# Patient Record
Sex: Male | Born: 1950 | Race: White | Hispanic: No | Smoking: Former smoker
Health system: Southern US, Community
[De-identification: ages and names within clinical notes are randomized; demographics above are authoritative.]

## PROBLEM LIST (undated history)

## (undated) DIAGNOSIS — G459 Transient cerebral ischemic attack, unspecified: Secondary | ICD-10-CM

## (undated) DIAGNOSIS — Z87442 Personal history of urinary calculi: Secondary | ICD-10-CM

## (undated) DIAGNOSIS — N1832 Chronic kidney disease, stage 3b: Secondary | ICD-10-CM

## (undated) DIAGNOSIS — Z950 Presence of cardiac pacemaker: Secondary | ICD-10-CM

## (undated) DIAGNOSIS — I10 Essential (primary) hypertension: Secondary | ICD-10-CM

## (undated) DIAGNOSIS — R519 Headache, unspecified: Secondary | ICD-10-CM

## (undated) DIAGNOSIS — E1169 Type 2 diabetes mellitus with other specified complication: Secondary | ICD-10-CM

## (undated) DIAGNOSIS — E119 Type 2 diabetes mellitus without complications: Secondary | ICD-10-CM

## (undated) DIAGNOSIS — Z9181 History of falling: Secondary | ICD-10-CM

## (undated) DIAGNOSIS — M1990 Unspecified osteoarthritis, unspecified site: Secondary | ICD-10-CM

## (undated) DIAGNOSIS — D649 Anemia, unspecified: Secondary | ICD-10-CM

## (undated) DIAGNOSIS — G4730 Sleep apnea, unspecified: Secondary | ICD-10-CM

## (undated) DIAGNOSIS — F419 Anxiety disorder, unspecified: Secondary | ICD-10-CM

## (undated) DIAGNOSIS — F1091 Alcohol use, unspecified, in remission: Secondary | ICD-10-CM

## (undated) DIAGNOSIS — F411 Generalized anxiety disorder: Secondary | ICD-10-CM

## (undated) DIAGNOSIS — K436 Other and unspecified ventral hernia with obstruction, without gangrene: Secondary | ICD-10-CM

## (undated) DIAGNOSIS — C642 Malignant neoplasm of left kidney, except renal pelvis: Secondary | ICD-10-CM

## (undated) DIAGNOSIS — C4491 Basal cell carcinoma of skin, unspecified: Secondary | ICD-10-CM

## (undated) DIAGNOSIS — H9190 Unspecified hearing loss, unspecified ear: Secondary | ICD-10-CM

## (undated) DIAGNOSIS — G629 Polyneuropathy, unspecified: Secondary | ICD-10-CM

## (undated) DIAGNOSIS — Z9981 Dependence on supplemental oxygen: Secondary | ICD-10-CM

## (undated) DIAGNOSIS — F32A Depression, unspecified: Secondary | ICD-10-CM

## (undated) DIAGNOSIS — Z87891 Personal history of nicotine dependence: Secondary | ICD-10-CM

## (undated) DIAGNOSIS — E785 Hyperlipidemia, unspecified: Secondary | ICD-10-CM

## (undated) HISTORY — DX: History of falling: Z91.81

## (undated) HISTORY — DX: Morbid (severe) obesity due to excess calories: E66.01

## (undated) HISTORY — DX: Polyneuropathy, unspecified: G62.9

## (undated) HISTORY — DX: Other and unspecified ventral hernia with obstruction, without gangrene: K43.6

## (undated) HISTORY — DX: Alcohol use, unspecified, in remission: F10.91

## (undated) HISTORY — DX: Type 2 diabetes mellitus with other specified complication: E11.69

## (undated) HISTORY — DX: Anxiety disorder, unspecified: F41.9

## (undated) HISTORY — DX: Essential (primary) hypertension: I10

## (undated) HISTORY — DX: Generalized anxiety disorder: F41.1

## (undated) HISTORY — DX: Sleep apnea, unspecified: G47.30

## (undated) HISTORY — DX: Unspecified hearing loss, unspecified ear: H91.90

## (undated) HISTORY — DX: Type 2 diabetes mellitus without complications: E11.9

## (undated) HISTORY — PX: BREAST SURGERY: SHX581

## (undated) HISTORY — DX: Dependence on supplemental oxygen: Z99.81

## (undated) HISTORY — DX: Type 2 diabetes mellitus with other specified complication: E78.5

## (undated) HISTORY — DX: Depression, unspecified: F32.A

## (undated) HISTORY — DX: Personal history of nicotine dependence: Z87.891

## (undated) HISTORY — PX: KNEE SURGERY: SHX244

---

## 1978-02-19 HISTORY — PX: APPENDECTOMY: SHX54

## 1980-02-20 HISTORY — PX: NASAL SEPTUM SURGERY: SHX37

## 1981-02-19 HISTORY — PX: VARICOCELECTOMY: SHX1084

## 1987-02-20 HISTORY — PX: COLOSTOMY: SHX63

## 1997-02-19 DIAGNOSIS — F1091 Alcohol use, unspecified, in remission: Secondary | ICD-10-CM

## 1997-02-19 DIAGNOSIS — Z87891 Personal history of nicotine dependence: Secondary | ICD-10-CM

## 1997-02-19 HISTORY — DX: Alcohol use, unspecified, in remission: F10.91

## 1997-02-19 HISTORY — DX: Personal history of nicotine dependence: Z87.891

## 1997-06-21 ENCOUNTER — Encounter: Admission: RE | Admit: 1997-06-21 | Discharge: 1997-09-19 | Payer: Self-pay | Admitting: Internal Medicine

## 2003-05-11 ENCOUNTER — Emergency Department (HOSPITAL_COMMUNITY): Admission: EM | Admit: 2003-05-11 | Discharge: 2003-05-12 | Payer: Self-pay | Admitting: Emergency Medicine

## 2003-05-13 ENCOUNTER — Encounter: Payer: Self-pay | Admitting: Urology

## 2003-05-16 ENCOUNTER — Observation Stay (HOSPITAL_COMMUNITY): Admission: EM | Admit: 2003-05-16 | Discharge: 2003-05-17 | Payer: Self-pay | Admitting: Emergency Medicine

## 2005-02-18 ENCOUNTER — Inpatient Hospital Stay (HOSPITAL_COMMUNITY): Admission: EM | Admit: 2005-02-18 | Discharge: 2005-02-19 | Payer: Self-pay | Admitting: Emergency Medicine

## 2008-02-11 ENCOUNTER — Encounter (HOSPITAL_COMMUNITY): Admission: RE | Admit: 2008-02-11 | Discharge: 2008-02-19 | Payer: Self-pay | Admitting: Cardiology

## 2008-02-20 ENCOUNTER — Encounter (HOSPITAL_COMMUNITY): Admission: RE | Admit: 2008-02-20 | Discharge: 2008-02-20 | Payer: Self-pay | Admitting: Cardiology

## 2008-04-08 ENCOUNTER — Ambulatory Visit (HOSPITAL_COMMUNITY): Admission: RE | Admit: 2008-04-08 | Discharge: 2008-04-08 | Payer: Self-pay | Admitting: Urology

## 2008-04-12 ENCOUNTER — Ambulatory Visit (HOSPITAL_COMMUNITY): Admission: RE | Admit: 2008-04-12 | Discharge: 2008-04-12 | Payer: Self-pay | Admitting: Urology

## 2010-03-12 ENCOUNTER — Encounter: Payer: Self-pay | Admitting: Internal Medicine

## 2010-06-06 LAB — GLUCOSE, CAPILLARY: Glucose-Capillary: 160 mg/dL — ABNORMAL HIGH (ref 70–99)

## 2010-06-06 LAB — URINALYSIS, ROUTINE W REFLEX MICROSCOPIC
Bilirubin Urine: NEGATIVE
Hgb urine dipstick: NEGATIVE
Nitrite: NEGATIVE
Specific Gravity, Urine: 1.019 (ref 1.005–1.030)
pH: 7.5 (ref 5.0–8.0)

## 2010-06-06 LAB — CBC
HCT: 37.2 % — ABNORMAL LOW (ref 39.0–52.0)
Hemoglobin: 12.3 g/dL — ABNORMAL LOW (ref 13.0–17.0)
RBC: 4.3 MIL/uL (ref 4.22–5.81)
RDW: 14.9 % (ref 11.5–15.5)
WBC: 5.2 10*3/uL (ref 4.0–10.5)

## 2010-06-06 LAB — URINE MICROSCOPIC-ADD ON

## 2010-06-06 LAB — COMPREHENSIVE METABOLIC PANEL
BUN: 11 mg/dL (ref 6–23)
CO2: 25 mEq/L (ref 19–32)
Chloride: 105 mEq/L (ref 96–112)
Glucose, Bld: 151 mg/dL — ABNORMAL HIGH (ref 70–99)
Potassium: 5.8 mEq/L — ABNORMAL HIGH (ref 3.5–5.1)
Sodium: 135 mEq/L (ref 135–145)
Total Protein: 7 g/dL (ref 6.0–8.3)

## 2010-07-04 NOTE — Op Note (Signed)
NAME:  Jesse [name], Jesse [name]              ACCOUNT NO.:  [id number]   MEDICAL RECORD NO.:  [id number]          PATIENT TYPE:  AMB   LOCATION:  DAY                          FACILITY:  [facility]   PHYSICIAN:  Houston M. Kimbrough, M.D.DATE OF BIRTH:  Nov 21, 1950   DATE OF PROCEDURE:  04/08/2008  DATE OF DISCHARGE:                               OPERATIVE REPORT   PREOPERATIVE DIAGNOSIS:  Large 21 mm stone left UPJ (ureteropelvic  junction) with obstruction.   POSTOPERATIVE DIAGNOSIS:  Large 21 mm stone left UPJ (ureteropelvic  junction) with obstruction.   OPERATION:  Cystoscopy, left retrograde pyelogram, left ureteroscopy  with laser tripsy, left ureteral stent insertion.   ANESTHESIA:  General.   SURGEON:  Corky Downs, M.D.   BRIEF HISTORY:  This 60 year old patient comes in with a large stone at  the left UPJ that was found on a CT scan done a few days ago.  He has  had intermittent left pain.  He had ureteroscopy and laser tripsy by Dr.  Risa Grill in March of 2005.  He has also had lithotripsy 3 or 4 times in  the past.  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  He has an incarcerated left ventral hernia  that is quite large as well.  He enters now for cystoscopy, retrograde  and attempted laser tripsy of the large left UPJ stone and insertion of  a stent.   PROCEDURE IN DETAIL:  The patient was placed on the operating table in  dorsal lithotomy position.  After satisfactory induction of general  endotracheal anesthesia was prepped and draped with Betadine in the  usual sterile fashion and given IV Cipro.  Time-out was then performed  with the patient and the procedure then reidentified and confirmed.  The  21 panendoscope was inserted in the anterior urethra which was normal.  Posterior urethra showed some bilobar prostate enlargement.  The bladder  was entered.  It was carefully inspected.  No lesions were seen.  The  left orifice was  visualized and catheterized with an open-ended ureteral  catheter.   An occlusive retrograde demonstrated normal distal ureter but a large  stone at the AP junction that was quite tight.  I was able to get some  dye through this.   Under fluoroscopy I passed a sensor guidewire 0.38 and I was able to  negotiate this past the stone.  Scope was then removed and the ureter  was then gently dilated with the inner cannula from a ureteral access  sheath under fluoroscopy up to the level of the stone.  The inner  cannula was then pulled back out and loaded over the long ureteral  access sheath and then again under fluoroscopy I was able to gently pass  this up to the stone.  I removed the guidewire and the stylette.   I passed the flexible ureteroscope up to the stone.  I could see the  stone and then with the 200 micron fiber of the Holmium laser I was able  to start working on the stone.  It  was difficult to get very good flow  even with compression and it was hard to direct the laser fiber directly  on the stone.  I was able to chip away at part of the stone with the  fiber but I did not feel it was safe to continue not being able to see  any better than I could.  I removed the flexible cystoscope and passed  another sensor guidewire under fluoroscopy past the stone and then  removed the ureteral access sheath.  The cystoscope was then back loaded  over the guidewire and then a 6 French x 28 cm length double-J ureteral  stent was then passed over the guidewire under fluoroscopy up to the  kidney.  When I removed the guidewire it seemed like the stent was just  was too long and it was kind of coiled up around the stone.  I grasped  the end of the stent, pulled this outside the urethral meatus and then  passed a new guidewire under fluoroscopy again past the stone up into  the kidney.  I removed the old stent.  I back loaded the cystoscope over  the guidewire and this time a 6 French x 26 cm  length double-J ureteral  stent was then passed and it seemed to fit much better.  When the  guidewire was removed there was a coil in the renal pelvis and one also  in the bladder.  The scope was removed after draining the bladder.  The  patient was given 30 mg of Toradol and a B and O suppository.   The patient was taken to the recovery room in good condition and will be  set up for lithotripsy for later next week.  At least with the stent we  will be able to break the stone up and he will not pass all the pieces  at one time. He tolerated the procedure well.  He will be sent home as  an outpatient.      Corky Downs, M.D.  Electronically Signed     HMK/MEDQ  D:  04/08/2008  T:  04/08/2008  Job:  [id number]

## 2010-07-07 NOTE — H&P (Signed)
NAME:  Jesse [name], Jesse [name]              ACCOUNT NO.:  [id number]   MEDICAL RECORD NO.:  [id number]          PATIENT TYPE:  INP   LOCATION:  0103                         FACILITY:  [facility]   PHYSICIAN:  Darrelyn Hillock, MDDATE OF BIRTH:  1950/03/28   DATE OF ADMISSION:  02/17/2005  DATE OF DISCHARGE:                                HISTORY & PHYSICAL   ADMISSION DIAGNOSIS:  Partial small bowel obstruction and ventral hernia  with loss of domain.   ADMITTING PHYSICIAN:  Dr. Janeece Agee.   HISTORY OF PRESENT ILLNESS:  The patient is a 60 year old white male who is  at least 15 or 18 years status post ruptured diverticulitis with [place]  procedure status post colostomy reversal with long-standing ventral hernia,  now with increasing symptoms of nausea and abdominal discomfort.  Abdominal  series is consistent with a partial small bowel obstruction.  The patient  has actually had improvement in his symptoms and abdominal pain while in the  emergency room, however does have continued discomfort.  The patient was  seen in our office about a year ago and told by one of the surgeons that he  had loss of domain of his ventral hernia, really needed to lose a  significant amount of weight prior to attempting ventral hernia repair.   PAST MEDICAL HISTORY:  1.  Obstructive sleep apnea  2.  Noninsulin-dependent diabetes mellitus.  3.  Depression.   PAST SURGICAL HISTORY:  1.  Significant as above.  2.  Foot surgery.  3  Appendectomy.  4  Hartman procedure and reversal of colostomy.   MEDICATIONS:  Aspirin 325 mg a day,  __________ 2 mg q day, Actos 15 mg a  day, metformin 1 g every morning and every evening, Byetta 10 mg a day,  Wellbutrin 400 mg a day, Benicar 20 mg a day, cephalexin 500 mg a day.   PHYSICAL EXAMINATION:  He is an age appropriate white male in minimal  distress.  HEENT:  Benign.  Normocephalic, atraumatic.  Pupils equal, round, and  reactive to light.  LUNGS:   Clear to auscultation and percussion x2.  HEART:  Regular rate and rhythm without murmurs, rubs or gallops.  ABDOMEN:  Morbidly obese with a very large left lower quadrant hernia and  midline scar.  The left lower quadrant hernia, however, is somewhat soft.  The upper portion is somewhat firm with no erythema.  Minimally tender.  The  upper portion is not reducible.  The midline scar is slightly tender as  well.  EXTREMITIES:  No cyanosis, clubbing or edema.   IMPRESSION:  Long-standing ventral hernia with incarceration and mild small  bowel obstruction.   PLAN:  Admission, NG tube decompression, and followup abdominal films.      Darrelyn Hillock, MD  Electronically Signed     KRH/MEDQ  D:  02/18/2005  T:  02/18/2005  Job:  [id number]

## 2010-07-07 NOTE — Op Note (Signed)
NAME:  Jesse [name], Jesse [name]                        ACCOUNT NO.:  [id number]   MEDICAL RECORD NO.:  [id number]                   PATIENT TYPE:  OUT   LOCATION:  DAY                                  FACILITY:  [facility]   PHYSICIAN:  Bernestine Amass, M.D.               DATE OF BIRTH:  04/27/1950   DATE OF PROCEDURE:  05/17/2003  DATE OF DISCHARGE:                                 OPERATIVE REPORT   PREOPERATIVE DIAGNOSES:  Left proximal ureteral calculus.   POSTOPERATIVE DIAGNOSES:  Left proximal ureteral calculus.   PROCEDURE:  Cystoscopy, left retrograde pyelography, left ureteroscopy,  holmium laser lithotripsy, basketing of stone fragments and double J stent  placement.   SURGEON:  Bernestine Amass, M.D.   ANESTHESIA:  Spinal.   INDICATIONS FOR PROCEDURE:  Jesse [name] is a 60 year old male.  He recently  presented with severe left flank pain and was diagnosed with a 6 mm stone  right at his left ureteropelvic junction/UPJ region.  When we saw him in the  office, he was continuing to have discomfort.  A KUB showed a very faint  calcification. The patient is morbidly obese at over 350 pounds and  therefore visualization is somewhat difficult.  A CT had confirmed again a 6  mm stone in that location with moderate hydronephrosis.  The patient  requested intervention as possible.  We initially had hoped to perform  lithotripsy but given the very faint nature of the stone as well as his  significant obesity, we felt it could be very difficult to visualize the  stone.  In addition, there was really no slots available for lithotripsy for  approximately two weeks.  For that reason, we felt attempt at ureteroscopy  with at least stent placement to unobstructed kidney would be prudent. We  planned on doing this as an outpatient today but over the weekend, the  patient had severe pain and had to be admitted by Dr. Gaynelle Arabian for pain  control.  He now presents for definitive treatment.   TECHNIQUE AND FINDINGS:  The patient was brought to the operating room. He  had a successful spinal anesthetic and was placed in lithotomy position,  prepped and draped in the usual manner. The patient had mild to moderate  trilobar hyperplasia with a very prominent and high riding median bar.  The  bladder was otherwise endoscopically unremarkable. Retrograde pyelogram  confirmed a high grade obstruction in the proximal ureter. A Glidewire was  able to be passed beyond this without too much difficulty. I used the inside  portion of an access sheath to provide one step dilation of the distal  ureter.  Once that was accomplished, a long 6 French ureteroscope was  inserted. As we got to the proximal ureter, a 6 mm stone was encountered. We  initially attempted to basket extract it but as we got to the distal ureter,  the stone became too snug.  For that reason, the basket was left in situ with  the stone and we went back up with the ureteroscope and used the holmium  laser fiber to break the stone into 10 or 12 pieces. The stone was very hard  consistent with calcium oscillate monohydrate.  Once the fragments were  broken up, we were able to remove the basket with some remaining pieces. We  then used a new basket to remove 3 or 4 additional pieces that were 3-4 mm  in size.  At the completion of the procedure, we saw no significant residual  fragments within the ureter. Because of the need for dilation as well as the  manipulation, we felt it prudent to leave a  double J stent in for 5-7 days. The Glidewire was confirmed to be in good  position and over the wire we placed a 6 French 24 cm stent without  difficulty. Good position was confirmed.  The bladder was drained. The  patient had a B&O suppository and some lidocaine jelly. He was brought to  the recovery room in stable condition.                                               Bernestine Amass, M.D.    DSG/MEDQ  D:  05/17/2003  T:   05/17/2003  Job:  [id number]

## 2010-07-07 NOTE — Discharge Summary (Signed)
Jesse [name], Jesse [name]              ACCOUNT NO.:  [id number]   MEDICAL RECORD NO.:  [id number]          PATIENT TYPE:  INP   LOCATION:  [id number]                         FACILITY:  [facility]   PHYSICIAN:  Jonne Ply, MD   DATE OF BIRTH:  1950-04-13   DATE OF ADMISSION:  02/17/2005  DATE OF DISCHARGE:  02/19/2005                                 DISCHARGE SUMMARY   ADMISSION DIAGNOSIS:  Small-bowel obstruction with ventral hernia.   DISCHARGE DIAGNOSIS:  Small-bowel obstruction with ventral hernia,  spontaneously improved.   CONDITION ON DISCHARGE:  Good and improved. Follow up is with me p.r.n..   HOSPITAL COURSE:  The patient was admitted with evidence of a partial small-  bowel obstruction and a somewhat incarcerated left lower quadrant ventral  hernia. He had significant loss of domain from this hernia and the length of  time it was in place. The patient was admitted, NG tube was placed.  By the  following morning the patient had complete resolution of his symptoms and no  further firmness, minimal NG output, NG tube was removed.  He was started on  a regular diet and ready for discharge home.      Jonne Ply, MD  Electronically Signed     KRE/MEDQ  D:  03/28/2005  T:  03/28/2005  Job:  [id number]

## 2015-02-20 HISTORY — PX: COLOSTOMY: SHX63

## 2015-02-23 DIAGNOSIS — K56609 Unspecified intestinal obstruction, unspecified as to partial versus complete obstruction: Secondary | ICD-10-CM | POA: Insufficient documentation

## 2015-02-26 DIAGNOSIS — I491 Atrial premature depolarization: Secondary | ICD-10-CM | POA: Insufficient documentation

## 2015-03-07 DIAGNOSIS — D649 Anemia, unspecified: Secondary | ICD-10-CM | POA: Insufficient documentation

## 2015-03-07 DIAGNOSIS — N189 Chronic kidney disease, unspecified: Secondary | ICD-10-CM | POA: Insufficient documentation

## 2015-03-07 DIAGNOSIS — E860 Dehydration: Secondary | ICD-10-CM | POA: Insufficient documentation

## 2015-03-07 DIAGNOSIS — K439 Ventral hernia without obstruction or gangrene: Secondary | ICD-10-CM | POA: Insufficient documentation

## 2015-04-01 DIAGNOSIS — L0291 Cutaneous abscess, unspecified: Secondary | ICD-10-CM | POA: Insufficient documentation

## 2020-11-25 DIAGNOSIS — I890 Lymphedema, not elsewhere classified: Secondary | ICD-10-CM

## 2020-11-25 DIAGNOSIS — N183 Chronic kidney disease, stage 3 unspecified: Secondary | ICD-10-CM

## 2020-11-25 DIAGNOSIS — I1310 Hypertensive heart and chronic kidney disease without heart failure, with stage 1 through stage 4 chronic kidney disease, or unspecified chronic kidney disease: Secondary | ICD-10-CM

## 2020-11-25 DIAGNOSIS — E1151 Type 2 diabetes mellitus with diabetic peripheral angiopathy without gangrene: Secondary | ICD-10-CM

## 2020-11-25 DIAGNOSIS — L97811 Non-pressure chronic ulcer of other part of right lower leg limited to breakdown of skin: Secondary | ICD-10-CM

## 2020-11-25 DIAGNOSIS — E1122 Type 2 diabetes mellitus with diabetic chronic kidney disease: Secondary | ICD-10-CM

## 2020-11-25 DIAGNOSIS — Z4800 Encounter for change or removal of nonsurgical wound dressing: Secondary | ICD-10-CM

## 2020-11-25 DIAGNOSIS — I872 Venous insufficiency (chronic) (peripheral): Secondary | ICD-10-CM

## 2020-12-08 ENCOUNTER — Encounter: Payer: Self-pay | Admitting: Family

## 2020-12-08 ENCOUNTER — Other Ambulatory Visit: Payer: Self-pay

## 2020-12-08 ENCOUNTER — Ambulatory Visit (INDEPENDENT_AMBULATORY_CARE_PROVIDER_SITE_OTHER): Payer: Medicare PPO | Admitting: Family

## 2020-12-08 VITALS — BP 128/60 | HR 62 | Temp 97.5°F | Resp 20 | Ht 72.0 in | Wt 380.0 lb

## 2020-12-08 DIAGNOSIS — Z1159 Encounter for screening for other viral diseases: Secondary | ICD-10-CM

## 2020-12-08 DIAGNOSIS — M25562 Pain in left knee: Secondary | ICD-10-CM

## 2020-12-08 DIAGNOSIS — E1142 Type 2 diabetes mellitus with diabetic polyneuropathy: Secondary | ICD-10-CM

## 2020-12-08 DIAGNOSIS — F411 Generalized anxiety disorder: Secondary | ICD-10-CM

## 2020-12-08 DIAGNOSIS — F321 Major depressive disorder, single episode, moderate: Secondary | ICD-10-CM

## 2020-12-08 DIAGNOSIS — G4733 Obstructive sleep apnea (adult) (pediatric): Secondary | ICD-10-CM

## 2020-12-08 DIAGNOSIS — G8929 Other chronic pain: Secondary | ICD-10-CM

## 2020-12-08 DIAGNOSIS — E785 Hyperlipidemia, unspecified: Secondary | ICD-10-CM

## 2020-12-08 DIAGNOSIS — R0609 Other forms of dyspnea: Secondary | ICD-10-CM

## 2020-12-08 DIAGNOSIS — I129 Hypertensive chronic kidney disease with stage 1 through stage 4 chronic kidney disease, or unspecified chronic kidney disease: Secondary | ICD-10-CM | POA: Diagnosis not present

## 2020-12-08 DIAGNOSIS — E119 Type 2 diabetes mellitus without complications: Secondary | ICD-10-CM | POA: Insufficient documentation

## 2020-12-08 DIAGNOSIS — F5101 Primary insomnia: Secondary | ICD-10-CM

## 2020-12-08 DIAGNOSIS — F1091 Alcohol use, unspecified, in remission: Secondary | ICD-10-CM

## 2020-12-08 DIAGNOSIS — Z87891 Personal history of nicotine dependence: Secondary | ICD-10-CM

## 2020-12-08 DIAGNOSIS — R2681 Unsteadiness on feet: Secondary | ICD-10-CM

## 2020-12-08 DIAGNOSIS — N183 Chronic kidney disease, stage 3 unspecified: Secondary | ICD-10-CM

## 2020-12-08 DIAGNOSIS — E1169 Type 2 diabetes mellitus with other specified complication: Secondary | ICD-10-CM | POA: Diagnosis not present

## 2020-12-08 DIAGNOSIS — F419 Anxiety disorder, unspecified: Secondary | ICD-10-CM | POA: Insufficient documentation

## 2020-12-08 DIAGNOSIS — Z7689 Persons encountering health services in other specified circumstances: Secondary | ICD-10-CM

## 2020-12-08 DIAGNOSIS — G63 Polyneuropathy in diseases classified elsewhere: Secondary | ICD-10-CM

## 2020-12-08 DIAGNOSIS — M25561 Pain in right knee: Secondary | ICD-10-CM

## 2020-12-08 DIAGNOSIS — R600 Localized edema: Secondary | ICD-10-CM

## 2020-12-08 DIAGNOSIS — H903 Sensorineural hearing loss, bilateral: Secondary | ICD-10-CM

## 2020-12-08 DIAGNOSIS — Z933 Colostomy status: Secondary | ICD-10-CM

## 2020-12-08 DIAGNOSIS — L989 Disorder of the skin and subcutaneous tissue, unspecified: Secondary | ICD-10-CM

## 2020-12-08 DIAGNOSIS — H9190 Unspecified hearing loss, unspecified ear: Secondary | ICD-10-CM | POA: Insufficient documentation

## 2020-12-08 NOTE — Progress Notes (Addendum)
Provider: Marlowe Sax FNP-C   Jahmai Finelli, Nelda Bucks, NP  Patient Care Team: Shanti Agresti, Nelda Bucks, NP as PCP - General (Family Medicine)  Extended Emergency Contact Information Primary Emergency Contact: Reasner,MICHAELINE W Address: [place], [place] [address] Johnnette Litter of [place] Phone: [phone] Mobile Phone: [phone] Relation: Friend Secondary Emergency Contact: Harada,[name] Mobile Phone: [phone] Relation: Other Preferred language: English Interpreter needed? No  Code Status:  Full Code  Goals of care: Advanced Directive information Advanced Directives 12/08/2020  Does Patient Have a Medical Advance Directive? Yes  Type of Paramedic of [place];Living will  Does patient want to make changes to medical advance directive? No - Patient declined  Copy of [place] in Chart? No - copy requested     Chief Complaint  Patient presents with   Establish Care    New Patient.    HPI:  Pt is a 70 y.o. male seen today to establish care here at [facility], systems for medical management of chronic diseases.Has medical history Type 2 DM with peripheral Neuropathy,Hypertension, CKD stage 3 a,Hyperlipidemia,sleep Apnea,Peripheral Neuropathy,Oxygen dependent ,Generalized Anxiety disorder, Major Depression, Morbid Obesity,Hear of hearing,Fall risk ,Former cigarette smoker,Alcohol use in remission,Lymphedema,severe cleft foot  among others.  States has blisters that opened up on left leg wound managed by [facility] dressing changed twice per week. He uses continuous  oxygen 2 liters via nasal cannula. Gets supplies from [place].  Aortic root  - 5.0 in the past but now measures 4.9   Hypertension - blood pressure reading are in the 120/70's denies any headache,dizziness,vision changes,fatigue,chest tightness,palpitation.occasional chest pain on isobid.   Type 2 DM - states  A1C 5.9 done few weeks ago.does not check blood sugar on a regular basis.Has not had any hypoglycemia.  Hyperlipidemia - latest LDL was normal few weeks ago.will need to obtain previous medical records to evaluate labs.  Sleep Apnea - wears CPAP with 14 pressure and oxygen   Lower extremities edema - chronic lymphedema   Generalized Anxiety - stable   Depression - has been on Cymbalta has been effective   Smoked 1 pack per day for 10 yrs quit in 1999  Used to drink alcohol 1/5 day last drink 1999  Does little exercise due to shortness of breath on exertion.  Insomnia - sometimes does not sleep until 3 am.Takes Alprazolam which has been effective.  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   He will require DME power wheelchair to allow him to maintain current level of independence with his ADL's which cannot be achieved with standard wheelchair,walker or cane.Scooter is not an option for patient due to inability to transfer on and off given his morbid obesity very high risk for falls.Patient suffers from Morbid obesity,Lymphedema,chronic pain on both knees-Osteoarthritis ,dyspnea on exertion which impairs his ability to perform daily activities like bathing,walking,dressing,grooming and toileting in the home.He is alert and oriented and capbable of operating power wheelchair by himself.  Fall and safety precautions.   Past Medical History:  Diagnosis Date   Alcohol use disorder in remission    Anxiety and depression    At high risk for falls    Former cigarette smoker    Generalized anxiety disorder    Hard of hearing    Hyperlipidemia due to type 2 diabetes mellitus ([place])  Hypertension    Morbid obesity ([place])    Neuropathy    O2 dependent    Risk for falls    Sleep apnea    Type 2 diabetes mellitus ([place])    Past Surgical History:  Procedure Laterality Date   APPENDECTOMY  1980   KNEE SURGERY Right      Allergies  Allergen Reactions   Tape     Allergies as of 12/08/2020       Reactions   Tape         Medication List        Accurate as of December 08, 2020 11:59 PM. If you have any questions, ask your nurse or doctor.          ALPRAZolam 0.5 MG tablet Commonly known as: XANAX Take 0.5 mg by mouth daily as needed.   aspirin EC 81 MG tablet Take 81 mg by mouth daily. Swallow whole.   bumetanide 1 MG tablet Commonly known as: BUMEX Take 1 tablet by mouth in the morning, at noon, in the evening, and at bedtime.   cyanocobalamin 1000 MCG tablet Take 1,000 mcg by mouth daily.   DULoxetine 60 MG capsule Commonly known as: CYMBALTA Take 60 mg by mouth daily.   empagliflozin 25 MG Tabs tablet Commonly known as: JARDIANCE Take 25 mg by mouth daily.   IMODIUM PO Take 1 capsule by mouth daily.   ketoconazole 2 % cream Commonly known as: NIZORAL Apply 1 application topically as needed for irritation.   liraglutide 18 MG/3ML Sopn Commonly known as: VICTOZA Inject 1.8 mg into the skin daily.   metFORMIN 500 MG tablet Commonly known as: GLUCOPHAGE Take 500 mg by mouth 2 (two) times daily with a meal.   metolazone 2.5 MG tablet Commonly known as: ZAROXOLYN Take 2.5 mg by mouth 2 (two) times a week.   pioglitazone 45 MG tablet Commonly known as: ACTOS Take 1 tablet by mouth daily.   RED YEAST RICE PO Take by mouth daily.   rosuvastatin 10 MG tablet Commonly known as: CRESTOR Take 1 tablet by mouth daily.   topiramate 25 MG tablet Commonly known as: TOPAMAX Take 25 mg by mouth daily.   traMADol 50 MG tablet Commonly known as: ULTRAM Take 50 mg by mouth 2 (two) times daily as needed.   traZODone 50 MG tablet Commonly known as: DESYREL Take 1 tablet by mouth 3 (three) times daily.   TURMERIC PO Take 150 mg by mouth 2 (two) times daily.   valsartan 40 MG tablet Commonly known as: DIOVAN Take 40 mg by mouth daily.                Durable Medical Equipment  (From admission, onward)           Start     Ordered   12/08/20 0000  For home use only DME Other see comment       Comments: Power wheelchair uses daily for mobility  Question:  Length of Need  Answer:  Lifetime   12/08/20 1445            Review of Systems  Constitutional:  Positive for fatigue. Negative for appetite change, chills, fever and unexpected weight change.  HENT:  Positive for hearing loss. Negative for congestion, dental problem, ear discharge, ear pain, facial swelling, nosebleeds, postnasal drip, rhinorrhea, sinus pressure, sinus pain, sneezing, sore throat, tinnitus and trouble swallowing.        Dry mouth   Eyes:  Positive  for visual disturbance. Negative for pain, discharge, redness and itching.       Wears corrective lens   Respiratory:  Positive for shortness of breath. Negative for cough, chest tightness and wheezing.        Chronic shortness of breath on oxygen 2 liters  Uses CPAP 14 pressure with oxygen   Cardiovascular:  Positive for leg swelling. Negative for chest pain and palpitations.  Gastrointestinal:  Negative for abdominal distention, abdominal pain, blood in stool, constipation, diarrhea, nausea and vomiting.  Endocrine: Negative for cold intolerance, heat intolerance, polydipsia, polyphagia and polyuria.  Genitourinary:  Positive for frequency. Negative for difficulty urinating, dysuria, flank pain and urgency.       Frequency voiding due to diuretics  Musculoskeletal:  Positive for arthralgias and joint swelling. Negative for back pain, gait problem, myalgias, neck pain and neck stiffness.       Deformities on feet   Skin:  Positive for rash. Negative for color change, pallor and wound.       Had basal cell 3 X  Itchy skin  Bruising with slight petechiae   Neurological:  Positive for headaches. Negative for dizziness, syncope, speech difficulty, weakness, light-headedness and numbness.       Chronic headaches    Hematological:  Does not bruise/bleed easily.  Psychiatric/Behavioral:  Positive for sleep disturbance. Negative for agitation, behavioral problems, confusion, hallucinations, self-injury and suicidal ideas. The patient is nervous/anxious.        Depression    Immunization History  Administered Date(s) Administered   H1N1 01/24/2008   Moderna Sars-Covid-2 Vaccination 04/03/2019, 05/05/2019, 12/20/2019, 05/23/2020   Zoster Recombinat (Shingrix) 03/01/2020   Pertinent  Health Maintenance Due  Topic Date Due   HEMOGLOBIN A1C  Never done   FOOT EXAM  Never done   OPHTHALMOLOGY EXAM  Never done   INFLUENZA VACCINE  Never done   COLONOSCOPY (Pts 45-54yr Insurance coverage will need to be confirmed)  Discontinued   Fall Risk  12/08/2020  Falls in the past year? 0  Number falls in past yr: 0  Injury with Fall? 0  Risk for fall due to : No Fall Risks  Follow up Falls evaluation completed   Functional Status Survey:    Vitals:   12/08/20 1330  BP: 128/60  Pulse: 62  Resp: 20  Temp: (!) 97.5 F (36.4 C)  SpO2: 98%  Weight: (!) 380 lb (172.4 kg)  Height: 6' (1.829 m)   Body mass index is 51.54 kg/m. Physical Exam Vitals reviewed.  Constitutional:      General: He is not in acute distress.    Appearance: Normal appearance. He is morbidly obese. He is not ill-appearing or diaphoretic.  HENT:     Head: Normocephalic.     Right Ear: Tympanic membrane, ear canal and external ear normal. There is no impacted cerumen.     Left Ear: Tympanic membrane, ear canal and external ear normal. There is no impacted cerumen.     Ears:     Comments: Hard of hearing.Bilateral Hearing aids in place     Nose: Nose normal. No congestion or rhinorrhea.     Mouth/Throat:     Mouth: Mucous membranes are moist.     Pharynx: Oropharynx is clear. No oropharyngeal exudate or posterior oropharyngeal erythema.  Eyes:     General: No scleral icterus.       Right eye: No discharge.        Left eye:  No discharge.  Extraocular Movements: Extraocular movements intact.     Conjunctiva/sclera: Conjunctivae normal.     Pupils: Pupils are equal, round, and reactive to light.  Neck:     Vascular: No carotid bruit.  Cardiovascular:     Rate and Rhythm: Normal rate and regular rhythm.     Pulses: Normal pulses.     Heart sounds: Normal heart sounds. No murmur heard.   No friction rub. No gallop.  Pulmonary:     Effort: Pulmonary effort is normal. No respiratory distress.     Breath sounds: Normal breath sounds. No wheezing, rhonchi or rales.  Chest:     Chest wall: No tenderness.  Abdominal:     General: Bowel sounds are normal. There is no distension.     Palpations: Abdomen is soft. There is no mass.     Tenderness: There is no abdominal tenderness. There is no right CVA tenderness, left CVA tenderness, guarding or rebound.  Musculoskeletal:        General: No swelling or tenderness. Normal range of motion.     Cervical back: Normal range of motion. No rigidity or tenderness.     Right lower leg: Edema present.     Left lower leg: Edema present.     Right foot: Deformity present.     Left foot: Deformity present.  Lymphadenopathy:     Cervical: No cervical adenopathy.  Skin:    General: Skin is warm and dry.     Coloration: Skin is not pale.     Findings: No bruising, erythema or rash.     Comments: Skin lesion on the back   Neurological:     Mental Status: He is alert and oriented to person, place, and time.     Cranial Nerves: No cranial nerve deficit.     Sensory: No sensory deficit.     Motor: No weakness.     Coordination: Coordination normal.     Gait: Gait abnormal.  Psychiatric:        Mood and Affect: Mood normal.        Speech: Speech normal.        Behavior: Behavior normal.        Thought Content: Thought content normal.        Judgment: Judgment normal.    Labs reviewed: No results for input(s): NA, K, CL, CO2, GLUCOSE, BUN, CREATININE, CALCIUM, MG,  PHOS in the last 8760 hours. No results for input(s): AST, ALT, ALKPHOS, BILITOT, PROT, ALBUMIN in the last 8760 hours. No results for input(s): WBC, NEUTROABS, HGB, HCT, MCV, PLT in the last 8760 hours. No results found for: TSH No results found for: HGBA1C No results found for: CHOL, HDL, LDLCALC, LDLDIRECT, TRIG, CHOLHDL  Significant Diagnostic Results in last 30 days:  No results found.  Assessment/Plan 1. Diabetic peripheral neuropathy (HCC) No CBG log for evaluation reports latest Hgb A 1 C was 5.9  Reports no signs of hypo/hyperglycemia.will obtain medical records to evaluate labs  Continue on Victoza, Jardiance ,Actos and Metformin  - continue on ASA and Statin for cardiac event prophylaxis -on Valsartan for renal protection  - on ACE inhibitor for renal protection - up to date on annual eye - will need referral annual foot exam  - CBC with Differential/Platelet; Future - CMP with eGFR([facility]); Future - Hemoglobin A1c; Future - Ambulatory referral to Podiatry  2. Benign hypertension with CKD (chronic kidney disease) stage III (HCC) B/p at goal  Continue on Valsartan and Bumex  -  CBC with Differential/Platelet; Future - CMP with eGFR([facility]); Future - TSH; Future  3. Hyperlipidemia due to type 2 diabetes mellitus ([place]) No labs for review but reports had recent lab work done. Continue on Rosuvastatin  - Lipid panel; Future  4. Polyneuropathy associated with underlying disease ([place]) Continue on valsartan   5. Generalized anxiety disorder Stable - continue on alprazolam  - Non-Opoid use contract signed today    6. Current moderate episode of major depressive disorder, unspecified whether recurrent (HCC) Mood stable  - continue on Duloxetine   7. Obstructive sleep apnea syndrome wears CPAP with 14 pressure and with continuous oxygen   8. Morbid obesity ([place]) - Dietary modification and exercise at least 3 times per week for 30 minutes advised. Requires power  wheelchair due to difficulties with ambulation  - For home use only DME Other see comment: Power wheelchair   9. Unsteady gait Fall and safety precaution  - For home use only DME Other see comment: Power wheelchair ordered   10. Sensorineural hearing loss (SNHL) of both ears Bilateral Hearing Aids in place   11. Presence of colostomy (HCC) Chronic  Patent   12. Former cigarette smoker Smoked one per pack per day for 10 years quit 1999  13. Alcohol use disorder in remission Drunk 1 per 5 days quit 1999  - continue to monitor  14. Establishing care with new doctor, encounter for No medical records for review of immunization,lab work and medical history.Has signed release of information from previous PCP then will update records.reports had recent fasting lab work no labs required today.   15. Primary insomnia Continue on Trazodone   16. Encounter for hepatitis C screening test for low risk patient Low risk  - Hep C Antibody; Future  17. Edema of both lower extremities [facility] Nurse to continue Ulna boots - For home use only DME Other see comment - continue on Bumetadine and  Metolazone   18. Chronic pain of both knees Continue on tramadol  Use contract signed today   19.  Dyspnea on exertion  Chronic due to morbid obesity and lower extremities edema.will require Power wheelchair to allow him to maintain current level of independence with his ADL's which cannot be achieved with standard wheelchair,walker or cane.Scooter is not an option for patient due to inability to transfer on and off given his morbid obesity very high risk for falls.He is alert and oriented and able to operate power wheelchair and use it within the home.  - continue on Bumetadine and  Metolazone   20. Skin lesion of back Hx of basal cell cancer  - Ambulatory referral to Dermatology  Family/ staff Communication: Reviewed plan of care with patient verbalized understanding   Labs/tests  ordered:  - CBC with Differential/Platelet - CMP with eGFR([facility]) - TSH - Hgb A1C - Lipid panel - Hep C Antibody  Next Appointment : 4 months for medical management of chronic issues with Fasting Labs prior to visit    [name], NP

## 2020-12-12 ENCOUNTER — Telehealth: Payer: Self-pay | Admitting: *Deleted

## 2020-12-12 NOTE — Telephone Encounter (Signed)
Received fax from Andria Rhein from [place] requesting Power Wheelchair documentation and Standard Written Order to be faxed to: [phone].  Placed Paperwork and Order in [place] folder to review and sign. To be faxed back once completed.

## 2020-12-13 ENCOUNTER — Telehealth: Payer: Self-pay | Admitting: Family

## 2020-12-13 NOTE — Telephone Encounter (Signed)
Mr Triggs called requesting 2 referrals: Dermatologist for an area on his back that's itchy  Podiatry for an area on toe that's causing him pain.  Thanks, Vilinda Blanks.

## 2020-12-15 NOTE — Telephone Encounter (Signed)
Ordered

## 2020-12-16 ENCOUNTER — Telehealth: Payer: Self-pay | Admitting: *Deleted

## 2020-12-16 NOTE — Telephone Encounter (Signed)
[facility] Notified.

## 2020-12-16 NOTE — Telephone Encounter (Signed)
Jesse [name] with [facility] called requesting verbal orders for Skilled Nursing 2X4weeks for Leg Wraps.   Is this ok Please Advise.

## 2020-12-16 NOTE — Telephone Encounter (Signed)
Renee with [facility] called and stated that she just wanted to let you know that patient fell off of his Rollator yesterday.   Stated that he did not hit his head. Stated that he has some bruising and pain on his left hip, elbow and shoulder.   EMS did come and help patient up and assessed him.    FYI

## 2020-12-16 NOTE — Telephone Encounter (Signed)
Okay to give home health verbal orders.

## 2020-12-20 NOTE — Telephone Encounter (Signed)
[name] signed the Standard Written Order for patient's Power [facility] Chair.  Faxed the Order along with the OV notes to Fax [phone] [facility]

## 2020-12-21 ENCOUNTER — Telehealth: Payer: Self-pay | Admitting: Family

## 2020-12-21 NOTE — Telephone Encounter (Signed)
   BURNEY CALZADILLA DOB: 1950/12/04 MRN: [id number]   RIDER WAIVER AND RELEASE OF LIABILITY  For purposes of improving physical access to our facilities, [facility] is pleased to partner with third parties to provide [facility] [place] patients or other authorized individuals the option of convenient, on-demand ground transportation services (the Technical brewer") through use of the technology service that enables users to request on-demand ground transportation from independent third-party providers.  By opting to use and accept these [employer], I, the undersigned, hereby agree on behalf of myself, and on behalf of any minor child using the Government social research officer for whom I am the parent or legal guardian, as follows:  Government social research officer provided to me are provided by independent third-party transportation providers who are not [employer] or employees and who are unaffiliated with [employer]. [facility] is neither a transportation carrier nor a common or public carrier. [facility] has no control over the quality or safety of the transportation that occurs as a result of the [employer]. [facility] cannot guarantee that any third-party transportation provider will complete any arranged transportation service. [facility] makes no representation, warranty, or guarantee regarding the reliability, timeliness, quality, safety, suitability, or availability of any of the Transport Services or that they will be error free. I fully understand that traveling by vehicle involves risks and dangers of serious bodily injury, including permanent disability, paralysis, and death. I agree, on behalf of myself and on behalf of any minor child using the Transport Services for whom I am the parent or legal guardian, that the entire risk arising out of my use of the [employer] remains solely with me, to the maximum extent permitted under applicable law. The [employer] are provided "as  is" and "as available." [facility] disclaims all representations and warranties, express, implied or statutory, not expressly set out in these terms, including the implied warranties of merchantability and fitness for a particular purpose. I hereby waive and release [facility], its agents, employees, officers, directors, representatives, insurers, attorneys, assigns, successors, subsidiaries, and affiliates from any and all past, present, or future claims, demands, liabilities, actions, causes of action, or suits of any kind directly or indirectly arising from acceptance and use of the [employer]. I further waive and release [facility] and its affiliates from all present and future liability and responsibility for any injury or death to persons or damages to property caused by or related to the use of the [employer]. I have read this Waiver and Release of Liability, and I understand the terms used in it and their legal significance. This Waiver is freely and voluntarily given with the understanding that my right (as well as the right of any minor child for whom I am the parent or legal guardian using the [employer]) to legal recourse against  in connection with the [employer] is knowingly surrendered in return for use of these services.   I attest that I read the consent document to Maryellen Pile, gave Mr. Witucki the opportunity to ask questions and answered the questions asked (if any). I affirm that Maryellen Pile then provided consent for he's participation in this program.     Legrand Pitts

## 2020-12-23 ENCOUNTER — Ambulatory Visit: Payer: Medicare PPO | Admitting: Podiatry

## 2020-12-23 ENCOUNTER — Other Ambulatory Visit: Payer: Self-pay

## 2020-12-23 DIAGNOSIS — L600 Ingrowing nail: Secondary | ICD-10-CM

## 2020-12-28 NOTE — Progress Notes (Signed)
Subjective:  Patient ID: Jesse [name], male    DOB: 06/15/1950,  MRN: [id number]  Chief Complaint  Patient presents with   Nail Problem    painful bilateral hallux toes;possible spur      70 y.o. male presents with the above complaint.  Patient presents with complaint bilateral hallux medial border ingrown.  Patient states is painful to touch.  Patient would like to have it removed.  It has progressed to gotten worse.  There are some swelling and some redness associated with it.  He denies any other acute complaints.   Review of Systems: Negative except as noted in the HPI. Denies N/V/F/Ch.  Past Medical History:  Diagnosis Date   Alcohol use disorder in remission    Anxiety and depression    At high risk for falls    Former cigarette smoker    Generalized anxiety disorder    Hard of hearing    Hyperlipidemia due to type 2 diabetes mellitus ([place])    Hypertension    Morbid obesity ([place])    Neuropathy    O2 dependent    Risk for falls    Sleep apnea    Type 2 diabetes mellitus ([place])     Current Outpatient Medications:    ALPRAZolam (XANAX) 0.5 MG tablet, Take 0.5 mg by mouth daily as needed., Disp: , Rfl:    aspirin EC 81 MG tablet, Take 81 mg by mouth daily. Swallow whole., Disp: , Rfl:    bumetanide (BUMEX) 1 MG tablet, Take 1 tablet by mouth in the morning, at noon, in the evening, and at bedtime., Disp: , Rfl:    cyanocobalamin 1000 MCG tablet, Take 1,000 mcg by mouth daily., Disp: , Rfl:    DULoxetine (CYMBALTA) 60 MG capsule, Take 60 mg by mouth daily., Disp: , Rfl:    empagliflozin (JARDIANCE) 25 MG TABS tablet, Take 25 mg by mouth daily., Disp: , Rfl:    ketoconazole (NIZORAL) 2 % cream, Apply 1 application topically as needed for irritation., Disp: , Rfl:    liraglutide (VICTOZA) 18 MG/3ML SOPN, Inject 1.8 mg into the skin daily., Disp: , Rfl:    Loperamide HCl (IMODIUM PO), Take 1 capsule by mouth daily., Disp: , Rfl:    metFORMIN (GLUCOPHAGE) 500 MG tablet,  Take 500 mg by mouth 2 (two) times daily with a meal., Disp: , Rfl:    metolazone (ZAROXOLYN) 2.5 MG tablet, Take 2.5 mg by mouth 2 (two) times a week., Disp: , Rfl:    pioglitazone (ACTOS) 45 MG tablet, Take 1 tablet by mouth daily., Disp: , Rfl:    Red Yeast Rice Extract (RED YEAST RICE PO), Take by mouth daily., Disp: , Rfl:    rosuvastatin (CRESTOR) 10 MG tablet, Take 1 tablet by mouth daily., Disp: , Rfl:    topiramate (TOPAMAX) 25 MG tablet, Take 25 mg by mouth daily., Disp: , Rfl:    traMADol (ULTRAM) 50 MG tablet, Take 50 mg by mouth 2 (two) times daily as needed., Disp: , Rfl:    traZODone (DESYREL) 50 MG tablet, Take 1 tablet by mouth 3 (three) times daily., Disp: , Rfl:    TURMERIC PO, Take 150 mg by mouth 2 (two) times daily., Disp: , Rfl:    valsartan (DIOVAN) 40 MG tablet, Take 40 mg by mouth daily., Disp: , Rfl:   Social History   Tobacco Use  Smoking Status Former   Packs/day: 1.00   Types: Cigarettes   Quit date: 1999  Years since quitting: 23.8  Smokeless Tobacco Never    Allergies  Allergen Reactions   Tape    Objective:  There were no vitals filed for this visit. There is no height or weight on file to calculate BMI. Constitutional Well developed. Well nourished.  Vascular Dorsalis pedis pulses palpable bilaterally. Posterior tibial pulses palpable bilaterally. Capillary refill normal to all digits.  No cyanosis or clubbing noted. Pedal hair growth normal.  Neurologic Normal speech. Oriented to person, place, and time. Epicritic sensation to light touch grossly present bilaterally.  Dermatologic Painful ingrowing nail at medial nail borders of the hallux nail bilaterally. No other open wounds. No skin lesions.  Orthopedic: Normal joint ROM without pain or crepitus bilaterally. No visible deformities. No bony tenderness.   Radiographs: None Assessment:   1. Ingrown toenail of right foot   2. Ingrown left big toenail    Plan:  Patient was  evaluated and treated and all questions answered.  Ingrown Nail, bilaterally -Patient elects to proceed with minor surgery to remove ingrown toenail removal today. Consent reviewed and signed by patient. -Ingrown nail excised. See procedure note. -Educated on post-procedure care including soaking. Written instructions provided and reviewed. -Patient to follow up in 2 weeks for nail check.  Procedure: Excision of Ingrown Toenail Location: Bilateral 1st toe medial nail borders. Anesthesia: Lidocaine 1% plain; 1.5 mL and Marcaine 0.5% plain; 1.5 mL, digital block. Skin Prep: Betadine. Dressing: Silvadene; telfa; dry, sterile, compression dressing. Technique: Following skin prep, the toe was exsanguinated and a tourniquet was secured at the base of the toe. The affected nail border was freed, split with a nail splitter, and excised. Chemical matrixectomy was then performed with phenol and irrigated out with alcohol. The tourniquet was then removed and sterile dressing applied. Disposition: Patient tolerated procedure well. Patient to return in 2 weeks for follow-up.   No follow-ups on file.

## 2021-01-03 ENCOUNTER — Telehealth: Payer: Self-pay | Admitting: *Deleted

## 2021-01-03 NOTE — Telephone Encounter (Signed)
Robin with [facility] called requesting verbal orders for PT/OT to evaluate and Treat.  Verbal orders given.

## 2021-03-10 ENCOUNTER — Other Ambulatory Visit: Payer: Self-pay

## 2021-03-10 ENCOUNTER — Other Ambulatory Visit: Payer: Medicare PPO

## 2021-03-10 DIAGNOSIS — N183 Chronic kidney disease, stage 3 unspecified: Secondary | ICD-10-CM

## 2021-03-10 DIAGNOSIS — I129 Hypertensive chronic kidney disease with stage 1 through stage 4 chronic kidney disease, or unspecified chronic kidney disease: Secondary | ICD-10-CM

## 2021-03-10 DIAGNOSIS — E785 Hyperlipidemia, unspecified: Secondary | ICD-10-CM

## 2021-03-10 DIAGNOSIS — Z1159 Encounter for screening for other viral diseases: Secondary | ICD-10-CM

## 2021-03-10 DIAGNOSIS — E1142 Type 2 diabetes mellitus with diabetic polyneuropathy: Secondary | ICD-10-CM

## 2021-03-10 DIAGNOSIS — E1169 Type 2 diabetes mellitus with other specified complication: Secondary | ICD-10-CM

## 2021-03-13 LAB — COMPLETE METABOLIC PANEL WITH GFR
AG Ratio: 1.4 (calc) (ref 1.0–2.5)
ALT: 9 U/L (ref 9–46)
AST: 14 U/L (ref 10–35)
Albumin: 3.9 g/dL (ref 3.6–5.1)
Alkaline phosphatase (APISO): 57 U/L (ref 35–144)
BUN/Creatinine Ratio: 13 (calc) (ref 6–22)
BUN: 24 mg/dL (ref 7–25)
CO2: 29 mmol/L (ref 20–32)
Calcium: 8.7 mg/dL (ref 8.6–10.3)
Chloride: 109 mmol/L (ref 98–110)
Creat: 1.79 mg/dL — ABNORMAL HIGH (ref 0.70–1.28)
Globulin: 2.8 g/dL (calc) (ref 1.9–3.7)
Glucose, Bld: 77 mg/dL (ref 65–99)
Potassium: 5.4 mmol/L — ABNORMAL HIGH (ref 3.5–5.3)
Sodium: 139 mmol/L (ref 135–146)
Total Bilirubin: 0.5 mg/dL (ref 0.2–1.2)
Total Protein: 6.7 g/dL (ref 6.1–8.1)
eGFR: 40 mL/min/{1.73_m2} — ABNORMAL LOW (ref >=60)

## 2021-03-13 LAB — CBC WITH DIFFERENTIAL/PLATELET
Absolute Monocytes: 456 cells/uL (ref 200–950)
Basophils Absolute: 41 cells/uL (ref 0–200)
Basophils Relative: 1.2 %
Eosinophils Absolute: 214 cells/uL (ref 15–500)
Eosinophils Relative: 6.3 %
HCT: 33.7 % — ABNORMAL LOW (ref 38.5–50.0)
Hemoglobin: 10.3 g/dL — ABNORMAL LOW (ref 13.2–17.1)
Lymphs Abs: 748 cells/uL — ABNORMAL LOW (ref 850–3900)
MCH: 29.8 pg (ref 27.0–33.0)
MCHC: 30.6 g/dL — ABNORMAL LOW (ref 32.0–36.0)
MCV: 97.4 fL (ref 80.0–100.0)
MPV: 9.6 fL (ref 7.5–12.5)
Monocytes Relative: 13.4 %
Neutro Abs: 1941 cells/uL (ref 1500–7800)
Neutrophils Relative %: 57.1 %
Platelets: 130 10*3/uL — ABNORMAL LOW (ref 140–400)
RBC: 3.46 10*6/uL — ABNORMAL LOW (ref 4.20–5.80)
RDW: 13.8 % (ref 11.0–15.0)
Total Lymphocyte: 22 %
WBC: 3.4 10*3/uL — ABNORMAL LOW (ref 3.8–10.8)

## 2021-03-13 LAB — LIPID PANEL
Cholesterol: 94 mg/dL (ref <200)
HDL: 39 mg/dL — ABNORMAL LOW (ref >=40)
LDL Cholesterol (Calc): 38 mg/dL (calc)
Non-HDL Cholesterol (Calc): 55 mg/dL (calc) (ref <130)
Total CHOL/HDL Ratio: 2.4 (calc) (ref <5.0)
Triglycerides: 83 mg/dL (ref <150)

## 2021-03-13 LAB — HEMOGLOBIN A1C
Hgb A1c MFr Bld: 5.6 % of total Hgb (ref <5.7)
Mean Plasma Glucose: 114 mg/dL
eAG (mmol/L): 6.3 mmol/L

## 2021-03-13 LAB — HEPATITIS C ANTIBODY
Hepatitis C Ab: NONREACTIVE
SIGNAL TO CUT-OFF: 0.09 (ref <1.00)

## 2021-03-13 LAB — TSH: TSH: 1.99 mIU/L (ref 0.40–4.50)

## 2021-03-16 ENCOUNTER — Other Ambulatory Visit: Payer: Self-pay

## 2021-03-16 ENCOUNTER — Ambulatory Visit: Payer: Medicare PPO | Admitting: Family

## 2021-03-16 VITALS — BP 126/72 | HR 88 | Temp 97.2°F | Ht 72.0 in

## 2021-03-16 DIAGNOSIS — M25562 Pain in left knee: Secondary | ICD-10-CM

## 2021-03-16 DIAGNOSIS — R600 Localized edema: Secondary | ICD-10-CM

## 2021-03-16 DIAGNOSIS — F411 Generalized anxiety disorder: Secondary | ICD-10-CM

## 2021-03-16 DIAGNOSIS — E1169 Type 2 diabetes mellitus with other specified complication: Secondary | ICD-10-CM | POA: Diagnosis not present

## 2021-03-16 DIAGNOSIS — E1142 Type 2 diabetes mellitus with diabetic polyneuropathy: Secondary | ICD-10-CM | POA: Diagnosis not present

## 2021-03-16 DIAGNOSIS — E785 Hyperlipidemia, unspecified: Secondary | ICD-10-CM

## 2021-03-16 DIAGNOSIS — I129 Hypertensive chronic kidney disease with stage 1 through stage 4 chronic kidney disease, or unspecified chronic kidney disease: Secondary | ICD-10-CM

## 2021-03-16 DIAGNOSIS — E1122 Type 2 diabetes mellitus with diabetic chronic kidney disease: Secondary | ICD-10-CM

## 2021-03-16 DIAGNOSIS — F5101 Primary insomnia: Secondary | ICD-10-CM

## 2021-03-16 DIAGNOSIS — B379 Candidiasis, unspecified: Secondary | ICD-10-CM

## 2021-03-16 DIAGNOSIS — F321 Major depressive disorder, single episode, moderate: Secondary | ICD-10-CM | POA: Diagnosis not present

## 2021-03-16 DIAGNOSIS — G4733 Obstructive sleep apnea (adult) (pediatric): Secondary | ICD-10-CM

## 2021-03-16 DIAGNOSIS — G8929 Other chronic pain: Secondary | ICD-10-CM

## 2021-03-16 DIAGNOSIS — N183 Chronic kidney disease, stage 3 unspecified: Secondary | ICD-10-CM

## 2021-03-16 DIAGNOSIS — M25561 Pain in right knee: Secondary | ICD-10-CM

## 2021-03-16 DIAGNOSIS — R2681 Unsteadiness on feet: Secondary | ICD-10-CM

## 2021-03-16 MED ORDER — METFORMIN HCL 500 MG PO TABS: 500.0000 mg | ORAL_TABLET | Freq: Every day | ORAL | 0 refills | Status: DC

## 2021-03-16 MED ORDER — VITAMIN C 500 MG PO TABS: 250.0000 mg | ORAL_TABLET | Freq: Every day | ORAL | 0 refills | Status: AC

## 2021-03-16 MED ORDER — NYSTATIN 100000 UNIT/GM EX POWD: Freq: Two times a day (BID) | CUTANEOUS | 3 refills | Status: AC

## 2021-03-16 MED ORDER — TRAMADOL HCL 50 MG PO TABS: 50.0000 mg | ORAL_TABLET | Freq: Two times a day (BID) | ORAL | 0 refills | Status: DC | PRN

## 2021-03-16 MED ORDER — ALPRAZOLAM 0.5 MG PO TABS: 0.5000 mg | ORAL_TABLET | Freq: Every day | ORAL | 5 refills | Status: DC | PRN

## 2021-03-16 MED ORDER — KETOCONAZOLE 2 % EX CREA: 1.0000 "application " | TOPICAL_CREAM | CUTANEOUS | 5 refills | Status: DC | PRN

## 2021-03-16 NOTE — Progress Notes (Signed)
Provider: Marlowe Sax FNP-C   Minnie Shi, Nelda Bucks, NP  Patient Care Team: Jadrien Narine, Nelda Bucks, NP as PCP - General (Family Medicine)  Extended Emergency Contact Information Primary Emergency Contact: Stull,MICHAELINE W Address: [place], [place] [address] Johnnette Litter of [place] Phone: [phone] Mobile Phone: [phone] Relation: Friend Secondary Emergency Contact: Zylstra,[name] Mobile Phone: [phone] Relation: Other Preferred language: English Interpreter needed? No  Code Status:  Full Code  Goals of care: Advanced Directive information Advanced Directives 12/08/2020  Does Patient Have a Medical Advance Directive? Yes  Type of Paramedic of [place];Living will  Does patient want to make changes to medical advance directive? No - Patient declined  Copy of [place] in Chart? No - copy requested     Chief Complaint  Patient presents with   Medical Management of Chronic Issues    Medical Management of Chronic Issues. 3 Month Follow up. Requesting Rx for Nystop    HPI:  Pt is a 71 y.o. male seen today for 3 months follow up for medical management of chronic diseases.he denies any acute issues today.   Labs reviewed and discussed today. Hgb A 1 C under control 5.6  HDL 39 CR 1.79 GFR 40 previous 1.60  WBC 3.4 ,Hgb 10.3 previous 12.3 ,plts 130  Had power wheelchair ordered 3 months ago but states will be arriving next week.current has a borrowed power wheelchair.  No fall episode though states accidentally hit his foot on the wall did not have any shoes on.Has bruise on right great toenail and blood blister on second toe.denies any drainage,fever or chills   Has podiatrist that goes to the facility where he resides and would like to have his foot care done in the facility. He request referral to Opthalmology for eye exam. Due for 5 th COVID-19 booster vaccine   Continue to require tramadol for  chronic pain on both knees unable to stand for prolong period of time.Working with Physical therapy weekly.   Has been sleeping a lot during the day though usually goes to bed sometimes at three in the morning and keeps the TV on throughout Takes Trazodone few minutes before he goes to bed.    Past Medical History:  Diagnosis Date   Alcohol use disorder in remission    Anxiety and depression    At high risk for falls    Former cigarette smoker    Generalized anxiety disorder    Hard of hearing    Hyperlipidemia due to type 2 diabetes mellitus ([place])    Hypertension    Morbid obesity ([place])    Neuropathy    O2 dependent    Risk for falls    Sleep apnea    Type 2 diabetes mellitus ([place])    Past Surgical History:  Procedure Laterality Date   APPENDECTOMY  1980   KNEE SURGERY Right     Allergies  Allergen Reactions   Tape     Allergies as of 03/16/2021       Reactions   Tape         Medication List        Accurate as of March 16, 2021  1:11 PM. If you have any questions, ask your nurse or doctor.          ALPRAZolam 0.5 MG tablet Commonly known as: XANAX Take 0.5 mg by mouth daily as needed.   aspirin EC  81 MG tablet Take 81 mg by mouth daily. Swallow whole.   bumetanide 1 MG tablet Commonly known as: BUMEX Take 1 tablet by mouth in the morning, at noon, in the evening, and at bedtime.   cyanocobalamin 1000 MCG tablet Take 1,000 mcg by mouth daily.   DULoxetine 60 MG capsule Commonly known as: CYMBALTA Take 60 mg by mouth daily.   empagliflozin 25 MG Tabs tablet Commonly known as: JARDIANCE Take 25 mg by mouth daily.   IMODIUM PO Take 1 capsule by mouth daily.   ketoconazole 2 % cream Commonly known as: NIZORAL Apply 1 application topically as needed for irritation.   liraglutide 18 MG/3ML Sopn Commonly known as: VICTOZA Inject 1.8 mg into the skin daily.   metFORMIN 500 MG tablet Commonly known as: GLUCOPHAGE Take 500 mg by mouth 2  (two) times daily with a meal.   metolazone 2.5 MG tablet Commonly known as: ZAROXOLYN Take 2.5 mg by mouth 2 (two) times a week.   pioglitazone 45 MG tablet Commonly known as: ACTOS Take 1 tablet by mouth daily.   RED YEAST RICE PO Take by mouth daily.   rosuvastatin 10 MG tablet Commonly known as: CRESTOR Take 1 tablet by mouth daily.   topiramate 25 MG tablet Commonly known as: TOPAMAX Take 25 mg by mouth daily.   traMADol 50 MG tablet Commonly known as: ULTRAM Take 50 mg by mouth 2 (two) times daily as needed.   traZODone 50 MG tablet Commonly known as: DESYREL Take 1 tablet by mouth 3 (three) times daily.   TURMERIC PO Take 150 mg by mouth 2 (two) times daily.   valsartan 40 MG tablet Commonly known as: DIOVAN Take 40 mg by mouth daily.        Review of Systems  Constitutional:  Negative for appetite change, chills, fatigue, fever and unexpected weight change.  HENT:  Positive for hearing loss. Negative for congestion, dental problem, ear discharge, ear pain, facial swelling, nosebleeds, postnasal drip, rhinorrhea, sinus pressure, sinus pain, sneezing, sore throat, tinnitus and trouble swallowing.   Eyes:  Positive for visual disturbance. Negative for pain, discharge, redness and itching.  Respiratory:  Negative for cough, chest tightness and wheezing.        Chronic shortness of breath on oxygen 2 liters  CPAP at night   Cardiovascular:  Positive for leg swelling. Negative for chest pain and palpitations.  Gastrointestinal:  Negative for abdominal distention, abdominal pain, blood in stool, constipation, diarrhea, nausea and vomiting.       Colostomy  Endocrine: Negative for cold intolerance, heat intolerance, polydipsia, polyphagia and polyuria.  Genitourinary:  Negative for difficulty urinating, dysuria, flank pain, frequency and urgency.  Musculoskeletal:  Positive for arthralgias, gait problem and joint swelling. Negative for back pain, myalgias, neck  pain and neck stiffness.  Skin:  Negative for color change, pallor, rash and wound.  Neurological:  Negative for dizziness, syncope, speech difficulty, weakness, light-headedness and numbness.       Chronic headache  Hematological:  Does not bruise/bleed easily.  Psychiatric/Behavioral:  Negative for agitation, behavioral problems, confusion, hallucinations, self-injury, sleep disturbance and suicidal ideas. The patient is nervous/anxious.    Immunization History  Administered Date(s) Administered   Fluad Quad(high Dose 65+) 11/19/2020   H1N1 01/24/2008   Moderna Sars-Covid-2 Vaccination 04/03/2019, 05/05/2019, 12/20/2019, 05/23/2020   Zoster Recombinat (Shingrix) 03/01/2020   Pertinent  Health Maintenance Due  Topic Date Due   FOOT EXAM  Never done   OPHTHALMOLOGY EXAM  Never done   HEMOGLOBIN A1C  09/07/2021   INFLUENZA VACCINE  Completed   COLONOSCOPY (Pts 45-71yr Insurance coverage will need to be confirmed)  Discontinued   Fall Risk 12/08/2020  Falls in the past year? 0  Was there an injury with Fall? 0  Fall Risk Category Calculator 0  Fall Risk Category Low  Patient Fall Risk Level Low fall risk  Patient at Risk for Falls Due to No Fall Risks  Fall risk Follow up Falls evaluation completed   Functional Status Survey:    Vitals:   03/16/21 1258  BP: 126/72  Pulse: 88  Temp: (!) 97.2 F (36.2 C)  TempSrc: Skin  SpO2: 96%  Height: 6' (1.829 m)   Body mass index is 51.54 kg/m. Physical Exam Vitals reviewed.  Constitutional:      General: He is not in acute distress.    Appearance: Normal appearance. He is normal weight. He is not ill-appearing or diaphoretic.  HENT:     Head: Normocephalic.     Right Ear: Tympanic membrane, ear canal and external ear normal. There is no impacted cerumen.     Left Ear: Tympanic membrane, ear canal and external ear normal. There is no impacted cerumen.     Ears:     Comments: Bilateral hearing aids     Nose: Nose normal. No  congestion or rhinorrhea.     Mouth/Throat:     Mouth: Mucous membranes are moist.     Pharynx: Oropharynx is clear. No oropharyngeal exudate or posterior oropharyngeal erythema.  Eyes:     General: No scleral icterus.       Right eye: No discharge.        Left eye: No discharge.     Extraocular Movements: Extraocular movements intact.     Conjunctiva/sclera: Conjunctivae normal.     Pupils: Pupils are equal, round, and reactive to light.  Neck:     Vascular: No carotid bruit.  Cardiovascular:     Rate and Rhythm: Normal rate and regular rhythm.     Pulses: Normal pulses.     Heart sounds: Normal heart sounds. No murmur heard.   No friction rub. No gallop.  Pulmonary:     Effort: Pulmonary effort is normal. No respiratory distress.     Breath sounds: Normal breath sounds. No wheezing, rhonchi or rales.  Chest:     Chest wall: No tenderness.  Abdominal:     General: Bowel sounds are normal. There is no distension.     Palpations: Abdomen is soft. There is no mass.     Tenderness: There is no abdominal tenderness. There is no right CVA tenderness, left CVA tenderness, guarding or rebound.     Comments: Right colostomy patent surrounding skin tissue without any erythema  Musculoskeletal:        General: No swelling or tenderness. Normal range of motion.     Cervical back: Normal range of motion. No rigidity or tenderness.     Right lower leg: Edema present.     Left lower leg: Edema present.     Right foot: Normal capillary refill. Deformity present. No tenderness or crepitus. Normal pulse.     Left foot: Normal capillary refill. Deformity present. No tenderness or crepitus. Normal pulse.     Comments: On power Wheelchair   Lymphadenopathy:     Cervical: No cervical adenopathy.  Skin:    General: Skin is warm and dry.     Coloration: Skin is not pale.  Findings: No bruising, erythema, lesion or rash.  Neurological:     Mental Status: He is alert and oriented to person,  place, and time.     Cranial Nerves: No cranial nerve deficit.     Sensory: No sensory deficit.     Motor: No weakness.     Coordination: Coordination normal.     Gait: Gait abnormal.  Psychiatric:        Mood and Affect: Mood normal.        Speech: Speech normal.        Behavior: Behavior normal.        Thought Content: Thought content normal.        Judgment: Judgment normal.    Labs reviewed: Recent Labs    03/10/21 1002  NA 139  K 5.4*  CL 109  CO2 29  GLUCOSE 77  BUN 24  CREATININE 1.79*  CALCIUM 8.7   Recent Labs    03/10/21 1002  AST 14  ALT 9  BILITOT 0.5  PROT 6.7   Recent Labs    03/10/21 1002  WBC 3.4*  NEUTROABS 1,941  HGB 10.3*  HCT 33.7*  MCV 97.4  PLT 130*   Lab Results  Component Value Date   TSH 1.99 03/10/2021   Lab Results  Component Value Date   HGBA1C 5.6 03/10/2021   Lab Results  Component Value Date   CHOL 94 03/10/2021   HDL 39 (L) 03/10/2021   LDLCALC 38 03/10/2021   TRIG 83 03/10/2021   CHOLHDL 2.4 03/10/2021    Significant Diagnostic Results in last 30 days:  No results found.  Assessment/Plan 1. Benign hypertension with CKD (chronic kidney disease) stage III (HCC) B/p well controlled  Continue on Valsartan  -  continue on ASA and Statin for cardiac event prophylaxis - CBC with Differential/Platelet; Future - CMP with eGFR([facility]); Future  2. Diabetic peripheral neuropathy (HCC) Continue on Valsartan  - traMADol (ULTRAM) 50 MG tablet; Take 1 tablet (50 mg total) by mouth 2 (two) times daily as needed.  Dispense: 60 tablet; Refill: 0  3. Hyperlipidemia due to type 2 diabetes mellitus (HCC) LDL at goal  Continue dietary modification  - continue on rosuvastatin  - Lipid panel; Future  4. Current moderate episode of major depressive disorder, unspecified whether recurrent (HCC) Mood stable.Has adjusted well to the facility where he resides.Participates in facility activities.   5. Generalized anxiety  disorder Xanax effective  - ALPRAZolam (XANAX) 0.5 MG tablet; Take 1 tablet (0.5 mg total) by mouth daily as needed.  Dispense: 30 tablet; Refill: 5  6. Primary insomnia Advised to go to be early at least 2 hrs before midnight to avoid day sleepiness. Sleep hygiene advised to turn off TV and other electronics at least one hour before bedtime.  7. Obstructive sleep apnea syndrome Continue CPAP  8. Type 2 diabetes mellitus with stage 3 chronic kidney disease, without long-term current use of insulin, unspecified whether stage 3a or 3b CKD ([place]) Lab Results  Component Value Date   HGBA1C 5.6 03/10/2021  No home CBG brought to visit but reports CBG have been normal none above 200's  Decrease Metformin from 500 mg twice daily to once daily. - continue on Victoza,Jardiance and actos  Continue to monitor CBG - Ambulatory referral to Ophthalmology - metFORMIN (GLUCOPHAGE) 500 MG tablet; Take 1 tablet (500 mg total) by mouth daily with breakfast.  Dispense: 90 tablet; Refill: 0 - TSH; Future  9. Chronic pain of both knees  PDMP reviewed. Tramadol e-send to pharmacy.  - traMADol (ULTRAM) 50 MG tablet; Take 1 tablet (50 mg total) by mouth 2 (two) times daily as needed.  Dispense: 60 tablet; Refill: 0  10. Edema of both lower extremities Stable  - continue on Bumex and metolazone  No abrupt weight gain ,SOB  11. Unsteady gait Awaiting power wheelchair ordered on previous visit   12. Candidiasis Request refill  - ketoconazole (NIZORAL) 2 % cream; Apply 1 application topically as needed for irritation.  Dispense: 15 g; Refill: 5 - nystatin powder; Apply topically 2 (two) times daily.  Dispense: 60 g; Refill: 3  Family/ staff Communication: Reviewed plan of care with patient verbalized understanding   Labs/tests ordered:  - CBC with Differential/Platelet - CMP with eGFR([facility]) - TSH - Hgb A1C - Lipid panel  Next Appointment : 3 months for medical management of chronic issues.Fasting  Labs prior to visit.    [name], NP

## 2021-03-21 ENCOUNTER — Telehealth: Payer: Self-pay | Admitting: *Deleted

## 2021-03-21 NOTE — Telephone Encounter (Signed)
Received Paperwork from [facility] [phone] Fax: [phone]  CMN for Oxygen Concentrator filled out and placed in Jesse [name]'s folder to review and sign.  To be faxed back to Fax: [phone] once completed.   Confirmed paperwork with patient.

## 2021-03-21 NOTE — Telephone Encounter (Signed)
[name] reviewed and sign.  Faxed

## 2021-03-21 NOTE — Telephone Encounter (Signed)
Received [employer] Patient Referral Order Form.  Patient requested because he is switching companies.  Placed form in [name]'s folder to review, fill out and sign. To be faxed back to Antony Contras #[phone]

## 2021-03-21 NOTE — Telephone Encounter (Signed)
Paperwork signed and faxed.

## 2021-03-21 NOTE — Telephone Encounter (Signed)
Patient called and stated that he is switching Oxygen companies to [employer] to get Portable Concentrator.   Stated that the company will be faxing paperwork for [name] to sign.  Awaiting paperwork.

## 2021-03-22 ENCOUNTER — Telehealth: Payer: Self-pay | Admitting: *Deleted

## 2021-03-22 NOTE — Telephone Encounter (Signed)
Received Prior Authorization from [facility] [phone] for Nystop Powder Filled out PA and placed in [name]'s folder to review and sign.  To be faxed back to [facility] once completed to Fax:[phone]  Member YK:[id number] Awaiting Determination.

## 2021-04-04 ENCOUNTER — Other Ambulatory Visit: Payer: Self-pay | Admitting: Family

## 2021-04-05 NOTE — Telephone Encounter (Signed)
Patient has request refill on medication "Levemir". This medication isnt part of patient medication list. Medication pend and sent to PCP Ngetich, Nelda Bucks, NP for approval.

## 2021-04-06 ENCOUNTER — Other Ambulatory Visit: Payer: Self-pay

## 2021-04-06 MED ORDER — LEVEMIR FLEXPEN 100 UNIT/ML ~~LOC~~ SOPN: 50.0000 [IU] | PEN_INJECTOR | Freq: Every day | SUBCUTANEOUS | 3 refills | Status: DC

## 2021-04-06 NOTE — Telephone Encounter (Signed)
Incoming fax received from [facility] [address] stating Levemir flextouch had been discontinued- They have been switched to Levemir Flexpen. Please resend using new product as they are not interchangeable  Medication list updated, rx pended for provider to review and approve

## 2021-04-11 ENCOUNTER — Telehealth: Payer: Self-pay | Admitting: *Deleted

## 2021-04-11 NOTE — Telephone Encounter (Signed)
Patient just called and stated that his blood sugar has been running high all day. Stated that it was 196 and he took extra insulin and he just checked it again and it was 222. Stated that he is NOT feeling well at all and very shaky.  Stated that he wanted to let you know that his family is coming to take him to the ER.   FYI

## 2021-04-12 ENCOUNTER — Other Ambulatory Visit: Payer: Self-pay | Admitting: Family

## 2021-04-12 DIAGNOSIS — G8929 Other chronic pain: Secondary | ICD-10-CM

## 2021-04-12 DIAGNOSIS — M25562 Pain in left knee: Secondary | ICD-10-CM

## 2021-04-12 DIAGNOSIS — E1142 Type 2 diabetes mellitus with diabetic polyneuropathy: Secondary | ICD-10-CM

## 2021-04-12 MED ORDER — GLUCOSE BLOOD VI STRP: ORAL_STRIP | 3 refills | Status: DC

## 2021-04-12 MED ORDER — ONETOUCH ULTRA MINI W/DEVICE KIT: PACK | 0 refills | Status: DC

## 2021-04-12 NOTE — Telephone Encounter (Signed)
Fax prescription one touch ultra Mini  glucometer to check blood sugar once daily and as needed for signs hypoglycemia

## 2021-04-12 NOTE — Telephone Encounter (Signed)
Patient called and stated that he was Feeling MUCH better. Stated that he called EMS yesterday and they came out and evaluated. Stated that they took his blood sugar and it showed that his machine was way off.   Patient requesting a Rx for a new meter. Stated that he is currently using One Touch Ultra Mini and checking once daily. Stated that his machine is old.   New meter sent to pharmacy.

## 2021-04-12 NOTE — Telephone Encounter (Signed)
Patient has request refill on medication "Tramadol". Patient last refill dated 03/16/2021. Patient has Non Opioid Contract on file dated 01/09/2021. Medication pend and sent to PCP Ngetich, Nelda Bucks, NP for approval.

## 2021-05-02 ENCOUNTER — Other Ambulatory Visit: Payer: Self-pay | Admitting: Family

## 2021-05-12 ENCOUNTER — Emergency Department (HOSPITAL_BASED_OUTPATIENT_CLINIC_OR_DEPARTMENT_OTHER): Payer: Medicare PPO

## 2021-05-12 ENCOUNTER — Other Ambulatory Visit: Payer: Self-pay

## 2021-05-12 ENCOUNTER — Inpatient Hospital Stay (HOSPITAL_BASED_OUTPATIENT_CLINIC_OR_DEPARTMENT_OTHER)
Admission: EM | Admit: 2021-05-12 | Discharge: 2021-05-21 | DRG: 291 | Disposition: A | Discharge status: Home Health | Payer: Medicare PPO | Attending: Internal Medicine | Admitting: Internal Medicine

## 2021-05-12 ENCOUNTER — Encounter (HOSPITAL_BASED_OUTPATIENT_CLINIC_OR_DEPARTMENT_OTHER): Payer: Self-pay | Admitting: Obstetrics and Gynecology

## 2021-05-12 ENCOUNTER — Telehealth: Payer: Self-pay | Admitting: *Deleted

## 2021-05-12 DIAGNOSIS — I2510 Atherosclerotic heart disease of native coronary artery without angina pectoris: Secondary | ICD-10-CM | POA: Diagnosis present

## 2021-05-12 DIAGNOSIS — Z7982 Long term (current) use of aspirin: Secondary | ICD-10-CM

## 2021-05-12 DIAGNOSIS — J9811 Atelectasis: Secondary | ICD-10-CM | POA: Diagnosis present

## 2021-05-12 DIAGNOSIS — F411 Generalized anxiety disorder: Secondary | ICD-10-CM | POA: Diagnosis present

## 2021-05-12 DIAGNOSIS — Z9981 Dependence on supplemental oxygen: Secondary | ICD-10-CM

## 2021-05-12 DIAGNOSIS — F1011 Alcohol abuse, in remission: Secondary | ICD-10-CM | POA: Diagnosis present

## 2021-05-12 DIAGNOSIS — Z79899 Other long term (current) drug therapy: Secondary | ICD-10-CM

## 2021-05-12 DIAGNOSIS — I5031 Acute diastolic (congestive) heart failure: Secondary | ICD-10-CM

## 2021-05-12 DIAGNOSIS — F32A Depression, unspecified: Secondary | ICD-10-CM | POA: Diagnosis present

## 2021-05-12 DIAGNOSIS — R609 Edema, unspecified: Secondary | ICD-10-CM | POA: Diagnosis not present

## 2021-05-12 DIAGNOSIS — I130 Hypertensive heart and chronic kidney disease with heart failure and stage 1 through stage 4 chronic kidney disease, or unspecified chronic kidney disease: Secondary | ICD-10-CM | POA: Diagnosis not present

## 2021-05-12 DIAGNOSIS — E1122 Type 2 diabetes mellitus with diabetic chronic kidney disease: Secondary | ICD-10-CM | POA: Diagnosis present

## 2021-05-12 DIAGNOSIS — N183 Chronic kidney disease, stage 3 unspecified: Secondary | ICD-10-CM

## 2021-05-12 DIAGNOSIS — R600 Localized edema: Secondary | ICD-10-CM

## 2021-05-12 DIAGNOSIS — D638 Anemia in other chronic diseases classified elsewhere: Secondary | ICD-10-CM | POA: Diagnosis present

## 2021-05-12 DIAGNOSIS — Z794 Long term (current) use of insulin: Secondary | ICD-10-CM

## 2021-05-12 DIAGNOSIS — J9611 Chronic respiratory failure with hypoxia: Secondary | ICD-10-CM | POA: Diagnosis present

## 2021-05-12 DIAGNOSIS — N179 Acute kidney failure, unspecified: Secondary | ICD-10-CM | POA: Diagnosis present

## 2021-05-12 DIAGNOSIS — E1169 Type 2 diabetes mellitus with other specified complication: Secondary | ICD-10-CM | POA: Insufficient documentation

## 2021-05-12 DIAGNOSIS — E785 Hyperlipidemia, unspecified: Secondary | ICD-10-CM | POA: Diagnosis present

## 2021-05-12 DIAGNOSIS — D509 Iron deficiency anemia, unspecified: Secondary | ICD-10-CM | POA: Diagnosis present

## 2021-05-12 DIAGNOSIS — Z833 Family history of diabetes mellitus: Secondary | ICD-10-CM

## 2021-05-12 DIAGNOSIS — E66813 Obesity, class 3: Secondary | ICD-10-CM | POA: Diagnosis present

## 2021-05-12 DIAGNOSIS — D649 Anemia, unspecified: Secondary | ICD-10-CM

## 2021-05-12 DIAGNOSIS — R5381 Other malaise: Secondary | ICD-10-CM | POA: Diagnosis present

## 2021-05-12 DIAGNOSIS — I440 Atrioventricular block, first degree: Secondary | ICD-10-CM | POA: Diagnosis present

## 2021-05-12 DIAGNOSIS — I509 Heart failure, unspecified: Secondary | ICD-10-CM

## 2021-05-12 DIAGNOSIS — M5432 Sciatica, left side: Secondary | ICD-10-CM | POA: Diagnosis present

## 2021-05-12 DIAGNOSIS — I5043 Acute on chronic combined systolic (congestive) and diastolic (congestive) heart failure: Secondary | ICD-10-CM | POA: Diagnosis present

## 2021-05-12 DIAGNOSIS — Z933 Colostomy status: Secondary | ICD-10-CM

## 2021-05-12 DIAGNOSIS — E1142 Type 2 diabetes mellitus with diabetic polyneuropathy: Secondary | ICD-10-CM | POA: Diagnosis present

## 2021-05-12 DIAGNOSIS — G4733 Obstructive sleep apnea (adult) (pediatric): Secondary | ICD-10-CM | POA: Diagnosis present

## 2021-05-12 DIAGNOSIS — K439 Ventral hernia without obstruction or gangrene: Secondary | ICD-10-CM | POA: Diagnosis present

## 2021-05-12 DIAGNOSIS — Z87891 Personal history of nicotine dependence: Secondary | ICD-10-CM

## 2021-05-12 DIAGNOSIS — Z7401 Bed confinement status: Secondary | ICD-10-CM

## 2021-05-12 DIAGNOSIS — N184 Chronic kidney disease, stage 4 (severe): Secondary | ICD-10-CM

## 2021-05-12 DIAGNOSIS — I4510 Unspecified right bundle-branch block: Secondary | ICD-10-CM | POA: Diagnosis present

## 2021-05-12 DIAGNOSIS — J9621 Acute and chronic respiratory failure with hypoxia: Secondary | ICD-10-CM | POA: Diagnosis present

## 2021-05-12 DIAGNOSIS — Z20822 Contact with and (suspected) exposure to covid-19: Secondary | ICD-10-CM | POA: Diagnosis present

## 2021-05-12 DIAGNOSIS — Z7984 Long term (current) use of oral hypoglycemic drugs: Secondary | ICD-10-CM

## 2021-05-12 DIAGNOSIS — Z9049 Acquired absence of other specified parts of digestive tract: Secondary | ICD-10-CM

## 2021-05-12 DIAGNOSIS — J90 Pleural effusion, not elsewhere classified: Secondary | ICD-10-CM

## 2021-05-12 DIAGNOSIS — M5431 Sciatica, right side: Secondary | ICD-10-CM | POA: Diagnosis present

## 2021-05-12 DIAGNOSIS — D72818 Other decreased white blood cell count: Secondary | ICD-10-CM | POA: Diagnosis present

## 2021-05-12 DIAGNOSIS — Z66 Do not resuscitate: Secondary | ICD-10-CM | POA: Diagnosis present

## 2021-05-12 DIAGNOSIS — Z993 Dependence on wheelchair: Secondary | ICD-10-CM

## 2021-05-12 DIAGNOSIS — Z6841 Body Mass Index (BMI) 40.0 and over, adult: Secondary | ICD-10-CM

## 2021-05-12 LAB — BASIC METABOLIC PANEL
Anion gap: 7 (ref 5–15)
BUN: 40 mg/dL — ABNORMAL HIGH (ref 8–23)
CO2: 25 mmol/L (ref 22–32)
Calcium: 8.7 mg/dL — ABNORMAL LOW (ref 8.9–10.3)
Chloride: 104 mmol/L (ref 98–111)
Creatinine, Ser: 2.61 mg/dL — ABNORMAL HIGH (ref 0.61–1.24)
GFR, Estimated: 25 mL/min — ABNORMAL LOW (ref >60)
Glucose, Bld: 128 mg/dL — ABNORMAL HIGH (ref 70–99)
Potassium: 5.4 mmol/L — ABNORMAL HIGH (ref 3.5–5.1)
Sodium: 136 mmol/L (ref 135–145)

## 2021-05-12 LAB — CBC
HCT: 32.5 % — ABNORMAL LOW (ref 39.0–52.0)
Hemoglobin: 9.6 g/dL — ABNORMAL LOW (ref 13.0–17.0)
MCH: 29 pg (ref 26.0–34.0)
MCHC: 29.5 g/dL — ABNORMAL LOW (ref 30.0–36.0)
MCV: 98.2 fL (ref 80.0–100.0)
Platelets: 157 10*3/uL (ref 150–400)
RBC: 3.31 MIL/uL — ABNORMAL LOW (ref 4.22–5.81)
RDW: 16.6 % — ABNORMAL HIGH (ref 11.5–15.5)
WBC: 3.7 10*3/uL — ABNORMAL LOW (ref 4.0–10.5)
nRBC: 0 % (ref 0.0–0.2)

## 2021-05-12 LAB — TROPONIN I (HIGH SENSITIVITY)
Troponin I (High Sensitivity): 13 ng/L (ref <18)
Troponin I (High Sensitivity): 13 ng/L (ref <18)

## 2021-05-12 LAB — HEPATIC FUNCTION PANEL
ALT: 7 U/L (ref 0–44)
AST: 13 U/L — ABNORMAL LOW (ref 15–41)
Albumin: 3.8 g/dL (ref 3.5–5.0)
Alkaline Phosphatase: 49 U/L (ref 38–126)
Bilirubin, Direct: 0.1 mg/dL (ref 0.0–0.2)
Indirect Bilirubin: 0.4 mg/dL (ref 0.3–0.9)
Total Bilirubin: 0.5 mg/dL (ref 0.3–1.2)
Total Protein: 7 g/dL (ref 6.5–8.1)

## 2021-05-12 LAB — BRAIN NATRIURETIC PEPTIDE: B Natriuretic Peptide: 218.8 pg/mL — ABNORMAL HIGH (ref 0.0–100.0)

## 2021-05-12 LAB — RESP PANEL BY RT-PCR (FLU A&B, COVID) ARPGX2
Influenza A by PCR: NEGATIVE
Influenza B by PCR: NEGATIVE
SARS Coronavirus 2 by RT PCR: NEGATIVE

## 2021-05-12 MED ORDER — ALBUTEROL SULFATE HFA 108 (90 BASE) MCG/ACT IN AERS
2.0000 | INHALATION_SPRAY | RESPIRATORY_TRACT | Status: DC | PRN
  Filled 2021-05-12: qty 6.7

## 2021-05-12 NOTE — Telephone Encounter (Signed)
Patient called and stated his leg edema is worse. Stated that he has blisters for a couple weeks now. Stated that with exertion he has SOB, pants for about 2-3 minutes and then gets it under control. Patient stated that he thinks he is back up to  over 400lbs again.  ? ?Left leg swollen worse than right, has blisters and redness on both legs.  Both are alittle warm to the touch.  ?PT stated they are worse today than a week ago.  ? ?Bumetanide 2 in the morning and 2 afternoon ?Every couple of days takes Metolazone (old PCP did not want him to take more than twice a week.) ?Not wearing compression stockings right now, has a hard time getting them on and off.  ?Has a lyphedemia pump and suppose to be using twice daily but only using it once daily.  ? ?Please Advise.  ?

## 2021-05-12 NOTE — ED Provider Notes (Signed)
?[facility] ?Provider Note ? ? ?CSN: [id number] ?Arrival date & time: 05/12/21  1758 ? ?  ? ?History ? ?Chief Complaint  ?Patient presents with  ? Shortness of Breath  ? ? ?Jesse [name] is a 71 y.o. male. ? ? ?Shortness of Breath ?Associated symptoms: no abdominal pain   ?Patient presents shortness of breath.  On chronic oxygen.  History of edema.  Is on chronic diuretics.  States that he is holding much more weight on his legs and gets more short of breath.  States particular with exertion will get short of breath.  Reviewing records it appears that around 5 months ago his weight was 172 kg.  Now weight is 184 kg.  States there is still more swelling in his legs despite the diuretics.  No real cough.  No fevers.  States there is more redness on his legs. ?  ? ?Home Medications ?Prior to Admission medications   ?Medication Sig Start Date End Date Taking? Authorizing Provider  ?ALPRAZolam (XANAX) 0.5 MG tablet Take 1 tablet (0.5 mg total) by mouth daily as needed. 03/16/21   Ngetich, [name] C, NP  ?aspirin EC 81 MG tablet Take 81 mg by mouth daily. Swallow whole.    [provider]  ?Blood Glucose Monitoring Suppl (ONE TOUCH ULTRA MINI) w/Device KIT Use to test blood sugar once daily. Dx:E11.22 04/12/21   Ngetich, [name] C, NP  ?bumetanide (BUMEX) 1 MG tablet Take 1 tablet by mouth in the morning, at noon, in the evening, and at bedtime. 11/30/15   [provider]  ?cyanocobalamin 1000 MCG tablet Take 1,000 mcg by mouth daily.    [provider]  ?DULoxetine (CYMBALTA) 60 MG capsule Take 60 mg by mouth daily.    [provider]  ?empagliflozin (JARDIANCE) 25 MG TABS tablet Take 25 mg by mouth daily.    [provider]  ?glucose blood test strip Use to test blood sugar once daily. Dx:E11.22 04/12/21   Ngetich, [name] C, NP  ?insulin detemir (LEVEMIR FLEXPEN) 100 UNIT/ML FlexPen Inject 50 Units into the skin daily. 04/06/21   Ngetich, [name] C, NP   ?ketoconazole (NIZORAL) 2 % cream Apply 1 application topically as needed for irritation. 03/16/21   Ngetich, [name] C, NP  ?liraglutide (VICTOZA) 18 MG/3ML SOPN Inject 1.8 mg into the skin daily.    [provider]  ?Loperamide HCl (IMODIUM PO) Take 1 capsule by mouth daily.    [provider]  ?metFORMIN (GLUCOPHAGE) 500 MG tablet Take 1 tablet (500 mg total) by mouth daily with breakfast. 03/16/21 06/14/21  Ngetich, [name] C, NP  ?metolazone (ZAROXOLYN) 2.5 MG tablet Take 2.5 mg by mouth 2 (two) times a week.    [provider]  ?pioglitazone (ACTOS) 45 MG tablet TAKE 1 TABLET BY MOUTH EVERY DAY 05/02/21   Ngetich, Nelda Bucks, NP  ?Red Yeast Rice Extract (RED YEAST RICE PO) Take by mouth daily.    [provider]  ?rosuvastatin (CRESTOR) 10 MG tablet Take 1 tablet by mouth daily. 12/14/15   [provider]  ?topiramate (TOPAMAX) 25 MG tablet Take 25 mg by mouth daily.    [provider]  ?traMADol (ULTRAM) 50 MG tablet TAKE 1 TABLET(50 MG) BY MOUTH TWICE DAILY AS NEEDED 04/12/21   Ngetich, [name] C, NP  ?traZODone (DESYREL) 50 MG tablet Take 1 tablet by mouth 3 (three) times daily. 12/12/15   [provider]  ?TURMERIC PO Take 150 mg by mouth 2 (two)  times daily.    [provider]  ?valsartan (DIOVAN) 40 MG tablet Take 40 mg by mouth daily.    [provider]  ?   ? ?Allergies    ?Tape   ? ?Review of Systems   ?Review of Systems  ?Constitutional:  Negative for appetite change.  ?Respiratory:  Positive for shortness of breath.   ?Cardiovascular:  Positive for leg swelling.  ?Gastrointestinal:  Negative for abdominal pain.  ?Genitourinary:  Negative for flank pain.  ?Musculoskeletal:  Negative for back pain.  ?Neurological:  Negative for weakness.  ? ?Physical Exam ?Updated Vital Signs ?BP (!) 145/52   Pulse 64   Temp 97.9 ?F (36.6 ?C)   Resp 17   SpO2 99%  ?Physical Exam ?Vitals and nursing note reviewed.  ?HENT:  ?   Head: Normocephalic.   ?   Comments: Patient is on nasal cannula oxygen. ?Cardiovascular:  ?   Rate and Rhythm: Normal rate.  ?Pulmonary:  ?   Comments: Mildly harsh breath sounds without focal rales or rhonchi but complicated by patient's body habitus. ?Chest:  ?   Chest wall: No tenderness.  ?Abdominal:  ?   Comments: Right-sided colostomy.  Also large ventral hernia.  ?Musculoskeletal:  ?   Right lower leg: Edema present.  ?   Left lower leg: Edema present.  ?   Comments: Pitting edema bilateral lower extremities.  Some chronic venous changes on both legs.  Left leg more swollen than right but chronic per patient.  ?Neurological:  ?   Mental Status: He is alert.  ? ? ?ED Results / Procedures / Treatments   ?Labs ?(all labs ordered are listed, but only abnormal results are displayed) ?Labs Reviewed  ?BASIC METABOLIC PANEL - Abnormal; Notable for the following components:  ?    Result Value  ? Potassium 5.4 (*)   ? Glucose, Bld 128 (*)   ? BUN 40 (*)   ? Creatinine, Ser 2.61 (*)   ? Calcium 8.7 (*)   ? GFR, Estimated 25 (*)   ? All other components within normal limits  ?CBC - Abnormal; Notable for the following components:  ? WBC 3.7 (*)   ? RBC 3.31 (*)   ? Hemoglobin 9.6 (*)   ? HCT 32.5 (*)   ? MCHC 29.5 (*)   ? RDW 16.6 (*)   ? All other components within normal limits  ?BRAIN NATRIURETIC PEPTIDE - Abnormal; Notable for the following components:  ? B Natriuretic Peptide 218.8 (*)   ? All other components within normal limits  ?HEPATIC FUNCTION PANEL - Abnormal; Notable for the following components:  ? AST 13 (*)   ? All other components within normal limits  ?RESP PANEL BY RT-PCR (FLU A&B, COVID) [id number]  ?TROPONIN I (HIGH SENSITIVITY)  ?TROPONIN I (HIGH SENSITIVITY)  ? ? ?EKG ?EKG Interpretation ? ?Date/Time:  Friday May 12 2021 18:34:48 EDT ?Ventricular Rate:  63 ?PR Interval:    ?QRS Duration: 142 ?QT Interval:  536 ?QTC Calculation: 548 ?R Axis:   99 ?Text Interpretation: Wide QRS rhythm Right bundle branch block Abnormal ECG  When compared with ECG of 13-May-2003 14:00, Wide QRS rhythm has replaced Sinus rhythm Confirmed by Davonna Belling [phone]) on 05/12/2021 8:32:56 PM ? ?Radiology ?DG Chest Port 1 View ? ?Result Date: 05/12/2021 ?CLINICAL DATA:  Shortness of breath and leg swelling, initial encounter EXAM: PORTABLE CHEST 1 VIEW COMPARISON:  03/06/2015 FINDINGS: Cardiac shadow is enlarged but stable. Large left-sided pleural effusion is noted  with likely underlying atelectasis. Right lung is clear. No bony abnormality is noted. IMPRESSION: Large left-sided pleural effusion as described. Electronically Signed   By: Inez Catalina M.D.   On: 05/12/2021 21:02   ? ?Procedures ?Procedures  ? ? ?Medications Ordered in ED ?Medications  ?albuterol (VENTOLIN HFA) 108 (90 Base) MCG/ACT inhaler 2 puff (has no administration in time range)  ? ? ?ED Course/ Medical Decision Making/ A&P ?  ?                        ?Medical Decision Making ?Amount and/or Complexity of Data Reviewed ?Labs: ordered. ?Radiology: ordered. ? ?Risk ?Prescription drug management. ? ? ?Patient presents with shortness of breath increasing swelling on his legs.  He is on chronic diuretics.  Weight has gone up around 12 kg since a few months ago.  Creatinine also is now increased up to 2.6 from 1.82 months ago.  Mild anemia with hemoglobin of 9.6 but this appears close to his baseline.  Increasing dyspnea even with crossing from his wheelchair to the bed.  Not hypoxic.  Is on chronic oxygen.  Doubt pulmonary embolism.  However chest x-ray does show left-sided pleural effusion.  BNP is elevated.  With elevated creatinine and already being on diuretic patient will require more controlled diuresis and will require admission to the hospital.  Will discuss with hospitalist. ? ? ? ? ? ? ? ?Final Clinical Impression(s) / ED Diagnoses ?Final diagnoses:  ?AKI (acute kidney injury) ([place])  ?Peripheral edema  ?Pleural effusion  ? ? ?Rx / DC Orders ?ED Discharge Orders   ? ? None  ? ?  ? ? ?   ?Davonna Belling, MD ?05/12/21 2318 ? ?

## 2021-05-12 NOTE — Telephone Encounter (Signed)
Recommend ED evaluation due to shortness of breath. ?

## 2021-05-12 NOTE — ED Triage Notes (Signed)
Patient reports to the ER for ShOB and leg swelling with blisters on them that is worsening. Patient reports he can normally put his legs together but cannot at this time.  ?

## 2021-05-12 NOTE — ED Notes (Signed)
Pt states he wears [place] w/2 Lpm since last year. Pt respiratory status stable w/no distress noted at this time. RT will continue to monitor.  ?

## 2021-05-12 NOTE — Telephone Encounter (Signed)
Patient notified and agreed.  

## 2021-05-13 ENCOUNTER — Inpatient Hospital Stay (HOSPITAL_COMMUNITY): Payer: Medicare PPO

## 2021-05-13 DIAGNOSIS — F411 Generalized anxiety disorder: Secondary | ICD-10-CM | POA: Diagnosis present

## 2021-05-13 DIAGNOSIS — G4733 Obstructive sleep apnea (adult) (pediatric): Secondary | ICD-10-CM | POA: Diagnosis present

## 2021-05-13 DIAGNOSIS — N179 Acute kidney failure, unspecified: Secondary | ICD-10-CM | POA: Diagnosis present

## 2021-05-13 DIAGNOSIS — N184 Chronic kidney disease, stage 4 (severe): Secondary | ICD-10-CM | POA: Diagnosis not present

## 2021-05-13 DIAGNOSIS — E66813 Obesity, class 3: Secondary | ICD-10-CM | POA: Diagnosis present

## 2021-05-13 DIAGNOSIS — J9811 Atelectasis: Secondary | ICD-10-CM | POA: Diagnosis present

## 2021-05-13 DIAGNOSIS — D72818 Other decreased white blood cell count: Secondary | ICD-10-CM | POA: Diagnosis not present

## 2021-05-13 DIAGNOSIS — R0602 Shortness of breath: Secondary | ICD-10-CM | POA: Diagnosis not present

## 2021-05-13 DIAGNOSIS — J9621 Acute and chronic respiratory failure with hypoxia: Secondary | ICD-10-CM | POA: Diagnosis not present

## 2021-05-13 DIAGNOSIS — I130 Hypertensive heart and chronic kidney disease with heart failure and stage 1 through stage 4 chronic kidney disease, or unspecified chronic kidney disease: Secondary | ICD-10-CM | POA: Diagnosis not present

## 2021-05-13 DIAGNOSIS — I5031 Acute diastolic (congestive) heart failure: Secondary | ICD-10-CM | POA: Diagnosis not present

## 2021-05-13 DIAGNOSIS — N189 Chronic kidney disease, unspecified: Secondary | ICD-10-CM | POA: Diagnosis not present

## 2021-05-13 DIAGNOSIS — Z87891 Personal history of nicotine dependence: Secondary | ICD-10-CM | POA: Diagnosis not present

## 2021-05-13 DIAGNOSIS — F1011 Alcohol abuse, in remission: Secondary | ICD-10-CM | POA: Diagnosis not present

## 2021-05-13 DIAGNOSIS — Z9981 Dependence on supplemental oxygen: Secondary | ICD-10-CM | POA: Diagnosis not present

## 2021-05-13 DIAGNOSIS — E1142 Type 2 diabetes mellitus with diabetic polyneuropathy: Secondary | ICD-10-CM | POA: Diagnosis present

## 2021-05-13 DIAGNOSIS — D509 Iron deficiency anemia, unspecified: Secondary | ICD-10-CM | POA: Diagnosis present

## 2021-05-13 DIAGNOSIS — E785 Hyperlipidemia, unspecified: Secondary | ICD-10-CM | POA: Diagnosis present

## 2021-05-13 DIAGNOSIS — F32A Depression, unspecified: Secondary | ICD-10-CM | POA: Diagnosis present

## 2021-05-13 DIAGNOSIS — Z66 Do not resuscitate: Secondary | ICD-10-CM | POA: Diagnosis not present

## 2021-05-13 DIAGNOSIS — R609 Edema, unspecified: Secondary | ICD-10-CM | POA: Diagnosis present

## 2021-05-13 DIAGNOSIS — Z20822 Contact with and (suspected) exposure to covid-19: Secondary | ICD-10-CM | POA: Diagnosis not present

## 2021-05-13 DIAGNOSIS — J9611 Chronic respiratory failure with hypoxia: Secondary | ICD-10-CM | POA: Diagnosis present

## 2021-05-13 DIAGNOSIS — N183 Chronic kidney disease, stage 3 unspecified: Secondary | ICD-10-CM

## 2021-05-13 DIAGNOSIS — D649 Anemia, unspecified: Secondary | ICD-10-CM | POA: Diagnosis not present

## 2021-05-13 DIAGNOSIS — Z6841 Body Mass Index (BMI) 40.0 and over, adult: Secondary | ICD-10-CM | POA: Diagnosis not present

## 2021-05-13 DIAGNOSIS — Z79899 Other long term (current) drug therapy: Secondary | ICD-10-CM | POA: Diagnosis not present

## 2021-05-13 DIAGNOSIS — D638 Anemia in other chronic diseases classified elsewhere: Secondary | ICD-10-CM | POA: Diagnosis not present

## 2021-05-13 DIAGNOSIS — E1169 Type 2 diabetes mellitus with other specified complication: Secondary | ICD-10-CM | POA: Diagnosis present

## 2021-05-13 DIAGNOSIS — I509 Heart failure, unspecified: Secondary | ICD-10-CM

## 2021-05-13 DIAGNOSIS — I5043 Acute on chronic combined systolic (congestive) and diastolic (congestive) heart failure: Secondary | ICD-10-CM | POA: Diagnosis not present

## 2021-05-13 DIAGNOSIS — M7989 Other specified soft tissue disorders: Secondary | ICD-10-CM | POA: Diagnosis not present

## 2021-05-13 DIAGNOSIS — E1122 Type 2 diabetes mellitus with diabetic chronic kidney disease: Secondary | ICD-10-CM | POA: Diagnosis not present

## 2021-05-13 LAB — IRON AND TIBC
Iron: 45 ug/dL (ref 45–182)
Saturation Ratios: 12 % — ABNORMAL LOW (ref 17.9–39.5)
TIBC: 377 ug/dL (ref 250–450)
UIBC: 332 ug/dL

## 2021-05-13 LAB — FERRITIN: Ferritin: 47 ng/mL (ref 24–336)

## 2021-05-13 LAB — HEMOGLOBIN A1C
Hgb A1c MFr Bld: 5.9 % — ABNORMAL HIGH (ref 4.8–5.6)
Mean Plasma Glucose: 122.63 mg/dL

## 2021-05-13 LAB — BLOOD GAS, VENOUS
Acid-Base Excess: 0.1 mmol/L (ref 0.0–2.0)
Bicarbonate: 27.9 mmol/L (ref 20.0–28.0)
Drawn by: 164
O2 Saturation: 72 %
Patient temperature: 37
pCO2, Ven: 58 mmHg (ref 44–60)
pH, Ven: 7.29 (ref 7.25–7.43)
pO2, Ven: 40 mmHg (ref 32–45)

## 2021-05-13 LAB — CBG MONITORING, ED: Glucose-Capillary: 147 mg/dL — ABNORMAL HIGH (ref 70–99)

## 2021-05-13 LAB — RETICULOCYTES
Immature Retic Fract: 15.3 % (ref 2.3–15.9)
RBC.: 3.15 MIL/uL — ABNORMAL LOW (ref 4.22–5.81)
Retic Count, Absolute: 46.9 10*3/uL (ref 19.0–186.0)
Retic Ct Pct: 1.5 % (ref 0.4–3.1)

## 2021-05-13 LAB — HIV ANTIBODY (ROUTINE TESTING W REFLEX): HIV Screen 4th Generation wRfx: NONREACTIVE

## 2021-05-13 LAB — PROCALCITONIN: Procalcitonin: <0.1 ng/mL

## 2021-05-13 LAB — GLUCOSE, CAPILLARY: Glucose-Capillary: 171 mg/dL — ABNORMAL HIGH (ref 70–99)

## 2021-05-13 MED ORDER — SODIUM CHLORIDE 0.9 % IV SOLN: 2.0000 g | INTRAVENOUS | Status: DC

## 2021-05-13 MED ORDER — ALBUTEROL SULFATE (2.5 MG/3ML) 0.083% IN NEBU: 3.0000 mL | INHALATION_SOLUTION | Freq: Four times a day (QID) | RESPIRATORY_TRACT | Status: DC | PRN

## 2021-05-13 MED ORDER — AZITHROMYCIN 250 MG PO TABS
500.0000 mg | ORAL_TABLET | Freq: Every day | ORAL | Status: DC
  Administered 2021-05-13 – 2021-05-14 (×2): 500 mg via ORAL
  Filled 2021-05-13 (×2): qty 2

## 2021-05-13 MED ORDER — FUROSEMIDE 10 MG/ML IJ SOLN
40.0000 mg | Freq: Two times a day (BID) | INTRAMUSCULAR | Status: DC
  Administered 2021-05-13: 40 mg via INTRAVENOUS
  Filled 2021-05-13 (×2): qty 4

## 2021-05-13 MED ORDER — HYDRALAZINE HCL 25 MG PO TABS: 25.0000 mg | ORAL_TABLET | Freq: Four times a day (QID) | ORAL | Status: DC | PRN

## 2021-05-13 MED ORDER — IRBESARTAN 75 MG PO TABS
75.0000 mg | ORAL_TABLET | Freq: Every day | ORAL | Status: DC
  Filled 2021-05-13: qty 1

## 2021-05-13 MED ORDER — ALPRAZOLAM 0.5 MG PO TABS
0.5000 mg | ORAL_TABLET | Freq: Every day | ORAL | Status: DC | PRN
  Administered 2021-05-13 – 2021-05-18 (×5): 0.5 mg via ORAL
  Filled 2021-05-13 (×5): qty 1

## 2021-05-13 MED ORDER — METOLAZONE 2.5 MG PO TABS
2.5000 mg | ORAL_TABLET | ORAL | Status: DC
Start: 2021-05-15 — End: 2021-05-13
  Filled 2021-05-13: qty 1

## 2021-05-13 MED ORDER — PREDNISONE 20 MG PO TABS: 40.0000 mg | ORAL_TABLET | Freq: Every day | ORAL | Status: DC

## 2021-05-13 MED ORDER — ACETAMINOPHEN 325 MG PO TABS
650.0000 mg | ORAL_TABLET | ORAL | Status: DC | PRN
  Administered 2021-05-14 – 2021-05-18 (×4): 650 mg via ORAL
  Filled 2021-05-13 (×4): qty 2

## 2021-05-13 MED ORDER — TRAZODONE HCL 50 MG PO TABS
50.0000 mg | ORAL_TABLET | Freq: Every day | ORAL | Status: DC
  Administered 2021-05-13 – 2021-05-20 (×8): 50 mg via ORAL
  Filled 2021-05-13 (×8): qty 1

## 2021-05-13 MED ORDER — SODIUM CHLORIDE 0.9% FLUSH: 3.0000 mL | INTRAVENOUS | Status: DC | PRN

## 2021-05-13 MED ORDER — IPRATROPIUM-ALBUTEROL 0.5-2.5 (3) MG/3ML IN SOLN
3.0000 mL | Freq: Four times a day (QID) | RESPIRATORY_TRACT | Status: DC
  Administered 2021-05-13: 3 mL via RESPIRATORY_TRACT
  Filled 2021-05-13: qty 3

## 2021-05-13 MED ORDER — EMPAGLIFLOZIN 25 MG PO TABS
25.0000 mg | ORAL_TABLET | Freq: Every day | ORAL | Status: DC
  Filled 2021-05-13: qty 1

## 2021-05-13 MED ORDER — DULOXETINE HCL 60 MG PO CPEP
60.0000 mg | ORAL_CAPSULE | Freq: Every day | ORAL | Status: DC
Start: 2021-05-13 — End: 2021-05-21
  Administered 2021-05-14 – 2021-05-21 (×8): 60 mg via ORAL
  Filled 2021-05-13 (×9): qty 1

## 2021-05-13 MED ORDER — SODIUM CHLORIDE 0.9 % IV SOLN
2.0000 g | INTRAVENOUS | Status: DC
  Administered 2021-05-13: 2 g via INTRAVENOUS
  Filled 2021-05-13: qty 20

## 2021-05-13 MED ORDER — SODIUM CHLORIDE 0.9% FLUSH
3.0000 mL | Freq: Two times a day (BID) | INTRAVENOUS | Status: DC
  Administered 2021-05-13 – 2021-05-21 (×16): 3 mL via INTRAVENOUS

## 2021-05-13 MED ORDER — IPRATROPIUM-ALBUTEROL 0.5-2.5 (3) MG/3ML IN SOLN
3.0000 mL | Freq: Two times a day (BID) | RESPIRATORY_TRACT | Status: DC
  Administered 2021-05-14 – 2021-05-20 (×13): 3 mL via RESPIRATORY_TRACT
  Filled 2021-05-13 (×15): qty 3

## 2021-05-13 MED ORDER — PIOGLITAZONE HCL 15 MG PO TABS
45.0000 mg | ORAL_TABLET | Freq: Every day | ORAL | Status: DC
  Filled 2021-05-13: qty 3

## 2021-05-13 MED ORDER — TOPIRAMATE 25 MG PO TABS
25.0000 mg | ORAL_TABLET | Freq: Every day | ORAL | Status: DC
  Administered 2021-05-14 – 2021-05-21 (×8): 25 mg via ORAL
  Filled 2021-05-13 (×9): qty 1

## 2021-05-13 MED ORDER — BUMETANIDE 1 MG PO TABS
1.0000 mg | ORAL_TABLET | Freq: Three times a day (TID) | ORAL | Status: DC
Start: 2021-05-13 — End: 2021-05-13
  Filled 2021-05-13 (×2): qty 1

## 2021-05-13 MED ORDER — ROSUVASTATIN CALCIUM 5 MG PO TABS
10.0000 mg | ORAL_TABLET | Freq: Every day | ORAL | Status: DC
  Administered 2021-05-13 – 2021-05-21 (×9): 10 mg via ORAL
  Filled 2021-05-13 (×9): qty 2

## 2021-05-13 MED ORDER — ONDANSETRON HCL 4 MG/2ML IJ SOLN: 4.0000 mg | Freq: Four times a day (QID) | INTRAMUSCULAR | Status: DC | PRN

## 2021-05-13 MED ORDER — INSULIN ASPART 100 UNIT/ML IJ SOLN
0.0000 [IU] | Freq: Three times a day (TID) | INTRAMUSCULAR | Status: DC
  Administered 2021-05-14 (×2): 2 [IU] via SUBCUTANEOUS
  Administered 2021-05-15: 3 [IU] via SUBCUTANEOUS
  Administered 2021-05-15 – 2021-05-18 (×6): 2 [IU] via SUBCUTANEOUS
  Administered 2021-05-18 – 2021-05-19 (×3): 3 [IU] via SUBCUTANEOUS
  Administered 2021-05-20 (×2): 2 [IU] via SUBCUTANEOUS
  Administered 2021-05-21: 3 [IU] via SUBCUTANEOUS

## 2021-05-13 MED ORDER — METFORMIN HCL 500 MG PO TABS
500.0000 mg | ORAL_TABLET | Freq: Every day | ORAL | Status: DC
  Administered 2021-05-13: 500 mg via ORAL
  Filled 2021-05-13: qty 1

## 2021-05-13 MED ORDER — ASPIRIN EC 81 MG PO TBEC
81.0000 mg | DELAYED_RELEASE_TABLET | Freq: Every day | ORAL | Status: DC
  Administered 2021-05-13 – 2021-05-21 (×9): 81 mg via ORAL
  Filled 2021-05-13 (×9): qty 1

## 2021-05-13 MED ORDER — HEPARIN SODIUM (PORCINE) 5000 UNIT/ML IJ SOLN
5000.0000 [IU] | Freq: Three times a day (TID) | INTRAMUSCULAR | Status: DC
  Administered 2021-05-13 – 2021-05-21 (×23): 5000 [IU] via SUBCUTANEOUS
  Filled 2021-05-13 (×23): qty 1

## 2021-05-13 MED ORDER — SODIUM CHLORIDE 0.9 % IV SOLN: 250.0000 mL | INTRAVENOUS | Status: DC | PRN

## 2021-05-13 MED ORDER — ALBUTEROL SULFATE (2.5 MG/3ML) 0.083% IN NEBU: 2.5000 mg | INHALATION_SOLUTION | RESPIRATORY_TRACT | Status: DC | PRN

## 2021-05-13 NOTE — H&P (Addendum)
?History and Physical  ? ? ?ARLINGTON SIGMUND [id number]:[id number] DOB: 19-Jun-1950 DOA: 05/12/2021 ? ?PCP: Ngetich, Nelda Bucks, NP (Confirm with patient/family/NH records and if not entered, this has to be entered at [facility] point of entry) ?Patient coming from: Home ? ?I have personally briefly reviewed patient's old medical records in [place] ? ?Chief Complaint: Leg swelling, SOB ? ?HPI: Jesse [name] is a 71 y.o. male with medical history significant of morbid obesity, Kd stage IIIb, chronic hypoxic respiratory failure on 2 L (unknown etiology), HTN, IDDM, status post partial colectomy and colostomy, chronic ambulation dysfunction, presented with increasing leg swelling and shortness of breath. ? ?Patient has chronic bilateral lower extremity edema for 1 and half year, has been on Bumex 1 mg twice daily +2 times weekly metolazone.  He makes good urine and denies any urinary problems.  Gradually, patient has had increasing of leg swelling, and he estimated about 20 pounds weight gain in last 3 months. ? ?For about 2 to 3 weeks, patient has had increasing shortness of breath, exertional, and since last week, he has had trouble to move him from bed to wheelchair.  Denies any chest pain, he has had chronic intermittent wheezing but no cough denies any fever or chills.  He reported that he moved from [place] about 8 months ago, he was diagnosed with hypoxia in 2022 and has been on oxygen, but he said he underwent pulmonary evaluation as well as echocardiogram and CT scan, none showed any abnormalities to explain the hypoxia.  He does use CPAP at bedtime for OSA.  No recent medication changes. ? ?ED Course: Vital signs, no tachycardia, no tachypnea, no hypotension.  Stable on baseline 2 L.  CT chest showed moderate to large left-sided pleural effusion. ? ?Review of Systems: As per HPI otherwise 14 point review of systems negative.  ? ? ?Past Medical History:  ?Diagnosis Date  ? Alcohol use disorder in remission   ?  Anxiety and depression   ? At high risk for falls   ? Former cigarette smoker   ? Generalized anxiety disorder   ? Hard of hearing   ? Hyperlipidemia due to type 2 diabetes mellitus ([place])   ? Hypertension   ? Morbid obesity ([place])   ? Neuropathy   ? O2 dependent   ? Risk for falls   ? Sleep apnea   ? Type 2 diabetes mellitus ([place])   ? ? ?Past Surgical History:  ?Procedure Laterality Date  ? APPENDECTOMY  1980  ? KNEE SURGERY Right   ? ? ? reports that he quit smoking about 24 years ago. His smoking use included cigarettes. He smoked an average of 1 pack per day. He has never used smokeless tobacco. He reports that he does not currently use alcohol. He reports that he does not use drugs. ? ?Allergies  ?Allergen Reactions  ? Tape Rash  ? ? ?Family History  ?Problem Relation Age of Onset  ? Diabetes Brother   ? Bipolar disorder Daughter   ? Anxiety disorder Son   ? ? ? ?Prior to Admission medications   ?Medication Sig Start Date End Date Taking? Authorizing Provider  ?alfuzosin (UROXATRAL) 10 MG 24 hr tablet Take 10 mg by mouth daily. 05/03/21  Yes [provider]  ?ALPRAZolam Duanne Moron) 0.5 MG tablet Take 1 tablet (0.5 mg total) by mouth daily as needed. ?Patient taking differently: Take 0.5 mg by mouth daily as needed for anxiety. 03/16/21  Yes Ngetich, [name] C,  NP  ?aspirin EC 81 MG tablet Take 81 mg by mouth daily. Swallow whole.   Yes [provider]  ?Blood Glucose Monitoring Suppl (ONE TOUCH ULTRA MINI) w/Device KIT Use to test blood sugar once daily. Dx:E11.22 04/12/21  Yes Ngetich, [name] C, NP  ?bumetanide (BUMEX) 1 MG tablet Take 1 tablet by mouth 2 (two) times daily. 11/30/15  Yes [provider]  ?cyanocobalamin 1000 MCG tablet Take 1,000 mcg by mouth daily.   Yes [provider]  ?DULoxetine (CYMBALTA) 60 MG capsule Take 60 mg by mouth daily.   Yes [provider]  ?empagliflozin (JARDIANCE) 25 MG TABS tablet Take 25 mg by mouth daily.   Yes [provider]   ?insulin detemir (LEVEMIR FLEXPEN) 100 UNIT/ML FlexPen Inject 50 Units into the skin daily. 04/06/21  Yes Ngetich, [name] C, NP  ?isosorbide mononitrate (IMDUR) 60 MG 24 hr tablet Take 60 mg by mouth daily. 03/31/21  Yes [provider]  ?ketoconazole (NIZORAL) 2 % cream Apply 1 application topically as needed for irritation. 03/16/21  Yes Ngetich, Nelda Bucks, NP  ?Loperamide HCl (IMODIUM PO) Take 1 capsule by mouth See admin instructions. Take 1 capsule by mouth about 9 times daily per patient   Yes [provider]  ?metFORMIN (GLUCOPHAGE) 500 MG tablet Take 1 tablet (500 mg total) by mouth daily with breakfast. 03/16/21 06/14/21 Yes Ngetich, [name] C, NP  ?NYSTATIN powder Apply 1 application. topically 2 (two) times daily. to affected area 05/03/21  Yes [provider]  ?pioglitazone (ACTOS) 45 MG tablet TAKE 1 TABLET BY MOUTH EVERY DAY ?Patient taking differently: Take 45 mg by mouth daily. 05/02/21  Yes Ngetich, Nelda Bucks, NP  ?Red Yeast Rice Extract (RED YEAST RICE PO) Take by mouth daily.   Yes [provider]  ?topiramate (TOPAMAX) 25 MG tablet Take 25 mg by mouth daily.   Yes [provider]  ?traMADol (ULTRAM) 50 MG tablet TAKE 1 TABLET(50 MG) BY MOUTH TWICE DAILY AS NEEDED ?Patient taking differently: Take 50 mg by mouth 2 (two) times daily as needed for moderate pain. 04/12/21  Yes Ngetich, [name] C, NP  ?traZODone (DESYREL) 50 MG tablet Take 150 mg by mouth at bedtime. 12/12/15  Yes [provider]  ?TURMERIC PO Take 150 mg by mouth 2 (two) times daily.   Yes [provider]  ?glucose blood test strip Use to test blood sugar once daily. Dx:E11.22 04/12/21   Ngetich, [name] C, NP  ?liraglutide (VICTOZA) 18 MG/3ML SOPN Inject 1.8 mg into the skin daily.    [provider]  ?metolazone (ZAROXOLYN) 2.5 MG tablet Take 2.5 mg by mouth 2 (two) times a week.    [provider]  ?rosuvastatin (CRESTOR) 10 MG tablet Take 1 tablet by mouth daily.  12/14/15   [provider]  ?valsartan (DIOVAN) 40 MG tablet Take 40 mg by mouth at bedtime.    [provider]  ? ? ?Physical Exam: ?Vitals:  ? 05/13/21 1200 05/13/21 1428 05/13/21 1647 05/13/21 1657  ?BP: (!) 134/48 (!) 119/55  (!) 138/59  ?Pulse: (!) 58 71  70  ?Resp: 18 (!) 25  19  ?Temp:  98.4 ?F (36.9 ?C)  98.8 ?F (37.1 ?C)  ?TempSrc:  Oral  Oral  ?SpO2: 98% 98%  100%  ?Weight:   (!) 182.2 kg   ?Height:   6' (1.829 m)   ? ? ?Constitutional: NAD, calm, comfortable ?Vitals:  ? 05/13/21 1200 05/13/21 1428 05/13/21 1647 05/13/21 1657  ?  BP: (!) 134/48 (!) 119/55  (!) 138/59  ?Pulse: (!) 58 71  70  ?Resp: 18 (!) 25  19  ?Temp:  98.4 ?F (36.9 ?C)  98.8 ?F (37.1 ?C)  ?TempSrc:  Oral  Oral  ?SpO2: 98% 98%  100%  ?Weight:   (!) 182.2 kg   ?Height:   6' (1.829 m)   ? ?Eyes: PERRL, lids and conjunctivae normal ?ENMT: Mucous membranes are moist. Posterior pharynx clear of any exudate or lesions.Normal dentition.  ?Neck: normal, supple, no masses, no thyromegaly ?Respiratory: Diminished breathing sound on left lower field, diffused wheezing, no crackles.  Increasing respiratory effort.  Talking in broken sentences.  No accessory muscle use.  ?Cardiovascular: Regular rate and rhythm, no murmurs / rubs / gallops.  2+ extremity edema. 2+ pedal pulses. No carotid bruits.  ?Abdomen: no tenderness, no masses palpated. No hepatosplenomegaly. Bowel sounds positive.  ?Musculoskeletal: no clubbing / cyanosis. No joint deformity upper and lower extremities. Good ROM, no contractures. Normal muscle tone.  ?Skin: no rashes, lesions, ulcers. No induration ?Neurologic: CN 2-12 grossly intact. Sensation intact, DTR normal. Strength 5/5 in all 4.  ?Psychiatric: Normal judgment and insight. Alert and oriented x 3. Normal mood.  ? ? ? ?Labs on Admission: I have personally reviewed following labs and imaging studies ? ?CBC: ?Recent Labs  ?Lab 05/12/21 ?1830  ?WBC 3.7*  ?HGB 9.6*  ?HCT 32.5*  ?MCV 98.2  ?PLT 157  ? ?Basic  Metabolic Panel: ?Recent Labs  ?Lab 05/12/21 ?1830  ?NA 136  ?K 5.4*  ?CL 104  ?CO2 25  ?GLUCOSE 128*  ?BUN 40*  ?CREATININE 2.61*  ?CALCIUM 8.7*  ? ?GFR: ?Estimated Creatinine Clearance: 43.8 mL/min (A) (by C-G

## 2021-05-13 NOTE — ED Notes (Signed)
Attempt report to floor RN ?

## 2021-05-13 NOTE — Progress Notes (Signed)
Patient's daughter showed a result of Echo from 2021, done in [place], which showed normal LVEF 55% and normal diastolic function but mild dilation of RV. Again raised the concern about right sided failure and further cor pulmonale. ? ?Explained to family about the clinical finding including CT images. All questions answered with my best knowledge. ?

## 2021-05-13 NOTE — ED Notes (Signed)
Pt placed himself on his home CPAP machine on his home setting. RT bled in 2 Lpm to CPAP machine for pt. Pt resting comfortably w/out any distress at this time. RT will continue to monitor.  ?

## 2021-05-13 NOTE — Plan of Care (Signed)
  Problem: Education: Goal: Knowledge of General Education information will improve Description: Including pain rating scale, medication(s)/side effects and non-pharmacologic comfort measures Outcome: Progressing   Problem: Health Behavior/Discharge Planning: Goal: Ability to manage health-related needs will improve Outcome: Progressing   Problem: Clinical Measurements: Goal: Will remain free from infection Outcome: Progressing   

## 2021-05-14 ENCOUNTER — Inpatient Hospital Stay (HOSPITAL_COMMUNITY): Payer: Medicare PPO

## 2021-05-14 DIAGNOSIS — D649 Anemia, unspecified: Secondary | ICD-10-CM | POA: Diagnosis not present

## 2021-05-14 DIAGNOSIS — R0602 Shortness of breath: Secondary | ICD-10-CM

## 2021-05-14 DIAGNOSIS — E1169 Type 2 diabetes mellitus with other specified complication: Secondary | ICD-10-CM | POA: Insufficient documentation

## 2021-05-14 DIAGNOSIS — J9611 Chronic respiratory failure with hypoxia: Secondary | ICD-10-CM | POA: Diagnosis not present

## 2021-05-14 DIAGNOSIS — N189 Chronic kidney disease, unspecified: Secondary | ICD-10-CM

## 2021-05-14 DIAGNOSIS — M7989 Other specified soft tissue disorders: Secondary | ICD-10-CM | POA: Diagnosis not present

## 2021-05-14 DIAGNOSIS — I5031 Acute diastolic (congestive) heart failure: Secondary | ICD-10-CM

## 2021-05-14 DIAGNOSIS — N179 Acute kidney failure, unspecified: Secondary | ICD-10-CM | POA: Diagnosis not present

## 2021-05-14 LAB — GLUCOSE, PLEURAL OR PERITONEAL FLUID: Glucose, Fluid: 139 mg/dL

## 2021-05-14 LAB — BODY FLUID CELL COUNT WITH DIFFERENTIAL
Eos, Fluid: 0 %
Lymphs, Fluid: 65 %
Monocyte-Macrophage-Serous Fluid: 22 % — ABNORMAL LOW (ref 50–90)
Neutrophil Count, Fluid: 13 % (ref 0–25)
Total Nucleated Cell Count, Fluid: 183 cu mm (ref 0–1000)

## 2021-05-14 LAB — ALBUMIN, PLEURAL OR PERITONEAL FLUID: Albumin, Fluid: 2.2 g/dL

## 2021-05-14 LAB — ECHOCARDIOGRAM COMPLETE
Height: 72 in
S' Lateral: 4 cm
Weight: 6409.21 oz

## 2021-05-14 LAB — BASIC METABOLIC PANEL
Anion gap: 3 — ABNORMAL LOW (ref 5–15)
BUN: 28 mg/dL — ABNORMAL HIGH (ref 8–23)
CO2: 28 mmol/L (ref 22–32)
Calcium: 8.3 mg/dL — ABNORMAL LOW (ref 8.9–10.3)
Chloride: 108 mmol/L (ref 98–111)
Creatinine, Ser: 2.07 mg/dL — ABNORMAL HIGH (ref 0.61–1.24)
GFR, Estimated: 34 mL/min — ABNORMAL LOW (ref >60)
Glucose, Bld: 130 mg/dL — ABNORMAL HIGH (ref 70–99)
Potassium: 4.9 mmol/L (ref 3.5–5.1)
Sodium: 139 mmol/L (ref 135–145)

## 2021-05-14 LAB — PROTEIN, PLEURAL OR PERITONEAL FLUID: Total protein, fluid: 3.7 g/dL

## 2021-05-14 LAB — CBC
HCT: 29.5 % — ABNORMAL LOW (ref 39.0–52.0)
Hemoglobin: 8.9 g/dL — ABNORMAL LOW (ref 13.0–17.0)
MCH: 30 pg (ref 26.0–34.0)
MCHC: 30.2 g/dL (ref 30.0–36.0)
MCV: 99.3 fL (ref 80.0–100.0)
Platelets: 132 10*3/uL — ABNORMAL LOW (ref 150–400)
RBC: 2.97 MIL/uL — ABNORMAL LOW (ref 4.22–5.81)
RDW: 16.5 % — ABNORMAL HIGH (ref 11.5–15.5)
WBC: 3.3 10*3/uL — ABNORMAL LOW (ref 4.0–10.5)
nRBC: 0 % (ref 0.0–0.2)

## 2021-05-14 LAB — GLUCOSE, CAPILLARY
Glucose-Capillary: 104 mg/dL — ABNORMAL HIGH (ref 70–99)
Glucose-Capillary: 136 mg/dL — ABNORMAL HIGH (ref 70–99)
Glucose-Capillary: 128 mg/dL — ABNORMAL HIGH (ref 70–99)
Glucose-Capillary: 152 mg/dL — ABNORMAL HIGH (ref 70–99)

## 2021-05-14 LAB — LACTATE DEHYDROGENASE, PLEURAL OR PERITONEAL FLUID: LD, Fluid: 75 U/L — ABNORMAL HIGH (ref 3–23)

## 2021-05-14 LAB — PROCALCITONIN: Procalcitonin: <0.1 ng/mL

## 2021-05-14 MED ORDER — PERFLUTREN LIPID MICROSPHERE
1.0000 mL | INTRAVENOUS | Status: AC | PRN
  Administered 2021-05-14: 3 mL via INTRAVENOUS
  Filled 2021-05-14: qty 10

## 2021-05-14 MED ORDER — FUROSEMIDE 10 MG/ML IJ SOLN
60.0000 mg | Freq: Two times a day (BID) | INTRAMUSCULAR | Status: DC
  Administered 2021-05-14 – 2021-05-17 (×7): 60 mg via INTRAVENOUS
  Filled 2021-05-14 (×7): qty 6

## 2021-05-14 MED ORDER — LIDOCAINE HCL (PF) 1 % IJ SOLN
INTRAMUSCULAR | Status: AC
  Filled 2021-05-14: qty 30

## 2021-05-14 NOTE — Progress Notes (Signed)
?  Echocardiogram ?2D Echocardiogram has been performed. ? ?Jesse [name] ?05/14/2021, 9:00 AM ?

## 2021-05-14 NOTE — Progress Notes (Signed)
VASCULAR LAB ? ? ? ?Bilateral lower extremity venous duplex has been performed. ? ?See CV proc for preliminary results. ? ? ?Ronnisha Felber, RVT ?05/14/2021, 9:20 AM ? ?

## 2021-05-14 NOTE — Assessment & Plan Note (Addendum)
Echocardiogram with LV EF 60 to 65%, with mild left ventricular hypertrophy, preserved RV systolic function. Left atrial size with moderate dilatation.  ?Mild to moderate mitral stenosis.  ? ?Acute cardiogenic pulmonary edema with right pleural effusion.  ? ?Urine output 4,350 ml over last 24 hrs.  ?Blood pressure systolic 575 to 051 mmHg.  ? ?Continue diuresis with furosemide, continue with empagliflozin.  ?Continue to be hypervolemic.  ? ?

## 2021-05-14 NOTE — Assessment & Plan Note (Addendum)
Fasting glucose is 130 mg/dl.  ?Continue glucose cover and monitoring with insulin sliding scale.  ?Patient is tolerating po well.  ? ?Continue with statin therapy.  ?

## 2021-05-14 NOTE — Assessment & Plan Note (Signed)
Continue with Cpap 

## 2021-05-14 NOTE — Progress Notes (Signed)
?Progress Note ? ? ?Patient: Jesse [name] MWN:[id number] DOB: 1950/05/25 DOA: 05/12/2021     1 ?DOS: the patient was seen and examined on 05/14/2021 ?  ?Brief hospital course: ?Jesse [name] was admitted to the hospital with the working diagnosis of decompensated heart failure.  ? ?71 yo male with the past medical history of obesity class 3, chronic kidney disease, hypertension, type 2 diabetes mellitus, sp colectomy/ colostomy, and chronic hypoxemic respiratory failure who presented with dyspnea and lower extremity edema. Reported 2 to 3 weeks of worsening dyspnea and lower extremity edema, decreased physical functional capacity having dyspnea with minimal efforts. Positive weight gain 20 lbs over last 3 months. On his initial physical examination his blood pressure was 134/46, HR 58, RR 18 and 25, 02 saturation 98%, lungs with decreased breath sounds more on the left side with diffuse wheezing, along with increased work of breathing, heart with S1 and S2 present and rhythmic, with no gallops or murmurs, abdomen soft and non tender, positive lower extremity edema ++.   ? ?Na 136, K 5,4. CL 104, bicarbonate 25, glucose 128, bun 40 cr 2,61  ?BNP 218 ?Wbc 3,7, hgb 9,6, hct 32,5 plt 157  ?Sars covid 19 negative  ? ?Chest radiograph with left pleural effusion, mild cardiomegaly.  ?CT Chest with large left pleural effusion and left lower lobe atelectasis.  ? ?EKG 63 bpm, right axis deviation, qtc 548, right bundle branch block, with 1st degree AV block, sinus rhythm with no significant ST segment or T wave changes.  ? ?Patient was placed on furosemide for diuresis. ? ?03/26 thoracentesis on the right 1.2 L removed.  ? ?Assessment and Plan: ?* Acute CHF (congestive heart failure) ([place]) ?Acute pulmonary edema with right pleural effusion.  ?Patient with significant volume overload.  ?Continue to have edema, dyspnea has improved after thoracentesis but not back to baseline.  ? ?Documented urine output 950 cc.  ?Blood pressure  117 to 130 mmHg.  ? ?Plan to increase furosemide to 60 mg IV q12 hrs to target further negative fluid balance.  ?Follow up on echocardiogram to guide further medical therapy. ?Strict in and out ?Patient has been non ambulatory at home, he uses electric wheelchair.  ? ?Acute kidney injury superimposed on chronic kidney disease ([place]) ?CKD stage 3b (not 3a)  ?Renal function with serum cr at 2,0 with K at 4,9 and serum bicarbonate at 28. ?Plan to continue diuresis with furosemide and follow up renal function in am. ?Avoid hypotension and nephrotoxic medications.  ? ?Chronic respiratory failure with hypoxia ([place]) ?Continue supplemental 02 per [place] to keep 02 saturation 92% or greater.  ?Encourage mobility.  ?Out of bed to chair tid with meals ?PT and OT evaluation.  ? ?Discontinue steroids and antibiotics, symptoms likely related to volume overload. Pneumonia ruled out.  ? ?Type 2 diabetes mellitus with hyperlipidemia ([place]) ?Continue glucose cover and monitoring with insulin sliding scale.  ?Patient is tolerating po well.  ?Continue with statin therapy.  ? ?OSA (obstructive sleep apnea) ?Continue with Cpap.  ? ?Chronic anemia ?Leukopenia.  ?hgb is 8,9 and hct at 29,5 ?Likely related to chronic disease. ?Plan to check on iron stores.  ? ?Obesity, Class III, BMI 40-49.9 (morbid obesity) ([place]) ?Calculated BMI is 54,33. ?He has a large ventral hernia on the left side and colostomy bag on the right side of his abdomen.  ? ? ? ? ?  ? ?Subjective: patient is feeling better but not back to baseline, continue to have edema and dyspnea  ? ?  Physical Exam: ?Vitals:  ? 05/14/21 0739 05/14/21 1036 05/14/21 1041 05/14/21 1132  ?BP: (!) 117/58 (!) 139/39 (!) 111/48 130/60  ?Pulse: (!) 58   65  ?Resp: 16   18  ?Temp: 98.6 ?F (37 ?C)   97.8 ?F (36.6 ?C)  ?TempSrc:    Oral  ?SpO2: 97%   98%  ?Weight:      ?Height:      ? ?Neurology awake and alert ?ENT with mild pallor ?Cardiovascular with bilateral rales at bases but no wheezing or  rhonchi ?No JVD ?Positive lower extremity edema +++ up to the thighs ?Abdomen protuberant with large ventral hernia a the left  side, colostomy bag at the right side, not distended.   ?Data Reviewed: ? ? ? ?Family Communication: I spoke with patient's POA at the bedside, we talked in detail about patient's condition, plan of care and prognosis and all questions were addressed. ? ? ?Disposition: ?Status is: Inpatient ?Remains inpatient appropriate because: heart failure  ? Planned Discharge Destination: Home ? ? ? ?Author: ?Tawni Millers, MD ?05/14/2021 12:56 PM ? ?For on call review [email or web address].  ?

## 2021-05-14 NOTE — Progress Notes (Signed)
Patient says he takes trazodone 150mg  (not 50mg  as listed on MAR). He would like his home dose so he can sleep better, if possible. Please advise. ?

## 2021-05-14 NOTE — Hospital Course (Addendum)
Jesse [name] was admitted to the hospital with the working diagnosis of decompensated heart failure.  ? ?71 yo male with the past medical history of obesity class 3, chronic kidney disease, hypertension, type 2 diabetes mellitus, sp colectomy/ colostomy, ambulatory dysfunction and chronic hypoxemic respiratory failure who presented with dyspnea and lower extremity edema. Reported 2 to 3 weeks of worsening dyspnea and lower extremity edema, decreased physical functional capacity having dyspnea with minimal efforts. Positive weight gain 20 lbs over last 3 months. On his initial physical examination his blood pressure was 134/46, HR 58, RR 18 and 25, 02 saturation 98%, lungs with decreased breath sounds more on the left side with diffuse wheezing, along with increased work of breathing, heart with S1 and S2 present and rhythmic, with no gallops or murmurs, abdomen soft and non tender, positive lower extremity edema ++.   ? ?Na 136, K 5,4. CL 104, bicarbonate 25, glucose 128, bun 40 cr 2,61  ?BNP 218 ?Wbc 3,7, hgb 9,6, hct 32,5 plt 157  ?Sars covid 19 negative  ? ?Chest radiograph with left pleural effusion, mild cardiomegaly.  ?CT Chest with large left pleural effusion and left lower lobe atelectasis.  ? ?EKG 63 bpm, right axis deviation, qtc 548, right bundle branch block, with 1st degree AV block, sinus rhythm with no significant ST segment or T wave changes.  ? ?Patient was placed on furosemide for diuresis. ? ?03/26 thoracentesis on the right 1.2 L removed.  ? ?Patient has been responding to diuresis, but continue to have significant edema.  ?

## 2021-05-14 NOTE — Assessment & Plan Note (Addendum)
Calculated BMI is 54,33. ?He has a large ventral hernia on the left side and colostomy bag on the right side of his abdomen.  ?

## 2021-05-14 NOTE — Assessment & Plan Note (Addendum)
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 ?Patient is responding to diuresis well. ?Renal function with serum cr at 2,15 with K at 4,5 and serum bicarbonate at 28. ?Plan to continue diuresis for persistent hypervolemia.  ?Avoid hypotension and nephrotoxic medications.  ?

## 2021-05-14 NOTE — Assessment & Plan Note (Addendum)
Oxymetry is 92% on 2 L.min per [place], at home patient is on chronic supplemental 02 per [place] 3 L/min. ?   ?Continue to encourage mobility.  ?Out of bed to chair tid with meals ?PT and OT evaluation.  ? ?Discontinue steroids and antibiotics, symptoms likely related to volume overload. Pneumonia ruled out.  ?

## 2021-05-14 NOTE — Assessment & Plan Note (Addendum)
Leukopenia, anemia of iron deficiency combined with anemia of chronic disease.   ?Iron panel with serum iron 57, TIBC 361, transferrin saturation 16 and ferritin 258. ?Continue with oral iron supplementation.  ?

## 2021-05-14 NOTE — Procedures (Signed)
PROCEDURE SUMMARY: ? ?Successful image-guided left thoracentesis. ?Yielded 1.2 liters of serosanguineous fluid - procedure aborted at this amount due to patient c/o chest/throat pressure and persistent cough. Residual pleural fluid remains on post procedure [place]. ?Patient tolerated procedure well. ?EBL < 1 mL ?No immediate complications. ? ?Specimen was sent for labs. ?Post procedure CXR shows no pneumothorax. ? ?Please see imaging section of Epic for full dictation. ? ?Joaquim Nam PA-C ?05/14/2021 ?10:48 AM ? ? ? ?

## 2021-05-14 NOTE — Progress Notes (Signed)
Pt has home CPAP at bedside and states he will place himself on when ready. ?

## 2021-05-14 NOTE — Progress Notes (Signed)
PT Cancellation Note ? ?Patient Details ?Name: Jesse [name] ?MRN: [id number] ?DOB: 1950-09-13 ? ? ?Cancelled Treatment:    Reason Eval/Treat Not Completed: Patient declined, no reason specified. Pt very politely declining PT eval. He reported that he lives at an ALF where he currently receives PT services 1x/week. He also stated that he can increase those services at any time. He was agreeable for PT to check on him tomorrow for a possible eval.  ? ? ?[name] ?05/14/2021, 1:57 PM ?

## 2021-05-15 ENCOUNTER — Encounter (HOSPITAL_COMMUNITY): Payer: Self-pay | Admitting: Internal Medicine

## 2021-05-15 DIAGNOSIS — N179 Acute kidney failure, unspecified: Secondary | ICD-10-CM | POA: Diagnosis not present

## 2021-05-15 DIAGNOSIS — D649 Anemia, unspecified: Secondary | ICD-10-CM | POA: Diagnosis not present

## 2021-05-15 DIAGNOSIS — I5031 Acute diastolic (congestive) heart failure: Secondary | ICD-10-CM | POA: Diagnosis not present

## 2021-05-15 DIAGNOSIS — J9611 Chronic respiratory failure with hypoxia: Secondary | ICD-10-CM | POA: Diagnosis not present

## 2021-05-15 LAB — GLUCOSE, CAPILLARY
Glucose-Capillary: 123 mg/dL — ABNORMAL HIGH (ref 70–99)
Glucose-Capillary: 117 mg/dL — ABNORMAL HIGH (ref 70–99)
Glucose-Capillary: 177 mg/dL — ABNORMAL HIGH (ref 70–99)
Glucose-Capillary: 133 mg/dL — ABNORMAL HIGH (ref 70–99)

## 2021-05-15 LAB — IRON AND TIBC
Iron: 57 ug/dL (ref 45–182)
Saturation Ratios: 16 % — ABNORMAL LOW (ref 17.9–39.5)
TIBC: 361 ug/dL (ref 250–450)
UIBC: 304 ug/dL

## 2021-05-15 LAB — TRANSFERRIN: Transferrin: 258 mg/dL (ref 180–329)

## 2021-05-15 LAB — BASIC METABOLIC PANEL
Anion gap: 5 (ref 5–15)
BUN: 28 mg/dL — ABNORMAL HIGH (ref 8–23)
CO2: 27 mmol/L (ref 22–32)
Calcium: 8.3 mg/dL — ABNORMAL LOW (ref 8.9–10.3)
Chloride: 105 mmol/L (ref 98–111)
Creatinine, Ser: 2.09 mg/dL — ABNORMAL HIGH (ref 0.61–1.24)
GFR, Estimated: 33 mL/min — ABNORMAL LOW (ref >60)
Glucose, Bld: 118 mg/dL — ABNORMAL HIGH (ref 70–99)
Potassium: 4.7 mmol/L (ref 3.5–5.1)
Sodium: 137 mmol/L (ref 135–145)

## 2021-05-15 LAB — FERRITIN: Ferritin: 38 ng/mL (ref 24–336)

## 2021-05-15 LAB — PATHOLOGIST SMEAR REVIEW

## 2021-05-15 MED ORDER — ORAL CARE MOUTH RINSE
15.0000 mL | Freq: Two times a day (BID) | OROMUCOSAL | Status: DC
  Administered 2021-05-15 – 2021-05-21 (×13): 15 mL via OROMUCOSAL

## 2021-05-15 MED ORDER — FERROUS SULFATE 325 (65 FE) MG PO TABS
325.0000 mg | ORAL_TABLET | Freq: Every day | ORAL | Status: DC
  Administered 2021-05-16 – 2021-05-21 (×6): 325 mg via ORAL
  Filled 2021-05-15 (×6): qty 1

## 2021-05-15 MED ORDER — EMPAGLIFLOZIN 10 MG PO TABS
10.0000 mg | ORAL_TABLET | Freq: Every day | ORAL | Status: DC
Start: 2021-05-15 — End: 2021-05-21
  Administered 2021-05-15 – 2021-05-21 (×7): 10 mg via ORAL
  Filled 2021-05-15 (×7): qty 1

## 2021-05-15 MED ORDER — TRAMADOL HCL 50 MG PO TABS
50.0000 mg | ORAL_TABLET | Freq: Four times a day (QID) | ORAL | Status: DC | PRN
  Administered 2021-05-15 – 2021-05-21 (×8): 50 mg via ORAL
  Filled 2021-05-15 (×8): qty 1

## 2021-05-15 NOTE — Care Management Important Message (Signed)
Important Message ? ?Patient Details  ?Name: Jesse [name] ?MRN: [id number] ?Date of Birth: Aug 16, 1950 ? ? ?Medicare Important Message Given:  Yes ? ? ? ? ?Hendrik Donath ?05/15/2021, 3:57 PM ?

## 2021-05-15 NOTE — Progress Notes (Signed)
?Progress Note ? ? ?Patient: Jesse [name] KGY:[id number] DOB: 01/23/1951 DOA: 05/12/2021     2 ?DOS: the patient was seen and examined on 05/15/2021 ?  ?Brief hospital course: ?Jesse [name] was admitted to the hospital with the working diagnosis of decompensated heart failure.  ? ?Jesse [name] with the past medical history of obesity class 3, chronic kidney disease, hypertension, type 2 diabetes mellitus, sp colectomy/ colostomy, and chronic hypoxemic respiratory failure who presented with dyspnea and lower extremity edema. Reported 2 to 3 weeks of worsening dyspnea and lower extremity edema, decreased physical functional capacity having dyspnea with minimal efforts. Positive weight gain 20 lbs over last 3 months. On his initial physical examination his blood pressure was 134/46, HR 58, RR 18 and 25, 02 saturation 98%, lungs with decreased breath sounds more on the left side with diffuse wheezing, along with increased work of breathing, heart with S1 and S2 present and rhythmic, with no gallops or murmurs, abdomen soft and non tender, positive lower extremity edema ++.   ? ?Na 136, K 5,4. CL 104, bicarbonate 25, glucose 128, bun 40 cr 2,61  ?BNP 218 ?Wbc 3,7, hgb 9,6, hct 32,5 plt 157  ?Sars covid 19 negative  ? ?Chest radiograph with left pleural effusion, mild cardiomegaly.  ?CT Chest with large left pleural effusion and left lower lobe atelectasis.  ? ?EKG 63 bpm, right axis deviation, qtc 548, right bundle branch block, with 1st degree AV block, sinus rhythm with no significant ST segment or T wave changes.  ? ?Patient was placed on furosemide for diuresis. ? ?03/26 thoracentesis on the right 1.2 L removed.  ? ?Assessment and Plan: ?* Acute CHF (congestive heart failure) ([place]) ?Acute pulmonary edema with right pleural effusion.  ?Patient with significant volume overload.  ?Continue to have edema, dyspnea has improved after thoracentesis but not back to baseline.  ? ?Documented urine output 2,800 cc.  ?Blood  pressure systolic 026 range  ? ?Echocardiogram with LV EF 60 to 65%, with mild left ventricular hypertrophy, preserved RV systolic function. Left atrial size with moderate dilatation.  ?Mild to moderate mitral stenosis.   ? ?Patient responding well to diuresis, continue with IV furosemide.  ?Add empagliflozin  ? ? ? 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 ?Plan to continue diuresis with furosemide for volume overload.  ?Follow up renal function in am. Avoid hypotension and nephrotoxic medications.  ? ?Chronic respiratory failure with hypoxia ([place]) ?Oxymetry has improved to 97% on 2 L.min per [place], at home patient is on chronic supplemental 02 per [place] 3 L/min. ?   ?Continue to encourage mobility.  ?Out of bed to chair tid with meals ?PT and OT evaluation.  ? ?Discontinue steroids and antibiotics, symptoms likely related to volume overload. Pneumonia ruled out.  ? ?Type 2 diabetes mellitus with hyperlipidemia ([place]) ?Fasting glucose is 118 mg/dl.  ?Continue glucose cover and monitoring with insulin sliding scale.  ?Patient is tolerating po well.  ? ?On statin therapy.  ? ?OSA (obstructive sleep apnea) ?Continue with Cpap.  ? ?Chronic anemia ?Leukopenia, anemia of iron deficiency combined with anemia of chronic disease.   ?Iron panel with serum iron 57, TIBC 361, transferrin saturation 16 and ferritin 258. ?Plan to add oral iron supplementation.  ? ?Obesity, Class III, BMI 40-49.9 (morbid obesity) ([place]) ?Calculated BMI is 54,33. ?He has a large ventral  hernia on the left side and colostomy bag on the right side of his abdomen.  ? ? ? ? ?  ? ?Subjective: patient with improvement in edema but not back to baseline, no chest pain. Continue to have difficulty moving.  ? ?Physical Exam: ?Vitals:  ? 05/15/21 0336 05/15/21 0730 05/15/21 1021 05/15/21 1148  ?BP: (!) 125/53   129/67  ?Pulse: 60   63   ?Resp: 15   17  ?Temp: 97.7 ?F (36.5 ?C)  98.5 ?F (36.9 ?C) 98 ?F (36.7 ?C)  ?TempSrc: Oral   Oral  ?SpO2: 99% 97%  90%  ?Weight: (!) 177.9 kg     ?Height:      ? ?Neurology awake and alert ?ENT with mild pallor ?Cardiovascular with S1 and S2 present and rhythmic with no gallops or murmurs.  ?No JVD ?Positive lower extremity edema +++ ?Respiratory with no wheezing or rhonchi ? Abdomen protuberant but not distended.  ?Data Reviewed: ? ? ? ?Family Communication: no family at the bedside  ? ?Disposition: ?Status is: Inpatient ?Remains inpatient appropriate because: heart failure  ? Planned Discharge Destination: Home ? ?Author: ?Tawni Millers, MD ?05/15/2021 1:11 PM ? ?For on call review [email or web address].  ?

## 2021-05-15 NOTE — Progress Notes (Signed)
Central monitor called that patient had episode of 3rd degree on monitor, checked patient lead was not properly placed, 12 lead ekg done. See shadow chart and epic for ekg. Patient denies cp, dizziness. Report given to night RN. ?

## 2021-05-15 NOTE — Plan of Care (Signed)
  Problem: Education: Goal: Knowledge of General Education information will improve Description: Including pain rating scale, medication(s)/side effects and non-pharmacologic comfort measures Outcome: Progressing   Problem: Health Behavior/Discharge Planning: Goal: Ability to manage health-related needs will improve Outcome: Progressing   Problem: Activity: Goal: Risk for activity intolerance will decrease Outcome: Progressing   Problem: Clinical Measurements: Goal: Cardiovascular complication will be avoided Outcome: Progressing   

## 2021-05-15 NOTE — Plan of Care (Signed)
°  Problem: Education: °Goal: Knowledge of General Education information will improve °Description: Including pain rating scale, medication(s)/side effects and non-pharmacologic comfort measures °Outcome: Progressing °  °Problem: Health Behavior/Discharge Planning: °Goal: Ability to manage health-related needs will improve °Outcome: Progressing °  °Problem: Clinical Measurements: °Goal: Cardiovascular complication will be avoided °Outcome: Progressing °  °

## 2021-05-15 NOTE — TOC Initial Note (Addendum)
Transition of Care (TOC) - Initial/Assessment Note  ? ? ?Patient Details  ?Name: Jesse [name] ?MRN: [id number] ?Date of Birth: 11/17/1950 ? ?Transition of Care (TOC) CM/SW Contact:    ?Zenon Mayo, RN ?Phone Number: ?05/15/2021, 2:25 PM ? ?Clinical Narrative:                 ?NCM spoke with patient at the bedside, he lives at [facility], he has electric w/chair, tilt bed and lift chair and home oxygen 2 liters with Inogen.  He states he will need ambulance transport at discharge, his electric chair is at home. And he was brought here by ambulance.  He has a colostomy and does his own colostomy care.  He uses a cpap at night, he conts on iv lasix today.  NCM left vm for [facility] at [phone], and their fax number is [phone].  NCM  faxed HHPT order  to [facility].  ? ?Expected Discharge Plan: [place] ?Barriers to Discharge: Continued Medical Work up ? ? ?Patient Goals and CMS Choice ?Patient states their goals for this hospitalization and ongoing recovery are:: return to [place] ?  ?Choice offered to / list presented to : NA ? ?Expected Discharge Plan and Services ?Expected Discharge Plan: [place] ?  ?Discharge Planning Services: CM Consult ?Post Acute Care Choice: Resumption of Svcs/PTA Provider ?Living arrangements for the past 2 months: [place] ?                ?  ?DME Agency: NA ?  ?  ?  ?HH Arranged: PT ?[place] Agency:  Secondary school teacher on site) ?  ?  ?  ? ?Prior Living Arrangements/Services ?Living arrangements for the past 2 months: [place] ?Lives with:: Self ?Patient language and need for interpreter reviewed:: Yes ?Do you feel safe going back to the place where you live?: Yes      ?Need for Family Participation in Patient Care: No (Comment) ?Care giver support system in place?: Yes (comment) ?Current home services: DME (electric w/chair, tilt bed, home oxygen with Inogen (2 liters)) ?Criminal Activity/Legal  Involvement Pertinent to Current Situation/Hospitalization: No - Comment as needed ? ?Activities of Daily Living ?  ?  ? ?Permission Sought/Granted ?  ?  ?   ?   ?   ?   ? ?Emotional Assessment ?Appearance:: Appears stated age ?Attitude/Demeanor/Rapport: Engaged ?Affect (typically observed): Appropriate ?Orientation: : Oriented to  Time, Oriented to Situation, Oriented to Place, Oriented to Self ?Alcohol / Substance Use: Not Applicable ?Psych Involvement: No (comment) ? ?Admission diagnosis:  Peripheral edema [R60.9] ?Pleural effusion [J90] ?Acute CHF (congestive heart failure) (HCC) [I50.9] ?AKI (acute kidney injury) ([place]) [N17.9] ?CHF (congestive heart failure) ([place]) [I50.9] ?Patient Active Problem List  ? Diagnosis Date Noted  ? Type 2 diabetes mellitus with hyperlipidemia ([place]) 05/14/2021  ? Chronic anemia 05/14/2021  ? Acute kidney injury superimposed on chronic kidney disease ([place]) 05/13/2021  ? OSA (obstructive sleep apnea) 05/13/2021  ? Chronic respiratory failure with hypoxia ([place]) 05/13/2021  ? Obesity, Class III, BMI 40-49.9 (morbid obesity) ([place]) 05/13/2021  ? Acute CHF (congestive heart failure) ([place]) 05/12/2021  ? Chronic pain of both knees 12/08/2020  ? Anxiety 12/08/2020  ? Type 2 diabetes mellitus ([place])   ? Former cigarette smoker   ? Alcohol use disorder in remission   ? Hard of hearing   ? Abscess 04/01/2015  ? Anemia 03/07/2015  ? 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 Ventral hernia 03/07/2015  ? Atrial ectopy 02/26/2015  ? Bowel obstruction ([place]) 02/23/2015  ? ?PCP:  Ngetich, Nelda Bucks, NP ?Pharmacy:   ?[facility] [place], [address] ?[place] ?[place] [address] ?Phone: [phone] Fax: [phone] ? ? ? ? ?Social Determinants of Health (SDOH) Interventions ?Food Insecurity Interventions: Intervention Not Indicated ?Financial Strain Interventions: Intervention Not Indicated ?Housing Interventions: Intervention Not  Indicated ?Transportation Interventions:  (would like to speak about transportation with CSW) ? ?Readmission Risk Interventions ? ?  05/15/2021  ?  2:21 PM  ?Readmission Risk Prevention Plan  ?Transportation Screening Complete  ?PCP or Specialist Appt within 3-5 Days Complete  ?[place] or Home Care Consult Complete  ?Social Work Consult for [place] Planning/Counseling Complete  ?Palliative Care Screening Not Applicable  ?Medication Review Press photographer) Complete  ? ? ? ?

## 2021-05-15 NOTE — Progress Notes (Signed)
Heart Failure Navigation Team ?Progress Note ? ?PCP: Ngetich, Nelda Bucks, NP ?Primary Cardiologist: N/A ?Admitted from: home ? ?Past Medical History:  ?Diagnosis Date  ? Alcohol use disorder in remission   ? Anxiety and depression   ? At high risk for falls   ? Former cigarette smoker   ? Generalized anxiety disorder   ? Hard of hearing   ? Hyperlipidemia due to type 2 diabetes mellitus ([place])   ? Hypertension   ? Morbid obesity ([place])   ? Neuropathy   ? O2 dependent   ? Risk for falls   ? Sleep apnea   ? Type 2 diabetes mellitus ([place])   ? ? ?Social History  ? ?Socioeconomic History  ? Marital status: Divorced  ?  Spouse name: Not on file  ? Number of children: 3  ? Years of education: Not on file  ? Highest education level: Master's degree (e.g., MA, MS, MEng, MEd, MSW, MBA)  ?Occupational History  ? Occupation: Retired  ?Tobacco Use  ? Smoking status: Former  ?  Packs/day: 1.00  ?  Types: Cigarettes  ?  Quit date: 1999  ?  Years since quitting: 24.2  ? Smokeless tobacco: Never  ?Vaping Use  ? Vaping Use: Never used  ?Substance and Sexual Activity  ? Alcohol use: Not Currently  ? Drug use: Never  ? Sexual activity: Not on file  ?Other Topics Concern  ? Not on file  ?Social History Narrative  ? Tobacco use, amount per day now: 0  ? Past tobacco use, amount per day: 1 pack  ? How many years did you use tobacco: 10 last 1999  ? Alcohol use (drinks per week): 1/5 day last 1999  ? Diet:  ? Do you drink/eat things with caffeine: Yes  ? Marital status:   Divorced                               What year were you married? 1980  ? Do you live in a house, apartment, assisted living, condo, trailer, etc.?   ? Is it one or more stories? 1  ? How many persons live in your home? 1  ? Do you have pets in your home?( please list) Cat  ? Highest Level of education completed? Post Grad  ? Current or past profession: Insurance account manager.  ? Do you exercise? Little                                 Type and how often?  ? Do you have a living  will? Yes  ? Do you have a DNR form?        No                           If not, do you want to discuss one?  ? Do you have signed POA/HPOA forms?   Yes                     If so, please bring to you appointment  ?   ? Do you have any difficulty bathing or dressing yourself? Yes  ? Do you have any difficulty preparing food or eating? No  ? Do you have any difficulty managing your medications? No  ? Do you have any difficulty managing your finances?  No  ? Do you have any difficulty affording your medications?  No  ? ?Social Determinants of Health  ? ?Financial Resource Strain: Low Risk   ? Difficulty of Paying Living Expenses: Not hard at all  ?Food Insecurity: No Food Insecurity  ? Worried About Charity fundraiser in the Last Year: Never true  ? Ran Out of Food in the Last Year: Never true  ?Transportation Needs: No Transportation Needs  ? Lack of Transportation (Medical): No  ? Lack of Transportation (Non-Medical): No  ?Physical Activity: Not on file  ?Stress: Not on file  ?Social Connections: Not on file  ? ? ? ?Heart & Vascular Transition of Care Clinic follow-up: ?Scheduled for 05/30/21.  ?Confirmed transportation needed at time of appointment. ? ?Immediate social needs: transportation ? ?HF CSW spoke with Mr. Bunt at bedside about transportation resources. He was not very pleased to hear about [facility] [employer] dissolving and CSW offered others options for resources although Mr. Spira is skeptical of how far he will get other options. ? ?[place], MSW, LCSW ?[phone] ?Heart Failure Social Worker  ?

## 2021-05-15 NOTE — Progress Notes (Signed)
Pt placed self on home CPAP. Will call if help is needed. ?

## 2021-05-15 NOTE — Consult Note (Addendum)
[place] Nurse ostomy consult note ?Pt is independent with colostomy pouching; he states he had first colostomy surgery performed in approx 1972 and had a revision to the right abd many years ago. He has a significant hernia to the left abd and has a significant crease to the right abd when his pouch is removed.  He performed the pouch change without assistance, but requests some cleaning supplies be provided.  He has brought his own pouching supplies from home.  He uses a 2 piece convex pouching system by [employer], precut to 1 1/2 inches, paste, ostomy powder, barrier strips around the outer pouch.  ?His stoma is 1 1/2 inches, flush with skin level, located in a significant crease. There are red raised vascular lesions surrounding the stoma which bleed small amt when he changed the pouch, Pt states this is a usual occurrence.  ?Output: 50cc thick brown stool, current pouch was leaking behind the barrier.  Pt states he usually has a 4 day wear time.  ?Ostomy pouching: 2pc.  ?Pt states he does not need any further supplies ordered and has his own at the bedside.  He denies further questions or need for assistance.  He states he will call the bedside nurses for assistance with emptying the pouch, since he cannot get OOB. ?Please re-consult if further assistance is needed.  Thank-you,  ?Julien Girt MSN, RN, [place], [place], CNS ?[phone]  ?

## 2021-05-15 NOTE — Evaluation (Signed)
Physical Therapy Evaluation ?Patient Details ?Name: Jesse [name] ?MRN: [id number] ?DOB: 1951/02/04 ?Today's Date: 05/15/2021 ? ?History of Present Illness ? The pt is a 71 yo male presenting 3/24 with SOB, 12 kg wt increase, and bilateral LE swelling. Pt found to have L pleural effusion, elevated BNP. PMH includes: morbid obesity, CKD III, chronic hypoxic resp failure on 2L O2, HTN, colectomy and colostomy, and OSA. ?  ?Clinical Impression ? Pt in bed upon arrival of PT, agreeable to evaluation at this time. Prior to admission the pt was living at ALF, transferring to power [facility] independently, and using WC for mobility. The pt reports recent injury to L hamstring which is his primary concern with mobility at this time. The pt does present with limitations in PROM of L knee and hip, limited both by soft tissue and guarding due to pain at this point. The pt was able to complete transition to sitting EOB without assist and complete lateral scooting along EOB, but declined further OOB at this time. Will benefit from continued skilled PT to address L hamstring pain and maintain strength for transfers to maintain independence at ALF.  ?   ? ?Recommendations for follow up therapy are one component of a multi-disciplinary discharge planning process, led by the attending physician.  Recommendations may be updated based on patient status, additional functional criteria and insurance authorization. ? ?Follow Up Recommendations Home health PT (at ALF) ? ?  ?Assistance Recommended at Discharge Intermittent Supervision/Assistance  ?Patient can return home with the following ? A little help with walking and/or transfers;A little help with bathing/dressing/bathroom;Assistance with cooking/housework;Direct supervision/assist for medications management;Assist for transportation;Help with stairs or ramp for entrance ? ?  ?Equipment Recommendations None recommended by PT  ?Recommendations for Other Services ?    ?  ?Functional Status  Assessment Patient has had a recent decline in their functional status and demonstrates the ability to make significant improvements in function in a reasonable and predictable amount of time.  ? ?  ?Precautions / Restrictions Precautions ?Precautions: Fall ?Restrictions ?Weight Bearing Restrictions: No  ? ?  ? ?Mobility ? Bed Mobility ?Overal bed mobility: Needs Assistance ?Bed Mobility: Supine to Sit, Sit to Supine ?  ?  ?Supine to sit: Modified independent (Device/Increase time) ?Sit to supine: Min assist ?  ?General bed mobility comments: able to come to sitting EOB without assist, minA to move LLR back into bed. ?  ? ?Transfers ?Overall transfer level: Needs assistance ?Equipment used: None ?Transfers: Bed to chair/wheelchair/BSC ?  ?  ?  ?  ?  ? Lateral/Scoot Transfers: Supervision ?General transfer comment: minimal hip clearance but able to complete without assist ?  ? ?Ambulation/Gait ?  ?  ?  ?  ?  ?  ?  ?General Gait Details: pt non ambulatory at baseline ? ?  ? ?Balance Overall balance assessment: Mild deficits observed, not formally tested ?  ?  ?  ?  ?  ?  ?  ?  ?  ?  ?  ?  ?  ?  ?  ?  ?  ?  ?   ? ? ? ?Pertinent Vitals/Pain Pain Assessment ?Pain Assessment: Faces ?Faces Pain Scale: Hurts even more ?Pain Location: L hamstring. ?Pain Descriptors / Indicators: Burning, Grimacing ?Pain Intervention(s): Limited activity within patient's tolerance, Repositioned, Monitored during session, Ice applied  ? ? ?Home Living Family/patient expects to be discharged to:: Assisted living ?  ?  ?  ?  ?  ?  ?  ?  ?  Home Equipment: Wheelchair - power ?Additional Comments: pt using power WC for mobility, able to complete transfers independently. receives PT at facility  ?  ?Prior Function Prior Level of Function : Independent/Modified Independent ?  ?  ?  ?  ?  ?  ?Mobility Comments: use of power WC but completes transfers independently. reports HS strain ~5 days ago ?  ?  ? ? ?Hand Dominance  ?   ? ?  ?Extremity/Trunk  Assessment  ? Upper Extremity Assessment ?Upper Extremity Assessment: Overall WFL for tasks assessed ?  ? ?Lower Extremity Assessment ?Lower Extremity Assessment: RLE deficits/detail;LLE deficits/detail ?RLE Deficits / Details: significant edema and red patchy areas on LE. able to SLR against gravity and complete AROM with limitations only due to soft tissue restriction from body habitus ?RLE Sensation: WNL ?LLE Deficits / Details: significant edema and red patchy areas on LE. able to SLR against gravity HS pain with active knee flexion, relaxed some with passive HS stretching ?LLE: Unable to fully assess due to pain ?LLE Sensation: WNL ?  ? ?Cervical / Trunk Assessment ?Cervical / Trunk Assessment: Other exceptions ?Cervical / Trunk Exceptions: large body habitus  ?Communication  ? Communication: No difficulties  ?Cognition Arousal/Alertness: Awake/alert ?Behavior During Therapy: [facility] for tasks assessed/performed ?Overall Cognitive Status: Within Functional Limits for tasks assessed ?  ?  ?  ?  ?  ?  ?  ?  ?  ?  ?  ?  ?  ?  ?  ?  ?  ?  ?  ? ?  ?General Comments General comments (skin integrity, edema, etc.): VSS on 2L ? ?  ?Exercises Other Exercises ?Other Exercises: 90-90 HS stretch and PROM to LLE ?Other Exercises: PROM knee extension sitting EOB  ? ?Assessment/Plan  ?  ?PT Assessment Patient needs continued PT services  ?PT Problem List Decreased strength;Decreased range of motion;Decreased activity tolerance;Decreased balance;Decreased mobility ? ?   ?  ?PT Treatment Interventions DME instruction;Gait training;Stair training;Functional mobility training;Therapeutic activities;Therapeutic exercise;Patient/family education   ? ?PT Goals (Current goals can be found in the Care Plan section)  ?Acute Rehab PT Goals ?Patient Stated Goal: reduce pain in left hamstring ?PT Goal Formulation: With patient ?Time For Goal Achievement: 05/29/21 ?Potential to Achieve Goals: Good ? ?  ?Frequency Min 2X/week ?  ? ? ?   ?AM-PAC  PT "6 Clicks" Mobility  ?Outcome Measure Help needed turning from your back to your side while in a flat bed without using bedrails?: None ?Help needed moving from lying on your back to sitting on the side of a flat bed without using bedrails?: A Little ?Help needed moving to and from a bed to a chair (including a wheelchair)?: A Little ?Help needed standing up from a chair using your arms (e.g., wheelchair or bedside chair)?: A Lot ?Help needed to walk in hospital room?: Total ?Help needed climbing 3-5 steps with a railing? : Total ?6 Click Score: 14 ? ?  ?End of Session Equipment Utilized During Treatment: Oxygen ?Activity Tolerance: Patient tolerated treatment well ?Patient left: in bed;with call bell/phone within reach ?Nurse Communication: Mobility status ?PT Visit Diagnosis: Muscle weakness (generalized) (M62.81);Pain ?Pain - Right/Left: Left ?Pain - part of body: Leg ?  ? ?Time: 7341-9379 ?PT Time Calculation (min) (ACUTE ONLY): 25 min ? ? ?Charges:   PT Evaluation ?$PT Eval Low Complexity: 1 Low ?PT Treatments ?$Therapeutic Exercise: 8-22 mins ?  ?   ? ? ?[place] Carbo, PT, DPT  ? ?Acute Rehabilitation Department ?Pager #: (  [phone] ? ?Sandra Cockayne ?05/15/2021, 1:18 PM ? ?

## 2021-05-15 NOTE — Progress Notes (Signed)
Heart Failure Nurse Navigator Progress Note ? ?PCP: Ngetich, Nelda Bucks, NP ?PCP-Cardiologist: NA ?Admission Diagnosis: SOB, Leg swelling ?Admitted from: [place] ( [facility]) ? ?Presentation:   ?Jesse [name] presented with SOB, Leg swelling , has chronic leg swelling x 1 year, however feel it has gotten worse or the last 3 months. He denies any chest pain, however felt like he was wheezing. He recently moved back to the area and into [facility]. He uses a motorized scooter and does his ADL's. The cooking is done thru the dinning hall where he takes his meals. States he has options to choosing his meals including a heart healthy plan daily. Spoke in detail about his fluid intake and diet choices, he feels as if he does well and is unsure where all the fluid came from.  ?Patient has a colectomy/colostomy and a large hernia of the abdomen. He has seen wound care for evaluation of his stoma and care, no further needs . Patient had concerns with getting transportation arranged for his appointments since his daughter recently got a promotion and is working in [employer]. He also had concerns with getting set up with a new PCP and various other doctors. CSW was notified and is working with patient to come up with a plan. Patient is scheduled to come in to [facility] on 05/30/21 @ 2pm.  ? ?ECHO/ LVEF: 60-65% ? ?Clinical Course: ? ?Past Medical History:  ?Diagnosis Date  ? Alcohol use disorder in remission   ? Anxiety and depression   ? At high risk for falls   ? Former cigarette smoker   ? Generalized anxiety disorder   ? Hard of hearing   ? Hyperlipidemia due to type 2 diabetes mellitus ([place])   ? Hypertension   ? Morbid obesity ([place])   ? Neuropathy   ? O2 dependent   ? Risk for falls   ? Sleep apnea   ? Type 2 diabetes mellitus ([place])   ?  ? ?Social History  ? ?Socioeconomic History  ? Marital status: Divorced  ?  Spouse name: Not on file  ? Number of children: 3  ? Years of education:  Not on file  ? Highest education level: Master's degree (e.g., MA, MS, MEng, MEd, MSW, MBA)  ?Occupational History  ? Occupation: Retired  ?Tobacco Use  ? Smoking status: Former  ?  Packs/day: 1.00  ?  Types: Cigarettes  ?  Quit date: 1999  ?  Years since quitting: 24.2  ? Smokeless tobacco: Never  ?Vaping Use  ? Vaping Use: Never used  ?Substance and Sexual Activity  ? Alcohol use: Not Currently  ? Drug use: Never  ? Sexual activity: Not on file  ?Other Topics Concern  ? Not on file  ?Social History Narrative  ? Tobacco use, amount per day now: 0  ? Past tobacco use, amount per day: 1 pack  ? How many years did you use tobacco: 10 last 1999  ? Alcohol use (drinks per week): 1/5 day last 1999  ? Diet:  ? Do you drink/eat things with caffeine: Yes  ? Marital status:   Divorced                               What year were you married? 1980  ? Do you live in a house, apartment, assisted living, condo, trailer, etc.?   ? Is it one or more stories?  1  ? How many persons live in your home? 1  ? Do you have pets in your home?( please list) Cat  ? Highest Level of education completed? Post Grad  ? Current or past profession: Insurance account manager.  ? Do you exercise? Little                                 Type and how often?  ? Do you have a living will? Yes  ? Do you have a DNR form?        No                           If not, do you want to discuss one?  ? Do you have signed POA/HPOA forms?   Yes                     If so, please bring to you appointment  ?   ? Do you have any difficulty bathing or dressing yourself? Yes  ? Do you have any difficulty preparing food or eating? No  ? Do you have any difficulty managing your medications? No  ? Do you have any difficulty managing your finances? No  ? Do you have any difficulty affording your medications?  No  ? ?Social Determinants of Health  ? ?Financial Resource Strain: Low Risk   ? Difficulty of Paying Living Expenses: Not hard at all  ?Food Insecurity: No Food Insecurity  ?  Worried About Charity fundraiser in the Last Year: Never true  ? Ran Out of Food in the Last Year: Never true  ?Transportation Needs: No Transportation Needs  ? Lack of Transportation (Medical): No  ? Lack of Transportation (Non-Medical): No  ?Physical Activity: Not on file  ?Stress: Not on file  ?Social Connections: Not on file  ? ? ?High Risk Criteria for Readmission and/or Poor Patient Outcomes: ?Heart failure hospital admissions (last 6 months):   ?No Show rate: NA ?Difficult social situation: No ?Demonstrates medication adherence: yes ?Primary Language: English ?Literacy level: reading, writing, comprehends ? ?Barriers of Care:   ?Transportation to appointments ? ?Considerations/Referrals:  ? ?Referral made to Heart Failure Pharmacist Stewardship: yes, optimize ?Referral made to Heart Failure CSW/NCM TOC: yes, transportation needs to/from appointments ?Referral made to Heart & Vascular TOC clinic: yes, 05/30/21 ? ?Items for Follow-up on DC/TOC: ?Optimize medication ?Transportation with his insurance ? ? ? ?Earnestine Leys, BSN, RN ?Heart Failure Nurse Navigator ?[phone]   ?

## 2021-05-16 ENCOUNTER — Other Ambulatory Visit (HOSPITAL_COMMUNITY): Payer: Self-pay

## 2021-05-16 DIAGNOSIS — D649 Anemia, unspecified: Secondary | ICD-10-CM | POA: Diagnosis not present

## 2021-05-16 DIAGNOSIS — J9611 Chronic respiratory failure with hypoxia: Secondary | ICD-10-CM | POA: Diagnosis not present

## 2021-05-16 DIAGNOSIS — N179 Acute kidney failure, unspecified: Secondary | ICD-10-CM | POA: Diagnosis not present

## 2021-05-16 DIAGNOSIS — I5031 Acute diastolic (congestive) heart failure: Secondary | ICD-10-CM | POA: Diagnosis not present

## 2021-05-16 LAB — BASIC METABOLIC PANEL
Anion gap: 7 (ref 5–15)
BUN: 29 mg/dL — ABNORMAL HIGH (ref 8–23)
CO2: 28 mmol/L (ref 22–32)
Calcium: 8.5 mg/dL — ABNORMAL LOW (ref 8.9–10.3)
Chloride: 103 mmol/L (ref 98–111)
Creatinine, Ser: 2.15 mg/dL — ABNORMAL HIGH (ref 0.61–1.24)
GFR, Estimated: 32 mL/min — ABNORMAL LOW (ref >60)
Glucose, Bld: 130 mg/dL — ABNORMAL HIGH (ref 70–99)
Potassium: 4.5 mmol/L (ref 3.5–5.1)
Sodium: 138 mmol/L (ref 135–145)

## 2021-05-16 LAB — GLUCOSE, CAPILLARY
Glucose-Capillary: 115 mg/dL — ABNORMAL HIGH (ref 70–99)
Glucose-Capillary: 127 mg/dL — ABNORMAL HIGH (ref 70–99)
Glucose-Capillary: 147 mg/dL — ABNORMAL HIGH (ref 70–99)
Glucose-Capillary: 166 mg/dL — ABNORMAL HIGH (ref 70–99)

## 2021-05-16 NOTE — Progress Notes (Signed)
Patient states right big toe has been really hurting him tonight, he bumped it last week and he said it was "bleeding profusely". Dried blood still on toe. ?

## 2021-05-16 NOTE — Progress Notes (Signed)
?Progress Note ? ? ?Patient: Jesse [name] OTL:[id number] DOB: 1950/07/25 DOA: 05/12/2021     3 ?DOS: the patient was seen and examined on 05/16/2021 ?  ?Brief hospital course: ?Jesse [name] was admitted to the hospital with the working diagnosis of decompensated heart failure.  ? ?71 yo male with the past medical history of obesity class 3, chronic kidney disease, hypertension, type 2 diabetes mellitus, sp colectomy/ colostomy, ambulatory dysfunction and chronic hypoxemic respiratory failure who presented with dyspnea and lower extremity edema. Reported 2 to 3 weeks of worsening dyspnea and lower extremity edema, decreased physical functional capacity having dyspnea with minimal efforts. Positive weight gain 20 lbs over last 3 months. On his initial physical examination his blood pressure was 134/46, HR 58, RR 18 and 25, 02 saturation 98%, lungs with decreased breath sounds more on the left side with diffuse wheezing, along with increased work of breathing, heart with S1 and S2 present and rhythmic, with no gallops or murmurs, abdomen soft and non tender, positive lower extremity edema ++.   ? ?Na 136, K 5,4. CL 104, bicarbonate 25, glucose 128, bun 40 cr 2,61  ?BNP 218 ?Wbc 3,7, hgb 9,6, hct 32,5 plt 157  ?Sars covid 19 negative  ? ?Chest radiograph with left pleural effusion, mild cardiomegaly.  ?CT Chest with large left pleural effusion and left lower lobe atelectasis.  ? ?EKG 63 bpm, right axis deviation, qtc 548, right bundle branch block, with 1st degree AV block, sinus rhythm with no significant ST segment or T wave changes.  ? ?Patient was placed on furosemide for diuresis. ? ?03/26 thoracentesis on the right 1.2 L removed.  ? ?Patient has been responding to diuresis, but continue to have significant edema.  ? ?Assessment and Plan: ?* Acute CHF (congestive heart failure) ([place]) ?Echocardiogram with LV EF 60 to 65%, with mild left ventricular hypertrophy, preserved RV systolic function. Left atrial size with  moderate dilatation.  ?Mild to moderate mitral stenosis.  ? ?Acute cardiogenic pulmonary edema with right pleural effusion.  ? ?Urine output 4,350 ml over last 24 hrs.  ?Blood pressure systolic 974 to 163 mmHg.  ? ?Continue diuresis with furosemide, continue with empagliflozin.  ?Continue to be hypervolemic.  ? ? 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 ?Patient is responding to diuresis well. ?Renal function with serum cr at 2,15 with K at 4,5 and serum bicarbonate at 28. ?Plan to continue diuresis for persistent hypervolemia.  ?Avoid hypotension and nephrotoxic medications.  ? ?Chronic respiratory failure with hypoxia ([place]) ?Oxymetry is 92% on 2 L.min per Jesse [name], at home patient is on chronic supplemental 02 per Jesse [name] 3 L/min. ?   ?Continue to encourage mobility.  ?Out of bed to chair tid with meals ?PT and OT evaluation.  ? ?Discontinue steroids and antibiotics, symptoms likely related to volume overload. Pneumonia ruled out.  ? ?Type 2 diabetes mellitus with hyperlipidemia ([place]) ?Fasting glucose is 130 mg/dl.  ?Continue glucose cover and monitoring with insulin sliding scale.  ?Patient is tolerating po well.  ? ?Continue with statin therapy.  ? ?OSA (obstructive sleep apnea) ?Continue with Cpap.  ? ?Chronic anemia ?Leukopenia, anemia of iron deficiency combined with anemia of chronic disease.   ?Iron panel with serum iron 57, TIBC 361, transferrin saturation 16 and ferritin 258. ?Continue with oral iron supplementation.  ? ?Obesity, Class III, BMI 40-49.9 (morbid obesity) ([place]) ?Calculated BMI is 54,33. ?He has a large ventral hernia on the left  side and colostomy bag on the right side of his abdomen.  ? ? ? ? ?  ? ?Subjective: patient is feeling better, dyspnea has improved and lower extremity edema is improving but not back to baseline.  ? ?Physical Exam: ?Vitals:  ? 05/16/21 0436 05/16/21 0726 05/16/21 0815 05/16/21 1130  ?BP:   117/61 117/64  ?Pulse:   71 67   ?Resp:   19 18  ?Temp:   98.5 ?F (36.9 ?C) 98.7 ?F (37.1 ?C)  ?TempSrc:   Oral Oral  ?SpO2:  96% 95% 92%  ?Weight: (!) 174.5 kg     ?Height:      ? ?Neurology awake and alert ?ENT with no pallor ?Cardiovascular with S1 and S2 present and rhythmic with no gallops or murmurs, no rubs ?No JVD ?Positive lower extremity edema +++ pitting bilaterally ?Respiratory with no rales or wheezing ?Abdomen protuberant, hernia on the right side and left colostomy bag.  ?Data Reviewed: ? ? ? ?Family Communication: no family at the bedside  ? ?Disposition: ?Status is: Inpatient ?Remains inpatient appropriate because: volume overload heart failure  ? Planned Discharge Destination: Home ? ? ? ?Author: ?Tawni Millers, MD ?05/16/2021 3:59 PM ? ?For on call review [email or web address].  ?

## 2021-05-16 NOTE — Progress Notes (Signed)
Heart Failure Stewardship Pharmacist Progress Note ? ? ?PCP: Ngetich, [name] C, NP ?PCP-Cardiologist: None  ? ? ?HPI:  ?71 yo M with PMH of obesity, CKD, chronic respiratory failure, HTN, OSA, and T2DM. He presented to the ED on 3/24 with shortness of breath, 20 lb weight gain over 3 months, and LE edema. CXR with mild cardiomegaly and pleural effusions (L>R). An ECHO was done 3/26 and LVEF 60-65%. Thoracentesis (R) removed 1.2L. ? ?Current HF Medications: ?Diuretic: furosemide 60 mg IV BID ?SGLT2i: Jardiance 10 mg daily ? ?Prior to admission HF Medications: ?Diuretic: bumetanide 1 mg BID; metolazone 2.5 mg twice per week ?ACE/ARB/ARNI: valsartan 40 mg qhs ?SGLT2i: Jardiance 25 mg daily ?Other: Imdur 60 mg daily ? ?Pertinent Lab Values: ?Serum creatinine 2.15, BUN 29, Potassium 4.5, Sodium 138, BNP 218.8, A1c 5.9  ? ?Vital Signs: ?Weight: 384 lbs (admission weight: 401 lbs) ?Blood pressure: 110/60s  ?Heart rate: 70s  ?I/O: -3.7L yesterday; net -7.4L ? ?Medication Assistance / Insurance Benefits Check: ?Does the patient have prescription insurance?  Yes ?Type of insurance plan: [employer] Medicare  ? ?Outpatient Pharmacy:  ?Prior to admission outpatient pharmacy: [facility] ?Is the patient willing to use [facility] pharmacy at discharge? Yes ?Is the patient willing to transition their outpatient pharmacy to utilize a [facility] outpatient pharmacy?   Pending ?  ? ?Assessment: ?1. Acute on chronic diastolic CHF (EF 21-30%). NYHA class III symptoms. ?- Continue furosemide 60 mg IV BID - weight down and good urine output  ?- Holding PTA valsartan with AKI. SCr up to 2.15 today ?- Caution adding spironolactone with AKI and elevated K up to 5.4 on admission ?- Continue Jardiance 10 mg daily ?- On Actos PTA - consider stopping on discharge ?  ?Plan: ?1) Medication changes recommended at this time: ?- Continue current regimen ? ?2) Patient assistance: ?- Entresto copay $40 ?- Jardiance copay $40 ? ?3)  Education  ?- To be completed  prior to discharge ? ?Kerby Nora, PharmD, BCPS ?Heart Failure Stewardship Pharmacist ?Phone [phone] ? ? ?

## 2021-05-17 ENCOUNTER — Inpatient Hospital Stay (HOSPITAL_COMMUNITY): Payer: Medicare PPO

## 2021-05-17 DIAGNOSIS — I5031 Acute diastolic (congestive) heart failure: Secondary | ICD-10-CM | POA: Diagnosis not present

## 2021-05-17 LAB — BASIC METABOLIC PANEL
Anion gap: 7 (ref 5–15)
BUN: 29 mg/dL — ABNORMAL HIGH (ref 8–23)
CO2: 27 mmol/L (ref 22–32)
Calcium: 8.2 mg/dL — ABNORMAL LOW (ref 8.9–10.3)
Chloride: 104 mmol/L (ref 98–111)
Creatinine, Ser: 1.96 mg/dL — ABNORMAL HIGH (ref 0.61–1.24)
GFR, Estimated: 36 mL/min — ABNORMAL LOW (ref >60)
Glucose, Bld: 107 mg/dL — ABNORMAL HIGH (ref 70–99)
Potassium: 4.2 mmol/L (ref 3.5–5.1)
Sodium: 138 mmol/L (ref 135–145)

## 2021-05-17 LAB — GLUCOSE, CAPILLARY
Glucose-Capillary: 103 mg/dL — ABNORMAL HIGH (ref 70–99)
Glucose-Capillary: 123 mg/dL — ABNORMAL HIGH (ref 70–99)
Glucose-Capillary: 139 mg/dL — ABNORMAL HIGH (ref 70–99)
Glucose-Capillary: 142 mg/dL — ABNORMAL HIGH (ref 70–99)

## 2021-05-17 LAB — MAGNESIUM: Magnesium: 2.2 mg/dL (ref 1.7–2.4)

## 2021-05-17 MED ORDER — GERHARDT'S BUTT CREAM
TOPICAL_CREAM | CUTANEOUS | Status: DC | PRN
  Filled 2021-05-17: qty 1

## 2021-05-17 NOTE — Progress Notes (Signed)
Mobility Specialist Progress Note: ? ? 05/17/21 1000  ?Mobility  ?Activity Transferred from bed to chair  ?Level of Assistance Minimal assist, patient does 75% or more  ?Assistive Device Front wheel walker  ?Distance Ambulated (ft) 2 ft  ?Activity Response Tolerated well  ?$Mobility charge 1 Mobility  ? ?Pt requesting to get into chair. Uses RW to transfer to w/c at baseline, per pt. Pt with baseline L hamstring injury, limiting mobility. Required significantly raised bed, and minA for steadying during transfer. Pt left in chair with all needs met.  ? ?Nelta Numbers ?Acute Rehab ?Phone: [phone] ?Office Phone: [phone] ? ?

## 2021-05-17 NOTE — Progress Notes (Signed)
Heart Failure Stewardship Pharmacist Progress Note ? ? ?PCP: Ngetich, [name] C, NP ?PCP-Cardiologist: None  ? ? ?HPI:  ?71 yo M with PMH of obesity, CKD, chronic respiratory failure, HTN, OSA, and T2DM. He presented to the ED on 3/24 with shortness of breath, 20 lb weight gain over 3 months, and LE edema. CXR with mild cardiomegaly and pleural effusions (L>R). An ECHO was done 3/26 and LVEF 60-65%. Thoracentesis (R) removed 1.2L. ? ?Current HF Medications: ?Diuretic: furosemide 60 mg IV BID ?SGLT2i: Jardiance 10 mg daily ? ?Prior to admission HF Medications: ?Diuretic: bumetanide 1 mg BID; metolazone 2.5 mg twice per week ?ACE/ARB/ARNI: valsartan 40 mg qhs ?SGLT2i: Jardiance 25 mg daily ?Other: Imdur 60 mg daily ? ?Pertinent Lab Values: ?Serum creatinine 1.96, BUN 29, Potassium 4.2, Sodium 138, BNP 218.8, A1c 5.9  ? ?Vital Signs: ?Weight: 377 lbs (admission weight: 401 lbs) ?Blood pressure: 110/60s  ?Heart rate: 70s  ?I/O: -5.1L yesterday; net -10.7L ? ?Medication Assistance / Insurance Benefits Check: ?Does the patient have prescription insurance?  Yes ?Type of insurance plan: [employer] Medicare  ? ?Outpatient Pharmacy:  ?[facility] ?Is the patient willing to use [place] pharmacy at discharge? Yes ?Is the patient willing to transition their outpatient pharmacy to utilize a [facility] outpatient pharmacy?   Pending ?  ? ?Assessment: ?1. Acute on chronic diastolic CHF (EF 33-54%). NYHA class III symptoms. ?- Continue furosemide 60 mg IV BID - weight down and good urine output  ?- Holding PTA valsartan with AKI ?- Caution adding spironolactone with AKI and elevated K up to 5.4 on admission ?- Continue Jardiance 10 mg daily ?- On Actos PTA - consider stopping on discharge ?  ?Plan: ?1) Medication changes recommended at this time: ?- Continue current regimen ? ?2) Patient assistance: ?- Entresto copay $40 ?- Jardiance copay $40 ? ?3)  Education  ?- To be completed prior to  discharge ? ?Kerby Nora, PharmD, BCPS ?Heart Failure Stewardship Pharmacist ?Phone [phone] ? ? ?

## 2021-05-17 NOTE — Progress Notes (Signed)
?PROGRESS NOTE ? ? ? ?Jesse [name]  LKG:[id number] DOB: [birth year] DOA: 05/12/2021 ?PCP: Ngetich, Nelda Bucks, NP  ?Narrative70/M morbidly obese, bed/wheelchair bound with CKD 3 b, hypertension, type 2 diabetes mellitus, sp colectomy/ colostomy, ambulatory dysfunction and chronic hypoxemic respiratory failure on 2 L home O2 who presented with dyspnea and lower extremity edema, x 2-3 weeks. weight gain 20 lbs over last 3 months.  ?-CT Chest with large left pleural effusion and left lower lobe atelectasis.  ?-03/26 thoracentesis on the right 1.2 L removed.  ?  ?Subjective: Feels better, overall improving, had some tightness discomfort across his chest ? ? ?Assessment & Plan: ? ?Acute CHF (congestive heart failure) ([place]) ?Pleural effusion ?Echo  with LV EF 60 to 65%, with mild left ventricular hypertrophy, preserved RV systolic function. ?-Diuresed with IV Lasix he is 9.5 L negative, weight down 24 lbs ?-Creatinine improving with diuresis as well ?-Repeat chest x-ray, concern for recurrent effusion ?-Underwent thoracentesis on 3/26 with 1.2 L drained ?-Continue current dose of IV Lasix, empagliflozin ?-Further GDMT limited by CKD ? ?Acute kidney injury superimposed on chronic kidney disease ([place]) ?CKD stage 3b  ?-Creatinine 2.6 on admission, improving with diuresis, down to 1.9 today ?-Baseline creatinine around 1.8 ? ?Chronic respiratory failure with hypoxia (HCC) ?Oxymetry is 92% on 2 L.min per [place], at home patient is on chronic supplemental 02 per [place] 3 L/min. ? ?Type 2 diabetes mellitus with hyperlipidemia ([place]) ?-CBG stable, continue empagliflozin, sliding scale insulin ? ?OSA (obstructive sleep apnea) ?Continue with Cpap.  ? ?Chronic anemia ?Leukopenia, anemia of iron deficiency combined with anemia of chronic disease.   ?Iron panel with serum iron 57, TIBC 361, transferrin saturation 16 and ferritin 258. ?Continue with oral iron supplementation.  ?-CBC in a.m. ? ?Obesity, Class III, BMI 40-49.9 (morbid obesity)  ([place]) ?Calculated BMI is 54,33. ?He has a large ventral hernia on the left side and colostomy bag on the right side of his abdomen.  ? ?General debility ?-He is bed and wheelchair bound ? ? ?DVT prophylaxis:Hep SQ ?Code Status: Full Code ?Family Communication: d/w pt, no family at bedside ?Disposition Plan: home in 2-3days ? ?Procedures:  ? ?Antimicrobials:  ? ? ?Objective: ?Vitals:  ? 05/17/21 0831 05/17/21 0840 05/17/21 0926 05/17/21 1057  ?BP: (!) 116/50  (!) 105/55 118/61  ?Pulse: 68  64 60  ?Resp: 18  18 17   ?Temp:   98.7 ?F (37.1 ?C) 98.1 ?F (36.7 ?C)  ?TempSrc:   Oral Oral  ?SpO2: 97% 98% 92% 97%  ?Weight:      ?Height:      ? ? ?Intake/Output Summary (Last 24 hours) at 05/17/2021 1346 ?Last data filed at 05/17/2021 1256 ?Gross per 24 hour  ?Intake 1077 ml  ?Output 4621 ml  ?Net -3544 ml  ? ?Filed Weights  ? 05/15/21 0336 05/16/21 0436 05/17/21 0422  ?Weight: (!) 177.9 kg (!) 174.5 kg (!) 171.4 kg  ? ? ?Examination: ? ?General exam: Obese, chronically ill male, laying in bed, AAOx3 no distress ?Respiratory system: decreased BS at bases ?Cardiovascular system: S1 & S2 heard, RRR.  ?Abd: nondistended, soft and nontender.Normal bowel sounds heard. ?Central nervous system: Alert and oriented. No focal neurological deficits. ?Extremities: 1+ edema, R>L ?Skin: No rashes ?Psychiatry: Judgement and insight appear normal. Mood & affect appropriate.  ? ? ? ?Data Reviewed:  ? ?CBC: ?Recent Labs  ?Lab 05/12/21 ?1830 05/14/21 ?0158  ?WBC 3.7* 3.3*  ?HGB 9.6* 8.9*  ?HCT 32.5* 29.5*  ?MCV 98.2 99.3  ?PLT  157 132*  ? ?Basic Metabolic Panel: ?Recent Labs  ?Lab 05/12/21 ?1830 05/14/21 ?0158 05/15/21 ?6222 05/16/21 ?9798 05/17/21 ?0356  ?NA 136 139 137 138 138  ?K 5.4* 4.9 4.7 4.5 4.2  ?CL 104 108 105 103 104  ?CO2 25 28 27 28 27   ?GLUCOSE 128* 130* 118* 130* 107*  ?BUN 40* 28* 28* 29* 29*  ?CREATININE 2.61* 2.07* 2.09* 2.15* 1.96*  ?CALCIUM 8.7* 8.3* 8.3* 8.5* 8.2*  ?MG  --   --   --   --  2.2  ? ?GFR: ?Estimated Creatinine  Clearance: 56.3 mL/min (A) (by C-G formula based on SCr of 1.96 mg/dL (H)). ?Liver Function Tests: ?Recent Labs  ?Lab 05/12/21 ?2050  ?AST 13*  ?ALT 7  ?ALKPHOS 49  ?BILITOT 0.5  ?PROT 7.0  ?ALBUMIN 3.8  ? ?No results for input(s): LIPASE, AMYLASE in the last 168 hours. ?No results for input(s): AMMONIA in the last 168 hours. ?Coagulation Profile: ?No results for input(s): INR, PROTIME in the last 168 hours. ?Cardiac Enzymes: ?No results for input(s): CKTOTAL, CKMB, CKMBINDEX, TROPONINI in the last 168 hours. ?BNP (last 3 results) ?No results for input(s): PROBNP in the last 8760 hours. ?HbA1C: ?No results for input(s): HGBA1C in the last 72 hours. ?CBG: ?Recent Labs  ?Lab 05/16/21 ?1105 05/16/21 ?1643 05/16/21 ?2110 05/17/21 ?9211 05/17/21 ?1055  ?GLUCAP 127* 147* 166* 103* 123*  ? ?Lipid Profile: ?No results for input(s): CHOL, HDL, LDLCALC, TRIG, CHOLHDL, LDLDIRECT in the last 72 hours. ?Thyroid Function Tests: ?No results for input(s): TSH, T4TOTAL, FREET4, T3FREE, THYROIDAB in the last 72 hours. ?Anemia Panel: ?Recent Labs  ?  05/15/21 ?0637  ?FERRITIN 38  ?TIBC 361  ?IRON 57  ? ?Urine analysis: ?   ?Component Value Date/Time  ? COLORURINE YELLOW 04/08/2008 1200  ? APPEARANCEUR CLEAR 04/08/2008 1200  ? LABSPEC 1.019 04/08/2008 1200  ? PHURINE 7.5 04/08/2008 1200  ? GLUCOSEU 250 (A) 04/08/2008 1200  ? HGBUR NEGATIVE 04/08/2008 1200  ? [place] NEGATIVE 04/08/2008 1200  ? KETONESUR TRACE (A) 04/08/2008 1200  ? PROTEINUR 30 (A) 04/08/2008 1200  ? UROBILINOGEN 1.0 04/08/2008 1200  ? NITRITE NEGATIVE 04/08/2008 1200  ? LEUKOCYTESUR NEGATIVE 04/08/2008 1200  ? ?Sepsis Labs: ?@[email or web address] (procalcitonin:4,lacticidven:4) ? ?) ?Recent Results (from the past 240 hour(s))  ?Resp Panel by RT-PCR (Flu A&B, Covid) Nasopharyngeal Swab     Status: None  ? Collection Time: 05/12/21  8:51 PM  ? Specimen: Nasopharyngeal Swab; Nasopharyngeal(NP) swabs in vial transport medium  ?Result Value Ref Range Status  ? SARS Coronavirus 2  by RT PCR NEGATIVE NEGATIVE Final  ?  Comment: (NOTE) ?SARS-CoV-2 target nucleic acids are NOT DETECTED. ? ?The SARS-CoV-2 RNA is generally detectable in upper respiratory ?specimens during the acute phase of infection. The lowest ?concentration of SARS-CoV-2 viral copies this assay can detect is ?138 copies/mL. A negative result does not preclude SARS-Cov-2 ?infection and should not be used as the sole basis for treatment or ?other patient management decisions. A negative result may occur with  ?improper specimen collection/handling, submission of specimen other ?than nasopharyngeal swab, presence of viral mutation(s) within the ?areas targeted by this assay, and inadequate number of viral ?copies(<138 copies/mL). A negative result must be combined with ?clinical observations, patient history, and epidemiological ?information. The expected result is Negative. ? ?Fact Sheet for Patients:  ?EntrepreneurPulse.com.au ? ?Fact Sheet for Healthcare Providers:  ?IncredibleEmployment.be ? ?This test is no t yet approved or cleared by the [place] and  ?has been authorized for  detection and/or diagnosis of SARS-CoV-2 by ?FDA under an Emergency Use Authorization (EUA). This EUA will remain  ?in effect (meaning this test can be used) for the duration of the ?COVID-19 declaration under Section 564(b)(1) of the Act, 21 ?U.S.C.section 360bbb-3(b)(1), unless the authorization is terminated  ?or revoked sooner.  ? ? ?  ? Influenza A by PCR NEGATIVE NEGATIVE Final  ? Influenza B by PCR NEGATIVE NEGATIVE Final  ?  Comment: (NOTE) ?The Xpert Xpress SARS-CoV-2/FLU/RSV plus assay is intended as an aid ?in the diagnosis of influenza from Nasopharyngeal swab specimens and ?should not be used as a sole basis for treatment. Nasal washings and ?aspirates are unacceptable for Xpert Xpress SARS-CoV-2/FLU/RSV ?testing. ? ?Fact Sheet for Patients: ?EntrepreneurPulse.com.au ? ?Fact  Sheet for Healthcare Providers: ?IncredibleEmployment.be ? ?This test is not yet approved or cleared by the [place] FDA and ?has been authorized for detection and/or diagnosis of SARS-CoV-2

## 2021-05-18 ENCOUNTER — Inpatient Hospital Stay (HOSPITAL_COMMUNITY): Payer: Medicare PPO

## 2021-05-18 DIAGNOSIS — N189 Chronic kidney disease, unspecified: Secondary | ICD-10-CM | POA: Diagnosis not present

## 2021-05-18 DIAGNOSIS — J9611 Chronic respiratory failure with hypoxia: Secondary | ICD-10-CM | POA: Diagnosis not present

## 2021-05-18 DIAGNOSIS — N179 Acute kidney failure, unspecified: Secondary | ICD-10-CM | POA: Diagnosis not present

## 2021-05-18 DIAGNOSIS — I5031 Acute diastolic (congestive) heart failure: Secondary | ICD-10-CM | POA: Diagnosis not present

## 2021-05-18 HISTORY — PX: IR THORACENTESIS ASP PLEURAL SPACE W/IMG GUIDE: IMG5380

## 2021-05-18 LAB — BASIC METABOLIC PANEL
Anion gap: 8 (ref 5–15)
BUN: 29 mg/dL — ABNORMAL HIGH (ref 8–23)
CO2: 30 mmol/L (ref 22–32)
Calcium: 8.3 mg/dL — ABNORMAL LOW (ref 8.9–10.3)
Chloride: 102 mmol/L (ref 98–111)
Creatinine, Ser: 2.08 mg/dL — ABNORMAL HIGH (ref 0.61–1.24)
GFR, Estimated: 33 mL/min — ABNORMAL LOW (ref >60)
Glucose, Bld: 108 mg/dL — ABNORMAL HIGH (ref 70–99)
Potassium: 4.5 mmol/L (ref 3.5–5.1)
Sodium: 140 mmol/L (ref 135–145)

## 2021-05-18 LAB — GLUCOSE, CAPILLARY
Glucose-Capillary: 107 mg/dL — ABNORMAL HIGH (ref 70–99)
Glucose-Capillary: 165 mg/dL — ABNORMAL HIGH (ref 70–99)
Glucose-Capillary: 146 mg/dL — ABNORMAL HIGH (ref 70–99)
Glucose-Capillary: 161 mg/dL — ABNORMAL HIGH (ref 70–99)

## 2021-05-18 LAB — BODY FLUID CELL COUNT WITH DIFFERENTIAL
Eos, Fluid: 17 %
Lymphs, Fluid: 55 %
Monocyte-Macrophage-Serous Fluid: 20 % — ABNORMAL LOW (ref 50–90)
Neutrophil Count, Fluid: 5 % (ref 0–25)
Other Cells, Fluid: 3 %
Total Nucleated Cell Count, Fluid: 77 cu mm (ref 0–1000)

## 2021-05-18 LAB — CULTURE, BLOOD (ROUTINE X 2)
Culture: NO GROWTH
Culture: NO GROWTH
Special Requests: ADEQUATE
Special Requests: ADEQUATE

## 2021-05-18 LAB — CBC
HCT: 29 % — ABNORMAL LOW (ref 39.0–52.0)
Hemoglobin: 9.1 g/dL — ABNORMAL LOW (ref 13.0–17.0)
MCH: 30.4 pg (ref 26.0–34.0)
MCHC: 31.4 g/dL (ref 30.0–36.0)
MCV: 97 fL (ref 80.0–100.0)
Platelets: 133 10*3/uL — ABNORMAL LOW (ref 150–400)
RBC: 2.99 MIL/uL — ABNORMAL LOW (ref 4.22–5.81)
RDW: 16.2 % — ABNORMAL HIGH (ref 11.5–15.5)
WBC: 4 10*3/uL (ref 4.0–10.5)
nRBC: 0 % (ref 0.0–0.2)

## 2021-05-18 LAB — ALBUMIN, PLEURAL OR PERITONEAL FLUID: Albumin, Fluid: 2.3 g/dL

## 2021-05-18 MED ORDER — FUROSEMIDE 10 MG/ML IJ SOLN
40.0000 mg | Freq: Every day | INTRAMUSCULAR | Status: DC
Start: 2021-05-18 — End: 2021-05-19
  Administered 2021-05-18: 40 mg via INTRAVENOUS
  Filled 2021-05-18: qty 4

## 2021-05-18 MED ORDER — HYDROCORTISONE 1 % EX CREA
1.0000 "application " | TOPICAL_CREAM | Freq: Three times a day (TID) | CUTANEOUS | Status: DC | PRN
  Administered 2021-05-18: 1 via TOPICAL
  Filled 2021-05-18: qty 28

## 2021-05-18 MED ORDER — LIDOCAINE HCL 1 % IJ SOLN
INTRAMUSCULAR | Status: AC
  Filled 2021-05-18: qty 20

## 2021-05-18 MED ORDER — GABAPENTIN 100 MG PO CAPS
100.0000 mg | ORAL_CAPSULE | Freq: Two times a day (BID) | ORAL | Status: DC
  Administered 2021-05-18 – 2021-05-21 (×7): 100 mg via ORAL
  Filled 2021-05-18 (×7): qty 1

## 2021-05-18 NOTE — Progress Notes (Signed)
Mobility Specialist Progress Note: ? ? 05/18/21 1050  ?Mobility  ?Activity Stood at bedside;Dangled on edge of bed  ?Level of Assistance Moderate assist, patient does 50-74%  ?Assistive Device Front wheel walker  ?Activity Response Tolerated poorly  ?$Mobility charge 1 Mobility  ? ?Pt eager for mobility. Required modA to stand with significant elevated bed. Pt unable to WB through LLE d/t knee buckling. Deferred further OOB mobility. Pt sat on EOB and performed BLE exercises, with pain only on LLE. Repositioned in bed with all needs met.  ? ?Nelta Numbers ?Acute Rehab ?Phone: [phone] ?Office Phone: [phone] ? ?

## 2021-05-18 NOTE — Progress Notes (Addendum)
Heart Failure Stewardship Pharmacist Progress Note ? ? ?PCP: Ngetich, [name] C, NP ?PCP-Cardiologist: None  ? ? ?HPI:  ?71 yo M with PMH of obesity, CKD, chronic respiratory failure, HTN, OSA, and T2DM. He presented to the ED on 3/24 with shortness of breath, 20 lb weight gain over 3 months, and LE edema. CXR with mild cardiomegaly and pleural effusions (L>R). An ECHO was done 3/26 and LVEF 60-65%. Thoracentesis (R) removed 1.2L. ? ?Current HF Medications: ?SGLT2i: Jardiance 10 mg daily ? ?Prior to admission HF Medications: ?Diuretic: bumetanide 1 mg BID; metolazone 2.5 mg twice per week ?ACE/ARB/ARNI: valsartan 40 mg qhs ?SGLT2i: Jardiance 25 mg daily ?Other: Imdur 60 mg daily ? ?Pertinent Lab Values: ?Serum creatinine 2.08, BUN 29, Potassium 4.5, Sodium 140, BNP 218.8, A1c 5.9  ? ?Vital Signs: ?Weight: 376 lbs (admission weight: 401 lbs) ?Blood pressure: 110/50s  ?Heart rate: 70s  ?I/O: -2.7L yesterday; net -11.9L ? ?Medication Assistance / Insurance Benefits Check: ?Does the patient have prescription insurance?  Yes ?Type of insurance plan: [employer]  ? ?Outpatient Pharmacy:  ?Prior to admission outpatient pharmacy: [facility] ?Is the patient willing to use [place] pharmacy at discharge? Yes ?Is the patient willing to transition their outpatient pharmacy to utilize a [facility] outpatient pharmacy?   Pending ?  ? ?Assessment: ?1. Acute on chronic diastolic CHF (EF 26-20%). NYHA class III symptoms. ?- Now off IV lasix - weight down and good urine output. Noted in MD note today about dropping to lasix 40 mg - no current orders and last dose given 3/29. Will message to clarify. ?- Holding PTA valsartan with AKI ?- Caution adding spironolactone with AKI and elevated K up to 5.4 on admission ?- Continue Jardiance 10 mg daily ?- On Actos PTA - consider stopping on discharge ?  ?Plan: ?1) Medication changes recommended at this time: ?- Clarify dosing of lasix ? ?2) Patient assistance: ?- Entresto copay $40 ?-  Jardiance copay $40 ? ?3)  Education  ?- To be completed prior to discharge ? ?Kerby Nora, PharmD, BCPS ?Heart Failure Stewardship Pharmacist ?Phone [phone] ? ? ?

## 2021-05-18 NOTE — Care Management Important Message (Signed)
Important Message ? ?Patient Details  ?Name: Jesse [name] ?MRN: [id number] ?Date of Birth: 29-Oct-1950 ? ? ?Medicare Important Message Given:  Yes ? ? ? ? ?Jesse [name] ?05/18/2021, 10:15 AM ?

## 2021-05-18 NOTE — Progress Notes (Signed)
Error entry ?Wrong name entered for legal guardianship. Wrong chart. It is not Garnette Scheuermann. ?

## 2021-05-18 NOTE — Progress Notes (Addendum)
?PROGRESS NOTE ? ? ? ?Jesse [name]  PYK:[id number] DOB: 17-Oct-1950 DOA: 05/12/2021 ?PCP: Ngetich, Nelda Bucks, NP  ?Narrative70/M morbidly obese, bed/wheelchair bound with CKD 3 b, hypertension, type 2 diabetes mellitus, sp colectomy/ colostomy, ambulatory dysfunction and chronic hypoxemic respiratory failure on 2 L home O2 who presented with dyspnea and lower extremity edema, x 2-3 weeks. weight gain 20 lbs over last 3 months.  ?-CT Chest with large left pleural effusion and left lower lobe atelectasis.  ?-03/26 thoracentesis on the right 1.2 L removed.  ?-Improving with diuresis ?  ?Subjective: Feels better overall, some sciatica pain in his legs, some discomfort across his chest ? ? ?Assessment & Plan: ? ?Acute CHF (congestive heart failure) ([place]) ?Pleural effusion ?Echo  with LV EF 60 to 65%, with mild left ventricular hypertrophy, preserved RV systolic function. ?-Diuresed with IV Lasix he is 11.6 L negative, weight down 25 lbs ?-Creatinine appears to be plateauing ?-Repeat chest x-ray with moderate left effusion-will request repeat thoracentesis ?-Underwent thoracentesis on 3/26 with 1.2 L drained ?-Cut down Lasix to 40 Mg daily today, continue empagliflozin ?-Further GDMT limited by CKD4 ? ?Acute kidney injury superimposed on chronic kidney disease ([place]) ?CKD stage 3b  ?-Creatinine 2.6 on admission, improving with diuresis, now appears to be plateauing, 2.0 today ?-Baseline creatinine around 1.8 ? ?Chronic respiratory failure with hypoxia (HCC) ?Oxymetry is 92% on 2 L.min per [place], at home patient is on chronic supplemental 02 per [place] 3 L/min. ? ?Type 2 diabetes mellitus with hyperlipidemia ([place]) ?-CBG stable, continue empagliflozin, sliding scale insulin ? ?Neuropathy, sciatica pain ?-Add low-dose gabapentin ? ?OSA (obstructive sleep apnea) ?Continue with Cpap.  ? ?Chronic anemia ?Leukopenia, anemia of iron deficiency combined with anemia of chronic disease.   ?Iron panel with serum iron 57, TIBC 361, transferrin  saturation 16 and ferritin 258. ?Continue with oral iron supplementation.  ?-CBC in a.m. ? ?Obesity, Class III, BMI 40-49.9 (morbid obesity) ([place]) ?Calculated BMI is 54,33. ?He has a large ventral hernia on the left side and colostomy bag on the right side of his abdomen.  ? ?General debility ?-He is bed and wheelchair bound ?-PT OT eval ? ? ?DVT prophylaxis:Hep SQ ?Code Status: Full Code ?Family Communication: d/w pt, no family at bedside ?Disposition Plan: home likely 48 hours ? ?Procedures:  ? ?Antimicrobials:  ? ? ?Objective: ?Vitals:  ? 05/18/21 0336 05/18/21 0730 05/18/21 0737 05/18/21 1109  ?BP: (!) 108/51  (!) 114/51 (!) 114/57  ?Pulse: 60  61 63  ?Resp: 16   18  ?Temp: (!) 97.3 ?F (36.3 ?C)  98.2 ?F (36.8 ?C) (!) 97.5 ?F (36.4 ?C)  ?TempSrc: Oral  Oral Oral  ?SpO2: (!) 85% 98% 98% 98%  ?Weight: (!) 170.7 kg     ?Height:      ? ? ?Intake/Output Summary (Last 24 hours) at 05/18/2021 1144 ?Last data filed at 05/18/2021 0700 ?Gross per 24 hour  ?Intake 660 ml  ?Output 1871 ml  ?Net -1211 ml  ? ?Filed Weights  ? 05/16/21 0436 05/17/21 0422 05/18/21 0336  ?Weight: (!) 174.5 kg (!) 171.4 kg (!) 170.7 kg  ? ? ?Examination: ? ?General exam: Obese chronically ill male laying in bed, AAOx3, no distress ?HEENT: No JVD ?CVS: S1-S2, regular rate rhythm ?Lungs: Decreased breath sounds on the left ?Abdomen: Soft, obese, nontender, bowel sounds present, minimal lateral abdominal wall edema ?Extremities: Trace edema ,  R>L ?Skin: No rashes ?Psychiatry: Judgement and insight appear normal. Mood & affect appropriate.  ? ? ? ?  Data Reviewed:  ? ?CBC: ?Recent Labs  ?Lab 05/12/21 ?1830 05/14/21 ?0158 05/18/21 ?0552  ?WBC 3.7* 3.3* 4.0  ?HGB 9.6* 8.9* 9.1*  ?HCT 32.5* 29.5* 29.0*  ?MCV 98.2 99.3 97.0  ?PLT 157 132* 133*  ? ?Basic Metabolic Panel: ?Recent Labs  ?Lab 05/14/21 ?0158 05/15/21 ?6578 05/16/21 ?4696 05/17/21 ?0356 05/18/21 ?0552  ?NA 139 137 138 138 140  ?K 4.9 4.7 4.5 4.2 4.5  ?CL 108 105 103 104 102  ?CO2 28 27 28 27 30    ?GLUCOSE 130* 118* 130* 107* 108*  ?BUN 28* 28* 29* 29* 29*  ?CREATININE 2.07* 2.09* 2.15* 1.96* 2.08*  ?CALCIUM 8.3* 8.3* 8.5* 8.2* 8.3*  ?MG  --   --   --  2.2  --   ? ?GFR: ?Estimated Creatinine Clearance: 52.9 mL/min (A) (by C-G formula based on SCr of 2.08 mg/dL (H)). ?Liver Function Tests: ?Recent Labs  ?Lab 05/12/21 ?2050  ?AST 13*  ?ALT 7  ?ALKPHOS 49  ?BILITOT 0.5  ?PROT 7.0  ?ALBUMIN 3.8  ? ?No results for input(s): LIPASE, AMYLASE in the last 168 hours. ?No results for input(s): AMMONIA in the last 168 hours. ?Coagulation Profile: ?No results for input(s): INR, PROTIME in the last 168 hours. ?Cardiac Enzymes: ?No results for input(s): CKTOTAL, CKMB, CKMBINDEX, TROPONINI in the last 168 hours. ?BNP (last 3 results) ?No results for input(s): PROBNP in the last 8760 hours. ?HbA1C: ?No results for input(s): HGBA1C in the last 72 hours. ?CBG: ?Recent Labs  ?Lab 05/17/21 ?1055 05/17/21 ?1609 05/17/21 ?2012 05/18/21 ?2952 05/18/21 ?1106  ?GLUCAP 123* 139* 142* 107* 165*  ? ?Lipid Profile: ?No results for input(s): CHOL, HDL, LDLCALC, TRIG, CHOLHDL, LDLDIRECT in the last 72 hours. ?Thyroid Function Tests: ?No results for input(s): TSH, T4TOTAL, FREET4, T3FREE, THYROIDAB in the last 72 hours. ?Anemia Panel: ?No results for input(s): VITAMINB12, FOLATE, FERRITIN, TIBC, IRON, RETICCTPCT in the last 72 hours. ? ?Urine analysis: ?   ?Component Value Date/Time  ? COLORURINE YELLOW 04/08/2008 1200  ? APPEARANCEUR CLEAR 04/08/2008 1200  ? LABSPEC 1.019 04/08/2008 1200  ? PHURINE 7.5 04/08/2008 1200  ? GLUCOSEU 250 (A) 04/08/2008 1200  ? HGBUR NEGATIVE 04/08/2008 1200  ? [place] NEGATIVE 04/08/2008 1200  ? KETONESUR TRACE (A) 04/08/2008 1200  ? PROTEINUR 30 (A) 04/08/2008 1200  ? UROBILINOGEN 1.0 04/08/2008 1200  ? NITRITE NEGATIVE 04/08/2008 1200  ? LEUKOCYTESUR NEGATIVE 04/08/2008 1200  ? ?Sepsis Labs: ?@[email or web address] (procalcitonin:4,lacticidven:4) ? ?) ?Recent Results (from the past 240 hour(s))  ?Resp Panel by RT-PCR  (Flu A&B, Covid) Nasopharyngeal Swab     Status: None  ? Collection Time: 05/12/21  8:51 PM  ? Specimen: Nasopharyngeal Swab; Nasopharyngeal(NP) swabs in vial transport medium  ?Result Value Ref Range Status  ? SARS Coronavirus 2 by RT PCR NEGATIVE NEGATIVE Final  ?  Comment: (NOTE) ?SARS-CoV-2 target nucleic acids are NOT DETECTED. ? ?The SARS-CoV-2 RNA is generally detectable in upper respiratory ?specimens during the acute phase of infection. The lowest ?concentration of SARS-CoV-2 viral copies this assay can detect is ?138 copies/mL. A negative result does not preclude SARS-Cov-2 ?infection and should not be used as the sole basis for treatment or ?other patient management decisions. A negative result may occur with  ?improper specimen collection/handling, submission of specimen other ?than nasopharyngeal swab, presence of viral mutation(s) within the ?areas targeted by this assay, and inadequate number of viral ?copies(<138 copies/mL). A negative result must be combined with ?clinical observations, patient history, and epidemiological ?information. The expected result is Negative. ? ?  Fact Sheet for Patients:  ?EntrepreneurPulse.com.au ? ?Fact Sheet for Healthcare Providers:  ?IncredibleEmployment.be ? ?This test is no t yet approved or cleared by the [place] FDA and  ?has been authorized for detection and/or diagnosis of SARS-CoV-2 by ?FDA under an Emergency Use Authorization (EUA). This EUA will remain  ?in effect (meaning this test can be used) for the duration of the ?COVID-19 declaration under Section 564(b)(1) of the Act, 21 ?U.S.C.section 360bbb-3(b)(1), unless the authorization is terminated  ?or revoked sooner.  ? ? ?  ? Influenza A by PCR NEGATIVE NEGATIVE Final  ? Influenza B by PCR NEGATIVE NEGATIVE Final  ?  Comment: (NOTE) ?The Xpert Xpress SARS-CoV-2/FLU/RSV plus assay is intended as an aid ?in the diagnosis of influenza from Nasopharyngeal swab specimens  and ?should not be used as a sole basis for treatment. Nasal washings and ?aspirates are unacceptable for Xpert Xpress SARS-CoV-2/FLU/RSV ?testing. ? ?Fact Sheet for Patients: ?GlobalCosts.fr

## 2021-05-18 NOTE — Procedures (Signed)
PROCEDURE SUMMARY: ? ?Successful image-guided left thoracentesis. ?Yielded 2L of hazy amber fluid. ?Pt tolerated procedure well. ?No immediate complications. ?EBL = trace  ? ?Specimen was sent for labs. ?CXR ordered. ? ?Please see imaging section of Epic for full dictation. ? ?Tera Mater PA-C ?05/18/2021 ?12:08 PM ? ? ? ?

## 2021-05-18 NOTE — Progress Notes (Signed)
Physical Therapy Treatment ?Patient Details ?Name: Jesse [name] ?MRN: [id number] ?DOB: 1950/11/19 ?Today's Date: 05/18/2021 ? ? ?History of Present Illness The pt is a 71 yo male presenting 3/24 with SOB, 12 kg wt increase, and bilateral LE swelling. Pt found to have L pleural effusion, elevated BNP. PMH includes: morbid obesity, CKD III, chronic hypoxic resp failure on 2L O2, HTN, colectomy and colostomy, and OSA. ? ?  ?PT Comments  ? ? The pt was agreeable to session, and reports LE stretches and PROM from last session relieved his pain for a few hours. The pt continues to be able to demo bed mobility without assist, but required modA of 2 to attempt stand due to LLE pain and weakness. He was unable to progress to gait or stepping at this time, but agreeable to continued LE stretching to address ROM limitations in L calf and HS. Will continue to benefit from skilled PT acutely and following d/c to maximize functional strength and independence.  ?  ?Recommendations for follow up therapy are one component of a multi-disciplinary discharge planning process, led by the attending physician.  Recommendations may be updated based on patient status, additional functional criteria and insurance authorization. ? ?Follow Up Recommendations ? Home health PT (at ALF) ?  ?  ?Assistance Recommended at Discharge Intermittent Supervision/Assistance  ?Patient can return home with the following A little help with walking and/or transfers;A little help with bathing/dressing/bathroom;Assistance with cooking/housework;Direct supervision/assist for medications management;Assist for transportation;Help with stairs or ramp for entrance ?  ?Equipment Recommendations ? None recommended by PT  ?  ?Recommendations for Other Services   ? ? ?  ?Precautions / Restrictions Precautions ?Precautions: Fall ?Restrictions ?Weight Bearing Restrictions: No  ?  ? ?Mobility ? Bed Mobility ?Overal bed mobility: Needs Assistance ?Bed Mobility: Supine to  Sit, Sit to Supine ?  ?  ?Supine to sit: Modified independent (Device/Increase time) ?Sit to supine: Min assist ?  ?General bed mobility comments: pt completed with use of bed rails and HOB elevated to come to sitting EOB, then needed assist with LE to return to bed. ?  ? ?Transfers ?Overall transfer level: Needs assistance ?Equipment used: Rolling walker (2 wheels) ?Transfers: Sit to/from Stand ?Sit to Stand: Mod assist, +2 physical assistance, From elevated surface ?  ?  ?  ?  ?  ?General transfer comment: modA to power up to standing with poor clearance of hips due to LLE pain and weakness ?  ? ?Ambulation/Gait ?  ?  ?  ?  ?  ?  ?  ?General Gait Details: pt non ambulatory at baseline ? ? ? ?  ?Balance Overall balance assessment: Mild deficits observed, not formally tested ?  ?  ?  ?  ?  ?  ?  ?  ?  ?  ?  ?  ?  ?  ?  ?  ?  ?  ?  ? ?  ?Cognition Arousal/Alertness: Awake/alert ?Behavior During Therapy: [facility] for tasks assessed/performed ?Overall Cognitive Status: Within Functional Limits for tasks assessed ?  ?  ?  ?  ?  ?  ?  ?  ?  ?  ?  ?  ?  ?  ?  ?  ?  ?  ?  ? ?  ?Exercises Other Exercises ?Other Exercises: 90-90 HS stretch and PROM to LLE x 10 for 15 sec. addition of ankle DF/PF ?Other Exercises: PROM ankle DF in supine, causes pain behind pt's knee ? ?  ?General Comments   ?  ?  ? ?  Pertinent Vitals/Pain Pain Assessment ?Pain Assessment: Faces ?Faces Pain Scale: Hurts even more ?Pain Location: L hamstring. ?Pain Descriptors / Indicators: Burning, Grimacing ?Pain Intervention(s): Limited activity within patient's tolerance, Monitored during session, Repositioned  ? ? ? ?PT Goals (current goals can now be found in the care plan section) Acute Rehab PT Goals ?Patient Stated Goal: reduce pain in left hamstring ?PT Goal Formulation: With patient ?Time For Goal Achievement: 05/29/21 ?Potential to Achieve Goals: Good ?Progress towards PT goals: Progressing toward goals ? ?  ?Frequency ? ? ? Min 2X/week ? ? ? ?  ?PT Plan  Current plan remains appropriate  ? ? ?   ?AM-PAC PT "6 Clicks" Mobility   ?Outcome Measure ? Help needed turning from your back to your side while in a flat bed without using bedrails?: None ?Help needed moving from lying on your back to sitting on the side of a flat bed without using bedrails?: A Little ?Help needed moving to and from a bed to a chair (including a wheelchair)?: A Little ?Help needed standing up from a chair using your arms (e.g., wheelchair or bedside chair)?: A Lot ?Help needed to walk in hospital room?: Total ?Help needed climbing 3-5 steps with a railing? : Total ?6 Click Score: 14 ? ?  ?End of Session Equipment Utilized During Treatment: Oxygen ?Activity Tolerance: Patient tolerated treatment well ?Patient left: in bed;with call bell/phone within reach ?Nurse Communication: Mobility status ?PT Visit Diagnosis: Muscle weakness (generalized) (M62.81);Pain ?Pain - Right/Left: Left ?Pain - part of body: Leg ?  ? ? ?Time: 2263-3354 ?PT Time Calculation (min) (ACUTE ONLY): 32 min ? ?Charges:  $Therapeutic Exercise: 23-37 mins          ?          ? ?West Carbo, PT, DPT  ? ?Acute Rehabilitation Department ?Pager #: [phone] ? ? ?Sandra Cockayne ?05/18/2021, 5:48 PM ? ?

## 2021-05-19 ENCOUNTER — Inpatient Hospital Stay (HOSPITAL_COMMUNITY): Payer: Medicare PPO

## 2021-05-19 DIAGNOSIS — I5031 Acute diastolic (congestive) heart failure: Secondary | ICD-10-CM | POA: Diagnosis not present

## 2021-05-19 DIAGNOSIS — N179 Acute kidney failure, unspecified: Secondary | ICD-10-CM | POA: Diagnosis not present

## 2021-05-19 DIAGNOSIS — N189 Chronic kidney disease, unspecified: Secondary | ICD-10-CM | POA: Diagnosis not present

## 2021-05-19 DIAGNOSIS — J9611 Chronic respiratory failure with hypoxia: Secondary | ICD-10-CM | POA: Diagnosis not present

## 2021-05-19 LAB — CBC
HCT: 30.9 % — ABNORMAL LOW (ref 39.0–52.0)
Hemoglobin: 9.3 g/dL — ABNORMAL LOW (ref 13.0–17.0)
MCH: 29.8 pg (ref 26.0–34.0)
MCHC: 30.1 g/dL (ref 30.0–36.0)
MCV: 99 fL (ref 80.0–100.0)
Platelets: 136 10*3/uL — ABNORMAL LOW (ref 150–400)
RBC: 3.12 MIL/uL — ABNORMAL LOW (ref 4.22–5.81)
RDW: 16.2 % — ABNORMAL HIGH (ref 11.5–15.5)
WBC: 5 10*3/uL (ref 4.0–10.5)
nRBC: 0 % (ref 0.0–0.2)

## 2021-05-19 LAB — BASIC METABOLIC PANEL
Anion gap: 9 (ref 5–15)
BUN: 28 mg/dL — ABNORMAL HIGH (ref 8–23)
CO2: 28 mmol/L (ref 22–32)
Calcium: 8.2 mg/dL — ABNORMAL LOW (ref 8.9–10.3)
Chloride: 100 mmol/L (ref 98–111)
Creatinine, Ser: 1.96 mg/dL — ABNORMAL HIGH (ref 0.61–1.24)
GFR, Estimated: 36 mL/min — ABNORMAL LOW (ref >60)
Glucose, Bld: 99 mg/dL (ref 70–99)
Potassium: 4.4 mmol/L (ref 3.5–5.1)
Sodium: 137 mmol/L (ref 135–145)

## 2021-05-19 LAB — CYTOLOGY - NON PAP

## 2021-05-19 LAB — GLUCOSE, CAPILLARY
Glucose-Capillary: 105 mg/dL — ABNORMAL HIGH (ref 70–99)
Glucose-Capillary: 162 mg/dL — ABNORMAL HIGH (ref 70–99)
Glucose-Capillary: 167 mg/dL — ABNORMAL HIGH (ref 70–99)
Glucose-Capillary: 121 mg/dL — ABNORMAL HIGH (ref 70–99)

## 2021-05-19 MED ORDER — FUROSEMIDE 40 MG PO TABS
40.0000 mg | ORAL_TABLET | Freq: Two times a day (BID) | ORAL | Status: DC
Start: 2021-05-19 — End: 2021-05-20
  Administered 2021-05-19 – 2021-05-20 (×3): 40 mg via ORAL
  Filled 2021-05-19 (×3): qty 1

## 2021-05-19 NOTE — Progress Notes (Signed)
Heart Failure Stewardship Pharmacist Progress Note ? ? ?PCP: Ngetich, [name] C, NP ?PCP-Cardiologist: None  ? ? ?HPI:  ?71 yo M with PMH of obesity, CKD, chronic respiratory failure, HTN, OSA, and T2DM. He presented to the ED on 3/24 with shortness of breath, 20 lb weight gain over 3 months, and LE edema. CXR with mild cardiomegaly and pleural effusions (L>R). An ECHO was done 3/26 and LVEF 60-65%. R thoracentesis removed 1.2L and L thoracentesis removed 2L. ? ?Current HF Medications: ?Diuretic: furosemide 40 mg PO BID ?SGLT2i: Jardiance 10 mg daily ? ?Prior to admission HF Medications: ?Diuretic: bumetanide 1 mg BID; metolazone 2.5 mg twice per week ?ACE/ARB/ARNI: valsartan 40 mg qhs ?SGLT2i: Jardiance 25 mg daily ?Other: Imdur 60 mg daily ? ?Pertinent Lab Values: ?Serum creatinine 1.96, BUN 28, Potassium 4.4, Sodium 137, BNP 218.8, A1c 5.9  ? ?Vital Signs: ?Weight: 372 lbs (admission weight: 401 lbs) ?Blood pressure: 110/50s  ?Heart rate: 50-60s  ?I/O: -2.5L yesterday; net -12.7L ? ?Medication Assistance / Insurance Benefits Check: ?Does the patient have prescription insurance?  Yes ?Type of insurance plan: [employer]  ? ?Outpatient Pharmacy:  ?Prior to admission outpatient pharmacy: [facility] ?Is the patient willing to use [facility] pharmacy at discharge? Yes ?Is the patient willing to transition their outpatient pharmacy to utilize a [facility] outpatient pharmacy?   Pending ?  ? ?Assessment: ?1. Acute on chronic diastolic CHF (EF 47-82%). NYHA class III symptoms. ?- Continue furosemide 40 mg PO BID ?- Holding PTA valsartan with AKI ?- Caution adding spironolactone with AKI and elevated K up to 5.4 on admission ?- Continue Jardiance 10 mg daily ?- On Actos PTA - consider stopping on discharge ?  ?Plan: ?1) Medication changes recommended at this time: ?- Continue current regimen ? ?2) Patient assistance: ?- Entresto copay $40 ?- Jardiance copay $40 ? ?3)  Education  ?- To be completed prior to  discharge ? ?Kerby Nora, PharmD, BCPS ?Heart Failure Stewardship Pharmacist ?Phone [phone] ? ? ?

## 2021-05-19 NOTE — Progress Notes (Signed)
Mobility Specialist Progress Note: ? ? 05/19/21 1005  ?Mobility  ?Activity  ?(Bed-level exercises)  ?Range of Motion/Exercises Active;Right leg;Left leg  ?Activity Response Tolerated well  ?$Mobility charge 1 Mobility  ? ?Pt agreeable to mobility session. Declined OOB mobility at this time d/t "finally getting comfortable". Agreed to do bed level exercises, pt tolerated well. Left sitting up in bed with all needs met.  ? ?Nelta Numbers ?Acute Rehab ?Phone: [phone] ?Office Phone: [phone] ? ?

## 2021-05-19 NOTE — TOC Transition Note (Addendum)
Transition of Care (TOC) - CM/SW Discharge Note ? ? ?Patient Details  ?Name: Jesse [name] ?MRN: [id number] ?Date of Birth: 14-Dec-1950 ? ?Transition of Care (TOC) CM/SW Contact:  ?Zenon Mayo, RN ?Phone Number: ?05/19/2021, 1:20 PM ? ? ?Clinical Narrative:    ?He lives at [facility], he has electric w/chair, tilt bed and lift chair and home oxygen 2 liters with [employer].  He states he will need ambulance transport at discharge, his electric chair is at home. And he was brought here by ambulance.  He has a colostomy and does his own colostomy care.  He uses a cpap at night. He will work with [employer] onsite. NCM notified Lauren with [employer] that plan is for [employer].  ? ? ?Final next level of care: [place] ?Barriers to Discharge: Continued Medical Work up ? ? ?Patient Goals and CMS Choice ?Patient states their goals for this hospitalization and ongoing recovery are:: return to [place] ?  ?Choice offered to / list presented to : NA ? ?Discharge Placement ?  ?           ?  ?  ?  ?  ? ?Discharge Plan and Services ?  ?Discharge Planning Services: CM Consult ?Post Acute Care Choice: Resumption of Svcs/PTA Provider          ?  ?DME Agency: NA ?  ?  ?  ?HH Arranged: PT ?[place] Agency:  Secondary school teacher on site) ?  ?  ?  ? ?Social Determinants of Health (SDOH) Interventions ?Food Insecurity Interventions: Intervention Not Indicated ?Financial Strain Interventions: Intervention Not Indicated ?Housing Interventions: Intervention Not Indicated ?Transportation Interventions:  (would like to speak about transportation with CSW) ? ? ?Readmission Risk Interventions ? ?  05/15/2021  ?  2:21 PM  ?Readmission Risk Prevention Plan  ?Transportation Screening Complete  ?PCP or Specialist Appt within 3-5 Days Complete  ?[place] or Home Care Consult Complete  ?Social Work Consult for [place] Planning/Counseling Complete  ?Palliative Care Screening Not Applicable  ?Medication Review Press photographer)  Complete  ? ? ? ? ? ?

## 2021-05-19 NOTE — Progress Notes (Signed)
?PROGRESS NOTE ? ? ? ?Jesse [name]  HLK:[id number] DOB: 1950-05-30 DOA: 05/12/2021 ?PCP: Ngetich, Nelda Bucks, NP  ?Narrative70/M morbidly obese, bed/wheelchair bound with CKD 3 b, hypertension, type 2 diabetes mellitus, sp colectomy/ colostomy, ambulatory dysfunction and chronic hypoxemic respiratory failure on 2 L home O2 who presented with dyspnea and lower extremity edema, x 2-3 weeks. weight gain 20 lbs over last 3 months.  ?-CT Chest with large left pleural effusion and left lower lobe atelectasis.  ?-3/26 thoracentesis on the right 1.2 L removed.  ?-Improving with diuresis ?-3/30, left thoracentesis, 2 L drained ?  ?Subjective: Feels better overall, some sciatica pain in his legs, some discomfort across his chest ? ? ?Assessment & Plan: ? ?Acute CHF (congestive heart failure) ([place]) ?Pleural effusion ?Echo  with LV EF 60 to 65%, with mild left ventricular hypertrophy, preserved RV systolic function. ?-Diuresed with IV Lasix he is 11.6 L negative, weight down 25 lbs ?-Creatinine appears to be plateauing ?-Underwent thoracentesis on 3/26 with 1.2 L drained ?-Repeat thoracentesis 3/30, 2 L drained, follow-up cultures and cytology ?-Change Lasix to p.o., continue empagliflozin ?-Further GDMT limited by CKD4 ?-Discharge planning ? ?Pleurisy ?-Following thoracentesis, supportive care ?-Clinically do not suspect PE, has been on VTE prophylaxis this admission, Dopplers negative for DVT 3/26 ? ?Acute kidney injury superimposed on chronic kidney disease ([place]) ?CKD stage 3b  ?-Creatinine 2.6 on admission, improving with diuresis, now appears to be plateauing, 2.0 today ?-Baseline creatinine around 1.8 ? ?Chronic respiratory failure with hypoxia (HCC) ?Oxymetry is 92% on 2 L.min per [employer], at home patient is on chronic supplemental 02 per [place] 3 L/min. ? ?Type 2 diabetes mellitus with hyperlipidemia ([place]) ?-CBG stable, continue empagliflozin, sliding scale insulin ? ?Neuropathy, sciatica pain ?-Continue low-dose  gabapentin ? ?OSA (obstructive sleep apnea) ?Continue with Cpap.  ? ?Chronic anemia ?Leukopenia, anemia of iron deficiency combined with anemia of chronic disease.   ?Iron panel with serum iron 57, TIBC 361, transferrin saturation 16 and ferritin 258. ?Continue with oral iron supplementation.  ?-CBC in a.m. ? ?Obesity, Class III, BMI 40-49.9 (morbid obesity) ([place]) ?Calculated BMI is 54,33. ?He has a large ventral hernia on the left side and colostomy bag on the right side of his abdomen.  ? ?General debility ?-He is bed and wheelchair bound ?-PT OT eval completed, home health PT recommended ? ? ?DVT prophylaxis:Hep SQ ?Code Status: Full Code ?Family Communication: d/w pt, no family at bedside ?Disposition Plan: home likely tomorrow ? ?Procedures:  ? ?Antimicrobials:  ? ? ?Objective: ?Vitals:  ? 05/18/21 2000 05/19/21 0500 05/19/21 0902 05/19/21 1107  ?BP: (!) 113/51   (!) 106/50  ?Pulse: (!) 59  65 61  ?Resp: 18  18 20   ?Temp: (!) 97.5 ?F (36.4 ?C)   97.9 ?F (36.6 ?C)  ?TempSrc: Oral   Oral  ?SpO2: 100%  95% 97%  ?Weight:  (!) 168.8 kg    ?Height:      ? ? ?Intake/Output Summary (Last 24 hours) at 05/19/2021 1415 ?Last data filed at 05/19/2021 1023 ?Gross per 24 hour  ?Intake 720 ml  ?Output 1975 ml  ?Net -1255 ml  ? ?Filed Weights  ? 05/17/21 0422 05/18/21 0336 05/19/21 0500  ?Weight: (!) 171.4 kg (!) 170.7 kg (!) 168.8 kg  ? ? ?Examination: ? ?General exam: Morbidly obese chronically ill male sitting up in bed, AAOx3, no distress ?HEENT: No JVD ?CVS: S1-S2, regular rate rhythm ?Lungs: Decreased breath sounds on the left ?Abdomen: Soft, obese, nontender, lateral abdominal wall  edema, large abdominal hernia reducible  ?Extremities: Trace edema , foot deformities chronic ?Skin: No rashes ?Psychiatry: Judgement and insight appear normal. Mood & affect appropriate.  ? ? ? ?Data Reviewed:  ? ?CBC: ?Recent Labs  ?Lab 05/12/21 ?1830 05/14/21 ?0158 05/18/21 ?4627 05/19/21 ?0305  ?WBC 3.7* 3.3* 4.0 5.0  ?HGB 9.6* 8.9* 9.1*  9.3*  ?HCT 32.5* 29.5* 29.0* 30.9*  ?MCV 98.2 99.3 97.0 99.0  ?PLT 157 132* 133* 136*  ? ?Basic Metabolic Panel: ?Recent Labs  ?Lab 05/15/21 ?0637 05/16/21 ?0350 05/17/21 ?0356 05/18/21 ?0938 05/19/21 ?0305  ?NA 137 138 138 140 137  ?K 4.7 4.5 4.2 4.5 4.4  ?CL 105 103 104 102 100  ?CO2 27 28 27 30 28   ?GLUCOSE 118* 130* 107* 108* 99  ?BUN 28* 29* 29* 29* 28*  ?CREATININE 2.09* 2.15* 1.96* 2.08* 1.96*  ?CALCIUM 8.3* 8.5* 8.2* 8.3* 8.2*  ?MG  --   --  2.2  --   --   ? ?GFR: ?Estimated Creatinine Clearance: 55.8 mL/min (A) (by C-G formula based on SCr of 1.96 mg/dL (H)). ?Liver Function Tests: ?Recent Labs  ?Lab 05/12/21 ?2050  ?AST 13*  ?ALT 7  ?ALKPHOS 49  ?BILITOT 0.5  ?PROT 7.0  ?ALBUMIN 3.8  ? ?No results for input(s): LIPASE, AMYLASE in the last 168 hours. ?No results for input(s): AMMONIA in the last 168 hours. ?Coagulation Profile: ?No results for input(s): INR, PROTIME in the last 168 hours. ?Cardiac Enzymes: ?No results for input(s): CKTOTAL, CKMB, CKMBINDEX, TROPONINI in the last 168 hours. ?BNP (last 3 results) ?No results for input(s): PROBNP in the last 8760 hours. ?HbA1C: ?No results for input(s): HGBA1C in the last 72 hours. ?CBG: ?Recent Labs  ?Lab 05/18/21 ?1106 05/18/21 ?1651 05/18/21 ?2102 05/19/21 ?1829 05/19/21 ?1105  ?GLUCAP 165* 146* 161* 105* 162*  ? ?Lipid Profile: ?No results for input(s): CHOL, HDL, LDLCALC, TRIG, CHOLHDL, LDLDIRECT in the last 72 hours. ?Thyroid Function Tests: ?No results for input(s): TSH, T4TOTAL, FREET4, T3FREE, THYROIDAB in the last 72 hours. ?Anemia Panel: ?No results for input(s): VITAMINB12, FOLATE, FERRITIN, TIBC, IRON, RETICCTPCT in the last 72 hours. ? ?Urine analysis: ?   ?Component Value Date/Time  ? COLORURINE YELLOW 04/08/2008 1200  ? APPEARANCEUR CLEAR 04/08/2008 1200  ? LABSPEC 1.019 04/08/2008 1200  ? PHURINE 7.5 04/08/2008 1200  ? GLUCOSEU 250 (A) 04/08/2008 1200  ? HGBUR NEGATIVE 04/08/2008 1200  ? [place] NEGATIVE 04/08/2008 1200  ? KETONESUR TRACE  (A) 04/08/2008 1200  ? PROTEINUR 30 (A) 04/08/2008 1200  ? UROBILINOGEN 1.0 04/08/2008 1200  ? NITRITE NEGATIVE 04/08/2008 1200  ? LEUKOCYTESUR NEGATIVE 04/08/2008 1200  ? ?Sepsis Labs: ?@[email or web address] (procalcitonin:4,lacticidven:4) ? ?) ?Recent Results (from the past 240 hour(s))  ?Resp Panel by RT-PCR (Flu A&B, Covid) Nasopharyngeal Swab     Status: None  ? Collection Time: 05/12/21  8:51 PM  ? Specimen: Nasopharyngeal Swab; Nasopharyngeal(NP) swabs in vial transport medium  ?Result Value Ref Range Status  ? SARS Coronavirus 2 by RT PCR NEGATIVE NEGATIVE Final  ?  Comment: (NOTE) ?SARS-CoV-2 target nucleic acids are NOT DETECTED. ? ?The SARS-CoV-2 RNA is generally detectable in upper respiratory ?specimens during the acute phase of infection. The lowest ?concentration of SARS-CoV-2 viral copies this assay can detect is ?138 copies/mL. A negative result does not preclude SARS-Cov-2 ?infection and should not be used as the sole basis for treatment or ?other patient management decisions. A negative result may occur with  ?improper specimen collection/handling, submission of specimen other ?than nasopharyngeal swab,  presence of viral mutation(s) within the ?areas targeted by this assay, and inadequate number of viral ?copies(<138 copies/mL). A negative result must be combined with ?clinical observations, patient history, and epidemiological ?information. The expected result is Negative. ? ?Fact Sheet for Patients:  ?EntrepreneurPulse.com.au ? ?Fact Sheet for Healthcare Providers:  ?IncredibleEmployment.be ? ?This test is no t yet approved or cleared by the [place] FDA and  ?has been authorized for detection and/or diagnosis of SARS-CoV-2 by ?FDA under an Emergency Use Authorization (EUA). This EUA will remain  ?in effect (meaning this test can be used) for the duration of the ?COVID-19 declaration under Section 564(b)(1) of the Act, 21 ?U.S.C.section 360bbb-3(b)(1), unless the  authorization is terminated  ?or revoked sooner.  ? ? ?  ? Influenza A by PCR NEGATIVE NEGATIVE Final  ? Influenza B by PCR NEGATIVE NEGATIVE Final  ?  Comment: (NOTE) ?The Xpert Xpress SARS-CoV-2/FLU/RSV plus assay is int

## 2021-05-20 LAB — GLUCOSE, CAPILLARY
Glucose-Capillary: 106 mg/dL — ABNORMAL HIGH (ref 70–99)
Glucose-Capillary: 138 mg/dL — ABNORMAL HIGH (ref 70–99)
Glucose-Capillary: 143 mg/dL — ABNORMAL HIGH (ref 70–99)
Glucose-Capillary: 153 mg/dL — ABNORMAL HIGH (ref 70–99)

## 2021-05-20 LAB — BASIC METABOLIC PANEL
Anion gap: 6 (ref 5–15)
BUN: 28 mg/dL — ABNORMAL HIGH (ref 8–23)
CO2: 27 mmol/L (ref 22–32)
Calcium: 8.4 mg/dL — ABNORMAL LOW (ref 8.9–10.3)
Chloride: 103 mmol/L (ref 98–111)
Creatinine, Ser: 1.92 mg/dL — ABNORMAL HIGH (ref 0.61–1.24)
GFR, Estimated: 37 mL/min — ABNORMAL LOW (ref >60)
Glucose, Bld: 120 mg/dL — ABNORMAL HIGH (ref 70–99)
Potassium: 4.5 mmol/L (ref 3.5–5.1)
Sodium: 136 mmol/L (ref 135–145)

## 2021-05-20 MED ORDER — IPRATROPIUM-ALBUTEROL 0.5-2.5 (3) MG/3ML IN SOLN
3.0000 mL | RESPIRATORY_TRACT | Status: DC
Start: 2021-05-21 — End: 2021-05-20

## 2021-05-20 MED ORDER — FUROSEMIDE 10 MG/ML IJ SOLN
40.0000 mg | Freq: Two times a day (BID) | INTRAMUSCULAR | Status: AC
  Administered 2021-05-20: 40 mg via INTRAVENOUS
  Filled 2021-05-20: qty 4

## 2021-05-20 MED ORDER — FUROSEMIDE 40 MG PO TABS
40.0000 mg | ORAL_TABLET | Freq: Two times a day (BID) | ORAL | Status: DC
  Administered 2021-05-20 – 2021-05-21 (×2): 40 mg via ORAL
  Filled 2021-05-20 (×2): qty 1

## 2021-05-20 NOTE — Progress Notes (Addendum)
?PROGRESS NOTE ? ? ? ?MIR FULLILOVE  YPP:[id number] DOB: Jan 06, 1951 DOA: 05/12/2021 ?PCP: Ngetich, Nelda Bucks, NP  ?Narrative70/M morbidly obese, bed/wheelchair bound with CKD 3 b, hypertension, type 2 diabetes mellitus, sp colectomy/ colostomy, ambulatory dysfunction and chronic hypoxemic respiratory failure on 2 L home O2 who presented with dyspnea and lower extremity edema, x 2-3 weeks. weight gain 20 lbs over last 3 months.  ?-CT Chest with large left pleural effusion and left lower lobe atelectasis.  ?-3/26 thoracentesis on the right 1.2 L removed.  ?-Improving with diuresis ?-3/30, left thoracentesis, 2 L drained ?  ?Subjective: Feels better overall, some sciatica pain in his legs, some discomfort across his chest ? ? ?Assessment & Plan: ? ?Acute CHF (congestive heart failure) ([place]) ?Pleural effusion ?Echo  with LV EF 60 to 65%, with mild left ventricular hypertrophy, preserved RV systolic function. ?-Diuresed with IV Lasix he is 13.8 L negative, weight down 31 pounds ?-Creatinine appears to be plateauing ?-Underwent thoracentesis on 3/26 with 1.2 L drained ?-Repeat thoracentesis 3/30, 2 L drained, cultures negative, cytology pending ?-Repeat chest x-ray with still some evidence of pulmonary edema, will give additional dose of IV Lasix this morning, continue empagliflozin ?-Further GDMT limited by CKD4 ?-Discharge planning, home tomorrow if stable ? ?Pleurisy ?-Following thoracentesis, supportive care ?-Clinically do not suspect PE, has been on VTE prophylaxis this admission, Dopplers negative for DVT 3/26 ?-Improving ? ?Acute kidney injury superimposed on chronic kidney disease ([place]) ?CKD stage 3b  ?-Creatinine 2.6 on admission, improving with diuresis, now appears to be plateauing, 1.9 today ?-Baseline creatinine around 1.8 ? ?Chronic respiratory failure with hypoxia (HCC) ?Oxymetry is 92% on 2 L.min per [place], at home patient is on chronic supplemental 02 per [place] 3 L/min. ? ?Type 2 diabetes mellitus with  hyperlipidemia ([place]) ?-CBG stable, continue empagliflozin, sliding scale insulin ? ?Neuropathy, sciatica pain ?-Continue low-dose gabapentin ? ?OSA (obstructive sleep apnea) ?Continue with Cpap.  ? ?Chronic anemia ?Leukopenia, anemia of iron deficiency combined with anemia of chronic disease.   ?Iron panel with serum iron 57, TIBC 361, transferrin saturation 16 and ferritin 258. ?Continue with oral iron supplementation.  ?-CBC in a.m. ? ?Obesity, Class III, BMI 40-49.9 (morbid obesity) ([place]) ?Calculated BMI is 54,33. ?He has a large ventral hernia on the left side and colostomy bag on the right side of his abdomen.  ? ?General debility ?-He is bed and wheelchair bound ?-PT OT eval completed, home health PT recommended ? ? ?DVT prophylaxis:Hep SQ ?Code Status: d/w code status w/ pt, now DNR ?Family Communication: d/w pt, no family at bedside ?Disposition Plan: Back to independent living tomorrow with home health services ? ?Procedures:  ? ?Antimicrobials:  ? ? ?Objective: ?Vitals:  ? 05/19/21 2020 05/20/21 4580 05/20/21 0346 05/20/21 0721  ?BP:  (!) 117/51    ?Pulse:    62  ?Resp:  19  18  ?Temp:  98.3 ?F (36.8 ?C)    ?TempSrc:  Oral    ?SpO2: 99% 92%  94%  ?Weight:   (!) 168 kg   ?Height:      ? ? ?Intake/Output Summary (Last 24 hours) at 05/20/2021 1108 ?Last data filed at 05/20/2021 0945 ?Gross per 24 hour  ?Intake 600 ml  ?Output 2100 ml  ?Net -1500 ml  ? ?Filed Weights  ? 05/18/21 0336 05/19/21 0500 05/20/21 0346  ?Weight: (!) 170.7 kg (!) 168.8 kg (!) 168 kg  ? ? ?Examination: ? ?General exam: Obese chronically ill male sitting up in bed, AAOx3,  no distress ?HEENT: No JVD ?CVS: S1-S2, regular rate rhythm ?Lungs: Decreased breath sounds at the bases otherwise clear ?Abdomen: Soft, obese, nontender, lateral abdominal wall edema, large left lateral reducible hernia Extremities: Trace edema , foot deformities chronic ?Skin: No rashes ?Psychiatry: Judgement and insight appear normal. Mood & affect appropriate.   ? ? ? ?Data Reviewed:  ? ?CBC: ?Recent Labs  ?Lab 05/14/21 ?0158 05/18/21 ?8546 05/19/21 ?0305  ?WBC 3.3* 4.0 5.0  ?HGB 8.9* 9.1* 9.3*  ?HCT 29.5* 29.0* 30.9*  ?MCV 99.3 97.0 99.0  ?PLT 132* 133* 136*  ? ?Basic Metabolic Panel: ?Recent Labs  ?Lab 05/16/21 ?2703 05/17/21 ?0356 05/18/21 ?5009 05/19/21 ?3818 05/20/21 ?0353  ?NA 138 138 140 137 136  ?K 4.5 4.2 4.5 4.4 4.5  ?CL 103 104 102 100 103  ?CO2 28 27 30 28 27   ?GLUCOSE 130* 107* 108* 99 120*  ?BUN 29* 29* 29* 28* 28*  ?CREATININE 2.15* 1.96* 2.08* 1.96* 1.92*  ?CALCIUM 8.5* 8.2* 8.3* 8.2* 8.4*  ?MG  --  2.2  --   --   --   ? ?GFR: ?Estimated Creatinine Clearance: 56.8 mL/min (A) (by C-G formula based on SCr of 1.92 mg/dL (H)). ?Liver Function Tests: ?No results for input(s): AST, ALT, ALKPHOS, BILITOT, PROT, ALBUMIN in the last 168 hours. ? ?No results for input(s): LIPASE, AMYLASE in the last 168 hours. ?No results for input(s): AMMONIA in the last 168 hours. ?Coagulation Profile: ?No results for input(s): INR, PROTIME in the last 168 hours. ?Cardiac Enzymes: ?No results for input(s): CKTOTAL, CKMB, CKMBINDEX, TROPONINI in the last 168 hours. ?BNP (last 3 results) ?No results for input(s): PROBNP in the last 8760 hours. ?HbA1C: ?No results for input(s): HGBA1C in the last 72 hours. ?CBG: ?Recent Labs  ?Lab 05/19/21 ?2993 05/19/21 ?1105 05/19/21 ?1559 05/19/21 ?2107 05/20/21 ?7169  ?GLUCAP 105* 162* 167* 121* 106*  ? ?Lipid Profile: ?No results for input(s): CHOL, HDL, LDLCALC, TRIG, CHOLHDL, LDLDIRECT in the last 72 hours. ?Thyroid Function Tests: ?No results for input(s): TSH, T4TOTAL, FREET4, T3FREE, THYROIDAB in the last 72 hours. ?Anemia Panel: ?No results for input(s): VITAMINB12, FOLATE, FERRITIN, TIBC, IRON, RETICCTPCT in the last 72 hours. ? ?Urine analysis: ?   ?Component Value Date/Time  ? COLORURINE YELLOW 04/08/2008 1200  ? APPEARANCEUR CLEAR 04/08/2008 1200  ? LABSPEC 1.019 04/08/2008 1200  ? PHURINE 7.5 04/08/2008 1200  ? GLUCOSEU 250 (A)  04/08/2008 1200  ? HGBUR NEGATIVE 04/08/2008 1200  ? [place] NEGATIVE 04/08/2008 1200  ? KETONESUR TRACE (A) 04/08/2008 1200  ? PROTEINUR 30 (A) 04/08/2008 1200  ? UROBILINOGEN 1.0 04/08/2008 1200  ? NITRITE NEGATIVE 04/08/2008 1200  ? LEUKOCYTESUR NEGATIVE 04/08/2008 1200  ? ?Sepsis Labs: ?@LABRCNTIP (procalcitonin:4,lacticidven:4) ? ?) ?Recent Results (from the past 240 hour(s))  ?Resp Panel by RT-PCR (Flu A&B, Covid) Nasopharyngeal Swab     Status: None  ? Collection Time: 05/12/21  8:51 PM  ? Specimen: Nasopharyngeal Swab; Nasopharyngeal(NP) swabs in vial transport medium  ?Result Value Ref Range Status  ? SARS Coronavirus 2 by RT PCR NEGATIVE NEGATIVE Final  ?  Comment: (NOTE) ?SARS-CoV-2 target nucleic acids are NOT DETECTED. ? ?The SARS-CoV-2 RNA is generally detectable in upper respiratory ?specimens during the acute phase of infection. The lowest ?concentration of SARS-CoV-2 viral copies this assay can detect is ?138 copies/mL. A negative result does not preclude SARS-Cov-2 ?infection and should not be used as the sole basis for treatment or ?other patient management decisions. A negative result may occur with  ?improper  specimen collection/handling, submission of specimen other ?than nasopharyngeal swab, presence of viral mutation(s) within the ?areas targeted by this assay, and inadequate number of viral ?copies(<138 copies/mL). A negative result must be combined with ?clinical observations, patient history, and epidemiological ?information. The expected result is Negative. ? ?Fact Sheet for Patients:  ?EntrepreneurPulse.com.au ? ?Fact Sheet for Healthcare Providers:  ?IncredibleEmployment.be ? ?This test is no t yet approved or cleared by the [place] FDA and  ?has been authorized for detection and/or diagnosis of SARS-CoV-2 by ?FDA under an Emergency Use Authorization (EUA). This EUA will remain  ?in effect (meaning this test can be used) for the duration of  the ?COVID-19 declaration under Section 564(b)(1) of the Act, 21 ?U.S.C.section 360bbb-3(b)(1), unless the authorization is terminated  ?or revoked sooner.  ? ? ?  ? Influenza A by PCR NEGATIVE NEGATIVE Final  ? Influenza B by

## 2021-05-21 LAB — BASIC METABOLIC PANEL
Anion gap: 5 (ref 5–15)
BUN: 25 mg/dL — ABNORMAL HIGH (ref 8–23)
CO2: 29 mmol/L (ref 22–32)
Calcium: 8.4 mg/dL — ABNORMAL LOW (ref 8.9–10.3)
Chloride: 104 mmol/L (ref 98–111)
Creatinine, Ser: 1.86 mg/dL — ABNORMAL HIGH (ref 0.61–1.24)
GFR, Estimated: 38 mL/min — ABNORMAL LOW (ref >60)
Glucose, Bld: 122 mg/dL — ABNORMAL HIGH (ref 70–99)
Potassium: 4.5 mmol/L (ref 3.5–5.1)
Sodium: 138 mmol/L (ref 135–145)

## 2021-05-21 LAB — BODY FLUID CULTURE W GRAM STAIN
Culture: NO GROWTH
Gram Stain: NONE SEEN

## 2021-05-21 LAB — GLUCOSE, CAPILLARY
Glucose-Capillary: 103 mg/dL — ABNORMAL HIGH (ref 70–99)
Glucose-Capillary: 116 mg/dL — ABNORMAL HIGH (ref 70–99)
Glucose-Capillary: 161 mg/dL — ABNORMAL HIGH (ref 70–99)

## 2021-05-21 MED ORDER — FUROSEMIDE 40 MG PO TABS: 40.0000 mg | ORAL_TABLET | Freq: Two times a day (BID) | ORAL | 0 refills | Status: DC

## 2021-05-21 MED ORDER — GABAPENTIN 100 MG PO CAPS: 100.0000 mg | ORAL_CAPSULE | Freq: Two times a day (BID) | ORAL | 0 refills | Status: DC

## 2021-05-21 MED ORDER — TRAZODONE HCL 50 MG PO TABS: 50.0000 mg | ORAL_TABLET | Freq: Every day | ORAL | Status: DC

## 2021-05-21 MED ORDER — FERROUS SULFATE 325 (65 FE) MG PO TABS: 325.0000 mg | ORAL_TABLET | Freq: Every day | ORAL | 1 refills | Status: DC

## 2021-05-21 NOTE — Plan of Care (Signed)
?  Problem: Education: ?Goal: Knowledge of General Education information will improve ?Description: Including pain rating scale, medication(s)/side effects and non-pharmacologic comfort measures ?Outcome: Adequate for Discharge ?  ?Problem: Health Behavior/Discharge Planning: ?Goal: Ability to manage health-related needs will improve ?Outcome: Adequate for Discharge ?  ?Problem: Clinical Measurements: ?Goal: Ability to maintain clinical measurements within normal limits will improve ?Outcome: Adequate for Discharge ?Goal: Will remain free from infection ?Outcome: Adequate for Discharge ?Goal: Diagnostic test results will improve ?Outcome: Adequate for Discharge ?Goal: Respiratory complications will improve ?Outcome: Adequate for Discharge ?Goal: Cardiovascular complication will be avoided ?Outcome: Adequate for Discharge ?  ?Problem: Activity: ?Goal: Risk for activity intolerance will decrease ?Outcome: Adequate for Discharge ?  ?Problem: Coping: ?Goal: Level of anxiety will decrease ?Outcome: Adequate for Discharge ?  ?Problem: Elimination: ?Goal: Will not experience complications related to bowel motility ?Outcome: Adequate for Discharge ?Goal: Will not experience complications related to urinary retention ?Outcome: Adequate for Discharge ?  ?Problem: Pain Managment: ?Goal: General experience of comfort will improve ?Outcome: Adequate for Discharge ?  ?Problem: Safety: ?Goal: Ability to remain free from injury will improve ?Outcome: Adequate for Discharge ?  ?Problem: Skin Integrity: ?Goal: Risk for impaired skin integrity will decrease ?Outcome: Adequate for Discharge ?  ?Problem: Education: ?Goal: Ability to demonstrate management of disease process will improve ?Outcome: Adequate for Discharge ?Goal: Ability to verbalize understanding of medication therapies will improve ?Outcome: Adequate for Discharge ?Goal: Individualized Educational Video(s) ?Outcome: Adequate for Discharge ?  ?Problem: Activity: ?Goal:  Capacity to carry out activities will improve ?Outcome: Adequate for Discharge ?  ?

## 2021-05-21 NOTE — Discharge Summary (Addendum)
Physician Discharge Summary  ?INA [name] AST:[id number] DOB: Jun 15, 1950 DOA: 05/12/2021 ? ?PCP: Ngetich, Nelda Bucks, NP ? ?Admit date: 05/12/2021 ?Discharge date: 05/21/2021 ? ?Time spent: 23mnutes ? ?Recommendations for Outpatient Follow-up:  ?Cardiology, [facility] in 2 weeks ?2. Nephrology-[facility] in 2 weeks, referral sent ?3. PCP in 1 week-please check BMP at [facility] ? ? ?Discharge Diagnoses:  ?Principal Problem: ?  Acute diastolic CHF (congestive heart failure) ([place] ?  CKD3b ?  Bed/Wheel chair bound ?  Acute kidney injury superimposed on chronic kidney disease ([place] ?  Chronic respiratory failure with hypoxia (HCC) ?  Type 2 diabetes mellitus with hyperlipidemia ([place] ?  OSA (obstructive sleep apnea) ?  Chronic anemia ?  Obesity, Class III, BMI 40-49.9 (morbid obesity) ([place] ?  Foot deformities ?DNR ? ?Discharge Condition: stable ? ?Diet recommendation: diabetic, low sodium ? ?Filed Weights  ? 05/19/21 0500 05/20/21 0346 05/21/21 0332  ?Weight: (!) 168.8 kg (!) 168 kg (!) 164.8 kg  ? ? ?History of present illness:  ?Narrative70/M morbidly obese, bed/wheelchair bound with CKD 3 b, hypertension, type 2 diabetes mellitus, sp colectomy/ colostomy, ambulatory dysfunction and chronic hypoxemic respiratory failure on 2 L home O2 who presented with dyspnea and lower extremity edema, x 2-3 weeks. weight gain 20 lbs over last 3 months.  ?-CT Chest with large left pleural effusion and left lower lobe atelectasis ? ?Hospital Course:  ? ?Acute CHF (congestive heart failure) ([place] ?Pleural effusion ?Echo  with LV EF 60 to 65%, with mild left ventricular hypertrophy, preserved RV systolic function. ?-Diuresed with IV Lasix he is  17 L negative, weight down 38 pounds ?-Creatinine improved and plateaued at 1.9-2 range ?-Underwent thoracentesis on 3/26 with 1.2 L drained ?-Repeat thoracentesis 3/30, 2 L drained, cultures negative, cytology negative  ?-switched to po lasix 435mBID ?--Further GDMT limited by  CKD4 ?-[facility] ?-referral sent to Cardiology and Nephrology ?  ?Pleurisy ?-Following thoracentesis, supportive care ?-Clinically do not suspect PE, has been on VTE prophylaxis this admission, Dopplers negative for DVT 3/26 ?-Improving ?  ?Acute kidney injury superimposed on chronic kidney disease (HCBern[place]?CKD stage 3b  ?-Creatinine 2.6 on admission, improving with diuresis, now appears to be plateauing, 1.9 today ?-Baseline creatinine around 1.8 ?  ?Chronic respiratory failure with hypoxia (HCC) ?Oxymetry is 92% on 2 L.min per [place], at home patient is on chronic supplemental 02 per [place] 3 L/min. ?  ?Type 2 diabetes mellitus with hyperlipidemia (HCR[place]o [place]?-CBG stable, continue empagliflozin ?  ?Neuropathy, sciatica pain ?-Continue low-dose gabapentin ?  ?OSA (obstructive sleep apnea) ?Continue with Cpap.  ?  ?Chronic anemia ?Leukopenia, anemia of iron deficiency combined with anemia of chronic disease.   ?Iron panel with serum iron 57, TIBC 361, transferrin saturation 16 and ferritin 258. ?Continue with oral iron supplementation.  ?  ?Obesity, Class III, BMI 40-49.9 (morbid obesity) (HCGib[place]?Calculated BMI is 54,33. ?He has a large ventral hernia on the left side and colostomy bag on the right side of his abdomen.  ?  ?General debility ?-He is bed and wheelchair bound ?-PT OT eval completed, home health PT recommended ? ? ?Discharge Exam: ?Vitals:  ? 05/21/21 0330 05/21/21 1106  ?BP: 105/60 (!) 103/56  ?Pulse: 61 62  ?Resp: 19 18  ?Temp: (!) 97.5 ?F (36.4 ?C) 98.3 ?F (36.8 ?C)  ?SpO2: 94% 96%  ? ?General exam: Obese chronically ill male sitting up in bed, AAOx3, no distress ?HEENT: No JVD ?CVS: S1-S2, regular rate  rhythm ?Lungs: Decreased breath sounds at the bases otherwise clear ?Abdomen: Soft, obese, nontender, lateral abdominal wall edema, large left lateral reducible hernia Extremities: Trace edema , foot deformities chronic ?Skin: No rashes ?Psychiatry: Judgement and insight appear normal.  Mood & affect appropriate.  ?  ? ?Discharge Instructions ? ? ?Discharge Instructions   ? ? Ambulatory referral to Cardiology   Complete by: As directed ?  ? Chronic diastolic CHF  ? Ambulatory referral to Nephrology   Complete by: As directed ?  ? CKD 4  ? Diet - low sodium heart healthy   Complete by: As directed ?  ? Diet Carb Modified   Complete by: As directed ?  ? Increase activity slowly   Complete by: As directed ?  ? ?  ? ?Allergies as of 05/21/2021   ? ?   Reactions  ? Tape Rash  ? ?  ? ?  ?Medication List  ?  ? ?STOP taking these medications   ? ?bumetanide 1 MG tablet ?Commonly known as: BUMEX ?  ?isosorbide mononitrate 60 MG 24 hr tablet ?Commonly known as: IMDUR ?  ?Levemir FlexPen 100 UNIT/ML FlexPen ?Generic drug: insulin detemir ?  ?metFORMIN 500 MG tablet ?Commonly known as: GLUCOPHAGE ?  ?metolazone 2.5 MG tablet ?Commonly known as: ZAROXOLYN ?  ?pioglitazone 45 MG tablet ?Commonly known as: ACTOS ?  ?valsartan 40 MG tablet ?Commonly known as: DIOVAN ?  ? ?  ? ?TAKE these medications   ? ?alfuzosin 10 MG 24 hr tablet ?Commonly known as: UROXATRAL ?Take 10 mg by mouth daily. ?  ?ALPRAZolam 0.5 MG tablet ?Commonly known as: Duanne Moron ?Take 1 tablet (0.5 mg total) by mouth daily as needed. ?What changed: reasons to take this ?  ?aspirin EC 81 MG tablet ?Take 81 mg by mouth daily. Swallow whole. ?  ?cyanocobalamin 1000 MCG tablet ?Take 1,000 mcg by mouth daily. ?  ?DULoxetine 60 MG capsule ?Commonly known as: CYMBALTA ?Take 60 mg by mouth daily. ?  ?empagliflozin 25 MG Tabs tablet ?Commonly known as: JARDIANCE ?Take 25 mg by mouth daily. ?  ?ferrous sulfate 325 (65 FE) MG tablet ?Take 1 tablet (325 mg total) by mouth daily with breakfast. ?Start taking on: May 22, 2021 ?  ?furosemide 40 MG tablet ?Commonly known as: LASIX ?Take 1 tablet (40 mg total) by mouth 2 (two) times daily. ?  ?gabapentin 100 MG capsule ?Commonly known as: NEURONTIN ?Take 1 capsule (100 mg total) by mouth 2 (two) times daily. ?   ?glucose blood test strip ?Use to test blood sugar once daily. Dx:E11.22 ?  ?IMODIUM PO ?Take 1 capsule by mouth See admin instructions. Take 1 capsule by mouth about 9 times daily per patient ?  ?ketoconazole 2 % cream ?Commonly known as: NIZORAL ?Apply 1 application topically as needed for irritation. ?  ?liraglutide 18 MG/3ML Sopn ?Commonly known as: VICTOZA ?Inject 1.8 mg into the skin daily. ?  ?nystatin powder ?Generic drug: nystatin ?Apply 1 application. topically 2 (two) times daily. to affected area ?  ?ONE TOUCH ULTRA MINI w/Device Kit ?Use to test blood sugar once daily. Dx:E11.22 ?  ?RED YEAST RICE PO ?Take by mouth daily. ?  ?rosuvastatin 10 MG tablet ?Commonly known as: CRESTOR ?Take 1 tablet by mouth daily. ?  ?topiramate 25 MG tablet ?Commonly known as: TOPAMAX ?Take 25 mg by mouth daily. ?  ?traMADol 50 MG tablet ?Commonly known as: ULTRAM ?TAKE 1 TABLET(50 MG) BY MOUTH TWICE DAILY AS NEEDED ?What changed: See the  new instructions. ?  ?traZODone 50 MG tablet ?Commonly known as: DESYREL ?Take 1 tablet (50 mg total) by mouth at bedtime. ?What changed: how much to take ?  ?TURMERIC PO ?Take 150 mg by mouth 2 (two) times daily. ?  ? ?  ? ?Allergies  ?Allergen Reactions  ? Tape Rash  ? ? Follow-up Information   ? ? Ngetich, Nelda Bucks, NP. Go on 05/23/2021.   ?Specialty: Family Medicine ?Why: @2 :45pm ?Contact information: ?[address] ?[place] [address] ?[phone] ? ? ?  ?  ? ?  ?  ? ?  ? ? ? ?The results of significant diagnostics from this hospitalization (including imaging, microbiology, ancillary and laboratory) are listed below for reference.   ? ?Significant Diagnostic Studies: ?DG Chest 1 View ? ?Result Date: 05/18/2021 ?CLINICAL DATA:  Status post left thoracentesis EXAM: CHEST  1 VIEW COMPARISON:  05/17/2021 FINDINGS: Transverse diameter of heart is increased. There is decrease in the left pleural effusion. There is residual smaller left pleural effusion. There is improvement in aeration in  the left lower lung fields which may be due to decrease in pleural effusion and possibly decrease in underlying infiltrates. There are no signs of alveolar pulmonary edema. There is no pneumothorax. IMPRESSION: The

## 2021-05-21 NOTE — Progress Notes (Signed)
Discharge instructions, RX's and follow up appts explained and provided to patient verbalized understanding. Patient transported home by [facility] services. No c/o pain or shortness of breath at d/c. ? ?Tierney Behl, Tivis Ringer, RN' ?

## 2021-05-22 ENCOUNTER — Telehealth: Payer: Self-pay | Admitting: *Deleted

## 2021-05-22 NOTE — TOC Progression Note (Signed)
Transition of Care (TOC) - Progression Note  ? ? ?Patient Details  ?Name: Jesse [name] ?MRN: [id number] ?Date of Birth: [birth year] ? ?Transition of Care (TOC) CM/SW Contact  ?Zenon Mayo, RN ?Phone Number: ?05/22/2021, 10:49 AM ? ?Clinical Narrative:    ?Daughter Lily  called NCM she states they would like HHRN and HHaidre as well she is ok with [place].  NCM informed Dr. Broadus John  she gave this NCM verbal order for [facility] and [place].  NCM made referral to [facility] with [facility].  He is able to take this referral.  Soc will begin 24 to 48 hrs . ? ? ?Expected Discharge Plan: [place] ?Barriers to Discharge: Continued Medical Work up ? ?Expected Discharge Plan and Services ?Expected Discharge Plan: [place] ?  ?Discharge Planning Services: CM Consult ?Post Acute Care Choice: Resumption of Svcs/PTA Provider ?Living arrangements for the past 2 months: [place] ?Expected Discharge Date: 05/21/21               ?  ?DME Agency: NA ?  ?  ?  ?HH Arranged: PT ?[place] Agency:  Secondary school teacher on site) ?  ?  ?  ? ? ?Social Determinants of Health (SDOH) Interventions ?Food Insecurity Interventions: Intervention Not Indicated ?Financial Strain Interventions: Intervention Not Indicated ?Housing Interventions: Intervention Not Indicated ?Transportation Interventions:  (would like to speak about transportation with CSW) ? ?Readmission Risk Interventions ? ?  05/15/2021  ?  2:21 PM  ?Readmission Risk Prevention Plan  ?Transportation Screening Complete  ?PCP or Specialist Appt within 3-5 Days Complete  ?[place] or Home Care Consult Complete  ?Social Work Consult for [place] Planning/Counseling Complete  ?Palliative Care Screening Not Applicable  ?Medication Review Press photographer) Complete  ? ? ?

## 2021-05-22 NOTE — Telephone Encounter (Signed)
Transition Care Management Follow-up Telephone Call ?Date of discharge and from where: 05/21/2021 [facility] ?How have you been since you were released from the hospital? Very weak ?Any questions or concerns? No ? ?Items Reviewed: ?Did the pt receive and understand the discharge instructions provided? Yes  ?Medications obtained and verified? Yes  ?Other? No  ?Any new allergies since your discharge? No  ?Dietary orders reviewed? Yes ?Do you have support at home? Yes  ? ?Home Care and Equipment/Supplies: ?Were home health services ordered? yes ?If so, what is the name of the agency? Not Sure  ?Has the agency set up a time to come to the patient's home? no ?Were any new equipment or medical supplies ordered?  No ?What is the name of the medical supply agency? na ?Were you able to get the supplies/equipment? not applicable ?Do you have any questions related to the use of the equipment or supplies? No ? ?Functional Questionnaire: (I = Independent and D = Dependent) ?ADLs: I ? ?Bathing/Dressing- I ? ?Meal Prep- I ? ?Eating- I ? ?Maintaining continence- I ? ?Transferring/Ambulation- I with assistance ? ?Managing Meds- I ? ?Follow up appointments reviewed: ? ?PCP Hospital f/u appt confirmed? Yes  Scheduled to see [name] 05/23/2021 @ 2:40 ?[place] [facility] f/u appt confirmed? No   ?Are transportation arrangements needed? No  ?If their condition worsens, is the pt aware to call PCP or go to the Emergency Dept.? Yes ?Was the patient provided with contact information for the PCP's office or ED? Yes ?Was to pt encouraged to call back with questions or concerns? Yes  ?

## 2021-05-23 ENCOUNTER — Encounter: Payer: Self-pay | Admitting: Family

## 2021-05-23 ENCOUNTER — Ambulatory Visit: Payer: Medicare PPO | Admitting: Family

## 2021-05-23 VITALS — BP 120/70 | HR 79 | Temp 97.5°F | Resp 19 | Ht 72.0 in | Wt 363.3 lb

## 2021-05-23 DIAGNOSIS — R5381 Other malaise: Secondary | ICD-10-CM

## 2021-05-23 DIAGNOSIS — I5022 Chronic systolic (congestive) heart failure: Secondary | ICD-10-CM | POA: Diagnosis not present

## 2021-05-23 DIAGNOSIS — G4733 Obstructive sleep apnea (adult) (pediatric): Secondary | ICD-10-CM

## 2021-05-23 DIAGNOSIS — N183 Chronic kidney disease, stage 3 unspecified: Secondary | ICD-10-CM

## 2021-05-23 DIAGNOSIS — N1831 Chronic kidney disease, stage 3a: Secondary | ICD-10-CM

## 2021-05-23 DIAGNOSIS — I129 Hypertensive chronic kidney disease with stage 1 through stage 4 chronic kidney disease, or unspecified chronic kidney disease: Secondary | ICD-10-CM

## 2021-05-23 DIAGNOSIS — J90 Pleural effusion, not elsewhere classified: Secondary | ICD-10-CM

## 2021-05-23 DIAGNOSIS — G63 Polyneuropathy in diseases classified elsewhere: Secondary | ICD-10-CM

## 2021-05-23 DIAGNOSIS — E1122 Type 2 diabetes mellitus with diabetic chronic kidney disease: Secondary | ICD-10-CM | POA: Diagnosis not present

## 2021-05-23 DIAGNOSIS — D649 Anemia, unspecified: Secondary | ICD-10-CM

## 2021-05-23 DIAGNOSIS — F411 Generalized anxiety disorder: Secondary | ICD-10-CM

## 2021-05-23 MED ORDER — ALPRAZOLAM 0.5 MG PO TABS: 0.5000 mg | ORAL_TABLET | Freq: Two times a day (BID) | ORAL | 0 refills | Status: DC | PRN

## 2021-05-23 NOTE — Progress Notes (Signed)
? ?Provider: Marlowe Sax FNP-C  ? ?Priscille Shadduck, Nelda Bucks, NP ? ?Patient Care Team: ?Pariss Hommes, Nelda Bucks, NP as PCP - General (Family Medicine) ? ?Extended Emergency Contact Information ?Primary Emergency Contact: Hemmelgarn,MICHAELINE W ?Address: [address]         [place], [place] [address] [place] ?Home Phone: [phone] ?Mobile Phone: [phone] ?Relation: Friend ?Secondary Emergency Contact: Gloster,[name] ?Mobile Phone: [phone] ?Relation: Daughter ?Preferred language: English ?Interpreter needed? No ? ?Code Status:  Full Code  ?Goals of care: Advanced Directive information ? ?  05/23/2021  ?  9:46 AM  ?Advanced Directives  ?Does Patient Have a Medical Advance Directive? Yes  ?Type of Paramedic of [place];Living will  ?Does patient want to make changes to medical advance directive? No - Patient declined  ?Copy of [place] in Chart? Yes - validated most recent copy scanned in chart (See row information)  ? ? ? ?Chief Complaint  ?Patient presents with  ? Transitions Of Care  ?  TOC 05/12/2021-05/21/2021  ? Concern   ?  Moderate Fall Risk.  ? ? ?HPI:  ?Pt is a 71 y.o. male seen today for transition of care post hospitalization from 05/12/2021 to 05/21/2021 after presenting with dyspnea and lower extremity edema for 2 to 3 weeks with a weight gain of 20 pounds over 3 months.  He is here with the wife today.  ? CT scan of the chest done showed large left pleural effusion and left lower lobe atelectasis.  He was diuresed with IV Lasix and also underwent thoracentesis on 05/14/2021 1.2 L was drained also had a repeat of thoracentesis on 05/18/2021 2 more liters was drained cultures were negative. His Bumex and metaxalone were then discontinued and switched to furosemide 40 mg twice daily.   ?Echo done showed LVEF of 60 to 65%.  With mild ventricular hypertrophy and preserved RV systolic function.  He was advised to follow-up with outpatient cardiac cardiologist.  Has  appointment with cardiologist on 05/25/2021. ? ?His admission was also complicated with acute kidney injury on chronic kidney disease stage III his creatinine on admission was 2.6 but improved with diuresis down to 1.9.  With his baseline being 1.8. ? ?He was continued on oxygen 2 L via nasal cannula for chronic respiratory failure with hypoxia. ? ?Also continued on his diabetic medication blood sugars were stable. ? ?He was evaluated by physical therapy home health physical therapy was recommended.  Has physical therapy of the facility where he resides. ?He states since he was told that he had kidney disease and being referred to nephrology he is anxiety and depression has worsened.  He request alprazolam dosage to be increased.  No suicidal ideation oriented to self or others. ? ?Also complains of left leg second toe dried up blister and right great toenail that he could have accidentally hit.  Has follow-up appointment with podiatrist for evaluation. ? ? ?Past Medical History:  ?Diagnosis Date  ? Alcohol use disorder in remission   ? Anxiety and depression   ? At high risk for falls   ? Former cigarette smoker   ? Generalized anxiety disorder   ? Hard of hearing   ? Hyperlipidemia due to type 2 diabetes mellitus ([place])   ? Hypertension   ? Morbid obesity ([place])   ? Neuropathy   ? O2 dependent   ? Risk for falls   ? Sleep apnea   ? Type 2 diabetes mellitus ([place])   ? ?  Past Surgical History:  ?Procedure Laterality Date  ? APPENDECTOMY  1980  ? IR THORACENTESIS ASP PLEURAL SPACE W/IMG GUIDE  05/18/2021  ? KNEE SURGERY Right   ? ? ?Allergies  ?Allergen Reactions  ? Tape Rash  ? ? ?Allergies as of 05/23/2021   ? ?   Reactions  ? Tape Rash  ? ?  ? ?  ?Medication List  ?  ? ?  ? Accurate as of May 23, 2021  7:41 PM. If you have any questions, ask your nurse or doctor.  ?  ?  ? ?  ? ?alfuzosin 10 MG 24 hr tablet ?Commonly known as: UROXATRAL ?Take 10 mg by mouth daily. ?  ?ALPRAZolam 0.5 MG tablet ?Commonly known as:  Duanne Moron ?Take 1 tablet (0.5 mg total) by mouth 2 (two) times daily as needed. ?What changed: when to take this ?Changed by: [name], NP ?  ?aspirin EC 81 MG tablet ?Take 81 mg by mouth daily. Swallow whole. ?  ?cyanocobalamin 1000 MCG tablet ?Take 1,000 mcg by mouth daily. ?  ?DULoxetine 60 MG capsule ?Commonly known as: CYMBALTA ?Take 60 mg by mouth daily. ?  ?empagliflozin 25 MG Tabs tablet ?Commonly known as: JARDIANCE ?Take 25 mg by mouth daily. ?  ?ferrous sulfate 325 (65 FE) MG tablet ?Take 1 tablet (325 mg total) by mouth daily with breakfast. ?  ?furosemide 40 MG tablet ?Commonly known as: LASIX ?Take 1 tablet (40 mg total) by mouth 2 (two) times daily. ?  ?gabapentin 100 MG capsule ?Commonly known as: NEURONTIN ?Take 1 capsule (100 mg total) by mouth 2 (two) times daily. ?  ?glucose blood test strip ?Use to test blood sugar once daily. Dx:E11.22 ?  ?IMODIUM PO ?Take 1 capsule by mouth See admin instructions. Take 1 capsule by mouth about 9 times daily per patient ?  ?ketoconazole 2 % cream ?Commonly known as: NIZORAL ?Apply 1 application topically as needed for irritation. ?  ?liraglutide 18 MG/3ML Sopn ?Commonly known as: VICTOZA ?Inject 1.8 mg into the skin daily. ?  ?nystatin powder ?Generic drug: nystatin ?Apply 1 application. topically 2 (two) times daily. to affected area ?  ?ONE TOUCH ULTRA MINI w/Device Kit ?Use to test blood sugar once daily. Dx:E11.22 ?  ?RED YEAST RICE PO ?Take by mouth daily. ?  ?rosuvastatin 10 MG tablet ?Commonly known as: CRESTOR ?Take 1 tablet by mouth daily. ?  ?topiramate 25 MG tablet ?Commonly known as: TOPAMAX ?Take 25 mg by mouth daily. ?  ?traMADol 50 MG tablet ?Commonly known as: ULTRAM ?TAKE 1 TABLET(50 MG) BY MOUTH TWICE DAILY AS NEEDED ?  ?traZODone 50 MG tablet ?Commonly known as: DESYREL ?Take 1 tablet (50 mg total) by mouth at bedtime. ?  ?TURMERIC PO ?Take 150 mg by mouth 2 (two) times daily. ?  ? ?  ? ? ?Review of Systems  ?Constitutional:  Negative for  appetite change, chills, fatigue, fever and unexpected weight change.  ?HENT:  Positive for hearing loss. Negative for congestion, dental problem, ear discharge, ear pain, facial swelling, nosebleeds, postnasal drip, rhinorrhea, sinus pressure, sinus pain, sneezing, sore throat, tinnitus and trouble swallowing.   ?Eyes:  Negative for pain, discharge, redness and itching.  ?Respiratory:  Negative for cough, chest tightness, shortness of breath and wheezing.   ?     Oxygen via nasal cannula   ?Cardiovascular:  Positive for leg swelling. Negative for chest pain and palpitations.  ?Gastrointestinal:  Negative for abdominal distention, abdominal pain, blood in stool, constipation,  diarrhea, nausea and vomiting.  ?     Colostomy   ?Endocrine: Negative for cold intolerance, heat intolerance, polydipsia, polyphagia and polyuria.  ?Genitourinary:  Negative for difficulty urinating, dysuria, flank pain, frequency and urgency.  ?Musculoskeletal:  Positive for arthralgias and gait problem. Negative for back pain, joint swelling, myalgias, neck pain and neck stiffness.  ?Skin:  Negative for color change, pallor, rash and wound.  ?Neurological:  Negative for dizziness, syncope, speech difficulty, weakness, light-headedness, numbness and headaches.  ?Hematological:  Does not bruise/bleed easily.  ?Psychiatric/Behavioral:  Negative for agitation, behavioral problems, confusion, hallucinations, self-injury, sleep disturbance and suicidal ideas. The patient is nervous/anxious.   ? ?Immunization History  ?Administered Date(s) Administered  ? Fluad Quad(high Dose 65+) 11/19/2020  ? H1N1 01/24/2008  ? Moderna Sars-Covid-2 Vaccination 04/03/2019, 05/05/2019, 12/20/2019, 05/23/2020  ? Zoster Recombinat (Shingrix) 03/01/2020  ? ?Pertinent  Health Maintenance Due  ?Topic Date Due  ? FOOT EXAM  Never done  ? OPHTHALMOLOGY EXAM  Never done  ? URINE MICROALBUMIN  Never done  ? INFLUENZA VACCINE  09/19/2021  ? HEMOGLOBIN A1C  11/13/2021  ?  COLONOSCOPY (Pts 45-82yr Insurance coverage will need to be confirmed)  Discontinued  ? ? ?  05/19/2021  ? 10:15 AM 05/19/2021  ?  9:00 PM 05/20/2021  ?  8:25 AM 05/21/2021  ?  7:45 AM 05/23/2021  ?  9:44 AM  ?Fall Risk  ?

## 2021-05-24 ENCOUNTER — Telehealth: Payer: Self-pay

## 2021-05-24 LAB — CBC WITH DIFFERENTIAL/PLATELET
Absolute Monocytes: 552 cells/uL (ref 200–950)
Basophils Absolute: 38 cells/uL (ref 0–200)
Basophils Relative: 0.8 %
Eosinophils Absolute: 341 cells/uL (ref 15–500)
Eosinophils Relative: 7.1 %
HCT: 32.1 % — ABNORMAL LOW (ref 38.5–50.0)
Hemoglobin: 10.1 g/dL — ABNORMAL LOW (ref 13.2–17.1)
Lymphs Abs: 734 cells/uL — ABNORMAL LOW (ref 850–3900)
MCH: 30.6 pg (ref 27.0–33.0)
MCHC: 31.5 g/dL — ABNORMAL LOW (ref 32.0–36.0)
MCV: 97.3 fL (ref 80.0–100.0)
MPV: 9 fL (ref 7.5–12.5)
Monocytes Relative: 11.5 %
Neutro Abs: 3134 cells/uL (ref 1500–7800)
Neutrophils Relative %: 65.3 %
Platelets: 194 10*3/uL (ref 140–400)
RBC: 3.3 10*6/uL — ABNORMAL LOW (ref 4.20–5.80)
RDW: 14.6 % (ref 11.0–15.0)
Total Lymphocyte: 15.3 %
WBC: 4.8 10*3/uL (ref 3.8–10.8)

## 2021-05-24 LAB — BASIC METABOLIC PANEL WITH GFR
BUN/Creatinine Ratio: 16 (calc) (ref 6–22)
BUN: 33 mg/dL — ABNORMAL HIGH (ref 7–25)
CO2: 29 mmol/L (ref 20–32)
Calcium: 8.7 mg/dL (ref 8.6–10.3)
Chloride: 104 mmol/L (ref 98–110)
Creat: 2.11 mg/dL — ABNORMAL HIGH (ref 0.70–1.28)
Glucose, Bld: 162 mg/dL — ABNORMAL HIGH (ref 65–99)
Potassium: 4.6 mmol/L (ref 3.5–5.3)
Sodium: 139 mmol/L (ref 135–146)
eGFR: 33 mL/min/{1.73_m2} — ABNORMAL LOW (ref >=60)

## 2021-05-24 NOTE — Telephone Encounter (Signed)
If Home Health PT not ordered during hospital discharge will be glad to order.I see on hospital discharge [facility] PT. Please verify which Agency of Home health that is available at your facility then notify our office to send order for PT  ?

## 2021-05-24 NOTE — Addendum Note (Signed)
Addended byMarlowe Sax C on: 05/24/2021 04:21 PM ? ? Modules accepted: Orders ? ?

## 2021-05-24 NOTE — Telephone Encounter (Signed)
Patient called w/ [facility] nurse on the phone for a home health referral. He stated that [place] Ngetich,NP was going to place a referral for him to get home health services and I didn't see any orders placed. I advised patient that someone with the home health will call him after order is placed. ?

## 2021-05-25 ENCOUNTER — Ambulatory Visit: Payer: Medicare PPO | Admitting: Internal Medicine

## 2021-05-25 ENCOUNTER — Encounter: Payer: Self-pay | Admitting: Internal Medicine

## 2021-05-25 VITALS — BP 100/50 | HR 62 | Ht 72.0 in | Wt 370.0 lb

## 2021-05-25 DIAGNOSIS — I5031 Acute diastolic (congestive) heart failure: Secondary | ICD-10-CM | POA: Diagnosis not present

## 2021-05-25 NOTE — Progress Notes (Signed)
?Cardiology Office Note:   ? ?Date:  05/25/2021  ? ?ID:  Jesse [name], DOB 15-Mar-1950, MRN [id number] ? ?PCP:  Ngetich, Nelda Bucks, NP ?  ?[facility] Providers ?Cardiologist:  Janina Mayo, MD    ? ?Referring MD: Domenic Polite, MD  ? ?No chief complaint on file. ?HFpEF ? ?History of Present Illness:   ? ?Jesse [name] is a 71 y.o. male with a hx of CKD3b, obesity,  bed/wheelchair bound,  hypertension, type 2 diabetes mellitus 5.9 %, sp colectomy/ colostomy, iron deficiency anemia on oral iron, OSA on CPAP, HFpEF ? ?Per her EMR report. He presented with acute on chronic respiratory failure and noted LE edema. His BNP was 218. Hgb 9.1. He was diuresed. He had a large pleural effusion s/p thora yielding 1.2 L.  Fluid is hazy, cloudy and yellow. Cytology was negative. Had a repeat 2L drained. Non infectious.He was diuresed with IV Lasix he is  17 L negative, weight down 38 pounds. His dry weight is ~ 363 pounds. He takes lasix 40 mg BID. ? ?Wt Readings from Last 3 Encounters:  ?05/25/21 (!) 370 lb (167.8 kg)  ?05/23/21 (!) 363 lb 5.1 oz (164.8 kg)  ?05/21/21 (!) 363 lb 5.1 oz (164.8 kg)  ? ?TC- 94 ?HDL 39 ?LDL 38 ?TSH normal ? ?Crt 2.1 on 05/23/2021 from 1.8-2 ?, K 4.6 ? ?He notes PT looked at his legs prior to the hospital and she instructed him to seek care. This was the first time he was admitted for for decompensated HF. He's been in the hospital. 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  He has a large hernia.He has hx of  CKD3a/b. He was being seen in [place] for fluid retention related to kidney disease. He's been on metolazone and bumex in the past. He self adjusted his diuretics. He does not have scale. He sleeps in a recliner and hoping to trial his bed that's at an incline.  His L>R of his legs He had no DVT. Noted did not want DNR.  He has a living will. His son and wife are designated medical power of attorney. ? ?His BP meds imdur and valsartan were stopped with soft Bps.   ? ?Cardiology Studies: ?EKG 05/16/2021: junctional rhythm RBBB morphology ?HDQ:QIWL quality very challenging to fully assess WMA. EF 60-65%, RV moderately enlarged. Svi 40 cc/m2. Dilate LA. Mean MV gradiient 5 mmHg rate 64 bpm noted has mild-mod MS. Mild AS. IVC dialted, with RA pressure of 15 mmHg. Diastolgy indeterminate. 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 ?Past Medical History:  ?Diagnosis Date  ? Alcohol use disorder in remission   ? Anxiety and depression   ? At high risk for falls   ? Former cigarette smoker   ? Generalized anxiety disorder   ? Hard of hearing   ? Hyperlipidemia due to type 2 diabetes mellitus ([place])   ? Hypertension   ? Morbid obesity ([place])   ? Neuropathy   ? O2 dependent   ? Risk for falls   ? Sleep apnea   ? Type 2 diabetes mellitus ([place])   ? ? ?Past Surgical History:  ?Procedure Laterality Date  ? APPENDECTOMY  1980  ? IR THORACENTESIS ASP PLEURAL SPACE W/IMG GUIDE  05/18/2021  ? KNEE SURGERY Right   ? ? ?Current Medications: ?Current Meds  ?Medication Sig  ? alfuzosin (UROXATRAL) 10 MG 24 hr tablet Take 10 mg by mouth daily.  ? ALPRAZolam (XANAX) 0.5 MG tablet Take  1 tablet (0.5 mg total) by mouth 2 (two) times daily as needed.  ? aspirin EC 81 MG tablet Take 81 mg by mouth daily. Swallow whole.  ? Blood Glucose Monitoring Suppl (ONE TOUCH ULTRA MINI) w/Device KIT Use to test blood sugar once daily. Dx:E11.22  ? cyanocobalamin 1000 MCG tablet Take 1,000 mcg by mouth daily.  ? DULoxetine (CYMBALTA) 60 MG capsule Take 60 mg by mouth daily.  ? empagliflozin (JARDIANCE) 25 MG TABS tablet Take 25 mg by mouth daily.  ? ferrous sulfate 325 (65 FE) MG tablet Take 1 tablet (325 mg total) by mouth daily with breakfast.  ? furosemide (LASIX) 40 MG tablet Take 1 tablet (40 mg total) by mouth 2 (two) times daily.  ? gabapentin (NEURONTIN) 100 MG capsule Take 1 capsule (100 mg total) by mouth 2 (two) times daily.  ? glucose blood test strip Use to test blood sugar once daily. Dx:E11.22  ? ketoconazole  (NIZORAL) 2 % cream Apply 1 application topically as needed for irritation.  ? liraglutide (VICTOZA) 18 MG/3ML SOPN Inject 1.8 mg into the skin daily.  ? Loperamide HCl (IMODIUM PO) Take 1 capsule by mouth See admin instructions. Take 1 capsule by mouth about 9 times daily per patient  ? NYSTATIN powder Apply 1 application. topically 2 (two) times daily. to affected area  ? Red Yeast Rice Extract (RED YEAST RICE PO) Take by mouth daily.  ? rosuvastatin (CRESTOR) 10 MG tablet Take 1 tablet by mouth daily.  ? topiramate (TOPAMAX) 25 MG tablet Take 25 mg by mouth daily.  ? traMADol (ULTRAM) 50 MG tablet TAKE 1 TABLET(50 MG) BY MOUTH TWICE DAILY AS NEEDED  ? traZODone (DESYREL) 50 MG tablet Take 1 tablet (50 mg total) by mouth at bedtime.  ? TURMERIC PO Take 150 mg by mouth 2 (two) times daily.  ?  ? ?Allergies:   Tape  ? ?Social History  ? ?Socioeconomic History  ? Marital status: Divorced  ?  Spouse name: Not on file  ? Number of children: 3  ? Years of education: Not on file  ? Highest education level: Master's degree (e.g., MA, MS, MEng, MEd, MSW, MBA)  ?Occupational History  ? Occupation: Retired  ?Tobacco Use  ? Smoking status: Former  ?  Packs/day: 1.00  ?  Types: Cigarettes  ?  Quit date: 1999  ?  Years since quitting: 24.2  ? Smokeless tobacco: Never  ?Vaping Use  ? Vaping Use: Never used  ?Substance and Sexual Activity  ? Alcohol use: Not Currently  ? Drug use: Never  ? Sexual activity: Not on file  ?Other Topics Concern  ? Not on file  ?Social History Narrative  ? Tobacco use, amount per day now: 0  ? Past tobacco use, amount per day: 1 pack  ? How many years did you use tobacco: 10 last 1999  ? Alcohol use (drinks per week): 1/5 day last 1999  ? Diet:  ? Do you drink/eat things with caffeine: Yes  ? Marital status:   Divorced                               What year were you married? 1980  ? Do you live in a house, apartment, assisted living, condo, trailer, etc.?   ? Is it one or more stories? 1  ? How many  persons live in your home? 1  ? Do you have pets in  your home?( please list) Cat  ? Highest Level of education completed? Post Grad  ? Current or past profession: Insurance account manager.  ? Do you exercise? Little                                 Type and how often?  ? Do you have a living will? Yes  ? Do you have a DNR form?        No                           If not, do you want to discuss one?  ? Do you have signed POA/HPOA forms?   Yes                     If so, please bring to you appointment  ?   ? Do you have any difficulty bathing or dressing yourself? Yes  ? Do you have any difficulty preparing food or eating? No  ? Do you have any difficulty managing your medications? No  ? Do you have any difficulty managing your finances? No  ? Do you have any difficulty affording your medications?  No  ? ?Social Determinants of Health  ? ?Financial Resource Strain: Low Risk   ? Difficulty of Paying Living Expenses: Not hard at all  ?Food Insecurity: No Food Insecurity  ? Worried About Charity fundraiser in the Last Year: Never true  ? Ran Out of Food in the Last Year: Never true  ?Transportation Needs: No Transportation Needs  ? Lack of Transportation (Medical): No  ? Lack of Transportation (Non-Medical): No  ?Physical Activity: Not on file  ?Stress: Not on file  ?Social Connections: Not on file  ?  ? ?Family History: ?The patient's family history includes Anxiety disorder in his son; Bipolar disorder in his daughter; Diabetes in his brother. ? ?ROS:   ?Please see the history of present illness.    ? All other systems reviewed and are negative. ? ?EKGs/Labs/Other Studies Reviewed:   ? ?The following studies were reviewed today: ? ? ?Recent Labs: ?03/10/2021: TSH 1.99 ?05/12/2021: ALT 7; B Natriuretic Peptide 218.8 ?05/17/2021: Magnesium 2.2 ?05/23/2021: BUN 33; Creat 2.11; Hemoglobin 10.1; Platelets 194; Potassium 4.6; Sodium 139  ?Recent Lipid Panel ?   ?Component Value Date/Time  ? CHOL 94 03/10/2021 1002  ? TRIG 83 03/10/2021  1002  ? HDL 39 (L) 03/10/2021 1002  ? CHOLHDL 2.4 03/10/2021 1002  ? LDLCALC 38 03/10/2021 1002  ? ? ? ?Risk Assessment/Calculations:   ?  ? ?    ? ?Physical Exam:   ? ?VS:  ?Vitals:  ? 05/25/21 1135  ?BP: (!) 100/

## 2021-05-25 NOTE — Patient Instructions (Signed)
Medication Instructions:  ?No Changes In Medications at this time.  ?*If you need a refill on your cardiac medications before your next appointment, please call your pharmacy* ? ?Follow-Up: ?At [facility], you and your health needs are our priority.  As part of our continuing mission to provide you with exceptional heart care, we have created designated Provider Care Teams.  These Care Teams include your primary Cardiologist (physician) and Advanced Practice Providers (APPs -  Physician Assistants and Nurse Practitioners) who all work together to provide you with the care you need, when you need it. ? ?Your next appointment:   ?3 month(s) ? ?The format for your next appointment:   ?In Person ? ?Provider:   ?Janina Mayo, MD   ?

## 2021-05-29 NOTE — Progress Notes (Incomplete)
? ? ?HEART & VASCULAR TRANSITION OF CARE CONSULT NOTE  ? ? ? ?Referring Physician: ?Primary Care: ?Primary Cardiologist: ? ?HPI: ?Referred to clinic by *** for heart failure consultation.  ? ?Cardiac Testing  ? ? ?Review of Systems: [y] = yes, _0  = no  ? ?General: Weight gain _1 ; Weight loss _2 ; Anorexia _3 ; Fatigue _4 ; Fever _5 ; Chills _6 ; Weakness _7   ?Cardiac: Chest pain/pressure _8 ; Resting SOB _9 ; Exertional SOB _10 ; Orthopnea _11 ; Pedal Edema _12 ; Palpitations _13 ; Syncope _14 ; Presyncope _15 ; Paroxysmal nocturnal dyspnea_16   ?Pulmonary: Cough _17 ; Wheezing_18 ; Hemoptysis_19 ; Sputum _20 ; Snoring _21   ?GI: Vomiting_22 ; Dysphagia_23 ; Melena_24 ; Hematochezia _25 ; Heartburn_26 ; Abdominal pain _27 ; Constipation _28 ; Diarrhea _29 ; BRBPR _30   ?GU: Hematuria_31 ; Dysuria _32 ; Nocturia_33   ?Vascular: Pain in legs with walking _34 ; Pain in feet with lying flat _35 ; Non-healing sores _36 ; Stroke _37 ; TIA _38 ; Slurred speech _39 ;  ?Neuro: Headaches_40 ; Vertigo_41 ; Seizures_42 ; Paresthesias_43 ;Blurred vision _44 ; Diplopia _45 ; Vision changes _46   ?Ortho/Skin: Arthritis _47 ; Joint pain _48 ; Muscle pain _49 ; Joint swelling _50 ; Back Pain _51 ; Rash _52   ?Psych: Depression_53 ; Anxiety_54   ?Heme: Bleeding problems _55 ; Clotting disorders _56 ; Anemia _57   ?Endocrine: Diabetes _58 ; Thyroid dysfunction_59  ? ? ?Past Medical History:  ?Diagnosis Date  ? Alcohol use disorder in remission   ? Anxiety and depression   ? At high risk for falls   ? Former cigarette smoker   ? Generalized anxiety disorder   ? Hard of hearing   ? Hyperlipidemia due to type 2 diabetes mellitus ([place])   ? Hypertension   ? Morbid obesity ([place])   ? Neuropathy   ? O2 dependent   ? Risk for falls   ? Sleep apnea   ? Type 2 diabetes mellitus ([place])   ? ? ?Current Outpatient Medications  ?Medication Sig Dispense Refill  ? alfuzosin (UROXATRAL) 10 MG 24 hr tablet Take 10 mg by mouth daily.    ? ALPRAZolam (XANAX) 0.5 MG tablet Take 1 tablet (0.5 mg total)  by mouth 2 (two) times daily as needed. 60 tablet 0  ? aspirin EC 81 MG tablet Take 81 mg by mouth daily. Swallow whole.    ? Blood Glucose Monitoring Suppl (ONE TOUCH ULTRA MINI) w/Device KIT Use to test blood sugar once daily. Dx:E11.22 1 kit 0  ? cyanocobalamin 1000 MCG tablet Take 1,000 mcg by mouth daily.    ? DULoxetine (CYMBALTA) 60 MG capsule Take 60 mg by mouth daily.    ? empagliflozin (JARDIANCE) 25 MG TABS tablet Take 25 mg by mouth daily.    ? ferrous sulfate 325 (65 FE) MG tablet Take 1 tablet (325 mg total) by mouth daily with breakfast. 30 tablet 1  ? furosemide (LASIX) 40 MG tablet Take 1 tablet (40 mg total) by mouth 2 (two) times daily. 30 tablet 0  ? gabapentin (NEURONTIN) 100 MG capsule Take 1 capsule (100 mg total) by mouth 2 (two) times daily. 60 capsule 0  ? glucose blood test strip Use to test blood sugar once daily. Dx:E11.22 100 each 3  ? ketoconazole (NIZORAL) 2 % cream Apply 1 application topically as needed for irritation. 15 g 5  ? liraglutide (  VICTOZA) 18 MG/3ML SOPN Inject 1.8 mg into the skin daily.    ? Loperamide HCl (IMODIUM PO) Take 1 capsule by mouth See admin instructions. Take 1 capsule by mouth about 9 times daily per patient    ? NYSTATIN powder Apply 1 application. topically 2 (two) times daily. to affected area    ? Red Yeast Rice Extract (RED YEAST RICE PO) Take by mouth daily.    ? rosuvastatin (CRESTOR) 10 MG tablet Take 1 tablet by mouth daily.    ? topiramate (TOPAMAX) 25 MG tablet Take 25 mg by mouth daily.    ? traMADol (ULTRAM) 50 MG tablet TAKE 1 TABLET(50 MG) BY MOUTH TWICE DAILY AS NEEDED 60 tablet 3  ? traZODone (DESYREL) 50 MG tablet Take 1 tablet (50 mg total) by mouth at bedtime.    ? TURMERIC PO Take 150 mg by mouth 2 (two) times daily.    ? ?No current facility-administered medications for this visit.  ? ? ?Allergies  ?Allergen Reactions  ? Tape Rash  ? ? ?  ?Social History  ? ?Socioeconomic History  ? Marital status: Divorced  ?  Spouse name: Not on file   ? Number of children: 3  ? Years of education: Not on file  ? Highest education level: Master's degree (e.g., MA, MS, MEng, MEd, MSW, MBA)  ?Occupational History  ? Occupation: Retired  ?Tobacco Use  ? Smoking status: Former  ?  Packs/day: 1.00  ?  Types: Cigarettes  ?  Quit date: 1999  ?  Years since quitting: 24.2  ? Smokeless tobacco: Never  ?Vaping Use  ? Vaping Use: Never used  ?Substance and Sexual Activity  ? Alcohol use: Not Currently  ? Drug use: Never  ? Sexual activity: Not on file  ?Other Topics Concern  ? Not on file  ?Social History Narrative  ? Tobacco use, amount per day now: 0  ? Past tobacco use, amount per day: 1 pack  ? How many years did you use tobacco: 10 last 1999  ? Alcohol use (drinks per week): 1/5 day last 1999  ? Diet:  ? Do you drink/eat things with caffeine: Yes  ? Marital status:   Divorced                               What year were you married? 1980  ? Do you live in a house, apartment, assisted living, condo, trailer, etc.?   ? Is it one or more stories? 1  ? How many persons live in your home? 1  ? Do you have pets in your home?( please list) Cat  ? Highest Level of education completed? Post Grad  ? Current or past profession: Insurance account manager.  ? Do you exercise? Little                                 Type and how often?  ? Do you have a living will? Yes  ? Do you have a DNR form?        No                           If not, do you want to discuss one?  ? Do you have signed POA/HPOA forms?   Yes  If so, please bring to you appointment  ?   ? Do you have any difficulty bathing or dressing yourself? Yes  ? Do you have any difficulty preparing food or eating? No  ? Do you have any difficulty managing your medications? No  ? Do you have any difficulty managing your finances? No  ? Do you have any difficulty affording your medications?  No  ? ?Social Determinants of Health  ? ?Financial Resource Strain: Low Risk   ? Difficulty of Paying Living Expenses: Not hard at  all  ?Food Insecurity: No Food Insecurity  ? Worried About Charity fundraiser in the Last Year: Never true  ? Ran Out of Food in the Last Year: Never true  ?Transportation Needs: No Transportation Needs  ? Lack of Transportation (Medical): No  ? Lack of Transportation (Non-Medical): No  ?Physical Activity: Not on file  ?Stress: Not on file  ?Social Connections: Not on file  ?Intimate Partner Violence: Not on file  ? ? ?  ?Family History  ?Problem Relation Age of Onset  ? Diabetes Brother   ? Bipolar disorder Daughter   ? Anxiety disorder Son   ? ? ?There were no vitals filed for this visit. ? ?PHYSICAL EXAM: ?General:  Well appearing. No respiratory difficulty ?HEENT: normal ?Neck: supple. no JVD. Carotids 2+ bilat; no bruits. No lymphadenopathy or thryomegaly appreciated. ?Cor: PMI nondisplaced. Regular rate & rhythm. No rubs, gallops or murmurs. ?Lungs: clear ?Abdomen: soft, nontender, nondistended. No hepatosplenomegaly. No bruits or masses. Good bowel sounds. ?Extremities: no cyanosis, clubbing, rash, edema ?Neuro: alert & oriented x 3, cranial nerves grossly intact. moves all 4 extremities w/o difficulty. Affect pleasant. ? ?ECG: ? ? ?ASSESSMENT & PLAN: ? ?NYHA *** ?GDMT  ?Diuretic- ?BB- ?Ace/ARB/ARNI ?MRA ?SGLT2i ? ? ? ?Referred to HFSW (PCP, Medications, Transportation, ETOH Abuse, Drug Abuse, Insurance, Financial ): Yes or No ?Refer to Pharmacy: Yes or No ?Refer to Home Health: Yes on No ?Refer to Advanced Heart Failure Clinic: Yes or no  ?Refer to General Cardiology: Yes or No ? ?Follow up  ? ?

## 2021-05-30 ENCOUNTER — Telehealth: Payer: Self-pay

## 2021-05-30 ENCOUNTER — Encounter (HOSPITAL_COMMUNITY): Payer: Self-pay

## 2021-05-30 ENCOUNTER — Encounter (HOSPITAL_COMMUNITY): Payer: Medicare PPO

## 2021-05-30 NOTE — Telephone Encounter (Signed)
Beth from [facility] called requesting verbal orders for patient to receive Home Care for 8 weeks.  ? ?Verbal orders given.  ?

## 2021-06-06 ENCOUNTER — Telehealth: Payer: Self-pay | Admitting: *Deleted

## 2021-06-06 NOTE — Telephone Encounter (Signed)
Faxed this Encounter to [facility] to draw labs.  ? ?

## 2021-06-06 NOTE — Telephone Encounter (Signed)
Okay to fax lab results TSH level,Hgb A 1 C,Hepatic panel and Lipid panel for Home health to draw. ?

## 2021-06-06 NOTE — Telephone Encounter (Signed)
Patient called and stated that he is scheduled for Monday for [facility] and patient is wanting to know if an order can be sent to [facility] for Sharyon Cable to draw his blood. Stated that it will save him some time and be easier on him.  ? ?Stated that he needs a order faxed to Fax: [phone].  ? ?Please Advise.  ?

## 2021-06-07 ENCOUNTER — Other Ambulatory Visit: Payer: Self-pay | Admitting: *Deleted

## 2021-06-07 MED ORDER — FUROSEMIDE 40 MG PO TABS: 40.0000 mg | ORAL_TABLET | Freq: Two times a day (BID) | ORAL | 5 refills | Status: DC

## 2021-06-07 NOTE — Telephone Encounter (Signed)
Patient called requesting refill. Stated that he received in [facility] and he takes it twice daily. They only gave him #30 and he needs a refill.  ? ?Pended Rx and sent to [facility] for approval.  ?

## 2021-06-08 NOTE — Telephone Encounter (Signed)
Home health orders was placed on 05/24/21 by [name] Ngetich,NP ?

## 2021-06-12 ENCOUNTER — Other Ambulatory Visit: Payer: Medicare PPO

## 2021-06-13 ENCOUNTER — Telehealth: Payer: Self-pay | Admitting: Internal Medicine

## 2021-06-13 ENCOUNTER — Telehealth: Payer: Self-pay

## 2021-06-13 LAB — LIPID PANEL
Cholesterol: 101 (ref 0–200)
HDL: 41 (ref 35–70)
LDL Cholesterol: 42
Triglycerides: 95 (ref 40–160)

## 2021-06-13 LAB — HEPATIC FUNCTION PANEL
ALT: 10 U/L (ref 10–40)
AST: 15 (ref 14–40)
Alkaline Phosphatase: 75 (ref 25–125)
Bilirubin, Direct: 0.2 (ref 0.01–0.4)
Bilirubin, Total: 0.5

## 2021-06-13 LAB — TSH: TSH: 2.02 (ref 0.41–5.90)

## 2021-06-13 LAB — HEMOGLOBIN A1C: Hemoglobin A1C: 5.7

## 2021-06-13 NOTE — Telephone Encounter (Signed)
Spoke with [facility] nurse. She reports 7lb weight gain.. She reports he is unstable d/t club feet so other home weights patient may have may not be as accurate as he may have been leaning. Swelling is worse than previous, worsening SOB over 2 days. He is on lasix 40mg  BID - he has good UOP. Patient is following fluid restrictions and following 2gm sodium diet. Sleep is same - no new orthopnea.  ? ?370lbs on 4/6 ?377lbs today  ? ?Beth did LFT, lipid, TSH, A1c per PCP today ?If any labs need to be added, can be called to [facility] for add on ? ?Will route to Dr. Acquanetta Sit RN to advise on if changes/new orders are needed ?

## 2021-06-13 NOTE — Telephone Encounter (Signed)
Called Jesse [name] about his weight gain. He notes leg swelling. Recommended he double lasix dose for until Friday. If no resolution of symptoms advised that he call back. If weight steadily increasing and still SOB can be schedule as an urgent visit and can assess whether he needs an admission ?

## 2021-06-13 NOTE — Telephone Encounter (Signed)
Pt's home health nurse states when she was with pt today, pt's weight is up 7 lbs from last week, edema is greater than last week as well. Nurse also states that pt states he has been SOB with chest discomfort. Please advise ?

## 2021-06-13 NOTE — Telephone Encounter (Signed)
Spoke with [employer] and she stated that she will inform patient of [name]'s response to go to the ED.  ?

## 2021-06-13 NOTE — Telephone Encounter (Signed)
Jesse [name] with [phone] Patient complains increased shortness of breath, lower extremity edema worse, gain 7 lbs within a week. Patient gets chest tightness when taking deep breaths. Shortness of breath past 2 days. ? ?Message routed to Marlowe Sax, NP ?

## 2021-06-13 NOTE — Telephone Encounter (Signed)
Recommend ED evaluation for shortness of breath and chest tightness.  ?

## 2021-06-14 ENCOUNTER — Telehealth: Payer: Self-pay

## 2021-06-14 NOTE — Telephone Encounter (Signed)
Incoming fax received from [facility] review requesting additional information for metforrmin rx. ? ?After a through review of active medication list (checked x 3), we do not have patient listed as taking metformin. A notation was made on fax and sent back to [facility]. ? ? ? ? ?

## 2021-06-16 ENCOUNTER — Ambulatory Visit (INDEPENDENT_AMBULATORY_CARE_PROVIDER_SITE_OTHER): Payer: Medicare PPO | Admitting: Family

## 2021-06-16 ENCOUNTER — Encounter: Payer: Self-pay | Admitting: Family

## 2021-06-16 ENCOUNTER — Telehealth: Payer: Self-pay | Admitting: *Deleted

## 2021-06-16 ENCOUNTER — Encounter: Payer: Self-pay | Admitting: Internal Medicine

## 2021-06-16 VITALS — BP 142/80 | HR 59 | Temp 97.8°F | Resp 20 | Ht 72.0 in | Wt 374.0 lb

## 2021-06-16 DIAGNOSIS — E1122 Type 2 diabetes mellitus with diabetic chronic kidney disease: Secondary | ICD-10-CM | POA: Diagnosis not present

## 2021-06-16 DIAGNOSIS — E785 Hyperlipidemia, unspecified: Secondary | ICD-10-CM

## 2021-06-16 DIAGNOSIS — S81812A Laceration without foreign body, left lower leg, initial encounter: Secondary | ICD-10-CM

## 2021-06-16 DIAGNOSIS — I129 Hypertensive chronic kidney disease with stage 1 through stage 4 chronic kidney disease, or unspecified chronic kidney disease: Secondary | ICD-10-CM

## 2021-06-16 DIAGNOSIS — F411 Generalized anxiety disorder: Secondary | ICD-10-CM

## 2021-06-16 DIAGNOSIS — I5022 Chronic systolic (congestive) heart failure: Secondary | ICD-10-CM

## 2021-06-16 DIAGNOSIS — E1169 Type 2 diabetes mellitus with other specified complication: Secondary | ICD-10-CM

## 2021-06-16 DIAGNOSIS — N183 Chronic kidney disease, stage 3 unspecified: Secondary | ICD-10-CM

## 2021-06-16 MED ORDER — LANCET DEVICE MISC: 1.0000 | Freq: Every day | 11 refills | Status: DC

## 2021-06-16 NOTE — Telephone Encounter (Signed)
[name] saw patient today and order [place].  ?Written orders faxed to [facility] to Fax: [phone] ? ?"Home Health Nurse to Apply three layer wraps to left leg from base of the toes to the knee high. Change dressing every 3 days for left leg edema.  ? ?HHN to cleanse Left leg skin tear with saline, pat dry, apply small amount of triple antibiotic ointment and cover with foam dressing for protection and absorption.  ?Change dressing every 3 days." ? ? ?Called and notified Beth with [name] [phone] ? ? ?

## 2021-06-16 NOTE — Progress Notes (Signed)
? ?Provider: Marlowe Sax FNP-C  ? ?Jesse [name], Jesse Bucks, Jesse [name] ? ?Patient Care Team: ?Jesse [name], Jesse Bucks, Jesse [name] as PCP - General (Family Medicine) ?Janina Mayo, MD as PCP - Cardiology (Cardiology) ? ?Extended Emergency Contact Information ?Primary Emergency Contact: Obarr,MICHAELINE W ?Address: [address]         [place], [place] [address] [place] ?Mobile Phone: [phone] ?Relation: Friend ?Secondary Emergency Contact: Urey,[name] ?Mobile Phone: [phone] ?Relation: Daughter ?Preferred language: English ?Interpreter needed? No ? ?Code Status:  Full Code  ?Goals of care: Advanced Directive information ? ?  06/16/2021  ?  1:11 PM  ?Advanced Directives  ?Does Patient Have a Medical Advance Directive? Yes  ?Type of Paramedic of [place];Living will  ?Does patient want to make changes to medical advance directive? No - Patient declined  ?Copy of [place] in Chart? Yes - validated most recent copy scanned in chart (See row information)  ? ? ? ?Chief Complaint  ?Patient presents with  ? Medical Management of Chronic Issues  ?  Patient is here for a  3 months follow-up  ? ? ?HPI:  ?Pt is a 71 y.o. male seen today for 3 months follow-up for medical management of chronic diseases.He is here with Friend Michaeline.  Has a medical history hypertension, type 2 diabetes mellitus, chronic systolic congestive heart failure EF 60 to 65%, chronic kidney disease stage III, generalized anxiety disorder, hyperlipidemia, former smoker, peripheral neuropathy, oxygen dependent, obstructive sleep apnea among other conditions ? ?States recently had an episode of increased shortness of breath and worsening leg edema. His cardiologist advised him to increase furosemide to 160 mg tablet which has helped with his symptoms.Weight still 4 pounds higher compared to previous visit. ? ?He complains of left leg skin tear which he has been cleaning and covering with a foam dressing.  States does not recall what he could have hit because of the skin tear.  Has had some yellow clear drainage due to swelling in the legs. ? ?Past Medical History:  ?Diagnosis Date  ? Alcohol use disorder in remission   ? Anxiety and depression   ? At high risk for falls   ? Former cigarette smoker   ? Generalized anxiety disorder   ? Hard of hearing   ? Hyperlipidemia due to type 2 diabetes mellitus ([place])   ? Hypertension   ? Morbid obesity ([place])   ? Neuropathy   ? O2 dependent   ? Risk for falls   ? Sleep apnea   ? Type 2 diabetes mellitus ([place])   ? ?Past Surgical History:  ?Procedure Laterality Date  ? APPENDECTOMY  1980  ? IR THORACENTESIS ASP PLEURAL SPACE W/IMG GUIDE  05/18/2021  ? KNEE SURGERY Right   ? ? ?Allergies  ?Allergen Reactions  ? Tape Rash  ? ? ?Allergies as of 06/16/2021   ? ?   Reactions  ? Tape Rash  ? ?  ? ?  ?Medication List  ?  ? ?  ? Accurate as of June 16, 2021 11:59 PM. If you have any questions, ask your nurse or doctor.  ?  ?  ? ?  ? ?alfuzosin 10 MG 24 hr tablet ?Commonly known as: UROXATRAL ?Take 10 mg by mouth daily. ?  ?ALPRAZolam 0.5 MG tablet ?Commonly known as: Duanne Moron ?Take 1 tablet (0.5 mg total) by mouth 2 (two) times daily as needed. ?  ?aspirin EC 81 MG tablet ?Take 81 mg by mouth daily.  Swallow whole. ?  ?cyanocobalamin 1000 MCG tablet ?Take 1,000 mcg by mouth daily. ?  ?DULoxetine 60 MG capsule ?Commonly known as: CYMBALTA ?Take 60 mg by mouth daily. ?  ?empagliflozin 25 MG Tabs tablet ?Commonly known as: JARDIANCE ?Take 25 mg by mouth daily. ?  ?ferrous sulfate 325 (65 FE) MG tablet ?Take 1 tablet (325 mg total) by mouth daily with breakfast. ?  ?furosemide 40 MG tablet ?Commonly known as: LASIX ?Take 1 tablet (40 mg total) by mouth 2 (two) times daily. ?  ?gabapentin 100 MG capsule ?Commonly known as: NEURONTIN ?Take 1 capsule (100 mg total) by mouth 2 (two) times daily. ?  ?glucose blood test strip ?Use to test blood sugar once daily. Dx:E11.22 ?  ?IMODIUM PO ?Take 1 capsule  by mouth See admin instructions. Take 1 capsule by mouth about 9 times daily per patient ?  ?ketoconazole 2 % cream ?Commonly known as: NIZORAL ?Apply 1 application topically as needed for irritation. ?  ?Lancet Device Misc ?1 Device by Does not apply route daily. ?  ?liraglutide 18 MG/3ML Sopn ?Commonly known as: VICTOZA ?Inject 1.8 mg into the skin daily. ?  ?nystatin powder ?Generic drug: nystatin ?Apply 1 application. topically 2 (two) times daily. to affected area ?  ?ONE TOUCH ULTRA MINI w/Device Kit ?Use to test blood sugar once daily. Dx:E11.22 ?  ?RED YEAST RICE PO ?Take by mouth daily. ?  ?rosuvastatin 10 MG tablet ?Commonly known as: CRESTOR ?Take 1 tablet by mouth daily. ?  ?topiramate 25 MG tablet ?Commonly known as: TOPAMAX ?Take 25 mg by mouth daily. ?  ?traMADol 50 MG tablet ?Commonly known as: ULTRAM ?TAKE 1 TABLET(50 MG) BY MOUTH TWICE DAILY AS NEEDED ?  ?traZODone 50 MG tablet ?Commonly known as: DESYREL ?Take 1 tablet (50 mg total) by mouth at bedtime. ?  ?TURMERIC PO ?Take 150 mg by mouth 2 (two) times daily. ?  ? ?  ? ?  ?  ? ? ?  ?Durable Medical Equipment  ?(From admission, onward)  ?  ? ? ?  ? ?  Start     Ordered  ? 06/16/21 0000  For home use only DME Other see comment       ?Comments: Silicone Comfort foam dressing change dressing daily  ?Quantity # 30 with 3 refills.  ?Question:  Length of Need  Answer:  Lifetime  ? 06/16/21 1331  ? ?  ?  ? ?  ? ? ?Review of Systems  ?Constitutional:  Negative for appetite change, chills, fatigue, fever and unexpected weight change.  ?HENT:  Positive for hearing loss. Negative for congestion, dental problem, ear discharge, ear pain, facial swelling, nosebleeds, postnasal drip, rhinorrhea, sinus pressure, sinus pain, sneezing, sore throat, tinnitus and trouble swallowing.   ?Eyes:  Negative for pain, discharge, redness, itching and visual disturbance.  ?Respiratory:  Negative for cough, chest tightness, shortness of breath and wheezing.   ?     On chronic  oxygen via nasal cannula  ?Cardiovascular:  Positive for leg swelling. Negative for chest pain and palpitations.  ?Gastrointestinal:  Negative for abdominal distention, abdominal pain, blood in stool, constipation, diarrhea, nausea and vomiting.  ?     Colostomy  ?Endocrine: Negative for cold intolerance, heat intolerance, polydipsia, polyphagia and polyuria.  ?Genitourinary:  Negative for difficulty urinating, dysuria, flank pain, frequency and urgency.  ?Musculoskeletal:  Positive for arthralgias and gait problem. Negative for back pain, joint swelling, myalgias, neck pain and neck stiffness.  ?Skin:  Positive for  wound. Negative for color change, pallor and rash.  ?     Left leg skin tear  ?Neurological:  Negative for dizziness, syncope, speech difficulty, weakness, light-headedness and headaches.  ?Hematological:  Does not bruise/bleed easily.  ?Psychiatric/Behavioral:  Negative for agitation, behavioral problems, confusion, hallucinations, self-injury, sleep disturbance and suicidal ideas. The patient is not nervous/anxious.   ? ?Immunization History  ?Administered Date(s) Administered  ? Fluad Quad(high Dose 65+) 11/19/2020  ? H1N1 01/24/2008  ? Moderna Sars-Covid-2 Vaccination 04/03/2019, 05/05/2019, 12/20/2019, 05/23/2020  ? Zoster Recombinat (Shingrix) 03/01/2020  ? ?Pertinent  Health Maintenance Due  ?Topic Date Due  ? FOOT EXAM  Never done  ? OPHTHALMOLOGY EXAM  Never done  ? URINE MICROALBUMIN  Never done  ? INFLUENZA VACCINE  09/19/2021  ? HEMOGLOBIN A1C  12/13/2021  ? COLONOSCOPY (Pts 45-42yr Insurance coverage will need to be confirmed)  Discontinued  ? ? ?  05/19/2021  ? 10:15 AM 05/19/2021  ?  9:00 PM 05/20/2021  ?  8:25 AM 05/21/2021  ?  7:45 AM 05/23/2021  ?  9:44 AM  ?Fall Risk  ?Falls in the past year?     1  ?Was there an injury with Fall?     0  ?Fall Risk Category Calculator     2  ?Fall Risk Category     Moderate  ?Patient Fall Risk Level High fall risk Moderate fall risk Moderate fall risk  Moderate fall risk Moderate fall risk  ?Patient at Risk for Falls Due to     History of fall(s)  ?Fall risk Follow up     Falls evaluation completed;Education provided;Falls prevention discussed  ? ?Functional St

## 2021-06-18 DIAGNOSIS — F411 Generalized anxiety disorder: Secondary | ICD-10-CM | POA: Insufficient documentation

## 2021-06-18 DIAGNOSIS — I5022 Chronic systolic (congestive) heart failure: Secondary | ICD-10-CM | POA: Insufficient documentation

## 2021-06-18 DIAGNOSIS — N183 Chronic kidney disease, stage 3 unspecified: Secondary | ICD-10-CM | POA: Insufficient documentation

## 2021-06-19 ENCOUNTER — Telehealth: Payer: Self-pay

## 2021-06-19 NOTE — Telephone Encounter (Signed)
Beth with [name] called stating the profolyte dressing is has not come in (expected on Thursday). Eustaquio Maize is planning on using the coban with gauge until the profolyte dressing arrives. ? ?Beth called to let [name] know and states if Webb Silversmith would prefer to have wound dressed another way to return call and advise. ? ? ?

## 2021-06-19 NOTE — Telephone Encounter (Signed)
Okay to use available dressing until profolyte dressing arrives. ?

## 2021-06-20 ENCOUNTER — Telehealth: Payer: Self-pay

## 2021-06-20 ENCOUNTER — Telehealth: Payer: Self-pay | Admitting: Internal Medicine

## 2021-06-20 NOTE — Telephone Encounter (Signed)
Beth, RN w/ [facility] called requesting a verbal order to use an absorption dressing/pad under for patients wound on leg due to oozing. ?Eustaquio Maize also stated that patients heart was 46 on yesterday,06/19/21 and 49 today, 06/20/21. Beth reports she will reach out to patients cardiology office today as well. ?

## 2021-06-20 NOTE — Telephone Encounter (Signed)
May give verbal orders as requested.Notify Cardiologist as stated.  ?

## 2021-06-20 NOTE — Telephone Encounter (Signed)
Spoke to [employer] [facility].She was calling to report patient's pulse has been slow the last 2 days 46,49.B/P normal. Stated he feels ok no dizziness, no lightheadedness.He just feels tired.Advised he is not taking any medications to slow heart rate.Stated they will monitor him and call back to report.Advised I will make Dr.Branch aware. ?

## 2021-06-20 NOTE — Telephone Encounter (Signed)
Beth, from [place] called stating patient HR was 46 yesterday.  She advised him to call her today it was 75.  He has no dizziness or lightheadedness.  He just feels tried. She said he has always been on the low side of normal.  They had increased his fluid pills last week, to get rid of some fluid, but now his HR is low.   ?

## 2021-06-21 ENCOUNTER — Ambulatory Visit: Payer: Self-pay | Admitting: Dermatology

## 2021-06-21 NOTE — Telephone Encounter (Signed)
Called [employer] / [place] and no answer, left message to return call. ?

## 2021-06-21 NOTE — Telephone Encounter (Signed)
Janina Mayo, MD  You; Delma Freeze Lucianne Muss D, LPN 1 hour ago (2:95 AM)  ? ?MB ?Please call them back and state as long as his heart rates can increase with activity, this is ok   ? ? ?Returned call to beth, who states that they need an actual range to go off of for notifying the Dr. For his heart rate. Will forward to Dr. Harl Bowie to get recommendations for a HR range.  ?

## 2021-06-23 DIAGNOSIS — E1122 Type 2 diabetes mellitus with diabetic chronic kidney disease: Secondary | ICD-10-CM

## 2021-06-23 MED ORDER — LANCETS MISC: 1.0000 | 2 refills | Status: DC

## 2021-06-23 NOTE — Telephone Encounter (Signed)
Beth, RN w/ [name]  states that someone advised her yesterday, 06/22/21 of verbal order. ?

## 2021-06-23 NOTE — Telephone Encounter (Signed)
Returned call to [employer] at [place].  ? ?Made her aware of Dr. Nelly Laurence recommendations, and she verbalized understanding  ? ?Janina Mayo, MD  You Yesterday (8:12 AM)  ? ?MB ?As long as his heart rates increase with activity and he is not hypotensive then they don't need to notify me. Can have a threshold of 40 bpm with no increase in heart rate.   ? ?

## 2021-06-26 ENCOUNTER — Telehealth: Payer: Self-pay | Admitting: Internal Medicine

## 2021-06-26 ENCOUNTER — Telehealth: Payer: Self-pay | Admitting: *Deleted

## 2021-06-26 NOTE — Telephone Encounter (Signed)
Called and spoke with patient regarding Dr. Nelly Laurence recommendations. Patient states he call his transport and call back to schedule appointment. Call back number given to patient  ?

## 2021-06-26 NOTE — Telephone Encounter (Signed)
Beth with [facility] stating that patient has alittle weeping on his Right leg now. Nurse is wanting to wrap patient's leg like the Left with a 3 layer compression wrap.  ?Also stated that there are little Vesicles on both legs but not big.  ? ?Requesting orders for a 3 layer Compression Wrap for Right leg.  ?Please Advise.  ?

## 2021-06-26 NOTE — Telephone Encounter (Signed)
I contacted patient as number for Beth, RN was not given to return call.  ?He states that he is losing weight, lost 9 lbs in 1 week; however he is still having swelling in the left leg, SOB and chest pressure when taking a deep breath. He states his kidney doctor did increase the Lasix back to 80 mg in the morning and 40 mg in the evening. He states the left leg is red, and both legs have red bumps on the legs his [place] believes there is some type of skin infection- I advised I would send a message to make Dr.Branch aware and see if she has any other recommendations- he would like for [place] to keep Beth in the loop.  ? ?Thanks! ?

## 2021-06-26 NOTE — Telephone Encounter (Signed)
Called and spoke with patient- patient scheduled for Wednesday 5/10 at 9am with Dr. Harl Bowie which was first available for patient and his transport.  ? ?Advised patient to call back to office with any issues, questions, or concerns. Patient verbalized understanding.  ? ?

## 2021-06-26 NOTE — Telephone Encounter (Signed)
Verbal orders given  

## 2021-06-26 NOTE — Telephone Encounter (Signed)
May apply 3 layer wrap to right leg  ?

## 2021-06-26 NOTE — Telephone Encounter (Signed)
? ?  Beth with [name] said, pt's weight is still going down but have swelling on his left leg. Pt is still complaining SOB and chest pressure when taking a deep breath and lungs staying diminish in the basis  ?

## 2021-06-27 ENCOUNTER — Other Ambulatory Visit: Payer: Self-pay | Admitting: Family

## 2021-06-27 ENCOUNTER — Telehealth: Payer: Self-pay | Admitting: *Deleted

## 2021-06-27 DIAGNOSIS — F411 Generalized anxiety disorder: Secondary | ICD-10-CM

## 2021-06-27 LAB — IRON,TIBC AND FERRITIN PANEL
%SAT: 31
Ferritin: 61
Iron: 93
TIBC: 300
UIBC: 207

## 2021-06-27 LAB — BASIC METABOLIC PANEL
BUN: 20 (ref 4–21)
CO2: 25 — AB (ref 13–22)
Chloride: 103 (ref 99–108)
Creatinine: 1.8 — AB (ref 0.6–1.3)
Glucose: 158
Potassium: 4.7 mEq/L (ref 3.5–5.1)
Sodium: 140 (ref 137–147)

## 2021-06-27 LAB — CBC AND DIFFERENTIAL
HCT: 35 — AB (ref 41–53)
Hemoglobin: 11.7 — AB (ref 13.5–17.5)
Platelets: 169 10*3/uL (ref 150–400)
WBC: 4.8

## 2021-06-27 LAB — VITAMIN D 25 HYDROXY (VIT D DEFICIENCY, FRACTURES): Vit D, 25-Hydroxy: 11.7

## 2021-06-27 LAB — COMPREHENSIVE METABOLIC PANEL
Albumin: 3.8 (ref 3.5–5.0)
Calcium: 8.6 — AB (ref 8.7–10.7)
eGFR: 41

## 2021-06-27 LAB — CBC: RBC: 3.77 — AB (ref 3.87–5.11)

## 2021-06-27 NOTE — Telephone Encounter (Signed)
Noted  

## 2021-06-27 NOTE — Telephone Encounter (Signed)
Patient has request refill on medication Xanax 0.5mg . Patient medication last refilled 05/23/2021. Patient has Non Opioid Contract dated 01/09/2021. Patient has upcoming appointment 06/30/2021. Update Contract added to patient appointment notes. Medication pend and sent to PCP Ngetich, Nelda Bucks, NP . Please Advise.  ?

## 2021-06-27 NOTE — Telephone Encounter (Signed)
Beth with [name] called and stated that patient has small blisters in a line on his Right shin. Concerned that it Abraham Entwistle be shingles and wanted patient to be seen. Also concerns with an Abrasion between toes on Left foot.  ? ?Stated that patient is SOB. ? ? ?I called patient and scheduled an appointment for Friday 5/12. Offered sooner but patient could not make it. Patient does have an appointment with Cardiologist tomorrow to follow up the SOB.  ? ?FYI ?

## 2021-06-27 NOTE — Progress Notes (Signed)
?Cardiology Office Note:   ? ?Date:  06/28/2021  ? ?ID:  Jesse [name], DOB 1950/12/08, MRN [id number] ? ?PCP:  Ngetich, Nelda Bucks, NP ?  ?[facility] Providers ?Cardiologist:  Janina Mayo, MD    ? ?Referring MD: [name], NP  ? ?Chief Complaint  ?Patient presents with  ? Follow-up  ? Edema  ?  Legs, feet, and ankles.  ? Shortness of Breath  ?HFpEF ? ?History of Present Illness:   ? ?[name] is a 71 y.o. male with a hx of CKD3b, obesity,  bed/wheelchair bound,  hypertension, type 2 diabetes mellitus 5.9 %, sp colectomy/ colostomy, iron deficiency anemia on oral iron, OSA on CPAP, HFpEF, morbid obesity in a wheelchair ? ?Per her EMR report. He presented with acute on chronic respiratory failure and noted LE edema. His BNP was 218. Hgb 9.1. He was diuresed. He had a large pleural effusion s/p thora yielding 1.2 L.  Fluid is hazy, cloudy and yellow. Cytology was negative. Had a repeat 2L drained. Non infectious.He was diuresed with IV Lasix he is  17 L negative, weight down 38 pounds. His dry weight is ~ 363 pounds. He takes lasix 40 mg BID. ? ? ?Wt Readings from Last 3 Encounters:  ?06/28/21 (!) 363 lb (164.7 kg)  ?06/16/21 (!) 374 lb (169.6 kg)  ?05/25/21 (!) 370 lb (167.8 kg)  ? ?TC- 94 ?HDL 39 ?LDL 38 ?TSH normal ? ?Crt 2.1 on 05/23/2021 from 1.8-2 ?, K 4.6 ? ?He notes PT looked at his legs prior to the hospital and she instructed him to seek care. This was the first time he was admitted for for decompensated HF. He's been in the hospital. 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  He has a large hernia.He has hx of  CKD3a/b. He was being seen in [place] for fluid retention related to kidney disease. He's been on metolazone and bumex in the past. He self adjusted his diuretics. He does not have scale. He sleeps in a recliner and hoping to trial his bed that's at an incline.  His L>R of his legs He had no DVT. Noted did not want DNR.  He has a living will. His son and wife are  designated medical power of attorney. ? ?His BP meds imdur and valsartan were stopped with soft Bps.  ? ? ? ? ?Interim Hx 5/10: ?[name]'s home health have called in several times since late April. Initially he was noted to have  increased weight gain by 7 pounds as well as SOB. The weight gain was in late April. He was advised to increase his lasix dose to 80 mg BID. Also instructed if SOB is progressive for him he can follow-up. We were then informed that his HR was in the high 40s . He did loose weight. He has described pleuritc chest pain with SOB and chest pain with inspiration. They noted his L leg is swollen with erythema. He saw nephrology and planned for lasix 80 mg in the AM and 40 mg in the PM.  ? ?Today his weight is down 11 pounds. He feels no symptoms with the heart rates. The rates can go down to 40. Saw nephrology and started losartan 25 mg daily. ? ?Cardiology Studies: ?EKG 05/16/2021: junctional rhythm RBBB morphology ?GEX:BMWU quality very challenging to fully assess WMA. EF 60-65%, RV moderately enlarged. Svi 40 cc/m2. Dilate LA. Mean MV gradiient 5 mmHg rate 64 bpm noted has mild-mod MS. Mild  AS. IVC dialted, with RA pressure of 15 mmHg. Diastolgy indeterminate. 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 ?Past Medical History:  ?Diagnosis Date  ? Alcohol use disorder in remission   ? Anxiety and depression   ? At high risk for falls   ? Former cigarette smoker   ? Generalized anxiety disorder   ? Hard of hearing   ? Hyperlipidemia due to type 2 diabetes mellitus ([place])   ? Hypertension   ? Morbid obesity ([place])   ? Neuropathy   ? O2 dependent   ? Risk for falls   ? Sleep apnea   ? Type 2 diabetes mellitus ([place])   ? ? ?Past Surgical History:  ?Procedure Laterality Date  ? APPENDECTOMY  1980  ? IR THORACENTESIS ASP PLEURAL SPACE W/IMG GUIDE  05/18/2021  ? KNEE SURGERY Right   ? ? ?Current Medications: ?Current Meds  ?Medication Sig  ? alfuzosin (UROXATRAL) 10 MG 24 hr tablet Take 10 mg by mouth daily.  ?  ALPRAZolam (XANAX) 0.5 MG tablet TAKE 1 TABLET(0.5 MG) BY MOUTH TWICE DAILY AS NEEDED  ? aspirin EC 81 MG tablet Take 81 mg by mouth daily. Swallow whole.  ? Blood Glucose Monitoring Suppl (ONE TOUCH ULTRA MINI) w/Device KIT Use to test blood sugar once daily. Dx:E11.22  ? cyanocobalamin 1000 MCG tablet Take 1,000 mcg by mouth daily.  ? DULoxetine (CYMBALTA) 60 MG capsule Take 60 mg by mouth daily.  ? empagliflozin (JARDIANCE) 25 MG TABS tablet Take 25 mg by mouth daily.  ? ferrous sulfate 325 (65 FE) MG tablet Take 1 tablet (325 mg total) by mouth daily with breakfast.  ? furosemide (LASIX) 80 MG tablet Take 80 mg by mouth 2 (two) times daily as needed. TAKE 80 MG IN THE MORNING AND 40 MG IN THE AFTERNOON.  ? gabapentin (NEURONTIN) 100 MG capsule Take 1 capsule (100 mg total) by mouth 2 (two) times daily.  ? glucose blood test strip Use to test blood sugar once daily. Dx:E11.22  ? ketoconazole (NIZORAL) 2 % cream Apply 1 application topically as needed for irritation.  ? Lancet Device MISC 1 Device by Does not apply route daily.  ? Lancets MISC 1 Device by Does not apply route as directed. Use as directed.  ? liraglutide (VICTOZA) 18 MG/3ML SOPN Inject 1.8 mg into the skin daily.  ? Loperamide HCl (IMODIUM PO) Take 1 capsule by mouth See admin instructions. Take 1 capsule by mouth about 9 times daily per patient  ? losartan (COZAAR) 25 MG tablet Take 25 mg by mouth daily.  ? NYSTATIN powder Apply 1 application. topically 2 (two) times daily. to affected area  ? Red Yeast Rice Extract (RED YEAST RICE PO) Take by mouth daily.  ? rosuvastatin (CRESTOR) 10 MG tablet Take 1 tablet by mouth daily.  ? topiramate (TOPAMAX) 25 MG tablet Take 25 mg by mouth daily.  ? traMADol (ULTRAM) 50 MG tablet TAKE 1 TABLET(50 MG) BY MOUTH TWICE DAILY AS NEEDED  ? traZODone (DESYREL) 50 MG tablet Take 1 tablet (50 mg total) by mouth at bedtime. (Patient taking differently: Take 100 mg by mouth at bedtime.)  ? TURMERIC PO Take 150 mg by  mouth daily.  ? Vitamin D, Ergocalciferol, (DRISDOL) 1.25 MG (50000 UNIT) CAPS capsule Take 50,000 Units by mouth every 7 (seven) days.  ?  ? ?Allergies:   Tape  ? ?Social History  ? ?Socioeconomic History  ? Marital status: Divorced  ?  Spouse name: Not on  file  ? Number of children: 3  ? Years of education: Not on file  ? Highest education level: Master's degree (e.g., MA, MS, MEng, MEd, MSW, MBA)  ?Occupational History  ? Occupation: Retired  ?Tobacco Use  ? Smoking status: Former  ?  Packs/day: 1.00  ?  Types: Cigarettes  ?  Quit date: 1999  ?  Years since quitting: 24.3  ? Smokeless tobacco: Never  ?Vaping Use  ? Vaping Use: Never used  ?Substance and Sexual Activity  ? Alcohol use: Not Currently  ? Drug use: Never  ? Sexual activity: Not on file  ?Other Topics Concern  ? Not on file  ?Social History Narrative  ? Tobacco use, amount per day now: 0  ? Past tobacco use, amount per day: 1 pack  ? How many years did you use tobacco: 10 last 1999  ? Alcohol use (drinks per week): 1/5 day last 1999  ? Diet:  ? Do you drink/eat things with caffeine: Yes  ? Marital status:   Divorced                               What year were you married? 1980  ? Do you live in a house, apartment, assisted living, condo, trailer, etc.?   ? Is it one or more stories? 1  ? How many persons live in your home? 1  ? Do you have pets in your home?( please list) Cat  ? Highest Level of education completed? Post Grad  ? Current or past profession: Insurance account manager.  ? Do you exercise? Little                                 Type and how often?  ? Do you have a living will? Yes  ? Do you have a DNR form?        No                           If not, do you want to discuss one?  ? Do you have signed POA/HPOA forms?   Yes                     If so, please bring to you appointment  ?   ? Do you have any difficulty bathing or dressing yourself? Yes  ? Do you have any difficulty preparing food or eating? No  ? Do you have any difficulty managing your  medications? No  ? Do you have any difficulty managing your finances? No  ? Do you have any difficulty affording your medications?  No  ? ?Social Determinants of Health  ? ?Financial Resource Strain: Low

## 2021-06-28 ENCOUNTER — Other Ambulatory Visit: Payer: Self-pay | Admitting: Family

## 2021-06-28 ENCOUNTER — Encounter: Payer: Self-pay | Admitting: Internal Medicine

## 2021-06-28 ENCOUNTER — Ambulatory Visit: Payer: Medicare PPO | Admitting: Internal Medicine

## 2021-06-28 ENCOUNTER — Ambulatory Visit (INDEPENDENT_AMBULATORY_CARE_PROVIDER_SITE_OTHER): Payer: Medicare PPO

## 2021-06-28 VITALS — BP 122/60 | HR 58 | Ht 72.0 in | Wt 363.0 lb

## 2021-06-28 DIAGNOSIS — I5022 Chronic systolic (congestive) heart failure: Secondary | ICD-10-CM

## 2021-06-28 DIAGNOSIS — R001 Bradycardia, unspecified: Secondary | ICD-10-CM

## 2021-06-28 MED ORDER — FUROSEMIDE 80 MG PO TABS: ORAL_TABLET | ORAL | 3 refills | Status: DC

## 2021-06-28 NOTE — Patient Instructions (Addendum)
Medication Instructions:  ?Your Physician recommend you continue on your current medication as directed.   ? ?*If you need a refill on your cardiac medications before your next appointment, please call your pharmacy* ? ? ?Testing/Procedures: ?ZIO XT- Long Term Monitor Instructions ? ?Your physician has requested you wear a ZIO patch monitor for 7 days.  ?This is a single patch monitor. [employer] supplies one patch monitor per enrollment. Additional ?stickers are not available. Please do not apply patch if you will be having a Nuclear Stress Test,  ?Echocardiogram, Cardiac CT, MRI, or Chest Xray during the period you would be wearing the  ?monitor. The patch cannot be worn during these tests. You cannot remove and re-apply the  ?ZIO XT patch monitor.  ?Your ZIO patch monitor will be mailed 3 day USPS to your address on file. It may take 3-5 days  ?to receive your monitor after you have been enrolled.  ?Once you have received your monitor, please review the enclosed instructions. Your monitor  ?has already been registered assigning a specific monitor serial # to you. ? ?Billing and Patient Assistance Program Information ? ?We have supplied [employer] with any of your insurance information on file for billing purposes. ?[employer] offers a sliding scale Patient Assistance Program for patients that do not have  ?insurance, or whose insurance does not completely cover the cost of the ZIO monitor.  ?You must apply for the Patient Assistance Program to qualify for this discounted rate.  ?To apply, please call [employer] at [phone], select option 4, select option 2, ask to apply for  ?Patient Assistance Program. Theodore Demark will ask your household income, and how many people  ?are in your household. They will quote your out-of-pocket cost based on that information.  ?[employer] will also be able to set up a 52-month, interest-free payment plan if needed. ? ?Applying the monitor ?  ?Shave hair from upper left chest.  ?Hold abrader disc  by orange tab. Rub abrader in 40 strokes over the upper left chest as  ?indicated in your monitor instructions.  ?Clean area with 4 enclosed alcohol pads. Let dry.  ?Apply patch as indicated in monitor instructions. Patch will be placed under collarbone on left  ?side of chest with arrow pointing upward.  ?Rub patch adhesive wings for 2 minutes. Remove white label marked "1". Remove the white  ?label marked "2". Rub patch adhesive wings for 2 additional minutes.  ?While looking in a mirror, press and release button in center of patch. A small green light will  ?flash 3-4 times. This will be your only indicator that the monitor has been turned on.  ?Do not shower for the first 24 hours. You may shower after the first 24 hours.  ?Press the button if you feel a symptom. You will hear a small click. Record Date, Time and  ?Symptom in the Patient Logbook.  ?When you are ready to remove the patch, follow instructions on the last 2 pages of Patient  ?Logbook. Stick patch monitor onto the last page of Patient Logbook.  ?Place Patient Logbook in the blue and white box. Use locking tab on box and tape box closed  ?securely. The blue and white box has prepaid postage on it. Please place it in the mailbox as  ?soon as possible. Your physician should have your test results approximately 7 days after the  ?monitor has been mailed back to [facility].  ?Call [facility] at [phone] if you have questions regarding  ?your ZIO  XT patch monitor. Call them immediately if you see an orange light blinking on your  ?monitor.  ?If your monitor falls off in less than 4 days, contact our Monitor department at [phone].  ?If your monitor becomes loose or falls off after 4 days call [employer] at [phone] for  ?suggestions on securing your monitor ? ? ?Follow-Up: ?At [facility], you and your health needs are our priority.  As part of our continuing mission to provide you with exceptional heart care, we have  created designated Provider Care Teams.  These Care Teams include your primary Cardiologist (physician) and Advanced Practice Providers (APPs -  Physician Assistants and Nurse Practitioners) who all work together to provide you with the care you need, when you need it. ? ?We recommend signing up for the patient portal called "MyChart".  Sign up information is provided on this After Visit Summary.  MyChart is used to connect with patients for Virtual Visits (Telemedicine).  Patients are able to view lab/test results, encounter notes, upcoming appointments, etc.  Non-urgent messages can be sent to your provider as well.   ?To learn more about what you can do with MyChart, go to [email or web address].   ? ?Your next appointment:   ? Thursday December 28, 2021 at 11 am ? ?The format for your next appointment:   ?In Person ? ?Provider:   ?Janina Mayo, MD  ? ? ? ?

## 2021-06-28 NOTE — Telephone Encounter (Signed)
Patient has request refill on medication Victoza. Medication added to medication list 12/08/2020. Medication pend and sent to PCP Ngetich, Nelda Bucks, NP.  ?

## 2021-06-28 NOTE — Progress Notes (Unsigned)
Enrolled for [employer] to mail a ZIO XT long term holter monitor to the patients address on file.  

## 2021-06-28 NOTE — Telephone Encounter (Signed)
Will refill Furosemide as requested. ?

## 2021-06-29 NOTE — Patient Instructions (Addendum)
Please contact your local pharmacy, previous provider, or insurance carrier for vaccine/immunization records. Ensure that any procedures done outside of [facility] are faxed to [place] [phone] or you can sign release of records form at the front desk to keep your medical record updated.   ? ?- Apply lymphedema  ?

## 2021-06-30 ENCOUNTER — Encounter: Payer: Self-pay | Admitting: Family

## 2021-06-30 ENCOUNTER — Ambulatory Visit (INDEPENDENT_AMBULATORY_CARE_PROVIDER_SITE_OTHER): Payer: Medicare PPO | Admitting: Family

## 2021-06-30 VITALS — BP 136/74 | HR 57 | Temp 97.4°F | Resp 18 | Ht 72.0 in

## 2021-06-30 DIAGNOSIS — I5022 Chronic systolic (congestive) heart failure: Secondary | ICD-10-CM | POA: Diagnosis not present

## 2021-06-30 DIAGNOSIS — E1142 Type 2 diabetes mellitus with diabetic polyneuropathy: Secondary | ICD-10-CM | POA: Diagnosis not present

## 2021-06-30 DIAGNOSIS — R001 Bradycardia, unspecified: Secondary | ICD-10-CM | POA: Diagnosis not present

## 2021-06-30 DIAGNOSIS — R21 Rash and other nonspecific skin eruption: Secondary | ICD-10-CM | POA: Diagnosis not present

## 2021-06-30 DIAGNOSIS — Z789 Other specified health status: Secondary | ICD-10-CM

## 2021-06-30 DIAGNOSIS — Z7189 Other specified counseling: Secondary | ICD-10-CM

## 2021-06-30 DIAGNOSIS — S61412A Laceration without foreign body of left hand, initial encounter: Secondary | ICD-10-CM

## 2021-06-30 MED ORDER — TRIAMCINOLONE ACETONIDE 0.1 % EX CREA: 1.0000 "application " | TOPICAL_CREAM | Freq: Two times a day (BID) | CUTANEOUS | 0 refills | Status: DC

## 2021-06-30 MED ORDER — FUROSEMIDE 80 MG PO TABS: ORAL_TABLET | ORAL | 3 refills | Status: DC

## 2021-06-30 MED ORDER — GABAPENTIN 100 MG PO CAPS: 100.0000 mg | ORAL_CAPSULE | Freq: Two times a day (BID) | ORAL | 0 refills | Status: DC

## 2021-06-30 NOTE — Progress Notes (Signed)
? ?Provider: Marlowe Sax FNP-C ? ?Josely Moffat, Nelda Bucks, NP ? ?Patient Care Team: ?Guilianna Mckoy, Nelda Bucks, NP as PCP - General (Family Medicine) ?Janina Mayo, MD as PCP - Cardiology (Cardiology) ? ?Extended Emergency Contact Information ?Primary Emergency Contact: Rosano,MICHAELINE W ?Address: [address]         [place], [place] [address] [place] ?Mobile Phone: [phone] ?Relation: Friend ?Secondary Emergency Contact: Aldous,[name] ?Mobile Phone: [phone] ?Relation: Daughter ?Preferred language: English ?Interpreter needed? No ? ?Code Status:  DNR ?Goals of care: Advanced Directive information ? ?  06/30/2021  ?  3:09 PM  ?Advanced Directives  ?Does Patient Have a Medical Advance Directive? Yes  ?Type of Paramedic of [place];Living will  ?Does patient want to make changes to medical advance directive? No - Patient declined  ?Copy of [place] in Chart? Yes - validated most recent copy scanned in chart (See row information)  ? ? ? ?Chief Complaint  ?Patient presents with  ? Acute Visit  ?  Patient complains of possible shingles on Right leg & Abrasion between toes on Left foot.   ? Concern  ?  Patient got caught in between the door coming into office. Left hand got scraped and is swollen, was bleeding, and some bruising.   ? ? ?HPI:  ?Pt is a 71 y.o. male seen today for an acute visit for evaluation of bilateral lower extremity rash unclear for how long.  States patient has skimmed to wrap legs but noticed rash on shin areas.  Water that might be shingles.He denies any vesicles, burning or pain on the rash areas but states rash is itchy.  Also denies any fever or chills. ?Of note, home health nurse called office to notify provider that patient and 3-layer was unable to stay keeps falling down.  ?Discussed with patient to apply his own previous lymphedema wraps. ?Also states he sustained a skin tear while coming into the building for his visit.  Stated he  grabbed the door but did not realize he is power wheelchair was still on so the chair moved forward making him scrape his left hand on the wall and the door sustaining a skin tear.Skin tear was cleansed by staff and bandage applied. ? ?Goals of care discussed with patient and ex-wife.patient would like CPR attempted in case of cardiopulmonary event.He would also like to additional intervention including admission to Hospital,ICU,intubation and placed on ventilator if indicated.HCPOA who is ex-wife to make decision how long he should be on ventilator \he would also like antibiotics to be administered in a case of infection.Also administered I.V fluids if dehydrated but if unable to eat or swallow he would like feeding tube for trial period of time.Goals of care discussed ith patient and HCPOA from 15: 25 - 16: 20 pm.Questions answered to the best of my knowledge. MOST for printed and give to patient.Instructed to display on refrigerator or head of the bed where it can be visualized by EMS.  ? ?Past Medical History:  ?Diagnosis Date  ? Alcohol use disorder in remission   ? Anxiety and depression   ? At high risk for falls   ? Former cigarette smoker   ? Generalized anxiety disorder   ? Hard of hearing   ? Hyperlipidemia due to type 2 diabetes mellitus ([place])   ? Hypertension   ? Morbid obesity ([place])   ? Neuropathy   ? O2 dependent   ? Risk for falls   ? Sleep  apnea   ? Type 2 diabetes mellitus ([place])   ? ?Past Surgical History:  ?Procedure Laterality Date  ? APPENDECTOMY  1980  ? IR THORACENTESIS ASP PLEURAL SPACE W/IMG GUIDE  05/18/2021  ? KNEE SURGERY Right   ? ? ?Allergies  ?Allergen Reactions  ? Tape Rash  ? ? ?Outpatient Encounter Medications as of 06/30/2021  ?Medication Sig  ? alfuzosin (UROXATRAL) 10 MG 24 hr tablet Take 10 mg by mouth daily.  ? ALPRAZolam (XANAX) 0.5 MG tablet TAKE 1 TABLET(0.5 MG) BY MOUTH TWICE DAILY AS NEEDED  ? aspirin EC 81 MG tablet Take 81 mg by mouth daily. Swallow whole.  ? Blood Glucose  Monitoring Suppl (ONE TOUCH ULTRA MINI) w/Device KIT Use to test blood sugar once daily. Dx:E11.22  ? cyanocobalamin 1000 MCG tablet Take 1,000 mcg by mouth daily.  ? DULoxetine (CYMBALTA) 60 MG capsule Take 60 mg by mouth daily.  ? empagliflozin (JARDIANCE) 25 MG TABS tablet Take 25 mg by mouth daily.  ? ferrous sulfate 325 (65 FE) MG tablet Take 1 tablet (325 mg total) by mouth daily with breakfast.  ? glucose blood test strip Use to test blood sugar once daily. Dx:E11.22  ? ketoconazole (NIZORAL) 2 % cream Apply 1 application topically as needed for irritation.  ? Lancet Device MISC 1 Device by Does not apply route daily.  ? Lancets MISC 1 Device by Does not apply route as directed. Use as directed.  ? Loperamide HCl (IMODIUM PO) Take 1 capsule by mouth See admin instructions. Take 1 capsule by mouth about 9 times daily per patient  ? losartan (COZAAR) 25 MG tablet Take 25 mg by mouth daily.  ? NYSTATIN powder Apply 1 application. topically 2 (two) times daily. to affected area  ? Red Yeast Rice Extract (RED YEAST RICE PO) Take by mouth daily.  ? rosuvastatin (CRESTOR) 10 MG tablet Take 1 tablet by mouth daily.  ? topiramate (TOPAMAX) 25 MG tablet Take 25 mg by mouth daily.  ? traMADol (ULTRAM) 50 MG tablet TAKE 1 TABLET(50 MG) BY MOUTH TWICE DAILY AS NEEDED  ? traZODone (DESYREL) 50 MG tablet Take 100 mg by mouth at bedtime.  ? triamcinolone cream (KENALOG) 0.1 % Apply 1 application. topically 2 (two) times daily. Apply to affected areas on shin area  ? TURMERIC PO Take 150 mg by mouth daily.  ? VICTOZA 18 MG/3ML SOPN ADMINISTER 1.8 MG UNDER THE SKIN EVERY DAY  ? Vitamin D, Ergocalciferol, (DRISDOL) 1.25 MG (50000 UNIT) CAPS capsule Take 50,000 Units by mouth every 7 (seven) days.  ? [DISCONTINUED] furosemide (LASIX) 80 MG tablet TAKE 80 MG IN THE MORNING AND 40 MG IN THE AFTERNOON.  ? [DISCONTINUED] gabapentin (NEURONTIN) 100 MG capsule Take 1 capsule (100 mg total) by mouth 2 (two) times daily.  ? furosemide  (LASIX) 80 MG tablet TAKE 80 MG IN THE MORNING AND 40 MG IN THE AFTERNOON.  ? gabapentin (NEURONTIN) 100 MG capsule Take 1 capsule (100 mg total) by mouth 2 (two) times daily.  ? [DISCONTINUED] traZODone (DESYREL) 50 MG tablet Take 1 tablet (50 mg total) by mouth at bedtime. (Patient taking differently: Take 100 mg by mouth at bedtime.)  ? ?No facility-administered encounter medications on file as of 06/30/2021.  ? ? ?Review of Systems  ?Constitutional:  Negative for activity change, chills, fatigue and fever.  ?Respiratory:  Negative for cough, chest tightness, shortness of breath and wheezing.   ?Cardiovascular:  Positive for leg swelling. Negative for chest pain  and palpitations.  ?Musculoskeletal:  Positive for arthralgias and gait problem.  ?Skin:  Positive for rash. Negative for color change and pallor.  ?     On both shin areas  ?Hematological:  Does not bruise/bleed easily.  ? ?Immunization History  ?Administered Date(s) Administered  ? Fluad Quad(high Dose 65+) 11/19/2020  ? H1N1 01/24/2008  ? Moderna Sars-Covid-2 Vaccination 04/03/2019, 05/05/2019, 12/20/2019, 05/23/2020  ? Zoster Recombinat (Shingrix) 03/01/2020  ? ?Pertinent  Health Maintenance Due  ?Topic Date Due  ? FOOT EXAM  Never done  ? OPHTHALMOLOGY EXAM  Never done  ? INFLUENZA VACCINE  09/19/2021  ? HEMOGLOBIN A1C  12/13/2021  ? COLONOSCOPY (Pts 45-68yr Insurance coverage will need to be confirmed)  Discontinued  ? ? ?  05/19/2021  ?  9:00 PM 05/20/2021  ?  8:25 AM 05/21/2021  ?  7:45 AM 05/23/2021  ?  9:44 AM 06/30/2021  ?  3:09 PM  ?Fall Risk  ?Falls in the past year?    1 0  ?Was there an injury with Fall?    0 0  ?Fall Risk Category Calculator    2 0  ?Fall Risk Category    Moderate Low  ?Patient Fall Risk Level Moderate fall risk Moderate fall risk Moderate fall risk Moderate fall risk Low fall risk  ?Patient at Risk for Falls Due to    History of fall(s) No Fall Risks  ?Fall risk Follow up    Falls evaluation completed;Education provided;Falls  prevention discussed Falls evaluation completed  ? ?Functional Status Survey: ?  ? ?Vitals:  ? 06/30/21 1503  ?BP: 136/74  ?Pulse: (!) 57  ?Resp: 18  ?Temp: (!) 97.4 ?F (36.3 ?C)  ?SpO2: 97%  ?Height: 6

## 2021-07-04 NOTE — Telephone Encounter (Signed)
Your hemoglobin level has actually improved 11.7 compared to previous which was 10.1  ?CO2 level was slightly high.did you have your oxygen when blood was being drawn?  ?

## 2021-07-17 ENCOUNTER — Telehealth: Payer: Self-pay | Admitting: Physician Assistant

## 2021-07-17 DIAGNOSIS — I442 Atrioventricular block, complete: Secondary | ICD-10-CM

## 2021-07-17 DIAGNOSIS — R001 Bradycardia, unspecified: Secondary | ICD-10-CM

## 2021-07-17 NOTE — Telephone Encounter (Signed)
Paged by rhythm.  Patient with episode of complete heart block on May 15 5:41 AM for 9 seconds at rate of 26 bpm.  Full report to be scanned in system.  Unable to reach patient.

## 2021-07-18 ENCOUNTER — Encounter: Payer: Self-pay | Admitting: Internal Medicine

## 2021-07-18 NOTE — Addendum Note (Signed)
Addended by: Rexanne Mano B on: 07/18/2021 09:53 AM   Modules accepted: Orders

## 2021-07-18 NOTE — Telephone Encounter (Signed)
   Cardiac Monitor Alert  Date of alert:  07/18/2021   Patient Name: Jesse [name]  DOB: December 11, 1950  MRN: [id number]   [place] Cardiologist: Janina Mayo, MD  [facility] EP:  None    Monitor Information: Long Term Monitor [ZioXT]  Reason:  Bradycardia  Ordering provider:  Dr. Harl Bowie   Alert Complete heart block This is the 1st alert for this rhythm.   Next Cardiology Appointment   Date:  09/14/21  Provider:  Dr. Harl Bowie  The patient was contacted today.  He is asymptomatic. Arrhythmia, symptoms and history sent to Dr. Harl Bowie for review.    Meryl Crutch, RN  07/18/2021 9:07 AM

## 2021-07-18 NOTE — Telephone Encounter (Signed)
Spoke with Dr. Harl Bowie- she reviewed the strips- she recommends referral to Electrophysiologist.     Called and spoke with patient regarding results and EP consult. Patient is aware and verbalized understanding.   Advised patient to call back to office with any issues, questions, or concerns. Patient verbalized understanding.

## 2021-07-19 ENCOUNTER — Encounter: Payer: Self-pay | Admitting: Internal Medicine

## 2021-07-27 ENCOUNTER — Other Ambulatory Visit: Payer: Self-pay | Admitting: Family

## 2021-07-27 DIAGNOSIS — E1142 Type 2 diabetes mellitus with diabetic polyneuropathy: Secondary | ICD-10-CM

## 2021-07-27 NOTE — Telephone Encounter (Signed)
Patient has request refill on medications Ferrous Sulfate and Alfuzosin. Medication Ferrous Sulfate has already been refilled. Alfuzosin was added to medication list 05/13/2021 but not refilled by PCP Ngetich, [name] C, NP . Medication pend and sent to Sherrie Mustache, NP. Until I have confirmation of who's covering PCP inbasket. Please Advise.

## 2021-08-01 ENCOUNTER — Encounter: Payer: Self-pay | Admitting: Internal Medicine

## 2021-08-01 LAB — COMPREHENSIVE METABOLIC PANEL
Albumin: 3.9 (ref 3.5–5.0)
Calcium: 8.9 (ref 8.7–10.7)
eGFR: 33

## 2021-08-01 LAB — BASIC METABOLIC PANEL
BUN: 50 — AB (ref 4–21)
CO2: 32 — AB (ref 13–22)
Chloride: 95 — AB (ref 99–108)
Creatinine: 2.1 — AB (ref 0.6–1.3)
Glucose: 213
Potassium: 3.5 mEq/L (ref 3.5–5.1)
Sodium: 136 — AB (ref 137–147)

## 2021-08-03 MED ORDER — TRAZODONE HCL 50 MG PO TABS: 100.0000 mg | ORAL_TABLET | Freq: Every day | ORAL | 3 refills | Status: DC

## 2021-08-03 NOTE — Telephone Encounter (Signed)
Refill was not requested nor Denied.  Pended Rx and sent to [facility] for approval. ([name] out of office)

## 2021-08-14 ENCOUNTER — Ambulatory Visit (INDEPENDENT_AMBULATORY_CARE_PROVIDER_SITE_OTHER): Payer: Medicare PPO | Admitting: Family

## 2021-08-14 ENCOUNTER — Encounter: Payer: Self-pay | Admitting: Family

## 2021-08-14 DIAGNOSIS — I5022 Chronic systolic (congestive) heart failure: Secondary | ICD-10-CM | POA: Diagnosis not present

## 2021-08-14 NOTE — Progress Notes (Addendum)
Provider: Marlowe Sax FNP-C  Dawud Mays, Nelda Bucks, NP  Patient Care Team: Terryl Molinelli, Nelda Bucks, NP as PCP - General (Family Medicine) Janina Mayo, MD as PCP - Cardiology (Cardiology)  Extended Emergency Contact Information Primary Emergency Contact: Schoeller,MICHAELINE W Address: [address]          [place], [place] [address] Johnnette Litter of [place] Phone: [phone] Relation: Friend Secondary Emergency Contact: [place] Mobile Phone: [phone] Relation: Daughter Preferred language: English Interpreter needed? No  Code Status:  Full Code  Goals of care: Advanced Directive information    08/14/2021   10:35 AM  Advanced Directives  Does Patient Have a Medical Advance Directive? Yes  Type of Paramedic of [place];Living will  Does patient want to make changes to medical advance directive? No - Patient declined  Copy of [place] in Chart? Yes - validated most recent copy scanned in chart (See row information)     Chief Complaint  Patient presents with   Medical Management of Chronic Issues    Patient is via telephone here to discuss medications FBS: 139 mg/dl    This service is provided via telemedicine  No vital signs collected/recorded due to the encounter was a telemedicine visit.   Location of patient (ex: home, work):  Home   Patient consents to a telephone visit:  Yes   Location of the provider (ex: office, home):  [place] office   Name of any referring provider:  PCP  Names of all persons participating in the telemedicine service and their role in the encounter:  Jaquez, [place] Aina Rossbach,FNP-C ,Pennie Banter ,CMA   HPI:  Pt is a 71 y.o. male seen today for an acute visit for Furosemide refilled.States dose was adjusted by Nephrologist to 180 mg in the morning and 160 mg tablet in the afternoon which has helped with the swelling on the legs.also denies any cough or  shortness of breath.States weight has been below 345 lbs.No notes for review.discussed with patient to notify Nephrologist for refill. States lab work done recently by nephrologist.  Patient will call to obtain records.   Past Medical History:  Diagnosis Date   Alcohol use disorder in remission    Anxiety and depression    At high risk for falls    Former cigarette smoker    Generalized anxiety disorder    Hard of hearing    Hyperlipidemia due to type 2 diabetes mellitus ([place])    Hypertension    Morbid obesity ([place])    Neuropathy    O2 dependent    Risk for falls    Sleep apnea    Type 2 diabetes mellitus ([place])    Past Surgical History:  Procedure Laterality Date   APPENDECTOMY  1980   IR THORACENTESIS ASP PLEURAL SPACE W/IMG GUIDE  05/18/2021   KNEE SURGERY Right     Allergies  Allergen Reactions   Tape Rash    Outpatient Encounter Medications as of 08/14/2021  Medication Sig   alfuzosin (UROXATRAL) 10 MG 24 hr tablet TAKE 1 TABLET BY MOUTH EVERY DAY   ALPRAZolam (XANAX) 0.5 MG tablet TAKE 1 TABLET(0.5 MG) BY MOUTH TWICE DAILY AS NEEDED   aspirin EC 81 MG tablet Take 81 mg by mouth daily. Swallow whole.   Blood Glucose Monitoring Suppl (ONE TOUCH ULTRA MINI) w/Device KIT Use to test blood sugar once daily. Dx:E11.22   cyanocobalamin 1000 MCG tablet Take 1,000 mcg by mouth daily.  DULoxetine (CYMBALTA) 60 MG capsule Take 60 mg by mouth daily.   ferrous sulfate (FEROSUL) 325 (65 FE) MG tablet TAKE 1 TABLET(325 MG) BY MOUTH DAILY WITH BREAKFAST   furosemide (LASIX) 80 MG tablet TAKE 80 MG IN THE MORNING AND 40 MG IN THE AFTERNOON. (Patient taking differently: TAKE 120 MG IN THE MORNING AND 40 MG IN THE AFTERNOON.)   gabapentin (NEURONTIN) 100 MG capsule TAKE 1 CAPSULE(100 MG) BY MOUTH TWICE DAILY   glucose blood test strip Use to test blood sugar once daily. Dx:E11.22   JARDIANCE 25 MG TABS tablet TAKE 1 TABLET BY MOUTH EVERY DAY   ketoconazole (NIZORAL) 2 % cream Apply 1  application topically as needed for irritation.   Lancet Device MISC 1 Device by Does not apply route daily.   Lancets MISC 1 Device by Does not apply route as directed. Use as directed.   Loperamide HCl (IMODIUM PO) Take 1 capsule by mouth See admin instructions. Take 1 capsule by mouth about 9 times daily per patient   losartan (COZAAR) 25 MG tablet Take 25 mg by mouth daily.   NYSTATIN powder Apply 1 application. topically 2 (two) times daily. to affected area   Red Yeast Rice Extract (RED YEAST RICE PO) Take by mouth daily.   rosuvastatin (CRESTOR) 10 MG tablet Take 1 tablet by mouth daily.   topiramate (TOPAMAX) 25 MG tablet TAKE 1 TABLET BY MOUTH EVERY DAY   traMADol (ULTRAM) 50 MG tablet TAKE 1 TABLET(50 MG) BY MOUTH TWICE DAILY AS NEEDED   traZODone (DESYREL) 50 MG tablet Take 2 tablets (100 mg total) by mouth at bedtime.   triamcinolone cream (KENALOG) 0.1 % Apply 1 application. topically 2 (two) times daily. Apply to affected areas on shin area   TURMERIC PO Take 150 mg by mouth daily.   VICTOZA 18 MG/3ML SOPN ADMINISTER 1.8 MG UNDER THE SKIN EVERY DAY   Vitamin D, Ergocalciferol, (DRISDOL) 1.25 MG (50000 UNIT) CAPS capsule Take 50,000 Units by mouth every 7 (seven) days.   No facility-administered encounter medications on file as of 08/14/2021.    Review of Systems  Constitutional:  Negative for appetite change, chills, fatigue, fever and unexpected weight change.  Respiratory:  Negative for cough, chest tightness, shortness of breath and wheezing.   Cardiovascular:  Positive for leg swelling. Negative for chest pain and palpitations.       States swelling on the legs have improved with higher doses of Lasix  Genitourinary:  Negative for difficulty urinating, dysuria, flank pain, frequency and urgency.  Musculoskeletal:  Positive for arthralgias and gait problem. Negative for back pain, joint swelling, myalgias, neck pain and neck stiffness.  Skin:  Negative for color change,  pallor, rash and wound.    Immunization History  Administered Date(s) Administered   Fluad Quad(high Dose 65+) 11/19/2020   H1N1 01/24/2008   Moderna Sars-Covid-2 Vaccination 04/03/2019, 05/05/2019, 12/20/2019, 05/23/2020   Zoster Recombinat (Shingrix) 03/01/2020   Pertinent  Health Maintenance Due  Topic Date Due   FOOT EXAM  Never done   OPHTHALMOLOGY EXAM  Never done   INFLUENZA VACCINE  09/19/2021   HEMOGLOBIN A1C  12/13/2021   COLONOSCOPY (Pts 45-83yr Insurance coverage will need to be confirmed)  Discontinued      05/19/2021    9:00 PM 05/20/2021    8:25 AM 05/21/2021    7:45 AM 05/23/2021    9:44 AM 06/30/2021    3:09 PM  [place] the past year?  1 0  Was there an injury with Fall?    0 0  Fall Risk Category Calculator    2 0  Fall Risk Category    Moderate Low  Patient Fall Risk Level Moderate fall risk Moderate fall risk Moderate fall risk Moderate fall risk Low fall risk  Patient at Risk for Falls Due to    History of fall(s) No Fall Risks  Fall risk Follow up    Falls evaluation completed;Education provided;Falls prevention discussed Falls evaluation completed   Functional Status Survey:    Vitals:   08/14/21 1355  BP: (!) 119/57  Pulse: 62  Temp: 97.6 F (36.4 C)  Weight: (!) 347 lb (157.4 kg)  Height: 6' (1.829 m)   Body mass index is 47.06 kg/m.  Physical Exam Unable to complete on telephone visit  Labs reviewed: Recent Labs    05/17/21 0356 05/18/21 0552 05/20/21 0353 05/21/21 0357 05/23/21 1540 06/27/21 0000  NA 138   < > 136 138 139 140  K 4.2   < > 4.5 4.5 4.6 4.7  CL 104   < > 103 104 104 103  CO2 27   < > 27 29 29  25*  GLUCOSE 107*   < > 120* 122* 162*  --   BUN 29*   < > 28* 25* 33* 20  CREATININE 1.96*   < > 1.92* 1.86* 2.11* 1.8*  CALCIUM 8.2*   < > 8.4* 8.4* 8.7 8.6*  MG 2.2  --   --   --   --   --    < > = values in this interval not displayed.   Recent Labs    03/10/21 1002 05/12/21 2050 06/13/21 0000  06/27/21 0000  AST 14 13* 15  --   ALT 9 7 10   --   ALKPHOS  --  49 75  --   BILITOT 0.5 0.5  --   --   PROT 6.7 7.0  --   --   ALBUMIN  --  3.8  --  3.8   Recent Labs    03/10/21 1002 05/12/21 1830 05/18/21 0552 05/19/21 0305 05/23/21 1540 06/27/21 0000  WBC 3.4*   < > 4.0 5.0 4.8 4.8  NEUTROABS 1,941  --   --   --  3,134  --   HGB 10.3*   < > 9.1* 9.3* 10.1* 11.7*  HCT 33.7*   < > 29.0* 30.9* 32.1* 35*  MCV 97.4   < > 97.0 99.0 97.3  --   PLT 130*   < > 133* 136* 194 169   < > = values in this interval not displayed.   Lab Results  Component Value Date   TSH 2.02 06/13/2021   Lab Results  Component Value Date   HGBA1C 5.7 06/13/2021   Lab Results  Component Value Date   CHOL 101 06/13/2021   HDL 41 06/13/2021   LDLCALC 42 06/13/2021   TRIG 95 06/13/2021   CHOLHDL 2.4 03/10/2021    Significant Diagnostic Results in last 30 days:  LONG TERM MONITOR (3-14 DAYS)  Result Date: 07/19/2021 Agree with zio read below. 3 triggered events for sinus and Wenckebach.  CHB with junctional escape rhythm. Patch Wear Time:  6 days and 16 hours (2023-05-12T17:27:55-0400 to 2023-05-19T10:19:12-398) Patient had a min HR of 22 bpm, max HR of 176 bpm, and avg HR of 54 bpm. Predominant underlying rhythm was Sinus Rhythm. First Degree AV Block was present. Bundle Branch Block/IVCD was  present. 1 run of Ventricular Tachycardia occurred lasting 4 beats with a max rate of 176 bpm (avg 148 bpm). 1619 episode(s) of AV Block (3rd) occurred, lasting a total of 8 hours 1 min. Second Degree AV Block-Mobitz I (Wenckebach) was present. Wenckebach was detected within +/- 45 seconds of symptomatic patient event(s). Isolated SVEs were rare (<1.0%), and no SVE Couplets or SVE Triplets were present. Isolated VEs were rare (<1.0%, 1005), VE Couplets were rare (<1.0%, 63), and VE Triplets were rare (<1.0%, 2). Ventricular Bigeminy and Trigeminy were present. MD notification criteria for Complete Heart Block  met - report posted prior to notification per account request (PK).   Assessment/Plan  Chronic systolic congestive heart failure ([place]) Reports no signs of fluid overload.  Stated lower extremity edema has improved.  Request furosemide refills though states nephrologist adjusted furosemide to 180 mg in the morning and 160 mg tablet in the afternoon which has helped with the swelling on the legs.  Furosemide dosage seems to be high and no recent lab work though patient states nephrologist did labs already.  Have advised him to call the nephrologist office to verify furosemide dosage and refill.Will have diuretic to be managed by nephrologist to avoid mixup in dosage.  Family/ staff Communication: Reviewed plan of care with patient verbalized understanding  Labs/tests ordered: None   I connected with  Maryellen Pile on 08/20/21 by a Telephone enabled telemedicine application and verified that I am speaking with the correct person using two identifiers.   I discussed the limitations of evaluation and management by telemedicine. The patient expressed understanding and agreed to proceed.  Spent 11 minutes of non-face to face with patient  >50% time spent counseling; reviewing medical record; previous labs; and developing future plan of care.  Next Appointment: As needed if symptoms worsen or fail to improve     [name], NP

## 2021-08-15 ENCOUNTER — Encounter: Payer: Self-pay | Admitting: Family

## 2021-08-23 ENCOUNTER — Other Ambulatory Visit: Payer: Self-pay

## 2021-08-23 ENCOUNTER — Other Ambulatory Visit: Payer: Medicare PPO

## 2021-08-23 ENCOUNTER — Telehealth: Payer: Self-pay

## 2021-08-23 DIAGNOSIS — R300 Dysuria: Secondary | ICD-10-CM

## 2021-08-23 NOTE — Telephone Encounter (Signed)
Patient is able have someone drop off a specimen. Can you order labs that way?

## 2021-08-23 NOTE — Telephone Encounter (Signed)
Lab tech Santiago Glad states that patient brought in urine specimen in empty pill bottle. I called patient and asked him if he could come back into office to give clean catch of urine. Due to pill bottle being contaminated. Patient states that Medical Assistant on the phone told him that it was fine to bring urine specimen into office as collected. Patient states that he informed Medical Assistant that he had clean supplies for urine specimen. Patient states that he put Hydrogen Peroxide in bottle to clean it. Patient states that he is disabled and it takes him 48 hours to schedule transportation. Patient was upset about the unclear communication. Patient states that he will try and get someone to pick up supplies from office tomorrow 08/24/2021 and drop urine specimen back off. Patient placed on SCHEDULE for tomorrow 08/24/2021 as lab visit. Incase someone is asked for order by lab tech. UA added to patient notes for Lab appointment as requested by PCP Ngetich, [name] C, NP. Unable to find specimen supplies up front. 2 Patient labels , 2 bottles, 2 Urine Hats, 2 Wipes, 4 gloves, placed for patient up front as patient requested. Future order for urine culture already placed.

## 2021-08-23 NOTE — Telephone Encounter (Addendum)
Specimen container was not discussed. Patient only mentioned that he had previously collected and had it in refrigerator. I realize I should have confirmed with the patient it was a sterile container.

## 2021-08-23 NOTE — Telephone Encounter (Signed)
Patient states he has a history of CKF #3. Yesterday his urine was the color of a dark tea with no symptoms. He called in to his neph. and was directed to call us. He states it is lightening up some but beginning to have discomfort and urgency before Lasix. Due to it being difficult for him to come into the office he would like something called into his [facility] on [address].

## 2021-08-23 NOTE — Telephone Encounter (Signed)
Label, requisition and specimen bag up front for drop off.

## 2021-08-23 NOTE — Telephone Encounter (Signed)
Urine specimen can be dropped to office.will place orders.

## 2021-08-23 NOTE — Telephone Encounter (Addendum)
Mickey Farber, can you confirm location of supplies that were left up front, as I had looked earlier and was unable to locate them, when Santiago Glad approached [place] about this situation.   Did [name] give the verbal ok for patient to collect sample in a non-sterile container, as this will highly result in a contaminated specimen and require multiple trips to collect properly.  Please clarify

## 2021-08-23 NOTE — Telephone Encounter (Signed)
Please collect urine specimen for Urine analysis and culture and sensitivity prior to prescribing any antibiotics.

## 2021-08-24 ENCOUNTER — Other Ambulatory Visit: Payer: Medicare PPO

## 2021-08-25 ENCOUNTER — Institutional Professional Consult (permissible substitution): Payer: Medicare PPO | Admitting: Internal Medicine

## 2021-08-25 ENCOUNTER — Other Ambulatory Visit: Payer: Medicare PPO

## 2021-08-25 ENCOUNTER — Telehealth: Payer: Self-pay

## 2021-08-25 DIAGNOSIS — R300 Dysuria: Secondary | ICD-10-CM

## 2021-08-25 DIAGNOSIS — N3091 Cystitis, unspecified with hematuria: Secondary | ICD-10-CM

## 2021-08-25 LAB — POCT URINALYSIS DIPSTICK
Bilirubin, UA: NEGATIVE
Glucose, UA: POSITIVE — AB
Ketones, UA: NEGATIVE
Nitrite, UA: POSITIVE
Protein, UA: POSITIVE — AB
Spec Grav, UA: 1.015 (ref 1.010–1.025)
Urobilinogen, UA: 0.2 E.U./dL
pH, UA: 5 (ref 5.0–8.0)

## 2021-08-25 MED ORDER — CIPROFLOXACIN HCL 500 MG PO TABS: 500.0000 mg | ORAL_TABLET | Freq: Two times a day (BID) | ORAL | 0 refills | Status: AC

## 2021-08-25 NOTE — Telephone Encounter (Signed)
Urine dip stick indicates moderate blood,leukocytes and positive for nitrites will send urine for culture.Start on Cipro 500 mg tablet one by mouth twice daily x 7 days.

## 2021-08-25 NOTE — Telephone Encounter (Signed)
Called and discussed with the patient. No further questions.

## 2021-08-25 NOTE — Telephone Encounter (Signed)
Patient is requesting a medication be called in as soon as possible. Patient provided a specimen for culture and dipstick. He states he no longer can see blood but the pain has moved from his penis to his bladder and is very uncomfortable.

## 2021-08-28 LAB — URINE CULTURE
MICRO NUMBER:: 13618944
SPECIMEN QUALITY:: ADEQUATE

## 2021-08-30 ENCOUNTER — Other Ambulatory Visit: Payer: Self-pay | Admitting: Family

## 2021-08-30 DIAGNOSIS — G8929 Other chronic pain: Secondary | ICD-10-CM

## 2021-08-30 DIAGNOSIS — E1142 Type 2 diabetes mellitus with diabetic polyneuropathy: Secondary | ICD-10-CM

## 2021-08-31 ENCOUNTER — Telehealth: Payer: Self-pay | Admitting: *Deleted

## 2021-08-31 NOTE — Telephone Encounter (Signed)
Rx last refill (04/12/21), 60/3 refill. Treatment agreement on file dated 12/08/20. Upcoming appointment on (12/22/21) and updated appointment note.   Please advise.

## 2021-08-31 NOTE — Telephone Encounter (Signed)
Patient called with Concerns from Urine Culture:   1.) Patient stated that today is his last day of the Cipro and is wanting to know if he should have another round. Stated that the symptoms have improved.   2.) Blood sugars have been being high and patient is wanting to know if he should go back on his bedtime insulin. Fasting blood sugar running around 150-180.    Please Advise.

## 2021-08-31 NOTE — Telephone Encounter (Signed)
Patient notified and agreed.  

## 2021-08-31 NOTE — Telephone Encounter (Signed)
-   since symptoms have improved and just completed the antibiotics do recommend retreating for now.  - May restart bedtime insulin and notify provider for any blood sugars less than 75 I suspect blood sugars went up due to recent Urinary tract infection.

## 2021-08-31 NOTE — Telephone Encounter (Signed)
Meant do not need to treat with another round of antibiotic for now.

## 2021-09-01 DIAGNOSIS — R001 Bradycardia, unspecified: Secondary | ICD-10-CM | POA: Insufficient documentation

## 2021-09-04 ENCOUNTER — Encounter: Payer: Self-pay | Admitting: Internal Medicine

## 2021-09-04 ENCOUNTER — Ambulatory Visit: Payer: Medicare PPO | Admitting: Internal Medicine

## 2021-09-04 VITALS — BP 100/66 | HR 58 | Ht 72.0 in

## 2021-09-04 DIAGNOSIS — R001 Bradycardia, unspecified: Secondary | ICD-10-CM

## 2021-09-04 DIAGNOSIS — Z01812 Encounter for preprocedural laboratory examination: Secondary | ICD-10-CM | POA: Diagnosis not present

## 2021-09-04 NOTE — H&P (View-Only) (Signed)
ELECTROPHYSIOLOGY CONSULT NOTE  Patient ID: Jesse [name], MRN: [id number], DOB/AGE: 1950/10/31 71 y.o. Admit date: (Not on file) Date of Consult: 09/04/2021  Primary Physician: [name], NP Primary Cardiologist: MBr     Jesse [name] is a 71 y.o. male who is being seen today for the evaluation of bradycardia at the request of Dr. Harl Bowie.    HPI Jesse [name] is a 71 y.o. male referred following hospitalization 3/23 when he presented with significant volume overload and presumed HFpEF with bradycardia and possible junctional rhythm and first-degree AV block.  He underwent a 60 pound diuresis and thoracentesis.  He has also had renal issues which have challenged diuresis.  He has treated sleep apnea for many many years.  He is now on oxygen because of nocturnal hypoxemia.  He is largely nonambulatory, all of this time is in a wheelchair; he is extremely dyspneic with transfer activities  DATE TEST EF   3/23 Echo   60-65 % Technically difficult  RVE-mild-moderate MS        Date Cr K Hgb  6/23 2.1 3.5 11.7          e event recorder  Past Medical History:  Diagnosis Date   Alcohol use disorder in remission    Anxiety and depression    At high risk for falls    Former cigarette smoker    Generalized anxiety disorder    Hard of hearing    Hyperlipidemia due to type 2 diabetes mellitus ([place])    Hypertension    Morbid obesity ([place])    Neuropathy    O2 dependent    Risk for falls    Sleep apnea    Type 2 diabetes mellitus ([place])       Surgical History:  Past Surgical History:  Procedure Laterality Date   APPENDECTOMY  1980   IR THORACENTESIS ASP PLEURAL SPACE W/IMG GUIDE  05/18/2021   KNEE SURGERY Right      Home Meds: Current Meds  Medication Sig   alfuzosin (UROXATRAL) 10 MG 24 hr tablet TAKE 1 TABLET BY MOUTH EVERY DAY   ALPRAZolam (XANAX) 0.5 MG tablet TAKE 1 TABLET(0.5 MG) BY MOUTH TWICE DAILY AS NEEDED   aspirin EC 81 MG tablet Take  81 mg by mouth daily. Swallow whole.   Blood Glucose Monitoring Suppl (ONE TOUCH ULTRA MINI) w/Device KIT Use to test blood sugar once daily. Dx:E11.22   cyanocobalamin 1000 MCG tablet Take 1,000 mcg by mouth daily.   DULoxetine (CYMBALTA) 60 MG capsule Take 60 mg by mouth daily.   ferrous sulfate (FEROSUL) 325 (65 FE) MG tablet TAKE 1 TABLET(325 MG) BY MOUTH DAILY WITH BREAKFAST   furosemide (LASIX) 80 MG tablet TAKE 80 MG IN THE MORNING AND 40 MG IN THE AFTERNOON. (Patient taking differently: TAKE 120 MG IN THE MORNING AND 40 MG IN THE AFTERNOON.)   gabapentin (NEURONTIN) 100 MG capsule TAKE 1 CAPSULE(100 MG) BY MOUTH TWICE DAILY   glucose blood test strip Use to test blood sugar once daily. Dx:E11.22   JARDIANCE 25 MG TABS tablet TAKE 1 TABLET BY MOUTH EVERY DAY   ketoconazole (NIZORAL) 2 % cream Apply 1 application topically as needed for irritation.   Lancet Device MISC 1 Device by Does not apply route daily.   Lancets MISC 1 Device by Does not apply route as directed. Use as directed.   Loperamide HCl (IMODIUM PO) Take 1 capsule by mouth See admin instructions. Take  1 capsule by mouth about 9 times daily per patient   losartan (COZAAR) 25 MG tablet Take 25 mg by mouth daily.   NYSTATIN powder Apply 1 application. topically 2 (two) times daily. to affected area   Red Yeast Rice Extract (RED YEAST RICE PO) Take by mouth daily.   rosuvastatin (CRESTOR) 10 MG tablet Take 1 tablet by mouth daily.   topiramate (TOPAMAX) 25 MG tablet TAKE 1 TABLET BY MOUTH EVERY DAY   traMADol (ULTRAM) 50 MG tablet TAKE 1 TABLET(50 MG) BY MOUTH TWICE DAILY AS NEEDED   traZODone (DESYREL) 50 MG tablet Take 2 tablets (100 mg total) by mouth at bedtime.   triamcinolone cream (KENALOG) 0.1 % Apply 1 application. topically 2 (two) times daily. Apply to affected areas on shin area   TURMERIC PO Take 150 mg by mouth daily.   VICTOZA 18 MG/3ML SOPN ADMINISTER 1.8 MG UNDER THE SKIN EVERY DAY   Vitamin D, Ergocalciferol,  (DRISDOL) 1.25 MG (50000 UNIT) CAPS capsule Take 50,000 Units by mouth every 7 (seven) days.    Allergies:  Allergies  Allergen Reactions   Tape Rash    Social History   Socioeconomic History   Marital status: Divorced    Spouse name: Not on file   Number of children: 3   Years of education: Not on file   Highest education level: Master's degree (e.g., MA, MS, MEng, MEd, MSW, MBA)  Occupational History   Occupation: Retired  Tobacco Use   Smoking status: Former    Packs/day: 1.00    Types: Cigarettes    Quit date: 1999    Years since quitting: 24.5   Smokeless tobacco: Never  Vaping Use   Vaping Use: Never used  Substance and Sexual Activity   Alcohol use: Not Currently   Drug use: Never   Sexual activity: Not on file  Other Topics Concern   Not on file  Social History Narrative   Tobacco use, amount per day now: 0   Past tobacco use, amount per day: 1 pack   How many years did you use tobacco: 10 last 1999   Alcohol use (drinks per week): 1/5 day last 1999   Diet:   Do you drink/eat things with caffeine: Yes   Marital status:   Divorced                               What year were you married? 1980   Do you live in a house, apartment, assisted living, condo, trailer, etc.?    Is it one or more stories? 1   How many persons live in your home? 1   Do you have pets in your home?( please list) Cat   Highest Level of education completed? Post Grad   Current or past profession: Insurance account manager.   Do you exercise? Little                                 Type and how often?   Do you have a living will? Yes   Do you have a DNR form?        No                           If not, do you want to discuss one?   Do you have signed POA/HPOA  forms?   Yes                     If so, please bring to you appointment      Do you have any difficulty bathing or dressing yourself? Yes   Do you have any difficulty preparing food or eating? No   Do you have any difficulty managing your  medications? No   Do you have any difficulty managing your finances? No   Do you have any difficulty affording your medications?  No   Social Determinants of Health   Financial Resource Strain: Low Risk  (05/15/2021)   Overall Financial Resource Strain (CARDIA)    Difficulty of Paying Living Expenses: Not hard at all  Food Insecurity: No Food Insecurity (05/15/2021)   Hunger Vital Sign    Worried About Running Out of Food in the Last Year: Never true    Ran Out of Food in the Last Year: Never true  Transportation Needs: No Transportation Needs (05/15/2021)   PRAPARE - Hydrologist (Medical): No    Lack of Transportation (Non-Medical): No  Physical Activity: Not on file  Stress: Not on file  Social Connections: Not on file  Intimate Partner Violence: Not on file     Family History  Problem Relation Age of Onset   Diabetes Brother    Bipolar disorder Daughter    Anxiety disorder Son      ROS:  Please see the history of present illness.     All other systems reviewed and negative.    Physical Exam: Blood pressure 100/66, pulse (!) 58, height 6' (1.829 m), SpO2 93 %. General: Well developed, Morbidly obese male in no acute distress. Head: Normocephalic, atraumatic, sclera non-icteric, no xanthomas, nares are without discharge. EENT: normal  Lymph Nodes:  none Neck: Negative for carotid bruits. JVD unable to detect  back:without scoliosis kyphosis  Lungs: Clear bilaterally to auscultation without wheezes, rales, or rhonchi. Breathing is unlabored. Heart: RRR with soft S1 and loud A83/4 systolic murmur . No rubs, or gallops appreciated. Abdomen: Soft, non-tender, non-distended with normoactive bowel sounds. No hepatomegaly. No rebound/guarding. No obvious abdominal masses. Msk:  Strength and tone appear normal for age. Extremities: No clubbing or cyanosis. 1=-2 + edema.  Distal pedal pulses are 2+ and equal bilaterally. Skin: Warm and Dry Neuro: Alert  and oriented X 3. CN III-XII intact Grossly normal sensory and motor function . Psych:  Responds to questions appropriately with a normal affect.        EKG: Probable sinus rhythm at a rate of 60.  With appears to be one-to-one conduction on the left inside of the tracing and based on the prior ECGs a PR interval of about 500 ms and on the right-hand side of the tracing continued sinus rhythm at a similar rate with complete heart block and a junctional escape   Assessment and Plan:   Sinus node dysfunction  First-degree AV block and intermittent second and third-degree AV block junctional escape  Obstructive sleep apnea on CPAP  Renal insufficiency grade 3B  Mitral stenosis?  HFpEF-severe  Pulmonary hypertension  Oxygen dependent lung disease  Morbid obesity  Anemia not microcytic  The patient's event recorder has daytime heart rates in the 40s and 50s with first-degree AV block of 400--500 ms event recorder had daytime heart rates in the 30s as he has recorded them himself.  There is third-degree AV block that occurred at sleeping hours  probably associate with his sleep apnea.  He has marked exercise intolerance and difficulty diuresing with poor renal function which might be augmented by improved cardiac performance with both appropriate sinus rates and AV synchrony.  We could likely accomplish this with pacing;  His oxygen dependent lung disease increases the risks of the procedure; I recommended that he establish with pulmonary here in town.  He also has anemia.  It does not appear to be iron deficient by laboratory assessment.  I would expected his hemoglobin to be in the 16+ range given his hypoxemia.  This needs further elucidation.  It is possible that is related to his renal insufficiency.     Virl Axe

## 2021-09-04 NOTE — Patient Instructions (Signed)
Medication Instructions:  Your physician recommends that you continue on your current medications as directed. Please refer to the Current Medication list given to you today.  *If you need a refill on your cardiac medications before your next appointment, please call your pharmacy*   Lab Work: None ordered.  If you have labs (blood work) drawn today and your tests are completely normal, you will receive your results only by: [place] (if you have MyChart) OR A paper copy in the mail If you have any lab test that is abnormal or we need to change your treatment, we will call you to review the results.   Testing/Procedures: None ordered.    Follow-Up: At [facility], you and your health needs are our priority.  As part of our continuing mission to provide you with exceptional heart care, we have created designated Provider Care Teams.  These Care Teams include your primary Cardiologist (physician) and Advanced Practice Providers (APPs -  Physician Assistants and Nurse Practitioners) who all work together to provide you with the care you need, when you need it.  We recommend signing up for the patient portal called "MyChart".  Sign up information is provided on this After Visit Summary.  MyChart is used to connect with patients for Virtual Visits (Telemedicine).  Patients are able to view lab/test results, encounter notes, upcoming appointments, etc.  Non-urgent messages can be sent to your provider as well.   To learn more about what you can do with MyChart, go to [email or web address].    Your next appointment:   To be scheduled  Important Information About Sugar

## 2021-09-04 NOTE — Progress Notes (Signed)
ELECTROPHYSIOLOGY CONSULT NOTE  Patient ID: Jesse [name], MRN: [id number], DOB/AGE: [birth year] 71 y.o. Admit date: (Not on file) Date of Consult: 09/04/2021  Primary Physician: [name], NP Primary Cardiologist: MBr     Jesse [name] is a 71 y.o. male who is being seen today for the evaluation of bradycardia at the request of Dr. Harl Bowie.    HPI Jesse [name] is a 71 y.o. male referred following hospitalization 3/23 when he presented with significant volume overload and presumed HFpEF with bradycardia and possible junctional rhythm and first-degree AV block.  He underwent a 60 pound diuresis and thoracentesis.  He has also had renal issues which have challenged diuresis.  He has treated sleep apnea for many many years.  He is now on oxygen because of nocturnal hypoxemia.  He is largely nonambulatory, all of this time is in a wheelchair; he is extremely dyspneic with transfer activities  DATE TEST EF   3/23 Echo   60-65 % Technically difficult  RVE-mild-moderate MS        Date Cr K Hgb  6/23 2.1 3.5 11.7          e event recorder  Past Medical History:  Diagnosis Date   Alcohol use disorder in remission    Anxiety and depression    At high risk for falls    Former cigarette smoker    Generalized anxiety disorder    Hard of hearing    Hyperlipidemia due to type 2 diabetes mellitus ([place])    Hypertension    Morbid obesity ([place])    Neuropathy    O2 dependent    Risk for falls    Sleep apnea    Type 2 diabetes mellitus ([place])       Surgical History:  Past Surgical History:  Procedure Laterality Date   APPENDECTOMY  1980   IR THORACENTESIS ASP PLEURAL SPACE W/IMG GUIDE  05/18/2021   KNEE SURGERY Right      Home Meds: Current Meds  Medication Sig   alfuzosin (UROXATRAL) 10 MG 24 hr tablet TAKE 1 TABLET BY MOUTH EVERY DAY   ALPRAZolam (XANAX) 0.5 MG tablet TAKE 1 TABLET(0.5 MG) BY MOUTH TWICE DAILY AS NEEDED   aspirin EC 81 MG tablet Take  81 mg by mouth daily. Swallow whole.   Blood Glucose Monitoring Suppl (ONE TOUCH ULTRA MINI) w/Device KIT Use to test blood sugar once daily. Dx:E11.22   cyanocobalamin 1000 MCG tablet Take 1,000 mcg by mouth daily.   DULoxetine (CYMBALTA) 60 MG capsule Take 60 mg by mouth daily.   ferrous sulfate (FEROSUL) 325 (65 FE) MG tablet TAKE 1 TABLET(325 MG) BY MOUTH DAILY WITH BREAKFAST   furosemide (LASIX) 80 MG tablet TAKE 80 MG IN THE MORNING AND 40 MG IN THE AFTERNOON. (Patient taking differently: TAKE 120 MG IN THE MORNING AND 40 MG IN THE AFTERNOON.)   gabapentin (NEURONTIN) 100 MG capsule TAKE 1 CAPSULE(100 MG) BY MOUTH TWICE DAILY   glucose blood test strip Use to test blood sugar once daily. Dx:E11.22   JARDIANCE 25 MG TABS tablet TAKE 1 TABLET BY MOUTH EVERY DAY   ketoconazole (NIZORAL) 2 % cream Apply 1 application topically as needed for irritation.   Lancet Device MISC 1 Device by Does not apply route daily.   Lancets MISC 1 Device by Does not apply route as directed. Use as directed.   Loperamide HCl (IMODIUM PO) Take 1 capsule by mouth See admin instructions. Take  1 capsule by mouth about 9 times daily per patient   losartan (COZAAR) 25 MG tablet Take 25 mg by mouth daily.   NYSTATIN powder Apply 1 application. topically 2 (two) times daily. to affected area   Red Yeast Rice Extract (RED YEAST RICE PO) Take by mouth daily.   rosuvastatin (CRESTOR) 10 MG tablet Take 1 tablet by mouth daily.   topiramate (TOPAMAX) 25 MG tablet TAKE 1 TABLET BY MOUTH EVERY DAY   traMADol (ULTRAM) 50 MG tablet TAKE 1 TABLET(50 MG) BY MOUTH TWICE DAILY AS NEEDED   traZODone (DESYREL) 50 MG tablet Take 2 tablets (100 mg total) by mouth at bedtime.   triamcinolone cream (KENALOG) 0.1 % Apply 1 application. topically 2 (two) times daily. Apply to affected areas on shin area   TURMERIC PO Take 150 mg by mouth daily.   VICTOZA 18 MG/3ML SOPN ADMINISTER 1.8 MG UNDER THE SKIN EVERY DAY   Vitamin D, Ergocalciferol,  (DRISDOL) 1.25 MG (50000 UNIT) CAPS capsule Take 50,000 Units by mouth every 7 (seven) days.    Allergies:  Allergies  Allergen Reactions   Tape Rash    Social History   Socioeconomic History   Marital status: Divorced    Spouse name: Not on file   Number of children: 3   Years of education: Not on file   Highest education level: Master's degree (e.g., MA, MS, MEng, MEd, MSW, MBA)  Occupational History   Occupation: Retired  Tobacco Use   Smoking status: Former    Packs/day: 1.00    Types: Cigarettes    Quit date: 1999    Years since quitting: 24.5   Smokeless tobacco: Never  Vaping Use   Vaping Use: Never used  Substance and Sexual Activity   Alcohol use: Not Currently   Drug use: Never   Sexual activity: Not on file  Other Topics Concern   Not on file  Social History Narrative   Tobacco use, amount per day now: 0   Past tobacco use, amount per day: 1 pack   How many years did you use tobacco: 10 last 1999   Alcohol use (drinks per week): 1/5 day last 1999   Diet:   Do you drink/eat things with caffeine: Yes   Marital status:   Divorced                               What year were you married? 1980   Do you live in a house, apartment, assisted living, condo, trailer, etc.?    Is it one or more stories? 1   How many persons live in your home? 1   Do you have pets in your home?( please list) Cat   Highest Level of education completed? Post Grad   Current or past profession: Insurance account manager.   Do you exercise? Little                                 Type and how often?   Do you have a living will? Yes   Do you have a DNR form?        No                           If not, do you want to discuss one?   Do you have signed POA/HPOA  forms?   Yes                     If so, please bring to you appointment      Do you have any difficulty bathing or dressing yourself? Yes   Do you have any difficulty preparing food or eating? No   Do you have any difficulty managing your  medications? No   Do you have any difficulty managing your finances? No   Do you have any difficulty affording your medications?  No   Social Determinants of Health   Financial Resource Strain: Low Risk  (05/15/2021)   Overall Financial Resource Strain (CARDIA)    Difficulty of Paying Living Expenses: Not hard at all  Food Insecurity: No Food Insecurity (05/15/2021)   Hunger Vital Sign    Worried About Running Out of Food in the Last Year: Never true    Ran Out of Food in the Last Year: Never true  Transportation Needs: No Transportation Needs (05/15/2021)   PRAPARE - Hydrologist (Medical): No    Lack of Transportation (Non-Medical): No  Physical Activity: Not on file  Stress: Not on file  Social Connections: Not on file  Intimate Partner Violence: Not on file     Family History  Problem Relation Age of Onset   Diabetes Brother    Bipolar disorder Daughter    Anxiety disorder Son      ROS:  Please see the history of present illness.     All other systems reviewed and negative.    Physical Exam: Blood pressure 100/66, pulse (!) 58, height 6' (1.829 m), SpO2 93 %. General: Well developed, Morbidly obese male in no acute distress. Head: Normocephalic, atraumatic, sclera non-icteric, no xanthomas, nares are without discharge. EENT: normal  Lymph Nodes:  none Neck: Negative for carotid bruits. JVD unable to detect  back:without scoliosis kyphosis  Lungs: Clear bilaterally to auscultation without wheezes, rales, or rhonchi. Breathing is unlabored. Heart: RRR with soft S1 and loud V36/1 systolic murmur . No rubs, or gallops appreciated. Abdomen: Soft, non-tender, non-distended with normoactive bowel sounds. No hepatomegaly. No rebound/guarding. No obvious abdominal masses. Msk:  Strength and tone appear normal for age. Extremities: No clubbing or cyanosis. 1=-2 + edema.  Distal pedal pulses are 2+ and equal bilaterally. Skin: Warm and Dry Neuro: Alert  and oriented X 3. CN III-XII intact Grossly normal sensory and motor function . Psych:  Responds to questions appropriately with a normal affect.        EKG: Probable sinus rhythm at a rate of 60.  With appears to be one-to-one conduction on the left inside of the tracing and based on the prior ECGs a PR interval of about 500 ms and on the right-hand side of the tracing continued sinus rhythm at a similar rate with complete heart block and a junctional escape   Assessment and Plan:   Sinus node dysfunction  First-degree AV block and intermittent second and third-degree AV block junctional escape  Obstructive sleep apnea on CPAP  Renal insufficiency grade 3B  Mitral stenosis?  HFpEF-severe  Pulmonary hypertension  Oxygen dependent lung disease  Morbid obesity  Anemia not microcytic  The patient's event recorder has daytime heart rates in the 40s and 50s with first-degree AV block of 400--500 ms event recorder had daytime heart rates in the 30s as he has recorded them himself.  There is third-degree AV block that occurred at sleeping hours  probably associate with his sleep apnea.  He has marked exercise intolerance and difficulty diuresing with poor renal function which might be augmented by improved cardiac performance with both appropriate sinus rates and AV synchrony.  We could likely accomplish this with pacing;  His oxygen dependent lung disease increases the risks of the procedure; I recommended that he establish with pulmonary here in town.  He also has anemia.  It does not appear to be iron deficient by laboratory assessment.  I would expected his hemoglobin to be in the 16+ range given his hypoxemia.  This needs further elucidation.  It is possible that is related to his renal insufficiency.     Virl Axe

## 2021-09-05 ENCOUNTER — Telehealth: Payer: Medicare PPO

## 2021-09-05 NOTE — Telephone Encounter (Signed)
Levemir and Pioglitazone were stopped during hospitalization in June,2023.

## 2021-09-05 NOTE — Telephone Encounter (Signed)
Patient wanted him medication list up dated for upcoming cariology surgery to have pace maker put in in a few weeks. Cardiologist says there are 2 medications that are not on the list that patient says he is taking. So patient wants to clarify if he should still be taking these meds or if that is the reason they are no longer on his med list as both medications were just filled recently as well. Medications are Levamur and Pioglitazone 40 mg? Please let me know how to proceed. Routed to PCP

## 2021-09-06 ENCOUNTER — Telehealth: Payer: Self-pay

## 2021-09-06 NOTE — Telephone Encounter (Signed)
Spoke with Jesse [name] about his medication changes and he is taking 40 units of the Levemir once daily at night. Nothing else follows at this time .routed to PCP

## 2021-09-06 NOTE — Telephone Encounter (Signed)
Noted  

## 2021-09-07 ENCOUNTER — Other Ambulatory Visit: Payer: Medicare PPO

## 2021-09-07 ENCOUNTER — Encounter: Payer: Self-pay | Admitting: Internal Medicine

## 2021-09-08 ENCOUNTER — Other Ambulatory Visit: Payer: Medicare PPO

## 2021-09-08 DIAGNOSIS — Z01812 Encounter for preprocedural laboratory examination: Secondary | ICD-10-CM

## 2021-09-08 DIAGNOSIS — R001 Bradycardia, unspecified: Secondary | ICD-10-CM

## 2021-09-09 LAB — CBC
Hematocrit: 33.4 % — ABNORMAL LOW (ref 37.5–51.0)
Hemoglobin: 10.7 g/dL — ABNORMAL LOW (ref 13.0–17.7)
MCH: 29.9 pg (ref 26.6–33.0)
MCHC: 32 g/dL (ref 31.5–35.7)
MCV: 93 fL (ref 79–97)
Platelets: 221 10*3/uL (ref 150–450)
RBC: 3.58 x10E6/uL — ABNORMAL LOW (ref 4.14–5.80)
RDW: 13.6 % (ref 11.6–15.4)
WBC: 6.2 10*3/uL (ref 3.4–10.8)

## 2021-09-09 LAB — BASIC METABOLIC PANEL
BUN/Creatinine Ratio: 29 — ABNORMAL HIGH (ref 10–24)
BUN: 71 mg/dL — ABNORMAL HIGH (ref 8–27)
CO2: 29 mmol/L (ref 20–29)
Calcium: 8.8 mg/dL (ref 8.6–10.2)
Chloride: 90 mmol/L — ABNORMAL LOW (ref 96–106)
Creatinine, Ser: 2.49 mg/dL — ABNORMAL HIGH (ref 0.76–1.27)
Glucose: 190 mg/dL — ABNORMAL HIGH (ref 70–99)
Potassium: 3.1 mmol/L — ABNORMAL LOW (ref 3.5–5.2)
Sodium: 135 mmol/L (ref 134–144)
eGFR: 27 mL/min/{1.73_m2} — ABNORMAL LOW (ref >59)

## 2021-09-11 ENCOUNTER — Encounter: Payer: Self-pay | Admitting: Internal Medicine

## 2021-09-11 ENCOUNTER — Other Ambulatory Visit: Payer: Self-pay | Admitting: *Deleted

## 2021-09-11 ENCOUNTER — Other Ambulatory Visit: Payer: Medicare PPO

## 2021-09-11 DIAGNOSIS — R300 Dysuria: Secondary | ICD-10-CM

## 2021-09-11 DIAGNOSIS — N3091 Cystitis, unspecified with hematuria: Secondary | ICD-10-CM

## 2021-09-11 LAB — BASIC METABOLIC PANEL
BUN: 68 — AB (ref 4–21)
CO2: 29 — AB (ref 13–22)
Chloride: 95 — AB (ref 99–108)
Creatinine: 2.5 — AB (ref 0.6–1.3)
Glucose: 143
Potassium: 3.7 mEq/L (ref 3.5–5.1)
Sodium: 137 (ref 137–147)

## 2021-09-11 LAB — COMPREHENSIVE METABOLIC PANEL
Albumin: 3.6 (ref 3.5–5.0)
Calcium: 8.6 — AB (ref 8.7–10.7)
eGFR: 27

## 2021-09-11 MED ORDER — INSULIN PEN NEEDLE 32G X 4 MM MISC: 3 refills | Status: DC

## 2021-09-11 NOTE — Telephone Encounter (Signed)
Patient unable to come in for an appointment. Stated that daughter will not get there until 4.   Stated that he does have Clean Catch Specimen cups that he can obtain specimen and drop it off after 4 today to have it sent off for culture. Patient states that he has a Procedure schedule for Friday.   Requesting order to be placed for Urine Culture.  Please Advise.

## 2021-09-11 NOTE — Telephone Encounter (Signed)
Collect clean catch urine specimen for U/A and C/S will have to wait for final urine culture to treat.Also recommend Urologist referral for evaluation of frequent urinary tract infections.

## 2021-09-11 NOTE — Telephone Encounter (Signed)
Patient notified and agreed. Orders and referral placed.

## 2021-09-11 NOTE — Telephone Encounter (Signed)
Patient requested 

## 2021-09-13 ENCOUNTER — Encounter: Payer: Self-pay | Admitting: Podiatry

## 2021-09-13 ENCOUNTER — Telehealth: Payer: Self-pay

## 2021-09-13 ENCOUNTER — Other Ambulatory Visit: Payer: Self-pay

## 2021-09-13 DIAGNOSIS — N3091 Cystitis, unspecified with hematuria: Secondary | ICD-10-CM

## 2021-09-13 DIAGNOSIS — R300 Dysuria: Secondary | ICD-10-CM

## 2021-09-13 LAB — URINALYSIS, ROUTINE W REFLEX MICROSCOPIC
Bilirubin Urine: NEGATIVE
Ketones, ur: NEGATIVE
Nitrite: POSITIVE — AB
Specific Gravity, Urine: 1.013 (ref 1.001–1.035)
pH: <=5 (ref 5.0–8.0)

## 2021-09-13 LAB — URINE CULTURE
MICRO NUMBER:: 13686449
SPECIMEN QUALITY:: ADEQUATE

## 2021-09-13 LAB — MICROSCOPIC MESSAGE

## 2021-09-13 MED ORDER — CIPROFLOXACIN HCL 500 MG PO TABS: 500.0000 mg | ORAL_TABLET | Freq: Two times a day (BID) | ORAL | 0 refills | Status: AC

## 2021-09-13 NOTE — Telephone Encounter (Signed)
Called patient and he stated that he has already spoken with Evie. Confirmed antibiotic were received by pharmacy. Patient is requesting referral to Urologist.   Pended and sent to [facility] for approval.

## 2021-09-13 NOTE — Telephone Encounter (Signed)
I have reviewed your final urine culture results staff will be calling you with the results.

## 2021-09-13 NOTE — Telephone Encounter (Signed)
Urology referral ordered.

## 2021-09-13 NOTE — Telephone Encounter (Signed)
Patient left message o CI voicemail stating that he is still waiting on results of culture. He also stated that he paid for a form to filled out and faxed for transportation 2 weeks ago and it has not been done. Patient would like to know if the form has been sent in.  Message routed to Marlowe Sax, NP

## 2021-09-13 NOTE — Telephone Encounter (Signed)
See urine results note.  Transportation Forms completed several weeks ago.

## 2021-09-14 ENCOUNTER — Ambulatory Visit: Payer: Medicare PPO | Admitting: Internal Medicine

## 2021-09-14 NOTE — Pre-Procedure Instructions (Signed)
Instructed patient on the following items: Arrival time 0730 Nothing to eat or drink after midnight No meds AM of procedure Responsible person to drive you home and stay with you for 24 hrs Wash with special soap night before and morning of procedure  

## 2021-09-14 NOTE — Telephone Encounter (Signed)
Patient called back and I discussed reaults with him and sent medication to pharmacy.

## 2021-09-15 ENCOUNTER — Encounter (HOSPITAL_COMMUNITY): Payer: Self-pay | Admitting: Internal Medicine

## 2021-09-15 ENCOUNTER — Ambulatory Visit (HOSPITAL_COMMUNITY): Payer: Medicare PPO

## 2021-09-15 ENCOUNTER — Encounter: Payer: Self-pay | Admitting: Family

## 2021-09-15 ENCOUNTER — Telehealth: Payer: Self-pay

## 2021-09-15 ENCOUNTER — Encounter (HOSPITAL_COMMUNITY)
Admission: RE | Disposition: A | Discharge status: Home / Self Care | Payer: Self-pay | Source: Home / Self Care | Attending: Internal Medicine

## 2021-09-15 ENCOUNTER — Other Ambulatory Visit: Payer: Self-pay

## 2021-09-15 ENCOUNTER — Ambulatory Visit (HOSPITAL_COMMUNITY)
Admission: RE | Admit: 2021-09-15 | Discharge: 2021-09-16 | Disposition: A | Discharge status: Home / Self Care | Payer: Medicare PPO | Attending: Internal Medicine | Admitting: Internal Medicine

## 2021-09-15 DIAGNOSIS — Z933 Colostomy status: Secondary | ICD-10-CM | POA: Insufficient documentation

## 2021-09-15 DIAGNOSIS — G4733 Obstructive sleep apnea (adult) (pediatric): Secondary | ICD-10-CM | POA: Diagnosis not present

## 2021-09-15 DIAGNOSIS — Z993 Dependence on wheelchair: Secondary | ICD-10-CM | POA: Insufficient documentation

## 2021-09-15 DIAGNOSIS — I5032 Chronic diastolic (congestive) heart failure: Secondary | ICD-10-CM | POA: Insufficient documentation

## 2021-09-15 DIAGNOSIS — Z6841 Body Mass Index (BMI) 40.0 and over, adult: Secondary | ICD-10-CM | POA: Diagnosis not present

## 2021-09-15 DIAGNOSIS — I442 Atrioventricular block, complete: Secondary | ICD-10-CM | POA: Diagnosis present

## 2021-09-15 DIAGNOSIS — N183 Chronic kidney disease, stage 3 unspecified: Secondary | ICD-10-CM | POA: Insufficient documentation

## 2021-09-15 DIAGNOSIS — Z794 Long term (current) use of insulin: Secondary | ICD-10-CM | POA: Diagnosis not present

## 2021-09-15 DIAGNOSIS — R001 Bradycardia, unspecified: Secondary | ICD-10-CM

## 2021-09-15 DIAGNOSIS — Z7985 Long-term (current) use of injectable non-insulin antidiabetic drugs: Secondary | ICD-10-CM | POA: Diagnosis not present

## 2021-09-15 DIAGNOSIS — M6281 Muscle weakness (generalized): Secondary | ICD-10-CM | POA: Insufficient documentation

## 2021-09-15 DIAGNOSIS — I272 Pulmonary hypertension, unspecified: Secondary | ICD-10-CM | POA: Diagnosis not present

## 2021-09-15 DIAGNOSIS — E1122 Type 2 diabetes mellitus with diabetic chronic kidney disease: Secondary | ICD-10-CM | POA: Diagnosis not present

## 2021-09-15 DIAGNOSIS — Z7984 Long term (current) use of oral hypoglycemic drugs: Secondary | ICD-10-CM | POA: Diagnosis not present

## 2021-09-15 DIAGNOSIS — Z9981 Dependence on supplemental oxygen: Secondary | ICD-10-CM | POA: Diagnosis not present

## 2021-09-15 DIAGNOSIS — Z9049 Acquired absence of other specified parts of digestive tract: Secondary | ICD-10-CM | POA: Insufficient documentation

## 2021-09-15 DIAGNOSIS — Z950 Presence of cardiac pacemaker: Secondary | ICD-10-CM | POA: Diagnosis not present

## 2021-09-15 DIAGNOSIS — D649 Anemia, unspecified: Secondary | ICD-10-CM | POA: Insufficient documentation

## 2021-09-15 DIAGNOSIS — I130 Hypertensive heart and chronic kidney disease with heart failure and stage 1 through stage 4 chronic kidney disease, or unspecified chronic kidney disease: Secondary | ICD-10-CM | POA: Diagnosis not present

## 2021-09-15 DIAGNOSIS — E785 Hyperlipidemia, unspecified: Secondary | ICD-10-CM | POA: Insufficient documentation

## 2021-09-15 DIAGNOSIS — I495 Sick sinus syndrome: Secondary | ICD-10-CM | POA: Diagnosis not present

## 2021-09-15 HISTORY — PX: PACEMAKER IMPLANT: EP1218

## 2021-09-15 LAB — BASIC METABOLIC PANEL
Anion gap: 10 (ref 5–15)
BUN: 48 mg/dL — ABNORMAL HIGH (ref 8–23)
CO2: 24 mmol/L (ref 22–32)
Calcium: 8.5 mg/dL — ABNORMAL LOW (ref 8.9–10.3)
Chloride: 103 mmol/L (ref 98–111)
Creatinine, Ser: 2.25 mg/dL — ABNORMAL HIGH (ref 0.61–1.24)
GFR, Estimated: 30 mL/min — ABNORMAL LOW (ref >60)
Glucose, Bld: 135 mg/dL — ABNORMAL HIGH (ref 70–99)
Potassium: 4.2 mmol/L (ref 3.5–5.1)
Sodium: 137 mmol/L (ref 135–145)

## 2021-09-15 LAB — GLUCOSE, CAPILLARY
Glucose-Capillary: 130 mg/dL — ABNORMAL HIGH (ref 70–99)
Glucose-Capillary: 123 mg/dL — ABNORMAL HIGH (ref 70–99)
Glucose-Capillary: 187 mg/dL — ABNORMAL HIGH (ref 70–99)

## 2021-09-15 SURGERY — PACEMAKER IMPLANT
Anesthesia: LOCAL

## 2021-09-15 MED ORDER — ASPIRIN 81 MG PO TBEC
81.0000 mg | DELAYED_RELEASE_TABLET | Freq: Every day | ORAL | Status: DC
  Administered 2021-09-16: 81 mg via ORAL
  Filled 2021-09-15: qty 1

## 2021-09-15 MED ORDER — DULOXETINE HCL 60 MG PO CPEP
60.0000 mg | ORAL_CAPSULE | Freq: Every day | ORAL | Status: DC
  Administered 2021-09-16: 60 mg via ORAL
  Filled 2021-09-15: qty 1

## 2021-09-15 MED ORDER — LIDOCAINE HCL (PF) 1 % IJ SOLN
INTRAMUSCULAR | Status: DC | PRN
  Administered 2021-09-15: 60 mL

## 2021-09-15 MED ORDER — ALFUZOSIN HCL ER 10 MG PO TB24
10.0000 mg | ORAL_TABLET | Freq: Every day | ORAL | Status: DC
  Administered 2021-09-16: 10 mg via ORAL
  Filled 2021-09-15: qty 1

## 2021-09-15 MED ORDER — MIDAZOLAM HCL 5 MG/5ML IJ SOLN
INTRAMUSCULAR | Status: DC | PRN
  Administered 2021-09-15: 1 mg via INTRAVENOUS

## 2021-09-15 MED ORDER — FENTANYL CITRATE (PF) 100 MCG/2ML IJ SOLN
INTRAMUSCULAR | Status: AC
  Filled 2021-09-15: qty 2

## 2021-09-15 MED ORDER — ONDANSETRON HCL 4 MG/2ML IJ SOLN: 4.0000 mg | Freq: Four times a day (QID) | INTRAMUSCULAR | Status: DC | PRN

## 2021-09-15 MED ORDER — FUROSEMIDE 40 MG PO TABS
40.0000 mg | ORAL_TABLET | Freq: Every evening | ORAL | Status: DC
  Administered 2021-09-15: 40 mg via ORAL
  Filled 2021-09-15: qty 1

## 2021-09-15 MED ORDER — SODIUM CHLORIDE 0.9 % IV SOLN: INTRAVENOUS | Status: DC

## 2021-09-15 MED ORDER — LIDOCAINE HCL (PF) 1 % IJ SOLN
INTRAMUSCULAR | Status: AC
  Filled 2021-09-15: qty 60

## 2021-09-15 MED ORDER — TRAMADOL HCL 50 MG PO TABS
50.0000 mg | ORAL_TABLET | Freq: Two times a day (BID) | ORAL | Status: DC
  Administered 2021-09-15 – 2021-09-16 (×2): 50 mg via ORAL
  Filled 2021-09-15 (×2): qty 1

## 2021-09-15 MED ORDER — ONDANSETRON HCL 4 MG/2ML IJ SOLN
4.0000 mg | Freq: Four times a day (QID) | INTRAMUSCULAR | Status: DC | PRN
Start: 2021-09-15 — End: 2021-09-16

## 2021-09-15 MED ORDER — INSULIN DETEMIR 100 UNIT/ML ~~LOC~~ SOLN
40.0000 [IU] | Freq: Every day | SUBCUTANEOUS | Status: DC
Start: 2021-09-15 — End: 2021-09-16
  Administered 2021-09-15: 40 [IU] via SUBCUTANEOUS
  Filled 2021-09-15 (×2): qty 0.4

## 2021-09-15 MED ORDER — ACETAMINOPHEN 325 MG PO TABS
325.0000 mg | ORAL_TABLET | ORAL | Status: DC | PRN
  Administered 2021-09-15 – 2021-09-16 (×2): 650 mg via ORAL
  Filled 2021-09-15 (×2): qty 2

## 2021-09-15 MED ORDER — SODIUM CHLORIDE 0.9 % IV SOLN
INTRAVENOUS | Status: AC
  Filled 2021-09-15: qty 2

## 2021-09-15 MED ORDER — ROSUVASTATIN CALCIUM 5 MG PO TABS
10.0000 mg | ORAL_TABLET | Freq: Every day | ORAL | Status: DC
  Administered 2021-09-15 – 2021-09-16 (×2): 10 mg via ORAL
  Filled 2021-09-15 (×2): qty 2

## 2021-09-15 MED ORDER — FENTANYL CITRATE (PF) 100 MCG/2ML IJ SOLN
INTRAMUSCULAR | Status: DC | PRN
  Administered 2021-09-15: 25 ug via INTRAVENOUS

## 2021-09-15 MED ORDER — FUROSEMIDE 40 MG PO TABS
80.0000 mg | ORAL_TABLET | Freq: Two times a day (BID) | ORAL | Status: DC
Start: 2021-09-15 — End: 2021-09-15

## 2021-09-15 MED ORDER — HEPARIN (PORCINE) IN NACL 1000-0.9 UT/500ML-% IV SOLN
INTRAVENOUS | Status: DC | PRN
  Administered 2021-09-15: 500 mL

## 2021-09-15 MED ORDER — GABAPENTIN 100 MG PO CAPS
100.0000 mg | ORAL_CAPSULE | Freq: Two times a day (BID) | ORAL | Status: DC
  Administered 2021-09-15 – 2021-09-16 (×2): 100 mg via ORAL
  Filled 2021-09-15 (×2): qty 1

## 2021-09-15 MED ORDER — HEPARIN (PORCINE) IN NACL 1000-0.9 UT/500ML-% IV SOLN
INTRAVENOUS | Status: AC
  Filled 2021-09-15: qty 500

## 2021-09-15 MED ORDER — EMPAGLIFLOZIN 25 MG PO TABS
25.0000 mg | ORAL_TABLET | Freq: Every day | ORAL | Status: DC
  Administered 2021-09-15 – 2021-09-16 (×2): 25 mg via ORAL
  Filled 2021-09-15 (×2): qty 1

## 2021-09-15 MED ORDER — TRAZODONE HCL 100 MG PO TABS
100.0000 mg | ORAL_TABLET | Freq: Every day | ORAL | Status: DC
  Administered 2021-09-15: 100 mg via ORAL
  Filled 2021-09-15: qty 1

## 2021-09-15 MED ORDER — SODIUM CHLORIDE 0.9 % IV SOLN
80.0000 mg | INTRAVENOUS | Status: AC
  Administered 2021-09-15: 80 mg

## 2021-09-15 MED ORDER — CEFAZOLIN IN SODIUM CHLORIDE 3-0.9 GM/100ML-% IV SOLN
3.0000 g | INTRAVENOUS | Status: AC
  Administered 2021-09-15: 3 g via INTRAVENOUS
  Filled 2021-09-15 (×2): qty 100

## 2021-09-15 MED ORDER — ALPRAZOLAM 0.25 MG PO TABS
0.5000 mg | ORAL_TABLET | Freq: Once | ORAL | Status: AC
  Administered 2021-09-15: 0.5 mg via ORAL
  Filled 2021-09-15: qty 2

## 2021-09-15 MED ORDER — TOPIRAMATE 25 MG PO TABS
25.0000 mg | ORAL_TABLET | Freq: Every day | ORAL | Status: DC
  Administered 2021-09-16: 25 mg via ORAL
  Filled 2021-09-15: qty 1

## 2021-09-15 MED ORDER — CIPROFLOXACIN HCL 500 MG PO TABS
500.0000 mg | ORAL_TABLET | Freq: Two times a day (BID) | ORAL | Status: DC
  Administered 2021-09-15 – 2021-09-16 (×2): 500 mg via ORAL
  Filled 2021-09-15 (×3): qty 1

## 2021-09-15 MED ORDER — CEFAZOLIN SODIUM-DEXTROSE 2-4 GM/100ML-% IV SOLN
INTRAVENOUS | Status: AC
  Filled 2021-09-15: qty 100

## 2021-09-15 MED ORDER — ALPRAZOLAM 0.5 MG PO TABS
0.5000 mg | ORAL_TABLET | Freq: Two times a day (BID) | ORAL | Status: DC | PRN
Start: 2021-09-15 — End: 2021-09-16
  Administered 2021-09-15: 0.5 mg via ORAL

## 2021-09-15 MED ORDER — MIDAZOLAM HCL 5 MG/5ML IJ SOLN
INTRAMUSCULAR | Status: AC
  Filled 2021-09-15: qty 5

## 2021-09-15 MED ORDER — ACETAMINOPHEN 325 MG PO TABS: 325.0000 mg | ORAL_TABLET | ORAL | Status: DC | PRN

## 2021-09-15 MED ORDER — LIRAGLUTIDE 18 MG/3ML ~~LOC~~ SOPN: 1.8000 mg | PEN_INJECTOR | Freq: Every day | SUBCUTANEOUS | Status: DC

## 2021-09-15 MED ORDER — CHLORHEXIDINE GLUCONATE 4 % EX LIQD
4.0000 | Freq: Once | CUTANEOUS | Status: DC
  Filled 2021-09-15: qty 60

## 2021-09-15 MED ORDER — FUROSEMIDE 40 MG PO TABS
80.0000 mg | ORAL_TABLET | Freq: Every day | ORAL | Status: DC
  Administered 2021-09-16: 80 mg via ORAL
  Filled 2021-09-15: qty 2

## 2021-09-15 SURGICAL SUPPLY — 14 items
ATTRACTOMAT 16X20 MAGNETIC DRP (DRAPES) ×1 IMPLANT
CABLE SURGICAL S-101-97-12 (CABLE) ×3 IMPLANT
CATH RIGHTSITE C315HIS02 (CATHETERS) ×1 IMPLANT
HEMOSTAT SURGICEL 2X4 FIBR (HEMOSTASIS) ×1 IMPLANT
LEAD SELECT SECURE 3830 383069 (Lead) IMPLANT
LEAD TENDRIL MRI 52CM LPA1200M (Lead) ×1 IMPLANT
PACEMAKER ASSURITY DR-RF (Pacemaker) ×1 IMPLANT
PAD DEFIB RADIO PHYSIO CONN (PAD) ×3 IMPLANT
SELECT SECURE 3830 383069 (Lead) ×2 IMPLANT
SHEATH 7FR PRELUDE SNAP 13 (SHEATH) ×1 IMPLANT
SHEATH 8FR PRELUDE SNAP 13 (SHEATH) ×1 IMPLANT
SLITTER 6232ADJ (MISCELLANEOUS) ×1 IMPLANT
TRAY PACEMAKER INSERTION (PACKS) ×3 IMPLANT
WIRE HI TORQ VERSACORE-J 145CM (WIRE) ×1 IMPLANT

## 2021-09-15 NOTE — Discharge Summary (Addendum)
ELECTROPHYSIOLOGY PROCEDURE DISCHARGE SUMMARY    Patient ID: Jesse [name],  MRN: [id number], DOB/AGE: 1950/10/15 71 y.o.  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  Implantation of a St. Jude Dual Chamber PPM on 09/15/2021 by Dr. Graciela Husbands. The patient received a Abbott Assurity [id number] with a Abbott Tendril [id number] right atrial lead and a Medtronic SelectSure [id number] right ventricular lead. There were no immediate post procedure complications.   2.  CXR on 09/16/2021 demonstrated no pneumothorax status post device implantation.    Brief HPI: Jesse [name] is a 71 y.o. male with past medical history of HTN, HLD, Type 2 DM, HFpEF, anemia, OSA, wheelchair-bound status and history of colectomy/colostomy who was referred to electrophysiology in the outpatient setting for consideration of PPM implantation. The patient had sinus node dysfunction and AV block without reversible causes identified.  Risks, benefits, and alternatives to PPM implantation were reviewed with the patient who wished to proceed.   Hospital Course:  The patient was admitted and underwent implantation of a St. Jude dual chamber PPM with details as outlined above. He was monitored on telemetry overnight which demonstrated AV paced rhythm. Left chest was without hematoma or ecchymosis.  The device was interrogated and found to be functioning normally.  CXR was obtained and demonstrated no pneumothorax status post device implantation.  Wound care, arm mobility, and restrictions were reviewed with the patient.  The patient was  examined and considered stable for discharge to home. PT did evaluate the patient prior to discharge and recommended Home Health PT and Case Management assisted with arranging this.    Anticoagulation resumption This patient is not on anticoagulation     Physical Exam: Vitals:   09/16/21 0038 09/16/21 0218 09/16/21 0621 09/16/21 0817  BP: (!) 104/49  106/65 (!) 109/42  Pulse: 70  70 70  Resp: 13  15 13   Temp: 97.7 F (36.5 C)  (!) 97.5 F (36.4 C) 97.6 F (36.4 C)  TempSrc: Oral  Oral Oral  SpO2: 99%  100% 100%  Weight:  (!) 166 kg    Height:        GEN- Obese male appearing in no acute distress.  HEENT: normocephalic, atraumatic; sclera clear, conjunctiva pink; hearing intact; oropharynx clear; neck supple, no JVP Lymph- no cervical lymphadenopathy Lungs- Clear to ausculation bilaterally, normal work of breathing.  No wheezes, rales, rhonchi Heart- Regular rate and rhythm, no murmurs, rubs or gallops, PMI not laterally displaced GI- soft, non-tender, non-distended, bowel sounds present, no hepatosplenomegaly Extremities- no clubbing, cyanosis, or edema; DP/PT/radial pulses 2+ bilaterally Skin- warm and dry, no rash or lesion, left chest without hematoma/ecchymosis Psych- euthymic mood, full affect Neuro- strength and sensation are intact  Labs:   Lab Results  Component Value Date   WBC 6.2 09/08/2021   HGB 10.7 (L) 09/08/2021   HCT 33.4 (L) 09/08/2021   MCV 93 09/08/2021   PLT 221 09/08/2021    Recent Labs  Lab 09/15/21 0753  NA 137  K 4.2  CL 103  CO2 24  BUN 48*  CREATININE 2.25*  CALCIUM 8.5*  GLUCOSE  135*    Discharge Medications:  Allergies as of 09/16/2021       Reactions   Tape Rash        Medication List     TAKE these medications    alfuzosin 10 MG 24 hr tablet Commonly known as: UROXATRAL TAKE 1 TABLET BY MOUTH EVERY DAY   ALPRAZolam 0.5 MG tablet Commonly known as: XANAX TAKE 1 TABLET(0.5 MG) BY MOUTH TWICE DAILY AS NEEDED    aspirin EC 81 MG tablet Take 81 mg by mouth daily. Swallow whole.   ciprofloxacin 500 MG tablet Commonly known as: Cipro Take 1 tablet (500 mg total) by mouth 2 (two) times daily for 7 days.   cyanocobalamin 1000 MCG tablet Take 1,000 mcg by mouth daily.   DRY EYE RELIEF OP Place 1 drop into both eyes daily as needed (dry eyes).   DULoxetine 60 MG capsule Commonly known as: CYMBALTA Take 60 mg by mouth daily.   FeroSul 325 (65 FE) MG tablet Generic drug: ferrous sulfate TAKE 1 TABLET(325 MG) BY MOUTH DAILY WITH BREAKFAST   furosemide 80 MG tablet Commonly known as: LASIX TAKE 80 MG IN THE MORNING AND 40 MG IN THE AFTERNOON. What changed:  how much to take how to take this when to take this additional instructions   gabapentin 100 MG capsule Commonly known as: NEURONTIN TAKE 1 CAPSULE(100 MG) BY MOUTH TWICE DAILY   glucose blood test strip Use to test blood sugar once daily. Dx:E11.22   IMODIUM PO Take 3 mg by mouth 3 (three) times daily with meals.   Insulin Pen Needle 32G X 4 MM Misc Use to inject insulin twice daily. Dx: E11.22   Jardiance 25 MG Tabs tablet Generic drug: empagliflozin TAKE 1 TABLET BY MOUTH EVERY DAY   ketoconazole 2 % cream Commonly known as: NIZORAL Apply 1 application topically as needed for irritation.   Lancet Device Misc 1 Device by Does not apply route daily.   Lancets Misc 1 Device by Does not apply route as directed. Use as directed.   Levemir FlexPen 100 UNIT/ML FlexPen Generic drug: insulin detemir Inject 40 Units into the skin at bedtime.   losartan 25 MG tablet Commonly known as: COZAAR Take 25 mg by mouth daily.   nystatin powder Generic drug: nystatin Apply 1 application  topically daily as needed (Yeast infection to afffected area).   ONE TOUCH ULTRA MINI w/Device Kit Use to test blood sugar once daily. Dx:E11.22   OVER THE COUNTER MEDICATION Apply 1 Application topically every other day. Head and shoulder  medicated shampoo alternate with selsun blue medicated   OXYGEN Inhale 2 L into the lungs continuous.   Red Yeast Rice 600 MG Caps Take 1,200 mg by mouth daily.   rosuvastatin 10 MG tablet Commonly known as: CRESTOR Take 1 tablet by mouth daily.   topiramate 25 MG tablet Commonly known as: TOPAMAX TAKE 1 TABLET BY MOUTH EVERY DAY   traMADol 50 MG tablet Commonly known as: ULTRAM TAKE 1 TABLET(50 MG) BY MOUTH TWICE DAILY AS NEEDED   traZODone 50 MG tablet Commonly known as: DESYREL Take 2 tablets (100 mg total) by mouth at bedtime.   triamcinolone cream 0.1 % Commonly known as: KENALOG Apply 1 application. topically 2 (two) times daily. Apply to affected areas on shin area   TURMERIC PO Take 150 mg by mouth daily.   Victoza 18 MG/3ML Sopn Generic drug: liraglutide ADMINISTER 1.8 MG UNDER THE SKIN EVERY DAY   Vitamin  D (Ergocalciferol) 1.25 MG (50000 UNIT) Caps capsule Commonly known as: DRISDOL Take 50,000 Units by mouth every 7 (seven) days.        Disposition:  Discharge Instructions     Increase activity slowly   Complete by: As directed        Follow-up Information     [facility] Follow up.   Why: on 8/9 at 11:20 for post pacemaker wound check Contact information: [address] [place] [id number] [phone]        Care, [facility] Follow up.   Specialty: Home Health Services Why: for home health services Contact information: [address] [place] [address] [phone]                 Duration of Discharge Encounter: Greater than 30 minutes including physician time.  Signed, Ellsworth Lennox, PA-C  09/16/2021 11:31 AM   I have seen and examined this patient with [place].  Agree with above, note added to reflect my findings.  On exam, RRR,no murmurs.  She is now status post Abbott pacemaker for symptomatic  bradycardia.  Device functioning appropriately.  Chest x-ray and interrogation without issue.  Plan for discharge today with follow-up in device clinic.  Rashae Rother M. Juanna Pudlo MD 09/16/2021 11:47 AM

## 2021-09-15 NOTE — Interval H&P Note (Signed)
History and Physical Interval Note:  09/15/2021 7:48 AM  Jesse [name]  has presented today for surgery, with the diagnosis of Sinus node dysfunction.  The various methods of treatment have been discussed with the patient and family. After consideration of risks, benefits and other options for treatment, the patient has consented to  Procedure(s): PACEMAKER IMPLANT (N/A) as a surgical intervention.  The patient's history has been reviewed, patient examined, no change in status, stable for surgery.  I have reviewed the patient's chart and labs.  Questions were answered to the patient's satisfaction.     Virl Axe The benefits and risks were reviewed including but not limited to death,  perforation, infection, lead dislodgement and device malfunction.  The patient understands agrees and is willing to proceed. Will stop his losartan with worsening renal insufficiency

## 2021-09-15 NOTE — Telephone Encounter (Signed)
Home health Nurse assistant to assist with ADL's

## 2021-09-15 NOTE — Progress Notes (Signed)
Orthopedic Tech Progress Note Patient Details:  Jesse [name] 1950/05/06 [id number]  RN said patient has ARM SLING   Patient ID: Jesse [name], male   DOB: 1951/02/07, 71 y.o.   MRN: [id number]  Jesse [name] 09/15/2021, 5:32 PM

## 2021-09-15 NOTE — Discharge Instructions (Signed)
After Your Pacemaker   You have a St. Jude Pacemaker  ACTIVITY Do not lift your arm above shoulder height for 1 week after your procedure. After 7 days, you may progress as below.  You should remove your sling 24 hours after your procedure, unless otherwise instructed by your provider.     Friday September 22, 2021  Saturday September 23, 2021 Sunday September 24, 2021 Monday September 25, 2021   Do not lift, push, pull, or carry anything over 10 pounds with the affected arm until 6 weeks (Friday October 27, 2021 ) after your procedure.   You may drive AFTER your wound check, unless you have been told otherwise by your provider.   Ask your healthcare provider when you can go back to work   INCISION/Dressing   If large square, outer bandage is left in place, this can be removed after 24 hours from your procedure. Do not remove steri-strips or glue as below.   Monitor your Pacemaker site for redness, swelling, and drainage. Call the device clinic at [phone] if you experience these symptoms or fever/chills.  If your incision is sealed with Steri-strips or staples, you may shower 7 days after your procedure or when told by your provider. Do not remove the steri-strips or let the shower hit directly on your site. You may wash around your site with soap and water.    You may shower 09/22/21.  If you were discharged in a sling, please do not wear this during the day more than 48 hours after your surgery unless otherwise instructed. This may increase the risk of stiffness and soreness in your shoulder.   Avoid lotions, ointments, or perfumes over your incision until it is well-healed.  You may use a hot tub or a pool AFTER your wound check appointment if the incision is completely closed.  Pacemaker Alerts:  Some alerts are vibratory and others beep. These are NOT emergencies. Please call our office to let us know. If this occurs at night or on weekends, it can wait until the next business day. Send a  remote transmission.  If your device is capable of reading fluid status (for heart failure), you will be offered monthly monitoring to review this with you.   DEVICE MANAGEMENT Remote monitoring is used to monitor your pacemaker from home. This monitoring is scheduled every 91 days by our office. It allows [place] to keep an eye on the functioning of your device to ensure it is working properly. You will routinely see your Electrophysiologist annually (more often if necessary).   You should receive your ID card for your new device in 4-8 weeks. Keep this card with you at all times once received. Consider wearing a medical alert bracelet or necklace.  Your Pacemaker may be MRI compatible. This will be discussed at your next office visit/wound check.  You should avoid contact with strong electric or magnetic fields.   Do not use amateur (ham) radio equipment or electric (arc) welding torches. MP3 player headphones with magnets should not be used. Some devices are safe to use if held at least 12 inches (30 cm) from your Pacemaker. These include power tools, lawn mowers, and speakers. If you are unsure if something is safe to use, ask your health care provider.  When using your cell phone, hold it to the ear that is on the opposite side from the Pacemaker. Do not leave your cell phone in a pocket over the Pacemaker.  You may safely use electric  blankets, heating pads, computers, and microwave ovens.  Call the office right away if: You have chest pain. You feel more short of breath than you have felt before. You feel more light-headed than you have felt before. Your incision starts to open up.  This information is not intended to replace advice given to you by your health care provider. Make sure you discuss any questions you have with your health care provider.

## 2021-09-15 NOTE — Telephone Encounter (Signed)
Order faxed to [facility] [phone].

## 2021-09-15 NOTE — Telephone Encounter (Signed)
Patient's daughter Katy Apo called and was wanting to know if he could get home health for the next few weeks due to his restrictions after procedure was done. .Lifting restrictions. Can't lift arm above shoulder for a week and can't carry anything above 10 lbs.for 6 weeks. He can't shower for a week. Would like order sent to [facility].  [phone]  Message routed to Marlowe Sax, NP

## 2021-09-15 NOTE — Progress Notes (Signed)
Dr Caryl Comes plans to admit pt-awaiting bed assignment

## 2021-09-15 NOTE — Progress Notes (Signed)
Late Entry RT note-Patient was set up with a nasal CPAP for his procedure this morning, setting where placed in a auto mode with the Fio2 at 2L.

## 2021-09-16 DIAGNOSIS — I442 Atrioventricular block, complete: Secondary | ICD-10-CM | POA: Diagnosis not present

## 2021-09-16 LAB — GLUCOSE, CAPILLARY: Glucose-Capillary: 80 mg/dL (ref 70–99)

## 2021-09-16 NOTE — Evaluation (Addendum)
Physical Therapy Evaluation Patient Details Name: Jesse [name] MRN: [id number] DOB: 1950/06/14 Today's Date: 09/16/2021  History of Present Illness  Pt adm 7/28 for pacer placement due to bradycardia. PMH - morbid obesity, CKD III, chronic hypoxic resp failure on 2L O2, HTN, colectomy and colostomy, OSA.  Clinical Impression  Pt presents to PT close to baseline with his mobility. Recommend return home with HHPT to address limitations of restricted LUE use. Explained to pt importance of moving LUE below shoulder level for functional activities and to prevent frozen shoulder. Pt reports preference for using sling to remind him. Explained if he uses sling as a reminder to make sure he is frequently removing sling and using LUE below shoulder level. Pt will sleep in lift chair initially instead of bed to allow him to get up easier. Pt verbalizes understanding and is ready for dc from PT standpoint.        Recommendations for follow up therapy are one component of a multi-disciplinary discharge planning process, led by the attending physician.  Recommendations may be updated based on patient status, additional functional criteria and insurance authorization.  Follow Up Recommendations Home health PT      Assistance Recommended at Discharge PRN  Patient can return home with the following  Assist for transportation    Equipment Recommendations None recommended by PT  Recommendations for Other Services       Functional Status Assessment Patient has not had a recent decline in their functional status     Precautions / Restrictions Precautions Precautions: ICD/Pacemaker;Fall      Mobility  Bed Mobility Overal bed mobility: Needs Assistance Bed Mobility: Supine to Sit, Sit to Supine     Supine to sit: Min assist Sit to supine: Supervision   General bed mobility comments: Assist for pt to pull up on my hand to elevate trunk into sitting    Transfers Overall transfer level: Needs  assistance Equipment used: None Transfers: Sit to/from Stand, Bed to chair/wheelchair/BSC Sit to Stand: Min guard Stand pivot transfers: Min guard         General transfer comment: Assist for safety    Ambulation/Gait               General Gait Details: Pt nonambulatory at baseline  Stairs            Wheelchair Mobility    Modified Rankin (Stroke Patients Only)       Balance Overall balance assessment: Needs assistance Sitting-balance support: No upper extremity supported, Feet supported Sitting balance-Leahy Scale: Good     Standing balance support: Single extremity supported, During functional activity Standing balance-Leahy Scale: Poor Standing balance comment: reliant on UE support                             Pertinent Vitals/Pain Pain Assessment Pain Assessment: Faces Faces Pain Scale: Hurts a little bit Pain Location: lt pacer site Pain Descriptors / Indicators: Sore Pain Intervention(s): Limited activity within patient's tolerance    Home Living Family/patient expects to be discharged to:: Private residence Living Arrangements: Alone   Type of Home: Apartment Emergency planning/management officer) Home Access: Level entry       Home Layout: One level Home Equipment: Wheelchair - power Additional Comments: lift chair, bed rail for bed, home O2    Prior Function Prior Level of Function : Independent/Modified Independent             Mobility  Comments: Uses power w/c and is modified independent with transfers       Hand Dominance        Extremity/Trunk Assessment   Upper Extremity Assessment Upper Extremity Assessment: LUE deficits/detail LUE Deficits / Details: Limited by pacer precautions    Lower Extremity Assessment Lower Extremity Assessment: Generalized weakness       Communication   Communication: No difficulties  Cognition Arousal/Alertness: Awake/alert Behavior During Therapy: WFL for tasks  assessed/performed Overall Cognitive Status: Within Functional Limits for tasks assessed                                          General Comments General comments (skin integrity, edema, etc.): VSS    Exercises     Assessment/Plan    PT Assessment All further PT needs can be met in the next venue of care  PT Problem List Decreased strength;Decreased balance;Decreased mobility;Obesity       PT Treatment Interventions      PT Goals (Current goals can be found in the Care Plan section)  Acute Rehab PT Goals PT Goal Formulation: All assessment and education complete, DC therapy    Frequency       Co-evaluation               AM-PAC PT "6 Clicks" Mobility  Outcome Measure Help needed turning from your back to your side while in a flat bed without using bedrails?: None Help needed moving from lying on your back to sitting on the side of a flat bed without using bedrails?: A Little Help needed moving to and from a bed to a chair (including a wheelchair)?: A Little Help needed standing up from a chair using your arms (e.g., wheelchair or bedside chair)?: A Little Help needed to walk in hospital room?: Total Help needed climbing 3-5 steps with a railing? : Total 6 Click Score: 15    End of Session Equipment Utilized During Treatment: Oxygen Activity Tolerance: Patient tolerated treatment well Patient left: in bed;with call bell/phone within reach;with bed alarm set Nurse Communication: Mobility status PT Visit Diagnosis: Muscle weakness (generalized) (M62.81)    Time: 4174-0814 PT Time Calculation (min) (ACUTE ONLY): 28 min   Charges:   PT Evaluation $PT Eval Moderate Complexity: 1 [place] Office [place] 09/16/2021, 9:47 AM

## 2021-09-16 NOTE — TOC Transition Note (Signed)
Transition of Care [facility]) - CM/SW Discharge Note   Patient Details  Name: Jesse [name] MRN: [id number] Date of Birth: 04/12/1950  Transition of [facility]) CM/SW Contact:  Carles Collet, RN Phone Number: 09/16/2021, 11:10 AM   Clinical Narrative:   [name] w patient at bedside, verified he would like [facility] and referral placed and accepted by hospital liaison.  Patient has home O2 and CPAP, with portable O2 for transport at bedside for DC today. His daughter will transport home. No other TOC needs identified for DC    Final next level of care: Home w Home Health Services Barriers to Discharge: No Barriers Identified   Patient Goals and CMS Choice Patient states their goals for this hospitalization and ongoing recovery are:: return home CMS Medicare.gov Compare Post Acute Care list provided to:: Patient Choice offered to / list presented to : Patient  Discharge Placement                       Discharge Plan and Services                DME Arranged: N/A         HH Arranged: PT HH Agency: [place] Date [facility] Agency Contacted: 09/16/21 Time [place]: 1109 Representative spoke with at [place]: [place] Interventions     Readmission Risk Interventions    05/15/2021    2:21 PM  Readmission Risk Prevention Plan  Transportation Screening Complete  PCP or Specialist Appt within 3-5 Days Complete  HRI or [place] Complete  Social Work Consult for [place] Planning/Counseling Complete  Palliative Care Screening Not Applicable  Medication Review Press photographer) Complete

## 2021-09-16 NOTE — Plan of Care (Signed)

## 2021-09-18 ENCOUNTER — Encounter (HOSPITAL_COMMUNITY): Payer: Self-pay | Admitting: Internal Medicine

## 2021-09-19 ENCOUNTER — Telehealth: Payer: Self-pay | Admitting: Internal Medicine

## 2021-09-19 NOTE — Telephone Encounter (Signed)
Pt got a pacemaker and pt states he was told that he can't shower or do certain everyday things. He states that  physical therapist assumed that our office would put in an order for physical therapy and a home health nurse. Please advise.

## 2021-09-19 NOTE — Telephone Encounter (Signed)
Spoke with pt who states [facility] PT referral from hospital was made to [place] office.  He states he received a phone call today from the [place] office and requested that referral be sent to [place] office.  Pt advised it may take a day or so for the [facility] office to contact him re: referral.  Pt also reports he has contact his PCP re: pulmonology referral recommended by Dr Caryl Comes and is waiting to hear back.  Pt advised to contact RN if he needs further assistance.  Pt verbalizes understanding and thanked Therapist, sports for the callback.

## 2021-09-20 ENCOUNTER — Encounter: Payer: Self-pay | Admitting: Family

## 2021-09-20 ENCOUNTER — Telehealth (INDEPENDENT_AMBULATORY_CARE_PROVIDER_SITE_OTHER): Payer: Medicare PPO | Admitting: Family

## 2021-09-20 ENCOUNTER — Telehealth: Payer: Self-pay

## 2021-09-20 DIAGNOSIS — J90 Pleural effusion, not elsewhere classified: Secondary | ICD-10-CM

## 2021-09-20 DIAGNOSIS — I5022 Chronic systolic (congestive) heart failure: Secondary | ICD-10-CM | POA: Diagnosis not present

## 2021-09-20 NOTE — Telephone Encounter (Signed)
Message left on clinical intake voicemail: Patient and daughter is questioning why he has not heard about referral to lung specialist.  Patient states Dr.Klein(cardiologist) told him he should have a lung specialist and he also had a chest xray, ordered by Dr.Klein that is concerning.   I returned call to patient and advised that he needs to have a visit with [name] to discuss the above. Patient questions if his daughter can be in on the call as well. I informed him that would be great and we could do a three-way call.   Patient states his daughter is a occupational therapist and he would need to check her schedule. I informed patient that I will call his daughter to orchestrate appointment and he expressed his gratitude.  Patient provided me with his daughter Lilly's number [phone].  I called Lilly and we scheduled an appointment at 10:20 pm. I called Mr.Egnew back and informed him.

## 2021-09-20 NOTE — Progress Notes (Signed)
This service is provided via telemedicine  No vital signs collected/recorded due to the encounter was a telemedicine visit.   Location of patient (ex: home, work):  Home.  Patient consents to a telephone visit:  Yes  Location of the provider (ex: office, home):  [employer].   Name of any referring provider:  Skylar Flynt, Nelda Bucks, NP   Names of all persons participating in the telemedicine service and their role in the encounter:  Patient, Daughter Lilly, Heriberto Antigua, [place], Lakiyah Arntson, Webb Silversmith, NP.    Time spent on call: 8 minutes spent on the phone with Medical Assistant.     Location:      Place of Service:    Provider: Graclyn Lawther FNP-C  Ardice Boyan, Nelda Bucks, NP  Patient Care Team: Avantae Bither, Nelda Bucks, NP as PCP - General (Family Medicine) Janina Mayo, MD as PCP - Cardiology (Cardiology) Deboraha Sprang, MD as PCP - Electrophysiology (Cardiology)  Extended Emergency Contact Information Primary Emergency Contact: Reuben Likes Address: [address]          [place], [place] [address] Johnnette Litter of [place] Phone: [phone] Relation: Friend Secondary Emergency Contact: Tor[place] Mobile Phone: [phone] Relation: Daughter Preferred language: English Interpreter needed? No  Code Status:  Full Code  Goals of care: Advanced Directive information    09/20/2021   10:27 AM  Advanced Directives  Does Patient Have a Medical Advance Directive? Yes  Type of Paramedic of [place];Living will  Does patient want to make changes to medical advance directive? No - Patient declined  Copy of [place] in Chart? Yes - validated most recent copy scanned in chart (See row information)     Chief Complaint  Patient presents with   Acute Visit    Patient wants referral to pulmonologist.     HPI:  Pt is a 71 y.o. male seen today for an acute visit for evaluation of abnormal chest X-ray.Patient's daughter  Ralph Leyden present during telephone visit. He is status post Pacemaker implant done 09/15/2021 Cardiology Dr.Klein Remo Lipps. States Psychologist, forensic site healing well without any signs of infection. Denies any fever or chills.  Has upcoming appointment with cardiology for follow up. He had a pace maker follow up Chest X-ray to check pacemaker.X-ray results indicated left subclavian pacemaker.  No pneumothorax.  Also noted moderate to large left pleural effusion with left lower lung opacity/atelectasis.  State has started feeling a little bit short of breath compared to when he went to the hospital.  Also lower extremity edema has increased and weight has been 20 pounds weight gain in over 1 month.  He was advised to restart metolazone 3 times per week along with furosemide 80 mg twice daily.  He has not started his metaxalone but plans to restart.  Has been following strict fluid restriction and wears his compression stockings. He requests referral to pulmonary specialist for evaluation of pleural effusion.  Has required thoracentesis previously while he was in the hospital.   Past Medical History:  Diagnosis Date   Alcohol use disorder in remission    Anxiety and depression    At high risk for falls    Former cigarette smoker    Generalized anxiety disorder    Hard of hearing    Hyperlipidemia due to type 2 diabetes mellitus ([facility])    Hypertension    Morbid obesity ([place])    Neuropathy    O2 dependent    Risk for falls  Sleep apnea    Type 2 diabetes mellitus [facility])    Past Surgical History:  Procedure Laterality Date   APPENDECTOMY  1980   IR THORACENTESIS ASP PLEURAL SPACE W/IMG GUIDE  05/18/2021   KNEE SURGERY Right    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   Procedure: PACEMAKER IMPLANT;  Surgeon: Deboraha Sprang, MD;  Location: [facility];  Service: Cardiovascular;  Laterality: N/A;    Allergies  Allergen Reactions   Tape Rash    Outpatient Encounter Medications as of 09/20/2021   Medication Sig   alfuzosin (UROXATRAL) 10 MG 24 hr tablet TAKE 1 TABLET BY MOUTH EVERY DAY   ALPRAZolam (XANAX) 0.5 MG tablet TAKE 1 TABLET(0.5 MG) BY MOUTH TWICE DAILY AS NEEDED   aspirin EC 81 MG tablet Take 81 mg by mouth daily. Swallow whole.   Blood Glucose Monitoring Suppl (ONE TOUCH ULTRA MINI) w/Device KIT Use to test blood sugar once daily. Dx:E11.22   Carboxymethylcellulose Sodium (DRY EYE RELIEF OP) Place 1 drop into both eyes daily as needed (dry eyes).   ciprofloxacin (CIPRO) 500 MG tablet Take 1 tablet (500 mg total) by mouth 2 (two) times daily for 7 days.   cyanocobalamin 1000 MCG tablet Take 1,000 mcg by mouth daily.   DULoxetine (CYMBALTA) 60 MG capsule Take 60 mg by mouth daily.   ferrous sulfate (FEROSUL) 325 (65 FE) MG tablet TAKE 1 TABLET(325 MG) BY MOUTH DAILY WITH BREAKFAST   furosemide (LASIX) 40 MG tablet Take 80 mg by mouth in the morning and at bedtime. 38m in the morning and 812min the afternoon.   gabapentin (NEURONTIN) 100 MG capsule TAKE 1 CAPSULE(100 MG) BY MOUTH TWICE DAILY   glucose blood test strip Use to test blood sugar once daily. Dx:E11.22   insulin detemir (LEVEMIR FLEXPEN) 100 UNIT/ML FlexPen Inject 40 Units into the skin at bedtime.   Insulin Pen Needle 32G X 4 MM MISC Use to inject insulin twice daily. Dx: E11.22   JARDIANCE 25 MG TABS tablet TAKE 1 TABLET BY MOUTH EVERY DAY   ketoconazole (NIZORAL) 2 % cream Apply 1 application topically as needed for irritation.   Lancet Device MISC 1 Device by Does not apply route daily.   Lancets MISC 1 Device by Does not apply route as directed. Use as directed.   Loperamide HCl (IMODIUM PO) Take 3 mg by mouth 3 (three) times daily with meals.   losartan (COZAAR) 25 MG tablet Take 25 mg by mouth daily.   NYSTATIN powder Apply 1 application  topically daily as needed (Yeast infection to afffected area).   OVER THE COUNTER MEDICATION Apply 1 Application topically every other day. Head and shoulder medicated  shampoo alternate with selsun blue medicated   OXYGEN Inhale 2 L into the lungs continuous.   potassium chloride SA (KLOR-CON M) 20 MEQ tablet Take 20 mEq by mouth 2 (two) times daily.   Red Yeast Rice 600 MG CAPS Take 1,200 mg by mouth daily.   rosuvastatin (CRESTOR) 10 MG tablet Take 1 tablet by mouth daily.   topiramate (TOPAMAX) 25 MG tablet TAKE 1 TABLET BY MOUTH EVERY DAY   traMADol (ULTRAM) 50 MG tablet TAKE 1 TABLET(50 MG) BY MOUTH TWICE DAILY AS NEEDED   traZODone (DESYREL) 50 MG tablet Take 2 tablets (100 mg total) by mouth at bedtime.   triamcinolone cream (KENALOG) 0.1 % Apply 1 application. topically 2 (two) times daily. Apply to affected areas on shin area   TURMERIC PO Take 150  mg by mouth daily.   VICTOZA 18 MG/3ML SOPN ADMINISTER 1.8 MG UNDER THE SKIN EVERY DAY   Vitamin D, Ergocalciferol, (DRISDOL) 1.25 MG (50000 UNIT) CAPS capsule Take 50,000 Units by mouth every 7 (seven) days.   [DISCONTINUED] furosemide (LASIX) 80 MG tablet TAKE 80 MG IN THE MORNING AND 40 MG IN THE AFTERNOON.   No facility-administered encounter medications on file as of 09/20/2021.    Review of Systems  Constitutional:  Negative for appetite change, chills, fatigue, fever and unexpected weight change.  Respiratory:  Negative for cough, chest tightness and wheezing.        DOE  Cardiovascular:  Positive for leg swelling. Negative for chest pain and palpitations.       S/p pacemaker placement 09/15/2021   Gastrointestinal:  Negative for abdominal distention, abdominal pain, nausea and vomiting.  Musculoskeletal:  Positive for gait problem. Negative for arthralgias, back pain, joint swelling, myalgias, neck pain and neck stiffness.  Skin:  Negative for color change, pallor and rash.       Pace maker site healing well     Immunization History  Administered Date(s) Administered   Fluad Quad(high Dose 65+) 11/19/2020   H1N1 01/24/2008   Moderna Sars-Covid-2 Vaccination 04/03/2019, 05/05/2019,  12/20/2019, 05/23/2020   Zoster Recombinat (Shingrix) 03/01/2020   Pertinent  Health Maintenance Due  Topic Date Due   FOOT EXAM  Never done   OPHTHALMOLOGY EXAM  Never done   INFLUENZA VACCINE  09/19/2021   HEMOGLOBIN A1C  12/13/2021   COLONOSCOPY (Pts 45-68yr Insurance coverage will need to be confirmed)  Discontinued      05/23/2021    9:44 AM 06/30/2021    3:09 PM 09/15/2021    6:14 PM 09/15/2021    9:45 PM 09/20/2021   10:27 AM  Fall Risk  Falls in the past year? 1 0   0  Was there an injury with Fall? 0 0   0  Fall Risk Category Calculator 2 0   0  Fall Risk Category Moderate Low   Low  Patient Fall Risk Level Moderate fall risk Low fall risk High fall risk High fall risk Low fall risk  Patient at Risk for Falls Due to History of fall(s) No Fall Risks   No Fall Risks  Fall risk Follow up Falls evaluation completed;Education provided;Falls prevention discussed Falls evaluation completed   Falls evaluation completed   Functional Status Survey:    There were no vitals filed for this visit. There is no height or weight on file to calculate BMI. Physical Exam Unable to complete on telephone visit   Labs reviewed: Recent Labs    05/17/21 0356 05/18/21 0552 05/23/21 1540 06/27/21 0000 09/08/21 0910 09/11/21 0000 09/15/21 0753  NA 138   < > 139   < > 135 137 137  K 4.2   < > 4.6   < > 3.1* 3.7 4.2  CL 104   < > 104   < > 90* 95* 103  CO2 27   < > 29   < > 29 29* 24  GLUCOSE 107*   < > 162*  --  190*  --  135*  BUN 29*   < > 33*   < > 71* 68* 48*  CREATININE 1.96*   < > 2.11*   < > 2.49* 2.5* 2.25*  CALCIUM 8.2*   < > 8.7   < > 8.8 8.6* 8.5*  MG 2.2  --   --   --   --   --   --    < > =  values in this interval not displayed.   Recent Labs    03/10/21 1002 05/12/21 2050 05/12/21 2050 06/13/21 0000 06/27/21 0000 08/01/21 0000 09/11/21 0000  AST 14 13*  --  15  --   --   --   ALT 9 7  --  10  --   --   --   ALKPHOS  --  49  --  75  --   --   --   BILITOT 0.5  0.5  --   --   --   --   --   PROT 6.7 7.0  --   --   --   --   --   ALBUMIN  --  3.8   < >  --  3.8 3.9 3.6   < > = values in this interval not displayed.   Recent Labs    03/10/21 1002 05/12/21 1830 05/19/21 0305 05/23/21 1540 06/27/21 0000 09/08/21 0910  WBC 3.4*   < > 5.0 4.8 4.8 6.2  NEUTROABS 1,941  --   --  3,134  --   --   HGB 10.3*   < > 9.3* 10.1* 11.7* 10.7*  HCT 33.7*   < > 30.9* 32.1* 35* 33.4*  MCV 97.4   < > 99.0 97.3  --  93  PLT 130*   < > 136* 194 169 221   < > = values in this interval not displayed.   Lab Results  Component Value Date   TSH 2.02 06/13/2021   Lab Results  Component Value Date   HGBA1C 5.7 06/13/2021   Lab Results  Component Value Date   CHOL 101 06/13/2021   HDL 41 06/13/2021   LDLCALC 42 06/13/2021   TRIG 95 06/13/2021   CHOLHDL 2.4 03/10/2021    Significant Diagnostic Results in last 30 days:  DG Chest 2 View  Result Date: 09/16/2021 CLINICAL DATA:  Status post pacemaker placement. EXAM: CHEST - 2 VIEW COMPARISON:  05/19/2021 and prior studies FINDINGS: Cardiomegaly and mild pulmonary vascular congestion is noted. A LEFT subclavian pacemaker is identified with leads overlying the RIGHT atrium and RIGHT ventricle. There is no evidence of pneumothorax. A moderate to large LEFT pleural effusion with LEFT LOWER lung opacity/atelectasis noted. IMPRESSION: 1. LEFT subclavian pacemaker placement. No pneumothorax. 2. Moderate to large LEFT pleural effusion with LEFT LOWER lung opacity/atelectasis. Electronically Signed   By: Margarette Canada M.D.   On: 09/16/2021 10:17    Assessment/Plan  1. Pleural effusion Checks x-ray done on 09/15/2021 for follow-up pacemaker placement  indicated left subclavian pacemaker. No pneumothorax.Also noted moderate to large left pleural effusion with left lower lung opacity/atelectasis. - Has had some increased dyspnea compared to prior to recent hospitalization with  noted weight gain and some edema on lower  extremities. - Metolazone three times per week was added by Cardiology but has not restarted metolazone. - will send urgent referral to Pulmonology for further evaluation.   2. Chronic systolic congestive heart failure ([place]) Has had progressive weight gain,edema and shortness of breath was advised by Cardiology to restart his Metolazone three times per week.Has not taken medication yet. - advised to restart Metolazone as directed  - continue fluid restriction  - continue on Furosemide 80 mg tablet twice daily  - continue to monitor weight daily  - Notify cardiology if symptoms worsen.   Family/ staff Communication: Reviewed plan of care with patient and daughter Vaughan Basta verbalized understanding  Labs/tests ordered: None   Next  Appointment: Return if symptoms worsen or fail to improve.  I connected with  Maryellen Pile on 09/20/21 by a Telephone enabled telemedicine application and verified that I am speaking with the correct person using two identifiers.   I discussed the limitations of evaluation and management by telemedicine. The patient expressed understanding and agreed to proceed.  Spent 18 minutes of non-face to face with patient  >50% time spent counseling; reviewing medical record; tests; labs; and developing future plan of care.      [name], NP

## 2021-09-25 ENCOUNTER — Telehealth: Payer: Self-pay | Admitting: Internal Medicine

## 2021-09-25 NOTE — Telephone Encounter (Signed)
Patient advised he does need to come into the clinic for his wound check since we have to check his device manually. Voiced understanding and states he will find a ride.

## 2021-09-25 NOTE — Telephone Encounter (Signed)
Pt wants to know if he can cancel his wound check due to lack of transportation and have his nurse from [facility] come and do it from home.

## 2021-09-26 ENCOUNTER — Telehealth: Payer: Self-pay | Admitting: Internal Medicine

## 2021-09-26 NOTE — Telephone Encounter (Signed)
Pt c/o medication issue:  1. Name of Medication: furosemide (LASIX) 40 MG tablet  2. How are you currently taking this medication (dosage and times per day)? Take 80 mg by mouth in the morning and at bedtime. 80mg  in the morning and 80mg  in the afternoon.  3. Are you having a reaction (difficulty breathing--STAT)? no  4. What is your medication issue? Beth from [facility] home [facility] called to clarify home patient is suppose to be taking lasix, as his discharge papers has it patient is to take 80mg  in the morning and 40mg  at night.  Patient has been taking 80mg  both in the morning and at night as he was prescribed. Please advise.

## 2021-09-26 NOTE — Telephone Encounter (Signed)
Left Beth a voicemail that this message will be passed along to Dr. Olin Pia nurse when she returns to the office.

## 2021-09-26 NOTE — Telephone Encounter (Signed)
Spoke with Beth from [facility] and confirmed that we do have the patient listed as taking furosemide 80 mg twice daily. According to notes from his visit with his PCP his nephrologist had increased the dose. They had also recommended metolazone which the patient has not started yet. Eustaquio Maize is going to reach out to nephrology to clarify a couple of other medications as they seem to be making several changes.

## 2021-09-26 NOTE — Telephone Encounter (Signed)
Calling to make nurse aware that she spoke with Kidney doctor and they did not prescribe Metolazone. Please advise

## 2021-09-27 ENCOUNTER — Ambulatory Visit: Payer: Medicare PPO

## 2021-09-27 ENCOUNTER — Ambulatory Visit (INDEPENDENT_AMBULATORY_CARE_PROVIDER_SITE_OTHER): Payer: Medicare PPO

## 2021-09-27 DIAGNOSIS — R001 Bradycardia, unspecified: Secondary | ICD-10-CM

## 2021-09-27 NOTE — Patient Instructions (Signed)
   After Your Pacemaker   Monitor your pacemaker site for redness, swelling, and drainage. Call the device clinic at [phone] if you experience these symptoms or fever/chills.  Your incision was closed with Steri-strips or staples:  You may shower 7 days after your procedure and wash your incision with soap and water. Avoid lotions, ointments, or perfumes over your incision until it is well-healed.  You may use a hot tub or a pool after your wound check appointment if the incision is completely closed.  Do not lift, push or pull greater than 10 pounds with the affected arm until October 27 2021 . There are no other restrictions in arm movement after your wound check appointment.  You may drive, unless driving has been restricted by your healthcare providers.   Remote monitoring is used to monitor your pacemaker from home. This monitoring is scheduled every 91 days by our office. It allows [place] to keep an eye on the functioning of your device to ensure it is working properly. You will routinely see your Electrophysiologist annually (more often if necessary).

## 2021-09-27 NOTE — Progress Notes (Signed)

## 2021-09-28 NOTE — Telephone Encounter (Signed)
Spoke with Fortino Sic nurse, and advised Dr Caryl Comes was not prescriber of Metolazone.  She thanked Therapist, sports for the callback.

## 2021-09-29 ENCOUNTER — Ambulatory Visit: Payer: Medicare PPO | Admitting: Pulmonary Disease

## 2021-09-29 ENCOUNTER — Encounter: Payer: Self-pay | Admitting: Pulmonary Disease

## 2021-09-29 VITALS — BP 128/66 | HR 70 | Temp 98.4°F | Ht 72.0 in | Wt 351.0 lb

## 2021-09-29 DIAGNOSIS — J90 Pleural effusion, not elsewhere classified: Secondary | ICD-10-CM

## 2021-09-29 NOTE — Progress Notes (Signed)
_0  ID: Maryellen Pile, male    DOB: 09/30/1950, 71 y.o.   MRN: [id number]  Chief Complaint  Patient presents with   Consult    Pt is here for consult for pleural effusion. Pt states he was dx with this last week. Heart echo done March 2023. And CT scan in March 2023. Pain in center of chest when he takes a deep breath. Pt is currenlty on 2L via POC. Pt states pressure noted all the time in chest.     Referring provider: Ngetich, Nelda Bucks, NP  HPI:   71 y.o. man whom are seen in consultation for evaluation of left pleural effusion.  Recent PCP note reviewed.  Most recent cardiology note reviewed.  Discharge summary 04/2021 reviewed.  Patient mid to the hospital 04/2021 with hypoxemia tori failure.  And volume overload.  Aggressively diuresed.  Lost 30+ pounds per his report.  All water weight.  Initial chest x-ray on admission showed moderate left pleural effusion.  CT scan obtained that admission confirmed this finding with compressive atelectasis, tiny right pleural effusion.  He underwent thoracentesis x2 at admission.  Fluid is consistent with pseudo exudate given albumin gradient, protein was elevated in the setting of diuretic administration.  He thinks it helped him feel better but he cannot remember to get aggressively diuresed.  He was discharged.  No chest imaging in the interim until he had a pacemaker placement placed 08/2021.  This demonstrated similar left-sided effusion with evidence of loculation given tracking of the lateral wall of the chest on my review and interpretation.  This prompted referral.  Over last few weeks he feels a little bit more winded.  Feels like he cannot get a deep breath or a hollow feeling in the left side of his chest.  Some pressure there as well.  He has not ambulated much recently.  He falls a lot.  He is just recently established with a new physical therapist and trying to get him on his feet more.  He gets around in a wheelchair.  TTE 04/2021  reviewed demonstrated dilated left atrium, moderate mitral valve stenosis, mild aortic stenosis.  PMH: Diastolic congestive heart failure, diabetes Surgical history: Appendectomy, knee surgery Family history: Brother with diabetes, Social history: Never smoker, lives in [place] Family History  Problem Relation Age of Onset   Diabetes Brother    Bipolar disorder Daughter    Anxiety disorder Son     Past Surgical History:  Procedure Laterality Date   APPENDECTOMY  1980   IR THORACENTESIS ASP PLEURAL SPACE W/IMG GUIDE  05/18/2021   KNEE SURGERY Right    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   Procedure: PACEMAKER IMPLANT;  Surgeon: Deboraha Sprang, MD;  Location: [facility];  Service: Cardiovascular;  Laterality: N/A;      Questionaires / Pulmonary Flowsheets:   ACT:      No data to display          MMRC:     No data to display          Epworth:      No data to display          Tests:   FENO:  No results found for: "NITRICOXIDE"  PFT:     No data to display          WALK:      No data to display          Imaging: Personally reviewed and as per EMR  discussion this note DG Chest 2 View  Result Date: 09/16/2021 CLINICAL DATA:  Status post pacemaker placement. EXAM: CHEST - 2 VIEW COMPARISON:  05/19/2021 and prior studies FINDINGS: Cardiomegaly and mild pulmonary vascular congestion is noted. A LEFT subclavian pacemaker is identified with leads overlying the RIGHT atrium and RIGHT ventricle. There is no evidence of pneumothorax. A moderate to large LEFT pleural effusion with LEFT LOWER lung opacity/atelectasis noted. IMPRESSION: 1. LEFT subclavian pacemaker placement. No pneumothorax. 2. Moderate to large LEFT pleural effusion with LEFT LOWER lung opacity/atelectasis. Electronically Signed   By: Margarette Canada M.D.   On: 09/16/2021 10:17    Lab Results: Personally reviewed CBC    Component Value Date/Time   WBC 6.2 09/08/2021 0910   WBC 4.8  05/23/2021 1540   RBC 3.58 (L) 09/08/2021 0910   RBC 3.77 (A) 06/27/2021 0000   HGB 10.7 (L) 09/08/2021 0910   HCT 33.4 (L) 09/08/2021 0910   PLT 221 09/08/2021 0910   MCV 93 09/08/2021 0910   MCH 29.9 09/08/2021 0910   MCH 30.6 05/23/2021 1540   MCHC 32.0 09/08/2021 0910   MCHC 31.5 (L) 05/23/2021 1540   RDW 13.6 09/08/2021 0910   LYMPHSABS 734 (L) 05/23/2021 1540   EOSABS 341 05/23/2021 1540   BASOSABS 38 05/23/2021 1540    BMET    Component Value Date/Time   NA 137 09/15/2021 0753   NA 137 09/11/2021 0000   K 4.2 09/15/2021 0753   CL 103 09/15/2021 0753   CO2 24 09/15/2021 0753   GLUCOSE 135 (H) 09/15/2021 0753   BUN 48 (H) 09/15/2021 0753   BUN 68 (A) 09/11/2021 0000   CREATININE 2.25 (H) 09/15/2021 0753   CREATININE 2.11 (H) 05/23/2021 1540   CALCIUM 8.5 (L) 09/15/2021 0753   GFRNONAA 30 (L) 09/15/2021 0753   GFRAA (L) 04/08/2008 1240    54        The eGFR has been calculated using the MDRD equation. This calculation has not been validated in all clinical situations. eGFR's persistently <60 mL/min signify possible Chronic Kidney Disease.    BNP    Component Value Date/Time   BNP 218.8 (H) 05/12/2021 2001    ProBNP No results found for: "PROBNP"  Specialty Problems       Pulmonary Problems   Chronic respiratory failure with hypoxia (HCC)   OSA (obstructive sleep apnea)    Allergies  Allergen Reactions   Tape Rash    Immunization History  Administered Date(s) Administered   Fluad Quad(high Dose 72+) 11/19/2020   H1N1 01/24/2008   Moderna Sars-Covid-2 Vaccination 04/03/2019, 05/05/2019, 12/20/2019, 05/23/2020   Zoster Recombinat (Shingrix) 03/01/2020    Past Medical History:  Diagnosis Date   Alcohol use disorder in remission    Anxiety and depression    At high risk for falls    Former cigarette smoker    Generalized anxiety disorder    Hard of hearing    Hyperlipidemia due to type 2 diabetes mellitus ([place])    Hypertension     Morbid obesity ([place])    Neuropathy    O2 dependent    Risk for falls    Sleep apnea    Type 2 diabetes mellitus ([place])     Tobacco History: Social History   Tobacco Use  Smoking Status Former   Packs/day: 1.00   Types: Cigarettes   Quit date: 1999   Years since quitting: 24.6  Smokeless Tobacco Never   Counseling given: Not Answered   Continue  to not smoke  Outpatient Encounter Medications as of 09/29/2021  Medication Sig   alfuzosin (UROXATRAL) 10 MG 24 hr tablet TAKE 1 TABLET BY MOUTH EVERY DAY   ALPRAZolam (XANAX) 0.5 MG tablet TAKE 1 TABLET(0.5 MG) BY MOUTH TWICE DAILY AS NEEDED   aspirin EC 81 MG tablet Take 81 mg by mouth daily. Swallow whole.   Blood Glucose Monitoring Suppl (ONE TOUCH ULTRA MINI) w/Device KIT Use to test blood sugar once daily. Dx:E11.22   Carboxymethylcellulose Sodium (DRY EYE RELIEF OP) Place 1 drop into both eyes daily as needed (dry eyes).   cyanocobalamin 1000 MCG tablet Take 1,000 mcg by mouth daily.   DULoxetine (CYMBALTA) 60 MG capsule Take 60 mg by mouth daily.   ferrous sulfate (FEROSUL) 325 (65 FE) MG tablet TAKE 1 TABLET(325 MG) BY MOUTH DAILY WITH BREAKFAST   furosemide (LASIX) 40 MG tablet Take 80 mg by mouth in the morning and at bedtime. 21m in the morning and 883min the afternoon.   gabapentin (NEURONTIN) 100 MG capsule TAKE 1 CAPSULE(100 MG) BY MOUTH TWICE DAILY   glucose blood test strip Use to test blood sugar once daily. Dx:E11.22   insulin detemir (LEVEMIR FLEXPEN) 100 UNIT/ML FlexPen Inject 40 Units into the skin at bedtime.   Insulin Pen Needle 32G X 4 MM MISC Use to inject insulin twice daily. Dx: E11.22   JARDIANCE 25 MG TABS tablet TAKE 1 TABLET BY MOUTH EVERY DAY   ketoconazole (NIZORAL) 2 % cream Apply 1 application topically as needed for irritation.   Lancet Device MISC 1 Device by Does not apply route daily.   Lancets MISC 1 Device by Does not apply route as directed. Use as directed.   Loperamide HCl (IMODIUM PO)  Take 3 mg by mouth 3 (three) times daily with meals.   losartan (COZAAR) 25 MG tablet Take 25 mg by mouth daily.   NYSTATIN powder Apply 1 application  topically daily as needed (Yeast infection to afffected area).   OVER THE COUNTER MEDICATION Apply 1 Application topically every other day. Head and shoulder medicated shampoo alternate with selsun blue medicated   OXYGEN Inhale 2 L into the lungs continuous.   potassium chloride SA (KLOR-CON M) 20 MEQ tablet Take 20 mEq by mouth 2 (two) times daily.   Red Yeast Rice 600 MG CAPS Take 1,200 mg by mouth daily.   rosuvastatin (CRESTOR) 10 MG tablet Take 1 tablet by mouth daily.   topiramate (TOPAMAX) 25 MG tablet TAKE 1 TABLET BY MOUTH EVERY DAY   traMADol (ULTRAM) 50 MG tablet TAKE 1 TABLET(50 MG) BY MOUTH TWICE DAILY AS NEEDED   traZODone (DESYREL) 50 MG tablet Take 2 tablets (100 mg total) by mouth at bedtime.   triamcinolone cream (KENALOG) 0.1 % Apply 1 application. topically 2 (two) times daily. Apply to affected areas on shin area   TURMERIC PO Take 150 mg by mouth daily.   VICTOZA 18 MG/3ML SOPN ADMINISTER 1.8 MG UNDER THE SKIN EVERY DAY   Vitamin D, Ergocalciferol, (DRISDOL) 1.25 MG (50000 UNIT) CAPS capsule Take 50,000 Units by mouth every 7 (seven) days.   No facility-administered encounter medications on file as of 09/29/2021.     Review of Systems  Review of Systems  No chest pain with exertion.  He reports stable orthopnea without worsening orthopnea or PND.  Comprehensive review of systems otherwise negative Physical Exam  BP 128/66 (BP Location: Right Arm, Patient Position: Sitting, Cuff Size: Normal)   Pulse 70  Temp 98.4 F (36.9 C) (Oral)   Ht 6' (1.829 m)   Wt (!) 351 lb (159.2 kg)   SpO2 96%   BMI 47.60 kg/m   Wt Readings from Last 5 Encounters:  09/29/21 (!) 351 lb (159.2 kg)  09/16/21 (!) 365 lb 15.4 oz (166 kg)  08/14/21 (!) 347 lb (157.4 kg)  06/28/21 (!) 363 lb (164.7 kg)  06/16/21 (!) 374 lb (169.6 kg)     BMI Readings from Last 5 Encounters:  09/29/21 47.60 kg/m  09/16/21 50.33 kg/m  09/04/21 47.06 kg/m  08/14/21 47.06 kg/m  06/30/21 49.23 kg/m     Physical Exam General: Obese, chronically ill-appearing Eyes: EOMI, no icterus Neck: Supple, range of motion intact Pulmonary: Clear bilaterally mildly dulled in the left base but breath sounds present, normal work of breathing Cardiovascular: Warm, normal S1 and S2 Abdomen: Nondistended, bowel sounds present MSK: No synovitis, no joint effusion Neuro: In wheelchair, no focal weakness Psych: Normal mood, full affect  Assessment & Plan:   Recurrent/chronic left pleural effusion: First seen March 2023 in the setting of volume overload, diastolic heart failure with acute exacerbation.  Tiny right-sided effusion at that time.  Aggressively diuresed and had thoracentesis x2 performed at admission.  Has recurred with repeat chest x-ray after pacemaker placement 09/15/2021 showing now loculation of this effusion with tracking of the lateral wall.  Do query if this is overestimated on imaging given relative lack of correlate on exam.  He has been more dyspneic, chest tightness.  Transudative in March as was pseudo exudate by protein but albumin gradient indicates transudate, likely in the setting of diuretic administration.  Suspect related to volume overload.  He reports good adherence to his diuretic regimen.  Thoracentesis via IR, order placed today with tentative plan to perform next Friday.  Lab studies requested including cytology, cell count, LDH, albumin, protein.  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  Parenchyma on most recent cross-sectional images 04/2020 without significant findings of fibrosis or volume occupying disease.  Possibly exacerbated by mild volume overload at times.   Return in about 3 months (around 12/30/2021).   Lanier Clam, MD 09/29/2021

## 2021-09-29 NOTE — Patient Instructions (Signed)
Nice to meet you  I recommend a repeat thoracentesis to see if it helps with her shortness of breath and chest discomfort.  I will send a message to my colleagues in interventional radiology and see if we can get this scheduled for you on Friday of next week.  If not then on Thursday.  Return to clinic in 3 months or sooner if needed with Dr. Silas Flood

## 2021-10-03 ENCOUNTER — Other Ambulatory Visit: Payer: Self-pay | Admitting: Family

## 2021-10-03 ENCOUNTER — Telehealth: Payer: Self-pay

## 2021-10-03 DIAGNOSIS — G8929 Other chronic pain: Secondary | ICD-10-CM

## 2021-10-03 DIAGNOSIS — E1142 Type 2 diabetes mellitus with diabetic polyneuropathy: Secondary | ICD-10-CM

## 2021-10-03 NOTE — Telephone Encounter (Signed)
Rx last refilled on 08/31/21 and treatment agreement on file from October 2022

## 2021-10-03 NOTE — Telephone Encounter (Signed)
If patient no longer required may discontinue Topiramate

## 2021-10-03 NOTE — Telephone Encounter (Signed)
Beth with [name] called stating that patient was complaining of burning on urination for a few days. She would like order to collect urine. She also stated that patient is becoming hard to arouse. Would like to know if would consider stopping topiramate. Varbal order given for urine collect  Message routed to Marlowe Sax, NP

## 2021-10-05 NOTE — Telephone Encounter (Signed)
Spoke with Beth with [name] and she stated that patient is trying to taper off the Aplrazolam to see if that will help with the sleepiness. If that doesn't seem to work then he will try discontinuing topiramate

## 2021-10-05 NOTE — Telephone Encounter (Signed)
Noted  

## 2021-10-06 ENCOUNTER — Ambulatory Visit (HOSPITAL_COMMUNITY)
Admission: RE | Admit: 2021-10-06 | Discharge: 2021-10-06 | Disposition: A | Discharge status: Home / Self Care | Payer: Medicare PPO | Source: Ambulatory Visit | Attending: Pulmonary Disease | Admitting: Pulmonary Disease

## 2021-10-06 ENCOUNTER — Ambulatory Visit (HOSPITAL_COMMUNITY)
Admission: RE | Admit: 2021-10-06 | Discharge: 2021-10-06 | Disposition: A | Discharge status: Home / Self Care | Payer: Medicare PPO | Source: Ambulatory Visit | Attending: Interventional Radiology | Admitting: Interventional Radiology

## 2021-10-06 DIAGNOSIS — Z794 Long term (current) use of insulin: Secondary | ICD-10-CM | POA: Insufficient documentation

## 2021-10-06 DIAGNOSIS — J90 Pleural effusion, not elsewhere classified: Secondary | ICD-10-CM | POA: Insufficient documentation

## 2021-10-06 DIAGNOSIS — E119 Type 2 diabetes mellitus without complications: Secondary | ICD-10-CM | POA: Diagnosis not present

## 2021-10-06 HISTORY — PX: IR THORACENTESIS ASP PLEURAL SPACE W/IMG GUIDE: IMG5380

## 2021-10-06 LAB — LACTATE DEHYDROGENASE, PLEURAL OR PERITONEAL FLUID: LD, Fluid: 102 U/L — ABNORMAL HIGH (ref 3–23)

## 2021-10-06 LAB — BODY FLUID CELL COUNT WITH DIFFERENTIAL
Eos, Fluid: 1 %
Lymphs, Fluid: 90 %
Monocyte-Macrophage-Serous Fluid: 8 % — ABNORMAL LOW (ref 50–90)
Neutrophil Count, Fluid: 1 % (ref 0–25)
Total Nucleated Cell Count, Fluid: 721 cu mm (ref 0–1000)

## 2021-10-06 LAB — GLUCOSE, CAPILLARY: Glucose-Capillary: 159 mg/dL — ABNORMAL HIGH (ref 70–99)

## 2021-10-06 LAB — ALBUMIN, PLEURAL OR PERITONEAL FLUID: Albumin, Fluid: 2.2 g/dL

## 2021-10-06 LAB — PROTEIN, PLEURAL OR PERITONEAL FLUID: Total protein, fluid: 3.8 g/dL

## 2021-10-06 MED ORDER — LIDOCAINE HCL 1 % IJ SOLN
INTRAMUSCULAR | Status: AC
  Administered 2021-10-06: 10 mL
  Filled 2021-10-06: qty 20

## 2021-10-06 NOTE — Procedures (Signed)
PROCEDURE SUMMARY:  Successful US guided left thoracentesis. Yielded 550cc of pleural fluid. Pt tolerated procedure well. No immediate complications.  Specimen was sent for labs. CXR ordered.  EBL < 5 mL  Leocadio Heal PA-C 10/06/2021 9:55 AM

## 2021-10-10 ENCOUNTER — Telehealth: Payer: Self-pay | Admitting: Family

## 2021-10-10 LAB — CYTOLOGY - NON PAP

## 2021-10-10 NOTE — Telephone Encounter (Signed)
Patient notified through My Chart

## 2021-10-10 NOTE — Telephone Encounter (Signed)
Final urine culture done by [facility] indicates no urinary tract infection.

## 2021-10-17 ENCOUNTER — Telehealth: Payer: Self-pay | Admitting: Pulmonary Disease

## 2021-10-18 NOTE — Telephone Encounter (Signed)
Sir,  Patient is calling for results of his procedure that was done.  Please advise   Thank you

## 2021-10-18 NOTE — Telephone Encounter (Signed)
Results are similar to prior fluid studies. No evidence of cancer in the fluid. Thanks!

## 2021-10-24 ENCOUNTER — Other Ambulatory Visit: Payer: Self-pay | Admitting: Orthopedic Surgery

## 2021-10-24 NOTE — Telephone Encounter (Signed)
Called and spoke to patient this morning about results. Nothing further needed

## 2021-10-25 DIAGNOSIS — G4733 Obstructive sleep apnea (adult) (pediatric): Secondary | ICD-10-CM | POA: Diagnosis not present

## 2021-10-30 ENCOUNTER — Other Ambulatory Visit: Payer: Self-pay | Admitting: Family

## 2021-10-31 ENCOUNTER — Telehealth: Payer: Self-pay | Admitting: *Deleted

## 2021-10-31 ENCOUNTER — Emergency Department (HOSPITAL_BASED_OUTPATIENT_CLINIC_OR_DEPARTMENT_OTHER): Payer: Medicare PPO

## 2021-10-31 ENCOUNTER — Other Ambulatory Visit: Payer: Self-pay

## 2021-10-31 ENCOUNTER — Encounter (HOSPITAL_BASED_OUTPATIENT_CLINIC_OR_DEPARTMENT_OTHER): Payer: Self-pay | Admitting: Emergency Medicine

## 2021-10-31 ENCOUNTER — Observation Stay (HOSPITAL_BASED_OUTPATIENT_CLINIC_OR_DEPARTMENT_OTHER)
Admission: EM | Admit: 2021-10-31 | Discharge: 2021-11-02 | Disposition: A | Discharge status: Home Health | Payer: Medicare PPO | Attending: Family Medicine | Admitting: Family Medicine

## 2021-10-31 DIAGNOSIS — R519 Headache, unspecified: Secondary | ICD-10-CM | POA: Diagnosis not present

## 2021-10-31 DIAGNOSIS — Z79899 Other long term (current) drug therapy: Secondary | ICD-10-CM | POA: Diagnosis not present

## 2021-10-31 DIAGNOSIS — D649 Anemia, unspecified: Secondary | ICD-10-CM | POA: Diagnosis present

## 2021-10-31 DIAGNOSIS — I5042 Chronic combined systolic (congestive) and diastolic (congestive) heart failure: Secondary | ICD-10-CM | POA: Insufficient documentation

## 2021-10-31 DIAGNOSIS — Z794 Long term (current) use of insulin: Secondary | ICD-10-CM | POA: Insufficient documentation

## 2021-10-31 DIAGNOSIS — Z20822 Contact with and (suspected) exposure to covid-19: Secondary | ICD-10-CM | POA: Insufficient documentation

## 2021-10-31 DIAGNOSIS — N189 Chronic kidney disease, unspecified: Secondary | ICD-10-CM | POA: Diagnosis not present

## 2021-10-31 DIAGNOSIS — E1169 Type 2 diabetes mellitus with other specified complication: Secondary | ICD-10-CM | POA: Insufficient documentation

## 2021-10-31 DIAGNOSIS — J9611 Chronic respiratory failure with hypoxia: Secondary | ICD-10-CM | POA: Diagnosis present

## 2021-10-31 DIAGNOSIS — Z7982 Long term (current) use of aspirin: Secondary | ICD-10-CM | POA: Diagnosis not present

## 2021-10-31 DIAGNOSIS — E1140 Type 2 diabetes mellitus with diabetic neuropathy, unspecified: Secondary | ICD-10-CM | POA: Insufficient documentation

## 2021-10-31 DIAGNOSIS — I10 Essential (primary) hypertension: Secondary | ICD-10-CM

## 2021-10-31 DIAGNOSIS — R200 Anesthesia of skin: Principal | ICD-10-CM | POA: Insufficient documentation

## 2021-10-31 DIAGNOSIS — E785 Hyperlipidemia, unspecified: Secondary | ICD-10-CM | POA: Diagnosis not present

## 2021-10-31 DIAGNOSIS — I130 Hypertensive heart and chronic kidney disease with heart failure and stage 1 through stage 4 chronic kidney disease, or unspecified chronic kidney disease: Secondary | ICD-10-CM | POA: Insufficient documentation

## 2021-10-31 DIAGNOSIS — Z950 Presence of cardiac pacemaker: Secondary | ICD-10-CM | POA: Insufficient documentation

## 2021-10-31 DIAGNOSIS — R0989 Other specified symptoms and signs involving the circulatory and respiratory systems: Secondary | ICD-10-CM | POA: Diagnosis present

## 2021-10-31 DIAGNOSIS — R531 Weakness: Secondary | ICD-10-CM | POA: Diagnosis not present

## 2021-10-31 DIAGNOSIS — Z23 Encounter for immunization: Secondary | ICD-10-CM | POA: Insufficient documentation

## 2021-10-31 DIAGNOSIS — Z7984 Long term (current) use of oral hypoglycemic drugs: Secondary | ICD-10-CM | POA: Insufficient documentation

## 2021-10-31 DIAGNOSIS — R202 Paresthesia of skin: Secondary | ICD-10-CM | POA: Diagnosis not present

## 2021-10-31 DIAGNOSIS — E119 Type 2 diabetes mellitus without complications: Secondary | ICD-10-CM

## 2021-10-31 DIAGNOSIS — I5022 Chronic systolic (congestive) heart failure: Secondary | ICD-10-CM | POA: Diagnosis not present

## 2021-10-31 DIAGNOSIS — I495 Sick sinus syndrome: Secondary | ICD-10-CM | POA: Diagnosis not present

## 2021-10-31 DIAGNOSIS — I639 Cerebral infarction, unspecified: Secondary | ICD-10-CM

## 2021-10-31 DIAGNOSIS — N183 Chronic kidney disease, stage 3 unspecified: Secondary | ICD-10-CM | POA: Insufficient documentation

## 2021-10-31 DIAGNOSIS — E1122 Type 2 diabetes mellitus with diabetic chronic kidney disease: Secondary | ICD-10-CM | POA: Insufficient documentation

## 2021-10-31 DIAGNOSIS — I442 Atrioventricular block, complete: Secondary | ICD-10-CM | POA: Diagnosis present

## 2021-10-31 DIAGNOSIS — G4733 Obstructive sleep apnea (adult) (pediatric): Secondary | ICD-10-CM | POA: Diagnosis not present

## 2021-10-31 DIAGNOSIS — Z87891 Personal history of nicotine dependence: Secondary | ICD-10-CM | POA: Insufficient documentation

## 2021-10-31 DIAGNOSIS — R29898 Other symptoms and signs involving the musculoskeletal system: Secondary | ICD-10-CM

## 2021-10-31 DIAGNOSIS — Z433 Encounter for attention to colostomy: Secondary | ICD-10-CM

## 2021-10-31 DIAGNOSIS — Z933 Colostomy status: Secondary | ICD-10-CM | POA: Insufficient documentation

## 2021-10-31 LAB — CBC
HCT: 32.6 % — ABNORMAL LOW (ref 39.0–52.0)
Hemoglobin: 10.6 g/dL — ABNORMAL LOW (ref 13.0–17.0)
MCH: 30.6 pg (ref 26.0–34.0)
MCHC: 32.5 g/dL (ref 30.0–36.0)
MCV: 94.2 fL (ref 80.0–100.0)
Platelets: 239 10*3/uL (ref 150–400)
RBC: 3.46 MIL/uL — ABNORMAL LOW (ref 4.22–5.81)
RDW: 14.4 % (ref 11.5–15.5)
WBC: 7.9 10*3/uL (ref 4.0–10.5)
nRBC: 0 % (ref 0.0–0.2)

## 2021-10-31 LAB — COMPREHENSIVE METABOLIC PANEL
ALT: 11 U/L (ref 0–44)
AST: 13 U/L — ABNORMAL LOW (ref 15–41)
Albumin: 3.8 g/dL (ref 3.5–5.0)
Alkaline Phosphatase: 70 U/L (ref 38–126)
Anion gap: 12 (ref 5–15)
BUN: 43 mg/dL — ABNORMAL HIGH (ref 8–23)
CO2: 30 mmol/L (ref 22–32)
Calcium: 9 mg/dL (ref 8.9–10.3)
Chloride: 93 mmol/L — ABNORMAL LOW (ref 98–111)
Creatinine, Ser: 2.33 mg/dL — ABNORMAL HIGH (ref 0.61–1.24)
GFR, Estimated: 29 mL/min — ABNORMAL LOW (ref >60)
Glucose, Bld: 295 mg/dL — ABNORMAL HIGH (ref 70–99)
Potassium: 3.6 mmol/L (ref 3.5–5.1)
Sodium: 135 mmol/L (ref 135–145)
Total Bilirubin: 0.4 mg/dL (ref 0.3–1.2)
Total Protein: 7.2 g/dL (ref 6.5–8.1)

## 2021-10-31 LAB — DIFFERENTIAL
Abs Immature Granulocytes: 0.03 10*3/uL (ref 0.00–0.07)
Basophils Absolute: 0.1 10*3/uL (ref 0.0–0.1)
Basophils Relative: 1 %
Eosinophils Absolute: 0.3 10*3/uL (ref 0.0–0.5)
Eosinophils Relative: 3 %
Immature Granulocytes: 0 %
Lymphocytes Relative: 14 %
Lymphs Abs: 1.1 10*3/uL (ref 0.7–4.0)
Monocytes Absolute: 0.9 10*3/uL (ref 0.1–1.0)
Monocytes Relative: 11 %
Neutro Abs: 5.6 10*3/uL (ref 1.7–7.7)
Neutrophils Relative %: 71 %

## 2021-10-31 LAB — RESP PANEL BY RT-PCR (FLU A&B, COVID) ARPGX2
Influenza A by PCR: NEGATIVE
Influenza B by PCR: NEGATIVE
SARS Coronavirus 2 by RT PCR: NEGATIVE

## 2021-10-31 LAB — URINALYSIS, ROUTINE W REFLEX MICROSCOPIC
Bilirubin Urine: NEGATIVE
Glucose, UA: 500 mg/dL — AB
Hgb urine dipstick: NEGATIVE
Ketones, ur: NEGATIVE mg/dL
Leukocytes,Ua: NEGATIVE
Nitrite: NEGATIVE
Protein, ur: NEGATIVE mg/dL
Specific Gravity, Urine: 1.008 (ref 1.005–1.030)
pH: 5 (ref 5.0–8.0)

## 2021-10-31 LAB — ETHANOL: Alcohol, Ethyl (B): <10 mg/dL (ref <10)

## 2021-10-31 LAB — RAPID URINE DRUG SCREEN, HOSP PERFORMED
Amphetamines: NOT DETECTED
Barbiturates: NOT DETECTED
Benzodiazepines: NOT DETECTED
Cocaine: NOT DETECTED
Opiates: NOT DETECTED
Tetrahydrocannabinol: NOT DETECTED

## 2021-10-31 NOTE — Progress Notes (Signed)
Briefly, [name] is a 71 y.o. male with hx of DM2, HLD, HTN, morbid obesity, wheelchair bound at baseline, who presented to [facility] with mild lower R facial droop with drooling from his mouth along with R facial numbness and prominent RLE weakness. No upper facial involvement per ED team. LKW about a week ago and symptoms persistent.  CTH negative for an acute stroke but has chronic small vessel disease.  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 Unlikely bells palsy given RLE weakness. Cord lesion would not cause facial droop.  Plan: - Medicine admission with stroke workup. - Frequent Neuro checks per stroke unit protocol - Recommend brain imaging with MRI Brain without contrast - Recommend Vascular imaging with MRA Angio Head without contrast and US Carotid doppler(elevated creatinine clearance) - Recommend obtaining TTE - Recommend obtaining Lipid panel with LDL - Please start statin if LDL > 70 - Recommend HbA1c - Antithrombotic - Aspirin 81mg  daily along with plavix 75mg  daily x 21days, followed by Aspirin 81mg  daily alone. - Recommend DVT ppx - SBP goal - permissive hypertension first 24 h < 220/110. Held home meds.  - Recommend Telemetry monitoring for arrythmia - Recommend bedside swallow screen prior to PO intake. - Stroke education booklet - Recommend PT/OT/SLP consult  Plan discussed with Dr. Philip Aspen.  Please page neurology when patient arrives at [facility] Pager Number [phone]

## 2021-10-31 NOTE — ED Notes (Signed)
ED Provider at bedside. 

## 2021-10-31 NOTE — ED Provider Notes (Signed)
[facility] EMERGENCY DEPT Provider Note   CSN: [id number] Arrival date & time: 10/31/21  1847     History  Chief Complaint  Patient presents with   Weakness    Jesse [name] is a 71 y.o. male.  71 year old male with a history of heart failure with preserved ejection fraction, AV block status post pacemaker, CKD, OSA, DM 2, who is wheelchair-bound who presents to the emergency department with changes in sensation of his right face and right lower extremity weakness.  Patient states that sometime within the past 7 to 10 days he started noticing that he was drooling from his right lip.  Says that he also noticed decreased sensation in that area.  Was told by someone that he may have facial droop as well.  Home health nurse came out to evaluate the patient who also noted that he had new right lower extremity weakness that was not present previously.  He was told to come into the emergency department to be evaluated for stroke.  Denies any dizziness, vision changes, slurred speech, weakness or numbness elsewhere.   Weakness       Home Medications Prior to Admission medications   Medication Sig Start Date End Date Taking? Authorizing Provider  alfuzosin (UROXATRAL) 10 MG 24 hr tablet TAKE 1 TABLET BY MOUTH EVERY DAY 07/28/21   Lauree Chandler, NP  ALPRAZolam (XANAX) 0.5 MG tablet TAKE 1 TABLET(0.5 MG) BY MOUTH TWICE DAILY AS NEEDED 06/27/21   Ngetich, [name] C, NP  aspirin EC 81 MG tablet Take 81 mg by mouth daily. Swallow whole.    [provider]  Blood Glucose Monitoring Suppl (ONE TOUCH ULTRA MINI) w/Device KIT Use to test blood sugar once daily. Dx:E11.22 04/12/21   Ngetich, [name] C, NP  Carboxymethylcellulose Sodium (DRY EYE RELIEF OP) Place 1 drop into both eyes daily as needed (dry eyes).    [provider]  cyanocobalamin 1000 MCG tablet Take 1,000 mcg by mouth daily.    [provider]  DULoxetine (CYMBALTA) 60 MG capsule Take 60 mg by  mouth daily.    [provider]  empagliflozin (JARDIANCE) 25 MG TABS tablet TAKE 1 TABLET BY MOUTH EVERY DAY 10/24/21   Ngetich, [name] C, NP  ferrous sulfate (FEROSUL) 325 (65 FE) MG tablet TAKE 1 TABLET(325 MG) BY MOUTH DAILY WITH BREAKFAST 07/27/21   Ngetich, [name] C, NP  furosemide (LASIX) 40 MG tablet Take 80 mg by mouth in the morning and at bedtime. 15m in the morning and 827min the afternoon.    [provider]  gabapentin (NEURONTIN) 100 MG capsule TAKE 1 CAPSULE(100 MG) BY MOUTH TWICE DAILY 07/28/21   Ngetich, [name] C, NP  glucose blood test strip Use to test blood sugar once daily. Dx:E11.22 04/12/21   Ngetich, [name] C, NP  insulin detemir (LEVEMIR FLEXPEN) 100 UNIT/ML FlexPen Inject 40 Units into the skin at bedtime.    [provider]  Insulin Pen Needle 32G X 4 MM MISC Use to inject insulin twice daily. Dx: E11.22 09/11/21   Ngetich, [name] C, NP  ketoconazole (NIZORAL) 2 % cream Apply 1 application topically as needed for irritation. 03/16/21   Ngetich, DiNelda BucksNP  Lancet Device MISC 1 Device by Does not apply route daily. 06/16/21   Ngetich, [name] C, NP  Lancets MISC 1 Device by Does not apply route as directed. Use as directed. 06/23/21   Ngetich, [name] C, NP  Loperamide HCl (IMODIUM PO) Take 3 mg by  mouth 3 (three) times daily with meals.    [provider]  losartan (COZAAR) 25 MG tablet Take 25 mg by mouth daily.    [provider]  NYSTATIN powder Apply 1 application  topically daily as needed (Yeast infection to afffected area). 05/03/21   [provider]  OVER THE COUNTER MEDICATION Apply 1 Application topically every other day. Head and shoulder medicated shampoo alternate with selsun blue medicated    [provider]  OXYGEN Inhale 2 L into the lungs continuous.    [provider]  potassium chloride SA (KLOR-CON M) 20 MEQ tablet Take 20 mEq by mouth 2 (two) times daily.    [provider]  Red Yeast Rice  600 MG CAPS Take 1,200 mg by mouth daily.    [provider]  rosuvastatin (CRESTOR) 10 MG tablet Take 1 tablet by mouth daily. 12/14/15   [provider]  topiramate (TOPAMAX) 25 MG tablet TAKE 1 TABLET BY MOUTH EVERY DAY 10/24/21   Ngetich, [name] C, NP  traMADol (ULTRAM) 50 MG tablet TAKE 1 TABLET(50 MG) BY MOUTH TWICE DAILY AS NEEDED 10/03/21   Ngetich, [name] C, NP  traZODone (DESYREL) 50 MG tablet Take 2 tablets (100 mg total) by mouth at bedtime. 08/03/21   Lauree Chandler, NP  triamcinolone cream (KENALOG) 0.1 % Apply 1 application. topically 2 (two) times daily. Apply to affected areas on shin area 06/30/21   Ngetich, [name] C, NP  TURMERIC PO Take 150 mg by mouth daily.    [provider]  VICTOZA 18 MG/3ML SOPN ADMINISTER 1.8 MG UNDER THE SKIN EVERY DAY 06/28/21   Ngetich, [name] C, NP  Vitamin D, Ergocalciferol, (DRISDOL) 1.25 MG (50000 UNIT) CAPS capsule Take 50,000 Units by mouth every 7 (seven) days.    [provider]      Allergies    Tape    Review of Systems   Review of Systems  Neurological:  Positive for weakness.    Physical Exam Updated Vital Signs BP (!) 113/55   Pulse 70   Temp 98 F (36.7 C)   Resp 16   SpO2 100%  Physical Exam  ED Results / Procedures / Treatments   Labs (all labs ordered are listed, but only abnormal results are displayed) Labs Reviewed  CBC - Abnormal; Notable for the following components:      Result Value   RBC 3.46 (*)    Hemoglobin 10.6 (*)    HCT 32.6 (*)    All other components within normal limits  COMPREHENSIVE METABOLIC PANEL - Abnormal; Notable for the following components:   Chloride 93 (*)    Glucose, Bld 295 (*)    BUN 43 (*)    Creatinine, Ser 2.33 (*)    AST 13 (*)    GFR, Estimated 29 (*)    All other components within normal limits  URINALYSIS, ROUTINE W REFLEX MICROSCOPIC - Abnormal; Notable for the following components:   Color, Urine COLORLESS (*)    Glucose, UA 500 (*)     All other components within normal limits  RESP PANEL BY RT-PCR (FLU A&B, COVID) ARPGX2  ETHANOL  DIFFERENTIAL  RAPID URINE DRUG SCREEN, HOSP PERFORMED    EKG EKG Interpretation  Date/Time:  Tuesday October 31 2021 19:28:57 EDT Ventricular Rate:  72 PR Interval:  188 QRS Duration: 164 QT Interval:  528 QTC Calculation: 578 R Axis:   51 Text Interpretation: AV dual-paced rhythm Abnormal ECG When compared with  ECG of 15-Sep-2021 14:10, Vent. rate has increased BY  12 BPM Confirmed by Margaretmary Eddy [phone]) on 10/31/2021 11:21:22 PM  Radiology CT HEAD WO CONTRAST  Result Date: 10/31/2021 CLINICAL DATA:  Headache and weakness. EXAM: CT HEAD WITHOUT CONTRAST TECHNIQUE: Contiguous axial images were obtained from the base of the skull through the vertex without intravenous contrast. RADIATION DOSE REDUCTION: This exam was performed according to the departmental dose-optimization program which includes automated exposure control, adjustment of the mA and/or kV according to patient size and/or use of iterative reconstruction technique. COMPARISON:  None Available. FINDINGS: Brain: There is mild cerebral atrophy with widening of the extra-axial spaces and ventricular dilatation. There are areas of decreased attenuation within the white matter tracts of the supratentorial brain, consistent with microvascular disease changes. Vascular: No hyperdense vessel or unexpected calcification. Skull: Normal. Negative for fracture or focal lesion. Sinuses/Orbits: No acute finding. Other: None. IMPRESSION: Generalized cerebral atrophy and chronic white matter small vessel ischemic changes without evidence of an acute intracranial abnormality. Electronically Signed   By: Virgina Norfolk M.D.   On: 10/31/2021 19:40    Procedures Procedures  {Document cardiac monitor, telemetry assessment procedure when appropriate:1}  Medications Ordered in ED Medications - No data to display  ED Course/ Medical Decision  Making/ A&P Clinical Course as of 10/31/21 2348  Tue Oct 31, 2021  2338 Spoke with Neuro Dr Lorrin Goodell. Feels that pt should be admitted for further evaluation. [RP]    Clinical Course User Index [RP] Fransico Meadow, MD                           Medical Decision Making Amount and/or Complexity of Data Reviewed Radiology: ordered.   ***  {Document critical care time when appropriate:1} {Document review of labs and clinical decision tools ie heart score, Chads2Vasc2 etc:1}  {Document your independent review of radiology images, and any outside records:1} {Document your discussion with family members, caretakers, and with consultants:1} {Document social determinants of health affecting pt's care:1} {Document your decision making why or why not admission, treatments were needed:1} Final Clinical Impression(s) / ED Diagnoses Final diagnoses:  None    Rx / DC Orders ED Discharge Orders     None

## 2021-10-31 NOTE — ED Triage Notes (Signed)
Patient drooling, right lip feels different then left side. Headache. Weakness. Started 1 week ago. Spoke with PCP today and was advised to get check out.  New pacemake in August.

## 2021-10-31 NOTE — Telephone Encounter (Signed)
Need urgent evaluation in the ED for possible stroke symptoms.

## 2021-10-31 NOTE — ED Notes (Signed)
Patient wears 2LNC at baseline. Patient provided with oxygen tank while waiting.

## 2021-10-31 NOTE — Telephone Encounter (Signed)
Patient notified and agreed. Stated that he was going to call his daughter for transportation.

## 2021-10-31 NOTE — Telephone Encounter (Signed)
Jesse [name] with [name] [phone] and stated that she saw patient today and he was stating that for the PAST 10 Days he has had drooling for Right side of mouth with Right Eye Drooping.   No Visible Changes to Face, no Headaches, no Numbness/Tingling, No slurred speech  Please Advise.

## 2021-11-01 DIAGNOSIS — Z433 Encounter for attention to colostomy: Secondary | ICD-10-CM

## 2021-11-01 DIAGNOSIS — Z7984 Long term (current) use of oral hypoglycemic drugs: Secondary | ICD-10-CM | POA: Diagnosis not present

## 2021-11-01 DIAGNOSIS — N189 Chronic kidney disease, unspecified: Secondary | ICD-10-CM | POA: Diagnosis not present

## 2021-11-01 DIAGNOSIS — N183 Chronic kidney disease, stage 3 unspecified: Secondary | ICD-10-CM | POA: Diagnosis not present

## 2021-11-01 DIAGNOSIS — R200 Anesthesia of skin: Secondary | ICD-10-CM | POA: Diagnosis not present

## 2021-11-01 DIAGNOSIS — E785 Hyperlipidemia, unspecified: Secondary | ICD-10-CM | POA: Diagnosis not present

## 2021-11-01 DIAGNOSIS — Z950 Presence of cardiac pacemaker: Secondary | ICD-10-CM | POA: Diagnosis not present

## 2021-11-01 DIAGNOSIS — E1169 Type 2 diabetes mellitus with other specified complication: Secondary | ICD-10-CM | POA: Diagnosis not present

## 2021-11-01 DIAGNOSIS — I10 Essential (primary) hypertension: Secondary | ICD-10-CM

## 2021-11-01 DIAGNOSIS — G4733 Obstructive sleep apnea (adult) (pediatric): Secondary | ICD-10-CM | POA: Diagnosis not present

## 2021-11-01 DIAGNOSIS — Z87891 Personal history of nicotine dependence: Secondary | ICD-10-CM | POA: Diagnosis not present

## 2021-11-01 DIAGNOSIS — D649 Anemia, unspecified: Secondary | ICD-10-CM | POA: Diagnosis not present

## 2021-11-01 DIAGNOSIS — Z794 Long term (current) use of insulin: Secondary | ICD-10-CM | POA: Diagnosis not present

## 2021-11-01 DIAGNOSIS — Z79899 Other long term (current) drug therapy: Secondary | ICD-10-CM | POA: Diagnosis not present

## 2021-11-01 DIAGNOSIS — Z23 Encounter for immunization: Secondary | ICD-10-CM | POA: Diagnosis not present

## 2021-11-01 DIAGNOSIS — Z933 Colostomy status: Secondary | ICD-10-CM | POA: Diagnosis not present

## 2021-11-01 DIAGNOSIS — E1140 Type 2 diabetes mellitus with diabetic neuropathy, unspecified: Secondary | ICD-10-CM | POA: Diagnosis not present

## 2021-11-01 DIAGNOSIS — I130 Hypertensive heart and chronic kidney disease with heart failure and stage 1 through stage 4 chronic kidney disease, or unspecified chronic kidney disease: Secondary | ICD-10-CM | POA: Diagnosis not present

## 2021-11-01 DIAGNOSIS — I5022 Chronic systolic (congestive) heart failure: Secondary | ICD-10-CM | POA: Diagnosis not present

## 2021-11-01 DIAGNOSIS — R0989 Other specified symptoms and signs involving the circulatory and respiratory systems: Secondary | ICD-10-CM | POA: Diagnosis not present

## 2021-11-01 DIAGNOSIS — I495 Sick sinus syndrome: Secondary | ICD-10-CM | POA: Diagnosis not present

## 2021-11-01 DIAGNOSIS — E1122 Type 2 diabetes mellitus with diabetic chronic kidney disease: Secondary | ICD-10-CM | POA: Diagnosis not present

## 2021-11-01 DIAGNOSIS — J9611 Chronic respiratory failure with hypoxia: Secondary | ICD-10-CM | POA: Diagnosis not present

## 2021-11-01 DIAGNOSIS — Z7982 Long term (current) use of aspirin: Secondary | ICD-10-CM | POA: Diagnosis not present

## 2021-11-01 DIAGNOSIS — I5042 Chronic combined systolic (congestive) and diastolic (congestive) heart failure: Secondary | ICD-10-CM | POA: Diagnosis not present

## 2021-11-01 DIAGNOSIS — Z20822 Contact with and (suspected) exposure to covid-19: Secondary | ICD-10-CM | POA: Diagnosis not present

## 2021-11-01 DIAGNOSIS — R531 Weakness: Secondary | ICD-10-CM | POA: Diagnosis present

## 2021-11-01 LAB — CBG MONITORING, ED: Glucose-Capillary: 171 mg/dL — ABNORMAL HIGH (ref 70–99)

## 2021-11-01 LAB — GLUCOSE, CAPILLARY
Glucose-Capillary: 229 mg/dL — ABNORMAL HIGH (ref 70–99)
Glucose-Capillary: 218 mg/dL — ABNORMAL HIGH (ref 70–99)
Glucose-Capillary: 273 mg/dL — ABNORMAL HIGH (ref 70–99)

## 2021-11-01 MED ORDER — ACETAMINOPHEN 325 MG PO TABS
650.0000 mg | ORAL_TABLET | ORAL | Status: DC | PRN
  Administered 2021-11-01 – 2021-11-02 (×3): 650 mg via ORAL
  Filled 2021-11-01 (×3): qty 2

## 2021-11-01 MED ORDER — ENOXAPARIN SODIUM 30 MG/0.3ML IJ SOSY
30.0000 mg | PREFILLED_SYRINGE | INTRAMUSCULAR | Status: DC
  Administered 2021-11-01: 30 mg via SUBCUTANEOUS
  Filled 2021-11-01: qty 0.3

## 2021-11-01 MED ORDER — INSULIN ASPART 100 UNIT/ML IJ SOLN
0.0000 [IU] | Freq: Three times a day (TID) | INTRAMUSCULAR | Status: DC
  Administered 2021-11-01 (×2): 3 [IU] via SUBCUTANEOUS
  Administered 2021-11-02: 2 [IU] via SUBCUTANEOUS

## 2021-11-01 MED ORDER — LOSARTAN POTASSIUM 25 MG PO TABS
25.0000 mg | ORAL_TABLET | Freq: Every day | ORAL | Status: DC
  Administered 2021-11-01 – 2021-11-02 (×2): 25 mg via ORAL
  Filled 2021-11-01 (×2): qty 1

## 2021-11-01 MED ORDER — FERROUS SULFATE 325 (65 FE) MG PO TABS
325.0000 mg | ORAL_TABLET | Freq: Every day | ORAL | Status: DC
  Administered 2021-11-02: 325 mg via ORAL
  Filled 2021-11-01: qty 1

## 2021-11-01 MED ORDER — ALPRAZOLAM 0.5 MG PO TABS: 0.5000 mg | ORAL_TABLET | Freq: Two times a day (BID) | ORAL | Status: DC | PRN

## 2021-11-01 MED ORDER — ASPIRIN 81 MG PO TBEC
81.0000 mg | DELAYED_RELEASE_TABLET | Freq: Every day | ORAL | Status: DC
  Administered 2021-11-01 – 2021-11-02 (×2): 81 mg via ORAL
  Filled 2021-11-01 (×2): qty 1

## 2021-11-01 MED ORDER — CLOPIDOGREL BISULFATE 75 MG PO TABS
75.0000 mg | ORAL_TABLET | Freq: Every day | ORAL | Status: DC
  Administered 2021-11-01 – 2021-11-02 (×2): 75 mg via ORAL
  Filled 2021-11-01 (×2): qty 1

## 2021-11-01 MED ORDER — POTASSIUM CHLORIDE CRYS ER 20 MEQ PO TBCR
20.0000 meq | EXTENDED_RELEASE_TABLET | Freq: Two times a day (BID) | ORAL | Status: DC
  Administered 2021-11-01 – 2021-11-02 (×3): 20 meq via ORAL
  Filled 2021-11-01 (×3): qty 1

## 2021-11-01 MED ORDER — CALCIUM CARBONATE ANTACID 500 MG PO CHEW
1.0000 | CHEWABLE_TABLET | Freq: Every day | ORAL | Status: DC | PRN
  Administered 2021-11-01: 200 mg via ORAL
  Filled 2021-11-01: qty 1

## 2021-11-01 MED ORDER — VITAMIN B-12 1000 MCG PO TABS
1000.0000 ug | ORAL_TABLET | Freq: Every day | ORAL | Status: DC
  Administered 2021-11-02: 1000 ug via ORAL
  Filled 2021-11-01: qty 1

## 2021-11-01 MED ORDER — TRAMADOL HCL 50 MG PO TABS: 50.0000 mg | ORAL_TABLET | Freq: Two times a day (BID) | ORAL | Status: DC | PRN

## 2021-11-01 MED ORDER — LOPERAMIDE HCL 2 MG PO CAPS
2.0000 mg | ORAL_CAPSULE | Freq: Three times a day (TID) | ORAL | Status: DC
  Administered 2021-11-01 – 2021-11-02 (×3): 2 mg via ORAL
  Filled 2021-11-01 (×3): qty 1

## 2021-11-01 MED ORDER — EMPAGLIFLOZIN 25 MG PO TABS
25.0000 mg | ORAL_TABLET | Freq: Every day | ORAL | Status: DC
  Administered 2021-11-01 – 2021-11-02 (×2): 25 mg via ORAL
  Filled 2021-11-01 (×2): qty 1

## 2021-11-01 MED ORDER — FUROSEMIDE 40 MG PO TABS
80.0000 mg | ORAL_TABLET | Freq: Two times a day (BID) | ORAL | Status: DC
  Administered 2021-11-01 – 2021-11-02 (×2): 80 mg via ORAL
  Filled 2021-11-01 (×2): qty 2

## 2021-11-01 MED ORDER — STROKE: EARLY STAGES OF RECOVERY BOOK
Freq: Once | Status: AC
  Filled 2021-11-01: qty 1

## 2021-11-01 MED ORDER — ROSUVASTATIN CALCIUM 5 MG PO TABS
10.0000 mg | ORAL_TABLET | Freq: Every day | ORAL | Status: DC
  Administered 2021-11-01 – 2021-11-02 (×2): 10 mg via ORAL
  Filled 2021-11-01 (×2): qty 2

## 2021-11-01 MED ORDER — TRAZODONE HCL 100 MG PO TABS
100.0000 mg | ORAL_TABLET | Freq: Every day | ORAL | Status: DC
  Administered 2021-11-01: 100 mg via ORAL
  Filled 2021-11-01: qty 1

## 2021-11-01 MED ORDER — ACETAMINOPHEN 325 MG PO TABS
650.0000 mg | ORAL_TABLET | Freq: Four times a day (QID) | ORAL | Status: DC | PRN
  Administered 2021-11-01: 650 mg via ORAL
  Filled 2021-11-01: qty 2

## 2021-11-01 MED ORDER — ACETAMINOPHEN 650 MG RE SUPP: 650.0000 mg | RECTAL | Status: DC | PRN

## 2021-11-01 MED ORDER — ISOSORBIDE MONONITRATE ER 60 MG PO TB24
60.0000 mg | ORAL_TABLET | Freq: Every day | ORAL | Status: DC
  Administered 2021-11-01 – 2021-11-02 (×2): 60 mg via ORAL
  Filled 2021-11-01 (×2): qty 1

## 2021-11-01 MED ORDER — INSULIN DETEMIR 100 UNIT/ML ~~LOC~~ SOLN
40.0000 [IU] | Freq: Every day | SUBCUTANEOUS | Status: DC
  Administered 2021-11-01: 40 [IU] via SUBCUTANEOUS
  Filled 2021-11-01 (×2): qty 0.4

## 2021-11-01 MED ORDER — GABAPENTIN 100 MG PO CAPS
100.0000 mg | ORAL_CAPSULE | Freq: Two times a day (BID) | ORAL | Status: DC
  Administered 2021-11-01 – 2021-11-02 (×3): 100 mg via ORAL
  Filled 2021-11-01 (×3): qty 1

## 2021-11-01 MED ORDER — SENNOSIDES-DOCUSATE SODIUM 8.6-50 MG PO TABS: 1.0000 | ORAL_TABLET | Freq: Every evening | ORAL | Status: DC | PRN

## 2021-11-01 MED ORDER — INFLUENZA VAC A&B SA ADJ QUAD 0.5 ML IM PRSY
0.5000 mL | PREFILLED_SYRINGE | INTRAMUSCULAR | Status: AC
  Administered 2021-11-02: 0.5 mL via INTRAMUSCULAR
  Filled 2021-11-01: qty 0.5

## 2021-11-01 MED ORDER — TOPIRAMATE 25 MG PO TABS
25.0000 mg | ORAL_TABLET | Freq: Every day | ORAL | Status: DC
  Administered 2021-11-01 – 2021-11-02 (×2): 25 mg via ORAL
  Filled 2021-11-01 (×2): qty 1

## 2021-11-01 MED ORDER — ALFUZOSIN HCL ER 10 MG PO TB24
10.0000 mg | ORAL_TABLET | Freq: Every day | ORAL | Status: DC
  Administered 2021-11-02: 10 mg via ORAL
  Filled 2021-11-01 (×2): qty 1

## 2021-11-01 MED ORDER — ACETAMINOPHEN 160 MG/5ML PO SOLN: 650.0000 mg | ORAL | Status: DC | PRN

## 2021-11-01 MED ORDER — DULOXETINE HCL 60 MG PO CPEP
60.0000 mg | ORAL_CAPSULE | Freq: Every day | ORAL | Status: DC
  Administered 2021-11-01 – 2021-11-02 (×2): 60 mg via ORAL
  Filled 2021-11-01 (×3): qty 1

## 2021-11-01 NOTE — ED Notes (Signed)
[facility] FOR ED TO ED TRANSFER TO [place]  [facility] MD ACCEPTING

## 2021-11-01 NOTE — Assessment & Plan Note (Signed)
Continue home cpap at night

## 2021-11-01 NOTE — H&P (Signed)
History and Physical    Patient: Jesse [name] VXY:[id number] DOB: [birth year] DOA: 10/31/2021 DOS: the patient was seen and examined on 11/01/2021 PCP: Ngetich, Nelda Bucks, NP  Patient coming from:  [place]  - lives at an [place] estates. WC bound.    Chief Complaint: right sided weakness and right facial numbness.   HPI: Jesse [name] is a 71 y.o. 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 He states for the past week he noticed that he kept having saliva on the right side of his mouth that required him to wipe his mouth frequently.  He also felt like the right side of his lip feels different than his left, but it's not numb per say. His right eye also kept "clouding up." Last night his primary doctor advised that he go to ER. At the ER, the EDP thought he had some right lower leg weakness on exam, but he has not noticed this on his own.   He has fall allergies and has had some rhinorrhea and mild headache.   Denies any fever/chills, chest pain or palpitations, shortness of breath or cough, abdominal pain, N/V/D, dysuria or leg swelling.   No hx of CVAs in his family. Takes a 50m ASA daily.   He does not smoke or drink alcohol.    ER Course:  vitals: afebrile, bp: 120/95, HR; 76, RR: 18, oxygen: 97% on 2L [place] Pertinent labs: hgb: 10.6, BUN: 43, creatinine: 2.33 (2.2-2.5) CT head: generalized cerebral atrophy and chronic white matter small vessel ischemic changes without evidence of an acute abnormality.  In ED: neurology consulted. Recommended admit for stroke w/u. TRH asked to admit.    Review of Systems: As mentioned in the history of present illness. All other systems reviewed and are negative. Past Medical  History:  Diagnosis Date   Alcohol use disorder in remission    Anxiety and depression    At high risk for falls    Former cigarette smoker    Generalized anxiety disorder    Hard of hearing    Hyperlipidemia due to type 2 diabetes mellitus ([place]    Hypertension    Morbid [place]    Neuropathy    O2 dependent    Risk for falls    Sleep apnea    Type 2 diabetes mellitus ([place]    Past Surgical History:  Procedure Laterality Date   APPENDECTOMY  1980   IR THORACENTESIS ASP PLEURAL SPACE W/IMG GUIDE  05/18/2021   IR THORACENTESIS ASP PLEURAL SPACE W/IMG GUIDE  10/06/2021   KNEE SURGERY Right    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   Procedure: PACEMAKER IMPLANT;  Surgeon: KDeboraha Sprang MD;  [facility];  Service: Cardiovascular;  Laterality: N/A;   Social History:  reports that he quit smoking about 24 years ago. His smoking use included cigarettes. He smoked an average of 1 pack per day. He has never used smokeless tobacco. He reports that he does not currently use alcohol. He reports that he does not use drugs.  Allergies  Allergen Reactions   Tape Rash    Family History  Problem Relation Age of Onset   Diabetes Brother    Bipolar disorder  Daughter    Anxiety disorder Son     Prior to Admission medications   Medication Sig Start Date End Date Taking? Authorizing Provider  alfuzosin (UROXATRAL) 10 MG 24 hr tablet TAKE 1 TABLET BY MOUTH EVERY DAY 07/28/21   Lauree Chandler, NP  ALPRAZolam (XANAX) 0.5 MG tablet TAKE 1 TABLET(0.5 MG) BY MOUTH TWICE DAILY AS NEEDED 06/27/21   Ngetich, [name] C, NP  aspirin EC 81 MG tablet Take 81 mg by mouth daily. Swallow whole.    [provider]  Blood Glucose Monitoring Suppl (ONE TOUCH ULTRA MINI) w/Device KIT Use to test blood sugar once daily. Dx:E11.22 04/12/21   Ngetich, [name] C, NP  Carboxymethylcellulose Sodium (DRY EYE RELIEF OP) Place 1 drop into both eyes daily as needed (dry eyes).    [provider]  cyanocobalamin 1000 MCG tablet Take 1,000 mcg by mouth daily.    [provider]  DULoxetine (CYMBALTA) 60 MG capsule Take 60 mg by mouth daily.    [provider]  empagliflozin (JARDIANCE) 25 MG TABS tablet TAKE 1 TABLET BY MOUTH EVERY DAY 10/24/21   Ngetich, [name] C, NP  ferrous sulfate (FEROSUL) 325 (65 FE) MG tablet TAKE 1 TABLET(325 MG) BY MOUTH DAILY WITH BREAKFAST 07/27/21   Ngetich, [name] C, NP  furosemide (LASIX) 40 MG tablet Take 80 mg by mouth in the morning and at bedtime. 36m in the morning and 847min the afternoon.    [provider]  gabapentin (NEURONTIN) 100 MG capsule TAKE 1 CAPSULE(100 MG) BY MOUTH TWICE DAILY 07/28/21   Ngetich, [name] C, NP  glucose blood test strip Use to test blood sugar once daily. Dx:E11.22 04/12/21   Ngetich, [name] C, NP  insulin detemir (LEVEMIR FLEXPEN) 100 UNIT/ML FlexPen Inject 40 Units into the skin at bedtime.    [provider]  Insulin Pen Needle 32G X 4 MM MISC Use to inject insulin twice daily. Dx: E11.22 09/11/21   Ngetich, [name] C, NP  isosorbide mononitrate (IMDUR) 60 MG 24 hr tablet Take 60 mg by mouth daily. 10/21/21   [provider]  ketoconazole (NIZORAL) 2 % cream Apply 1 application topically as needed for irritation. 03/16/21   Ngetich, DiNelda BucksNP  Lancet Device MISC 1 Device by Does not apply route daily. 06/16/21   Ngetich, [name] C, NP  Lancets MISC 1 Device by Does not apply route as directed. Use as directed. 06/23/21   Ngetich, [name] C, NP  Loperamide HCl (IMODIUM PO) Take 3 mg by mouth 3 (three) times daily with meals.    [provider]  losartan (COZAAR) 25 MG tablet Take 25 mg by mouth daily.    [provider]  NYSTATIN powder Apply 1 application  topically daily as needed (Yeast infection to afffected area). 05/03/21   [provider]  OVER THE COUNTER MEDICATION Apply 1 Application topically every other day. Head and shoulder medicated shampoo  alternate with selsun blue medicated    [provider]  OXYGEN Inhale 2 L into the lungs continuous.    [provider]  potassium chloride SA (KLOR-CON M) 20 MEQ tablet Take 20 mEq by mouth 2 (two) times daily.    [provider]  Red Yeast Rice 600 MG CAPS Take 1,200 mg by mouth daily.    [provider]  rosuvastatin (CRESTOR) 10 MG tablet Take 1 tablet by mouth daily. 12/14/15   [provider]  topiramate (TOPAMAX) 25 MG tablet TAKE 1  TABLET BY MOUTH EVERY DAY 10/24/21   Ngetich, [name] C, NP  traMADol (ULTRAM) 50 MG tablet TAKE 1 TABLET(50 MG) BY MOUTH TWICE DAILY AS NEEDED 10/03/21   Ngetich, [name] C, NP  traZODone (DESYREL) 50 MG tablet Take 2 tablets (100 mg total) by mouth at bedtime. 08/03/21   Lauree Chandler, NP  triamcinolone cream (KENALOG) 0.1 % Apply 1 application. topically 2 (two) times daily. Apply to affected areas on shin area 06/30/21   Ngetich, [name] C, NP  TURMERIC PO Take 150 mg by mouth daily.    [provider]  VICTOZA 18 MG/3ML SOPN ADMINISTER 1.8 MG UNDER THE SKIN EVERY DAY 06/28/21   Ngetich, [name] C, NP  Vitamin D, Ergocalciferol, (DRISDOL) 1.25 MG (50000 UNIT) CAPS capsule Take 50,000 Units by mouth every 7 (seven) days.    [provider]    Physical Exam: Vitals:   11/01/21 1020 11/01/21 1123 11/01/21 1532 11/01/21 1642  BP: (!) 120/43 (!) 145/65 (!) 127/43 (!) 119/58  Pulse: 85 71 75 72  Resp: _0 Temp:  98.6 F (37 C) 98 F (36.7 C)   TempSrc:  Oral Oral   SpO2: 100% 100% 99% 97%   General:  Appears calm and comfortable and is in NAD Eyes:  PERRL, EOMI, normal lids, iris ENT:  grossly normal hearing, lips & tongue, mmm; appropriate dentition Neck:  no LAD, masses or thyromegaly; no carotid bruits Cardiovascular:  RRR, no m/r/g. No LE edema.  Respiratory:   CTA bilaterally with no wheezes/rales/rhonchi.  Normal respiratory effort. Abdomen:  soft, NT, ND, NABS. Large hernia on left  lower abdomen. 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 Strength 5/5 in both BLE/BUE Lower extremity:  No LE edema.  Limited foot exam with no ulcerations.  2+ distal pulses. Psychiatric:  grossly normal mood and affect, speech fluent and appropriate, AOx3 Neurologic:  CN 2-12 grossly intact (weakness of right upper eyelid) , moves all extremities in coordinated fashion, sensation intact. HTK intact bilaterally, FTN intact bilaterally.    Radiological Exams on Admission: Independently reviewed - see discussion in A/P where applicable  CT HEAD WO CONTRAST  Result Date: 10/31/2021 CLINICAL DATA:  Headache and weakness. EXAM: CT HEAD WITHOUT CONTRAST TECHNIQUE: Contiguous axial images were obtained from the base of the skull through the vertex without intravenous contrast. RADIATION DOSE REDUCTION: This exam was performed according to the departmental dose-optimization program which includes automated exposure control, adjustment of the mA and/or kV according to patient size and/or use of iterative reconstruction technique. COMPARISON:  None Available. FINDINGS: Brain: There is mild cerebral atrophy with widening of the extra-axial spaces and ventricular dilatation. There are areas of decreased attenuation within the white matter tracts of the supratentorial brain, consistent with microvascular disease changes. Vascular: No hyperdense vessel or unexpected calcification. Skull: Normal. Negative for fracture or focal lesion. Sinuses/Orbits: No acute finding. Other: None. IMPRESSION: Generalized cerebral atrophy and chronic white matter small vessel ischemic changes without evidence of an acute intracranial abnormality. Electronically Signed   By: Virgina Norfolk M.D.   On: 10/31/2021 19:40    EKG: Independently reviewed.  NSR -AV dual paced with rate 72; nonspecific  ST changes with no evidence of acute ischemia   Labs on Admission: I have personally reviewed the available labs and imaging studies at the time of the  admission.  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 CT head with no acute finding  -place in observation on telemetry for TIA/stroke work-up -Neurochecks per protocol -Neurology consulted -MRI brain without contrast/MRA brain to be done tomorrow due to PPM -echo in 04/2021 with poor shunt visualization, echo with bubble ordered  -Continue daily aspirin 81 mg and start Plavix 75 mg x 3 weeks neurology recommendation -carotid US -a1c/lipid panel  -N.p.o. until bedside swallow screen -PT/ OT   Type 2 diabetes mellitus ([place]) Appears well controlled. 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 Jude Dual Chamber PPM on 09/15/2021 by Dr. Caryl Comes  Essential hypertension Continue lasix, cozaar and imdur   Hyperlipidemia Continue crestor 56m daily  LDL to goal in 4/23 at 42   Chronic anemia hgb at baseline, continue oral iron   Chronic systolic congestive heart failure ([place] Echo 04/2021 EF of 60-65% with normal LVF. Diastolic function indeterminate  Strict I/O Continue medical  management with lasix, jardiance and cozaar   Chronic respiratory failure with hypoxia (HCC) Stable on home 2L oxygen   OSA (obstructive sleep apnea) Continue home cpap at night   Colostomy care ([facility] Ostomy care consult     Advance Care Planning:   Code Status: Full Code   Consults: neurology: Dr. SQuinn Axe wound care, PT/OT   DVT Prophylaxis: lovenox   Family Communication: none   Severity of Illness: The appropriate patient status for this patient is OBSERVATION. Observation status is judged to be reasonable and necessary in order to provide the required intensity of service to ensure the patient's safety. The patient's presenting symptoms, physical exam findings, and initial radiographic and laboratory data in the context of their medical condition is felt to place them at decreased risk for further clinical deterioration. Furthermore, it is anticipated that the patient will be medically stable for discharge from the hospital within 2 midnights of admission.   Author: AOrma Flaming MD 11/01/2021 5:26 PM  For on call review [email or web address]

## 2021-11-01 NOTE — Progress Notes (Signed)
Pt has home CPAP machine.  

## 2021-11-01 NOTE — Assessment & Plan Note (Addendum)
Ostomy care consult

## 2021-11-01 NOTE — Consult Note (Signed)
WOC consulted for ostomy Patient has had stoma since 1972; revision of stoma and moved to the RLQ Patient has hernia and creasing in his abdomen. He is independent in the care of his ostomy and reported by the bedside nurse to have changed it today.  Last time [facility] nursing team saw the patient he was getting 4 days wear time.   Uses 1pc convex pre-cut pouch  1 1/2" however we do not carry this inpatient.  He uses ostomy paste, ostomy powder, barrier strips and has had some issues with peristomal bleeding in the past but has managed them without any further issues.   I will provide the [facility] # for the most comparable supplies we have.   Kellie Simmering # [phone] 1pc convex drainable pouch Kellie Simmering # [phone] 2" barrier rings Kellie Simmering # 6 ostomy powder  We do not carry ostomy paste or barrier strips. Patient can sub barrier rings cut in half for strips.   Patient is independent in care; may need support with ostomy pouch change based on his current medical status.   Discussed POC with patient and bedside nurse.  Re consult if needed, will not follow at this time. Thanks  Derek Laughter [employer], RN,CWOCN, CNS, [place] [phone])

## 2021-11-01 NOTE — Assessment & Plan Note (Signed)
hgb at baseline, continue oral iron

## 2021-11-01 NOTE — Assessment & Plan Note (Addendum)
Continue crestor 10mg  daily  LDL to goal in 4/23 at 42

## 2021-11-01 NOTE — Assessment & Plan Note (Signed)
Stable on home 2L oxygen

## 2021-11-01 NOTE — Assessment & Plan Note (Signed)
Implantation of a St. Jude Dual Chamber PPM on 09/15/2021 by Dr. Caryl Comes

## 2021-11-01 NOTE — Assessment & Plan Note (Signed)
Appears well controlled. A1C in 05/2021 was 5.7 Repeat a1c with stroke w/u  Continue levemir, jardiance SSI and accuchecks qac/hs

## 2021-11-01 NOTE — ED Notes (Signed)
Jesse [name] with [facility] called back stating the truck will be re-directed to [facility] now instead of ED to ED  [facility] will confirm bed is ready. Jesse [name] at [place] called [facility] 3W and they stated they have a stat clean on the room

## 2021-11-01 NOTE — Assessment & Plan Note (Addendum)
Continue lasix, cozaar and imdur

## 2021-11-01 NOTE — ED Notes (Signed)
Pt. Is in no distress. He tells me that the drooling from the right side of his mouth has resolved. He states his only remaining symptom is of some "numbness of my lips". + for facial symmetry.

## 2021-11-01 NOTE — Assessment & Plan Note (Addendum)
71 year old with one week history of right sided facial sensory disturbance, increased right mouth drooling and right eye changes concerned for stroke. CT head with no acute finding  -place in observation on telemetry for TIA/stroke work-up -Neurochecks per protocol -Neurology consulted -MRI brain without contrast/MRA brain to be done tomorrow due to PPM -echo in 04/2021 with poor shunt visualization, echo with bubble ordered  -Continue daily aspirin 81 mg and start Plavix 75 mg x 3 weeks neurology recommendation -carotid US -a1c/lipid panel  -N.p.o. until bedside swallow screen -PT/ OT

## 2021-11-01 NOTE — ED Notes (Signed)
I enter his room to see that he is soundly sleeping. I allow him to continue to do so. Plan to perform complete assessment at 0800.

## 2021-11-01 NOTE — ED Notes (Signed)
I have just called report to [place], Therapist, sports at [facility].

## 2021-11-01 NOTE — Assessment & Plan Note (Addendum)
Echo 04/2021 EF of 60-65% with normal LVF. Diastolic function indeterminate  Strict I/O Continue medical management with lasix, jardiance and cozaar

## 2021-11-02 ENCOUNTER — Observation Stay (HOSPITAL_COMMUNITY): Payer: Medicare PPO

## 2021-11-02 ENCOUNTER — Ambulatory Visit (HOSPITAL_BASED_OUTPATIENT_CLINIC_OR_DEPARTMENT_OTHER): Payer: Medicare PPO

## 2021-11-02 DIAGNOSIS — D6489 Other specified anemias: Secondary | ICD-10-CM | POA: Diagnosis not present

## 2021-11-02 DIAGNOSIS — I5022 Chronic systolic (congestive) heart failure: Secondary | ICD-10-CM | POA: Diagnosis not present

## 2021-11-02 DIAGNOSIS — I5042 Chronic combined systolic (congestive) and diastolic (congestive) heart failure: Secondary | ICD-10-CM | POA: Diagnosis not present

## 2021-11-02 DIAGNOSIS — Z433 Encounter for attention to colostomy: Secondary | ICD-10-CM

## 2021-11-02 DIAGNOSIS — G4733 Obstructive sleep apnea (adult) (pediatric): Secondary | ICD-10-CM

## 2021-11-02 DIAGNOSIS — Z7984 Long term (current) use of oral hypoglycemic drugs: Secondary | ICD-10-CM

## 2021-11-02 DIAGNOSIS — N183 Chronic kidney disease, stage 3 unspecified: Secondary | ICD-10-CM | POA: Diagnosis not present

## 2021-11-02 DIAGNOSIS — I639 Cerebral infarction, unspecified: Secondary | ICD-10-CM

## 2021-11-02 DIAGNOSIS — Z794 Long term (current) use of insulin: Secondary | ICD-10-CM

## 2021-11-02 DIAGNOSIS — Z20822 Contact with and (suspected) exposure to covid-19: Secondary | ICD-10-CM | POA: Diagnosis not present

## 2021-11-02 DIAGNOSIS — I495 Sick sinus syndrome: Secondary | ICD-10-CM | POA: Diagnosis not present

## 2021-11-02 DIAGNOSIS — Z9989 Dependence on other enabling machines and devices: Secondary | ICD-10-CM

## 2021-11-02 DIAGNOSIS — Z79899 Other long term (current) drug therapy: Secondary | ICD-10-CM

## 2021-11-02 DIAGNOSIS — I110 Hypertensive heart disease with heart failure: Secondary | ICD-10-CM

## 2021-11-02 DIAGNOSIS — R200 Anesthesia of skin: Secondary | ICD-10-CM | POA: Diagnosis not present

## 2021-11-02 DIAGNOSIS — I130 Hypertensive heart and chronic kidney disease with heart failure and stage 1 through stage 4 chronic kidney disease, or unspecified chronic kidney disease: Secondary | ICD-10-CM | POA: Diagnosis not present

## 2021-11-02 DIAGNOSIS — Z23 Encounter for immunization: Secondary | ICD-10-CM | POA: Diagnosis not present

## 2021-11-02 DIAGNOSIS — Z9981 Dependence on supplemental oxygen: Secondary | ICD-10-CM

## 2021-11-02 DIAGNOSIS — I10 Essential (primary) hypertension: Secondary | ICD-10-CM

## 2021-11-02 DIAGNOSIS — R0989 Other specified symptoms and signs involving the circulatory and respiratory systems: Secondary | ICD-10-CM | POA: Diagnosis not present

## 2021-11-02 DIAGNOSIS — E1122 Type 2 diabetes mellitus with diabetic chronic kidney disease: Secondary | ICD-10-CM | POA: Diagnosis not present

## 2021-11-02 DIAGNOSIS — Z87891 Personal history of nicotine dependence: Secondary | ICD-10-CM

## 2021-11-02 DIAGNOSIS — J9611 Chronic respiratory failure with hypoxia: Secondary | ICD-10-CM

## 2021-11-02 DIAGNOSIS — E119 Type 2 diabetes mellitus without complications: Secondary | ICD-10-CM | POA: Diagnosis not present

## 2021-11-02 DIAGNOSIS — E785 Hyperlipidemia, unspecified: Secondary | ICD-10-CM

## 2021-11-02 DIAGNOSIS — Z950 Presence of cardiac pacemaker: Secondary | ICD-10-CM | POA: Diagnosis not present

## 2021-11-02 LAB — GLUCOSE, CAPILLARY
Glucose-Capillary: 186 mg/dL — ABNORMAL HIGH (ref 70–99)
Glucose-Capillary: 210 mg/dL — ABNORMAL HIGH (ref 70–99)

## 2021-11-02 LAB — ECHOCARDIOGRAM COMPLETE BUBBLE STUDY
Area-P 1/2: 2.19 cm2
Calc EF: 71.7 %
S' Lateral: 3.2 cm
Single Plane A2C EF: 63.5 %
Single Plane A4C EF: 77.3 %

## 2021-11-02 LAB — LIPID PANEL
Cholesterol: 126 mg/dL (ref 0–200)
HDL: 40 mg/dL — ABNORMAL LOW (ref >40)
LDL Cholesterol: 72 mg/dL (ref 0–99)
Total CHOL/HDL Ratio: 3.2 RATIO
Triglycerides: 72 mg/dL (ref <150)
VLDL: 14 mg/dL (ref 0–40)

## 2021-11-02 LAB — HEMOGLOBIN A1C
Hgb A1c MFr Bld: 6.8 % — ABNORMAL HIGH (ref 4.8–5.6)
Mean Plasma Glucose: 148.46 mg/dL

## 2021-11-02 MED ORDER — ASPIRIN 81 MG PO TBEC: 81.0000 mg | DELAYED_RELEASE_TABLET | Freq: Every day | ORAL | 0 refills | Status: AC

## 2021-11-02 MED ORDER — CLOPIDOGREL BISULFATE 75 MG PO TABS: 75.0000 mg | ORAL_TABLET | Freq: Every day | ORAL | 3 refills | Status: DC

## 2021-11-02 MED ORDER — PERFLUTREN LIPID MICROSPHERE
1.0000 mL | INTRAVENOUS | Status: AC | PRN
  Administered 2021-11-02: 4 mL via INTRAVENOUS

## 2021-11-02 NOTE — Evaluation (Signed)
Physical Therapy Evaluation  Patient Details Name: Jesse [name] MRN: [id number] DOB: 09-09-1950 Today's Date: 11/02/2021  History of Present Illness  Pt is a 71 y/o male who presents 11/01/2021 with R facial droop, R side weakness. Pt reports he is typically wheelchair bound but is able to walk short distances with the rollator. CT negative for acute changes. PMH significant for morbid obesity, CKD III, chronic hypoxic resp failure on 2L O2, HTN, colectomy and colostomy, neuropathy, R knee surgery (no date listed), pacemaker implant.   Clinical Impression  Pt admitted with above diagnosis. Pt currently with functional limitations due to the deficits listed below (see PT Problem List). At the time of PT eval pt was able to perform transfers with gross supervision for safety. At baseline, pt only ambulates with therapy and is otherwise in his power chair and functioning at a mod I level at home. Pt with mild R sided weakness but anticipate he is likely near baseline. Recommend pt resumes HH therapies to address new deficits to return to PLOF. Acutely, pt will benefit from skilled PT to increase their independence and safety with mobility to allow discharge to the venue listed below.          Recommendations for follow up therapy are one component of a multi-disciplinary discharge planning process, led by the attending physician.  Recommendations may be updated based on patient status, additional functional criteria and insurance authorization.  Follow Up Recommendations Home health PT (Resume HHPT with [name] - per pt previous script about to expire - will need new script for CVA)      Assistance Recommended at Discharge PRN  Patient can return home with the following  A little help with walking and/or transfers;A little help with bathing/dressing/bathroom;Assistance with cooking/housework;Assist for transportation;Help with stairs or ramp for entrance    Equipment Recommendations None  recommended by PT  Recommendations for Other Services       Functional Status Assessment Patient has had a recent decline in their functional status and demonstrates the ability to make significant improvements in function in a reasonable and predictable amount of time.     Precautions / Restrictions Precautions Precautions: Fall Restrictions Weight Bearing Restrictions: No      Mobility  Bed Mobility Overal bed mobility: Needs Assistance Bed Mobility: Supine to Sit     Supine to sit: Modified independent (Device/Increase time)     General bed mobility comments: No assist required. HOB elevated.    Transfers Overall transfer level: Needs assistance Equipment used: None Transfers: Bed to chair/wheelchair/BSC       Squat pivot transfers: Supervision     General transfer comment: Pt reaching out for 2 point stability on the chair to perform a partial stand and pivot around to the chair. Pt reports this is baseline how he transfers at home. Supervision and therapist holding the chair steady due to chair sliding despite being locked. Otherwise, no assist required.    Ambulation/Gait               General Gait Details: Did not progress to gait training at this time.  Stairs            Wheelchair Mobility    Modified Rankin (Stroke Patients Only) Modified Rankin (Stroke Patients Only) Pre-Morbid Rankin Score: Moderate disability Modified Rankin: Moderate disability     Balance Overall balance assessment: Mild deficits observed, not formally tested (Appears about baseline)  Pertinent Vitals/Pain Pain Assessment Pain Assessment: 0-10 Pain Score: 5  Pain Location: B hands - L trigger thumb and arthiritis in both hands.    Home Living Family/patient expects to be discharged to:: Private residence Living Arrangements: Alone   Type of Home: Apartment (Independent living) Home Access: Level  entry       Home Layout: One level Home Equipment: Tub bench;Grab bars - tub/shower;Grab bars - toilet;Hand held shower head;Rollator (4 wheels);Wheelchair - power Additional Comments: PT and a nurse coming out from home health    Prior Function Prior Level of Function : Independent/Modified Independent             Mobility Comments: Walks during therapy for exercise but when he is at home uses his power chair. Stand pivots from bed to chair ADLs Comments: Independent     Hand Dominance   Dominant Hand: Right    Extremity/Trunk Assessment   Upper Extremity Assessment Upper Extremity Assessment: Defer to OT evaluation    Lower Extremity Assessment Lower Extremity Assessment: RLE deficits/detail RLE Deficits / Details: Grossly 4- to 4/5 strength in quads, hamstrings, hip flexors. Pt endorses sensation changes in lower legs, however states feet feet grossly normal in impairment due to neuropathy. RLE Sensation: decreased light touch;decreased proprioception;history of peripheral neuropathy    Cervical / Trunk Assessment Cervical / Trunk Assessment: Other exceptions Cervical / Trunk Exceptions: Forward head posture with rounded shoulders  Communication   Communication: No difficulties  Cognition Arousal/Alertness: Awake/alert Behavior During Therapy: WFL for tasks assessed/performed Overall Cognitive Status: Within Functional Limits for tasks assessed                                          General Comments      Exercises     Assessment/Plan    PT Assessment Patient needs continued PT services  PT Problem List Decreased strength;Decreased range of motion;Decreased activity tolerance;Decreased balance;Decreased mobility;Decreased knowledge of use of DME;Decreased safety awareness;Decreased knowledge of precautions;Impaired sensation       PT Treatment Interventions DME instruction;Gait training;Stair training;Functional mobility  training;Therapeutic activities;Therapeutic exercise;Balance training;Neuromuscular re-education;Wheelchair mobility training;Patient/family education    PT Goals (Current goals can be found in the Care Plan section)  Acute Rehab PT Goals Patient Stated Goal: Home ASAP, continue with HHPT PT Goal Formulation: With patient Time For Goal Achievement: 11/16/21 Potential to Achieve Goals: Good    Frequency Min 2X/week     Co-evaluation PT/OT/SLP Co-Evaluation/Treatment: Yes Reason for Co-Treatment: Complexity of the patient's impairments (multi-system involvement);For patient/therapist safety;To address functional/ADL transfers PT goals addressed during session: Mobility/safety with mobility;Balance         AM-PAC PT "6 Clicks" Mobility  Outcome Measure Help needed turning from your back to your side while in a flat bed without using bedrails?: None Help needed moving from lying on your back to sitting on the side of a flat bed without using bedrails?: None Help needed moving to and from a bed to a chair (including a wheelchair)?: A Little Help needed standing up from a chair using your arms (e.g., wheelchair or bedside chair)?: A Little Help needed to walk in hospital room?: A Little Help needed climbing 3-5 steps with a railing? : Total 6 Click Score: 18    End of Session Equipment Utilized During Treatment: Oxygen (2L/min supplemental O2 (baseline)) Activity Tolerance: Patient tolerated treatment well Patient left: in chair;with call bell/phone  within reach;with chair alarm set Nurse Communication: Mobility status PT Visit Diagnosis: Other symptoms and signs involving the nervous system (R29.898)    Time: 6004-5997 PT Time Calculation (min) (ACUTE ONLY): 43 min   Charges:   PT Evaluation $PT Eval Moderate Complexity: 1 Mod PT Treatments $Therapeutic Activity: 8-22 mins        Rolinda Roan, PT, DPT Acute Rehabilitation Services Secure Chat Preferred Office:  [place] 11/02/2021, 12:58 PM

## 2021-11-02 NOTE — Progress Notes (Signed)
Carotid artery duplex completed. Refer to "CV Proc" under chart review to view preliminary results.  11/02/2021 2:27 PM Kelby Aline., MHA, RVT, RDCS, RDMS

## 2021-11-02 NOTE — Care Management Obs Status (Signed)
[facility] NOTIFICATION   Patient Details  Name: ROCHELLE NEPHEW MRN: [id number] Date of Birth: [birth year]   Medicare Observation Status Notification Given:  Yes    Pollie Friar, RN 11/02/2021, 4:08 PM

## 2021-11-02 NOTE — Progress Notes (Addendum)
STROKE TEAM PROGRESS NOTE   INTERVAL HISTORY No one is at the bedside.  Patient sitting up in chair having lunch. DAPT for 3 weeks, then ASA alone.   Vitals:   11/01/21 1642 11/01/21 2002 11/02/21 0002 11/02/21 0300  BP: (!) 119/58 (!) 146/98 (!) 101/45 (!) 98/56  Pulse: 72 83 73   Resp: 16 16 17    Temp:  98 F (36.7 C) 98 F (36.7 C) 98.7 F (37.1 C)  TempSrc:  Oral  Oral  SpO2: 97% 96% 94% 98%   CBC:  Recent Labs  Lab 10/31/21 1920  WBC 7.9  NEUTROABS 5.6  HGB 10.6*  HCT 32.6*  MCV 94.2  PLT 630   Basic Metabolic Panel:  Recent Labs  Lab 10/31/21 1920  NA 135  K 3.6  CL 93*  CO2 30  GLUCOSE 295*  BUN 43*  CREATININE 2.33*  CALCIUM 9.0   Lipid Panel:  Recent Labs  Lab 11/02/21 0451  CHOL 126  TRIG 72  HDL 40*  CHOLHDL 3.2  VLDL 14  LDLCALC 72   HgbA1c:  Recent Labs  Lab 11/02/21 0451  HGBA1C 6.8*   Urine Drug Screen:  Recent Labs  Lab 10/31/21 1920  LABOPIA NONE DETECTED  COCAINSCRNUR NONE DETECTED  LABBENZ NONE DETECTED  AMPHETMU NONE DETECTED  THCU NONE DETECTED  LABBARB NONE DETECTED    Alcohol Level  Recent Labs  Lab 10/31/21 1920  ETH <10    IMAGING past 24 hours No results found.  PHYSICAL EXAM Physical Exam  Constitutional: Appears well-developed and well-nourished.  Psych: Affect appropriate to situation Eyes: Normal external eye and conjunctiva. HENT: Normocephalic, no lesions, without obvious abnormality.   Musculoskeletal-no joint tenderness, deformity or swelling Cardiovascular: Normal rate and regular rhythm.  Respiratory: Effort normal, non-labored breathing saturations WNL GI: Soft.  No distension. There is no tenderness.  Skin: WDI   Neuro:  Mental Status: Alert, oriented, thought content appropriate.  Speech fluent without evidence of aphasia.  Able to follow 3 step commands without difficulty. Cranial Nerves: II: Visual fields grossly normal,  III,IV, VI: ptosis not present, extra-ocular motions  intact bilaterally pupils equal, round, reactive to light and accommodation V,VII: smile symmetric, facial light touch sensation normal bilaterally VIII: hearing normal bilaterally IX,X: uvula rises symmetrically XI: bilateral shoulder shrug XII: midline tongue extension Motor: Right : Upper extremity   5/5  Left:     Upper extremity   5/5  Lower extremity   5/5  Lower extremity   5/5 Tone and bulk:normal tone throughout; no atrophy noted Sensory: slightly decreased light touch right arm and leg Cerebellar: FNF intact Gait: deferred   ASSESSMENT/PLAN Jesse [name] is a 71 y.o. 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   Patient reports that he first felt that something was off about a week or 10 days ago.  He noted that he was drooling from right side of his mouth and had to wipe it off and.  He also feels like his upper lip is numb on the right and his right leg is weaker.  He got in touch with his family doctor and was encouraged to come to the ED for further evaluation.   No prior history of strokes, no family history of strokes.  He has not had  any alcohol or smoked any cigarettes since 1999.  Reports diabetes is now well controlled with his A1c is in the fives, reports his blood pressure is well controlled at home.  He is unsure what his last LDL was.   He reports that typically he is wheelchair-bound but is able to walk short distances with his rollator walker.  Endorses multitude of reasons for him being wheelchair-bound including clubfoot, diabetic neuropathy, pain.  Stroke vs TIA:     CT head No acute abnormality.  MRI  unable d/t pacemaker  at this time MRA  unable d/t pacemaker Carotid Doppler  normal. 2D Echo EF  70-75%, G1DD, no shunts LDL 72 HgbA1c 6.8 VTE prophylaxis - lovenox    Diet   Diet Carb Modified Fluid consistency: Thin; Room service appropriate? Yes   aspirin 81 mg daily prior to admission, now on aspirin 81 mg daily and clopidogrel 75 mg daily. ASA and plavix for 3 weeks and then plavix alone.  Therapy recommendations:  home health PT/OT Disposition:  home with [facility]  Hypertension Home meds:  losartan, imdur Stable Permissive hypertension (OK if < 220/120) but gradually normalize in 5-7 days Long-term BP goal normotensive  Hyperlipidemia Home meds:  crestor 10mg  daily, resumed in hospital LDL 72, goal < 70 Continue statin at discharge  Diabetes type II Controlled Home meds:  jardiance and levemir 40 units daily  HgbA1c 6.8, goal < 7.0 CBGs Recent Labs    11/01/21 1248 11/01/21 1701 11/01/21 2125  GLUCAP 229* 218* 273*     Other Stroke Risk Factors Advanced Age >/= 61  Former Cigarette smoker Obesity, There is no height or weight on file to calculate BMI., BMI >/= 30 associated with increased stroke risk, recommend weight loss, diet and exercise as appropriate  Obstructive sleep apnea, on CPAP at home   Other Active Problems neuropathy  Hospital day # 0 Laurey Morale, MSN, NP-C [facility] See AMION or use Epic Chat  ATTENDING ATTESTATION:  71 year old with likely CVA.  Unable to get MRI due to pacemaker and leads from different manufacture per MRI tech.  Carotid Doppler is negative.  DAPT therapy for 3 weeks then Plavix alone.  Neurology will sign off please call with questions.  Dr. Reeves Forth evaluated pt independently, reviewed imaging, chart, labs. Discussed and formulated plan with the Resident/APP. Changes were made to the note where appropriate. Please see APP/resident note above for details.     Navjot Loera,MD   To contact Stroke Continuity provider, please refer to [email or web address] After hours, contact General Neurology

## 2021-11-02 NOTE — Discharge Summary (Addendum)
Physician Discharge Summary   Patient: Jesse [name] MRN: [id number] DOB: 02/03/1951  Admit date:     10/31/2021  Discharge date: 11/02/21  Discharge Physician: Oswald Hillock   PCP: [name], NP   Recommendations at discharge:   Follow-up PCP in 2 weeks  Discharge Diagnoses: Principal Problem:   right facial numbness Active Problems:   Type 2 diabetes mellitus (HCC)   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    Essential hypertension   Hyperlipidemia   Chronic anemia   Chronic systolic congestive heart failure (HCC)   Chronic respiratory failure with hypoxia (HCC)   OSA (obstructive sleep apnea)   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 *  Hospital Course: 71 y.o. 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 He states for the past week he noticed that he kept having saliva on the right side of his mouth that required him to wipe his mouth frequently.  He also felt like the right side of his lip feels different than his left, but it's not numb per say. His right eye also kept "clouding up." Last night his primary doctor advised that he go to ER. At the ER, the EDP thought he had some right lower leg weakness on exam, but he has not noticed this on his own.  Assessment and Plan:   right facial numbness 71 year old with one week history of right sided facial sensory disturbance, increased right mouth drooling and right eye changes concerned for stroke. CT head with no acute finding  -MRI could not be obtained due to pacemaker in place -Carotid ultrasound was unremarkable -Echocardiogram showed EF of 60 to 65%, no source of embolus noted -Neurology  recommends to discharge patient on aspirin and Plavix for 3 weeks and then continue with plavix alone -Continue Crestor 10 mg daily    Type 2 diabetes mellitus ([place]) Appears well controlled. A1C in 05/2021 was 5.7 Repeat a1c is 6.8 -Continue home regimen  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  Implantation of a [employer] Jude Dual Chamber PPM on 09/15/2021 by Dr. 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          Consultants: Neurology Procedures performed: Echocardiogram, carotid duplex Disposition: Home Diet recommendation:  Discharge Diet Orders (From admission, onward)     Start     Ordered   11/02/21 0000  Diet - low sodium heart healthy        11/02/21 1531           Cardiac diet DISCHARGE MEDICATION: Allergies as of 11/02/2021       Reactions   Tape Rash        Medication List     TAKE these medications    alfuzosin 10  MG 24 hr tablet Commonly known as: UROXATRAL TAKE 1 TABLET BY MOUTH EVERY DAY What changed: when to take this   ALPRAZolam 0.5 MG tablet Commonly known as: XANAX TAKE 1 TABLET(0.5 MG) BY MOUTH TWICE DAILY AS NEEDED What changed: See the new instructions.   aspirin EC 81 MG tablet Take 1 tablet (81 mg total) by mouth daily for 21 days. Take for 21 days and then stop taking aspirin.  Continue with Plavix Start taking on: November 03, 2021 What changed: additional instructions   clopidogrel 75 MG tablet Commonly known as: PLAVIX Take 1 tablet (75 mg total) by mouth daily. Start taking on:  November 03, 2021   cyanocobalamin 1000 MCG tablet Take 1,000 mcg by mouth daily.   DRY EYE RELIEF OP Place 1 drop into both eyes daily as needed (dry eyes).   DULoxetine 60 MG capsule Commonly known as: CYMBALTA Take 60 mg by mouth daily.   FeroSul 325 (65 FE) MG tablet Generic drug: ferrous sulfate TAKE 1 TABLET(325 MG) BY MOUTH DAILY WITH BREAKFAST What changed: See the new instructions.   furosemide 40 MG tablet Commonly known as: LASIX Take 80 mg by mouth in the morning and at bedtime.   gabapentin 100 MG capsule Commonly known as: NEURONTIN TAKE 1 CAPSULE(100 MG) BY MOUTH TWICE DAILY What changed: See the new instructions.   glucose blood test strip Use to test blood sugar once daily. Dx:E11.22   IMODIUM PO Take 6 mg by mouth 3 (three) times daily with meals.   Insulin Pen Needle 32G X 4 MM Misc Use to inject insulin twice daily. Dx: E11.22   isosorbide mononitrate 60 MG 24 hr tablet Commonly known as: IMDUR Take 60 mg by mouth daily.   Jardiance 25 MG Tabs tablet Generic drug: empagliflozin TAKE 1 TABLET BY MOUTH EVERY DAY What changed: how much to take   ketoconazole 2 % cream Commonly known as: NIZORAL Apply 1 application topically as needed for irritation.   Lancet Device Misc 1 Device by Does not apply route daily.   Lancets Misc 1 Device by Does not apply route as directed. Use as directed.   Levemir FlexPen 100 UNIT/ML FlexPen Generic drug: insulin detemir Inject 40 Units into the skin at bedtime.   losartan 25 MG tablet Commonly known as: COZAAR Take 25 mg by mouth daily.   nystatin powder Generic drug: nystatin Apply 1 application  topically daily as needed (Yeast infection to afffected area).   ONE TOUCH ULTRA MINI w/Device Kit Use to test blood sugar once daily. Dx:E11.22   OVER THE COUNTER MEDICATION Apply 1 Application topically every other day. Head and shoulder medicated shampoo alternate with selsun blue medicated    OXYGEN Inhale 2 L into the lungs continuous.   potassium chloride SA 20 MEQ tablet Commonly known as: KLOR-CON M Take 20 mEq by mouth 2 (two) times daily.   Red Yeast Rice 600 MG Caps Take 1,200 mg by mouth daily.   rosuvastatin 10 MG tablet Commonly known as: CRESTOR Take 10 mg by mouth daily.   topiramate 25 MG tablet Commonly known as: TOPAMAX TAKE 1 TABLET BY MOUTH EVERY DAY   traMADol 50 MG tablet Commonly known as: ULTRAM TAKE 1 TABLET(50 MG) BY MOUTH TWICE DAILY AS NEEDED What changed: See the new instructions.   traZODone 50 MG tablet Commonly known as: DESYREL Take 2 tablets (100 mg total) by mouth at bedtime.   triamcinolone cream 0.1 % Commonly known as: KENALOG Apply 1  application. topically 2 (two) times daily. Apply to affected areas on shin area What changed:  when to take this reasons to take this   TURMERIC PO Take 1,000 mg by mouth daily.   Victoza 18 MG/3ML Sopn Generic drug: liraglutide ADMINISTER 1.8 MG UNDER THE SKIN EVERY DAY What changed: See the new instructions.   Vitamin D (Ergocalciferol) 1.25 MG (50000 UNIT) Caps capsule Commonly known as: DRISDOL Take 50,000 Units by mouth every 7 (seven) days. Friday        Discharge Exam: There were no vitals filed for this visit. General-appears in no acute distress Heart-S1-S2, regular, no murmur auscultated Lungs-clear to auscultation bilaterally, no wheezing or crackles auscultated Abdomen-soft, nontender, no organomegaly Extremities-no edema in the lower extremities Neuro-alert, oriented x3, no focal deficit noted  Condition at discharge: good  The results of significant diagnostics from this hospitalization (including imaging, microbiology, ancillary and laboratory) are listed below for reference.   Imaging Studies: VAS US CAROTID  Result Date: 11/02/2021 Carotid Arterial Duplex Study Patient Name:  Jesse [name] [facility]  Date of Exam:   11/02/2021 Medical Rec #: [id number]          Accession #:    [id number] Date of Birth: 09-May-1950         Patient Gender: M Patient Age:   36 years Exam Location:  [facility] Procedure:      VAS US CAROTID Referring Phys: Orma Flaming --------------------------------------------------------------------------------  Indications:  CVA. Risk Factors: Hypertension, hyperlipidemia, Diabetes, past history of smoking. Performing Technologist: Maudry Mayhew MHA, RDMS, RVT, RDCS  Examination Guidelines: A complete evaluation includes B-mode imaging, spectral Doppler, color Doppler, and power Doppler as needed of all accessible portions of each vessel. Bilateral testing is considered an integral part of a complete examination. Limited examinations for reoccurring indications may be performed as noted.  Right Carotid Findings: +----------+--------+--------+--------+------------------+--------+           PSV cm/sEDV cm/sStenosisPlaque DescriptionComments +----------+--------+--------+--------+------------------+--------+ CCA Prox  80      9                                          +----------+--------+--------+--------+------------------+--------+ CCA Distal70      7                                          +----------+--------+--------+--------+------------------+--------+ ICA Prox  73      23                                         +----------+--------+--------+--------+------------------+--------+ ICA Distal110     28                                tortuous +----------+--------+--------+--------+------------------+--------+ ECA       202                                                +----------+--------+--------+--------+------------------+--------+ +----------+--------+-------+----------------+-------------------+           PSV cm/sEDV cmsDescribe  Arm Pressure (mmHG) +----------+--------+-------+----------------+-------------------+ Subclavian140            Multiphasic, WNL                     +----------+--------+-------+----------------+-------------------+ +---------+--------+--+--------+--+---------+ VertebralPSV cm/s59EDV cm/s14Antegrade +---------+--------+--+--------+--+---------+  Left Carotid Findings: +----------+--------+--------+--------+-----------------------+--------+           PSV cm/sEDV cm/sStenosisPlaque Description     Comments +----------+--------+--------+--------+-----------------------+--------+ CCA Prox  121     15              smooth and heterogenous         +----------+--------+--------+--------+-----------------------+--------+ CCA Distal88      18                                              +----------+--------+--------+--------+-----------------------+--------+ ICA Prox  82      20              smooth and homogeneous          +----------+--------+--------+--------+-----------------------+--------+ ICA Distal86      25                                              +----------+--------+--------+--------+-----------------------+--------+ ECA       165                                                     +----------+--------+--------+--------+-----------------------+--------+ +----------+--------+--------+----------------+-------------------+           PSV cm/sEDV cm/sDescribe        Arm Pressure (mmHG) +----------+--------+--------+----------------+-------------------+ Subclavian160             Multiphasic, WNL                    +----------+--------+--------+----------------+-------------------+ +---------+--------+--+--------+--+---------+ VertebralPSV cm/s65EDV cm/s15Antegrade +---------+--------+--+--------+--+---------+ No prior study  Summary: Right Carotid: The extracranial vessels were near-normal with only minimal wall                thickening or plaque. Left Carotid: The extracranial vessels were near-normal with only minimal wall               thickening or plaque. Vertebrals:  Bilateral vertebral  arteries demonstrate antegrade flow. Subclavians: Normal flow hemodynamics were seen in bilateral subclavian              arteries. *See table(s) above for measurements and observations.     Preliminary    ECHOCARDIOGRAM COMPLETE BUBBLE STUDY  Result Date: 11/02/2021    ECHOCARDIOGRAM REPORT   Patient Name:   Jesse [name] [facility] Date of Exam: 11/02/2021 Medical Rec #:  [id number]        Height:       72.0 in Accession #:    [id number]       Weight:       351.0 lb Date of Birth:  24-Aug-1950        BSA:          2.706 m Patient Age:    70 years         BP:  98/56 mmHg Patient Gender: M                HR:           77 bpm. Exam Location:  Inpatient Procedure: 2D Echo, Cardiac Doppler, Color Doppler, Intracardiac Opacification            Agent and Saline Contrast Bubble Study Indications:    Stroke 434.91 / I63.9  History:        Patient has prior history of Echocardiogram examinations, most                 recent 05/14/2021. Risk Factors:Hypertension, Diabetes,                 Dyslipidemia, Former Smoker, ETOH and Sleep Apnea.  Sonographer:    Greer Pickerel Referring Phys: [id number] Orma Flaming  Sonographer Comments: Patient is obese and Technically difficult study due to poor echo windows. IMPRESSIONS  1. Left ventricular ejection fraction, by estimation, is 70 to 75%. Left ventricular ejection fraction by 2D MOD biplane is 71.7 %. The left ventricle has hyperdynamic function. The left ventricle has no regional wall motion abnormalities. There is severe left ventricular hypertrophy. Left ventricular diastolic parameters are consistent with Grade I diastolic dysfunction (impaired relaxation).  2. Right ventricular systolic function is normal. The right ventricular size is normal.  3. Left atrial size was moderately dilated.  4. Right atrial size was moderately dilated.  5. The mitral valve was not well visualized. No evidence of mitral valve regurgitation.  6. The aortic valve was not well visualized. Aortic  valve regurgitation is not visualized.  7. Aortic dilatation noted. There is mild dilatation of the aortic root, measuring 43 mm.  8. Agitated saline contrast bubble study was negative, with no evidence of any interatrial shunt. Comparison(s): Changes from prior study are noted. 05/14/2021: LVEF 60-65%, aortic root dilated to 41 mm. FINDINGS  Left Ventricle: Left ventricular ejection fraction, by estimation, is 70 to 75%. Left ventricular ejection fraction by 2D MOD biplane is 71.7 %. The left ventricle has hyperdynamic function. The left ventricle has no regional wall motion abnormalities. Definity contrast agent was given IV to delineate the left ventricular endocardial borders. The left ventricular internal cavity size was normal in size. There is severe left ventricular hypertrophy. Left ventricular diastolic parameters are consistent with Grade I diastolic dysfunction (impaired relaxation). Indeterminate filling pressures. Right Ventricle: The right ventricular size is normal. No increase in right ventricular wall thickness. Right ventricular systolic function is normal. Left Atrium: Left atrial size was moderately dilated. Right Atrium: Right atrial size was moderately dilated. Pericardium: There is no evidence of pericardial effusion. Mitral Valve: The mitral valve was not well visualized. No evidence of mitral valve regurgitation. Tricuspid Valve: The tricuspid valve is not well visualized. Tricuspid valve regurgitation is trivial. Aortic Valve: The aortic valve was not well visualized. Aortic valve regurgitation is not visualized. Pulmonic Valve: The pulmonic valve was not well visualized. Pulmonic valve regurgitation is not visualized. Aorta: Aortic dilatation noted. There is mild dilatation of the aortic root, measuring 43 mm. Venous: The inferior vena cava was not well visualized. IAS/Shunts: No atrial level shunt detected by color flow Doppler. Agitated saline contrast was given intravenously to evaluate  for intracardiac shunting. Agitated saline contrast bubble study was negative, with no evidence of any interatrial shunt.  LEFT VENTRICLE PLAX 2D  Biplane EF (MOD) LVIDd:         3.85 cm         LV Biplane EF:   Left LVIDs:         3.20 cm                          ventricular LV PW:         1.90 cm                          ejection LV IVS:        1.60 cm                          fraction by LVOT diam:     2.00 cm                          2D MOD LV SV:         94                               biplane is LV SV Index:   35                               71.7 %. LVOT Area:     3.14 cm                                Diastology                                LV e' medial:    7.83 cm/s LV Volumes (MOD)               LV E/e' medial:  12.2 LV vol d, MOD    85.8 ml       LV e' lateral:   5.00 cm/s A2C:                           LV E/e' lateral: 19.1 LV vol d, MOD    99.1 ml A4C: LV vol s, MOD    31.3 ml A2C: LV vol s, MOD    22.5 ml A4C: LV SV MOD A2C:   54.5 ml LV SV MOD A4C:   99.1 ml LV SV MOD BP:    68.6 ml RIGHT VENTRICLE RV S prime:     12.50 cm/s TAPSE (M-mode): 2.1 cm LEFT ATRIUM              Index        RIGHT ATRIUM           Index LA diam:        4.60 cm  1.70 cm/m   RA Area:     31.60 cm LA Vol (A2C):   119.0 ml 43.98 ml/m  RA Volume:   113.00 ml 41.76 ml/m LA Vol (A4C):   102.0 ml 37.70 ml/m LA Biplane Vol: 111.0 ml 41.02 ml/m  AORTIC VALVE LVOT Vmax:   148.00 cm/s LVOT Vmean:  86.700 cm/s LVOT VTI:    0.298 m  AORTA Ao Root  diam: 4.50 cm Ao Asc diam:  3.60 cm MITRAL VALVE MV Area (PHT): 2.19 cm    SHUNTS MV Decel Time: 347 msec    Systemic VTI:  0.30 m MV E velocity: 95.40 cm/s  Systemic Diam: 2.00 cm MV A velocity: 94.80 cm/s MV E/A ratio:  1.01 Lyman Bishop MD Electronically signed by Lyman Bishop MD Signature Date/Time: 11/02/2021/2:03:56 PM    Final    CT HEAD WO CONTRAST (5MM)  Result Date: 11/02/2021 CLINICAL DATA:  Stroke, follow up unable to have MRI d/t pacemaker. EXAM:  CT HEAD WITHOUT CONTRAST TECHNIQUE: Contiguous axial images were obtained from the base of the skull through the vertex without intravenous contrast. RADIATION DOSE REDUCTION: This exam was performed according to the departmental dose-optimization program which includes automated exposure control, adjustment of the mA and/or kV according to patient size and/or use of iterative reconstruction technique. COMPARISON:  CT head 10/31/2021. FINDINGS: Brain: No evidence of acute large vascular territory infarction, hemorrhage, hydrocephalus, extra-axial collection or mass lesion/mass effect. Vascular: No definite hyperdense vessel identified. Skull: No acute fracture. Sinuses/Orbits: Clear sinuses.  No acute orbital findings. Other: No mastoid effusions. IMPRESSION: No evidence of acute intracranial abnormality. Electronically Signed   By: Margaretha Sheffield M.D.   On: 11/02/2021 13:24   CT HEAD WO CONTRAST  Result Date: 10/31/2021 CLINICAL DATA:  Headache and weakness. EXAM: CT HEAD WITHOUT CONTRAST TECHNIQUE: Contiguous axial images were obtained from the base of the skull through the vertex without intravenous contrast. RADIATION DOSE REDUCTION: This exam was performed according to the departmental dose-optimization program which includes automated exposure control, adjustment of the mA and/or kV according to patient size and/or use of iterative reconstruction technique. COMPARISON:  None Available. FINDINGS: Brain: There is mild cerebral atrophy with widening of the extra-axial spaces and ventricular dilatation. There are areas of decreased attenuation within the white matter tracts of the supratentorial brain, consistent with microvascular disease changes. Vascular: No hyperdense vessel or unexpected calcification. Skull: Normal. Negative for fracture or focal lesion. Sinuses/Orbits: No acute finding. Other: None. IMPRESSION: Generalized cerebral atrophy and chronic white matter small vessel ischemic changes  without evidence of an acute intracranial abnormality. Electronically Signed   By: Virgina Norfolk M.D.   On: 10/31/2021 19:40   IR THORACENTESIS ASP PLEURAL SPACE W/IMG GUIDE  Result Date: 10/06/2021 INDICATION: Pleural effusion EXAM: ULTRASOUND GUIDED LEFT THORACENTESIS MEDICATIONS: None. COMPLICATIONS: None immediate. PROCEDURE: An ultrasound guided thoracentesis was thoroughly discussed with the patient and questions answered. The benefits, risks, alternatives and complications were also discussed. The patient understands and wishes to proceed with the procedure. Written consent was obtained. Ultrasound was performed to localize and mark an adequate pocket of fluid in the LEFT chest. The area was then prepped and draped in the normal sterile fashion. 1% Lidocaine was used for local anesthesia. Under ultrasound guidance a 6 Fr Safe-T-Centesis catheter was introduced. Thoracentesis was performed. The catheter was removed and a dressing applied. FINDINGS: A total of approximately 550 mL of pleural fluid was removed. Samples were sent to the laboratory as requested by the clinical team. IMPRESSION: Successful ultrasound guided LEFT thoracentesis yielding 550 mL of pleural fluid. Read and performed by: Alexandria Lodge, PA-C Electronically Signed   By: Michaelle Birks M.D.   On: 10/06/2021 18:00   DG Chest 1 View  Result Date: 10/06/2021 CLINICAL DATA:  Pleural effusion, hypotension weakness after thoracentesis EXAM: CHEST  1 VIEW COMPARISON:  09/07/2021 FINDINGS: Single frontal view of the chest demonstrates near complete  resolution of the left pleural effusion after interval thoracentesis. Minimal residual consolidation at the left lung base likely reflects atelectasis. No evidence of pneumothorax. Cardiac silhouette is enlarged but stable, with dual lead pacer unchanged. IMPRESSION: 1. Near complete resolution of left pleural effusion after thoracentesis. No evidence of pneumothorax. 2. Residual left  basilar consolidation likely reflects atelectasis. Electronically Signed   By: Randa Ngo M.D.   On: 10/06/2021 09:56    Microbiology: Results for orders placed or performed during the hospital encounter of 10/31/21  Resp Panel by RT-PCR (Flu A&B, Covid) Anterior Nasal Swab     Status: None   Collection Time: 10/31/21  7:20 PM   Specimen: Anterior Nasal Swab  Result Value Ref Range Status   SARS Coronavirus 2 by RT PCR NEGATIVE NEGATIVE Final    Comment: (NOTE) SARS-CoV-2 target nucleic acids are NOT DETECTED.  The SARS-CoV-2 RNA is generally detectable in upper respiratory specimens during the acute phase of infection. The lowest concentration of SARS-CoV-2 viral copies this assay can detect is 138 copies/mL. A negative result does not preclude SARS-Cov-2 infection and should not be used as the sole basis for treatment or other patient management decisions. A negative result may occur with  improper specimen collection/handling, submission of specimen other than nasopharyngeal swab, presence of viral mutation(s) within the areas targeted by this assay, and inadequate number of viral copies(<138 copies/mL). A negative result must be combined with clinical observations, patient history, and epidemiological information. The expected result is Negative.  Fact Sheet for Patients:  EntrepreneurPulse.com.au  Fact Sheet for Healthcare Providers:  IncredibleEmployment.be  This test is no t yet approved or cleared by the [place] FDA and  has been authorized for detection and/or diagnosis of SARS-CoV-2 by FDA under an Emergency Use Authorization (EUA). This EUA will remain  in effect (meaning this test can be used) for the duration of the COVID-19 declaration under Section 564(b)(1) of the Act, 21 U.S.C.section 360bbb-3(b)(1), unless the authorization is terminated  or revoked sooner.       Influenza A by PCR NEGATIVE NEGATIVE Final    Influenza B by PCR NEGATIVE NEGATIVE Final    Comment: (NOTE) The Xpert Xpress SARS-CoV-2/FLU/RSV plus assay is intended as an aid in the diagnosis of influenza from Nasopharyngeal swab specimens and should not be used as a sole basis for treatment. Nasal washings and aspirates are unacceptable for Xpert Xpress SARS-CoV-2/FLU/RSV testing.  Fact Sheet for Patients: EntrepreneurPulse.com.au  Fact Sheet for Healthcare Providers: IncredibleEmployment.be  This test is not yet approved or cleared by the [place] FDA and has been authorized for detection and/or diagnosis of SARS-CoV-2 by FDA under an Emergency Use Authorization (EUA). This EUA will remain in effect (meaning this test can be used) for the duration of the COVID-19 declaration under Section 564(b)(1) of the Act, 21 U.S.C. section 360bbb-3(b)(1), unless the authorization is terminated or revoked.  Performed at [facility], [address] [place], [place] [address]     Labs: CBC: Recent Labs  Lab 10/31/21 1920  WBC 7.9  NEUTROABS 5.6  HGB 10.6*  HCT 32.6*  MCV 94.2  PLT 259   Basic Metabolic Panel: Recent Labs  Lab 10/31/21 1920  NA 135  K 3.6  CL 93*  CO2 30  GLUCOSE 295*  BUN 43*  CREATININE 2.33*  CALCIUM 9.0   Liver Function Tests: Recent Labs  Lab 10/31/21 1920  AST 13*  ALT 11  ALKPHOS 70  BILITOT 0.4  PROT 7.2  ALBUMIN 3.8   CBG: Recent Labs  Lab 11/01/21 0047 11/01/21 1248 11/01/21 1701 11/01/21 2125 11/02/21 1139  GLUCAP 171* 229* 218* 273* 186*    Discharge time spent: greater than 30 minutes.  Signed: Oswald Hillock, MD [facility] 11/02/2021

## 2021-11-02 NOTE — TOC Transition Note (Signed)
Transition of Care [facility]) - CM/SW Discharge Note   Patient Details  Name: KAYDENN MCLEAR MRN: [id number] Date of Birth: 1950-06-15  Transition of Care [facility] CM/SW Contact:  Pollie Friar, RN Phone Number: 11/02/2021, 4:08 PM   Clinical Narrative:    Pt discharging home with resumption of home health services through [facility].  Pt lives in [place] at [facility]. He has: oxygen through [employer], wheelchair, rollator, shower seat, CPAP.  Pt uses either [facility] for transportation or his daughter has his handicap [name].  Pt needing PTAR home. CM has arranged transport and updated the bedside RN.    Final next level of care: Home w Home Health Services Barriers to Discharge: No Barriers Identified   Patient Goals and CMS Choice   CMS Medicare.gov Compare Post Acute Care list provided to:: Patient Choice offered to / list presented to : Patient  Discharge Placement                       Discharge Plan and Services                          HH Arranged: PT, OT, RN [facility] Agency: [place] Date [facility] Agency Contacted: 11/02/21   Representative spoke with at [place]: [place] ([place]) Interventions     Readmission Risk Interventions    05/15/2021    2:21 PM  Readmission Risk Prevention Plan  Transportation Screening Complete  PCP or Specialist Appt within 3-5 Days Complete  HRI or [place] Complete  Social Work Consult for [place] Planning/Counseling Complete  Palliative Care Screening Not Applicable  Medication Review Press photographer) Complete

## 2021-11-02 NOTE — Progress Notes (Signed)
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  Per Cardiology and our guidelines, we do not scan mismatched devices.  Please contact MRI with any further questions.

## 2021-11-02 NOTE — Progress Notes (Signed)
Echocardiogram 2D Echocardiogram has been performed.  Fidel Levy 11/02/2021, 11:51 AM

## 2021-11-02 NOTE — Progress Notes (Signed)
Mobility Specialist: Progress Note   11/02/21 1602  Mobility  Activity Refused mobility   Pt refused mobility stating he is supposed to discharge soon.   [facility] Giovonnie Trettel Mobility Specialist Mobility Specialist [address]: [phone]

## 2021-11-02 NOTE — Evaluation (Signed)
Occupational Therapy Evaluation Patient Details Name: Jesse [name] MRN: [id number] DOB: 03-11-1950 Today's Date: 11/02/2021   History of Present Illness Pt is a 71 y/o male who presents 11/01/2021 with R facial droop, R side weakness. Pt reports he is typically wheelchair bound but is able to walk short distances with the rollator. CT negative for acute changes. PMH significant for morbid obesity, CKD III, chronic hypoxic resp failure on 2L O2, HTN, colectomy and colostomy, neuropathy, R knee surgery (no date listed), pacemaker implant.   Clinical Impression   Pt admitted with above diagnosis with deficits listed below. Pt performing transfers and LB dressing with supervision for safety. Pt with skill to self-initiate ostomy care this session. Pt with weakness of RUE as compared to LUE and decreased as compared to baseline, but overall function seems to be near baseline. Pt continues with weakness in facial musculature as well, but no additional drooling observed. Recommending HHOT to optimize safety in the home setting, but pt may decline as his daughter is an OT who has previously educated him on AE, etc. Will continue to follow acutely.      Recommendations for follow up therapy are one component of a multi-disciplinary discharge planning process, led by the attending physician.  Recommendations may be updated based on patient status, additional functional criteria and insurance authorization.   Follow Up Recommendations  Home health OT (Pt may not want HHOT, as daughter is OT and HHOT would predominantly be for transfers and strengthening.)    Assistance Recommended at Discharge Set up Supervision/Assistance  Patient can return home with the following A little help with bathing/dressing/bathroom;Assistance with cooking/housework    Functional Status Assessment  Patient has had a recent decline in their functional status and demonstrates the ability to make significant improvements in  function in a reasonable and predictable amount of time.  Equipment Recommendations  None recommended by OT (pt has all recommended equipment)    Recommendations for Other Services       Precautions / Restrictions Precautions Precautions: Fall Restrictions Weight Bearing Restrictions: No      Mobility Bed Mobility Overal bed mobility: Needs Assistance Bed Mobility: Supine to Sit     Supine to sit: Modified independent (Device/Increase time)     General bed mobility comments: No assist required. HOB elevated.    Transfers Overall transfer level: Needs assistance Equipment used: None Transfers: Bed to chair/wheelchair/BSC     Squat pivot transfers: Supervision       General transfer comment: Pt reaching out for 2 point stability on the chair to perform a partial stand and pivot around to the chair. Pt reports this is baseline how he transfers at home. Supervision and therapist holding the chair steady due to chair sliding despite being locked. Otherwise, no assist required.      Balance Overall balance assessment: Mild deficits observed, not formally tested (appears near baseline)                                         ADL either performed or assessed with clinical judgement   ADL Overall ADL's : Needs assistance/impaired Eating/Feeding: Modified independent   Grooming: Set up;Sitting   Upper Body Bathing: Set up;Sitting   Lower Body Bathing: Set up;Sitting/lateral leans   Upper Body Dressing : Set up;Sitting   Lower Body Dressing: Set up;Supervision/safety;Sit to/from stand Lower Body Dressing Details (indicate cue type and  reason): Donning socks sitting EOB and bringing LE into bed to reach Toilet Transfer: Animal nutritionist Details (indicate cue type and reason): Performing squat pivot transfer to chair during session. Toileting- Clothing Manipulation and Hygiene: Set up;Sitting/lateral  lean Toileting - Clothing Manipulation Details (indicate cue type and reason): Pt performing ostomy care sitting EOB     Functional mobility during ADLs: Supervision/safety;Rollator (4 wheels) (Only performing strand pivot transfers at baseline) General ADL Comments: Near baseline functionally, but decreased strength R.     Vision Baseline Vision/History: 1 Wears glasses Ability to See in Adequate Light: 0 Adequate Patient Visual Report: Other (comment) (Pt repoorting difficulty holding eyes opened.) Vision Assessment?: Vision impaired- to be further tested in functional context Additional Comments: Pt able to read from phone and scanning environment to visually locate items in room. Reporting musculature surrounding R eye is newly weak (ability to nold eye open at times).     Perception     Praxis      Pertinent Vitals/Pain Pain Assessment Pain Assessment: 0-10 Pain Score: 5  Pain Location: B hands - L trigger thumb and arthiritis in both hands. Pain Descriptors / Indicators: Constant Pain Intervention(s): Monitored during session     Hand Dominance Right   Extremity/Trunk Assessment Upper Extremity Assessment Upper Extremity Assessment: RUE deficits/detail (BUE generally wek, but R weak as compared to L (4/5)) RUE Deficits / Details: 3+/5 grip, push/pull, decreased sensation along median nerve distribution   Lower Extremity Assessment Lower Extremity Assessment: Defer to PT evaluation RLE Deficits / Details: Grossly 4- to 4/5 strength in quads, hamstrings, hip flexors. Pt endorses sensation changes in lower legs, however states feet feet grossly normal in impairment due to neuropathy. RLE Sensation: decreased light touch;decreased proprioception;history of peripheral neuropathy   Cervical / Trunk Assessment Cervical / Trunk Assessment: Other exceptions Cervical / Trunk Exceptions: Forward head posture with rounded shoulders   Communication Communication Communication:  No difficulties   Cognition Arousal/Alertness: Awake/alert Behavior During Therapy: WFL for tasks assessed/performed Overall Cognitive Status: Within Functional Limits for tasks assessed                                 General Comments: Pt recalling times he was woken up by staff in the night, aware of AE and conversational regarding role of OT. Pt additionally with insight into need to perform ostomy care     General Comments  VSS.    Exercises     Shoulder Instructions      Home Living Family/patient expects to be discharged to:: Private residence Living Arrangements: Alone   Type of Home: Apartment (independent living) Home Access: Level entry     Home Layout: One level     Bathroom Shower/Tub: Occupational psychologist: Handicapped height Bathroom Accessibility: No   Home Equipment: Tub bench;Grab bars - tub/shower;Grab bars - toilet;Hand held shower head;Rollator (4 wheels);Wheelchair - power   Additional Comments: PT and a nurse coming out from home health      Prior Functioning/Environment Prior Level of Function : Independent/Modified Independent             Mobility Comments: Walks during therapy for exercise but when he is at home uses his power chair. Stand pivots from bed to chair ADLs Comments: Independent. Drope things frequently due to arthritis in hands and prior joint fusion in third digot on L hand. Pt daughter is an OT  OT Problem List: Decreased strength;Decreased activity tolerance;Decreased range of motion      OT Treatment/Interventions: Self-care/ADL training;Therapeutic exercise;DME and/or AE instruction;Therapeutic activities;Patient/family education;Balance training    OT Goals(Current goals can be found in the care plan section) Acute Rehab OT Goals Patient Stated Goal: Go home OT Goal Formulation: With patient Time For Goal Achievement: 11/16/21 Potential to Achieve Goals: Good  OT Frequency: Min  2X/week    Co-evaluation PT/OT/SLP Co-Evaluation/Treatment: Yes Reason for Co-Treatment: Complexity of the patient's impairments (multi-system involvement);For patient/therapist safety;To address functional/ADL transfers PT goals addressed during session: Mobility/safety with mobility;Balance OT goals addressed during session: ADL's and self-care;Strengthening/ROM      AM-PAC OT "6 Clicks" Daily Activity     Outcome Measure Help from another person eating meals?: None Help from another person taking care of personal grooming?: None Help from another person toileting, which includes using toliet, bedpan, or urinal?: None Help from another person bathing (including washing, rinsing, drying)?: None Help from another person to put on and taking off regular upper body clothing?: None Help from another person to put on and taking off regular lower body clothing?: None 6 Click Score: 24   End of Session Equipment Utilized During Treatment: Oxygen Nurse Communication: Mobility status  Activity Tolerance: Patient tolerated treatment well Patient left: in chair;with call bell/phone within reach;with chair alarm set  OT Visit Diagnosis: Unsteadiness on feet (R26.81);Muscle weakness (generalized) (M62.81)                Time: 1696-7893 OT Time Calculation (min): 25 min Charges:  OT General Charges $OT Visit: 1 Visit OT Evaluation $OT Eval Moderate Complexity: 1 Mod  Jesse [name], OTR/L [facility] Acute Rehabilitation Office: [phone]   Jesse [name] 11/02/2021, 1:37 PM

## 2021-11-02 NOTE — Consult Note (Addendum)
NEUROLOGY CONSULTATION NOTE   Date of service: November 02, 2021 Patient Name: Jesse [name] MRN:  [id number] DOB:  [birth year] Reason for consult: "R facial droop and R Leg weakness" Requesting Provider: Orma Flaming, MD _ _ _   _ __   _ __ _ _  __ __   _ __   __ _  History of Present Illness  Jesse [name] is a 71 y.o. 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  Patient reports that he first felt that something was off about a week or 10 days ago.  He noted that he was drooling from right side of his mouth and had to wipe it off and.  He also feels like his upper lip is numb on the right and his right leg is weaker.  He got in touch with his family doctor and was encouraged to come to the ED for further evaluation.  No prior history of strokes, no family history of strokes.  He has not had any alcohol or smoked any cigarettes since 1999.  Reports diabetes is now well controlled with his A1c is in the fives, reports his blood pressure is well controlled at home.  He is unsure what his last LDL was.  He reports that typically he is wheelchair-bound but is able to walk short distances with his rollator walker.  Endorses multitude of reasons for him being wheelchair-bound including clubfoot, diabetic neuropathy, pain.  LKW: 10/24/21 mRS: 2 tNKASE: Not offered as he is outside the window. Thrombectomy: Not offered as she is outside the window. NIHSS components Score: Comment  1a Level of Conscious 0[x]  1[]  2[]  3[]      1b LOC Questions 0[x]  1[]  2[]       1c LOC Commands 0[x]  1[]  2[]       2 Best Gaze 0[x]  1[]  2[]       3 Visual 0[x]  1[]  2[]  3[]      4 Facial Palsy 0[]  1[x]  2[]  3[]      5a Motor Arm - left 0[x]  1[]  2[]  3[]  4[]  UN[]    5b  Motor Arm - Right 0[x]  1[]  2[]  3[]  4[]  UN[]    6a Motor Leg - Left 0[x]  1[]  2[]  3[]  4[]  UN[]    6b Motor Leg - Right 0[x]  1[]  2[]  3[]  4[]  UN[]    7 Limb Ataxia 0[x]  1[]  2[]  3[]  UN[]     8 Sensory 0[x]  1[]  2[]  UN[]      9 Best Language 0[x]  1[]  2[]  3[]      10 Dysarthria 0[x]  1[]  2[]  UN[]      11 Extinct. and Inattention 0[x]  1[]  2[]       TOTAL: 1        ROS   Constitutional Denies weight loss, fever and chills.   HEENT Denies changes in vision and hearing.   Respiratory Denies SOB and cough.   CV Denies palpitations and CP   GI Denies abdominal pain, nausea, vomiting and diarrhea.   GU Denies dysuria and urinary frequency.   MSK Denies myalgia and joint pain.   Skin Denies rash and pruritus.   Neurological Denies headache and syncope.   Psychiatric Denies recent changes in mood. Denies anxiety and depression.    Past History   Past Medical History:  Diagnosis Date   Alcohol use disorder in remission  Anxiety and depression    At high risk for falls    Former cigarette smoker    Generalized anxiety disorder    Hard of hearing    Hyperlipidemia due to type 2 diabetes mellitus ([employer])    Hypertension    Morbid obesity ([place])    Neuropathy    O2 dependent    Risk for falls    Sleep apnea    Type 2 diabetes mellitus ([place])    Past Surgical History:  Procedure Laterality Date   APPENDECTOMY  1980   IR THORACENTESIS ASP PLEURAL SPACE W/IMG GUIDE  05/18/2021   IR THORACENTESIS ASP PLEURAL SPACE W/IMG GUIDE  10/06/2021   KNEE SURGERY Right    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   Procedure: PACEMAKER IMPLANT;  Surgeon: Deboraha Sprang, MD;  Location: [facility];  Service: Cardiovascular;  Laterality: N/A;   Family History  Problem Relation Age of Onset   Diabetes Brother    Bipolar disorder Daughter    Anxiety disorder Son    Social History   Socioeconomic History   Marital status: Divorced    Spouse name: Not on file   Number of children: 3   Years of education: Not  on file   Highest education level: Master's degree (e.g., MA, MS, MEng, MEd, MSW, MBA)  Occupational History   Occupation: Retired  Tobacco Use   Smoking status: Former    Packs/day: 1.00    Types: Cigarettes    Quit date: 1999    Years since quitting: 24.7   Smokeless tobacco: Never  Vaping Use   Vaping Use: Never used  Substance and Sexual Activity   Alcohol use: Not Currently   Drug use: Never   Sexual activity: Not on file  Other Topics Concern   Not on file  Social History Narrative   Tobacco use, amount per day now: 0   Past tobacco use, amount per day: 1 pack   How many years did you use tobacco: 10 last 1999   Alcohol use (drinks per week): 1/5 day last 1999   Diet:   Do you drink/eat things with caffeine: Yes   Marital status:   Divorced                               What year were you married? 1980   Do you live in a house, apartment, assisted living, condo, trailer, etc.?    Is it one or more stories? 1   How many persons live in your home? 1   Do you have pets in your home?( please list) Cat   Highest Level of education completed? Post Grad   Current or past profession: Insurance account manager.   Do you exercise? Little                                 Type and how often?   Do you have a living will? Yes   Do you have a DNR form?        No                           If not, do you want to discuss one?   Do you have signed POA/HPOA forms?   Yes  If so, please bring to you appointment      Do you have any difficulty bathing or dressing yourself? Yes   Do you have any difficulty preparing food or eating? No   Do you have any difficulty managing your medications? No   Do you have any difficulty managing your finances? No   Do you have any difficulty affording your medications?  No   Social Determinants of Health   Financial Resource Strain: Low Risk  (05/15/2021)   Overall Financial Resource Strain (CARDIA)    Difficulty of Paying Living Expenses: Not  hard at all  Food Insecurity: No Food Insecurity (05/15/2021)   Hunger Vital Sign    Worried About Running Out of Food in the Last Year: Never true    Ran Out of Food in the Last Year: Never true  Transportation Needs: No Transportation Needs (05/15/2021)   PRAPARE - Hydrologist (Medical): No    Lack of Transportation (Non-Medical): No  Physical Activity: Not on file  Stress: Not on file  Social Connections: Not on file   Allergies  Allergen Reactions   Tape Rash    Medications   Medications Prior to Admission  Medication Sig Dispense Refill Last Dose   alfuzosin (UROXATRAL) 10 MG 24 hr tablet TAKE 1 TABLET BY MOUTH EVERY DAY (Patient taking differently: Take 10 mg by mouth daily with breakfast.) 30 tablet 0 10/30/2021   ALPRAZolam (XANAX) 0.5 MG tablet TAKE 1 TABLET(0.5 MG) BY MOUTH TWICE DAILY AS NEEDED (Patient taking differently: Take 0.5 mg by mouth 2 (two) times daily as needed for anxiety or sleep.) 60 tablet 3 10/30/2021   aspirin EC 81 MG tablet Take 81 mg by mouth daily. Swallow whole.   10/31/2021   Carboxymethylcellulose Sodium (DRY EYE RELIEF OP) Place 1 drop into both eyes daily as needed (dry eyes).   unk   cyanocobalamin 1000 MCG tablet Take 1,000 mcg by mouth daily.   10/31/2021   DULoxetine (CYMBALTA) 60 MG capsule Take 60 mg by mouth daily.   10/31/2021   empagliflozin (JARDIANCE) 25 MG TABS tablet TAKE 1 TABLET BY MOUTH EVERY DAY (Patient taking differently: Take 25 mg by mouth daily.) 90 tablet 2 10/31/2021   ferrous sulfate (FEROSUL) 325 (65 FE) MG tablet TAKE 1 TABLET(325 MG) BY MOUTH DAILY WITH BREAKFAST (Patient taking differently: Take 325 mg by mouth daily with breakfast.) 30 tablet 5 10/31/2021   furosemide (LASIX) 40 MG tablet Take 80 mg by mouth in the morning and at bedtime.   10/31/2021   gabapentin (NEURONTIN) 100 MG capsule TAKE 1 CAPSULE(100 MG) BY MOUTH TWICE DAILY (Patient taking differently: Take 100 mg by mouth 2 (two) times  daily.) 60 capsule 5 10/31/2021   insulin detemir (LEVEMIR FLEXPEN) 100 UNIT/ML FlexPen Inject 40 Units into the skin at bedtime.   10/30/2021   isosorbide mononitrate (IMDUR) 60 MG 24 hr tablet Take 60 mg by mouth daily.   10/31/2021   ketoconazole (NIZORAL) 2 % cream Apply 1 application topically as needed for irritation. 15 g 5 unk   Loperamide HCl (IMODIUM PO) Take 6 mg by mouth 3 (three) times daily with meals.   10/31/2021   losartan (COZAAR) 25 MG tablet Take 25 mg by mouth daily.   10/31/2021   NYSTATIN powder Apply 1 application  topically daily as needed (Yeast infection to afffected area).   unk   OVER THE COUNTER MEDICATION Apply 1 Application topically every other day. Head and  shoulder medicated shampoo alternate with selsun blue medicated   Past Week   OXYGEN Inhale 2 L into the lungs continuous.      potassium chloride SA (KLOR-CON M) 20 MEQ tablet Take 20 mEq by mouth 2 (two) times daily.   10/31/2021   Red Yeast Rice 600 MG CAPS Take 1,200 mg by mouth daily.   10/31/2021   rosuvastatin (CRESTOR) 10 MG tablet Take 10 mg by mouth daily.   10/31/2021   topiramate (TOPAMAX) 25 MG tablet TAKE 1 TABLET BY MOUTH EVERY DAY (Patient taking differently: Take 25 mg by mouth daily.) 90 tablet 2 10/31/2021   traMADol (ULTRAM) 50 MG tablet TAKE 1 TABLET(50 MG) BY MOUTH TWICE DAILY AS NEEDED (Patient taking differently: Take 50 mg by mouth every 12 (twelve) hours as needed for moderate pain.) 60 tablet 3 10/31/2021   traZODone (DESYREL) 50 MG tablet Take 2 tablets (100 mg total) by mouth at bedtime. 60 tablet 3 10/30/2021   TURMERIC PO Take 1,000 mg by mouth daily.   10/31/2021   VICTOZA 18 MG/3ML SOPN ADMINISTER 1.8 MG UNDER THE SKIN EVERY DAY (Patient taking differently: Inject 1.8 mg into the skin daily.) 27 mL 5 10/31/2021   Vitamin D, Ergocalciferol, (DRISDOL) 1.25 MG (50000 UNIT) CAPS capsule Take 50,000 Units by mouth every 7 (seven) days. Friday   Past Month   Blood Glucose Monitoring Suppl (ONE  TOUCH ULTRA MINI) w/Device KIT Use to test blood sugar once daily. Dx:E11.22 1 kit 0    glucose blood test strip Use to test blood sugar once daily. Dx:E11.22 100 each 3    Insulin Pen Needle 32G X 4 MM MISC Use to inject insulin twice daily. Dx: E11.22 200 each 3    Lancet Device MISC 1 Device by Does not apply route daily. 1 each 11    Lancets MISC 1 Device by Does not apply route as directed. Use as directed. 100 each 2    triamcinolone cream (KENALOG) 0.1 % Apply 1 application. topically 2 (two) times daily. Apply to affected areas on shin area (Patient taking differently: Apply 1 application  topically 2 (two) times daily as needed (affected area). Apply to affected areas on shin area) 45 g 0      Vitals   Vitals:   11/01/21 1642 11/01/21 2002 11/02/21 0002 11/02/21 0300  BP: (!) 119/58 (!) 146/98 (!) 101/45 (!) 98/56  Pulse: 72 83 73   Resp: 16 16 17    Temp:  98 F (36.7 C) 98 F (36.7 C) 98.7 F (37.1 C)  TempSrc:  Oral  Oral  SpO2: 97% 96% 94% 98%     There is no height or weight on file to calculate BMI.  Physical Exam   General: Laying comfortably in bed; in no acute distress.  HENT: Normal oropharynx and mucosa. Normal external appearance of ears and nose.  Neck: Supple, no pain or tenderness  CV: No JVD. No peripheral edema.  Pulmonary: Symmetric Chest rise. Normal respiratory effort.  Abdomen: Soft to touch, non-tender.  Ext: No cyanosis, edema, or deformity  Skin: No rash. Normal palpation of skin.   Musculoskeletal: Normal digits and nails by inspection. No clubbing.   Neurologic Examination  Mental status/Cognition: Alert, oriented to self, place, month and year, good attention.  Speech/language: Fluent, comprehension intact, object naming intact, repetition intact.  Cranial nerves:   CN II Pupils equal and reactive to light, no VF deficits    CN III,IV,VI EOM intact, no gaze preference  or deviation, no nystagmus    CN V normal sensation in V1, V2, and V3  segments bilaterally    CN VII ?mild R facial droop   CN VIII normal hearing to speech    CN IX & X normal palatal elevation, no uvular deviation    CN XI 5/5 head turn and 5/5 shoulder shrug bilaterally    CN XII midline tongue protrusion    Motor:  Muscle bulk: normal, tone normal, pronator drift none tremor none Mvmt Root Nerve  Muscle Right Left Comments  SA C5/6 Ax Deltoid 5 5   EF C5/6 Mc Biceps 5 5   EE C6/7/8 Rad Triceps 5 5   WF C6/7 Med FCR     WE C7/8 PIN ECU     F Ab C8/T1 U ADM/FDI 5 5   HF L1/2/3 Fem Illopsoas 4 5   KE L2/3/4 Fem Quad     DF L4/5 D Peron Tib Ant 5 5   PF S1/2 Tibial Grc/Sol 5 5    Sensation:  Light touch Intact bilaterally   Pin prick    Temperature    Vibration   Proprioception    Coordination/Complex Motor:  - Finger to Nose intact bilaterally - Heel to shin intact bilaterally - Rapid alternating movement are normal - Gait: Deferred, he is wheelchair-bound at baseline.  Labs   CBC:  Recent Labs  Lab 10/31/21 1920  WBC 7.9  NEUTROABS 5.6  HGB 10.6*  HCT 32.6*  MCV 94.2  PLT 585    Basic Metabolic Panel:  Lab Results  Component Value Date   NA 135 10/31/2021   K 3.6 10/31/2021   CO2 30 10/31/2021   GLUCOSE 295 (H) 10/31/2021   BUN 43 (H) 10/31/2021   CREATININE 2.33 (H) 10/31/2021   CALCIUM 9.0 10/31/2021   GFRNONAA 29 (L) 10/31/2021   GFRAA (L) 04/08/2008    54        The eGFR has been calculated using the MDRD equation. This calculation has not been validated in all clinical situations. eGFR's persistently <60 mL/min signify possible Chronic Kidney Disease.   Lipid Panel:  Lab Results  Component Value Date   LDLCALC 42 06/13/2021   HgbA1c:  Lab Results  Component Value Date   HGBA1C 5.7 06/13/2021   Urine Drug Screen:     Component Value Date/Time   LABOPIA NONE DETECTED 10/31/2021 1920   COCAINSCRNUR NONE DETECTED 10/31/2021 1920   LABBENZ NONE DETECTED 10/31/2021 1920   AMPHETMU NONE DETECTED  10/31/2021 1920   THCU NONE DETECTED 10/31/2021 1920   LABBARB NONE DETECTED 10/31/2021 1920    Alcohol Level     Component Value Date/Time   ETH <10 10/31/2021 1920    CT Head without contrast(Personally reviewed): CTH was negative for a large hypodensity concerning for a large territory infarct or hyperdensity concerning for an ICH  MR Angio head without contrast and Carotid Duplex BL: pending  MRI Brain: pending  Impression   Jesse [name] is a 71 y.o. 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  His presentation is most concerning for a small vessel stroke.  Would not expect spinal cord lesion to cause facial droop.  Would not expect Bell's palsy to cause right leg weakness.  Primary Diagnosis:  Cerebral infarction,  unspecified.  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 - Recommend DVT ppx - SBP goal -gradual normotension. - Recommend Telemetry monitoring for arrythmia - Recommend bedside swallow screen prior to PO intake. - Stroke education booklet - Recommend PT/OT/SLP  consult ______________________________________________________________________   Thank you for the opportunity to take part in the care of this patient. If you have any further questions, please contact the neurology consultation attending.  Signed,  [place] Pager Number [phone] _ _ _   _ __   _ __ _ _  __ __   _ __   __ _

## 2021-11-02 NOTE — Inpatient Diabetes Management (Signed)
Inpatient Diabetes Program Recommendations  AACE/ADA: New Consensus Statement on Inpatient Glycemic Control (2015)  Target Ranges:  Prepandial:   less than 140 mg/dL      Peak postprandial:   less than 180 mg/dL (1-2 hours)      Critically ill patients:  140 - 180 mg/dL   Lab Results  Component Value Date   GLUCAP 186 (H) 11/02/2021   HGBA1C 6.8 (H) 11/02/2021    Review of Glycemic Control  Latest Reference Range & Units 11/01/21 00:47 11/01/21 12:48 11/01/21 17:01 11/01/21 21:25 11/02/21 11:39  Glucose-Capillary 70 - 99 mg/dL 171 (H) 229 (H) 218 (H) 273 (H) 186 (H)   Diabetes history: DM 2 Outpatient Diabetes medications: Jardiance 25 mg Daily, Levemir 40 units qhs, Victoza 1.8 mg Daily Current orders for Inpatient glycemic control:  Levemir 40 units qhs, Novolog 0-9 units tid Jardiance 25 mg Daily  A1c 6.8% on 9/14  Inpatient Diabetes Program Recommendations:    -  consider Novolog 3 units tid meal coverage if eating >50% of meals  Thanks, Tama Headings RN, MSN, BC-ADM Inpatient Diabetes Coordinator Team Pager [phone] (8a-5p)

## 2021-11-05 ENCOUNTER — Encounter: Payer: Self-pay | Admitting: Internal Medicine

## 2021-11-12 NOTE — Telephone Encounter (Signed)
M  thx Could you please let him know the echo 3/23 >> "mild hypertrophy" And 9/23 was very different [place] one of our echo gurus compare the two Thanks SK

## 2021-11-14 ENCOUNTER — Telehealth: Payer: Self-pay

## 2021-11-14 NOTE — Telephone Encounter (Signed)
Beth,RN w/ [name] returned call and given the verbal order.

## 2021-11-14 NOTE — Telephone Encounter (Signed)
Patient was advised he can take tylenol per [name] Ngetich,NP. Caren Macadam, RN w/ [name] to advise of verbal orders and no answer left message to return call.

## 2021-11-14 NOTE — Telephone Encounter (Signed)
-   okay to give [facility] verbal orders per [facility] policy. - May take Tylenol 500 mg tablet one by mouth every 8 hrs as needed for pain/fever/chills.

## 2021-11-14 NOTE — Telephone Encounter (Signed)
Beth,RN w/ [facility] called to request a verbal order for home health for one additional week. Also she reports that patient have a new area on toe that is black and he is reaching out to podiatry for it. Beth also want to know if patient could take Tylenol 500 or 650 mg allow on Tramadol 50mg . If he can take the tylenol how much can he take in a day and how often can he take it.

## 2021-11-21 ENCOUNTER — Telehealth: Payer: Self-pay | Admitting: *Deleted

## 2021-11-21 NOTE — Telephone Encounter (Signed)
Add medication as requested.Okay to give home Healthy verbal orders.

## 2021-11-21 NOTE — Telephone Encounter (Signed)
Beth with [facility] called requesting verbal orders for 1X4weeks and 1xevery other week for 4 weeks.  Also requesting to add Tums and Gas X to patient's current medication list due to patient taking them.   Please Advise.

## 2021-11-22 NOTE — Telephone Encounter (Signed)
Beth Notified.  Medication list updated.

## 2021-11-24 DIAGNOSIS — G4733 Obstructive sleep apnea (adult) (pediatric): Secondary | ICD-10-CM | POA: Diagnosis not present

## 2021-11-27 ENCOUNTER — Other Ambulatory Visit: Payer: Self-pay | Admitting: Family

## 2021-11-27 MED ORDER — DULOXETINE HCL 60 MG PO CPEP: 60.0000 mg | ORAL_CAPSULE | Freq: Every day | ORAL | 1 refills | Status: DC

## 2021-11-27 NOTE — Telephone Encounter (Signed)
Medication refilled

## 2021-11-27 NOTE — Telephone Encounter (Signed)
Patient requested refill on duloxetine and rosuvastatin.High warnings came up when trying to fill medications  Medications pended and sent to Marlowe Sax, NP

## 2021-11-28 ENCOUNTER — Telehealth: Payer: Self-pay | Admitting: *Deleted

## 2021-11-28 NOTE — Telephone Encounter (Signed)
Beth just saw Thaer, she wanted to make [name] aware he fell last Thurs., his foot got caught on his wheelchair. EMS came and picked him up off the floor. In doing so patient says it caused his neck to hurt. Tramadol is taking care of the pain. Patient has become more irritable, he is open to increasing the dose of the antidepressant he is currently on. Please contact Beth if you have any instructions for her.

## 2021-11-28 NOTE — Telephone Encounter (Signed)
Recommend follow up with Psychiatry service since has maxed out on Cymbalta.may call to schedule appointment with [facility] Telephone [phone]  who are able to do virtual visit or call any other Psychiatry service in the area.Notify provider if referral needed.

## 2021-11-29 NOTE — Telephone Encounter (Signed)
Lets wait for patient's response if he has any other preference.

## 2021-12-05 NOTE — Telephone Encounter (Signed)
Noted, to have him follow up as needed with [name] on this.

## 2021-12-05 NOTE — Telephone Encounter (Signed)
Patient says he not at the point where he needs a Psychiatrist. "I'm not there yet."

## 2021-12-09 ENCOUNTER — Other Ambulatory Visit: Payer: Self-pay | Admitting: Family

## 2021-12-09 DIAGNOSIS — F411 Generalized anxiety disorder: Secondary | ICD-10-CM

## 2021-12-11 NOTE — Telephone Encounter (Signed)
Pharmacy requested Refill Epic LR: 06/27/2021 Contract Date: 12/08/2020 added note to upcoming appointment to update.   Pended Rx and sent to [facility] for approval.

## 2021-12-11 NOTE — Telephone Encounter (Signed)
Wonderful.so glad to hear that. I have already send the refill.

## 2021-12-12 NOTE — Telephone Encounter (Signed)
Will have Carroll Kinds ,[place] fax lab orders to [facility] for Nurse to draw scheduled lab work on November 1 st,2023.

## 2021-12-13 ENCOUNTER — Telehealth: Payer: Self-pay | Admitting: Internal Medicine

## 2021-12-13 NOTE — Telephone Encounter (Signed)
Lab orders in place already just need to be printed and faxed.

## 2021-12-13 NOTE — Telephone Encounter (Signed)
Nurse is calling in to say patient weight is up 7lbs in the pass week. No increase in edema or SOB.

## 2021-12-14 NOTE — Telephone Encounter (Signed)
Spoke with Beth from [place] and advised will forward message to pt's general cardiologist, Dr Beckie Busing for further advisement regarding weight gain.  Encouraged Beth to also reach out to pt's nephrologist to discuss weight gain.  She verbalizes understanding and thanked Therapist, sports for the call.

## 2021-12-15 ENCOUNTER — Ambulatory Visit (INDEPENDENT_AMBULATORY_CARE_PROVIDER_SITE_OTHER): Payer: Medicare PPO

## 2021-12-15 DIAGNOSIS — I442 Atrioventricular block, complete: Secondary | ICD-10-CM

## 2021-12-15 LAB — CUP PACEART REMOTE DEVICE CHECK
Battery Remaining Longevity: 72 mo
Battery Remaining Percentage: 95.5 %
Battery Voltage: 2.99 V
Brady Statistic AP VP Percent: 81 %
Brady Statistic AP VS Percent: 1 %
Brady Statistic AS VP Percent: 19 %
Brady Statistic AS VS Percent: 1 %
Brady Statistic RA Percent Paced: 79 %
Brady Statistic RV Percent Paced: 99 %
Date Time Interrogation Session: 20231027041230
Implantable Lead Connection Status: 753985
Implantable Lead Connection Status: 753985
Implantable Lead Implant Date: 20230728
Implantable Lead Implant Date: 20230728
Implantable Lead Location: 753859
Implantable Lead Location: 753860
Implantable Lead Model: 3830
Implantable Pulse Generator Implant Date: 20230728
Lead Channel Impedance Value: 490 Ohm
Lead Channel Impedance Value: 540 Ohm
Lead Channel Pacing Threshold Amplitude: 0.625 V
Lead Channel Pacing Threshold Amplitude: 0.75 V
Lead Channel Pacing Threshold Pulse Width: 0.5 ms
Lead Channel Pacing Threshold Pulse Width: 0.5 ms
Lead Channel Sensing Intrinsic Amplitude: 11.5 mV
Lead Channel Sensing Intrinsic Amplitude: 3.8 mV
Lead Channel Setting Pacing Amplitude: 1.625
Lead Channel Setting Pacing Amplitude: 3.5 V
Lead Channel Setting Pacing Pulse Width: 0.5 ms
Lead Channel Setting Sensing Sensitivity: 2.5 mV
Pulse Gen Model: 2272
Pulse Gen Serial Number: 8101667

## 2021-12-15 NOTE — Telephone Encounter (Signed)
Spoke with Beth at [facility] who states that patient has gained around 7 pounds this week, and reports that he is around 4 pounds over baseline. Beth states she is nervous about doubling Lasix due to patients kidney function (last creatinine 2.33) Beth states that they have also reached out to patients kidney Dr. But have not heard back yet. Beth states that she would like to know what to do throughout the weekend.   Spoke with DOD (Dr. Percival Spanish) who recommends that patient take just an additional 40mg  of lasix for the next 3 days and repeat BMET next week.   Spoke with Eustaquio Maize and made her aware of Dr. Rosezella [place] recommendations, beth states that patient is scheduled for repeat BMET next week already with his PCP and will make sure they fax over the results to [place]. Beth aware of instructions and verbalized understanding.   Attempted to call patient, unable to reach. Left message for patient to call back.

## 2021-12-15 NOTE — Telephone Encounter (Signed)
Received call from patient, advised him of recommendations from Dr. Percival Spanish  (take additional 40mg  ) for 3 days and repeat BMET next week.   Patient repeated back to me he will take 120mg  in the morning and 80mg  as prescribed in the evening. Patient is scheduled for repeat BMET on Tuesday next week. Patient aware of all instructions and verbalized understanding.   Advised patient to call back to office with any issues, questions, or concerns. Patient verbalized understanding.

## 2021-12-18 DIAGNOSIS — E1122 Type 2 diabetes mellitus with diabetic chronic kidney disease: Secondary | ICD-10-CM | POA: Diagnosis not present

## 2021-12-18 DIAGNOSIS — I129 Hypertensive chronic kidney disease with stage 1 through stage 4 chronic kidney disease, or unspecified chronic kidney disease: Secondary | ICD-10-CM | POA: Diagnosis not present

## 2021-12-18 DIAGNOSIS — N183 Chronic kidney disease, stage 3 unspecified: Secondary | ICD-10-CM | POA: Diagnosis not present

## 2021-12-18 DIAGNOSIS — E1169 Type 2 diabetes mellitus with other specified complication: Secondary | ICD-10-CM | POA: Diagnosis not present

## 2021-12-18 DIAGNOSIS — E785 Hyperlipidemia, unspecified: Secondary | ICD-10-CM | POA: Diagnosis not present

## 2021-12-19 ENCOUNTER — Encounter: Payer: Self-pay | Admitting: Internal Medicine

## 2021-12-19 ENCOUNTER — Ambulatory Visit: Payer: Medicare PPO | Attending: Internal Medicine | Admitting: Internal Medicine

## 2021-12-19 VITALS — BP 112/60 | HR 73 | Ht 72.0 in | Wt 354.0 lb

## 2021-12-19 DIAGNOSIS — I442 Atrioventricular block, complete: Secondary | ICD-10-CM

## 2021-12-19 DIAGNOSIS — I5022 Chronic systolic (congestive) heart failure: Secondary | ICD-10-CM | POA: Diagnosis not present

## 2021-12-19 LAB — CUP PACEART INCLINIC DEVICE CHECK
Battery Remaining Longevity: 96 mo
Battery Voltage: 2.99 V
Brady Statistic RA Percent Paced: 79 %
Brady Statistic RV Percent Paced: 99.72 %
Date Time Interrogation Session: 20231031151615
Implantable Lead Connection Status: 753985
Implantable Lead Connection Status: 753985
Implantable Lead Implant Date: 20230728
Implantable Lead Implant Date: 20230728
Implantable Lead Location: 753859
Implantable Lead Location: 753860
Implantable Lead Model: 3830
Implantable Pulse Generator Implant Date: 20230728
Lead Channel Impedance Value: 525 Ohm
Lead Channel Impedance Value: 550 Ohm
Lead Channel Pacing Threshold Amplitude: 0.75 V
Lead Channel Pacing Threshold Amplitude: 0.75 V
Lead Channel Pacing Threshold Amplitude: 1 V
Lead Channel Pacing Threshold Amplitude: 1 V
Lead Channel Pacing Threshold Pulse Width: 0.5 ms
Lead Channel Pacing Threshold Pulse Width: 0.5 ms
Lead Channel Pacing Threshold Pulse Width: 0.5 ms
Lead Channel Pacing Threshold Pulse Width: 0.5 ms
Lead Channel Sensing Intrinsic Amplitude: 3.5 mV
Lead Channel Sensing Intrinsic Amplitude: 8 mV
Lead Channel Setting Pacing Amplitude: 1.75 V
Lead Channel Setting Pacing Amplitude: 2.5 V
Lead Channel Setting Pacing Pulse Width: 0.5 ms
Lead Channel Setting Sensing Sensitivity: 2.5 mV
Pulse Gen Model: 2272
Pulse Gen Serial Number: 8101667

## 2021-12-19 NOTE — Telephone Encounter (Signed)
Pt c/o swelling: STAT is pt has developed SOB within 24 hours  How much weight have you gained and in what time span?    If swelling, where is the swelling located?  Abdomen  Are you currently taking a fluid pill?   Are you currently SOB?  Eustaquio Maize is no longer with the patient, but states he was not SOB  Do you have a log of your daily weights (if so, list)?  10/31: 354  Have you gained 3 pounds in a day or 5 pounds in a week?   Have you traveled recently?   Beth of [facility] is following up to provide an update. She states she saw the patient this morning and his weight is still up to 354 lbs which is the same as when she saw him last week. Beth states the patient has been following medication regimen, but informed her that he missed his PM dose on 10/28. Beth reports patient is having more swelling around his abdomen, but no more swelling in his legs. Patient has not been SOB. Beth mentions that she drew labs today (CBC, CMP, TSH, lipid) and she is taking the orders to [facility] incase Dr. Caryl Comes would like to order other panels. Beth states a call back is not necessary unless Dr. Caryl Comes has questions.

## 2021-12-19 NOTE — Telephone Encounter (Signed)
Returned call to [place] at [place] who states that patient's weight is still around the same that it has been. Per Eustaquio Maize- patient has taken increased dose of lasix as ordered over the weekend, and reports that patient does have some increase swelling in the abdomen and top of thighs. Per [employer] patient does not seem volume overloaded on exam- reports lungs clear, no shortness of breath, and no lower extremity edema. Beth wanted to make MD aware and see if any new recommendations. Advised I would forward to make aware.   Patient is seeing Dr. Caryl Comes today at [facility]. Will also forward to Dr. Harl Bowie to make aware.

## 2021-12-19 NOTE — Progress Notes (Signed)
Patient Care Team: Ngetich, Nelda Bucks, NP as PCP - General (Family Medicine) Janina Mayo, MD as PCP - Cardiology (Cardiology) Deboraha Sprang, MD as PCP - Electrophysiology (Cardiology)     HPI Jesse [name] is a 71 y.o. male is seen in followup for pacemaker [employer] implanted 7/23 for symptomatic bradycardia and profound 1 AVB; Also O2 dependent COPD and anemia and moderate renal insufficiency   Following pacing he feels much better.  He and his family both noticed more energy.  He remains largely nonambulatory.  He is less dyspneic with transfer  He has treated sleep apnea for many many years.  He is now on oxygen because of nocturnal hypoxemia.    DATE TEST EF   3/23 Echo   60-65 % Technically difficult  RVE-mild-moderate MS        Date Cr K Hgb  6/23 2.1 3.5 11.7             Past Medical History:  Diagnosis Date   Alcohol use disorder in remission    Anxiety and depression    At high risk for falls    Former cigarette smoker    Generalized anxiety disorder    Hard of hearing    Hyperlipidemia due to type 2 diabetes mellitus ([place])    Hypertension    Morbid obesity ([place])    Neuropathy    O2 dependent    Risk for falls    Sleep apnea    Type 2 diabetes mellitus ([place])       Surgical History:  Past Surgical History:  Procedure Laterality Date   APPENDECTOMY  1980   IR THORACENTESIS ASP PLEURAL SPACE W/IMG GUIDE  05/18/2021   IR THORACENTESIS ASP PLEURAL SPACE W/IMG GUIDE  10/06/2021   KNEE SURGERY Right    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   Procedure: PACEMAKER IMPLANT;  Surgeon: Deboraha Sprang, MD;  Location: [facility];  Service: Cardiovascular;  Laterality: N/A;     Home Meds: Current Meds  Medication Sig   alfuzosin (UROXATRAL) 10 MG 24 hr tablet TAKE 1 TABLET BY MOUTH EVERY DAY (Patient taking differently: Take 10 mg by mouth daily with breakfast.)   ALPRAZolam (XANAX) 0.5 MG tablet Take 1 tablet (0.5 mg total) by mouth 2 (two)  times daily as needed for anxiety or sleep.   Blood Glucose Monitoring Suppl (ONE TOUCH ULTRA MINI) w/Device KIT Use to test blood sugar once daily. Dx:E11.22   calcium carbonate (TUMS - DOSED IN MG ELEMENTAL CALCIUM) 500 MG chewable tablet Chew 1 tablet by mouth daily.   Carboxymethylcellulose Sodium (DRY EYE RELIEF OP) Place 1 drop into both eyes daily as needed (dry eyes).   clopidogrel (PLAVIX) 75 MG tablet Take 1 tablet (75 mg total) by mouth daily.   cyanocobalamin 1000 MCG tablet Take 1,000 mcg by mouth daily.   DULoxetine (CYMBALTA) 60 MG capsule Take 1 capsule (60 mg total) by mouth daily.   empagliflozin (JARDIANCE) 25 MG TABS tablet TAKE 1 TABLET BY MOUTH EVERY DAY (Patient taking differently: Take 25 mg by mouth daily.)   ferrous sulfate (FEROSUL) 325 (65 FE) MG tablet TAKE 1 TABLET(325 MG) BY MOUTH DAILY WITH BREAKFAST (Patient taking differently: Take 325 mg by mouth daily with breakfast.)   furosemide (LASIX) 40 MG tablet Take 80 mg by mouth in the morning and at bedtime.   gabapentin (NEURONTIN) 100 MG capsule TAKE 1 CAPSULE(100 MG) BY MOUTH TWICE DAILY (Patient  taking differently: Take 100 mg by mouth 2 (two) times daily.)   glucose blood test strip Use to test blood sugar once daily. Dx:E11.22   insulin detemir (LEVEMIR FLEXPEN) 100 UNIT/ML FlexPen Inject 40 Units into the skin at bedtime.   Insulin Pen Needle 32G X 4 MM MISC Use to inject insulin twice daily. Dx: E11.22   isosorbide mononitrate (IMDUR) 60 MG 24 hr tablet Take 60 mg by mouth daily.   ketoconazole (NIZORAL) 2 % cream Apply 1 application topically as needed for irritation.   Lancet Device MISC 1 Device by Does not apply route daily.   Lancets MISC 1 Device by Does not apply route as directed. Use as directed.   Loperamide HCl (IMODIUM PO) Take 6 mg by mouth 3 (three) times daily with meals.   losartan (COZAAR) 25 MG tablet Take 25 mg by mouth daily.   NYSTATIN powder Apply 1 application  topically daily as needed  (Yeast infection to afffected area).   OVER THE COUNTER MEDICATION Apply 1 Application topically every other day. Head and shoulder medicated shampoo alternate with selsun blue medicated   OXYGEN Inhale 2 L into the lungs continuous.   potassium chloride SA (KLOR-CON M) 20 MEQ tablet Take 20 mEq by mouth 2 (two) times daily.   Red Yeast Rice 600 MG CAPS Take 1,200 mg by mouth daily.   rosuvastatin (CRESTOR) 10 MG tablet TAKE 1 TABLET BY MOUTH EVERY DAY   simethicone (MYLICON) 80 MG chewable tablet Chew 80 mg by mouth every 6 (six) hours as needed for flatulence.   topiramate (TOPAMAX) 25 MG tablet TAKE 1 TABLET BY MOUTH EVERY DAY (Patient taking differently: Take 25 mg by mouth daily.)   traMADol (ULTRAM) 50 MG tablet TAKE 1 TABLET(50 MG) BY MOUTH TWICE DAILY AS NEEDED (Patient taking differently: Take 50 mg by mouth every 12 (twelve) hours as needed for moderate pain.)   traZODone (DESYREL) 50 MG tablet Take 2 tablets (100 mg total) by mouth at bedtime.   triamcinolone cream (KENALOG) 0.1 % Apply 1 application. topically 2 (two) times daily. Apply to affected areas on shin area (Patient taking differently: Apply 1 application  topically 2 (two) times daily as needed (affected area). Apply to affected areas on shin area)   TURMERIC PO Take 1,000 mg by mouth daily.   VICTOZA 18 MG/3ML SOPN ADMINISTER 1.8 MG UNDER THE SKIN EVERY DAY (Patient taking differently: Inject 1.8 mg into the skin daily.)   Vitamin D, Ergocalciferol, (DRISDOL) 1.25 MG (50000 UNIT) CAPS capsule Take 50,000 Units by mouth every 7 (seven) days. Friday    Allergies:  Allergies  Allergen Reactions   Tape Rash           ROS:  Please see the history of present illness.     All other systems reviewed and negative.   BP 112/60   Pulse 73   Ht 6' (1.829 m)   Wt (!) 354 lb (160.6 kg)   SpO2 96%   BMI 48.01 kg/m   Well developed and Morbidly obese in no acute distressin a wheel chair HENT normal Neck supple with  JVP-flat Clear Device pocket well healed; without hematoma or erythema.  There is no tethering  Regular rate and rhythm, no  gallop No murmur Abd-soft with active BS No Clubbing cyanosis 2+ edema Skin-warm and dry A & Oriented  Grossly normal sensory and motor function  ECG SINUS AV pacing  Device function is normal.Programming changes reprogramming outputs  See Paceart  for details     Assessment and Plan:   Sinus node dysfunction  First-degree AV block and intermittent second and third-degree AV block junctional escape  Obstructive sleep apnea on CPAP  Renal insufficiency grade 3B  Mitral stenosis?  HFpEF-severe  Pulmonary hypertension  Oxygen dependent lung disease  Morbid obesity  Anemia not microcytic    Much improved following pacing       Virl Axe

## 2021-12-19 NOTE — Patient Instructions (Signed)
Medication Instructions:  Your physician recommends that you continue on your current medications as directed. Please refer to the Current Medication list given to you today.  *If you need a refill on your cardiac medications before your next appointment, please call your pharmacy*   Lab Work: None ordered.  If you have labs (blood work) drawn today and your tests are completely normal, you will receive your results only by: MyChart Message (if you have MyChart) OR A paper copy in the mail If you have any lab test that is abnormal or we need to change your treatment, we will call you to review the results.   Testing/Procedures: None ordered.    Follow-Up: At [facility], you and your health needs are our priority.  As part of our continuing mission to provide you with exceptional heart care, we have created designated Provider Care Teams.  These Care Teams include your primary Cardiologist (physician) and Advanced Practice Providers (APPs -  Physician Assistants and Nurse Practitioners) who all work together to provide you with the care you need, when you need it.  We recommend signing up for the patient portal called "MyChart".  Sign up information is provided on this After Visit Summary.  MyChart is used to connect with patients for Virtual Visits (Telemedicine).  Patients are able to view lab/test results, encounter notes, upcoming appointments, etc.  Non-urgent messages can be sent to your provider as well.   To learn more about what you can do with MyChart, go to [email or web address].    Your next appointment:   9 months with Dr Klein's PA  Important Information About Sugar       

## 2021-12-19 NOTE — Telephone Encounter (Signed)
Pt completed office visit with Dr Caryl Comes.  No changes made and recommends pt keep follow up with Dr Harl Bowie as scheduled.

## 2021-12-20 ENCOUNTER — Other Ambulatory Visit: Payer: Medicare PPO

## 2021-12-20 LAB — HEMOGLOBIN A1C: Hemoglobin A1C: 8

## 2021-12-20 LAB — LIPID PANEL
Cholesterol: 131 (ref 0–200)
HDL: 47 (ref 35–70)
LDL Cholesterol: 65
Triglycerides: 104 (ref 40–160)

## 2021-12-20 LAB — HEPATIC FUNCTION PANEL
ALT: 11 U/L (ref 10–40)
AST: 14 (ref 14–40)
Alkaline Phosphatase: 81 (ref 25–125)
Bilirubin, Total: 0.3

## 2021-12-20 LAB — COMPREHENSIVE METABOLIC PANEL
Albumin: 3.8 (ref 3.5–5.0)
Calcium: 9.5 (ref 8.7–10.7)
Globulin: 3.7
eGFR: 26

## 2021-12-20 LAB — BASIC METABOLIC PANEL
BUN: 60 — AB (ref 4–21)
CO2: 27 — AB (ref 13–22)
Chloride: 86 — AB (ref 99–108)
Creatinine: 2.5 — AB (ref 0.6–1.3)
Glucose: 99
Potassium: 3.5 mEq/L (ref 3.5–5.1)
Sodium: 139 (ref 137–147)

## 2021-12-20 LAB — CBC AND DIFFERENTIAL
HCT: 34 — AB (ref 41–53)
Hemoglobin: 10.9 — AB (ref 13.5–17.5)
Platelets: 282 10*3/uL (ref 150–400)
WBC: 7.6

## 2021-12-20 LAB — TSH: TSH: 1.58 (ref 0.41–5.90)

## 2021-12-20 LAB — CBC: RBC: 3.79 — AB (ref 3.87–5.11)

## 2021-12-20 NOTE — Telephone Encounter (Signed)
Increase Levemir from 40 units SQ to 42 units daily.Please bring your blood sugar log to visit on Friday for evaluation.

## 2021-12-21 NOTE — Progress Notes (Signed)
Remote pacemaker transmission.   

## 2021-12-22 ENCOUNTER — Encounter: Payer: Self-pay | Admitting: Family

## 2021-12-22 ENCOUNTER — Ambulatory Visit (INDEPENDENT_AMBULATORY_CARE_PROVIDER_SITE_OTHER): Payer: Medicare PPO | Admitting: Family

## 2021-12-22 VITALS — BP 138/74 | HR 79 | Temp 97.7°F | Resp 20 | Ht 72.0 in | Wt 350.0 lb

## 2021-12-22 DIAGNOSIS — F5101 Primary insomnia: Secondary | ICD-10-CM | POA: Diagnosis not present

## 2021-12-22 DIAGNOSIS — N1831 Chronic kidney disease, stage 3a: Secondary | ICD-10-CM

## 2021-12-22 DIAGNOSIS — G4733 Obstructive sleep apnea (adult) (pediatric): Secondary | ICD-10-CM | POA: Diagnosis not present

## 2021-12-22 DIAGNOSIS — E785 Hyperlipidemia, unspecified: Secondary | ICD-10-CM

## 2021-12-22 DIAGNOSIS — N183 Chronic kidney disease, stage 3 unspecified: Secondary | ICD-10-CM

## 2021-12-22 DIAGNOSIS — F411 Generalized anxiety disorder: Secondary | ICD-10-CM | POA: Diagnosis not present

## 2021-12-22 DIAGNOSIS — E1122 Type 2 diabetes mellitus with diabetic chronic kidney disease: Secondary | ICD-10-CM | POA: Diagnosis not present

## 2021-12-22 DIAGNOSIS — G63 Polyneuropathy in diseases classified elsewhere: Secondary | ICD-10-CM

## 2021-12-22 DIAGNOSIS — I129 Hypertensive chronic kidney disease with stage 1 through stage 4 chronic kidney disease, or unspecified chronic kidney disease: Secondary | ICD-10-CM | POA: Diagnosis not present

## 2021-12-22 DIAGNOSIS — E1169 Type 2 diabetes mellitus with other specified complication: Secondary | ICD-10-CM | POA: Diagnosis not present

## 2021-12-22 MED ORDER — VITAMIN D3 50 MCG (2000 UT) PO CAPS: 1000.0000 [IU] | ORAL_CAPSULE | Freq: Every day | ORAL | 3 refills | Status: DC

## 2021-12-22 MED ORDER — ALPRAZOLAM 0.25 MG PO TABS: 0.5000 mg | ORAL_TABLET | Freq: Two times a day (BID) | ORAL | 0 refills | Status: DC | PRN

## 2021-12-22 NOTE — Progress Notes (Signed)
Provider: Marlowe Sax FNP-C   Adeli Frost, Nelda Bucks, NP  Patient Care Team: Kirra Verga, Nelda Bucks, NP as PCP - General (Family Medicine) Janina Mayo, MD as PCP - Cardiology (Cardiology) Deboraha Sprang, MD as PCP - Electrophysiology (Cardiology)  Extended Emergency Contact Information Primary Emergency Contact: Reuben Likes Address: [address]          [place],  [address] [place] of [place] Phone: [phone] Relation: Friend Secondary Emergency Contact: [place] Mobile Phone: [phone] Relation: Daughter Preferred language: English Interpreter needed? No  Code Status:  Full Code  Goals of care: Advanced Directive information    12/22/2021    9:34 AM  Advanced Directives  Does Patient Have a Medical Advance Directive? Yes  Type of Advance Directive Living will  Does patient want to make changes to medical advance directive? No - Patient declined     Chief Complaint  Patient presents with   Medical Management of Chronic Issues    Patient is here for a follow up for chronic conditions    Quality Metric Gaps    Patient is due for AWV,foot exam,Urine ACR Discuss need for updated vaccines tdap,pcv,covid, and second shingrix    HPI:  Pt is a 71 y.o. male seen today for 6 months follow up for medical management of chronic diseases.He is here with friend who states patient has been more sleepy during the day.she called him few times this month and he did not answer so she requested staff at the facility to check on him and was found sleeping on the power wheelchair.staff had to waking him hard.Friend would like medication evaluated.states Gabapentin then to make him sleepy. Discussed with patient use of Gabapentin thinks not working.Also has been taking his Alprazolam along with medication instead of as needed.  His recent lab work reviewed and discussed during visit. Hgb 10.9 improved from previous 10.6.He denies any signs of bleeding or  fatigue.  Hgb A1C 8.0 would like referral to another Endocrinologist.blood sugars have been running high in the 200's.He was advised to increase his insulin yesterday but has not adjusted yet.   BUN 60,CR 2.5 slightly worsen compared to previous CR 2.33,BUN 43 continues to follow up with Nephrologist.   States not sleeping well at night despite use of Trazodone.He does use his CPAP during the night and day when he takes a nap.  Headaches on top of the head.He tried to wean off Topamax in the past but headache comes back.No migraine.  He complains of right shoulder pain.No injuries to shoulder.He denies any weakness,Numbness or tingling of fingers.  Slid off the bed few months ago.He was assisted off the floor by Paramedic.Had no injuries.  He is due for Tdap, Shingrix, COVID-19 vaccine.  Advised to get vaccine at his pharmacy   Past Medical History:  Diagnosis Date   Alcohol use disorder in remission    Anxiety and depression    At high risk for falls    Former cigarette smoker    Generalized anxiety disorder    Hard of hearing    Hyperlipidemia due to type 2 diabetes mellitus ([place])    Hypertension    Morbid obesity ([place])    Neuropathy    O2 dependent    Risk for falls    Sleep apnea    Type 2 diabetes mellitus ([place])    Past Surgical History:  Procedure Laterality Date   APPENDECTOMY  1980   IR THORACENTESIS ASP PLEURAL SPACE W/IMG GUIDE  05/18/2021  IR THORACENTESIS ASP PLEURAL SPACE W/IMG GUIDE  10/06/2021   KNEE SURGERY Right    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 If you have any questions, ask your nurse or doctor.          alfuzosin 10 MG 24 hr  tablet Commonly known as: UROXATRAL TAKE 1 TABLET BY MOUTH EVERY DAY What changed: when to take this   ALPRAZolam 0.5 MG tablet Commonly known as: XANAX Take 1 tablet (0.5 mg total) by mouth 2 (two) times daily as needed for anxiety or sleep.   calcium carbonate 500 MG chewable tablet Commonly known as: TUMS - dosed in mg elemental calcium Chew 1 tablet by mouth daily.   clopidogrel 75 MG tablet Commonly known as: PLAVIX Take 1 tablet (75 mg total) by mouth daily.   cyanocobalamin 1000 MCG tablet Take 1,000 mcg by mouth daily.   DRY EYE RELIEF OP Place 1 drop into both eyes daily as needed (dry eyes).   DULoxetine 60 MG capsule Commonly known as: CYMBALTA Take 1 capsule (60 mg total) by mouth daily.   FeroSul 325 (65 FE) MG tablet Generic drug: ferrous sulfate TAKE 1 TABLET(325 MG) BY MOUTH DAILY WITH BREAKFAST What changed: See the new instructions.   furosemide 40 MG tablet Commonly known as: LASIX Take 80 mg by mouth in the morning and at bedtime.   gabapentin 100 MG capsule Commonly known as: NEURONTIN TAKE 1 CAPSULE(100 MG) BY MOUTH TWICE DAILY What changed: See the new instructions.   glucose blood test strip Use to test blood sugar once daily. Dx:E11.22   IMODIUM PO Take 6 mg by mouth 3 (three) times daily with meals.   Insulin Pen Needle 32G X 4 MM Misc Use to inject insulin twice daily. Dx: E11.22   isosorbide mononitrate 60 MG 24 hr tablet Commonly known as: IMDUR Take 60 mg by mouth daily.   Jardiance 25 MG Tabs tablet Generic drug: empagliflozin TAKE 1 TABLET BY MOUTH EVERY DAY What changed: how much to take   ketoconazole 2 % cream Commonly known as: NIZORAL Apply 1 application topically as needed for irritation.   Lancet Device Misc 1 Device by Does not apply route daily.   Lancets Misc 1 Device by Does not apply route as directed. Use as directed.   Levemir FlexPen 100 UNIT/ML FlexPen Generic drug: insulin detemir Inject 40 Units  into the skin at bedtime.   losartan 25 MG tablet Commonly known as: COZAAR Take 25 mg by mouth daily.   nystatin powder Generic drug: nystatin Apply 1 application  topically daily as needed (Yeast infection to afffected area).   ONE TOUCH ULTRA MINI w/Device Kit Use to test blood sugar once daily. Dx:E11.22   OVER THE COUNTER MEDICATION Apply 1 Application topically every other day. Head and shoulder medicated shampoo alternate with selsun blue medicated   OXYGEN Inhale 2 L into the lungs continuous.   potassium chloride SA 20 MEQ tablet Commonly known as: KLOR-CON M Take 20 mEq by mouth 2 (two) times daily.   Red Yeast Rice 600  MG Caps Take 1,200 mg by mouth daily.   rosuvastatin 10 MG tablet Commonly known as: CRESTOR TAKE 1 TABLET BY MOUTH EVERY DAY   simethicone 80 MG chewable tablet Commonly known as: MYLICON Chew 80 mg by mouth every 6 (six) hours as needed for flatulence.   topiramate 25 MG tablet Commonly known as: TOPAMAX TAKE 1 TABLET BY MOUTH EVERY DAY   traMADol 50 MG tablet Commonly known as: ULTRAM TAKE 1 TABLET(50 MG) BY MOUTH TWICE DAILY AS NEEDED What changed: See the new instructions.   traZODone 50 MG tablet Commonly known as: DESYREL Take 2 tablets (100 mg total) by mouth at bedtime.   triamcinolone cream 0.1 % Commonly known as: KENALOG Apply 1 application. topically 2 (two) times daily. Apply to affected areas on shin area What changed:  when to take this reasons to take this   TURMERIC PO Take 1,000 mg by mouth daily.   Victoza 18 MG/3ML Sopn Generic drug: liraglutide ADMINISTER 1.8 MG UNDER THE SKIN EVERY DAY What changed: See the new instructions.   Vitamin D (Ergocalciferol) 1.25 MG (50000 UNIT) Caps capsule Commonly known as: DRISDOL Take 50,000 Units by mouth every 7 (seven) days. Friday        Review of Systems  Constitutional:  Negative for appetite change, chills, fatigue, fever and unexpected weight change.   HENT:  Negative for congestion, dental problem, ear discharge, ear pain, facial swelling, hearing loss, nosebleeds, postnasal drip, rhinorrhea, sinus pressure, sinus pain, sneezing, sore throat, tinnitus and trouble swallowing.   Eyes:  Negative for pain, discharge, redness, itching and visual disturbance.  Respiratory:  Negative for cough, chest tightness, shortness of breath and wheezing.   Cardiovascular:  Negative for chest pain, palpitations and leg swelling.  Gastrointestinal:  Negative for abdominal distention, abdominal pain, blood in stool, constipation, diarrhea, nausea and vomiting.       Colostomy   Endocrine: Negative for cold intolerance, heat intolerance, polydipsia, polyphagia and polyuria.  Genitourinary:  Negative for difficulty urinating, dysuria, flank pain, frequency and urgency.  Musculoskeletal:  Positive for arthralgias and gait problem. Negative for back pain, joint swelling, myalgias, neck pain and neck stiffness.  Skin:  Negative for color change, pallor, rash and wound.  Neurological:  Negative for dizziness, syncope, speech difficulty, weakness, light-headedness, numbness and headaches.       Chronic headaches   Hematological:  Does not bruise/bleed easily.  Psychiatric/Behavioral:  Positive for sleep disturbance. Negative for agitation, behavioral problems, confusion, hallucinations, self-injury and suicidal ideas. The patient is not nervous/anxious.     Immunization History  Administered Date(s) Administered   Fluad Quad(high Dose 65+) 11/19/2020, 11/02/2021   H1N1 01/24/2008   Moderna Sars-Covid-2 Vaccination 04/03/2019, 05/05/2019, 12/20/2019, 05/23/2020   Zoster Recombinat (Shingrix) 03/01/2020   Pertinent  Health Maintenance Due  Topic Date Due   FOOT EXAM  Never done   OPHTHALMOLOGY EXAM  Never done   HEMOGLOBIN A1C  06/20/2022   INFLUENZA VACCINE  Completed   COLONOSCOPY (Pts 45-45yr Insurance coverage will need to be confirmed)  Discontinued       10/31/2021    7:00 PM 11/01/2021    1:00 PM 11/02/2021    1:00 AM 11/02/2021    8:00 AM 12/22/2021    9:34 AM  [place]     [place]  Was there an injury with Fall?     0  Fall Risk Category Calculator     0  Fall Risk Category  Low  Patient Fall Risk Level Moderate fall risk High fall risk High fall risk High fall risk Low fall risk  Patient at Risk for Falls Due to     No Fall Risks  Fall risk Follow up     Falls evaluation completed   Functional Status Survey:    Vitals:   12/22/21 1256  BP: 138/74  Pulse: 79  Resp: 20  Temp: 97.7 F (36.5 C)  SpO2: (!) 65%  Weight: (!) 350 lb (158.8 kg)  Height: 6' (1.829 m)   Body mass index is 47.47 kg/m. Physical Exam Vitals reviewed.  Constitutional:      General: He is not in acute distress.    Appearance: Normal appearance. He is morbidly obese. He is not ill-appearing or diaphoretic.  HENT:     Head: Normocephalic.     Right Ear: Tympanic membrane, ear canal and external ear normal. There is no impacted cerumen.     Left Ear: Tympanic membrane, ear canal and external ear normal. There is no impacted cerumen.     Nose: Nose normal. No congestion or rhinorrhea.     Mouth/Throat:     Mouth: Mucous membranes are moist.     Pharynx: Oropharynx is clear. No oropharyngeal exudate or posterior oropharyngeal erythema.  Eyes:     General: No scleral icterus.       Right eye: No discharge.        Left eye: No discharge.     Extraocular Movements: Extraocular movements intact.     Conjunctiva/sclera: Conjunctivae normal.     Pupils: Pupils are equal, round, and reactive to light.  Neck:     Vascular: No carotid bruit.  Cardiovascular:     Rate and Rhythm: Normal rate and regular rhythm.     Pulses: Normal pulses.     Heart sounds: Normal heart sounds. No murmur heard.    No friction rub. No gallop.  Pulmonary:     Effort: Pulmonary effort is normal. No respiratory distress.     Breath sounds: Normal  breath sounds. No wheezing, rhonchi or rales.  Chest:     Chest wall: No tenderness.  Abdominal:     General: Bowel sounds are normal. There is no distension.     Palpations: Abdomen is soft. There is no mass.     Tenderness: There is no abdominal tenderness. There is no right CVA tenderness, left CVA tenderness, guarding or rebound.     Comments: Right colostomy bag patent   Musculoskeletal:        General: No swelling or tenderness. Normal range of motion.     Cervical back: Normal range of motion. No rigidity or tenderness.     Right lower leg: No edema.     Left lower leg: No edema.     Comments: On power wheelchair   Lymphadenopathy:     Cervical: No cervical adenopathy.  Skin:    General: Skin is warm and dry.     Coloration: Skin is not pale.     Findings: No bruising, erythema, lesion or rash.  Neurological:     Mental Status: He is alert and oriented to person, place, and time.     Cranial Nerves: No cranial nerve deficit.     Motor: No weakness.     Coordination: Coordination normal.     Gait: Gait abnormal.  Psychiatric:        Mood and Affect: Mood normal.        Speech:  Speech normal.        Behavior: Behavior normal.        Thought Content: Thought content normal.        Judgment: Judgment normal.     Labs reviewed: Recent Labs    05/17/21 0356 05/18/21 0552 09/08/21 0910 09/11/21 0000 09/15/21 0753 10/31/21 1920 12/20/21 0000  NA 138   < > 135   < > 137 135 139  K 4.2   < > 3.1*   < > 4.2 3.6 3.5  CL 104   < > 90*   < > 103 93* 86*  CO2 27   < > 29   < > 24 30 27*  GLUCOSE 107*   < > 190*  --  135* 295*  --   BUN 29*   < > 71*   < > 48* 43* 60*  CREATININE 1.96*   < > 2.49*   < > 2.25* 2.33* 2.5*  CALCIUM 8.2*   < > 8.8   < > 8.5* 9.0 9.5  MG 2.2  --   --   --   --   --   --    < > = values in this interval not displayed.   Recent Labs    03/10/21 1002 03/10/21 1002 05/12/21 2050 06/13/21 0000 06/27/21 0000 09/11/21 0000 10/31/21 1920  12/20/21 0000  AST 14  --  13* 15  --   --  13* 14  ALT 9  --  7 10  --   --  11 11  ALKPHOS  --    < > 49 75  --   --  70 81  BILITOT 0.5  --  0.5  --   --   --  0.4  --   PROT 6.7  --  7.0  --   --   --  7.2  --   ALBUMIN  --   --  3.8  --    < > 3.6 3.8 3.8   < > = values in this interval not displayed.   Recent Labs    03/10/21 1002 05/12/21 1830 05/23/21 1540 06/27/21 0000 09/08/21 0910 10/31/21 1920 12/20/21 0000  WBC 3.4*   < > 4.8   < > 6.2 7.9 7.6  NEUTROABS 1,941  --  3,134  --   --  5.6  --   HGB 10.3*   < > 10.1*   < > 10.7* 10.6* 10.9*  HCT 33.7*   < > 32.1*   < > 33.4* 32.6* 34*  MCV 97.4   < > 97.3  --  93 94.2  --   PLT 130*   < > 194   < > 221 239 282   < > = values in this interval not displayed.   Lab Results  Component Value Date   TSH 1.58 12/20/2021   Lab Results  Component Value Date   HGBA1C 8.0 12/20/2021   Lab Results  Component Value Date   CHOL 131 12/20/2021   HDL 47 12/20/2021   LDLCALC 65 12/20/2021   TRIG 104 12/20/2021   CHOLHDL 3.2 11/02/2021    Significant Diagnostic Results in last 30 days:  CUP PACEART INCLINIC DEVICE CHECK  Result Date: 12/19/2021 Pacemaker check in clinic. Normal device function. Thresholds, sensing, impedances consistent with previous measurements. Device programmed to maximize longevity. AMS episode 8 seconds. Device programmed at appropriate safety margins. Histogram distribution appropriate for patient activity level. Device programmed to  optimize intrinsic conduction. Estimated longevity 8-8.5 years. Patient enrolled in remote follow-up. Patient education completed.Myrtie Hawk, BSN, RN  CUP PACEART REMOTE DEVICE CHECK  Result Date: 12/15/2021 Scheduled remote reviewed. Normal device function.  1 AMS episode, < 1 minute Next remote 91 days.   Assessment/Plan  1. Type 2 diabetes mellitus with stage 3 chronic kidney disease, without long-term current use of insulin, unspecified whether stage 3a or 3b  CKD ([place]) Lab Results  Component Value Date   HGBA1C 8.0 12/20/2021  Advised to increase Levemir from 40 units subcu to 42 units daily Continue to check blood sugars and notify provider if consistently above 200. - Ambulatory referral to Endocrinology -Continue to follow-up with the podiatrist as scheduled in the facility. -Annual eye exam up-to-date  2. Hyperlipidemia due to type 2 diabetes mellitus (HCC) LDL at goal -Continue on rosuvastatin 10 mg daily  3. Benign hypertension with CKD (chronic kidney disease) stage III ([facility]) Blood pressure at goal Continue on losartan, furosemide and Imdur Continue to monitor blood pressure at home Provide greater than 140/90.  4. Generalized anxiety disorder Friend reports patient has been more sleepy during the day suspect due to polypharmacy.  Discussed with patient to do some alprazolam from 1 mg twice daily to 0.25 mg twice daily as needed - ALPRAZolam (XANAX) 0.25 MG tablet; Take 2 tablets (0.5 mg total) by mouth 2 (two) times daily as needed for anxiety or sleep.  Dispense: 60 tablet; Refill: 0  5. Obstructive sleep apnea syndrome Continue on his CPAP  6. Polyneuropathy associated with underlying disease ([place]) Gabapentin ineffective -Discontinue gabapentin -We will continue to manage high risk factors  7. Chronic kidney disease, stage 3a (HCC) Recent creatinine slightly worsened CR 2.5 compared to previous 2.33 Continue to avoid nephrotoxins dose all medication for renal clearance Continue to follow-up with nephrologist  8. Primary insomnia Melatonin and trazodone ineffective Sleep hygiene discussed at length We will have him follow-up with neurologist to evaluate insomnia - Ambulatory referral to Neurology  Family/ staff Communication: Reviewed plan of care with patient and friend verbalized understanding  Labs/tests ordered: None   Next Appointment : Return in about 6 months (around 06/22/2022) for medical mangement of chronic  issues., Fasting labs in 6 months prior to visit.   [name], NP

## 2021-12-25 DIAGNOSIS — G4733 Obstructive sleep apnea (adult) (pediatric): Secondary | ICD-10-CM | POA: Diagnosis not present

## 2021-12-27 ENCOUNTER — Ambulatory Visit: Payer: Medicare PPO | Admitting: Internal Medicine

## 2021-12-28 ENCOUNTER — Ambulatory Visit: Payer: Medicare PPO | Attending: Internal Medicine | Admitting: Internal Medicine

## 2021-12-28 ENCOUNTER — Encounter: Payer: Self-pay | Admitting: Internal Medicine

## 2021-12-28 VITALS — BP 118/64 | HR 75 | Ht 72.0 in | Wt 354.0 lb

## 2021-12-28 DIAGNOSIS — N1832 Chronic kidney disease, stage 3b: Secondary | ICD-10-CM | POA: Diagnosis not present

## 2021-12-28 DIAGNOSIS — I5031 Acute diastolic (congestive) heart failure: Secondary | ICD-10-CM | POA: Diagnosis not present

## 2021-12-28 DIAGNOSIS — I129 Hypertensive chronic kidney disease with stage 1 through stage 4 chronic kidney disease, or unspecified chronic kidney disease: Secondary | ICD-10-CM | POA: Diagnosis not present

## 2021-12-28 DIAGNOSIS — K219 Gastro-esophageal reflux disease without esophagitis: Secondary | ICD-10-CM

## 2021-12-28 DIAGNOSIS — R609 Edema, unspecified: Secondary | ICD-10-CM | POA: Diagnosis not present

## 2021-12-28 MED ORDER — PANTOPRAZOLE SODIUM 40 MG PO TBEC: 40.0000 mg | DELAYED_RELEASE_TABLET | Freq: Every day | ORAL | 3 refills | Status: DC

## 2021-12-28 NOTE — Patient Instructions (Signed)
Medication Instructions:  No Changes In Medications at this time.  *If you need a refill on your cardiac medications before your next appointment, please call your pharmacy*  Lab Work: None Ordered At This Time.  If you have labs (blood work) drawn today and your tests are completely normal, you will receive your results only by: MyChart Message (if you have MyChart) OR A paper copy in the mail If you have any lab test that is abnormal or we need to change your treatment, we will call you to review the results.  Testing/Procedures: None Ordered At This Time.   Follow-Up: At [facility], you and your health needs are our priority.  As part of our continuing mission to provide you with exceptional heart care, we have created designated Provider Care Teams.  These Care Teams include your primary Cardiologist (physician) and Advanced Practice Providers (APPs -  Physician Assistants and Nurse Practitioners) who all work together to provide you with the care you need, when you need it.  Your next appointment:   6 month(s)  The format for your next appointment:   In Person  Provider:   Branch, Mary E, MD           

## 2021-12-28 NOTE — Progress Notes (Signed)
Cardiology Office Note:    Date:  12/28/2021   ID:  Jesse [name], DOB 1951-01-08, MRN [id number]  PCP:  [name], NP   [facility] Providers Cardiologist:  Janina Mayo, MD Electrophysiologist:  Virl Axe, MD     Referring MD: [name], NP   No chief complaint on file. HFpEF  History of Present Illness:    Jesse [name] is a 71 y.o. male with a hx of CKD3b, obesity,  bed/wheelchair bound,  hypertension, type 2 diabetes mellitus 5.9 %, sp colectomy/ colostomy, iron deficiency anemia on oral iron, OSA on CPAP, HFpEF  Per her EMR report. He presented with acute on chronic respiratory failure and noted LE edema. His BNP was 218. Hgb 9.1. He was diuresed. He had a large pleural effusion s/p thora yielding 1.2 L.  Fluid is hazy, cloudy and yellow. Cytology was negative. Had a repeat 2L drained. Non infectious.He was diuresed with IV Lasix he is  17 L negative, weight down 38 pounds. His dry weight is ~ 363 pounds. He takes lasix 40 mg BID.  Wt Readings from Last 3 Encounters:  12/28/21 (!) 354 lb (160.6 kg)  12/22/21 (!) 350 lb (158.8 kg)  12/19/21 (!) 354 lb (160.6 kg)   TC- 94 HDL 39 LDL 38 TSH normal  Crt 2.1 on 05/23/2021 from 1.8-2 , K 4.6  He notes PT looked at his legs prior to the hospital and she instructed him to seek care. This was the first time he was admitted for for decompensated HF. He's been in the hospital. 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  He has a large hernia.He has hx of  CKD3a/b. He was being seen in [place] for fluid retention related to kidney disease. He's been on metolazone and bumex in the past. He self adjusted his diuretics. He does not have scale. He sleeps in a recliner and hoping to trial his bed that's at an incline.  His L>R of his legs He had no DVT. Noted did not want DNR.  He has a living will. His son and wife are designated medical power of attorney.  His BP meds imdur and valsartan were  stopped with soft Bps.   Interim 12/28/2021 He was admitted in September with R facial droop with c/f possible TIA? Did not undergo MRI 2/2 concern for two different lead companies. Echo showed normal fxn. CT head was negative. Neurology evaluated him and recommended DAPT for 3 weeks and then plavix  Noted to have some weight gain; told him to double his lasix, returns for a visit. Blood pressures are well controlled. Saw Dr. Caryl Comes 10/31 s/p PPM. No changes. He is on 160 mg lasix notes he is losing weight. He is 345 pounds; notes was 407 in March.    Wt Readings from Last 3 Encounters:  12/28/21 (!) 354 lb (160.6 kg)  12/22/21 (!) 350 lb (158.8 kg)  12/19/21 (!) 354 lb (160.6 kg)     Cardiology Studies: EKG 05/16/2021: junctional rhythm RBBB morphology HEN:IDPO quality very challenging to fully assess WMA. EF 60-65%, RV moderately enlarged. Svi 40 cc/m2. Dilate LA. Mean MV gradiient 5 mmHg rate 64 bpm noted has mild-mod MS. Mild AS. IVC dialted, with RA pressure of 15 mmHg. Diastolgy indeterminate. 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  CHB with junctional escape rhythm.   PPM DC Medtronic 09/15/2021  Past Medical History:  Diagnosis Date  Alcohol use disorder in remission    Anxiety and depression    At high risk for falls    Former cigarette smoker    Generalized anxiety disorder    Hard of hearing    Hyperlipidemia due to type 2 diabetes mellitus ([place])    Hypertension    Morbid obesity ([place])    Neuropathy    O2 dependent    Risk for falls    Sleep apnea    Type 2 diabetes mellitus ([place])     Past Surgical History:  Procedure Laterality Date   APPENDECTOMY  1980   IR THORACENTESIS ASP PLEURAL SPACE W/IMG GUIDE  05/18/2021   IR THORACENTESIS ASP PLEURAL SPACE W/IMG GUIDE  10/06/2021   KNEE SURGERY Right    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   Procedure: PACEMAKER IMPLANT;  Surgeon: Deboraha Sprang, MD;  Location: [facility];  Service: Cardiovascular;  Laterality: N/A;    Current Medications: Current Meds  Medication Sig   alfuzosin (UROXATRAL) 10 MG 24 hr tablet TAKE 1 TABLET BY MOUTH EVERY DAY (Patient taking differently: Take 10 mg by mouth daily with breakfast.)   ALPRAZolam (XANAX) 0.25 MG tablet Take 2 tablets (0.5 mg total) by mouth 2 (two) times daily as needed for anxiety or sleep.   Blood Glucose Monitoring Suppl (ONE TOUCH ULTRA MINI) w/Device KIT Use to test blood sugar once daily. Dx:E11.22   calcium carbonate (TUMS - DOSED IN MG ELEMENTAL CALCIUM) 500 MG chewable tablet Chew 1 tablet by mouth daily.   Carboxymethylcellulose Sodium (DRY EYE RELIEF OP) Place 1 drop into both eyes daily as needed (dry eyes).   Cholecalciferol (VITAMIN D3) 50 MCG (2000 UT) capsule Take 0.5 capsules (1,000 Units total) by mouth daily.   clopidogrel (PLAVIX) 75 MG tablet Take 1 tablet (75 mg total) by mouth daily.   cyanocobalamin 1000 MCG tablet Take 1,000 mcg by mouth daily.   DULoxetine (CYMBALTA) 60 MG capsule Take 1 capsule (60 mg total) by mouth daily.   empagliflozin (JARDIANCE) 25 MG TABS tablet TAKE 1 TABLET BY MOUTH EVERY DAY (Patient taking differently: Take 25 mg by mouth daily.)   ferrous sulfate (FEROSUL) 325 (65 FE) MG tablet TAKE 1 TABLET(325 MG) BY MOUTH DAILY WITH BREAKFAST (Patient taking differently: Take 325 mg by mouth daily with breakfast.)   furosemide (LASIX) 40 MG tablet Take 80 mg by mouth in the morning and at bedtime.   glucose blood test strip Use to test blood sugar once daily. Dx:E11.22   insulin detemir (LEVEMIR FLEXPEN) 100 UNIT/ML FlexPen Inject 42 Units into the skin at bedtime.   Insulin Pen Needle 32G X 4 MM MISC Use to inject insulin twice daily. Dx: E11.22   isosorbide mononitrate (IMDUR) 60 MG 24 hr tablet Take 60 mg by mouth daily.   ketoconazole (NIZORAL) 2 % cream Apply 1 application topically as needed for irritation.   Lancet Device MISC 1 Device by Does not  apply route daily.   Lancets MISC 1 Device by Does not apply route as directed. Use as directed.   Loperamide HCl (IMODIUM PO) Take 6 mg by mouth 3 (three) times daily with meals.   losartan (COZAAR) 25 MG tablet Take 25 mg by mouth daily.   NYSTATIN powder Apply 1 application  topically daily as needed (Yeast infection to afffected area).   OVER THE COUNTER MEDICATION Apply 1 Application topically every other day. Head and shoulder medicated shampoo alternate with selsun blue medicated   OXYGEN  Inhale 2 L into the lungs continuous.   potassium chloride SA (KLOR-CON M) 20 MEQ tablet Take 20 mEq by mouth 2 (two) times daily.   Red Yeast Rice 600 MG CAPS Take 1,200 mg by mouth daily.   rosuvastatin (CRESTOR) 10 MG tablet TAKE 1 TABLET BY MOUTH EVERY DAY   simethicone (MYLICON) 80 MG chewable tablet Chew 80 mg by mouth every 6 (six) hours as needed for flatulence.   topiramate (TOPAMAX) 25 MG tablet TAKE 1 TABLET BY MOUTH EVERY DAY (Patient taking differently: Take 25 mg by mouth daily.)   traMADol (ULTRAM) 50 MG tablet TAKE 1 TABLET(50 MG) BY MOUTH TWICE DAILY AS NEEDED (Patient taking differently: Take 50 mg by mouth every 12 (twelve) hours as needed for moderate pain.)   traZODone (DESYREL) 50 MG tablet Take 2 tablets (100 mg total) by mouth at bedtime.   triamcinolone cream (KENALOG) 0.1 % Apply 1 application. topically 2 (two) times daily. Apply to affected areas on shin area (Patient taking differently: Apply 1 application  topically 2 (two) times daily as needed (affected area). Apply to affected areas on shin area)   TURMERIC PO Take 1,000 mg by mouth daily.   VICTOZA 18 MG/3ML SOPN ADMINISTER 1.8 MG UNDER THE SKIN EVERY DAY (Patient taking differently: Inject 1.8 mg into the skin daily.)     Allergies:   Tape   Social History   Socioeconomic History   Marital status: Divorced    Spouse name: Not on file   Number of children: 3   Years of education: Not on file   Highest education  level: Master's degree (e.g., MA, MS, MEng, MEd, MSW, MBA)  Occupational History   Occupation: Retired  Tobacco Use   Smoking status: Former    Packs/day: 1.00    Types: Cigarettes    Quit date: 1999    Years since quitting: 24.8   Smokeless tobacco: Never  Vaping Use   Vaping Use: Never used  Substance and Sexual Activity   Alcohol use: Not Currently   Drug use: Never   Sexual activity: Not on file  Other Topics Concern   Not on file  Social History Narrative   Tobacco use, amount per day now: 0   Past tobacco use, amount per day: 1 pack   How many years did you use tobacco: 10 last 1999   Alcohol use (drinks per week): 1/5 day last 1999   Diet:   Do you drink/eat things with caffeine: Yes   Marital status:   Divorced                               What year were you married? 1980   Do you live in a house, apartment, assisted living, condo, trailer, etc.?    Is it one or more stories? 1   How many persons live in your home? 1   Do you have pets in your home?( please list) Cat   Highest Level of education completed? Post Grad   Current or past profession: Insurance account manager.   Do you exercise? Little                                 Type and how often?   Do you have a living will? Yes   Do you have a DNR form?  No                           If not, do you want to discuss one?   Do you have signed POA/HPOA forms?   Yes                     If so, please bring to you appointment      Do you have any difficulty bathing or dressing yourself? Yes   Do you have any difficulty preparing food or eating? No   Do you have any difficulty managing your medications? No   Do you have any difficulty managing your finances? No   Do you have any difficulty affording your medications?  No   Social Determinants of Health   Financial Resource Strain: Low Risk  (05/15/2021)   Overall Financial Resource Strain (CARDIA)    Difficulty of Paying Living Expenses: Not hard at all  Food  Insecurity: No Food Insecurity (05/15/2021)   Hunger Vital Sign    Worried About Running Out of Food in the Last Year: Never true    Ran Out of Food in the Last Year: Never true  Transportation Needs: No Transportation Needs (05/15/2021)   PRAPARE - Hydrologist (Medical): No    Lack of Transportation (Non-Medical): No  Physical Activity: Not on file  Stress: Not on file  Social Connections: Not on file     Family History: The patient's family history includes Anxiety disorder in his son; Bipolar disorder in his daughter; Diabetes in his brother.  ROS:   Please see the history of present illness.     All other systems reviewed and are negative.  EKGs/Labs/Other Studies Reviewed:    The following studies were reviewed today:   Recent Labs: 05/12/2021: B Natriuretic Peptide 218.8 05/17/2021: Magnesium 2.2 12/20/2021: ALT 11; BUN 60; Creatinine 2.5; Hemoglobin 10.9; Platelets 282; Potassium 3.5; Sodium 139; TSH 1.58  Recent Lipid Panel    Component Value Date/Time   CHOL 131 12/20/2021 0000   TRIG 104 12/20/2021 0000   HDL 47 12/20/2021 0000   CHOLHDL 3.2 11/02/2021 0451   VLDL 14 11/02/2021 0451   LDLCALC 65 12/20/2021 0000   LDLCALC 38 03/10/2021 1002     Risk Assessment/Calculations:           Physical Exam:    VS:  Vitals:   12/28/21 1059  BP: 118/64  Pulse: 75  SpO2: 94%      BP 118/64   Pulse 75   Ht 6' (1.829 m)   Wt (!) 354 lb (160.6 kg)   SpO2 94%   BMI 48.01 kg/m     Wt Readings from Last 3 Encounters:  12/28/21 (!) 354 lb (160.6 kg)  12/22/21 (!) 350 lb (158.8 kg)  12/19/21 (!) 354 lb (160.6 kg)     GEN:  Well nourished, well developed in no acute distress, morbidly obese with a large hernia, in a wheelchair HEENT: Normal NECK: habitus prohibitive for JVD assessment LYMPHATICS: No lymphadenopathy CARDIAC: RRR, no murmurs, rubs, gallops RESPIRATORY:  Clear to auscultation without rales, wheezing or rhonchi   ABDOMEN: Soft, non-tender, non-distended MUSCULOSKELETAL:  No edema; No deformity  SKIN: Warm and dry NEUROLOGIC:  Alert and oriented x 3 PSYCHIATRIC:  Normal affect   ASSESSMENT:    HFpeF: He is at his baseline today. He's on 2 L at home likely has obesity hypoventilation component for  SOB. Pulmonary HTN Group II and III. His RV is mildly enlarged. Will be important to continue his CPAP and O2 to keep his PA pressures down. His  A1c goal < 7%, on SGLT2.  - 2/2 habitus, exam is very challenging to assess for decompensated CHF, crt up to 2.5 from 1.8, 6 mo ago. BUN 60, crt 2.5 (pre-renal); will defer to nephrology, can reduce lasix dose if needed, can change to torsemide as well - continue compression stockings - continue Jardiance 25 mg daily  S/p ?CVA: continue plavix. No hx of afib; not seen on recent device transmission  Mild Aortic Root Anuerysm: Yearly surveillance. 3.2024  HLD- continue crestor 10 mg daily. Lipids at goal.  SSS: s/p PPM 09/15/2021, DDD. [employer]. Jude.  Has medtronic and [employer]. Jude leads, both MRI. compatible. follow by Ep. First-degree AV block and intermittent second and third-degree AV block junctional escape   HTN: continue imdur 60 mg daily, losartan 25 mg daily PLAN:    In order of problems listed above:  Follow up in 6 months         Medication Adjustments/Labs and Tests Ordered: Current medicines are reviewed at length with the patient today.  Concerns regarding medicines are outlined above.  No orders of the defined types were placed in this encounter.  No orders of the defined types were placed in this encounter.   There are no Patient Instructions on file for this visit.   Signed, Janina Mayo, MD  12/28/2021 11:03 AM    [facility]

## 2021-12-28 NOTE — Telephone Encounter (Signed)
Recommend Protonix 40 mg tablet one by mouth daily.prescription send to Pharmacy.

## 2021-12-29 LAB — BASIC METABOLIC PANEL
BUN: 51 — AB (ref 4–21)
CO2: 28 — AB (ref 13–22)
Chloride: 94 — AB (ref 99–108)
Creatinine: 2.2 — AB (ref 0.6–1.3)
Glucose: 156
Potassium: 3.5 mEq/L (ref 3.5–5.1)
Sodium: 139 (ref 137–147)

## 2021-12-29 LAB — COMPREHENSIVE METABOLIC PANEL
Albumin: 3.9 (ref 3.5–5.0)
Calcium: 8.9 (ref 8.7–10.7)

## 2022-01-05 ENCOUNTER — Other Ambulatory Visit: Payer: Self-pay | Admitting: Nurse Practitioner

## 2022-01-05 NOTE — Telephone Encounter (Signed)
Pharmacy requested refill.  ?Pended Rx and sent to [name] for approval.  ?

## 2022-01-15 ENCOUNTER — Telehealth (INDEPENDENT_AMBULATORY_CARE_PROVIDER_SITE_OTHER): Payer: Medicare PPO | Admitting: Family

## 2022-01-15 ENCOUNTER — Encounter: Payer: Self-pay | Admitting: Family

## 2022-01-15 DIAGNOSIS — R350 Frequency of micturition: Secondary | ICD-10-CM | POA: Diagnosis not present

## 2022-01-15 DIAGNOSIS — M545 Low back pain, unspecified: Secondary | ICD-10-CM | POA: Diagnosis not present

## 2022-01-15 DIAGNOSIS — R3915 Urgency of urination: Secondary | ICD-10-CM | POA: Diagnosis not present

## 2022-01-15 NOTE — Telephone Encounter (Signed)
A message was sent to patient and or family member informing them to call to schedule a video visit to discuss symptoms and have order faxed to home health agency as it should documented as to what symptoms patient is actually having.

## 2022-01-15 NOTE — Telephone Encounter (Signed)
Please fax orders to [facility] health to collect urine specimen for U/A and C/S to rule out urinary tract infection.

## 2022-01-15 NOTE — Progress Notes (Signed)
This service is provided via telemedicine  No vital signs collected/recorded due to the encounter was a telemedicine visit.   Location of patient (ex: home, work):  Home  Patient consents to a telephone visit:  Yes  Location of the provider (ex: office, home):  [employer].   Name of any referring provider:  Wally Shevchenko, Nelda Bucks, NP   Names of all persons participating in the telemedicine service and their role in the encounter:  Patient, Heriberto Antigua, [place], [place], Webb Silversmith, NP.    Time spent on call:  8 minutes spent on the phone with Medical Assistant.      Provider: Marlowe Sax FNP-C  Filippa Yarbough, Nelda Bucks, NP  Patient Care Team: Effa Yarrow, Nelda Bucks, NP as PCP - General (Family Medicine) Janina Mayo, MD as PCP - Cardiology (Cardiology) Deboraha Sprang, MD as PCP - Electrophysiology (Cardiology)  Extended Emergency Contact Information Primary Emergency Contact: Reuben Likes Address: [address]          [place], [place] [address] Johnnette Litter of [place] Phone: [phone] Relation: Friend Secondary Emergency Contact: [place] Mobile Phone: [phone] Relation: Daughter Preferred language: English Interpreter needed? No  Code Status:  Full Code  Goals of care: Advanced Directive information    01/15/2022    3:23 PM  Advanced Directives  Does Patient Have a Medical Advance Directive? Yes  Type of Advance Directive Living will  Does patient want to make changes to medical advance directive? No - Patient declined     Chief Complaint  Patient presents with   Acute Visit    Patient complains of possible UTI. Symptoms are urinary frequency, and lower back pain. Patient states that this has been ongoing for 2 weeks.     HPI:  Pt is a 71 y.o. male seen today for an acute visit for evaluation of increase urine frequency,urgency,dysuria and lower back pain x 2 weeks.He denies any fever,chills,nausea,vomiting,abdominal pain,flank  pain,difficult urination or hematuria.Has been drinking fluid as instructed since has limited amount due to CHF.states home health Nurse can pick up urine specimen and drop to the office lab. He is unable to come to the office since he has to take the bus which takes one hour.   Past Medical History:  Diagnosis Date   Alcohol use disorder in remission    Anxiety and depression    At high risk for falls    Former cigarette smoker    Generalized anxiety disorder    Hard of hearing    Hyperlipidemia due to type 2 diabetes mellitus ([place])    Hypertension    Morbid obesity ([place])    Neuropathy    O2 dependent    Risk for falls    Sleep apnea    Type 2 diabetes mellitus ([place])    Past Surgical History:  Procedure Laterality Date   APPENDECTOMY  1980   IR THORACENTESIS ASP PLEURAL SPACE W/IMG GUIDE  05/18/2021   IR THORACENTESIS ASP PLEURAL SPACE W/IMG GUIDE  10/06/2021   KNEE SURGERY Right    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   Blood Glucose Monitoring Suppl (ONE TOUCH ULTRA MINI) w/Device KIT Use to test blood sugar once daily. Dx:E11.22   calcium carbonate (TUMS - DOSED IN MG ELEMENTAL CALCIUM) 500 MG chewable tablet Chew 1 tablet by mouth daily.   Carboxymethylcellulose Sodium (DRY EYE RELIEF OP) Place 1 drop into both eyes daily as needed (dry eyes).   Cholecalciferol (VITAMIN D3) 50 MCG (2000 UT) capsule Take 0.5 capsules (1,000 Units total) by mouth daily.   clopidogrel (PLAVIX) 75 MG tablet Take 1 tablet (75 mg total) by mouth daily.    cyanocobalamin 1000 MCG tablet Take 1,000 mcg by mouth daily.   DULoxetine (CYMBALTA) 60 MG capsule Take 1 capsule (60 mg total) by mouth daily.   empagliflozin (JARDIANCE) 25 MG TABS tablet TAKE 1 TABLET BY MOUTH EVERY DAY   ferrous sulfate (FEROSUL) 325 (65 FE) MG tablet TAKE 1 TABLET(325 MG) BY MOUTH DAILY WITH BREAKFAST   furosemide (LASIX) 40 MG tablet Take 80 mg by mouth in the morning and at bedtime.   glucose blood test strip Use to test blood sugar once daily. Dx:E11.22   insulin detemir (LEVEMIR FLEXPEN) 100 UNIT/ML FlexPen Inject 42 Units into the skin at bedtime.   Insulin Pen Needle 32G X 4 MM MISC Use to inject insulin twice daily. Dx: E11.22   isosorbide mononitrate (IMDUR) 60 MG 24 hr tablet Take 60 mg by mouth daily.   ketoconazole (NIZORAL) 2 % cream Apply 1 application topically as needed for irritation.   Lancet Device MISC 1 Device by Does not apply route daily.   Lancets MISC 1 Device by Does not apply route as directed. Use as directed.   Loperamide HCl (IMODIUM PO) Take 6 mg by mouth 3 (three) times daily with meals.   losartan (COZAAR) 25 MG tablet Take 25 mg by mouth daily.   NYSTATIN powder Apply 1 application  topically daily as needed (Yeast infection to afffected area).   OVER THE COUNTER MEDICATION Apply 1 Application topically every other day. Head and shoulder medicated shampoo alternate with selsun blue medicated   OXYGEN Inhale 2 L into the lungs continuous.   pantoprazole (PROTONIX) 40 MG tablet Take 1 tablet (40 mg total) by mouth daily.   potassium chloride SA (KLOR-CON M) 20 MEQ tablet Take 20 mEq by mouth 2 (two) times daily.   Red Yeast Rice 600 MG CAPS Take 1,200 mg by mouth daily.   rosuvastatin (CRESTOR) 10 MG tablet TAKE 1 TABLET BY MOUTH EVERY DAY   simethicone (MYLICON) 80 MG chewable tablet Chew 80 mg by mouth every 6 (six) hours as needed for flatulence.   topiramate (TOPAMAX) 25 MG tablet TAKE 1 TABLET BY MOUTH EVERY DAY   traMADol (ULTRAM)  50 MG tablet TAKE 1 TABLET(50 MG) BY MOUTH TWICE DAILY AS NEEDED   traZODone (DESYREL) 50 MG tablet TAKE 2 TABLETS(100 MG) BY MOUTH AT BEDTIME   triamcinolone cream (KENALOG) 0.1 % Apply 1 application. topically 2 (two) times daily. Apply to affected areas on shin area   TURMERIC PO Take 1,000 mg by mouth daily.   VICTOZA 18 MG/3ML SOPN ADMINISTER 1.8 MG UNDER THE SKIN EVERY DAY   No facility-administered encounter medications on file as of 01/15/2022.    Review of Systems  Constitutional:  Negative for appetite change, chills, fatigue, fever and unexpected weight change.  Respiratory:  Negative for cough, chest tightness, shortness of breath and  wheezing.   Gastrointestinal:  Negative for abdominal distention, abdominal pain, nausea and vomiting.  Genitourinary:  Positive for dysuria, frequency and urgency. Negative for difficulty urinating and flank pain.  Musculoskeletal:  Positive for gait problem. Negative for arthralgias and back pain.       Lower back pain   Psychiatric/Behavioral:  Negative for agitation, behavioral problems, confusion and hallucinations. The patient is not nervous/anxious.     Immunization History  Administered Date(s) Administered   Fluad Quad(high Dose 65+) 11/19/2020, 11/02/2021   H1N1 01/24/2008   Moderna Sars-Covid-2 Vaccination 04/03/2019, 05/05/2019, 12/20/2019, 05/23/2020   Zoster Recombinat (Shingrix) 03/01/2020   Pertinent  Health Maintenance Due  Topic Date Due   FOOT EXAM  Never done   OPHTHALMOLOGY EXAM  Never done   HEMOGLOBIN A1C  06/20/2022   INFLUENZA VACCINE  Completed   COLONOSCOPY (Pts 45-48yr Insurance coverage will need to be confirmed)  Discontinued      11/01/2021    1:00 PM 11/02/2021    1:00 AM 11/02/2021    8:00 AM 12/22/2021    9:34 AM 01/15/2022    3:22 PM  [place] the past year?    0 0  Was there an injury with Fall?    0 0  Fall Risk Category Calculator    0 0  Fall Risk Category    Low Low  Patient Fall  Risk Level High fall risk High fall risk High fall risk Low fall risk Low fall risk  Patient at Risk for Falls Due to    No Fall Risks No Fall Risks  Fall risk Follow up    Falls evaluation completed Falls evaluation completed   Functional Status Survey:    There were no vitals filed for this visit. There is no height or weight on file to calculate BMI. Physical Exam  Unable to complete on telephone visit.   Labs reviewed: Recent Labs    05/17/21 0356 05/18/21 0552 09/08/21 0910 09/11/21 0000 09/15/21 0753 10/31/21 1920 12/20/21 0000 12/29/21 0000  NA 138   < > 135   < > 137 135 139 139  K 4.2   < > 3.1*   < > 4.2 3.6 3.5 3.5  CL 104   < > 90*   < > 103 93* 86* 94*  CO2 27   < > 29   < > 24 30 27* 28*  GLUCOSE 107*   < > 190*  --  135* 295*  --   --   BUN 29*   < > 71*   < > 48* 43* 60* 51*  CREATININE 1.96*   < > 2.49*   < > 2.25* 2.33* 2.5* 2.2*  CALCIUM 8.2*   < > 8.8   < > 8.5* 9.0 9.5 8.9  MG 2.2  --   --   --   --   --   --   --    < > = values in this interval not displayed.   Recent Labs    03/10/21 1002 03/10/21 1002 05/12/21 2050 06/13/21 0000 06/27/21 0000 10/31/21 1920 12/20/21 0000 12/29/21 0000  AST 14  --  13* 15  --  13* 14  --   ALT 9  --  7 10  --  11 11  --   ALKPHOS  --    < > 49 75  --  70 81  --   BILITOT 0.5  --  0.5  --   --  0.4  --   --   PROT 6.7  --  7.0  --   --  7.2  --   --   ALBUMIN  --   --  3.8  --    < > 3.8 3.8 3.9   < > = values in this interval not displayed.   Recent Labs    03/10/21 1002 05/12/21 1830 05/23/21 1540 06/27/21 0000 09/08/21 0910 10/31/21 1920 12/20/21 0000  WBC 3.4*   < > 4.8   < > 6.2 7.9 7.6  NEUTROABS 1,941  --  3,134  --   --  5.6  --   HGB 10.3*   < > 10.1*   < > 10.7* 10.6* 10.9*  HCT 33.7*   < > 32.1*   < > 33.4* 32.6* 34*  MCV 97.4   < > 97.3  --  93 94.2  --   PLT 130*   < > 194   < > 221 239 282   < > = values in this interval not displayed.   Lab Results  Component Value Date   TSH  1.58 12/20/2021   Lab Results  Component Value Date   HGBA1C 8.0 12/20/2021   Lab Results  Component Value Date   CHOL 131 12/20/2021   HDL 47 12/20/2021   LDLCALC 65 12/20/2021   TRIG 104 12/20/2021   CHOLHDL 3.2 11/02/2021    Significant Diagnostic Results in last 30 days:  CUP PACEART INCLINIC DEVICE CHECK  Result Date: 12/19/2021 Pacemaker check in clinic. Normal device function. Thresholds, sensing, impedances consistent with previous measurements. Device programmed to maximize longevity. AMS episode 8 seconds. Device programmed at appropriate safety margins. Histogram distribution appropriate for patient activity level. Device programmed to optimize intrinsic conduction. Estimated longevity 8-8.5 years. Patient enrolled in remote follow-up. Patient education completed.Myrtie Hawk, BSN, RN   Assessment/Plan 1. Urinary frequency Afebrile  [facility] health Nurse to collect urine specimen then bring specimen to [facility] office for CMA to do dipstick and send for culture.  - Urine Culture - POC Urinalysis Dipstick - Orders faxed by Lilli Light to [facility] Nurse attention Beth per patient's request.   2. Low back pain, unspecified back pain laterality, unspecified chronicity, unspecified whether sciatica present Suspect due to ongoing urine frequency possible UTI  - Urine Culture  3. Urgency of urination HH Nurse to collect urine specimen as above.  - Urine Culture - POC Urinalysis Dipstick  Family/ staff Communication: Reviewed plan of care with patient verbalized understanding.   Labs/tests ordered:  - Urine Culture - POC Urinalysis Dipstick  Next Appointment: Return if symptoms worsen or fail to improve.   I connected with  Maryellen Pile on 01/15/22 by Telephone enabled telemedicine application and verified that I am speaking with the correct person using two identifiers.   I discussed the limitations of evaluation and management by telemedicine. The  patient expressed understanding and agreed to proceed.  Spent 10 minutes of non-face to face with patient  >50% time spent counseling; reviewing medical record; labs; and developing future plan of care.      [name], NP

## 2022-01-16 ENCOUNTER — Telehealth: Payer: Self-pay

## 2022-01-16 DIAGNOSIS — R399 Unspecified symptoms and signs involving the genitourinary system: Secondary | ICD-10-CM | POA: Diagnosis not present

## 2022-01-16 DIAGNOSIS — N390 Urinary tract infection, site not specified: Secondary | ICD-10-CM | POA: Diagnosis not present

## 2022-01-16 NOTE — Telephone Encounter (Signed)
Noted  

## 2022-01-16 NOTE — Telephone Encounter (Signed)
Beth w/ [name] called stating that they could not do an POC dip stick on patient but she did place a order for urinalysis and urine culture for the patient and orders will be send to [place] Ngetich,NP when they receive back from lab.

## 2022-01-23 DIAGNOSIS — M6281 Muscle weakness (generalized): Secondary | ICD-10-CM | POA: Diagnosis not present

## 2022-01-23 DIAGNOSIS — R262 Difficulty in walking, not elsewhere classified: Secondary | ICD-10-CM | POA: Diagnosis not present

## 2022-01-24 ENCOUNTER — Telehealth: Payer: Self-pay | Admitting: Family

## 2022-01-24 DIAGNOSIS — G4733 Obstructive sleep apnea (adult) (pediatric): Secondary | ICD-10-CM | POA: Diagnosis not present

## 2022-01-24 NOTE — Telephone Encounter (Signed)
Urine analysis results negative for UTI awaiting final culture results.

## 2022-01-25 DIAGNOSIS — M6281 Muscle weakness (generalized): Secondary | ICD-10-CM | POA: Diagnosis not present

## 2022-01-25 DIAGNOSIS — R2689 Other abnormalities of gait and mobility: Secondary | ICD-10-CM | POA: Diagnosis not present

## 2022-01-25 MED ORDER — ISOSORBIDE MONONITRATE ER 60 MG PO TB24: 60.0000 mg | ORAL_TABLET | Freq: Every day | ORAL | 1 refills | Status: DC

## 2022-01-26 DIAGNOSIS — R262 Difficulty in walking, not elsewhere classified: Secondary | ICD-10-CM | POA: Diagnosis not present

## 2022-01-26 DIAGNOSIS — M6281 Muscle weakness (generalized): Secondary | ICD-10-CM | POA: Diagnosis not present

## 2022-01-29 DIAGNOSIS — M6281 Muscle weakness (generalized): Secondary | ICD-10-CM | POA: Diagnosis not present

## 2022-01-29 DIAGNOSIS — R262 Difficulty in walking, not elsewhere classified: Secondary | ICD-10-CM | POA: Diagnosis not present

## 2022-01-31 ENCOUNTER — Other Ambulatory Visit: Payer: Self-pay | Admitting: Family

## 2022-02-01 DIAGNOSIS — R262 Difficulty in walking, not elsewhere classified: Secondary | ICD-10-CM | POA: Diagnosis not present

## 2022-02-01 DIAGNOSIS — M6281 Muscle weakness (generalized): Secondary | ICD-10-CM | POA: Diagnosis not present

## 2022-02-05 DIAGNOSIS — M6281 Muscle weakness (generalized): Secondary | ICD-10-CM | POA: Diagnosis not present

## 2022-02-05 DIAGNOSIS — R262 Difficulty in walking, not elsewhere classified: Secondary | ICD-10-CM | POA: Diagnosis not present

## 2022-02-06 DIAGNOSIS — R262 Difficulty in walking, not elsewhere classified: Secondary | ICD-10-CM | POA: Diagnosis not present

## 2022-02-06 DIAGNOSIS — R2689 Other abnormalities of gait and mobility: Secondary | ICD-10-CM | POA: Diagnosis not present

## 2022-02-06 DIAGNOSIS — M6281 Muscle weakness (generalized): Secondary | ICD-10-CM | POA: Diagnosis not present

## 2022-02-08 ENCOUNTER — Inpatient Hospital Stay (HOSPITAL_COMMUNITY)
Admission: EM | Admit: 2022-02-08 | Discharge: 2022-02-13 | DRG: 389 | Disposition: A | Discharge status: Home Health | Payer: Medicare PPO | Source: Skilled Nursing Facility | Attending: Internal Medicine | Admitting: Internal Medicine

## 2022-02-08 DIAGNOSIS — Z794 Long term (current) use of insulin: Secondary | ICD-10-CM

## 2022-02-08 DIAGNOSIS — R739 Hyperglycemia, unspecified: Secondary | ICD-10-CM | POA: Diagnosis not present

## 2022-02-08 DIAGNOSIS — I5022 Chronic systolic (congestive) heart failure: Secondary | ICD-10-CM | POA: Diagnosis not present

## 2022-02-08 DIAGNOSIS — K56609 Unspecified intestinal obstruction, unspecified as to partial versus complete obstruction: Secondary | ICD-10-CM | POA: Diagnosis present

## 2022-02-08 DIAGNOSIS — R45851 Suicidal ideations: Secondary | ICD-10-CM | POA: Diagnosis not present

## 2022-02-08 DIAGNOSIS — K432 Incisional hernia without obstruction or gangrene: Secondary | ICD-10-CM | POA: Diagnosis present

## 2022-02-08 DIAGNOSIS — I10 Essential (primary) hypertension: Secondary | ICD-10-CM | POA: Diagnosis present

## 2022-02-08 DIAGNOSIS — I495 Sick sinus syndrome: Secondary | ICD-10-CM | POA: Diagnosis present

## 2022-02-08 DIAGNOSIS — E860 Dehydration: Secondary | ICD-10-CM | POA: Diagnosis present

## 2022-02-08 DIAGNOSIS — Z950 Presence of cardiac pacemaker: Secondary | ICD-10-CM

## 2022-02-08 DIAGNOSIS — Z833 Family history of diabetes mellitus: Secondary | ICD-10-CM

## 2022-02-08 DIAGNOSIS — F32A Depression, unspecified: Secondary | ICD-10-CM | POA: Diagnosis present

## 2022-02-08 DIAGNOSIS — Z9981 Dependence on supplemental oxygen: Secondary | ICD-10-CM | POA: Diagnosis not present

## 2022-02-08 DIAGNOSIS — R0602 Shortness of breath: Secondary | ICD-10-CM | POA: Diagnosis not present

## 2022-02-08 DIAGNOSIS — K7460 Unspecified cirrhosis of liver: Secondary | ICD-10-CM | POA: Diagnosis present

## 2022-02-08 DIAGNOSIS — Z79899 Other long term (current) drug therapy: Secondary | ICD-10-CM

## 2022-02-08 DIAGNOSIS — M19011 Primary osteoarthritis, right shoulder: Secondary | ICD-10-CM | POA: Diagnosis not present

## 2022-02-08 DIAGNOSIS — Z4659 Encounter for fitting and adjustment of other gastrointestinal appliance and device: Secondary | ICD-10-CM | POA: Diagnosis not present

## 2022-02-08 DIAGNOSIS — N1832 Chronic kidney disease, stage 3b: Secondary | ICD-10-CM | POA: Diagnosis not present

## 2022-02-08 DIAGNOSIS — E119 Type 2 diabetes mellitus without complications: Secondary | ICD-10-CM | POA: Diagnosis not present

## 2022-02-08 DIAGNOSIS — Z6841 Body Mass Index (BMI) 40.0 and over, adult: Secondary | ICD-10-CM | POA: Diagnosis not present

## 2022-02-08 DIAGNOSIS — F1011 Alcohol abuse, in remission: Secondary | ICD-10-CM | POA: Diagnosis present

## 2022-02-08 DIAGNOSIS — K439 Ventral hernia without obstruction or gangrene: Secondary | ICD-10-CM | POA: Diagnosis not present

## 2022-02-08 DIAGNOSIS — K5669 Other partial intestinal obstruction: Secondary | ICD-10-CM | POA: Diagnosis not present

## 2022-02-08 DIAGNOSIS — I509 Heart failure, unspecified: Secondary | ICD-10-CM | POA: Diagnosis not present

## 2022-02-08 DIAGNOSIS — I2510 Atherosclerotic heart disease of native coronary artery without angina pectoris: Secondary | ICD-10-CM | POA: Diagnosis present

## 2022-02-08 DIAGNOSIS — J9610 Chronic respiratory failure, unspecified whether with hypoxia or hypercapnia: Secondary | ICD-10-CM | POA: Diagnosis present

## 2022-02-08 DIAGNOSIS — E1140 Type 2 diabetes mellitus with diabetic neuropathy, unspecified: Secondary | ICD-10-CM | POA: Diagnosis present

## 2022-02-08 DIAGNOSIS — Z993 Dependence on wheelchair: Secondary | ICD-10-CM | POA: Diagnosis not present

## 2022-02-08 DIAGNOSIS — K436 Other and unspecified ventral hernia with obstruction, without gangrene: Secondary | ICD-10-CM | POA: Diagnosis not present

## 2022-02-08 DIAGNOSIS — I5042 Chronic combined systolic (congestive) and diastolic (congestive) heart failure: Secondary | ICD-10-CM | POA: Diagnosis present

## 2022-02-08 DIAGNOSIS — I130 Hypertensive heart and chronic kidney disease with heart failure and stage 1 through stage 4 chronic kidney disease, or unspecified chronic kidney disease: Secondary | ICD-10-CM | POA: Diagnosis present

## 2022-02-08 DIAGNOSIS — Z87891 Personal history of nicotine dependence: Secondary | ICD-10-CM

## 2022-02-08 DIAGNOSIS — K2289 Other specified disease of esophagus: Secondary | ICD-10-CM | POA: Diagnosis not present

## 2022-02-08 DIAGNOSIS — Z9049 Acquired absence of other specified parts of digestive tract: Secondary | ICD-10-CM

## 2022-02-08 DIAGNOSIS — E785 Hyperlipidemia, unspecified: Secondary | ICD-10-CM | POA: Diagnosis present

## 2022-02-08 DIAGNOSIS — Z7902 Long term (current) use of antithrombotics/antiplatelets: Secondary | ICD-10-CM

## 2022-02-08 DIAGNOSIS — N179 Acute kidney failure, unspecified: Secondary | ICD-10-CM | POA: Diagnosis present

## 2022-02-08 DIAGNOSIS — Z818 Family history of other mental and behavioral disorders: Secondary | ICD-10-CM

## 2022-02-08 DIAGNOSIS — Z452 Encounter for adjustment and management of vascular access device: Secondary | ICD-10-CM | POA: Diagnosis not present

## 2022-02-08 DIAGNOSIS — Z933 Colostomy status: Secondary | ICD-10-CM | POA: Diagnosis not present

## 2022-02-08 DIAGNOSIS — M542 Cervicalgia: Secondary | ICD-10-CM | POA: Diagnosis present

## 2022-02-08 DIAGNOSIS — R609 Edema, unspecified: Secondary | ICD-10-CM | POA: Diagnosis not present

## 2022-02-08 DIAGNOSIS — N289 Disorder of kidney and ureter, unspecified: Secondary | ICD-10-CM | POA: Diagnosis not present

## 2022-02-08 DIAGNOSIS — R32 Unspecified urinary incontinence: Secondary | ICD-10-CM | POA: Diagnosis present

## 2022-02-08 DIAGNOSIS — E1122 Type 2 diabetes mellitus with diabetic chronic kidney disease: Secondary | ICD-10-CM | POA: Diagnosis present

## 2022-02-08 DIAGNOSIS — N184 Chronic kidney disease, stage 4 (severe): Secondary | ICD-10-CM | POA: Diagnosis present

## 2022-02-08 DIAGNOSIS — Z955 Presence of coronary angioplasty implant and graft: Secondary | ICD-10-CM

## 2022-02-08 DIAGNOSIS — K3189 Other diseases of stomach and duodenum: Secondary | ICD-10-CM | POA: Diagnosis not present

## 2022-02-08 DIAGNOSIS — Z4682 Encounter for fitting and adjustment of non-vascular catheter: Secondary | ICD-10-CM | POA: Diagnosis not present

## 2022-02-08 DIAGNOSIS — Z8673 Personal history of transient ischemic attack (TIA), and cerebral infarction without residual deficits: Secondary | ICD-10-CM

## 2022-02-08 DIAGNOSIS — G4733 Obstructive sleep apnea (adult) (pediatric): Secondary | ICD-10-CM | POA: Diagnosis present

## 2022-02-08 DIAGNOSIS — F411 Generalized anxiety disorder: Secondary | ICD-10-CM | POA: Diagnosis present

## 2022-02-08 DIAGNOSIS — E1169 Type 2 diabetes mellitus with other specified complication: Secondary | ICD-10-CM | POA: Diagnosis present

## 2022-02-08 DIAGNOSIS — E861 Hypovolemia: Secondary | ICD-10-CM | POA: Diagnosis present

## 2022-02-08 DIAGNOSIS — J9611 Chronic respiratory failure with hypoxia: Secondary | ICD-10-CM | POA: Diagnosis not present

## 2022-02-08 DIAGNOSIS — K6389 Other specified diseases of intestine: Secondary | ICD-10-CM | POA: Diagnosis not present

## 2022-02-08 DIAGNOSIS — D649 Anemia, unspecified: Secondary | ICD-10-CM | POA: Diagnosis not present

## 2022-02-09 ENCOUNTER — Inpatient Hospital Stay (HOSPITAL_COMMUNITY): Payer: Medicare PPO

## 2022-02-09 ENCOUNTER — Encounter (HOSPITAL_COMMUNITY): Payer: Self-pay

## 2022-02-09 ENCOUNTER — Emergency Department (HOSPITAL_COMMUNITY): Payer: Medicare PPO

## 2022-02-09 ENCOUNTER — Other Ambulatory Visit: Payer: Self-pay

## 2022-02-09 DIAGNOSIS — I10 Essential (primary) hypertension: Secondary | ICD-10-CM | POA: Diagnosis not present

## 2022-02-09 DIAGNOSIS — F32A Depression, unspecified: Secondary | ICD-10-CM | POA: Diagnosis present

## 2022-02-09 DIAGNOSIS — N179 Acute kidney failure, unspecified: Secondary | ICD-10-CM | POA: Diagnosis present

## 2022-02-09 DIAGNOSIS — I130 Hypertensive heart and chronic kidney disease with heart failure and stage 1 through stage 4 chronic kidney disease, or unspecified chronic kidney disease: Secondary | ICD-10-CM | POA: Diagnosis present

## 2022-02-09 DIAGNOSIS — E785 Hyperlipidemia, unspecified: Secondary | ICD-10-CM | POA: Diagnosis present

## 2022-02-09 DIAGNOSIS — N184 Chronic kidney disease, stage 4 (severe): Secondary | ICD-10-CM | POA: Diagnosis present

## 2022-02-09 DIAGNOSIS — I5042 Chronic combined systolic (congestive) and diastolic (congestive) heart failure: Secondary | ICD-10-CM | POA: Diagnosis present

## 2022-02-09 DIAGNOSIS — I5022 Chronic systolic (congestive) heart failure: Secondary | ICD-10-CM

## 2022-02-09 DIAGNOSIS — Z9981 Dependence on supplemental oxygen: Secondary | ICD-10-CM | POA: Diagnosis not present

## 2022-02-09 DIAGNOSIS — J9610 Chronic respiratory failure, unspecified whether with hypoxia or hypercapnia: Secondary | ICD-10-CM | POA: Diagnosis present

## 2022-02-09 DIAGNOSIS — Z794 Long term (current) use of insulin: Secondary | ICD-10-CM | POA: Diagnosis not present

## 2022-02-09 DIAGNOSIS — E1140 Type 2 diabetes mellitus with diabetic neuropathy, unspecified: Secondary | ICD-10-CM | POA: Diagnosis present

## 2022-02-09 DIAGNOSIS — Z87891 Personal history of nicotine dependence: Secondary | ICD-10-CM | POA: Diagnosis not present

## 2022-02-09 DIAGNOSIS — Z79899 Other long term (current) drug therapy: Secondary | ICD-10-CM | POA: Diagnosis not present

## 2022-02-09 DIAGNOSIS — Z6841 Body Mass Index (BMI) 40.0 and over, adult: Secondary | ICD-10-CM | POA: Diagnosis not present

## 2022-02-09 DIAGNOSIS — R45851 Suicidal ideations: Secondary | ICD-10-CM | POA: Diagnosis not present

## 2022-02-09 DIAGNOSIS — E860 Dehydration: Secondary | ICD-10-CM | POA: Diagnosis present

## 2022-02-09 DIAGNOSIS — K56609 Unspecified intestinal obstruction, unspecified as to partial versus complete obstruction: Principal | ICD-10-CM

## 2022-02-09 DIAGNOSIS — E1169 Type 2 diabetes mellitus with other specified complication: Secondary | ICD-10-CM | POA: Diagnosis present

## 2022-02-09 DIAGNOSIS — Z993 Dependence on wheelchair: Secondary | ICD-10-CM | POA: Diagnosis not present

## 2022-02-09 DIAGNOSIS — K436 Other and unspecified ventral hernia with obstruction, without gangrene: Secondary | ICD-10-CM

## 2022-02-09 DIAGNOSIS — F1011 Alcohol abuse, in remission: Secondary | ICD-10-CM | POA: Diagnosis present

## 2022-02-09 DIAGNOSIS — I495 Sick sinus syndrome: Secondary | ICD-10-CM | POA: Diagnosis present

## 2022-02-09 DIAGNOSIS — Z933 Colostomy status: Secondary | ICD-10-CM | POA: Diagnosis not present

## 2022-02-09 DIAGNOSIS — K7460 Unspecified cirrhosis of liver: Secondary | ICD-10-CM | POA: Diagnosis present

## 2022-02-09 DIAGNOSIS — E1122 Type 2 diabetes mellitus with diabetic chronic kidney disease: Secondary | ICD-10-CM | POA: Diagnosis present

## 2022-02-09 LAB — CBC WITH DIFFERENTIAL/PLATELET
Abs Immature Granulocytes: 0.05 10*3/uL (ref 0.00–0.07)
Abs Immature Granulocytes: 0.05 10*3/uL (ref 0.00–0.07)
Basophils Absolute: 0 10*3/uL (ref 0.0–0.1)
Basophils Absolute: 0 10*3/uL (ref 0.0–0.1)
Basophils Relative: 0 %
Basophils Relative: 0 %
Eosinophils Absolute: 0 10*3/uL (ref 0.0–0.5)
Eosinophils Absolute: 0.1 10*3/uL (ref 0.0–0.5)
Eosinophils Relative: 0 %
Eosinophils Relative: 1 %
HCT: 33.6 % — ABNORMAL LOW (ref 39.0–52.0)
HCT: 34.4 % — ABNORMAL LOW (ref 39.0–52.0)
Hemoglobin: 10.5 g/dL — ABNORMAL LOW (ref 13.0–17.0)
Hemoglobin: 11 g/dL — ABNORMAL LOW (ref 13.0–17.0)
Immature Granulocytes: 0 %
Immature Granulocytes: 1 %
Lymphocytes Relative: 3 %
Lymphocytes Relative: 8 %
Lymphs Abs: 0.4 10*3/uL — ABNORMAL LOW (ref 0.7–4.0)
Lymphs Abs: 0.8 10*3/uL (ref 0.7–4.0)
MCH: 28.4 pg (ref 26.0–34.0)
MCH: 28.8 pg (ref 26.0–34.0)
MCHC: 31.3 g/dL (ref 30.0–36.0)
MCHC: 32 g/dL (ref 30.0–36.0)
MCV: 90.1 fL (ref 80.0–100.0)
MCV: 90.8 fL (ref 80.0–100.0)
Monocytes Absolute: 0.7 10*3/uL (ref 0.1–1.0)
Monocytes Absolute: 1 10*3/uL (ref 0.1–1.0)
Monocytes Relative: 6 %
Monocytes Relative: 9 %
Neutro Abs: 10.7 10*3/uL — ABNORMAL HIGH (ref 1.7–7.7)
Neutro Abs: 8.5 10*3/uL — ABNORMAL HIGH (ref 1.7–7.7)
Neutrophils Relative %: 81 %
Neutrophils Relative %: 91 %
Platelets: 245 10*3/uL (ref 150–400)
Platelets: 249 10*3/uL (ref 150–400)
RBC: 3.7 MIL/uL — ABNORMAL LOW (ref 4.22–5.81)
RBC: 3.82 MIL/uL — ABNORMAL LOW (ref 4.22–5.81)
RDW: 15.3 % (ref 11.5–15.5)
RDW: 15.5 % (ref 11.5–15.5)
WBC: 10.5 10*3/uL (ref 4.0–10.5)
WBC: 11.9 10*3/uL — ABNORMAL HIGH (ref 4.0–10.5)
nRBC: 0 % (ref 0.0–0.2)
nRBC: 0 % (ref 0.0–0.2)

## 2022-02-09 LAB — COMPREHENSIVE METABOLIC PANEL
ALT: 15 U/L (ref 0–44)
ALT: 14 U/L (ref 0–44)
AST: 20 U/L (ref 15–41)
AST: 20 U/L (ref 15–41)
Albumin: 3.5 g/dL (ref 3.5–5.0)
Albumin: 3.5 g/dL (ref 3.5–5.0)
Alkaline Phosphatase: 59 U/L (ref 38–126)
Alkaline Phosphatase: 65 U/L (ref 38–126)
Anion gap: 13 (ref 5–15)
Anion gap: 15 (ref 5–15)
BUN: 34 mg/dL — ABNORMAL HIGH (ref 8–23)
BUN: 37 mg/dL — ABNORMAL HIGH (ref 8–23)
CO2: 26 mmol/L (ref 22–32)
CO2: 25 mmol/L (ref 22–32)
Calcium: 9.1 mg/dL (ref 8.9–10.3)
Calcium: 9.3 mg/dL (ref 8.9–10.3)
Chloride: 96 mmol/L — ABNORMAL LOW (ref 98–111)
Chloride: 94 mmol/L — ABNORMAL LOW (ref 98–111)
Creatinine, Ser: 2.22 mg/dL — ABNORMAL HIGH (ref 0.61–1.24)
Creatinine, Ser: 2.34 mg/dL — ABNORMAL HIGH (ref 0.61–1.24)
GFR, Estimated: 31 mL/min — ABNORMAL LOW (ref >60)
GFR, Estimated: 29 mL/min — ABNORMAL LOW (ref >60)
Glucose, Bld: 270 mg/dL — ABNORMAL HIGH (ref 70–99)
Glucose, Bld: 316 mg/dL — ABNORMAL HIGH (ref 70–99)
Potassium: 3.4 mmol/L — ABNORMAL LOW (ref 3.5–5.1)
Potassium: 3.5 mmol/L (ref 3.5–5.1)
Sodium: 135 mmol/L (ref 135–145)
Sodium: 134 mmol/L — ABNORMAL LOW (ref 135–145)
Total Bilirubin: 0.7 mg/dL (ref 0.3–1.2)
Total Bilirubin: 0.8 mg/dL (ref 0.3–1.2)
Total Protein: 7.2 g/dL (ref 6.5–8.1)
Total Protein: 7.5 g/dL (ref 6.5–8.1)

## 2022-02-09 LAB — GLUCOSE, CAPILLARY
Glucose-Capillary: 247 mg/dL — ABNORMAL HIGH (ref 70–99)
Glucose-Capillary: 252 mg/dL — ABNORMAL HIGH (ref 70–99)
Glucose-Capillary: 290 mg/dL — ABNORMAL HIGH (ref 70–99)
Glucose-Capillary: 318 mg/dL — ABNORMAL HIGH (ref 70–99)
Glucose-Capillary: 339 mg/dL — ABNORMAL HIGH (ref 70–99)

## 2022-02-09 LAB — BRAIN NATRIURETIC PEPTIDE: B Natriuretic Peptide: 48 pg/mL (ref 0.0–100.0)

## 2022-02-09 LAB — CBG MONITORING, ED: Glucose-Capillary: 323 mg/dL — ABNORMAL HIGH (ref 70–99)

## 2022-02-09 LAB — MAGNESIUM: Magnesium: 2.7 mg/dL — ABNORMAL HIGH (ref 1.7–2.4)

## 2022-02-09 LAB — LIPASE, BLOOD: Lipase: 55 U/L — ABNORMAL HIGH (ref 11–51)

## 2022-02-09 MED ORDER — ONDANSETRON HCL 4 MG/2ML IJ SOLN
4.0000 mg | Freq: Once | INTRAMUSCULAR | Status: AC
  Administered 2022-02-09: 4 mg via INTRAVENOUS
  Filled 2022-02-09: qty 2

## 2022-02-09 MED ORDER — INSULIN GLARGINE-YFGN 100 UNIT/ML ~~LOC~~ SOLN
10.0000 [IU] | Freq: Every day | SUBCUTANEOUS | Status: DC
  Filled 2022-02-09: qty 0.1

## 2022-02-09 MED ORDER — ROSUVASTATIN CALCIUM 5 MG PO TABS
10.0000 mg | ORAL_TABLET | Freq: Every day | ORAL | Status: DC
  Administered 2022-02-09 – 2022-02-13 (×4): 10 mg via ORAL
  Filled 2022-02-09 (×4): qty 2

## 2022-02-09 MED ORDER — ONDANSETRON HCL 4 MG/2ML IJ SOLN
4.0000 mg | Freq: Four times a day (QID) | INTRAMUSCULAR | Status: DC | PRN
  Administered 2022-02-09 – 2022-02-12 (×8): 4 mg via INTRAVENOUS
  Filled 2022-02-09 (×10): qty 2

## 2022-02-09 MED ORDER — INSULIN ASPART 100 UNIT/ML IJ SOLN
0.0000 [IU] | INTRAMUSCULAR | Status: DC
  Administered 2022-02-09: 11 [IU] via SUBCUTANEOUS
  Administered 2022-02-09: 5 [IU] via SUBCUTANEOUS
  Administered 2022-02-09 (×2): 8 [IU] via SUBCUTANEOUS
  Administered 2022-02-09: 11 [IU] via SUBCUTANEOUS
  Administered 2022-02-10: 5 [IU] via SUBCUTANEOUS
  Administered 2022-02-10: 11 [IU] via SUBCUTANEOUS
  Administered 2022-02-10 (×2): 8 [IU] via SUBCUTANEOUS
  Administered 2022-02-10 (×2): 5 [IU] via SUBCUTANEOUS
  Administered 2022-02-11: 3 [IU] via SUBCUTANEOUS
  Administered 2022-02-11: 5 [IU] via SUBCUTANEOUS
  Administered 2022-02-11: 8 [IU] via SUBCUTANEOUS
  Administered 2022-02-11 (×2): 3 [IU] via SUBCUTANEOUS
  Administered 2022-02-11 – 2022-02-12 (×2): 5 [IU] via SUBCUTANEOUS
  Administered 2022-02-12: 3 [IU] via SUBCUTANEOUS
  Administered 2022-02-12 (×2): 2 [IU] via SUBCUTANEOUS
  Administered 2022-02-12: 3 [IU] via SUBCUTANEOUS
  Administered 2022-02-12: 2 [IU] via SUBCUTANEOUS
  Administered 2022-02-13 (×2): 3 [IU] via SUBCUTANEOUS
  Administered 2022-02-13: 2 [IU] via SUBCUTANEOUS
  Administered 2022-02-13: 8 [IU] via SUBCUTANEOUS

## 2022-02-09 MED ORDER — LACTATED RINGERS IV SOLN: INTRAVENOUS | Status: DC

## 2022-02-09 MED ORDER — IOHEXOL 350 MG/ML SOLN
60.0000 mL | Freq: Once | INTRAVENOUS | Status: AC | PRN
  Administered 2022-02-09: 60 mL via INTRAVENOUS

## 2022-02-09 MED ORDER — ALFUZOSIN HCL ER 10 MG PO TB24
10.0000 mg | ORAL_TABLET | Freq: Every day | ORAL | Status: DC
  Administered 2022-02-11 – 2022-02-13 (×3): 10 mg via ORAL
  Filled 2022-02-09 (×4): qty 1

## 2022-02-09 MED ORDER — POTASSIUM CHLORIDE 2 MEQ/ML IV SOLN
INTRAVENOUS | Status: DC
  Filled 2022-02-09 (×2): qty 1000

## 2022-02-09 MED ORDER — PHENOL 1.4 % MT LIQD: 2.0000 | OROMUCOSAL | Status: DC | PRN

## 2022-02-09 MED ORDER — HYDROMORPHONE HCL 1 MG/ML IJ SOLN
1.0000 mg | Freq: Once | INTRAMUSCULAR | Status: AC
  Administered 2022-02-09: 1 mg via INTRAVENOUS
  Filled 2022-02-09: qty 1

## 2022-02-09 MED ORDER — CLONIDINE HCL 0.2 MG/24HR TD PTWK
0.2000 mg | MEDICATED_PATCH | TRANSDERMAL | Status: DC
  Filled 2022-02-09 (×2): qty 1

## 2022-02-09 MED ORDER — INSULIN GLARGINE-YFGN 100 UNIT/ML ~~LOC~~ SOLN
20.0000 [IU] | Freq: Every day | SUBCUTANEOUS | Status: DC
  Filled 2022-02-09 (×2): qty 0.2

## 2022-02-09 MED ORDER — TOPIRAMATE 25 MG PO TABS
25.0000 mg | ORAL_TABLET | Freq: Every day | ORAL | Status: DC
  Administered 2022-02-09 – 2022-02-13 (×4): 25 mg via ORAL
  Filled 2022-02-09 (×4): qty 1

## 2022-02-09 MED ORDER — ALPRAZOLAM 0.5 MG PO TABS
0.5000 mg | ORAL_TABLET | Freq: Two times a day (BID) | ORAL | Status: DC | PRN
  Administered 2022-02-09 – 2022-02-12 (×3): 0.5 mg via ORAL
  Filled 2022-02-09 (×3): qty 1

## 2022-02-09 MED ORDER — METOCLOPRAMIDE HCL 5 MG/ML IJ SOLN
10.0000 mg | INTRAMUSCULAR | Status: AC
  Administered 2022-02-09: 10 mg via INTRAVENOUS
  Filled 2022-02-09: qty 2

## 2022-02-09 MED ORDER — HYDROMORPHONE HCL 1 MG/ML IJ SOLN
0.5000 mg | INTRAMUSCULAR | Status: DC | PRN
  Administered 2022-02-09 – 2022-02-13 (×24): 1 mg via INTRAVENOUS
  Filled 2022-02-09 (×25): qty 1

## 2022-02-09 MED ORDER — DIATRIZOATE MEGLUMINE & SODIUM 66-10 % PO SOLN
90.0000 mL | Freq: Once | ORAL | Status: DC
  Filled 2022-02-09: qty 90

## 2022-02-09 MED ORDER — PROMETHAZINE HCL 25 MG RE SUPP
25.0000 mg | Freq: Three times a day (TID) | RECTAL | Status: DC | PRN
  Administered 2022-02-10: 25 mg via RECTAL
  Filled 2022-02-09 (×2): qty 1

## 2022-02-09 MED ORDER — HEPARIN SODIUM (PORCINE) 5000 UNIT/ML IJ SOLN
5000.0000 [IU] | Freq: Three times a day (TID) | INTRAMUSCULAR | Status: DC
  Administered 2022-02-09 – 2022-02-13 (×13): 5000 [IU] via SUBCUTANEOUS
  Filled 2022-02-09 (×13): qty 1

## 2022-02-09 MED ORDER — HYDRALAZINE HCL 20 MG/ML IJ SOLN: 10.0000 mg | Freq: Four times a day (QID) | INTRAMUSCULAR | Status: DC | PRN

## 2022-02-09 MED ORDER — LABETALOL HCL 5 MG/ML IV SOLN: 10.0000 mg | INTRAVENOUS | Status: DC | PRN

## 2022-02-09 MED ORDER — ISOSORBIDE MONONITRATE ER 60 MG PO TB24
60.0000 mg | ORAL_TABLET | Freq: Every day | ORAL | Status: DC
  Administered 2022-02-09 – 2022-02-13 (×4): 60 mg via ORAL
  Filled 2022-02-09 (×4): qty 1

## 2022-02-09 MED ORDER — PANTOPRAZOLE SODIUM 40 MG IV SOLR
40.0000 mg | INTRAVENOUS | Status: DC
  Administered 2022-02-09 – 2022-02-13 (×5): 40 mg via INTRAVENOUS
  Filled 2022-02-09 (×5): qty 10

## 2022-02-09 NOTE — Assessment & Plan Note (Signed)
Admit to med/surg patient. Inpatient status. NG tube placement. Prn IV dilaudid. EDP has consulted general surgery. Gentle IVF.

## 2022-02-09 NOTE — Assessment & Plan Note (Signed)
Chronic. Lives at [facility](independent living)

## 2022-02-09 NOTE — Assessment & Plan Note (Signed)
Chronic. 

## 2022-02-09 NOTE — ED Notes (Signed)
Patient transported to CT 

## 2022-02-09 NOTE — ED Provider Notes (Signed)
[facility] EMERGENCY DEPARTMENT Provider Note   CSN: [id number] Arrival date & time: 02/08/22  2352     History  Chief Complaint  Patient presents with   Hernia    Jesse [name] is a 71 y.o. male.  The history is provided by the patient and medical records.    71 year old male with history of CHF, chronic kidney disease, hypertension, anemia, hyperlipidemia, sleep apnea, diabetes, presenting to the ED with abdominal pain.  Patient states he began having abdominal pain around 5 PM, got much more severe around 9 PM.  He reports dry heaving but no true emesis.  He has not had any output from his ostomy in about 6 hours which is very unusual for him, usually changing this every few hours.  Does have history of bowel obstructions in the past.  Also feels like his hernia is increasingly more swollen.  He has not had any fever or chills.  No medication taken prior to arrival.  Does have complex abdominal surgical history-- near total colectomy (aside from 6 inches), prior abscesses with wound vacs, appendectomy.  Home Medications Prior to Admission medications   Medication Sig Start Date End Date Taking? Authorizing Provider  alfuzosin (UROXATRAL) 10 MG 24 hr tablet TAKE 1 TABLET BY MOUTH EVERY DAY 07/28/21   Lauree Chandler, NP  ALPRAZolam Duanne Moron) 0.25 MG tablet Take 2 tablets (0.5 mg total) by mouth 2 (two) times daily as needed for anxiety or sleep. 12/22/21   Ngetich, [name] C, NP  Blood Glucose Monitoring Suppl (ONE TOUCH ULTRA MINI) w/Device KIT Use to test blood sugar once daily. Dx:E11.22 04/12/21   Ngetich, [name] C, NP  calcium carbonate (TUMS - DOSED IN MG ELEMENTAL CALCIUM) 500 MG chewable tablet Chew 1 tablet by mouth daily.    [provider]  Carboxymethylcellulose Sodium (DRY EYE RELIEF OP) Place 1 drop into both eyes daily as needed (dry eyes).    [provider]  Cholecalciferol (VITAMIN D3) 50 MCG (2000 UT) capsule Take 0.5 capsules  (1,000 Units total) by mouth daily. 12/22/21   Ngetich, [name] C, NP  clopidogrel (PLAVIX) 75 MG tablet Take 1 tablet (75 mg total) by mouth daily. 11/03/21   Oswald Hillock, MD  cyanocobalamin 1000 MCG tablet Take 1,000 mcg by mouth daily.    [provider]  DULoxetine (CYMBALTA) 60 MG capsule Take 1 capsule (60 mg total) by mouth daily. 11/27/21   Ngetich, [name] C, NP  empagliflozin (JARDIANCE) 25 MG TABS tablet TAKE 1 TABLET BY MOUTH EVERY DAY 10/24/21   Ngetich, [name] C, NP  FEROSUL 325 (65 Fe) MG tablet TAKE 1 TABLET(325 MG) BY MOUTH DAILY WITH BREAKFAST 01/31/22   Ngetich, [name] C, NP  furosemide (LASIX) 40 MG tablet Take 80 mg by mouth in the morning and at bedtime.    [provider]  glucose blood test strip Use to test blood sugar once daily. Dx:E11.22 04/12/21   Ngetich, [name] C, NP  insulin detemir (LEVEMIR FLEXPEN) 100 UNIT/ML FlexPen Inject 42 Units into the skin at bedtime.    [provider]  Insulin Pen Needle 32G X 4 MM MISC Use to inject insulin twice daily. Dx: E11.22 09/11/21   Ngetich, Nelda Bucks, NP  isosorbide mononitrate (IMDUR) 60 MG 24 hr tablet Take 1 tablet (60 mg total) by mouth daily. 01/25/22   Ngetich, [name] C, NP  ketoconazole (NIZORAL) 2 % cream Apply 1 application topically as needed for irritation. 03/16/21   Ngetich,  Nelda Bucks, NP  Lancet Device MISC 1 Device by Does not apply route daily. 06/16/21   Ngetich, [name] C, NP  Lancets MISC 1 Device by Does not apply route as directed. Use as directed. 06/23/21   Ngetich, [name] C, NP  Loperamide HCl (IMODIUM PO) Take 6 mg by mouth 3 (three) times daily with meals.    [provider]  losartan (COZAAR) 25 MG tablet Take 25 mg by mouth daily.    [provider]  NYSTATIN powder Apply 1 application  topically daily as needed (Yeast infection to afffected area). 05/03/21   [provider]  OVER THE COUNTER MEDICATION Apply 1 Application topically every other day. Head and shoulder  medicated shampoo alternate with selsun blue medicated    [provider]  OXYGEN Inhale 2 L into the lungs continuous.    [provider]  pantoprazole (PROTONIX) 40 MG tablet Take 1 tablet (40 mg total) by mouth daily. 12/28/21   Ngetich, [name] C, NP  potassium chloride SA (KLOR-CON M) 20 MEQ tablet Take 20 mEq by mouth 2 (two) times daily.    [provider]  Red Yeast Rice 600 MG CAPS Take 1,200 mg by mouth daily.    [provider]  rosuvastatin (CRESTOR) 10 MG tablet TAKE 1 TABLET BY MOUTH EVERY DAY 11/27/21   Ngetich, [name] C, NP  simethicone (MYLICON) 80 MG chewable tablet Chew 80 mg by mouth every 6 (six) hours as needed for flatulence.    [provider]  topiramate (TOPAMAX) 25 MG tablet TAKE 1 TABLET BY MOUTH EVERY DAY 10/24/21   Ngetich, [name] C, NP  traMADol (ULTRAM) 50 MG tablet TAKE 1 TABLET(50 MG) BY MOUTH TWICE DAILY AS NEEDED 10/03/21   Ngetich, [name] C, NP  traZODone (DESYREL) 50 MG tablet TAKE 2 TABLETS(100 MG) BY MOUTH AT BEDTIME 01/05/22   Ngetich, [name] C, NP  triamcinolone cream (KENALOG) 0.1 % Apply 1 application. topically 2 (two) times daily. Apply to affected areas on shin area 06/30/21   Ngetich, [name] C, NP  TURMERIC PO Take 1,000 mg by mouth daily.    [provider]  VICTOZA 18 MG/3ML SOPN ADMINISTER 1.8 MG UNDER THE SKIN EVERY DAY 06/28/21   Ngetich, [name] C, NP      Allergies    Tape    Review of Systems   Review of Systems  Gastrointestinal:  Positive for abdominal distention, abdominal pain and nausea.  All other systems reviewed and are negative.   Physical Exam Updated Vital Signs BP (!) 148/74   Pulse 70   Temp 97.7 F (36.5 C) (Oral)   Resp (!) 22   Ht 6' (1.829 m)   Wt (!) 163.3 kg   SpO2 97%   BMI 48.82 kg/m  Physical Exam Vitals and nursing note reviewed.  Constitutional:      Appearance: He is well-developed.  HENT:     Head: Normocephalic and atraumatic.  Eyes:      Conjunctiva/sclera: Conjunctivae normal.     Pupils: Pupils are equal, round, and reactive to light.  Cardiovascular:     Rate and Rhythm: Normal rate and regular rhythm.     Heart sounds: Normal heart sounds.  Pulmonary:     Effort: Pulmonary effort is normal.     Breath sounds: Normal breath sounds.  Abdominal:     General: Bowel sounds are normal.     Palpations: Abdomen is soft.     Comments: Obese abdomen, well-healed midline abdominal  incision, 2 large left-sided hernias, locally tender without overlying skin changes, ostomy on right with no output in bag  Musculoskeletal:        General: Normal range of motion.     Cervical back: Normal range of motion.  Skin:    General: Skin is warm and dry.  Neurological:     Mental Status: He is alert and oriented to person, place, and time.     ED Results / Procedures / Treatments   Labs (all labs ordered are listed, but only abnormal results are displayed) Labs Reviewed  CBC WITH DIFFERENTIAL/PLATELET - Abnormal; Notable for the following components:      Result Value   RBC 3.70 (*)    Hemoglobin 10.5 (*)    HCT 33.6 (*)    Neutro Abs 8.5 (*)    All other components within normal limits  COMPREHENSIVE METABOLIC PANEL - Abnormal; Notable for the following components:   Potassium 3.4 (*)    Chloride 96 (*)    Glucose, Bld 270 (*)    BUN 34 (*)    Creatinine, Ser 2.22 (*)    GFR, Estimated 31 (*)    All other components within normal limits  LIPASE, BLOOD - Abnormal; Notable for the following components:   Lipase 55 (*)    All other components within normal limits  URINALYSIS, ROUTINE W REFLEX MICROSCOPIC    EKG None  Radiology CT ABDOMEN PELVIS W CONTRAST  Result Date: 02/09/2022 CLINICAL DATA:  Bowel obstruction suspected, history of multiple surgeries. No osteotomy output in 6 hours. Abdominal distention. EXAM: CT ABDOMEN AND PELVIS WITH CONTRAST TECHNIQUE: Multidetector CT imaging of the abdomen and pelvis was  performed using the standard protocol following bolus administration of intravenous contrast. RADIATION DOSE REDUCTION: This exam was performed according to the departmental dose-optimization program which includes automated exposure control, adjustment of the mA and/or kV according to patient size and/or use of iterative reconstruction technique. CONTRAST:  83m OMNIPAQUE IOHEXOL 350 MG/ML SOLN COMPARISON:  03/06/2015. FINDINGS: Lower chest: The heart is enlarged and there is a small pericardial effusion. Pacemaker leads are noted in the heart. With rounded opacities at the left lung base. Right lung base is clear. Hepatobiliary: The liver has a nodular contour suggesting underlying cirrhosis. The gallbladder is without stones. No biliary ductal dilatation. Pancreas: Fatty atrophy. No pancreatic ductal dilatation or surrounding inflammatory changes. Spleen: Normal in size without focal abnormality. Adrenals/Urinary Tract: No adrenal nodule or mass. There is bilateral renal atrophy with renal cortical thinning hearing. A isoechoic exophytic lesion is present along the posterior aspect of the left kidney measuring 2.5 cm. No renal calculus or hydronephrosis. The bladder is unremarkable. Stomach/Bowel: The distal esophagus is mildly distended with fluid. There is a large complex ventral abdominal wall hernia containing small bowel, stomach, and a portion of the colon in the midline. The hernia pouch is are not well evaluated and incompletely imaged due to field of view and patient's body habitus. There is dilatation of the proximal small bowel with air-fluid levels measuring up to 4.7 cm to the level of a spigelian hernia on the left. The visualized loops of small bowel in the spigelian hernia pouch are distended measuring 4 cm, however only partially included in the field of view. The remaining loops of small bowel in the abdomen and hernia pouch are decompressed. No free air or pneumatosis. An ostomy is noted in the  right lower quadrant. Vascular/Lymphatic: Aortic atherosclerosis. No enlarged abdominal or pelvic lymph nodes.  Reproductive: Prostate is unremarkable. Other: No abdominopelvic ascites. Musculoskeletal: Degenerative changes in the thoracolumbar spine. No acute osseous abnormality. IMPRESSION: 1. Distention of the stomach and proximal small bowel measuring up to 4.7 cm to the level of the left spigelian hernia. The small bowel in the spigelian hernia pouch measures up to 4 cm. The remaining distal small-bowel is decompressed. Findings are concerning for small bowel obstruction, however the hernia pouch is only partially visualized on exam due to patient's body habitus. Surgical consultation is recommended. 2. Large ventral abdominal wall hernia containing a portion of the stomach, small bowel, and colon, only partially visualized on exam. 3. Nodular contour of the liver, suggesting underlying cirrhosis. 4. Exophytic isodense lesion in the posterior aspect of the left kidney which is new from the previous exam. Ultrasound is recommended for further characterization. 5. Small left pleural effusion with rounded opacities at the left lung base, possible atelectasis or pneumonia. The possibility of underlying malignancy can not be completely excluded. 6. Aortic atherosclerosis. 7. Remaining findings as described above. Electronically Signed   By: Brett Fairy M.D.   On: 02/09/2022 03:26    Procedures Procedures    Medications Ordered in ED Medications  metoCLOPramide (REGLAN) injection 10 mg (has no administration in time range)  HYDROmorphone (DILAUDID) injection 1 mg (has no administration in time range)  HYDROmorphone (DILAUDID) injection 1 mg (1 mg Intravenous Given 02/09/22 0045)  ondansetron (ZOFRAN) injection 4 mg (4 mg Intravenous Given 02/09/22 0044)  ondansetron (ZOFRAN) injection 4 mg (4 mg Intravenous Given 02/09/22 0228)  iohexol (OMNIPAQUE) 350 MG/ML injection 60 mL (60 mLs Intravenous Contrast  Given 02/09/22 0256)    ED Course/ Medical Decision Making/ A&P                           Medical Decision Making Amount and/or Complexity of Data Reviewed Labs: ordered. Radiology: ordered and independent interpretation performed. ECG/medicine tests: ordered and independent interpretation performed.  Risk Prescription drug management. Decision regarding hospitalization.    71 year old male presenting to ED for abdominal pain and dry heaves.  Has had increased pain to left-sided hernia.  Also has ostomy, no output in the past 6+ hours which is atypical for him.  Does have history of SBO in the past.  He is afebrile, nontoxic.  Does have well-healed surgical incision of the abdomen and 2 large left-sided hernias.  Left upper hernia does seem tender to palpation but there is no skin discoloration or erythema.  Ostomy in place, no output noted in collection bag.  He is high risk for recurrent SBO.  Will obtain labs and plan for CT scan.  He was given pain and nausea medication.  Labs overall reassuring.  He has no leukocytosis or significant electrolyte derangement.  Serum creatinine is 2.22 which appears baseline compared with prior from October 2023.  Lipase 55.  CT scan concerning for small bowel obstruction and left sided spigelian hernia, however incompletely visualized due to habitus.  Will discuss with general surgery.  3:42 AM Discussed with on call general surgery, Dr. Rosendo Gros-- NG for now, he will see in consult.  Admit to medicine service.  Discussed with hospitalist, Dr. Bridgett Larsson-- will admit for ongoing care.  Final Clinical Impression(s) / ED Diagnoses Final diagnoses:  SBO (small bowel obstruction) [facility])    Rx / DC Orders ED Discharge Orders     None         Larene Pickett, PA-C 02/09/22 [id number]  Delora Fuel, MD 40/98/11 [phone]

## 2022-02-09 NOTE — Subjective & Objective (Addendum)
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  He states that he normally changes his ostomy bag several times a day.  He noticed some slight abdominal pain but no ostomy output.  Came to the ER for evaluation.  CT scan demonstrated small bowel obstruction at the level of the left spigelian hernia.  Labs are stable for him.  EDP has consulted general surgery.  They recommended NG tube replacement.  And admission to the hospitalist service.  [facility] hospitalist contacted for admission.

## 2022-02-09 NOTE — Progress Notes (Signed)
New NG successfully placed with 2 l out within the first hour. Patient much more comfortable now. No tenderness. No colostomy output.

## 2022-02-09 NOTE — Consult Note (Signed)
[facility] Surgery Consult Note  Jesse [name] 01/14/1951  [id number].    Requesting MD: Karma Greaser Chief Complaint/Reason for Consult: SBO  HPI:  Jesse [name] is a 71 y.o. 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 Faythe Ghee in [facility], he was left open and went back for washout and closure the following day. States that he has never had an SBO before. His symptoms started yesterday around 1700 while he was working. Pain was mostly centered around his central ventral hernia. Pain progressively worsened and was associated with nausea and dry heaving, as well as decreased colostomy output. States that his left sided ventral hernia became more firm, but pain was not as severe here. In the ED patient underwent CT scan which reports distention of the stomach and proximal small bowel measuring up to 4.7 cm to the level of the left spigelian hernia; the small bowel in the spigelian hernia pouch measures up to 4 cm; the remaining distal small-bowel is decompressed; findings are concerning for small bowel obstruction, however the hernia pouch is only partially visualized on exam due to patient's body habitus; surgical consultation is recommended; large ventral abdominal wall hernia containing a portion of the stomach, small bowel, and colon, only partially visualized on exam. NG tube was placed. Patient was admitted to the medical service. General surgery asked to see.   Lives at [facility]  Uses electric  wheelchair mostly, but can ambulate short distances   Family History  Problem Relation Age of Onset   Diabetes Brother    Bipolar disorder Daughter    Anxiety disorder Son     Past Medical History:  Diagnosis Date   Alcohol use disorder in remission    Anxiety and depression    At high risk for falls    Former cigarette smoker    Generalized anxiety disorder    Hard of hearing    Hyperlipidemia due to type 2 diabetes mellitus ([place])    Hypertension    Morbid obesity ([place])    Neuropathy    O2 dependent    Risk for falls    Sleep apnea    Type 2 diabetes mellitus ([place])     Past Surgical History:  Procedure Laterality Date   APPENDECTOMY  1980   IR THORACENTESIS ASP PLEURAL SPACE W/IMG GUIDE  05/18/2021   IR THORACENTESIS ASP PLEURAL SPACE W/IMG GUIDE  10/06/2021   KNEE SURGERY Right    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   Procedure: PACEMAKER IMPLANT;  Surgeon: Deboraha Sprang, MD;  Location: [facility];  Service: Cardiovascular;  Laterality: N/A;    Social History:  reports that he quit smoking about 24 years ago. His smoking use included cigarettes. He smoked an average of 1 pack per day. He has never used smokeless tobacco. He reports that he does not currently use alcohol. He reports that he does not use drugs.  Allergies:  Allergies  Allergen Reactions   Tape Rash    If left on for a long time    Medications Prior to Admission  Medication Sig Dispense Refill   alfuzosin (UROXATRAL) 10 MG 24 hr tablet TAKE 1 TABLET BY MOUTH EVERY DAY (Patient taking differently: Take 10 mg by mouth daily with breakfast.) 30 tablet 0   ALPRAZolam (XANAX) 0.25 MG tablet Take 2 tablets (0.5 mg total) by mouth 2 (two) times daily as needed for anxiety or sleep. 60 tablet 0   calcium carbonate (TUMS - DOSED IN MG ELEMENTAL CALCIUM) 500 MG chewable tablet Chew 1 tablet by mouth 2 (two) times daily.     Carboxymethylcellulose Sodium (DRY EYE RELIEF OP) Place 1 drop into both eyes  daily as needed (dry eyes).     Cholecalciferol (VITAMIN D3) 50 MCG (2000 UT) capsule Take 0.5 capsules (1,000 Units total) by mouth daily. 30 capsule 3   clopidogrel (PLAVIX) 75 MG tablet Take 1 tablet (75 mg total) by mouth daily. 30 tablet 3   cyanocobalamin 1000 MCG tablet Take 1,000 mcg by mouth daily.     DULoxetine (CYMBALTA) 60 MG capsule Take 1 capsule (60 mg total) by mouth daily. 90 capsule 1   empagliflozin (JARDIANCE) 25 MG TABS tablet TAKE 1 TABLET BY MOUTH EVERY DAY (Patient taking differently: Take 25 mg by mouth daily.) 90 tablet 2   FEROSUL 325 (65 Fe) MG tablet TAKE 1 TABLET(325 MG) BY MOUTH DAILY WITH BREAKFAST (Patient taking differently: Take 325 mg by mouth daily with breakfast.) 30 tablet 5   furosemide (LASIX) 40 MG tablet Take 80 mg by mouth in the morning and at bedtime.     insulin detemir (LEVEMIR FLEXPEN) 100 UNIT/ML FlexPen Inject 42 Units into the skin at bedtime.     isosorbide mononitrate (IMDUR) 60 MG 24 hr tablet Take 1 tablet (60 mg total) by mouth daily. 90 tablet 1   ketoconazole (NIZORAL) 2 % cream Apply 1 application topically as needed for irritation. 15 g 5   Loperamide HCl (IMODIUM PO) Take 6 mg by mouth 3 (three) times daily with meals.     losartan (COZAAR) 25 MG tablet Take 25 mg by mouth daily.     NYSTATIN powder Apply 1 application  topically daily as needed (Yeast infection to afffected area).     OVER THE COUNTER MEDICATION Apply 1 Application topically every other day. Head and shoulder medicated shampoo alternate with selsun blue medicated     pantoprazole (PROTONIX) 40 MG tablet Take 1 tablet (40 mg total) by mouth daily. 30 tablet 3   potassium chloride SA (KLOR-CON M) 20 MEQ tablet Take 20 mEq by mouth 2 (two) times daily.     Red Yeast Rice 600 MG CAPS Take 1,200 mg by mouth daily.     rosuvastatin (CRESTOR) 10 MG tablet TAKE 1 TABLET BY MOUTH EVERY DAY 90 tablet 1   simethicone (MYLICON) 80 MG chewable tablet Chew 80 mg by mouth every 6  (six) hours as needed for flatulence.     topiramate (TOPAMAX) 25 MG tablet TAKE 1 TABLET BY MOUTH EVERY DAY (Patient taking differently: Take 25 mg by mouth daily.) 90 tablet 2   traMADol (ULTRAM) 50 MG tablet TAKE 1 TABLET(50 MG) BY MOUTH TWICE DAILY AS NEEDED (Patient taking differently: Take 50 mg by mouth 2 (two) times daily as needed for moderate pain.) 60 tablet 3   traZODone (DESYREL) 50 MG tablet TAKE 2 TABLETS(100 MG) BY MOUTH AT BEDTIME 60 tablet 3   triamcinolone cream (KENALOG) 0.1 % Apply 1 application. topically 2 (two) times daily. Apply to affected areas on shin area (Patient  taking differently: Apply 1 application  topically 2 (two) times daily as needed. Apply to affected areas on shin area) 45 g 0   TURMERIC PO Take 1,000 mg by mouth daily.     VICTOZA 18 MG/3ML SOPN ADMINISTER 1.8 MG UNDER THE SKIN EVERY DAY (Patient taking differently: Inject 1.8 mg into the skin daily.) 27 mL 5   glucose blood test strip Use to test blood sugar once daily. Dx:E11.22 100 each 3   Insulin Pen Needle 32G X 4 MM MISC Use to inject insulin twice daily. Dx: E11.22 200 each 3   Lancet Device MISC 1 Device by Does not apply route daily. 1 each 11   Lancets MISC 1 Device by Does not apply route as directed. Use as directed. 100 each 2   OXYGEN Inhale 2 L into the lungs continuous.      Prior to Admission medications   Medication Sig Start Date End Date Taking? Authorizing Provider  alfuzosin (UROXATRAL) 10 MG 24 hr tablet TAKE 1 TABLET BY MOUTH EVERY DAY Patient taking differently: Take 10 mg by mouth daily with breakfast. 07/28/21  Yes Lauree Chandler, NP  ALPRAZolam Duanne Moron) 0.25 MG tablet Take 2 tablets (0.5 mg total) by mouth 2 (two) times daily as needed for anxiety or sleep. 12/22/21  Yes Ngetich, [name] C, NP  calcium carbonate (TUMS - DOSED IN MG ELEMENTAL CALCIUM) 500 MG chewable tablet Chew 1 tablet by mouth 2 (two) times daily.   Yes [provider]  Carboxymethylcellulose Sodium  (DRY EYE RELIEF OP) Place 1 drop into both eyes daily as needed (dry eyes).   Yes [provider]  Cholecalciferol (VITAMIN D3) 50 MCG (2000 UT) capsule Take 0.5 capsules (1,000 Units total) by mouth daily. 12/22/21  Yes Ngetich, [name] C, NP  clopidogrel (PLAVIX) 75 MG tablet Take 1 tablet (75 mg total) by mouth daily. 11/03/21  Yes Oswald Hillock, MD  cyanocobalamin 1000 MCG tablet Take 1,000 mcg by mouth daily.   Yes [provider]  DULoxetine (CYMBALTA) 60 MG capsule Take 1 capsule (60 mg total) by mouth daily. 11/27/21  Yes Ngetich, [name] C, NP  empagliflozin (JARDIANCE) 25 MG TABS tablet TAKE 1 TABLET BY MOUTH EVERY DAY Patient taking differently: Take 25 mg by mouth daily. 10/24/21  Yes Ngetich, [name] C, NP  FEROSUL 325 (65 Fe) MG tablet TAKE 1 TABLET(325 MG) BY MOUTH DAILY WITH BREAKFAST Patient taking differently: Take 325 mg by mouth daily with breakfast. 01/31/22  Yes Ngetich, [name] C, NP  furosemide (LASIX) 40 MG tablet Take 80 mg by mouth in the morning and at bedtime.   Yes [provider]  insulin detemir (LEVEMIR FLEXPEN) 100 UNIT/ML FlexPen Inject 42 Units into the skin at bedtime.   Yes [provider]  isosorbide mononitrate (IMDUR) 60 MG 24 hr tablet Take 1 tablet (60 mg total) by mouth daily. 01/25/22  Yes Ngetich, [name] C, NP  ketoconazole (NIZORAL) 2 % cream Apply 1 application topically as needed for irritation. 03/16/21  Yes Ngetich, [name] C, NP  Loperamide HCl (IMODIUM PO) Take 6 mg by mouth 3 (three) times daily with meals.   Yes [provider]  losartan (COZAAR) 25 MG tablet Take 25 mg by mouth daily.   Yes [provider]  NYSTATIN powder Apply 1 application  topically daily as needed (Yeast infection to afffected area). 05/03/21  Yes [provider]  OVER THE COUNTER MEDICATION Apply 1 Application topically every other day. Head and  shoulder medicated shampoo alternate with selsun blue medicated   Yes [provider]  pantoprazole (PROTONIX) 40 MG tablet Take 1 tablet (40 mg total) by mouth daily. 12/28/21  Yes Ngetich, [name] C, NP  potassium chloride SA (KLOR-CON M) 20 MEQ tablet Take 20 mEq by mouth 2 (two) times daily.   Yes [provider]  Red Yeast Rice 600 MG CAPS Take 1,200 mg by mouth daily.   Yes [provider]  rosuvastatin (CRESTOR) 10 MG tablet TAKE 1 TABLET BY MOUTH EVERY DAY 11/27/21  Yes Ngetich, [name] C, NP  simethicone (MYLICON) 80 MG chewable tablet Chew 80 mg by mouth every 6 (six) hours as needed for flatulence.   Yes [provider]  topiramate (TOPAMAX) 25 MG tablet TAKE 1 TABLET BY MOUTH EVERY DAY Patient taking differently: Take 25 mg by mouth daily. 10/24/21  Yes Ngetich, [name] C, NP  traMADol (ULTRAM) 50 MG tablet TAKE 1 TABLET(50 MG) BY MOUTH TWICE DAILY AS NEEDED Patient taking differently: Take 50 mg by mouth 2 (two) times daily as needed for moderate pain. 10/03/21  Yes Ngetich, [name] C, NP  traZODone (DESYREL) 50 MG tablet TAKE 2 TABLETS(100 MG) BY MOUTH AT BEDTIME 01/05/22  Yes Ngetich, [name] C, NP  triamcinolone cream (KENALOG) 0.1 % Apply 1 application. topically 2 (two) times daily. Apply to affected areas on shin area Patient taking differently: Apply 1 application  topically 2 (two) times daily as needed. Apply to affected areas on shin area 06/30/21  Yes Ngetich, [name] C, NP  TURMERIC PO Take 1,000 mg by mouth daily.   Yes [provider]  VICTOZA 18 MG/3ML SOPN ADMINISTER 1.8 MG UNDER THE SKIN EVERY DAY Patient taking differently: Inject 1.8 mg into the skin daily. 06/28/21  Yes Ngetich, [name] C, NP  glucose blood test strip Use to test blood sugar once daily. Dx:E11.22 04/12/21   Ngetich, [name] C, NP  Insulin Pen Needle 32G X 4 MM MISC Use to inject insulin twice daily. Dx: E11.22 09/11/21   Ngetich, Nelda Bucks, NP  Lancet Device MISC 1 Device by Does not apply route daily. 06/16/21   Ngetich, [name] C, NP  Lancets MISC 1 Device by  Does not apply route as directed. Use as directed. 06/23/21   Ngetich, [name] C, NP  OXYGEN Inhale 2 L into the lungs continuous.    [provider]    Blood pressure (!) 115/52, pulse 81, temperature 98.5 F (36.9 C), resp. rate 18, height 6' (1.829 m), weight (!) 163.3 kg, SpO2 98 %. Physical Exam: General: chronically ill appearing male who is laying in bed in NAD Heart: regular, rate, and rhythm Lungs: Respiratory effort nonlabored on room air Abd: obese, soft, nontender, large ventral hernia in the epigastric region is soft and intestine seems to slide freely but is not fully reducible/ no overlying skin changes or tenderness; left sided abdominal hernia is firm but nontender and not reducible/ no overlying skin changes MS: BLE edema present Skin: warm and dry with no masses, lesions, or rashes Psych: A&Ox4 with an appropriate affect  Results for orders placed or performed during the hospital encounter of 02/08/22 (from the past 48 hour(s))  CBC with Differential     Status: Abnormal   Collection Time: 02/09/22 12:39 AM  Result Value Ref Range   WBC 10.5 4.0 - 10.5 K/uL   RBC 3.70 (L) 4.22 - 5.81 MIL/uL   Hemoglobin 10.5 (L) 13.0 - 17.0 g/dL   HCT 33.6 (  L) 39.0 - 52.0 %   MCV 90.8 80.0 - 100.0 fL   MCH 28.4 26.0 - 34.0 pg   MCHC 31.3 30.0 - 36.0 g/dL   RDW 15.3 11.5 - 15.5 %   Platelets 245 150 - 400 K/uL   nRBC 0.0 0.0 - 0.2 %   Neutrophils Relative % 81 %   Neutro Abs 8.5 (H) 1.7 - 7.7 K/uL   Lymphocytes Relative 8 %   Lymphs Abs 0.8 0.7 - 4.0 K/uL   Monocytes Relative 9 %   Monocytes Absolute 1.0 0.1 - 1.0 K/uL   Eosinophils Relative 1 %   Eosinophils Absolute 0.1 0.0 - 0.5 K/uL   Basophils Relative 0 %   Basophils Absolute 0.0 0.0 - 0.1 K/uL   Immature Granulocytes 1 %   Abs Immature Granulocytes 0.05 0.00 - 0.07 K/uL    Comment: Performed at [facility] [address] [place], [place] [address]  Comprehensive metabolic panel     Status: Abnormal    Collection Time: 02/09/22 12:39 AM  Result Value Ref Range   Sodium 135 135 - 145 mmol/L   Potassium 3.4 (L) 3.5 - 5.1 mmol/L   Chloride 96 (L) 98 - 111 mmol/L   CO2 26 22 - 32 mmol/L   Glucose, Bld 270 (H) 70 - 99 mg/dL    Comment: Glucose reference range applies only to samples taken after fasting for at least 8 hours.   BUN 34 (H) 8 - 23 mg/dL   Creatinine, Ser 2.22 (H) 0.61 - 1.24 mg/dL   Calcium 9.1 8.9 - 10.3 mg/dL   Total Protein 7.2 6.5 - 8.1 g/dL   Albumin 3.5 3.5 - 5.0 g/dL   AST 20 15 - 41 U/L   ALT 15 0 - 44 U/L   Alkaline Phosphatase 59 38 - 126 U/L   Total Bilirubin 0.7 0.3 - 1.2 mg/dL   GFR, Estimated 31 (L) >60 mL/min    Comment: (NOTE) Calculated using the CKD-EPI Creatinine Equation (2021)    Anion gap 13 5 - 15    Comment: Performed at [facility] [address], [place], [place] [address]  Lipase, blood     Status: Abnormal   Collection Time: 02/09/22 12:39 AM  Result Value Ref Range   Lipase 55 (H) 11 - 51 U/L    Comment: Performed at [facility], [place] [address], [place], [place] [address]  Comprehensive metabolic panel     Status: Abnormal   Collection Time: 02/09/22  4:52 AM  Result Value Ref Range   Sodium 134 (L) 135 - 145 mmol/L   Potassium 3.5 3.5 - 5.1 mmol/L   Chloride 94 (L) 98 - 111 mmol/L   CO2 25 22 - 32 mmol/L   Glucose, Bld 316 (H) 70 - 99 mg/dL    Comment: Glucose reference range applies only to samples taken after fasting for at least 8 hours.   BUN 37 (H) 8 - 23 mg/dL   Creatinine, Ser 2.34 (H) 0.61 - 1.24 mg/dL   Calcium 9.3 8.9 - 10.3 mg/dL   Total Protein 7.5 6.5 - 8.1 g/dL   Albumin 3.5 3.5 - 5.0 g/dL   AST 20 15 - 41 U/L   ALT 14 0 - 44 U/L   Alkaline Phosphatase 65 38 - 126 U/L   Total Bilirubin 0.8 0.3 - 1.2 mg/dL   GFR, Estimated 29 (L) >60 mL/min    Comment: (NOTE) Calculated using the  CKD-EPI Creatinine Equation (2021)    Anion gap 15 5 - 15    Comment: Performed at [facility], [place] [address] [place], [place] [address]  CBC with Differential/Platelet     Status: Abnormal   Collection Time: 02/09/22  4:52 AM  Result Value Ref Range   WBC 11.9 (H) 4.0 - 10.5 K/uL   RBC 3.82 (L) 4.22 - 5.81 MIL/uL   Hemoglobin 11.0 (L) 13.0 - 17.0 g/dL   HCT 34.4 (L) 39.0 - 52.0 %   MCV 90.1 80.0 - 100.0 fL   MCH 28.8 26.0 - 34.0 pg   MCHC 32.0 30.0 - 36.0 g/dL   RDW 15.5 11.5 - 15.5 %   Platelets 249 150 - 400 K/uL   nRBC 0.0 0.0 - 0.2 %   Neutrophils Relative % 91 %   Neutro Abs 10.7 (H) 1.7 - 7.7 K/uL   Lymphocytes Relative 3 %   Lymphs Abs 0.4 (L) 0.7 - 4.0 K/uL   Monocytes Relative 6 %   Monocytes Absolute 0.7 0.1 - 1.0 K/uL   Eosinophils Relative 0 %   Eosinophils Absolute 0.0 0.0 - 0.5 K/uL   Basophils Relative 0 %   Basophils Absolute 0.0 0.0 - 0.1 K/uL   Immature Granulocytes 0 %   Abs Immature Granulocytes 0.05 0.00 - 0.07 K/uL    Comment: Performed at [facility], [place] [address], [place], [place] [address]  Magnesium     Status: Abnormal   Collection Time: 02/09/22  4:52 AM  Result Value Ref Range   Magnesium 2.7 (H) 1.7 - 2.4 mg/dL    Comment: Performed at [facility] [address], [place], [place] [address]  CBG monitoring, ED     Status: Abnormal   Collection Time: 02/09/22  6:07 AM  Result Value Ref Range   Glucose-Capillary 323 (H) 70 - 99 mg/dL    Comment: Glucose reference range applies only to samples taken after fasting for at least 8 hours.  Glucose, capillary     Status: Abnormal   Collection Time: 02/09/22  7:47 AM  Result Value Ref Range   Glucose-Capillary 318 (H) 70 - 99 mg/dL    Comment: Glucose reference range applies only to samples taken after fasting for at least 8 hours.   CT ABDOMEN PELVIS W CONTRAST  Result Date: 02/09/2022 CLINICAL DATA:  Bowel obstruction suspected, history of multiple surgeries. No osteotomy output in 6 hours. Abdominal distention. EXAM: CT ABDOMEN AND PELVIS WITH CONTRAST TECHNIQUE: Multidetector CT imaging  of the abdomen and pelvis was performed using the standard protocol following bolus administration of intravenous contrast. RADIATION DOSE REDUCTION: This exam was performed according to the departmental dose-optimization program which includes automated exposure control, adjustment of the mA and/or kV according to patient size and/or use of iterative reconstruction technique. CONTRAST:  34mL OMNIPAQUE IOHEXOL 350 MG/ML SOLN COMPARISON:  03/06/2015. FINDINGS: Lower chest: The heart is enlarged and there is a small pericardial effusion. Pacemaker leads are noted in the heart. With rounded opacities at the left lung base. Right lung base is clear. Hepatobiliary: The liver has a nodular contour suggesting underlying cirrhosis. The gallbladder is without stones. No biliary ductal dilatation. Pancreas: Fatty atrophy. No pancreatic ductal dilatation or surrounding inflammatory changes. Spleen: Normal in size without focal abnormality. Adrenals/Urinary Tract: No adrenal nodule or mass. There is bilateral renal atrophy with renal cortical thinning hearing. A isoechoic exophytic lesion is present along the posterior aspect of the  left kidney measuring 2.5 cm. No renal calculus or hydronephrosis. The bladder is unremarkable. Stomach/Bowel: The distal esophagus is mildly distended with fluid. There is a large complex ventral abdominal wall hernia containing small bowel, stomach, and a portion of the colon in the midline. The hernia pouch is are not well evaluated and incompletely imaged due to field of view and patient's body habitus. There is dilatation of the proximal small bowel with air-fluid levels measuring up to 4.7 cm to the level of a spigelian hernia on the left. The visualized loops of small bowel in the spigelian hernia pouch are distended measuring 4 cm, however only partially included in the field of view. The remaining loops of small bowel in the abdomen and hernia pouch are decompressed. No free air or  pneumatosis. An ostomy is noted in the right lower quadrant. Vascular/Lymphatic: Aortic atherosclerosis. No enlarged abdominal or pelvic lymph nodes. Reproductive: Prostate is unremarkable. Other: No abdominopelvic ascites. Musculoskeletal: Degenerative changes in the thoracolumbar spine. No acute osseous abnormality. IMPRESSION: 1. Distention of the stomach and proximal small bowel measuring up to 4.7 cm to the level of the left spigelian hernia. The small bowel in the spigelian hernia pouch measures up to 4 cm. The remaining distal small-bowel is decompressed. Findings are concerning for small bowel obstruction, however the hernia pouch is only partially visualized on exam due to patient's body habitus. Surgical consultation is recommended. 2. Large ventral abdominal wall hernia containing a portion of the stomach, small bowel, and colon, only partially visualized on exam. 3. Nodular contour of the liver, suggesting underlying cirrhosis. 4. Exophytic isodense lesion in the posterior aspect of the left kidney which is new from the previous exam. Ultrasound is recommended for further characterization. 5. Small left pleural effusion with rounded opacities at the left lung base, possible atelectasis or pneumonia. The possibility of underlying malignancy can not be completely excluded. 6. Aortic atherosclerosis. 7. Remaining findings as described above. Electronically Signed   By: Brett Fairy M.D.   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 Faythe Ghee in [facility], he was left open and went back for washout and closure the following day - No indication for acute surgical intervention. Will ensure NG tube is in correct position as it looks like it has backed out, then after a period of decompression start  SBO protocol.   ID - none VTE - sq heparin FEN - IVF, NPO/NGT to LIWS Foley - none  Recent TIA vs CVA 10/2021 on plavix (last dose 12/21 in AM) DM HLD OSA Chronic respiratory failure on home O2 Morbid obesity BMI 48.82 SSS s/p PPM CHF CAD with h/o cardiac stents HTN CKD-IV  I reviewed ED provider notes, hospitalist notes, last 24 h vitals and pain scores, last 48 h intake and output, last 24 h labs and trends, and last 24 h imaging results   Wellington Hampshire, [facility] Surgery 02/09/2022, 8:58 AM Please see Amion for pager number during day hours 7:00am-4:30pm

## 2022-02-09 NOTE — Evaluation (Signed)
Physical Therapy Evaluation Patient Details Name: Jesse [name] MRN: [id number] DOB: Feb 11, 1951 Today's Date: 02/09/2022  History of Present Illness  71 y/o male admitted 12/22 with SBO. PMhx: typically WC bound but can walk short distances, morbid obesity, CKD III, chronic hypoxic resp failure on 2L O2, HTN, colectomy and colostomy, neuropathy, R knee surgery, PPM, dCHF, ventral hernia  Clinical Impression  PT pleasant and reports at least 18 falls in the last several years so he no longer attempts walking but tranfers to power chair and performs standing trials with HHPT. Pt lives in [place] with assist for cooking and cleaning and has HHPT at baseline. Pt at or very near baseline with continued HHPT recommended. Pt encouraged to be OOB daily acutely and will benefit from acute therapy to maximize strength and function.        Recommendations for follow up therapy are one component of a multi-disciplinary discharge planning process, led by the attending physician.  Recommendations may be updated based on patient status, additional functional criteria and insurance authorization.  Follow Up Recommendations Home health PT      Assistance Recommended at Discharge PRN  Patient can return home with the following  Assistance with cooking/housework;Assist for transportation    Equipment Recommendations None recommended by PT  Recommendations for Other Services       Functional Status Assessment Patient has had a recent decline in their functional status and/or demonstrates limited ability to make significant improvements in function in a reasonable and predictable amount of time     Precautions / Restrictions Precautions Precautions: Fall;Other (comment) Precaution Comments: NGT, O2      Mobility  Bed Mobility Overal bed mobility: Modified Independent             General bed mobility comments: pt able to transition supine to and from sit without physical assist. supine to sit x  2 with min HHA on intial rise, repeat trials without assist    Transfers Overall transfer level: Needs assistance   Transfers: Sit to/from Stand, Bed to chair/wheelchair/BSC Sit to Stand: Supervision Stand pivot transfers: Supervision         General transfer comment: setup for equipment with pt able to verbalize sequence for home setup. Pt able to stand and pivot with bil UE on armrests of chair with supervision for lines    Ambulation/Gait               General Gait Details: unable at baseline  Stairs            Wheelchair Mobility    Modified Rankin (Stroke Patients Only)       Balance Overall balance assessment: Needs assistance Sitting-balance support: No upper extremity supported, Feet supported Sitting balance-Leahy Scale: Good     Standing balance support: Reliant on assistive device for balance Standing balance-Leahy Scale: Poor                               Pertinent Vitals/Pain Pain Assessment Pain Assessment: No/denies pain    Home Living Family/patient expects to be discharged to:: Private residence Living Arrangements: Alone   Type of Home: Independent living facility Home Access: Level entry       Home Layout: One level Home Equipment: Tub bench;Grab bars - tub/shower;Grab bars - toilet;Hand held shower head;Rollator (4 wheels);Wheelchair - power Additional Comments: daughter is an OT    Prior Function Prior Level of Function : Independent/Modified Independent  Mobility Comments: power chair, stands with therapy, no longer walks ADLs Comments: independent, staff performs cooking and cleaning     Hand Dominance        Extremity/Trunk Assessment   Upper Extremity Assessment Upper Extremity Assessment: Generalized weakness    Lower Extremity Assessment Lower Extremity Assessment: Generalized weakness    Cervical / Trunk Assessment Cervical / Trunk Assessment: Kyphotic;Other  exceptions Cervical / Trunk Exceptions: increased body habitus, large hernia  Communication   Communication: No difficulties  Cognition Arousal/Alertness: Awake/alert Behavior During Therapy: WFL for tasks assessed/performed Overall Cognitive Status: Within Functional Limits for tasks assessed                                          General Comments      Exercises     Assessment/Plan    PT Assessment Patient needs continued PT services  PT Problem List Decreased strength;Decreased mobility;Obesity;Decreased activity tolerance       PT Treatment Interventions Therapeutic exercise;Patient/family education;Functional mobility training;Therapeutic activities    PT Goals (Current goals can be found in the Care Plan section)  Acute Rehab PT Goals Patient Stated Goal: return to apartment PT Goal Formulation: With patient/family Time For Goal Achievement: 02/16/22 Potential to Achieve Goals: Fair    Frequency Min 2X/week     Co-evaluation               AM-PAC PT "6 Clicks" Mobility  Outcome Measure Help needed turning from your back to your side while in a flat bed without using bedrails?: None Help needed moving from lying on your back to sitting on the side of a flat bed without using bedrails?: None Help needed moving to and from a bed to a chair (including a wheelchair)?: A Little Help needed standing up from a chair using your arms (e.g., wheelchair or bedside chair)?: A Little Help needed to walk in hospital room?: Total Help needed climbing 3-5 steps with a railing? : Total 6 Click Score: 16    End of Session Equipment Utilized During Treatment: Oxygen Activity Tolerance: Patient tolerated treatment well Patient left: in chair;with call bell/phone within reach;with chair alarm set;with family/visitor present Nurse Communication: Mobility status PT Visit Diagnosis: Other abnormalities of gait and mobility (R26.89);Muscle weakness  (generalized) (M62.81)    Time: 5038-8828 PT Time Calculation (min) (ACUTE ONLY): 25 min   Charges:   PT Evaluation $PT Eval Moderate Complexity: 1 Mod          Jaimes Eckert P, PT Acute Rehabilitation Services Office: [phone]   Lamarr Lulas 02/09/2022, 9:52 AM

## 2022-02-09 NOTE — Assessment & Plan Note (Signed)
Pt will be NPO. Add clonidine patch. Prn IV labetalol

## 2022-02-09 NOTE — Progress Notes (Addendum)
PROGRESS NOTE                                                                                                                                                                                                             Patient Demographics:    Jesse [name], is a 71 y.o. male, DOB - [birth year], OAC:[id number]  Outpatient Primary MD for the patient is Ngetich, Nelda Bucks, NP    LOS - 0  Admit date - 02/08/2022    Chief Complaint  Patient presents with   Hernia       Brief Narrative (HPI from H&P)   71 year old white male history of multiple bowel surgeries status post colectomy and end ileostomy, type 2 diabetes on insulin, morbid obesity BMI 48, wheelchair-bound, history of pacemaker, history of diastolic heart failure, hypertension, CKD stage IV baseline creatinine 2.2-2.5, chronic ventral hernia presents to the ER today with no ostomy output since the afternoon of 02/08/2022 he was diagnosed in the ER with small bowel obstruction General surgery was consulted and hospitalist team was requested to admit the patient.   Subjective:    Jesse [name] today has, No headache, No chest pain, No abdominal pain - No Nausea, No new weakness tingling or numbness, no shortness of breath   Assessment  & Plan :   SBO (small bowel obstruction) (HCC) - with history of ventral hernia, currently getting bowel rest with NG tube and IV fluids, maintain electrolytes, supportive care for pain, general surgery has been consulted defer management of this issue to general surgery.    Addendum 02/09/2022 6:40 PM.  Note NG tube having issues since this morning, I was informed around 5 PM by the staff that NG tube was not working since this morning, requested to replace a new one, case discussed with general surgery twice once at 5:30 PM and then at 6:45 PM.  Also evaluated the patient again with daughter at bedside at 6:45 PM on 02/09/2022.  NG tube will  be replaced, supportive care, general surgery coming to take a look again tonight.  CKD (chronic kidney disease) stage 4, GFR 15-29 ml/min (HCC) - baseline SCr 2.2-2.5 - Chronic, stable continue to monitor closely.  Wheelchair dependent - Chronic. Lives at [facility]), PT OT.  Essential hypertension - Pt will be NPO. Add clonidine patch. Prn IV labetalol  Chronic systolic congestive heart failure ([place]) - Will have to hold GDMT due to NPO status. Very cautious IVF.  Obesity, Class III, BMI 40-49.9 (morbid obesity) (HCC) - Chronic. BMI 48, follow-up with PCP for weight loss.  OSA (obstructive sleep apnea) - Pt cannot use CPAP while he has NG tube and SBO.  Ventral hernia - Chronic.  Incidental finding of probably cirrhosis of liver and nonspecific renal findings.  Will need dedicated outpatient renal and liver ultrasound to be done by PCP few weeks after discharge.    Type 2 diabetes mellitus (HCC) Adjust insulin regimen for better control.  CBG (last 3)  Recent Labs    02/09/22 0607 02/09/22 0747  GLUCAP 323* 318*        Condition - Fair  Family Communication  :  None  Code Status :  Full  Consults  :  CCS  PUD Prophylaxis : PPI   Procedures  :     CT - 1. Distention of the stomach and proximal small bowel measuring up to 4.7 cm to the level of the left spigelian hernia. The small bowel in the spigelian hernia pouch measures up to 4 cm. The remaining distal small-bowel is decompressed. Findings are concerning for small bowel obstruction, however the hernia pouch is only partially visualized on exam due to patient's body habitus. Surgical consultation is recommended. 2. Large ventral abdominal wall hernia containing a portion of the stomach, small bowel, and colon, only partially visualized on exam. 3. Nodular contour of the liver, suggesting underlying cirrhosis. 4. Exophytic isodense lesion in the posterior aspect of the left kidney which is new  from the previous exam. Ultrasound is recommended for further characterization. 5. Small left pleural effusion with rounded opacities at the left lung base, possible atelectasis or pneumonia. The possibility of underlying malignancy can not be completely excluded. 6. Aortic atherosclerosis.      Disposition Plan  :    Status is: Inpatient  DVT Prophylaxis  :    heparin injection 5,000 Units Start: 02/09/22 0600 SCDs Start: 02/09/22 0429    Lab Results  Component Value Date   PLT 249 02/09/2022    Diet :  Diet Order             Diet NPO time specified  Diet effective now                    Inpatient Medications  Scheduled Meds:  cloNIDine  0.2 mg Transdermal Weekly   heparin  5,000 Units Subcutaneous Q8H   insulin aspart  0-15 Units Subcutaneous Q4H   insulin glargine-yfgn  10 Units Subcutaneous QHS   pantoprazole (PROTONIX) IV  40 mg Intravenous Q24H   Continuous Infusions:  lactated ringers 1,000 mL with potassium chloride 30 mEq infusion     PRN Meds:.hydrALAZINE, HYDROmorphone (DILAUDID) injection, labetalol, ondansetron (ZOFRAN) IV, phenol, promethazine  Antibiotics  :    Anti-infectives (From admission, onward)    None         Objective:   Vitals:   02/09/22 0015 02/09/22 0030 02/09/22 0352 02/09/22 0753  BP: 137/66 124/69 (!) 145/70 (!) 115/52  Pulse: 70 70 70 81  Resp: 19 19 20 18   Temp:    98.5 F (36.9 C)  TempSrc:      SpO2: 92% 93% 98% 98%  Weight:      Height:        Wt Readings from Last 3 Encounters:  02/09/22 (!) 163.3 kg  12/28/21 Marland Kitchen)  160.6 kg  12/22/21 (!) 158.8 kg    No intake or output data in the 24 hours ending 02/09/22 0911   Physical Exam  Awake Alert, No new F.N deficits, NG tube in place, ostomy bag .AT,PERRAL Supple Neck, No JVD,   Symmetrical Chest wall movement, Good air movement bilaterally, CTAB RRR,No Gallops,Rubs or new Murmurs,  +ve B.Sounds, Abd Soft, No tenderness,   No Cyanosis, Clubbing or  edema        Data Review:    Recent Labs  Lab 02/09/22 0039 02/09/22 0452  WBC 10.5 11.9*  HGB 10.5* 11.0*  HCT 33.6* 34.4*  PLT 245 249  MCV 90.8 90.1  MCH 28.4 28.8  MCHC 31.3 32.0  RDW 15.3 15.5  LYMPHSABS 0.8 0.4*  MONOABS 1.0 0.7  EOSABS 0.1 0.0  BASOSABS 0.0 0.0    Recent Labs  Lab 02/09/22 0039 02/09/22 0452  NA 135 134*  K 3.4* 3.5  CL 96* 94*  CO2 26 25  ANIONGAP 13 15  GLUCOSE 270* 316*  BUN 34* 37*  CREATININE 2.22* 2.34*  AST 20 20  ALT 15 14  ALKPHOS 59 65  BILITOT 0.7 0.8  ALBUMIN 3.5 3.5  MG  --  2.7*  CALCIUM 9.1 9.3      Radiology Reports CT ABDOMEN PELVIS W CONTRAST  Result Date: 02/09/2022 CLINICAL DATA:  Bowel obstruction suspected, history of multiple surgeries. No osteotomy output in 6 hours. Abdominal distention. EXAM: CT ABDOMEN AND PELVIS WITH CONTRAST TECHNIQUE: Multidetector CT imaging of the abdomen and pelvis was performed using the standard protocol following bolus administration of intravenous contrast. RADIATION DOSE REDUCTION: This exam was performed according to the departmental dose-optimization program which includes automated exposure control, adjustment of the mA and/or kV according to patient size and/or use of iterative reconstruction technique. CONTRAST:  39mL OMNIPAQUE IOHEXOL 350 MG/ML SOLN COMPARISON:  03/06/2015. FINDINGS: Lower chest: The heart is enlarged and there is a small pericardial effusion. Pacemaker leads are noted in the heart. With rounded opacities at the left lung base. Right lung base is clear. Hepatobiliary: The liver has a nodular contour suggesting underlying cirrhosis. The gallbladder is without stones. No biliary ductal dilatation. Pancreas: Fatty atrophy. No pancreatic ductal dilatation or surrounding inflammatory changes. Spleen: Normal in size without focal abnormality. Adrenals/Urinary Tract: No adrenal nodule or mass. There is bilateral renal atrophy with renal cortical thinning hearing. A  isoechoic exophytic lesion is present along the posterior aspect of the left kidney measuring 2.5 cm. No renal calculus or hydronephrosis. The bladder is unremarkable. Stomach/Bowel: The distal esophagus is mildly distended with fluid. There is a large complex ventral abdominal wall hernia containing small bowel, stomach, and a portion of the colon in the midline. The hernia pouch is are not well evaluated and incompletely imaged due to field of view and patient's body habitus. There is dilatation of the proximal small bowel with air-fluid levels measuring up to 4.7 cm to the level of a spigelian hernia on the left. The visualized loops of small bowel in the spigelian hernia pouch are distended measuring 4 cm, however only partially included in the field of view. The remaining loops of small bowel in the abdomen and hernia pouch are decompressed. No free air or pneumatosis. An ostomy is noted in the right lower quadrant. Vascular/Lymphatic: Aortic atherosclerosis. No enlarged abdominal or pelvic lymph nodes. Reproductive: Prostate is unremarkable. Other: No abdominopelvic ascites. Musculoskeletal: Degenerative changes in the thoracolumbar spine. No acute osseous abnormality. IMPRESSION: 1. Distention  of the stomach and proximal small bowel measuring up to 4.7 cm to the level of the left spigelian hernia. The small bowel in the spigelian hernia pouch measures up to 4 cm. The remaining distal small-bowel is decompressed. Findings are concerning for small bowel obstruction, however the hernia pouch is only partially visualized on exam due to patient's body habitus. Surgical consultation is recommended. 2. Large ventral abdominal wall hernia containing a portion of the stomach, small bowel, and colon, only partially visualized on exam. 3. Nodular contour of the liver, suggesting underlying cirrhosis. 4. Exophytic isodense lesion in the posterior aspect of the left kidney which is new from the previous exam. Ultrasound  is recommended for further characterization. 5. Small left pleural effusion with rounded opacities at the left lung base, possible atelectasis or pneumonia. The possibility of underlying malignancy can not be completely excluded. 6. Aortic atherosclerosis. 7. Remaining findings as described above. Electronically Signed   By: Brett Fairy M.D.   On: 02/09/2022 03:26      Signature  -   Lala Lund M.D on 02/09/2022 at 9:11 AM   -  To page go to [email or web address]

## 2022-02-09 NOTE — Assessment & Plan Note (Signed)
Add scheduled SSI.

## 2022-02-09 NOTE — Assessment & Plan Note (Signed)
Will have to hold GDMT due to NPO status. Very cautious IVF.

## 2022-02-09 NOTE — H&P (Signed)
History and Physical    Jesse [name] TDD:[id number] DOB: Feb 15, 1951 DOA: 02/08/2022  DOS: the patient was seen and examined on 02/08/2022  PCP: Ngetich, Nelda Bucks, NP   Patient coming from: [facility] independent living)  I have personally briefly reviewed patient's old medical records in [place]  CC: no ostomy output HPI: 71 year old white male history of multiple bowel surgeries status post colectomy and end ileostomy, type 2 diabetes on insulin, morbid obesity BMI 48, wheelchair-bound, history of pacemaker, history of diastolic heart failure, hypertension, CKD stage IV baseline creatinine 2.2-2.5, chronic ventral hernia presents to the ER today with no ostomy output since the afternoon of 12 21,023.  He states that he normally changes his ostomy bag several times a day.  He noticed some slight abdominal pain but no ostomy output.  Came to the ER for evaluation.  CT scan demonstrated small bowel obstruction at the level of the left spigelian hernia.  Labs are stable for him.  EDP has consulted general surgery.  They recommended NG tube replacement.  And admission to the hospitalist service.  [facility] contacted for admission.   ED Course: CT shows SBO  Review of Systems:  Review of Systems  Constitutional: Negative.   HENT: Negative.    Eyes: Negative.   Respiratory: Negative.    Cardiovascular: Negative.   Gastrointestinal:  Positive for abdominal pain, nausea and vomiting.       No ileostomy output  Genitourinary: Negative.   Musculoskeletal: Negative.   Skin: Negative.   Neurological: Negative.   Endo/Heme/Allergies: Negative.   Psychiatric/Behavioral: Negative.    All other systems reviewed and are negative.   Past Medical History:  Diagnosis Date   Alcohol use disorder in remission    Anxiety and depression    At high risk for falls    Former cigarette smoker    Generalized anxiety disorder    Hard of hearing    Hyperlipidemia  due to type 2 diabetes mellitus ([place])    Hypertension    Morbid obesity ([place])    Neuropathy    O2 dependent    Risk for falls    Sleep apnea    Type 2 diabetes mellitus ([place])     Past Surgical History:  Procedure Laterality Date   APPENDECTOMY  1980   IR THORACENTESIS ASP PLEURAL SPACE W/IMG GUIDE  05/18/2021   IR THORACENTESIS ASP PLEURAL SPACE W/IMG GUIDE  10/06/2021   KNEE SURGERY Right    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   Procedure: PACEMAKER IMPLANT;  Surgeon: Deboraha Sprang, MD;  Location: [facility];  Service: Cardiovascular;  Laterality: N/A;     reports that he quit smoking about 24 years ago. His smoking use included cigarettes. He smoked an average of 1 pack per day. He has never used smokeless tobacco. He reports that he does not currently use alcohol. He reports that he does not use drugs.  Allergies  Allergen Reactions   Tape Rash    Family History  Problem Relation Age of Onset   Diabetes Brother    Bipolar disorder Daughter    Anxiety disorder Son     Prior to Admission medications   Medication Sig Start Date End Date Taking? Authorizing Provider  alfuzosin (UROXATRAL) 10 MG 24 hr tablet TAKE 1 TABLET BY MOUTH EVERY DAY 07/28/21   Lauree Chandler, NP  ALPRAZolam Duanne Moron) 0.25 MG tablet Take 2 tablets (0.5 mg total) by mouth 2 (two) times daily as  needed for anxiety or sleep. 12/22/21   Ngetich, [name] C, NP  Blood Glucose Monitoring Suppl (ONE TOUCH ULTRA MINI) w/Device KIT Use to test blood sugar once daily. Dx:E11.22 04/12/21   Ngetich, [name] C, NP  calcium carbonate (TUMS - DOSED IN MG ELEMENTAL CALCIUM) 500 MG chewable tablet Chew 1 tablet by mouth daily.    [provider]  Carboxymethylcellulose Sodium (DRY EYE RELIEF OP) Place 1 drop into both eyes daily as needed (dry eyes).    [provider]  Cholecalciferol (VITAMIN D3) 50 MCG (2000 UT) capsule Take 0.5 capsules (1,000 Units total) by mouth daily. 12/22/21   Ngetich, [name] C, NP   clopidogrel (PLAVIX) 75 MG tablet Take 1 tablet (75 mg total) by mouth daily. 11/03/21   Oswald Hillock, MD  cyanocobalamin 1000 MCG tablet Take 1,000 mcg by mouth daily.    [provider]  DULoxetine (CYMBALTA) 60 MG capsule Take 1 capsule (60 mg total) by mouth daily. 11/27/21   Ngetich, [name] C, NP  empagliflozin (JARDIANCE) 25 MG TABS tablet TAKE 1 TABLET BY MOUTH EVERY DAY 10/24/21   Ngetich, [name] C, NP  FEROSUL 325 (65 Fe) MG tablet TAKE 1 TABLET(325 MG) BY MOUTH DAILY WITH BREAKFAST 01/31/22   Ngetich, [name] C, NP  furosemide (LASIX) 40 MG tablet Take 80 mg by mouth in the morning and at bedtime.    [provider]  glucose blood test strip Use to test blood sugar once daily. Dx:E11.22 04/12/21   Ngetich, [name] C, NP  insulin detemir (LEVEMIR FLEXPEN) 100 UNIT/ML FlexPen Inject 42 Units into the skin at bedtime.    [provider]  Insulin Pen Needle 32G X 4 MM MISC Use to inject insulin twice daily. Dx: E11.22 09/11/21   Ngetich, Nelda Bucks, NP  isosorbide mononitrate (IMDUR) 60 MG 24 hr tablet Take 1 tablet (60 mg total) by mouth daily. 01/25/22   Ngetich, [name] C, NP  ketoconazole (NIZORAL) 2 % cream Apply 1 application topically as needed for irritation. 03/16/21   Ngetich, Nelda Bucks, NP  Lancet Device MISC 1 Device by Does not apply route daily. 06/16/21   Ngetich, [name] C, NP  Lancets MISC 1 Device by Does not apply route as directed. Use as directed. 06/23/21   Ngetich, [name] C, NP  Loperamide HCl (IMODIUM PO) Take 6 mg by mouth 3 (three) times daily with meals.    [provider]  losartan (COZAAR) 25 MG tablet Take 25 mg by mouth daily.    [provider]  NYSTATIN powder Apply 1 application  topically daily as needed (Yeast infection to afffected area). 05/03/21   [provider]  OVER THE COUNTER MEDICATION Apply 1 Application topically every other day. Head and shoulder medicated shampoo alternate with selsun blue medicated    [provider]  OXYGEN Inhale 2 L into the lungs continuous.    [provider]  pantoprazole (PROTONIX) 40 MG tablet Take 1 tablet (40 mg total) by mouth daily. 12/28/21   Ngetich, [name] C, NP  potassium chloride SA (KLOR-CON M) 20 MEQ tablet Take 20 mEq by mouth 2 (two) times daily.    [provider]  Red Yeast Rice 600 MG CAPS Take 1,200 mg by mouth daily.    [provider]  rosuvastatin (CRESTOR) 10 MG tablet TAKE 1 TABLET BY MOUTH EVERY DAY 11/27/21   Ngetich, [name] C, NP  simethicone (MYLICON) 80 MG chewable tablet Chew 80 mg by mouth every 6 (  six) hours as needed for flatulence.    [provider]  topiramate (TOPAMAX) 25 MG tablet TAKE 1 TABLET BY MOUTH EVERY DAY 10/24/21   Ngetich, [name] C, NP  traMADol (ULTRAM) 50 MG tablet TAKE 1 TABLET(50 MG) BY MOUTH TWICE DAILY AS NEEDED 10/03/21   Ngetich, [name] C, NP  traZODone (DESYREL) 50 MG tablet TAKE 2 TABLETS(100 MG) BY MOUTH AT BEDTIME 01/05/22   Ngetich, [name] C, NP  triamcinolone cream (KENALOG) 0.1 % Apply 1 application. topically 2 (two) times daily. Apply to affected areas on shin area 06/30/21   Ngetich, [name] C, NP  TURMERIC PO Take 1,000 mg by mouth daily.    [provider]  VICTOZA 18 MG/3ML SOPN ADMINISTER 1.8 MG UNDER THE SKIN EVERY DAY 06/28/21   Ngetich, Nelda Bucks, NP    Physical Exam: Vitals:   02/09/22 0006 02/09/22 0015 02/09/22 0030 02/09/22 0352  BP:  137/66 124/69 (!) 145/70  Pulse:  70 70 70  Resp:  _0 Temp: 97.7 F (36.5 C)     TempSrc: Oral     SpO2:  92% 93% 98%  Weight:      Height:        Physical Exam Vitals and nursing note reviewed.  Constitutional:      General: He is not in acute distress.    Appearance: He is obese.  HENT:     Head: Normocephalic and atraumatic.     Nose: Nose normal.  Cardiovascular:     Rate and Rhythm: Normal rate and regular rhythm.  Pulmonary:     Effort: Pulmonary effort is normal.     Breath sounds: Normal breath sounds.   Abdominal:     General: Bowel sounds are increased.     Hernia: A hernia is present.     Comments: Large ventral hernia  Musculoskeletal:     Right lower leg: Edema present.     Left lower leg: Edema present.  Skin:    General: Skin is warm and dry.     Capillary Refill: Capillary refill takes less than 2 seconds.  Neurological:     General: No focal deficit present.     Mental Status: He is oriented to person, place, and time.      Labs on Admission: I have personally reviewed following labs and imaging studies  CBC: Recent Labs  Lab 02/09/22 0039  WBC 10.5  NEUTROABS 8.5*  HGB 10.5*  HCT 33.6*  MCV 90.8  PLT 397   Basic Metabolic Panel: Recent Labs  Lab 02/09/22 0039  NA 135  K 3.4*  CL 96*  CO2 26  GLUCOSE 270*  BUN 34*  CREATININE 2.22*  CALCIUM 9.1   GFR: Estimated Creatinine Clearance: 47.6 mL/min (A) (by C-G formula based on SCr of 2.22 mg/dL (H)). Liver Function Tests: Recent Labs  Lab 02/09/22 0039  AST 20  ALT 15  ALKPHOS 59  BILITOT 0.7  PROT 7.2  ALBUMIN 3.5   Recent Labs  Lab 02/09/22 0039  LIPASE 55*   No results for input(s): "AMMONIA" in the last 168 hours. Coagulation Profile: No results for input(s): "INR", "PROTIME" in the last 168 hours. Cardiac Enzymes: No results for input(s): "CKTOTAL", "CKMB", "CKMBINDEX", "TROPONINI", "TROPONINIHS" in the last 168 hours. BNP (last 3 results) No results for input(s): "PROBNP" in the last 8760 hours. HbA1C: No results for input(s): "HGBA1C" in the last 72 hours. CBG: No results for input(s): "GLUCAP" in the last 168 hours. Lipid  Profile: No results for input(s): "CHOL", "HDL", "LDLCALC", "TRIG", "CHOLHDL", "LDLDIRECT" in the last 72 hours. Thyroid Function Tests: No results for input(s): "TSH", "T4TOTAL", "FREET4", "T3FREE", "THYROIDAB" in the last 72 hours. Anemia Panel: No results for input(s): "VITAMINB12", "FOLATE", "FERRITIN", "TIBC", "IRON", "RETICCTPCT" in the last 72  hours. Urine analysis:    Component Value Date/Time   COLORURINE COLORLESS (A) 10/31/2021 1920   APPEARANCEUR CLEAR 10/31/2021 1920   LABSPEC 1.008 10/31/2021 1920   PHURINE 5.0 10/31/2021 1920   GLUCOSEU 500 (A) 10/31/2021 1920   HGBUR NEGATIVE 10/31/2021 1920   BILIRUBINUR NEGATIVE 10/31/2021 1920   BILIRUBINUR negative 08/25/2021 1200   KETONESUR NEGATIVE 10/31/2021 1920   PROTEINUR NEGATIVE 10/31/2021 1920   UROBILINOGEN 0.2 08/25/2021 1200   UROBILINOGEN 1.0 04/08/2008 1200   NITRITE NEGATIVE 10/31/2021 1920   LEUKOCYTESUR NEGATIVE 10/31/2021 1920    Radiological Exams on Admission: I have personally reviewed images CT ABDOMEN PELVIS W CONTRAST  Result Date: 02/09/2022 CLINICAL DATA:  Bowel obstruction suspected, history of multiple surgeries. No osteotomy output in 6 hours. Abdominal distention. EXAM: CT ABDOMEN AND PELVIS WITH CONTRAST TECHNIQUE: Multidetector CT imaging of the abdomen and pelvis was performed using the standard protocol following bolus administration of intravenous contrast. RADIATION DOSE REDUCTION: This exam was performed according to the departmental dose-optimization program which includes automated exposure control, adjustment of the mA and/or kV according to patient size and/or use of iterative reconstruction technique. CONTRAST:  60m OMNIPAQUE IOHEXOL 350 MG/ML SOLN COMPARISON:  03/06/2015. FINDINGS: Lower chest: The heart is enlarged and there is a small pericardial effusion. Pacemaker leads are noted in the heart. With rounded opacities at the left lung base. Right lung base is clear. Hepatobiliary: The liver has a nodular contour suggesting underlying cirrhosis. The gallbladder is without stones. No biliary ductal dilatation. Pancreas: Fatty atrophy. No pancreatic ductal dilatation or surrounding inflammatory changes. Spleen: Normal in size without focal abnormality. Adrenals/Urinary Tract: No adrenal nodule or mass. There is bilateral renal atrophy with  renal cortical thinning hearing. A isoechoic exophytic lesion is present along the posterior aspect of the left kidney measuring 2.5 cm. No renal calculus or hydronephrosis. The bladder is unremarkable. Stomach/Bowel: The distal esophagus is mildly distended with fluid. There is a large complex ventral abdominal wall hernia containing small bowel, stomach, and a portion of the colon in the midline. The hernia pouch is are not well evaluated and incompletely imaged due to field of view and patient's body habitus. There is dilatation of the proximal small bowel with air-fluid levels measuring up to 4.7 cm to the level of a spigelian hernia on the left. The visualized loops of small bowel in the spigelian hernia pouch are distended measuring 4 cm, however only partially included in the field of view. The remaining loops of small bowel in the abdomen and hernia pouch are decompressed. No free air or pneumatosis. An ostomy is noted in the right lower quadrant. Vascular/Lymphatic: Aortic atherosclerosis. No enlarged abdominal or pelvic lymph nodes. Reproductive: Prostate is unremarkable. Other: No abdominopelvic ascites. Musculoskeletal: Degenerative changes in the thoracolumbar spine. No acute osseous abnormality. IMPRESSION: 1. Distention of the stomach and proximal small bowel measuring up to 4.7 cm to the level of the left spigelian hernia. The small bowel in the spigelian hernia pouch measures up to 4 cm. The remaining distal small-bowel is decompressed. Findings are concerning for small bowel obstruction, however the hernia pouch is only partially visualized on exam due to patient's body habitus. Surgical consultation is recommended. 2.  Large ventral abdominal wall hernia containing a portion of the stomach, small bowel, and colon, only partially visualized on exam. 3. Nodular contour of the liver, suggesting underlying cirrhosis. 4. Exophytic isodense lesion in the posterior aspect of the left kidney which is new  from the previous exam. Ultrasound is recommended for further characterization. 5. Small left pleural effusion with rounded opacities at the left lung base, possible atelectasis or pneumonia. The possibility of underlying malignancy can not be completely excluded. 6. Aortic atherosclerosis. 7. Remaining findings as described above. Electronically Signed   By: Brett Fairy M.D.   On: 02/09/2022 03:26    EKG: My personal interpretation of EKG shows: no EKG    Assessment/Plan Principal Problem:   SBO (small bowel obstruction) (HCC) Active Problems:   Type 2 diabetes mellitus (HCC)   Ventral hernia   OSA (obstructive sleep apnea)   Obesity, Class III, BMI 40-49.9 (morbid obesity) (HCC)   Chronic systolic congestive heart failure ([place])   Essential hypertension   Wheelchair dependent   CKD (chronic kidney disease) stage 4, GFR 15-29 ml/min (HCC) - baseline SCr 2.2-2.5    Assessment and Plan: * SBO (small bowel obstruction) ([place]) Admit to med/surg patient. Inpatient status. NG tube placement. Prn IV dilaudid. EDP has consulted general surgery. Gentle IVF.  CKD (chronic kidney disease) stage 4, GFR 15-29 ml/min (HCC) - baseline SCr 2.2-2.5 Chronic.  Wheelchair dependent Chronic. Lives at [facility](independent living)  Essential hypertension Pt will be NPO. Add clonidine patch. Prn IV labetalol  Chronic systolic congestive heart failure ([place]) Will have to hold GDMT due to NPO status. Very cautious IVF.  Obesity, Class III, BMI 40-49.9 (morbid obesity) (HCC) Chronic. BMI 48  OSA (obstructive sleep apnea) Pt cannot use CPAP while he has NG tube and SBO.  Ventral hernia Chronic.  Type 2 diabetes mellitus ([place]) Add scheduled SSI.   DVT prophylaxis: SQ Heparin Code Status: Full Code Family Communication: no family at bedside  Disposition Plan: return home([place])  Consults called: EDP has consulted general surgery  Admission status: Inpatient,  Med-Surg   Kristopher Oppenheim, DO [facility] 02/09/2022, 4:15 AM

## 2022-02-09 NOTE — Assessment & Plan Note (Signed)
Pt cannot use CPAP while he has NG tube and SBO.

## 2022-02-09 NOTE — ED Triage Notes (Signed)
Pt bib [facility] from [facility] independent living c/o increased hernia pain and swelling since this evening and no ostomy output for 6 hours. Hx bowel obstruction, bowel sounds present. Pt also reports nausea, dry heaving upon arrival  BP 132/70 HR 70 (pacemaker) SPO2 97% RA CBG 291

## 2022-02-09 NOTE — Assessment & Plan Note (Signed)
Chronic. BMI 48

## 2022-02-10 ENCOUNTER — Inpatient Hospital Stay (HOSPITAL_COMMUNITY): Payer: Medicare PPO

## 2022-02-10 DIAGNOSIS — K56609 Unspecified intestinal obstruction, unspecified as to partial versus complete obstruction: Secondary | ICD-10-CM | POA: Diagnosis not present

## 2022-02-10 LAB — GLUCOSE, CAPILLARY
Glucose-Capillary: 275 mg/dL — ABNORMAL HIGH (ref 70–99)
Glucose-Capillary: 216 mg/dL — ABNORMAL HIGH (ref 70–99)
Glucose-Capillary: 253 mg/dL — ABNORMAL HIGH (ref 70–99)
Glucose-Capillary: 223 mg/dL — ABNORMAL HIGH (ref 70–99)
Glucose-Capillary: 225 mg/dL — ABNORMAL HIGH (ref 70–99)

## 2022-02-10 LAB — BASIC METABOLIC PANEL
Anion gap: 14 (ref 5–15)
BUN: 61 mg/dL — ABNORMAL HIGH (ref 8–23)
CO2: 23 mmol/L (ref 22–32)
Calcium: 8.9 mg/dL (ref 8.9–10.3)
Chloride: 97 mmol/L — ABNORMAL LOW (ref 98–111)
Creatinine, Ser: 3.18 mg/dL — ABNORMAL HIGH (ref 0.61–1.24)
GFR, Estimated: 20 mL/min — ABNORMAL LOW (ref >60)
Glucose, Bld: 326 mg/dL — ABNORMAL HIGH (ref 70–99)
Potassium: 4 mmol/L (ref 3.5–5.1)
Sodium: 134 mmol/L — ABNORMAL LOW (ref 135–145)

## 2022-02-10 LAB — URINALYSIS, ROUTINE W REFLEX MICROSCOPIC
Bilirubin Urine: NEGATIVE
Glucose, UA: 150 mg/dL — AB
Hgb urine dipstick: NEGATIVE
Ketones, ur: NEGATIVE mg/dL
Leukocytes,Ua: NEGATIVE
Nitrite: NEGATIVE
Protein, ur: NEGATIVE mg/dL
Specific Gravity, Urine: 1.024 (ref 1.005–1.030)
pH: 5 (ref 5.0–8.0)

## 2022-02-10 LAB — CBC WITH DIFFERENTIAL/PLATELET
Abs Immature Granulocytes: 0.02 10*3/uL (ref 0.00–0.07)
Basophils Absolute: 0 10*3/uL (ref 0.0–0.1)
Basophils Relative: 0 %
Eosinophils Absolute: 0 10*3/uL (ref 0.0–0.5)
Eosinophils Relative: 0 %
HCT: 35.3 % — ABNORMAL LOW (ref 39.0–52.0)
Hemoglobin: 11.6 g/dL — ABNORMAL LOW (ref 13.0–17.0)
Immature Granulocytes: 0 %
Lymphocytes Relative: 8 %
Lymphs Abs: 0.5 10*3/uL — ABNORMAL LOW (ref 0.7–4.0)
MCH: 28.9 pg (ref 26.0–34.0)
MCHC: 32.9 g/dL (ref 30.0–36.0)
MCV: 87.8 fL (ref 80.0–100.0)
Monocytes Absolute: 1.2 10*3/uL — ABNORMAL HIGH (ref 0.1–1.0)
Monocytes Relative: 20 %
Neutro Abs: 4 10*3/uL (ref 1.7–7.7)
Neutrophils Relative %: 72 %
Platelets: 275 10*3/uL (ref 150–400)
RBC: 4.02 MIL/uL — ABNORMAL LOW (ref 4.22–5.81)
RDW: 15.7 % — ABNORMAL HIGH (ref 11.5–15.5)
WBC: 5.7 10*3/uL (ref 4.0–10.5)
nRBC: 0 % (ref 0.0–0.2)

## 2022-02-10 LAB — RENAL FUNCTION PANEL
Albumin: 3.4 g/dL — ABNORMAL LOW (ref 3.5–5.0)
Anion gap: 19 — ABNORMAL HIGH (ref 5–15)
BUN: 75 mg/dL — ABNORMAL HIGH (ref 8–23)
CO2: 19 mmol/L — ABNORMAL LOW (ref 22–32)
Calcium: 8.9 mg/dL (ref 8.9–10.3)
Chloride: 100 mmol/L (ref 98–111)
Creatinine, Ser: 3.92 mg/dL — ABNORMAL HIGH (ref 0.61–1.24)
GFR, Estimated: 16 mL/min — ABNORMAL LOW (ref >60)
Glucose, Bld: 219 mg/dL — ABNORMAL HIGH (ref 70–99)
Phosphorus: 4.9 mg/dL — ABNORMAL HIGH (ref 2.5–4.6)
Potassium: 4.3 mmol/L (ref 3.5–5.1)
Sodium: 138 mmol/L (ref 135–145)

## 2022-02-10 LAB — OSMOLALITY: Osmolality: 317 mOsm/kg — ABNORMAL HIGH (ref 275–295)

## 2022-02-10 LAB — URIC ACID: Uric Acid, Serum: 12.3 mg/dL — ABNORMAL HIGH (ref 3.7–8.6)

## 2022-02-10 LAB — MAGNESIUM: Magnesium: 2.8 mg/dL — ABNORMAL HIGH (ref 1.7–2.4)

## 2022-02-10 LAB — CREATININE, URINE, RANDOM: Creatinine, Urine: 117 mg/dL

## 2022-02-10 LAB — BRAIN NATRIURETIC PEPTIDE: B Natriuretic Peptide: 82.5 pg/mL (ref 0.0–100.0)

## 2022-02-10 LAB — OSMOLALITY, URINE: Osmolality, Ur: 371 mOsm/kg (ref 300–900)

## 2022-02-10 LAB — SODIUM, URINE, RANDOM: Sodium, Ur: <10 mmol/L

## 2022-02-10 MED ORDER — SODIUM CHLORIDE 0.9 % IV SOLN: 25.0000 mg | Freq: Four times a day (QID) | INTRAVENOUS | Status: DC | PRN

## 2022-02-10 MED ORDER — DIATRIZOATE MEGLUMINE & SODIUM 66-10 % PO SOLN
90.0000 mL | Freq: Once | ORAL | Status: AC
  Administered 2022-02-10: 90 mL via NASOGASTRIC
  Filled 2022-02-10: qty 90

## 2022-02-10 MED ORDER — SODIUM CHLORIDE 0.9 % IV SOLN
12.5000 mg | Freq: Three times a day (TID) | INTRAVENOUS | Status: DC | PRN
  Administered 2022-02-10 – 2022-02-12 (×3): 12.5 mg via INTRAVENOUS
  Filled 2022-02-10 (×3): qty 0.5
  Filled 2022-02-10: qty 12.5
  Filled 2022-02-10: qty 0.5

## 2022-02-10 MED ORDER — SODIUM CHLORIDE 0.9 % IV SOLN
12.5000 mg | Freq: Four times a day (QID) | INTRAVENOUS | Status: DC | PRN
  Administered 2022-02-10: 12.5 mg via INTRAVENOUS
  Filled 2022-02-10: qty 0.5

## 2022-02-10 MED ORDER — POTASSIUM CHLORIDE 2 MEQ/ML IV SOLN
INTRAVENOUS | Status: DC
  Filled 2022-02-10: qty 1000

## 2022-02-10 MED ORDER — SODIUM CHLORIDE 0.9 % IV SOLN: 12.5000 mg | Freq: Four times a day (QID) | INTRAVENOUS | Status: DC | PRN

## 2022-02-10 MED ORDER — LACTATED RINGERS IV BOLUS
500.0000 mL | Freq: Once | INTRAVENOUS | Status: AC
  Administered 2022-02-10: 500 mL via INTRAVENOUS

## 2022-02-10 MED ORDER — POTASSIUM CHLORIDE 2 MEQ/ML IV SOLN
INTRAVENOUS | Status: DC
  Filled 2022-02-10 (×3): qty 1000

## 2022-02-10 MED ORDER — INSULIN GLARGINE-YFGN 100 UNIT/ML ~~LOC~~ SOLN
25.0000 [IU] | Freq: Every day | SUBCUTANEOUS | Status: DC
  Administered 2022-02-10 – 2022-02-13 (×4): 25 [IU] via SUBCUTANEOUS
  Filled 2022-02-10 (×4): qty 0.25

## 2022-02-10 NOTE — Progress Notes (Addendum)
PROGRESS NOTE                                                                                                                                                                                                             Patient Demographics:    Jesse [name], is a 71 y.o. 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   Subjective:   In bed denies any headache or chest pain, still having some abdominal pain and nausea, NG tube has helped some, still refusing surgery.  No focal deficits.   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 Faythe Ghee in [facility], he was left open and went back for washout and closure the following day  - with history of ventral hernia, currently getting bowel rest with NG tube and IV fluids, maintain electrolytes, supportive care for pain and nausea, discussed with general surgery x 4, rediscussed twice on 02/10/2022.  Patient still refusing surgery.  Continue to monitor with supportive care.  NG tube in good position with suctioning.  Continue bowel rest.  AKI on  CKD (chronic kidney disease) stage 4, GFR 15-29 ml/min (HCC) - baseline SCr 2.2-2.5 - due to SBO and NG tube suction and n.p.o. status he appears dehydrated clinically, urine sodium 1-10, added IV fluid bolus and increased maintenance rate, avoid nephrotoxins, nephrology on board.  Foley inserted for strict monitoring of urine output in the setting of his body habitus where he is incontinent and unable to use urinal.  Wheelchair dependent - Chronic. Lives at [facility]), PT OT.  Essential hypertension - Pt will be NPO. Add clonidine patch. Prn IV labetalol  Chronic systolic congestive heart failure ([place]) - Will have to hold GDMT due to NPO status. Very cautious IVF.  Obesity, Class III, BMI 40-49.9 (morbid obesity) (HCC) - Chronic. BMI 48, follow-up with PCP for weight loss.  OSA (obstructive sleep apnea) - Pt cannot use CPAP while he has NG tube and SBO.  Ventral hernia - Chronic.  Incidental finding of probably cirrhosis of liver and nonspecific renal findings.  Will need dedicated outpatient renal and liver ultrasound to be done by PCP few weeks after discharge.    Type 2 diabetes mellitus (HCC) Adjust insulin regimen for better control.  CBG (last 3)  Recent Labs    02/09/22 2332 02/10/22 0440 02/10/22 0748  GLUCAP 339* 275* 216*        Condition - Fair  Family Communication  : Daughter Denny Peon [phone] at bedside on 02/09/2022, 02/10/22   Code Status :  Full  Consults  :  CCS  PUD Prophylaxis : PPI   Procedures  :     CT - 1. Distention of the stomach and proximal small bowel measuring up to 4.7 cm to the level of the left spigelian hernia. The small bowel in the spigelian hernia pouch measures up to 4 cm. The remaining distal small-bowel is decompressed. Findings are concerning for small bowel obstruction, however the hernia pouch is only partially visualized on exam due to patient's body habitus. Surgical consultation is recommended. 2. Large  ventral abdominal wall hernia containing a portion of the stomach, small bowel, and colon, only partially visualized on exam. 3. Nodular contour of the liver, suggesting underlying cirrhosis. 4. Exophytic isodense lesion in the posterior aspect of the left kidney which is new from the previous exam. Ultrasound is recommended for further characterization. 5. Small left pleural effusion with rounded opacities at the left lung base, possible atelectasis or pneumonia. The possibility of underlying malignancy can not be completely excluded. 6. Aortic atherosclerosis.      Disposition Plan  :    Status is: Inpatient  DVT Prophylaxis  :    heparin injection 5,000 Units Start: 02/09/22 0600 SCDs Start: 02/09/22 0429    Lab Results  Component Value Date   PLT 275 02/10/2022    Diet :  Diet Order             Diet NPO time specified Except for: Sips with Meds  Diet effective now                    Inpatient Medications  Scheduled Meds:  alfuzosin  10 mg Oral Q breakfast   cloNIDine  0.2 mg Transdermal Weekly   diatrizoate meglumine-sodium  90 mL Per NG tube Once   heparin  5,000 Units Subcutaneous Q8H   insulin aspart  0-15 Units Subcutaneous Q4H   insulin glargine-yfgn  25 Units Subcutaneous Daily   isosorbide mononitrate  60 mg Oral Daily   pantoprazole (PROTONIX) IV  40 mg Intravenous Q24H   rosuvastatin  10 mg Oral Daily   topiramate  25 mg Oral Daily   Continuous Infusions:  lactated ringers 1,000 mL with potassium chloride 20 mEq infusion     lactated ringers     promethazine (PHENERGAN) injection (IM or IVPB) 12.5 mg (02/10/22 0958)   PRN Meds:.ALPRAZolam, hydrALAZINE, HYDROmorphone (DILAUDID) injection, labetalol, ondansetron (ZOFRAN) IV, phenol, promethazine (PHENERGAN) injection (  IM or IVPB)  Antibiotics  :    Anti-infectives (From admission, onward)    None         Objective:   Vitals:   02/09/22 1147 02/09/22 1616 02/09/22 1934 02/10/22 0442  BP:  (!) 115/59 (!) 122/57 (!) 123/58 (!) 129/59  Pulse: 88 92 88 87  Resp: 20 18 17 19   Temp: 99.3 F (37.4 C) 98.9 F (37.2 C) 97.8 F (36.6 C) 97.9 F (36.6 C)  TempSrc: Oral Oral    SpO2: 99% 100% 96% 99%  Weight:      Height:        Wt Readings from Last 3 Encounters:  02/09/22 (!) 163.3 kg  12/28/21 (!) 160.6 kg  12/22/21 (!) 158.8 kg     Intake/Output Summary (Last 24 hours) at 02/10/2022 1043 Last data filed at 02/10/2022 0202 Gross per 24 hour  Intake 362.5 ml  Output 3375 ml  Net -3012.5 ml     Physical Exam  Awake Alert, No new F.N deficits, NG tube in place, ostomy bag empty, hypoactive bowel sounds, can DC ventral hernia pictures below [place].AT,PERRAL Supple Neck, No JVD,   Symmetrical Chest wall movement, Good air movement bilaterally, CTAB RRR,No Gallops, Rubs or new Murmurs,  No Cyanosis, Clubbing or edema          Data Review:    Recent Labs  Lab 02/09/22 0039 02/09/22 0452 02/10/22 0211  WBC 10.5 11.9* 5.7  HGB 10.5* 11.0* 11.6*  HCT 33.6* 34.4* 35.3*  PLT 245 249 275  MCV 90.8 90.1 87.8  MCH 28.4 28.8 28.9  MCHC 31.3 32.0 32.9  RDW 15.3 15.5 15.7*  LYMPHSABS 0.8 0.4* 0.5*  MONOABS 1.0 0.7 1.2*  EOSABS 0.1 0.0 0.0  BASOSABS 0.0 0.0 0.0    Recent Labs  Lab 02/09/22 0039 02/09/22 0452 02/10/22 0211  NA 135 134* 134*  K 3.4* 3.5 4.0  CL 96* 94* 97*  CO2 26 25 23   ANIONGAP 13 15 14   GLUCOSE 270* 316* 326*  BUN 34* 37* 61*  CREATININE 2.22* 2.34* 3.18*  AST 20 20  --   ALT 15 14  --   ALKPHOS 59 65  --   BILITOT 0.7 0.8  --   ALBUMIN 3.5 3.5  --   BNP  --  48.0 82.5  MG  --  2.7* 2.8*  CALCIUM 9.1 9.3 8.9      Radiology Reports DG Abd Portable 1V  Result Date: 02/10/2022 CLINICAL DATA:  Small-bowel obstruction EXAM: PORTABLE ABDOMEN - 1 VIEW COMPARISON:  Radiograph 02/09/2022 FINDINGS: Nasogastric tube tip and side port overlie the stomach. There is persistent gaseous distension and small bowel dilation. IMPRESSION:  Nasogastric tube tip and side port overlie the stomach, which remains distended, correlate with output. Persistent bowel obstruction. Electronically Signed   By: Maurine Simmering M.D.   On: 02/10/2022 08:51   DG Abd Portable 1V  Result Date: 02/09/2022 CLINICAL DATA:  Check gastric catheter placement EXAM: PORTABLE ABDOMEN - 1 VIEW COMPARISON:  None Available. FINDINGS: Gastric catheter is noted with the tip in the stomach although the proximal side port lies in the distal esophagus. This should be advanced deeper into the stomach. No free air is seen. IMPRESSION: Gastric catheter as described which should be advanced several cm deeper into the stomach. Electronically Signed   By: Inez Catalina M.D.   On: 02/09/2022 21:21   DG Abd Portable 1V  Result Date: 02/09/2022 CLINICAL DATA:  Small bowel obstruction  EXAM: PORTABLE ABDOMEN - 1 VIEW COMPARISON:  02/09/2022, 9:32 a.m. FINDINGS: Examination is extremely limited by body habitus and severe underpenetration. A previously seen esophagogastric tube has been removed. There remains distended stomach and small bowel throughout the ventral abdomen, not obviously changed compared to prior examination, however assessment is extremely limited. No obvious free air. IMPRESSION: Examination is extremely limited by body habitus and severe underpenetration. A previously seen esophagogastric tube has been removed. There remains distended stomach and small bowel throughout the ventral abdomen, not obviously changed compared to prior examination and concerning for bowel obstruction, however assessment is extremely limited. Electronically Signed   By: Delanna Ahmadi M.D.   On: 02/09/2022 17:35   DG Chest Port 1 View  Result Date: 02/09/2022 CLINICAL DATA:  Shortness of breath EXAM: PORTABLE CHEST 1 VIEW COMPARISON:  Previous studies including the chest radiograph done on 10/06/2021 FINDINGS: Transverse diameter of heart is increased. There are no signs of alveolar pulmonary  edema. There is increased density in left lower lung field obscuring the left hemidiaphragm. Pacemaker battery is seen in the left infraclavicular region. Enteric tube is seen in esophagus. Distal end of enteric tube is not adequately visualized to evaluate its location. IMPRESSION: Cardiomegaly. Increased density in left lower lung fields may suggest left pleural effusion and underlying atelectasis/pneumonia. Tip of enteric tube is not adequately visualized and location of the tip could not be evaluated. Electronically Signed   By: Elmer Picker M.D.   On: 02/09/2022 17:33   DG Abd Portable 1V-Small Bowel Protocol-Position Verification  Result Date: 02/09/2022 CLINICAL DATA:  NG tube placed EXAM: PORTABLE ABDOMEN - 1 VIEW COMPARISON:  CT 02/09/2022 FINDINGS: Nasogastric tube tip overlies the stomach, side port near the GE junction. There is marked stomach dilation. Left pleural effusion and adjacent rounded atelectasis, as seen on recent CT. IMPRESSION: Nasogastric tube tip overlies the stomach, side port near the GE junction. Recommend advancement by 7.0 cm. Electronically Signed   By: Maurine Simmering M.D.   On: 02/09/2022 12:50   CT ABDOMEN PELVIS W CONTRAST  Result Date: 02/09/2022 CLINICAL DATA:  Bowel obstruction suspected, history of multiple surgeries. No osteotomy output in 6 hours. Abdominal distention. EXAM: CT ABDOMEN AND PELVIS WITH CONTRAST TECHNIQUE: Multidetector CT imaging of the abdomen and pelvis was performed using the standard protocol following bolus administration of intravenous contrast. RADIATION DOSE REDUCTION: This exam was performed according to the departmental dose-optimization program which includes automated exposure control, adjustment of the mA and/or kV according to patient size and/or use of iterative reconstruction technique. CONTRAST:  60mL OMNIPAQUE IOHEXOL 350 MG/ML SOLN COMPARISON:  03/06/2015. FINDINGS: Lower chest: The heart is enlarged and there is a small  pericardial effusion. Pacemaker leads are noted in the heart. With rounded opacities at the left lung base. Right lung base is clear. Hepatobiliary: The liver has a nodular contour suggesting underlying cirrhosis. The gallbladder is without stones. No biliary ductal dilatation. Pancreas: Fatty atrophy. No pancreatic ductal dilatation or surrounding inflammatory changes. Spleen: Normal in size without focal abnormality. Adrenals/Urinary Tract: No adrenal nodule or mass. There is bilateral renal atrophy with renal cortical thinning hearing. A isoechoic exophytic lesion is present along the posterior aspect of the left kidney measuring 2.5 cm. No renal calculus or hydronephrosis. The bladder is unremarkable. Stomach/Bowel: The distal esophagus is mildly distended with fluid. There is a large complex ventral abdominal wall hernia containing small bowel, stomach, and a portion of the colon in the midline. The hernia pouch is are  not well evaluated and incompletely imaged due to field of view and patient's body habitus. There is dilatation of the proximal small bowel with air-fluid levels measuring up to 4.7 cm to the level of a spigelian hernia on the left. The visualized loops of small bowel in the spigelian hernia pouch are distended measuring 4 cm, however only partially included in the field of view. The remaining loops of small bowel in the abdomen and hernia pouch are decompressed. No free air or pneumatosis. An ostomy is noted in the right lower quadrant. Vascular/Lymphatic: Aortic atherosclerosis. No enlarged abdominal or pelvic lymph nodes. Reproductive: Prostate is unremarkable. Other: No abdominopelvic ascites. Musculoskeletal: Degenerative changes in the thoracolumbar spine. No acute osseous abnormality. IMPRESSION: 1. Distention of the stomach and proximal small bowel measuring up to 4.7 cm to the level of the left spigelian hernia. The small bowel in the spigelian hernia pouch measures up to 4 cm. The  remaining distal small-bowel is decompressed. Findings are concerning for small bowel obstruction, however the hernia pouch is only partially visualized on exam due to patient's body habitus. Surgical consultation is recommended. 2. Large ventral abdominal wall hernia containing a portion of the stomach, small bowel, and colon, only partially visualized on exam. 3. Nodular contour of the liver, suggesting underlying cirrhosis. 4. Exophytic isodense lesion in the posterior aspect of the left kidney which is new from the previous exam. Ultrasound is recommended for further characterization. 5. Small left pleural effusion with rounded opacities at the left lung base, possible atelectasis or pneumonia. The possibility of underlying malignancy can not be completely excluded. 6. Aortic atherosclerosis. 7. Remaining findings as described above. Electronically Signed   By: Brett Fairy M.D.   On: 02/09/2022 03:26      Signature  -   Lala Lund M.D on 02/10/2022 at 10:43 AM   -  To page go to [email or web address]

## 2022-02-10 NOTE — Progress Notes (Signed)
Bladder scanned pt. Numerous times and only 20 ml was detected per bladder machine. Pt. Has only urinated about 253ml for night shift. UA sample sent per Dr. Ronnie Derby orders and he is also aware of reults of bladder scan.

## 2022-02-10 NOTE — Progress Notes (Signed)
Patient ID: Jesse [name], male   DOB: 1950/11/22, 71 y.o.   MRN: [id number]      Subjective: Crampy pain and nausea ROS negative except as listed above. Objective: Vital signs in last 24 hours: Temp:  [97.8 F (36.6 C)-99.3 F (37.4 C)] 97.9 F (36.6 C) (12/23 0442) Pulse Rate:  [87-92] 87 (12/23 0442) Resp:  [17-20] 19 (12/23 0442) BP: (115-129)/(57-59) 129/59 (12/23 0442) SpO2:  [96 %-100 %] 99 % (12/23 0442)    Intake/Output from previous day: 12/22 0701 - 12/23 0700 In: 362.5 [I.V.:362.5] Out: 3375 [Emesis/NG output:3375] Intake/Output this shift: No intake/output data recorded.  General appearance: cooperative Resp: clear to auscultation bilaterally GI: multilobular abdominal wall hernia, upper 2 defects reduce but recur, LLQ hernia not as easy to reduce , not tender, min ostomy out  Lab Results: CBC  Recent Labs    02/09/22 0452 02/10/22 0211  WBC 11.9* 5.7  HGB 11.0* 11.6*  HCT 34.4* 35.3*  PLT 249 275   BMET Recent Labs    02/09/22 0452 02/10/22 0211  NA 134* 134*  K 3.5 4.0  CL 94* 97*  CO2 25 23  GLUCOSE 316* 326*  BUN 37* 61*  CREATININE 2.34* 3.18*  CALCIUM 9.3 8.9   PT/INR No results for input(s): "LABPROT", "INR" in the last 72 hours. ABG No results for input(s): "PHART", "HCO3" in the last 72 hours.  Invalid input(s): "PCO2", "PO2"  Studies/Results: DG Abd Portable 1V  Result Date: 02/10/2022 CLINICAL DATA:  Small-bowel obstruction EXAM: PORTABLE ABDOMEN - 1 VIEW COMPARISON:  Radiograph 02/09/2022 FINDINGS: Nasogastric tube tip and side port overlie the stomach. There is persistent gaseous distension and small bowel dilation. IMPRESSION: Nasogastric tube tip and side port overlie the stomach, which remains distended, correlate with output. Persistent bowel obstruction. Electronically Signed   By: Maurine Simmering M.D.   On: 02/10/2022 08:51   DG Abd Portable 1V  Result Date: 02/09/2022 CLINICAL DATA:  Check gastric catheter placement  EXAM: PORTABLE ABDOMEN - 1 VIEW COMPARISON:  None Available. FINDINGS: Gastric catheter is noted with the tip in the stomach although the proximal side port lies in the distal esophagus. This should be advanced deeper into the stomach. No free air is seen. IMPRESSION: Gastric catheter as described which should be advanced several cm deeper into the stomach. Electronically Signed   By: Inez Catalina M.D.   On: 02/09/2022 21:21   DG Abd Portable 1V  Result Date: 02/09/2022 CLINICAL DATA:  Small bowel obstruction EXAM: PORTABLE ABDOMEN - 1 VIEW COMPARISON:  02/09/2022, 9:32 a.m. FINDINGS: Examination is extremely limited by body habitus and severe underpenetration. A previously seen esophagogastric tube has been removed. There remains distended stomach and small bowel throughout the ventral abdomen, not obviously changed compared to prior examination, however assessment is extremely limited. No obvious free air. IMPRESSION: Examination is extremely limited by body habitus and severe underpenetration. A previously seen esophagogastric tube has been removed. There remains distended stomach and small bowel throughout the ventral abdomen, not obviously changed compared to prior examination and concerning for bowel obstruction, however assessment is extremely limited. Electronically Signed   By: Delanna Ahmadi M.D.   On: 02/09/2022 17:35   DG Chest Port 1 View  Result Date: 02/09/2022 CLINICAL DATA:  Shortness of breath EXAM: PORTABLE CHEST 1 VIEW COMPARISON:  Previous studies including the chest radiograph done on 10/06/2021 FINDINGS: Transverse diameter of heart is increased. There are no signs of alveolar pulmonary edema. There is increased density in  left lower lung field obscuring the left hemidiaphragm. Pacemaker battery is seen in the left infraclavicular region. Enteric tube is seen in esophagus. Distal end of enteric tube is not adequately visualized to evaluate its location. IMPRESSION: Cardiomegaly.  Increased density in left lower lung fields may suggest left pleural effusion and underlying atelectasis/pneumonia. Tip of enteric tube is not adequately visualized and location of the tip could not be evaluated. Electronically Signed   By: Elmer Picker M.D.   On: 02/09/2022 17:33   DG Abd Portable 1V-Small Bowel Protocol-Position Verification  Result Date: 02/09/2022 CLINICAL DATA:  NG tube placed EXAM: PORTABLE ABDOMEN - 1 VIEW COMPARISON:  CT 02/09/2022 FINDINGS: Nasogastric tube tip overlies the stomach, side port near the GE junction. There is marked stomach dilation. Left pleural effusion and adjacent rounded atelectasis, as seen on recent CT. IMPRESSION: Nasogastric tube tip overlies the stomach, side port near the GE junction. Recommend advancement by 7.0 cm. Electronically Signed   By: Maurine Simmering M.D.   On: 02/09/2022 12:50   CT ABDOMEN PELVIS W CONTRAST  Result Date: 02/09/2022 CLINICAL DATA:  Bowel obstruction suspected, history of multiple surgeries. No osteotomy output in 6 hours. Abdominal distention. EXAM: CT ABDOMEN AND PELVIS WITH CONTRAST TECHNIQUE: Multidetector CT imaging of the abdomen and pelvis was performed using the standard protocol following bolus administration of intravenous contrast. RADIATION DOSE REDUCTION: This exam was performed according to the departmental dose-optimization program which includes automated exposure control, adjustment of the mA and/or kV according to patient size and/or use of iterative reconstruction technique. CONTRAST:  13mL OMNIPAQUE IOHEXOL 350 MG/ML SOLN COMPARISON:  03/06/2015. FINDINGS: Lower chest: The heart is enlarged and there is a small pericardial effusion. Pacemaker leads are noted in the heart. With rounded opacities at the left lung base. Right lung base is clear. Hepatobiliary: The liver has a nodular contour suggesting underlying cirrhosis. The gallbladder is without stones. No biliary ductal dilatation. Pancreas: Fatty  atrophy. No pancreatic ductal dilatation or surrounding inflammatory changes. Spleen: Normal in size without focal abnormality. Adrenals/Urinary Tract: No adrenal nodule or mass. There is bilateral renal atrophy with renal cortical thinning hearing. A isoechoic exophytic lesion is present along the posterior aspect of the left kidney measuring 2.5 cm. No renal calculus or hydronephrosis. The bladder is unremarkable. Stomach/Bowel: The distal esophagus is mildly distended with fluid. There is a large complex ventral abdominal wall hernia containing small bowel, stomach, and a portion of the colon in the midline. The hernia pouch is are not well evaluated and incompletely imaged due to field of view and patient's body habitus. There is dilatation of the proximal small bowel with air-fluid levels measuring up to 4.7 cm to the level of a spigelian hernia on the left. The visualized loops of small bowel in the spigelian hernia pouch are distended measuring 4 cm, however only partially included in the field of view. The remaining loops of small bowel in the abdomen and hernia pouch are decompressed. No free air or pneumatosis. An ostomy is noted in the right lower quadrant. Vascular/Lymphatic: Aortic atherosclerosis. No enlarged abdominal or pelvic lymph nodes. Reproductive: Prostate is unremarkable. Other: No abdominopelvic ascites. Musculoskeletal: Degenerative changes in the thoracolumbar spine. No acute osseous abnormality. IMPRESSION: 1. Distention of the stomach and proximal small bowel measuring up to 4.7 cm to the level of the left spigelian hernia. The small bowel in the spigelian hernia pouch measures up to 4 cm. The remaining distal small-bowel is decompressed. Findings are concerning for small bowel  obstruction, however the hernia pouch is only partially visualized on exam due to patient's body habitus. Surgical consultation is recommended. 2. Large ventral abdominal wall hernia containing a portion of the  stomach, small bowel, and colon, only partially visualized on exam. 3. Nodular contour of the liver, suggesting underlying cirrhosis. 4. Exophytic isodense lesion in the posterior aspect of the left kidney which is new from the previous exam. Ultrasound is recommended for further characterization. 5. Small left pleural effusion with rounded opacities at the left lung base, possible atelectasis or pneumonia. The possibility of underlying malignancy can not be completely excluded. 6. Aortic atherosclerosis. 7. Remaining findings as described above. Electronically Signed   By: Brett Fairy M.D.   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 Faythe Ghee in [facility], he was left open and went back for washout and closure the following day - SBO protocol - patient reports he would only consider surgery as a last resort - large chronic abdominal wall hernias without really any option to reconstruct abdominal wall  ID - none VTE - sq heparin FEN - IVF, NPO/NGT to LIWS Foley - none   Recent TIA vs CVA 10/2021 on plavix (last dose 12/21 in AM) DM HLD OSA Chronic respiratory failure on home O2 Morbid obesity BMI 48.82 SSS s/p PPM CHF CAD with h/o cardiac stents HTN CKD-IV   LOS: 1 day    Georganna Skeans, MD, MPH, FACS Trauma & General Surgery Use [email or web address] to contact on call provider  02/10/2022

## 2022-02-10 NOTE — Progress Notes (Signed)
Subjective: Patient having crampy pain this morning.  Apparently at some point yesterday his NGT came out and was not replaced until overnight at which time he had 3+L out of his NGT.  He still complains of nausea and mostly gas and gas pains.  No output from his colostomy.  Patient has never received gastrografin despite order for SBO protocol yesterday.  ROS: See above, otherwise other systems negative  Objective: Vital signs in last 24 hours: Temp:  [97.8 F (36.6 C)-99.3 F (37.4 C)] 97.9 F (36.6 C) (12/23 0442) Pulse Rate:  [81-92] 87 (12/23 0442) Resp:  [17-20] 19 (12/23 0442) BP: (115-129)/(52-59) 129/59 (12/23 0442) SpO2:  [96 %-100 %] 99 % (12/23 0442)    Intake/Output from previous day: 12/22 0701 - 12/23 0700 In: 362.5 [I.V.:362.5] Out: 3375 [Emesis/NG output:3375] Intake/Output this shift: No intake/output data recorded.  PE: Gen: mild distress secondary mostly to belching and nausea Heart: Regular Lungs: CTAB Abd: soft, multiple large ventral incisional hernias, these are all soft, some upper abdominal discomfort, but no peritonitis or guarding on exam.  Right-sided colostomy in place with no output. NGT in place with light brown feculent appearing output. Psych: A&Ox3  Lab Results:  Recent Labs    02/09/22 0452 02/10/22 0211  WBC 11.9* 5.7  HGB 11.0* 11.6*  HCT 34.4* 35.3*  PLT 249 275   BMET Recent Labs    02/09/22 0452 02/10/22 0211  NA 134* 134*  K 3.5 4.0  CL 94* 97*  CO2 25 23  GLUCOSE 316* 326*  BUN 37* 61*  CREATININE 2.34* 3.18*  CALCIUM 9.3 8.9   PT/INR No results for input(s): "LABPROT", "INR" in the last 72 hours. CMP     Component Value Date/Time   NA 134 (L) 02/10/2022 0211   NA 139 12/29/2021 0000   K 4.0 02/10/2022 0211   CL 97 (L) 02/10/2022 0211   CO2 23 02/10/2022 0211   GLUCOSE 326 (H) 02/10/2022 0211   BUN 61 (H) 02/10/2022 0211   BUN 51 (A) 12/29/2021 0000   CREATININE 3.18 (H) 02/10/2022 0211    CREATININE 2.11 (H) 05/23/2021 1540   CALCIUM 8.9 02/10/2022 0211   PROT 7.5 02/09/2022 0452   ALBUMIN 3.5 02/09/2022 0452   AST 20 02/09/2022 0452   ALT 14 02/09/2022 0452   ALKPHOS 65 02/09/2022 0452   BILITOT 0.8 02/09/2022 0452   GFRNONAA 20 (L) 02/10/2022 0211   GFRAA (L) 04/08/2008 1240    54        The eGFR has been calculated using the MDRD equation. This calculation has not been validated in all clinical situations. eGFR's persistently <60 mL/min signify possible Chronic Kidney Disease.   Lipase     Component Value Date/Time   LIPASE 55 (H) 02/09/2022 0039       Studies/Results: DG Abd Portable 1V  Result Date: 02/09/2022 CLINICAL DATA:  Check gastric catheter placement EXAM: PORTABLE ABDOMEN - 1 VIEW COMPARISON:  None Available. FINDINGS: Gastric catheter is noted with the tip in the stomach although the proximal side port lies in the distal esophagus. This should be advanced deeper into the stomach. No free air is seen. IMPRESSION: Gastric catheter as described which should be advanced several cm deeper into the stomach. Electronically Signed   By: Inez Catalina M.D.   On: 02/09/2022 21:21   DG Abd Portable 1V  Result Date: 02/09/2022 CLINICAL DATA:  Small bowel obstruction EXAM: PORTABLE ABDOMEN - 1 VIEW COMPARISON:  02/09/2022, 9:32 a.m. FINDINGS: Examination is extremely limited by body habitus and severe underpenetration. A previously seen esophagogastric tube has been removed. There remains distended stomach and small bowel throughout the ventral abdomen, not obviously changed compared to prior examination, however assessment is extremely limited. No obvious free air. IMPRESSION: Examination is extremely limited by body habitus and severe underpenetration. A previously seen esophagogastric tube has been removed. There remains distended stomach and small bowel throughout the ventral abdomen, not obviously changed compared to prior examination and concerning for  bowel obstruction, however assessment is extremely limited. Electronically Signed   By: Delanna Ahmadi M.D.   On: 02/09/2022 17:35   DG Chest Port 1 View  Result Date: 02/09/2022 CLINICAL DATA:  Shortness of breath EXAM: PORTABLE CHEST 1 VIEW COMPARISON:  Previous studies including the chest radiograph done on 10/06/2021 FINDINGS: Transverse diameter of heart is increased. There are no signs of alveolar pulmonary edema. There is increased density in left lower lung field obscuring the left hemidiaphragm. Pacemaker battery is seen in the left infraclavicular region. Enteric tube is seen in esophagus. Distal end of enteric tube is not adequately visualized to evaluate its location. IMPRESSION: Cardiomegaly. Increased density in left lower lung fields may suggest left pleural effusion and underlying atelectasis/pneumonia. Tip of enteric tube is not adequately visualized and location of the tip could not be evaluated. Electronically Signed   By: Elmer Picker M.D.   On: 02/09/2022 17:33   DG Abd Portable 1V-Small Bowel Protocol-Position Verification  Result Date: 02/09/2022 CLINICAL DATA:  NG tube placed EXAM: PORTABLE ABDOMEN - 1 VIEW COMPARISON:  CT 02/09/2022 FINDINGS: Nasogastric tube tip overlies the stomach, side port near the GE junction. There is marked stomach dilation. Left pleural effusion and adjacent rounded atelectasis, as seen on recent CT. IMPRESSION: Nasogastric tube tip overlies the stomach, side port near the GE junction. Recommend advancement by 7.0 cm. Electronically Signed   By: Maurine Simmering M.D.   On: 02/09/2022 12:50   CT ABDOMEN PELVIS W CONTRAST  Result Date: 02/09/2022 CLINICAL DATA:  Bowel obstruction suspected, history of multiple surgeries. No osteotomy output in 6 hours. Abdominal distention. EXAM: CT ABDOMEN AND PELVIS WITH CONTRAST TECHNIQUE: Multidetector CT imaging of the abdomen and pelvis was performed using the standard protocol following bolus administration of  intravenous contrast. RADIATION DOSE REDUCTION: This exam was performed according to the departmental dose-optimization program which includes automated exposure control, adjustment of the mA and/or kV according to patient size and/or use of iterative reconstruction technique. CONTRAST:  96m OMNIPAQUE IOHEXOL 350 MG/ML SOLN COMPARISON:  03/06/2015. FINDINGS: Lower chest: The heart is enlarged and there is a small pericardial effusion. Pacemaker leads are noted in the heart. With rounded opacities at the left lung base. Right lung base is clear. Hepatobiliary: The liver has a nodular contour suggesting underlying cirrhosis. The gallbladder is without stones. No biliary ductal dilatation. Pancreas: Fatty atrophy. No pancreatic ductal dilatation or surrounding inflammatory changes. Spleen: Normal in size without focal abnormality. Adrenals/Urinary Tract: No adrenal nodule or mass. There is bilateral renal atrophy with renal cortical thinning hearing. A isoechoic exophytic lesion is present along the posterior aspect of the left kidney measuring 2.5 cm. No renal calculus or hydronephrosis. The bladder is unremarkable. Stomach/Bowel: The distal esophagus is mildly distended with fluid. There is a large complex ventral abdominal wall hernia containing small bowel, stomach, and a portion of the colon in the midline. The hernia pouch is are not well evaluated and incompletely imaged due to  field of view and patient's body habitus. There is dilatation of the proximal small bowel with air-fluid levels measuring up to 4.7 cm to the level of a spigelian hernia on the left. The visualized loops of small bowel in the spigelian hernia pouch are distended measuring 4 cm, however only partially included in the field of view. The remaining loops of small bowel in the abdomen and hernia pouch are decompressed. No free air or pneumatosis. An ostomy is noted in the right lower quadrant. Vascular/Lymphatic: Aortic atherosclerosis. No  enlarged abdominal or pelvic lymph nodes. Reproductive: Prostate is unremarkable. Other: No abdominopelvic ascites. Musculoskeletal: Degenerative changes in the thoracolumbar spine. No acute osseous abnormality. IMPRESSION: 1. Distention of the stomach and proximal small bowel measuring up to 4.7 cm to the level of the left spigelian hernia. The small bowel in the spigelian hernia pouch measures up to 4 cm. The remaining distal small-bowel is decompressed. Findings are concerning for small bowel obstruction, however the hernia pouch is only partially visualized on exam due to patient's body habitus. Surgical consultation is recommended. 2. Large ventral abdominal wall hernia containing a portion of the stomach, small bowel, and colon, only partially visualized on exam. 3. Nodular contour of the liver, suggesting underlying cirrhosis. 4. Exophytic isodense lesion in the posterior aspect of the left kidney which is new from the previous exam. Ultrasound is recommended for further characterization. 5. Small left pleural effusion with rounded opacities at the left lung base, possible atelectasis or pneumonia. The possibility of underlying malignancy can not be completely excluded. 6. Aortic atherosclerosis. 7. Remaining findings as described above. Electronically Signed   By: Brett Fairy M.D.   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 Faythe Ghee in [facility], he was left open and went back for washout and closure the following day   -patient with what sounds mostly like crampy, gas pains this morning, belching constantly.  NGT is in place with appropriate output -AXRAY pending -SBO protocol never completed yesterday for  unclear reasons on day shift -start SBO protocol today to see if we can get the patient better without operative intervention -the patient would like to avoid surgical intervention if we can given the extensive prior surgical history he has and the difficulty that another operation would be.  He is currently hemodynamically stable with a normalized WBC, AF, not tachycardic, and without peritonitis.  No acute surgical indications at this time -will continue to help treat nausea and pain, but monitor closely for more urgent need for surgery.  FEN - NPO/IVFs/NGT VTE - heparin ID - none currently indicated  Recent TIA vs CVA 10/2021 on plavix (last dose 12/21 in AM) DM HLD OSA Chronic respiratory failure on home O2 Morbid obesity BMI 48.82 SSS s/p PPM CHF CAD with h/o cardiac stents HTN CKD-IV - cr up today to 3.18 from 2.34  I reviewed hospitalist notes, last 24 h vitals and pain scores, last 48 h intake and output, last 24 h labs and trends, and last 24 h imaging results.   LOS: 1 day    Henreitta Cea , [facility] 02/10/2022, 7:39 AM Please see Amion for pager number during day hours 7:00am-4:30pm or 7:00am -11:30am on weekends

## 2022-02-10 NOTE — Consult Note (Signed)
[facility]  HISTORY AND PHYSICAL  Jesse [name] is an 71 y.o. male.    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 Candiss Norse for eval and recs re: AKI on CKD.    Pt was admitted 02/09/22 to [facility] for SBO.  He is getting the SBO protocol via gen surg.    On admission Cr was at his baseline of 2.2.  Today is 3.18 with some low UOP, abotu 200 mL in 24 hrs.    In this setting we are asked to see.  Pt just got 500 mL/ bolus of LR.  Is on IVFs of LR with Kcl in them.   Home meds include losartan, Jardiance, lasix.   Foley ordered, has not been placed yet.  Pt is nauseated.  Tube clamped for SBFT exam, gastrografin was given.   PMH: Past Medical History:  Diagnosis Date   Alcohol use disorder in remission    Anxiety and depression    At high risk for falls    Former cigarette smoker    Generalized anxiety disorder    Hard of hearing    Hyperlipidemia due to type 2 diabetes mellitus ([place])    Hypertension    Morbid obesity ([place])    Neuropathy    O2 dependent    Risk for falls    Sleep apnea    Type 2 diabetes mellitus ([place])    PSH: Past Surgical History:  Procedure Laterality Date   APPENDECTOMY  1980   IR THORACENTESIS ASP PLEURAL SPACE W/IMG GUIDE  05/18/2021   IR THORACENTESIS ASP PLEURAL SPACE W/IMG GUIDE  10/06/2021   KNEE SURGERY Right    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   Procedure: PACEMAKER IMPLANT;  Surgeon: Deboraha Sprang, MD;  Location: [facility];  Service: Cardiovascular;  Laterality: N/A;     Past Medical History:  Diagnosis Date   Alcohol use disorder in remission    Anxiety and depression    At high risk for falls    Former cigarette smoker    Generalized anxiety disorder    Hard of hearing    Hyperlipidemia due to type 2 diabetes mellitus  (HCC)    Hypertension    Morbid obesity (H[facility])    Neuropathy    O2 dependent    Risk for falls    Sleep apnea    Type 2 diabetes mellitus (HCC)     Medications:  Scheduled:  alfuzosin  10 mg Oral Q breakfast   cloNIDine  0.2 mg Transdermal Weekly   heparin  5,000 Units Subcutaneous Q8H   insulin aspart  0-15 Units Subcutaneous Q4H   insulin glargine-yfgn  25 Units Subcutaneous Daily   isosorbide mononitrate  60 mg Oral Daily   pantoprazole (PROTONIX) IV  40 mg Intravenous Q24H   rosuvastatin  10 mg Oral Daily   topiramate  25 mg Oral Daily    Medications Prior to Admission  Medication Sig Dispense Refill   alfuzosin (UROXATRAL) 10 MG 24 hr tablet TAKE 1 TABLET BY MOUTH EVERY DAY (Patient taking differently: Take 10 mg by mouth daily with breakfast.) 30 tablet 0   ALPRAZolam (XANAX) 0.25 MG tablet Take 2 tablets (0.5 mg total) by mouth 2 (two) times daily as needed for anxiety  or sleep. 60 tablet 0   calcium carbonate (TUMS - DOSED IN MG ELEMENTAL CALCIUM) 500 MG chewable tablet Chew 1 tablet by mouth 2 (two) times daily.     Carboxymethylcellulose Sodium (DRY EYE RELIEF OP) Place 1 drop into both eyes daily as needed (dry eyes).     Cholecalciferol (VITAMIN D3) 50 MCG (2000 UT) capsule Take 0.5 capsules (1,000 Units total) by mouth daily. 30 capsule 3   clopidogrel (PLAVIX) 75 MG tablet Take 1 tablet (75 mg total) by mouth daily. 30 tablet 3   cyanocobalamin 1000 MCG tablet Take 1,000 mcg by mouth daily.     DULoxetine (CYMBALTA) 60 MG capsule Take 1 capsule (60 mg total) by mouth daily. 90 capsule 1   empagliflozin (JARDIANCE) 25 MG TABS tablet TAKE 1 TABLET BY MOUTH EVERY DAY (Patient taking differently: Take 25 mg by mouth daily.) 90 tablet 2   FEROSUL 325 (65 Fe) MG tablet TAKE 1 TABLET(325 MG) BY MOUTH DAILY WITH BREAKFAST (Patient taking differently: Take 325 mg by mouth daily with breakfast.) 30 tablet 5   furosemide (LASIX) 40 MG tablet Take 80 mg by mouth in the morning and  at bedtime.     insulin detemir (LEVEMIR FLEXPEN) 100 UNIT/ML FlexPen Inject 42 Units into the skin at bedtime.     isosorbide mononitrate (IMDUR) 60 MG 24 hr tablet Take 1 tablet (60 mg total) by mouth daily. 90 tablet 1   ketoconazole (NIZORAL) 2 % cream Apply 1 application topically as needed for irritation. 15 g 5   Loperamide HCl (IMODIUM PO) Take 6 mg by mouth 3 (three) times daily with meals.     losartan (COZAAR) 25 MG tablet Take 25 mg by mouth daily.     NYSTATIN powder Apply 1 application  topically daily as needed (Yeast infection to afffected area).     OVER THE COUNTER MEDICATION Apply 1 Application topically every other day. Head and shoulder medicated shampoo alternate with selsun blue medicated     pantoprazole (PROTONIX) 40 MG tablet Take 1 tablet (40 mg total) by mouth daily. 30 tablet 3   potassium chloride SA (KLOR-CON M) 20 MEQ tablet Take 20 mEq by mouth 2 (two) times daily.     Red Yeast Rice 600 MG CAPS Take 1,200 mg by mouth daily.     rosuvastatin (CRESTOR) 10 MG tablet TAKE 1 TABLET BY MOUTH EVERY DAY 90 tablet 1   simethicone (MYLICON) 80 MG chewable tablet Chew 80 mg by mouth every 6 (six) hours as needed for flatulence.     topiramate (TOPAMAX) 25 MG tablet TAKE 1 TABLET BY MOUTH EVERY DAY (Patient taking differently: Take 25 mg by mouth daily.) 90 tablet 2   traMADol (ULTRAM) 50 MG tablet TAKE 1 TABLET(50 MG) BY MOUTH TWICE DAILY AS NEEDED (Patient taking differently: Take 50 mg by mouth 2 (two) times daily as needed for moderate pain.) 60 tablet 3   traZODone (DESYREL) 50 MG tablet TAKE 2 TABLETS(100 MG) BY MOUTH AT BEDTIME 60 tablet 3   triamcinolone cream (KENALOG) 0.1 % Apply 1 application. topically 2 (two) times daily. Apply to affected areas on shin area (Patient taking differently: Apply 1 application  topically 2 (two) times daily as needed. Apply to affected areas on shin area) 45 g 0   TURMERIC PO Take 1,000 mg by mouth daily.     VICTOZA 18 MG/3ML SOPN  ADMINISTER 1.8 MG UNDER THE SKIN EVERY DAY (Patient taking differently: Inject 1.8 mg into the  skin daily.) 27 mL 5   glucose blood test strip Use to test blood sugar once daily. Dx:E11.22 100 each 3   Insulin Pen Needle 32G X 4 MM MISC Use to inject insulin twice daily. Dx: E11.22 200 each 3   Lancet Device MISC 1 Device by Does not apply route daily. 1 each 11   Lancets MISC 1 Device by Does not apply route as directed. Use as directed. 100 each 2   OXYGEN Inhale 2 L into the lungs continuous.      ALLERGIES:   Allergies  Allergen Reactions   Tape Rash    If left on for a long time    FAM HX: Family History  Problem Relation Age of Onset   Diabetes Brother    Bipolar disorder Daughter    Anxiety disorder Son     Social History:   reports that he quit smoking about 24 years ago. His smoking use included cigarettes. He smoked an average of 1 pack per day. He has never used smokeless tobacco. He reports that he does not currently use alcohol. He reports that he does not use drugs.  ROS: ROS: all other systems reviewed except as per HPI  Blood pressure (!) 129/59, pulse 87, temperature 97.9 F (36.6 C), resp. rate 19, height 6' (1.829 m), weight (!) 163.3 kg, SpO2 99 %. PHYSICAL EXAM: Physical Exam GEN NAD, sitting in bed, appears miserable HEENT EOMI PERRL, + NGT with bilious output NECK no JVD PULM clear anteriorly CV RRR ABD + multiple abd wall hernias, very few bowel sounds, can see occasional peristalsis through hernia sacs.  + ostomy with little output EXT trace LE edema NEURO AAO x 3  Results for orders placed or performed during the hospital encounter of 02/08/22 (from the past 48 hour(s))  CBC with Differential     Status: Abnormal   Collection Time: 02/09/22 12:39 AM  Result Value Ref Range   WBC 10.5 4.0 - 10.5 K/uL   RBC 3.70 (L) 4.22 - 5.81 MIL/uL   Hemoglobin 10.5 (L) 13.0 - 17.0 g/dL   HCT 33.6 (L) 39.0 - 52.0 %   MCV 90.8 80.0 - 100.0 fL   MCH 28.4  26.0 - 34.0 pg   MCHC 31.3 30.0 - 36.0 g/dL   RDW 15.3 11.5 - 15.5 %   Platelets 245 150 - 400 K/uL   nRBC 0.0 0.0 - 0.2 %   Neutrophils Relative % 81 %   Neutro Abs 8.5 (H) 1.7 - 7.7 K/uL   Lymphocytes Relative 8 %   Lymphs Abs 0.8 0.7 - 4.0 K/uL   Monocytes Relative 9 %   Monocytes Absolute 1.0 0.1 - 1.0 K/uL   Eosinophils Relative 1 %   Eosinophils Absolute 0.1 0.0 - 0.5 K/uL   Basophils Relative 0 %   Basophils Absolute 0.0 0.0 - 0.1 K/uL   Immature Granulocytes 1 %   Abs Immature Granulocytes 0.05 0.00 - 0.07 K/uL    Comment: Performed at [facility], [address] [place], [place] [address]  Comprehensive metabolic panel     Status: Abnormal   Collection Time: 02/09/22 12:39 AM  Result Value Ref Range   Sodium 135 135 - 145 mmol/L   Potassium 3.4 (L) 3.5 - 5.1 mmol/L   Chloride 96 (L) 98 - 111 mmol/L   CO2 26 22 - 32 mmol/L   Glucose, Bld 270 (H) 70 - 99 mg/dL    Comment: Glucose reference range applies  only to samples taken after fasting for at least 8 hours.   BUN 34 (H) 8 - 23 mg/dL   Creatinine, Ser 2.22 (H) 0.61 - 1.24 mg/dL   Calcium 9.1 8.9 - 10.3 mg/dL   Total Protein 7.2 6.5 - 8.1 g/dL   Albumin 3.5 3.5 - 5.0 g/dL   AST 20 15 - 41 U/L   ALT 15 0 - 44 U/L   Alkaline Phosphatase 59 38 - 126 U/L   Total Bilirubin 0.7 0.3 - 1.2 mg/dL   GFR, Estimated 31 (L) >60 mL/min    Comment: (NOTE) Calculated using the CKD-EPI Creatinine Equation (2021)    Anion gap 13 5 - 15    Comment: Performed at [facility] [address] [place], [place] [address]  Lipase, blood     Status: Abnormal   Collection Time: 02/09/22 12:39 AM  Result Value Ref Range   Lipase 55 (H) 11 - 51 U/L    Comment: Performed at [facility], [place] [address] [place], [place] [address]  Comprehensive metabolic panel     Status: Abnormal   Collection Time: 02/09/22  4:52 AM  Result Value Ref Range   Sodium 134 (L) 135 - 145 mmol/L   Potassium 3.5 3.5 - 5.1 mmol/L    Chloride 94 (L) 98 - 111 mmol/L   CO2 25 22 - 32 mmol/L   Glucose, Bld 316 (H) 70 - 99 mg/dL    Comment: Glucose reference range applies only to samples taken after fasting for at least 8 hours.   BUN 37 (H) 8 - 23 mg/dL   Creatinine, Ser 2.34 (H) 0.61 - 1.24 mg/dL   Calcium 9.3 8.9 - 10.3 mg/dL   Total Protein 7.5 6.5 - 8.1 g/dL   Albumin 3.5 3.5 - 5.0 g/dL   AST 20 15 - 41 U/L   ALT 14 0 - 44 U/L   Alkaline Phosphatase 65 38 - 126 U/L   Total Bilirubin 0.8 0.3 - 1.2 mg/dL   GFR, Estimated 29 (L) >60 mL/min    Comment: (NOTE) Calculated using the CKD-EPI Creatinine Equation (2021)    Anion gap 15 5 - 15    Comment: Performed at [facility] [address], [place], [place] [address]  CBC with Differential/Platelet     Status: Abnormal   Collection Time: 02/09/22  4:52 AM  Result Value Ref Range   WBC 11.9 (H) 4.0 - 10.5 K/uL   RBC 3.82 (L) 4.22 - 5.81 MIL/uL   Hemoglobin 11.0 (L) 13.0 - 17.0 g/dL   HCT 34.4 (L) 39.0 - 52.0 %   MCV 90.1 80.0 - 100.0 fL   MCH 28.8 26.0 - 34.0 pg   MCHC 32.0 30.0 - 36.0 g/dL   RDW 15.5 11.5 - 15.5 %   Platelets 249 150 - 400 K/uL   nRBC 0.0 0.0 - 0.2 %   Neutrophils Relative % 91 %   Neutro Abs 10.7 (H) 1.7 - 7.7 K/uL   Lymphocytes Relative 3 %   Lymphs Abs 0.4 (L) 0.7 - 4.0 K/uL   Monocytes Relative 6 %   Monocytes Absolute 0.7 0.1 - 1.0 K/uL   Eosinophils Relative 0 %   Eosinophils Absolute 0.0 0.0 - 0.5 K/uL   Basophils Relative 0 %   Basophils Absolute 0.0 0.0 - 0.1 K/uL   Immature Granulocytes 0 %   Abs Immature Granulocytes 0.05 0.00 - 0.07 K/uL    Comment: Performed at  [facility], [place] [address], [place], [place] [address]  Magnesium     Status: Abnormal   Collection Time: 02/09/22  4:52 AM  Result Value Ref Range   Magnesium 2.7 (H) 1.7 - 2.4 mg/dL    Comment: Performed at [facility] [address] [place], [place] [address]  Brain natriuretic peptide     Status: None   Collection Time: 02/09/22  4:52  AM  Result Value Ref Range   B Natriuretic Peptide 48.0 0.0 - 100.0 pg/mL    Comment: Performed at [facility] [address], [place], [place] [address]  Urinalysis, Routine w reflex microscopic     Status: Abnormal   Collection Time: 02/09/22  6:03 AM  Result Value Ref Range   Color, Urine YELLOW YELLOW   APPearance CLEAR CLEAR   Specific Gravity, Urine 1.024 1.005 - 1.030   pH 5.0 5.0 - 8.0   Glucose, UA 150 (A) NEGATIVE mg/dL   Hgb urine dipstick NEGATIVE NEGATIVE   Bilirubin Urine NEGATIVE NEGATIVE   Ketones, ur NEGATIVE NEGATIVE mg/dL   Protein, ur NEGATIVE NEGATIVE mg/dL   Nitrite NEGATIVE NEGATIVE   Leukocytes,Ua NEGATIVE NEGATIVE    Comment: Performed at [facility] [address], [place], [place] [address]  CBG monitoring, ED     Status: Abnormal   Collection Time: 02/09/22  6:07 AM  Result Value Ref Range   Glucose-Capillary 323 (H) 70 - 99 mg/dL    Comment: Glucose reference range applies only to samples taken after fasting for at least 8 hours.  Glucose, capillary     Status: Abnormal   Collection Time: 02/09/22  7:47 AM  Result Value Ref Range   Glucose-Capillary 318 (H) 70 - 99 mg/dL    Comment: Glucose reference range applies only to samples taken after fasting for at least 8 hours.  Glucose, capillary     Status: Abnormal   Collection Time: 02/09/22 11:39 AM  Result Value Ref Range   Glucose-Capillary 247 (H) 70 - 99 mg/dL    Comment: Glucose reference range applies only to samples taken after fasting for at least 8 hours.  Glucose, capillary     Status: Abnormal   Collection Time: 02/09/22  4:09 PM  Result Value Ref Range   Glucose-Capillary 252 (H) 70 - 99 mg/dL    Comment: Glucose reference range applies only to samples taken after fasting for at least 8 hours.  Glucose, capillary     Status: Abnormal   Collection Time: 02/09/22  7:34 PM  Result Value Ref Range   Glucose-Capillary 290 (H) 70 - 99 mg/dL    Comment: Glucose reference range  applies only to samples taken after fasting for at least 8 hours.  Glucose, capillary     Status: Abnormal   Collection Time: 02/09/22 11:32 PM  Result Value Ref Range   Glucose-Capillary 339 (H) 70 - 99 mg/dL    Comment: Glucose reference range applies only to samples taken after fasting for at least 8 hours.  Uric acid     Status: Abnormal   Collection Time: 02/10/22  2:05 AM  Result Value Ref Range   Uric Acid, Serum 12.3 (H) 3.7 - 8.6 mg/dL    Comment: Performed at [facility] [address] [place], [place] [address]  CBC with Differential/Platelet     Status: Abnormal   Collection Time: 02/10/22  2:11 AM  Result Value Ref Range   WBC 5.7  4.0 - 10.5 K/uL   RBC 4.02 (L) 4.22 - 5.81 MIL/uL   Hemoglobin 11.6 (L) 13.0 - 17.0 g/dL   HCT 35.3 (L) 39.0 - 52.0 %   MCV 87.8 80.0 - 100.0 fL   MCH 28.9 26.0 - 34.0 pg   MCHC 32.9 30.0 - 36.0 g/dL   RDW 15.7 (H) 11.5 - 15.5 %   Platelets 275 150 - 400 K/uL   nRBC 0.0 0.0 - 0.2 %   Neutrophils Relative % 72 %   Neutro Abs 4.0 1.7 - 7.7 K/uL   Lymphocytes Relative 8 %   Lymphs Abs 0.5 (L) 0.7 - 4.0 K/uL   Monocytes Relative 20 %   Monocytes Absolute 1.2 (H) 0.1 - 1.0 K/uL   Eosinophils Relative 0 %   Eosinophils Absolute 0.0 0.0 - 0.5 K/uL   Basophils Relative 0 %   Basophils Absolute 0.0 0.0 - 0.1 K/uL   Immature Granulocytes 0 %   Abs Immature Granulocytes 0.02 0.00 - 0.07 K/uL    Comment: Performed at [facility], [address] [place], [place] [address]  Brain natriuretic peptide     Status: None   Collection Time: 02/10/22  2:11 AM  Result Value Ref Range   B Natriuretic Peptide 82.5 0.0 - 100.0 pg/mL    Comment: Performed at [facility] [address] [place], [place] [address]  Basic metabolic panel     Status: Abnormal   Collection Time: 02/10/22  2:11 AM  Result Value Ref Range   Sodium 134 (L) 135 - 145 mmol/L   Potassium 4.0 3.5 - 5.1 mmol/L   Chloride 97 (L) 98 - 111 mmol/L   CO2 23 22 - 32  mmol/L   Glucose, Bld 326 (H) 70 - 99 mg/dL    Comment: Glucose reference range applies only to samples taken after fasting for at least 8 hours.   BUN 61 (H) 8 - 23 mg/dL   Creatinine, Ser 3.18 (H) 0.61 - 1.24 mg/dL   Calcium 8.9 8.9 - 10.3 mg/dL   GFR, Estimated 20 (L) >60 mL/min    Comment: (NOTE) Calculated using the CKD-EPI Creatinine Equation (2021)    Anion gap 14 5 - 15    Comment: Performed at [facility] [address], [place], [place] [address]  Magnesium     Status: Abnormal   Collection Time: 02/10/22  2:11 AM  Result Value Ref Range   Magnesium 2.8 (H) 1.7 - 2.4 mg/dL    Comment: Performed at [facility] [address] [place], [place] [address]  Glucose, capillary     Status: Abnormal   Collection Time: 02/10/22  4:40 AM  Result Value Ref Range   Glucose-Capillary 275 (H) 70 - 99 mg/dL    Comment: Glucose reference range applies only to samples taken after fasting for at least 8 hours.  Creatinine, urine, random     Status: None   Collection Time: 02/10/22  6:03 AM  Result Value Ref Range   Creatinine, Urine 117 mg/dL    Comment: Performed at [facility] [address], [place], [place] [address]  Sodium, urine, random     Status: None   Collection Time: 02/10/22  6:03 AM  Result Value Ref Range   Sodium, Ur <10 mmol/L    Comment: Performed at [facility] [address] [place], [place] [address]  Glucose, capillary     Status: Abnormal  Collection Time: 02/10/22  7:48 AM  Result Value Ref Range   Glucose-Capillary 216 (H) 70 - 99 mg/dL    Comment: Glucose reference range applies only to samples taken after fasting for at least 8 hours.  Glucose, capillary     Status: Abnormal   Collection Time: 02/10/22 11:54 AM  Result Value Ref Range   Glucose-Capillary 253 (H) 70 - 99 mg/dL    Comment: Glucose reference range applies only to samples taken after fasting for at least 8 hours.    DG Abd Portable 1V  Result Date:  02/10/2022 CLINICAL DATA:  Small-bowel obstruction EXAM: PORTABLE ABDOMEN - 1 VIEW COMPARISON:  Radiograph 02/09/2022 FINDINGS: Nasogastric tube tip and side port overlie the stomach. There is persistent gaseous distension and small bowel dilation. IMPRESSION: Nasogastric tube tip and side port overlie the stomach, which remains distended, correlate with output. Persistent bowel obstruction. Electronically Signed   By: Maurine Simmering M.D.   On: 02/10/2022 08:51   DG Abd Portable 1V  Result Date: 02/09/2022 CLINICAL DATA:  Check gastric catheter placement EXAM: PORTABLE ABDOMEN - 1 VIEW COMPARISON:  None Available. FINDINGS: Gastric catheter is noted with the tip in the stomach although the proximal side port lies in the distal esophagus. This should be advanced deeper into the stomach. No free air is seen. IMPRESSION: Gastric catheter as described which should be advanced several cm deeper into the stomach. Electronically Signed   By: Inez Catalina M.D.   On: 02/09/2022 21:21   DG Abd Portable 1V  Result Date: 02/09/2022 CLINICAL DATA:  Small bowel obstruction EXAM: PORTABLE ABDOMEN - 1 VIEW COMPARISON:  02/09/2022, 9:32 a.m. FINDINGS: Examination is extremely limited by body habitus and severe underpenetration. A previously seen esophagogastric tube has been removed. There remains distended stomach and small bowel throughout the ventral abdomen, not obviously changed compared to prior examination, however assessment is extremely limited. No obvious free air. IMPRESSION: Examination is extremely limited by body habitus and severe underpenetration. A previously seen esophagogastric tube has been removed. There remains distended stomach and small bowel throughout the ventral abdomen, not obviously changed compared to prior examination and concerning for bowel obstruction, however assessment is extremely limited. Electronically Signed   By: Delanna Ahmadi M.D.   On: 02/09/2022 17:35   DG Chest Port 1  View  Result Date: 02/09/2022 CLINICAL DATA:  Shortness of breath EXAM: PORTABLE CHEST 1 VIEW COMPARISON:  Previous studies including the chest radiograph done on 10/06/2021 FINDINGS: Transverse diameter of heart is increased. There are no signs of alveolar pulmonary edema. There is increased density in left lower lung field obscuring the left hemidiaphragm. Pacemaker battery is seen in the left infraclavicular region. Enteric tube is seen in esophagus. Distal end of enteric tube is not adequately visualized to evaluate its location. IMPRESSION: Cardiomegaly. Increased density in left lower lung fields may suggest left pleural effusion and underlying atelectasis/pneumonia. Tip of enteric tube is not adequately visualized and location of the tip could not be evaluated. Electronically Signed   By: Elmer Picker M.D.   On: 02/09/2022 17:33   DG Abd Portable 1V-Small Bowel Protocol-Position Verification  Result Date: 02/09/2022 CLINICAL DATA:  NG tube placed EXAM: PORTABLE ABDOMEN - 1 VIEW COMPARISON:  CT 02/09/2022 FINDINGS: Nasogastric tube tip overlies the stomach, side port near the GE junction. There is marked stomach dilation. Left pleural effusion and adjacent rounded atelectasis, as seen on recent CT. IMPRESSION: Nasogastric tube tip overlies the stomach, side port near the GE  junction. Recommend advancement by 7.0 cm. Electronically Signed   By: Maurine Simmering M.D.   On: 02/09/2022 12:50   CT ABDOMEN PELVIS W CONTRAST  Result Date: 02/09/2022 CLINICAL DATA:  Bowel obstruction suspected, history of multiple surgeries. No osteotomy output in 6 hours. Abdominal distention. EXAM: CT ABDOMEN AND PELVIS WITH CONTRAST TECHNIQUE: Multidetector CT imaging of the abdomen and pelvis was performed using the standard protocol following bolus administration of intravenous contrast. RADIATION DOSE REDUCTION: This exam was performed according to the departmental dose-optimization program which includes  automated exposure control, adjustment of the mA and/or kV according to patient size and/or use of iterative reconstruction technique. CONTRAST:  67mL OMNIPAQUE IOHEXOL 350 MG/ML SOLN COMPARISON:  03/06/2015. FINDINGS: Lower chest: The heart is enlarged and there is a small pericardial effusion. Pacemaker leads are noted in the heart. With rounded opacities at the left lung base. Right lung base is clear. Hepatobiliary: The liver has a nodular contour suggesting underlying cirrhosis. The gallbladder is without stones. No biliary ductal dilatation. Pancreas: Fatty atrophy. No pancreatic ductal dilatation or surrounding inflammatory changes. Spleen: Normal in size without focal abnormality. Adrenals/Urinary Tract: No adrenal nodule or mass. There is bilateral renal atrophy with renal cortical thinning hearing. A isoechoic exophytic lesion is present along the posterior aspect of the left kidney measuring 2.5 cm. No renal calculus or hydronephrosis. The bladder is unremarkable. Stomach/Bowel: The distal esophagus is mildly distended with fluid. There is a large complex ventral abdominal wall hernia containing small bowel, stomach, and a portion of the colon in the midline. The hernia pouch is are not well evaluated and incompletely imaged due to field of view and patient's body habitus. There is dilatation of the proximal small bowel with air-fluid levels measuring up to 4.7 cm to the level of a spigelian hernia on the left. The visualized loops of small bowel in the spigelian hernia pouch are distended measuring 4 cm, however only partially included in the field of view. The remaining loops of small bowel in the abdomen and hernia pouch are decompressed. No free air or pneumatosis. An ostomy is noted in the right lower quadrant. Vascular/Lymphatic: Aortic atherosclerosis. No enlarged abdominal or pelvic lymph nodes. Reproductive: Prostate is unremarkable. Other: No abdominopelvic ascites. Musculoskeletal: Degenerative  changes in the thoracolumbar spine. No acute osseous abnormality. IMPRESSION: 1. Distention of the stomach and proximal small bowel measuring up to 4.7 cm to the level of the left spigelian hernia. The small bowel in the spigelian hernia pouch measures up to 4 cm. The remaining distal small-bowel is decompressed. Findings are concerning for small bowel obstruction, however the hernia pouch is only partially visualized on exam due to patient's body habitus. Surgical consultation is recommended. 2. Large ventral abdominal wall hernia containing a portion of the stomach, small bowel, and colon, only partially visualized on exam. 3. Nodular contour of the liver, suggesting underlying cirrhosis. 4. Exophytic isodense lesion in the posterior aspect of the left kidney which is new from the previous exam. Ultrasound is recommended for further characterization. 5. Small left pleural effusion with rounded opacities at the left lung base, possible atelectasis or pneumonia. The possibility of underlying malignancy can not be completely excluded. 6. Aortic atherosclerosis. 7. Remaining findings as described above. Electronically Signed   By: Brett Fairy M.D.   On: 02/09/2022 03:26    Assessment/Plan  AKI on CKD 3b: likely in setting of hypovolemic injury with SBO.  Also yhad contrast 02/09/22  - on IVFs, agree  - PM  RFP since there's K in the fluids  - agree with Foley  - defer renal US and bladder scans d/t likelihood of very difficult imaging  - no hard indication for RRT at present but will continue to follow closely  2.  SBO:   - has a large complicated ventral hernia  - NGT  - surgery following  3.  CHF:  - IVFs  - holding Lasix/ losartan/ jardiance d/t AKI   4.  DM II  - per primary  5.  HTN  - still on clonidine patch, monitor closely, I think this is fine for now  6.  Dispo: admitted  Madelon Lips 02/10/2022, 1:01 PM

## 2022-02-11 ENCOUNTER — Inpatient Hospital Stay (HOSPITAL_COMMUNITY): Payer: Medicare PPO

## 2022-02-11 DIAGNOSIS — K56609 Unspecified intestinal obstruction, unspecified as to partial versus complete obstruction: Secondary | ICD-10-CM | POA: Diagnosis not present

## 2022-02-11 LAB — GLUCOSE, CAPILLARY
Glucose-Capillary: 166 mg/dL — ABNORMAL HIGH (ref 70–99)
Glucose-Capillary: 176 mg/dL — ABNORMAL HIGH (ref 70–99)
Glucose-Capillary: 183 mg/dL — ABNORMAL HIGH (ref 70–99)
Glucose-Capillary: 201 mg/dL — ABNORMAL HIGH (ref 70–99)
Glucose-Capillary: 226 mg/dL — ABNORMAL HIGH (ref 70–99)
Glucose-Capillary: 252 mg/dL — ABNORMAL HIGH (ref 70–99)

## 2022-02-11 LAB — CBC WITH DIFFERENTIAL/PLATELET
Abs Immature Granulocytes: 0.04 10*3/uL (ref 0.00–0.07)
Basophils Absolute: 0 10*3/uL (ref 0.0–0.1)
Basophils Relative: 0 %
Eosinophils Absolute: 0 10*3/uL (ref 0.0–0.5)
Eosinophils Relative: 0 %
HCT: 37.4 % — ABNORMAL LOW (ref 39.0–52.0)
Hemoglobin: 12.2 g/dL — ABNORMAL LOW (ref 13.0–17.0)
Immature Granulocytes: 1 %
Lymphocytes Relative: 8 %
Lymphs Abs: 0.7 10*3/uL (ref 0.7–4.0)
MCH: 28.9 pg (ref 26.0–34.0)
MCHC: 32.6 g/dL (ref 30.0–36.0)
MCV: 88.6 fL (ref 80.0–100.0)
Monocytes Absolute: 1.9 10*3/uL — ABNORMAL HIGH (ref 0.1–1.0)
Monocytes Relative: 21 %
Neutro Abs: 6.1 10*3/uL (ref 1.7–7.7)
Neutrophils Relative %: 70 %
Platelets: 308 10*3/uL (ref 150–400)
RBC: 4.22 MIL/uL (ref 4.22–5.81)
RDW: 16 % — ABNORMAL HIGH (ref 11.5–15.5)
WBC: 8.6 10*3/uL (ref 4.0–10.5)
nRBC: 0 % (ref 0.0–0.2)

## 2022-02-11 LAB — BASIC METABOLIC PANEL
Anion gap: 18 — ABNORMAL HIGH (ref 5–15)
BUN: 86 mg/dL — ABNORMAL HIGH (ref 8–23)
CO2: 19 mmol/L — ABNORMAL LOW (ref 22–32)
Calcium: 9 mg/dL (ref 8.9–10.3)
Chloride: 101 mmol/L (ref 98–111)
Creatinine, Ser: 4.48 mg/dL — ABNORMAL HIGH (ref 0.61–1.24)
GFR, Estimated: 13 mL/min — ABNORMAL LOW (ref >60)
Glucose, Bld: 175 mg/dL — ABNORMAL HIGH (ref 70–99)
Potassium: 3.9 mmol/L (ref 3.5–5.1)
Sodium: 138 mmol/L (ref 135–145)

## 2022-02-11 LAB — MAGNESIUM: Magnesium: 2.9 mg/dL — ABNORMAL HIGH (ref 1.7–2.4)

## 2022-02-11 LAB — BRAIN NATRIURETIC PEPTIDE: B Natriuretic Peptide: 55.5 pg/mL (ref 0.0–100.0)

## 2022-02-11 MED ORDER — CYCLOBENZAPRINE HCL 5 MG PO TABS
7.5000 mg | ORAL_TABLET | Freq: Three times a day (TID) | ORAL | Status: DC | PRN
  Administered 2022-02-11 – 2022-02-13 (×5): 7.5 mg via ORAL
  Filled 2022-02-11: qty 2
  Filled 2022-02-11: qty 1.5
  Filled 2022-02-11 (×3): qty 2

## 2022-02-11 MED ORDER — LACTATED RINGERS IV BOLUS
1000.0000 mL | Freq: Once | INTRAVENOUS | Status: AC
  Administered 2022-02-11: 1000 mL via INTRAVENOUS

## 2022-02-11 MED ORDER — POTASSIUM CHLORIDE 2 MEQ/ML IV SOLN
INTRAVENOUS | Status: DC
  Filled 2022-02-11 (×2): qty 1000

## 2022-02-11 NOTE — Progress Notes (Signed)
Night coverage note  Notified by RN that patient is depressed and has told family that he wants to die.  I have ordered suicide precautions and placed psychiatry consult.

## 2022-02-11 NOTE — Progress Notes (Signed)
Pt. Has had decrease c/o abd. Pain but increase pain to cervical area. States being in bed and not in his eectric chair has increased the discomfort. Pt.had frequent request for Dilaudid which he stated did not last long. This RN fees pt.would benefit in a muscle relaxant for his cervica pain. Pt.also agrees. Ice pack applied to ease with discomfort.

## 2022-02-11 NOTE — Progress Notes (Signed)
[facility] Progress Note   Assessment/ Plan:    AKI on CKD 3b: likely in setting of hypovolemic injury with SBO.  Also yhad contrast 02/09/22             - on IVFs, agree, increase LR MIVFs to 150 mL/ hr, bolusing prn as well             - agree with Foley             - defer renal US and bladder scans d/t likelihood of very difficult imaging             - no hard indication for RRT at present but will continue to follow closely--> rate of rise decreasing and now making urine so hope we can slide by without it   2.  SBO:              - has a large complicated ventral hernia             - NGT             - surgery following   3.  CHF:             - IVFs             - holding Lasix/ losartan/ jardiance d/t AKI    4.  DM II             - per primary   5.  HTN             - still on clonidine patch, monitor closely, I think this is fine for now   6.  Dispo: admitted  Subjective:    Foley inserted yesterday, making some urine now.  Rate of rise appears to be decreasing.  Nearly 7L gastric output.   Objective:   BP 119/63 (BP Location: Left Arm)   Pulse 81   Temp 98.3 F (36.8 C)   Resp 17   Ht 6' (1.829 m)   Wt (!) 163.3 kg   SpO2 99%   BMI 48.82 kg/m   Intake/Output Summary (Last 24 hours) at 02/11/2022 1238 Last data filed at 02/11/2022 1022 Gross per 24 hour  Intake --  Output 7595 ml  Net -7595 ml   Weight change:   Physical Exam: GEN NAD, sitting in bed, appears miserable HEENT EOMI PERRL, + NGT with bilious output NECK no JVD PULM clear anteriorly CV RRR ABD + multiple abd wall hernias, very few bowel sounds, can see occasional peristalsis through hernia sacs.  + ostomy with more output EXT trace LE edema NEURO AAO x 3  Imaging: DG Abd Portable 1V  Result Date: 02/11/2022 CLINICAL DATA:  Small bowel obstruction EXAM: PORTABLE ABDOMEN - 1 VIEW COMPARISON:  Radiograph from yesterday and abdominal CT from 2 days prior FINDINGS: Gas  distended stomach despite an enteric tube, appearance unchanged from initial imaging this admission. No gas dilated small bowel loops are seen but these loops are mainly fluid-filled on prior CT. Left pleural effusion that is loculated by CT. IMPRESSION: 1. Continued gaseous distension of the stomach despite enteric tube, is the tube functioning? 2. Small bowel obstruction with mainly fluid-filled loops by CT, no interval change. Electronically Signed   By: Jorje Guild M.D.   On: 02/11/2022 06:45   DG Abd Portable 1V-Small Bowel Obstruction Protocol-initial, 8 hr delay  Result Date: 02/10/2022 CLINICAL DATA:  Small bowel obstruction EXAM: PORTABLE ABDOMEN - 1 VIEW  COMPARISON:  None Available. FINDINGS: NG tube tip has been retracted with tip in the proximal stomach and side port near the GE junction. Gaseous distention of the stomach and multiple dilated loops of small bowel, similar to prior exam. No radio-opaque calculi or other significant radiographic abnormality are seen. IMPRESSION: 1. NG tube tip has been retracted with tip in the proximal stomach and side port near the GE junction, recommend advancement. 2. Persistent small bowel obstruction. Electronically Signed   By: Yetta Glassman M.D.   On: 02/10/2022 20:02   DG Abd Portable 1V  Result Date: 02/10/2022 CLINICAL DATA:  Small-bowel obstruction EXAM: PORTABLE ABDOMEN - 1 VIEW COMPARISON:  Radiograph 02/09/2022 FINDINGS: Nasogastric tube tip and side port overlie the stomach. There is persistent gaseous distension and small bowel dilation. IMPRESSION: Nasogastric tube tip and side port overlie the stomach, which remains distended, correlate with output. Persistent bowel obstruction. Electronically Signed   By: Maurine Simmering M.D.   On: 02/10/2022 08:51   DG Abd Portable 1V  Result Date: 02/09/2022 CLINICAL DATA:  Check gastric catheter placement EXAM: PORTABLE ABDOMEN - 1 VIEW COMPARISON:  None Available. FINDINGS: Gastric catheter is  noted with the tip in the stomach although the proximal side port lies in the distal esophagus. This should be advanced deeper into the stomach. No free air is seen. IMPRESSION: Gastric catheter as described which should be advanced several cm deeper into the stomach. Electronically Signed   By: Inez Catalina M.D.   On: 02/09/2022 21:21   DG Abd Portable 1V  Result Date: 02/09/2022 CLINICAL DATA:  Small bowel obstruction EXAM: PORTABLE ABDOMEN - 1 VIEW COMPARISON:  02/09/2022, 9:32 a.m. FINDINGS: Examination is extremely limited by body habitus and severe underpenetration. A previously seen esophagogastric tube has been removed. There remains distended stomach and small bowel throughout the ventral abdomen, not obviously changed compared to prior examination, however assessment is extremely limited. No obvious free air. IMPRESSION: Examination is extremely limited by body habitus and severe underpenetration. A previously seen esophagogastric tube has been removed. There remains distended stomach and small bowel throughout the ventral abdomen, not obviously changed compared to prior examination and concerning for bowel obstruction, however assessment is extremely limited. Electronically Signed   By: Delanna Ahmadi M.D.   On: 02/09/2022 17:35   DG Chest Port 1 View  Result Date: 02/09/2022 CLINICAL DATA:  Shortness of breath EXAM: PORTABLE CHEST 1 VIEW COMPARISON:  Previous studies including the chest radiograph done on 10/06/2021 FINDINGS: Transverse diameter of heart is increased. There are no signs of alveolar pulmonary edema. There is increased density in left lower lung field obscuring the left hemidiaphragm. Pacemaker battery is seen in the left infraclavicular region. Enteric tube is seen in esophagus. Distal end of enteric tube is not adequately visualized to evaluate its location. IMPRESSION: Cardiomegaly. Increased density in left lower lung fields may suggest left pleural effusion and underlying  atelectasis/pneumonia. Tip of enteric tube is not adequately visualized and location of the tip could not be evaluated. Electronically Signed   By: Elmer Picker M.D.   On: 02/09/2022 17:33    Labs: BMET Recent Labs  Lab 02/09/22 0039 02/09/22 0452 02/10/22 0211 02/10/22 1623 02/11/22 0209  NA 135 134* 134* 138 138  K 3.4* 3.5 4.0 4.3 3.9  CL 96* 94* 97* 100 101  CO2 26 25 23  19* 19*  GLUCOSE 270* 316* 326* 219* 175*  BUN 34* 37* 61* 75* 86*  CREATININE 2.22* 2.34* 3.18* 3.92* 4.48*  CALCIUM 9.1 9.3 8.9 8.9 9.0  PHOS  --   --   --  4.9*  --    CBC Recent Labs  Lab 02/09/22 0039 02/09/22 0452 02/10/22 0211 02/11/22 0209  WBC 10.5 11.9* 5.7 8.6  NEUTROABS 8.5* 10.7* 4.0 6.1  HGB 10.5* 11.0* 11.6* 12.2*  HCT 33.6* 34.4* 35.3* 37.4*  MCV 90.8 90.1 87.8 88.6  PLT 245 249 275 308    Medications:     alfuzosin  10 mg Oral Q breakfast   heparin  5,000 Units Subcutaneous Q8H   insulin aspart  0-15 Units Subcutaneous Q4H   insulin glargine-yfgn  25 Units Subcutaneous Daily   isosorbide mononitrate  60 mg Oral Daily   pantoprazole (PROTONIX) IV  40 mg Intravenous Q24H   rosuvastatin  10 mg Oral Daily   topiramate  25 mg Oral Daily    Madelon Lips, MD 02/11/2022, 12:38 PM

## 2022-02-11 NOTE — Progress Notes (Signed)
Patient ID: Jesse [name], male   DOB: 1950-12-26, 71 y.o.   MRN: [id number]      Subjective: Large amount of stool.  Feels much better.  Epigastric pain near hernia resolved.  Hernias soft reducible.  Objective: Vital signs in last 24 hours: Temp:  [97.5 F (36.4 C)-98.3 F (36.8 C)] 98.3 F (36.8 C) (12/24 0725) Pulse Rate:  [77-93] 81 (12/24 0725) Resp:  [17] 17 (12/24 0725) BP: (114-132)/(63-74) 119/63 (12/24 0725) SpO2:  [96 %-100 %] 99 % (12/24 0725) Last BM Date : 02/11/22  Intake/Output from previous day: 12/23 0701 - 12/24 0700 In: -  Out: 6595 [Emesis/NG output:3975; Stool:2620] Intake/Output this shift: No intake/output data recorded.  General appearance: cooperative Resp: clear to auscultation bilaterally GI: multilobular abdominal wall hernia, hernias now soft and partially reducible - likely back to baseline.  Large amount of stool via ostomy.  Lab Results: CBC  Recent Labs    02/10/22 0211 02/11/22 0209  WBC 5.7 8.6  HGB 11.6* 12.2*  HCT 35.3* 37.4*  PLT 275 308    BMET Recent Labs    02/10/22 1623 02/11/22 0209  NA 138 138  K 4.3 3.9  CL 100 101  CO2 19* 19*  GLUCOSE 219* 175*  BUN 75* 86*  CREATININE 3.92* 4.48*  CALCIUM 8.9 9.0    PT/INR No results for input(s): "LABPROT", "INR" in the last 72 hours. ABG No results for input(s): "PHART", "HCO3" in the last 72 hours.  Invalid input(s): "PCO2", "PO2"  Studies/Results: DG Abd Portable 1V  Result Date: 02/11/2022 CLINICAL DATA:  Small bowel obstruction EXAM: PORTABLE ABDOMEN - 1 VIEW COMPARISON:  Radiograph from yesterday and abdominal CT from 2 days prior FINDINGS: Gas distended stomach despite an enteric tube, appearance unchanged from initial imaging this admission. No gas dilated small bowel loops are seen but these loops are mainly fluid-filled on prior CT. Left pleural effusion that is loculated by CT. IMPRESSION: 1. Continued gaseous distension of the stomach despite enteric  tube, is the tube functioning? 2. Small bowel obstruction with mainly fluid-filled loops by CT, no interval change. Electronically Signed   By: Jorje Guild M.D.   On: 02/11/2022 06:45   DG Abd Portable 1V-Small Bowel Obstruction Protocol-initial, 8 hr delay  Result Date: 02/10/2022 CLINICAL DATA:  Small bowel obstruction EXAM: PORTABLE ABDOMEN - 1 VIEW COMPARISON:  None Available. FINDINGS: NG tube tip has been retracted with tip in the proximal stomach and side port near the GE junction. Gaseous distention of the stomach and multiple dilated loops of small bowel, similar to prior exam. No radio-opaque calculi or other significant radiographic abnormality are seen. IMPRESSION: 1. NG tube tip has been retracted with tip in the proximal stomach and side port near the GE junction, recommend advancement. 2. Persistent small bowel obstruction. Electronically Signed   By: Yetta Glassman M.D.   On: 02/10/2022 20:02   DG Abd Portable 1V  Result Date: 02/10/2022 CLINICAL DATA:  Small-bowel obstruction EXAM: PORTABLE ABDOMEN - 1 VIEW COMPARISON:  Radiograph 02/09/2022 FINDINGS: Nasogastric tube tip and side port overlie the stomach. There is persistent gaseous distension and small bowel dilation. IMPRESSION: Nasogastric tube tip and side port overlie the stomach, which remains distended, correlate with output. Persistent bowel obstruction. Electronically Signed   By: Maurine Simmering M.D.   On: 02/10/2022 08:51   DG Abd Portable 1V  Result Date: 02/09/2022 CLINICAL DATA:  Check gastric catheter placement EXAM: PORTABLE ABDOMEN - 1 VIEW COMPARISON:  None Available.  FINDINGS: Gastric catheter is noted with the tip in the stomach although the proximal side port lies in the distal esophagus. This should be advanced deeper into the stomach. No free air is seen. IMPRESSION: Gastric catheter as described which should be advanced several cm deeper into the stomach. Electronically Signed   By: Inez Catalina M.D.   On:  02/09/2022 21:21   DG Abd Portable 1V  Result Date: 02/09/2022 CLINICAL DATA:  Small bowel obstruction EXAM: PORTABLE ABDOMEN - 1 VIEW COMPARISON:  02/09/2022, 9:32 a.m. FINDINGS: Examination is extremely limited by body habitus and severe underpenetration. A previously seen esophagogastric tube has been removed. There remains distended stomach and small bowel throughout the ventral abdomen, not obviously changed compared to prior examination, however assessment is extremely limited. No obvious free air. IMPRESSION: Examination is extremely limited by body habitus and severe underpenetration. A previously seen esophagogastric tube has been removed. There remains distended stomach and small bowel throughout the ventral abdomen, not obviously changed compared to prior examination and concerning for bowel obstruction, however assessment is extremely limited. Electronically Signed   By: Delanna Ahmadi M.D.   On: 02/09/2022 17:35   DG Chest Port 1 View  Result Date: 02/09/2022 CLINICAL DATA:  Shortness of breath EXAM: PORTABLE CHEST 1 VIEW COMPARISON:  Previous studies including the chest radiograph done on 10/06/2021 FINDINGS: Transverse diameter of heart is increased. There are no signs of alveolar pulmonary edema. There is increased density in left lower lung field obscuring the left hemidiaphragm. Pacemaker battery is seen in the left infraclavicular region. Enteric tube is seen in esophagus. Distal end of enteric tube is not adequately visualized to evaluate its location. IMPRESSION: Cardiomegaly. Increased density in left lower lung fields may suggest left pleural effusion and underlying atelectasis/pneumonia. Tip of enteric tube is not adequately visualized and location of the tip could not be evaluated. Electronically Signed   By: Elmer Picker M.D.   On: 02/09/2022 17:33   DG Abd Portable 1V-Small Bowel Protocol-Position Verification  Result Date: 02/09/2022 CLINICAL DATA:  NG tube placed  EXAM: PORTABLE ABDOMEN - 1 VIEW COMPARISON:  CT 02/09/2022 FINDINGS: Nasogastric tube tip overlies the stomach, side port near the GE junction. There is marked stomach dilation. Left pleural effusion and adjacent rounded atelectasis, as seen on recent CT. IMPRESSION: Nasogastric tube tip overlies the stomach, side port near the GE junction. Recommend advancement by 7.0 cm. Electronically Signed   By: Maurine Simmering M.D.   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 Faythe Ghee in [facility], he was left open and went back for washout and closure the following day - SBO appears resolved on exam today, gaseous distention of stomach persists on imaging likely due to location within large hernia - Try to clamp NG and give clears, if tolerated will remove NG later - patient reports he would only consider surgery as a last resort   ID - none VTE - sq heparin FEN - IVF, CLD Foley - none   Recent TIA vs CVA 10/2021 on plavix (last dose 12/21 in AM) DM HLD OSA Chronic respiratory failure on home O2 Morbid obesity BMI 48.82 SSS s/p PPM CHF CAD with h/o cardiac stents HTN CKD-IV   LOS: 2 days  Felicie Morn, [facility]   02/11/2022

## 2022-02-11 NOTE — Progress Notes (Signed)
PROGRESS NOTE                                                                                                                                                                                                             Patient Demographics:    Jesse [name], is a 71 y.o. 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   Subjective:   Patient in bed, appears comfortable, denies any headache, no fever, no chest pain or pressure, no shortness of breath , improved nausea and abdominal pain. No focal weakness.   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 Faythe Ghee in [facility], he was left open and went back for washout and closure the following day  - with history of ventral hernia, currently getting bowel rest with NG tube and IV fluids, maintain electrolytes, supportive care for pain and nausea, discussed with general surgery x 4, rediscussed twice on 02/10/2022.  NG tube in good position with suctioning.  [place] improved with NG tube producing good return and ostomy putting out quite a bit of  output since 02/10/2022 evening, continue to monitor.  Defer management of this issue to general surgery.  AKI on CKD (chronic kidney disease) stage 4, GFR 15-29 ml/min (HCC) - baseline SCr 2.2-2.5 - due to SBO and NG tube suction and n.p.o. status he appears dehydrated clinically, urine sodium <10, added IV fluid bolus again on 02/11/2022 and increased maintenance rate, avoid nephrotoxins, nephrology on board.  Foley inserted for strict  monitoring of urine output in the setting of his body habitus where he is incontinent and unable to use urinal.  Urine still looks quite concentrated.  Wheelchair dependent - Chronic. Lives at [facility]), PT OT.  Essential hypertension - Pt will be NPO. Add clonidine patch. Prn IV labetalol  Chronic systolic congestive heart failure ([place]) - Will have to hold GDMT due to NPO status. Very cautious IVF.  Obesity, Class III, BMI 40-49.9 (morbid obesity) (HCC) - Chronic. BMI 48, follow-up with PCP for weight loss.  OSA (obstructive sleep apnea) - Pt cannot use CPAP while he has NG tube and SBO.  Ventral hernia - Chronic.  Incidental finding of probably cirrhosis of liver and nonspecific renal findings.  Will need dedicated outpatient renal and liver ultrasound to be done by PCP few weeks after discharge.    Type 2 diabetes mellitus (HCC) Adjust insulin regimen for better control.  CBG (last 3)  Recent Labs    02/11/22 0006 02/11/22 0417 02/11/22 0734  GLUCAP 201* 183* 176*        Condition - Fair  Family Communication  : Daughter Denny Peon [phone] at bedside on 02/09/2022, 02/10/22   Code Status :  Full  Consults  :  CCS, nephrology  PUD Prophylaxis : PPI   Procedures  :     CT - 1. Distention of the stomach and proximal small bowel measuring up to 4.7 cm to the level of the left spigelian hernia. The small bowel in the spigelian hernia pouch measures up to 4 cm. The remaining distal small-bowel is decompressed.  Findings are concerning for small bowel obstruction, however the hernia pouch is only partially visualized on exam due to patient's body habitus. Surgical consultation is recommended. 2. Large ventral abdominal wall hernia containing a portion of the stomach, small bowel, and colon, only partially visualized on exam. 3. Nodular contour of the liver, suggesting underlying cirrhosis. 4. Exophytic isodense lesion in the posterior aspect of the left kidney which is new from the previous exam. Ultrasound is recommended for further characterization. 5. Small left pleural effusion with rounded opacities at the left lung base, possible atelectasis or pneumonia. The possibility of underlying malignancy can not be completely excluded. 6. Aortic atherosclerosis.      Disposition Plan  :    Status is: Inpatient  DVT Prophylaxis  :    heparin injection 5,000 Units Start: 02/09/22 0600 SCDs Start: 02/09/22 0429    Lab Results  Component Value Date   PLT 308 02/11/2022    Diet :  Diet Order             Diet clear liquid Room service appropriate? Yes; Fluid consistency: Thin  Diet effective now                    Inpatient Medications  Scheduled Meds:  alfuzosin  10 mg Oral Q breakfast   heparin  5,000 Units Subcutaneous Q8H   insulin aspart  0-15 Units Subcutaneous Q4H   insulin glargine-yfgn  25 Units Subcutaneous Daily   isosorbide mononitrate  60 mg Oral Daily   pantoprazole (PROTONIX) IV  40 mg Intravenous Q24H   rosuvastatin  10 mg Oral Daily   topiramate  25 mg Oral Daily   Continuous Infusions:  lactated ringers 1,000 mL with potassium chloride 20 mEq infusion 125 mL/hr at 02/11/22 0631   lactated ringers     promethazine (PHENERGAN) injection (IM or IVPB) 12.5 mg (02/10/22 2041)   PRN  Meds:.ALPRAZolam, hydrALAZINE, HYDROmorphone (DILAUDID) injection, labetalol, ondansetron (ZOFRAN) IV, phenol, promethazine (PHENERGAN) injection (IM or IVPB)  Antibiotics  :     Anti-infectives (From admission, onward)    None         Objective:   Vitals:   02/10/22 0442 02/10/22 1927 02/11/22 0538 02/11/22 0725  BP: (!) 129/59 132/74 114/64 119/63  Pulse: 87 93 77 81  Resp: 19 17 17 17   Temp: 97.9 F (36.6 C) (!) 97.5 F (36.4 C)  98.3 F (36.8 C)  TempSrc:  Oral    SpO2: 99% 100% 96% 99%  Weight:      Height:        Wt Readings from Last 3 Encounters:  02/09/22 (!) 163.3 kg  12/28/21 (!) 160.6 kg  12/22/21 (!) 158.8 kg     Intake/Output Summary (Last 24 hours) at 02/11/2022 0913 Last data filed at 02/11/2022 0538 Gross per 24 hour  Intake --  Output 6595 ml  Net -6595 ml     Physical Exam  Awake Alert, No new F.N deficits, NG tube in place with copious amounts of suction fluid in the canister, ostomy has good output on 02/11/2022, abdomen is less distended [place].AT,PERRAL Supple Neck, No JVD,   Symmetrical Chest wall movement, Good air movement bilaterally, CTAB RRR,No Gallops, Rubs or new Murmurs,  No Cyanosis, Clubbing or edema        Data Review:    Recent Labs  Lab 02/09/22 0039 02/09/22 0452 02/10/22 0211 02/11/22 0209  WBC 10.5 11.9* 5.7 8.6  HGB 10.5* 11.0* 11.6* 12.2*  HCT 33.6* 34.4* 35.3* 37.4*  PLT 245 249 275 308  MCV 90.8 90.1 87.8 88.6  MCH 28.4 28.8 28.9 28.9  MCHC 31.3 32.0 32.9 32.6  RDW 15.3 15.5 15.7* 16.0*  LYMPHSABS 0.8 0.4* 0.5* 0.7  MONOABS 1.0 0.7 1.2* 1.9*  EOSABS 0.1 0.0 0.0 0.0  BASOSABS 0.0 0.0 0.0 0.0    Recent Labs  Lab 02/09/22 0039 02/09/22 0452 02/10/22 0211 02/10/22 1623 02/11/22 0209  NA 135 134* 134* 138 138  K 3.4* 3.5 4.0 4.3 3.9  CL 96* 94* 97* 100 101  CO2 26 25 23  19* 19*  ANIONGAP 13 15 14  19* 18*  GLUCOSE 270* 316* 326* 219* 175*  BUN 34* 37* 61* 75* 86*  CREATININE 2.22* 2.34* 3.18* 3.92* 4.48*  AST 20 20  --   --   --   ALT 15 14  --   --   --   ALKPHOS 59 65  --   --   --   BILITOT 0.7 0.8  --   --   --   ALBUMIN 3.5 3.5  --  3.4*  --   BNP  --  48.0  82.5  --  55.5  MG  --  2.7* 2.8*  --  2.9*  CALCIUM 9.1 9.3 8.9 8.9 9.0      Radiology Reports DG Abd Portable 1V  Result Date: 02/11/2022 CLINICAL DATA:  Small bowel obstruction EXAM: PORTABLE ABDOMEN - 1 VIEW COMPARISON:  Radiograph from yesterday and abdominal CT from 2 days prior FINDINGS: Gas distended stomach despite an enteric tube, appearance unchanged from initial imaging this admission. No gas dilated small bowel loops are seen but these loops are mainly fluid-filled on prior CT. Left pleural effusion that is loculated by CT. IMPRESSION: 1. Continued gaseous distension of the stomach despite enteric tube, is the tube functioning? 2. Small bowel obstruction with mainly fluid-filled loops by CT,  no interval change. Electronically Signed   By: Jorje Guild M.D.   On: 02/11/2022 06:45   DG Abd Portable 1V-Small Bowel Obstruction Protocol-initial, 8 hr delay  Result Date: 02/10/2022 CLINICAL DATA:  Small bowel obstruction EXAM: PORTABLE ABDOMEN - 1 VIEW COMPARISON:  None Available. FINDINGS: NG tube tip has been retracted with tip in the proximal stomach and side port near the GE junction. Gaseous distention of the stomach and multiple dilated loops of small bowel, similar to prior exam. No radio-opaque calculi or other significant radiographic abnormality are seen. IMPRESSION: 1. NG tube tip has been retracted with tip in the proximal stomach and side port near the GE junction, recommend advancement. 2. Persistent small bowel obstruction. Electronically Signed   By: Yetta Glassman M.D.   On: 02/10/2022 20:02   DG Abd Portable 1V  Result Date: 02/10/2022 CLINICAL DATA:  Small-bowel obstruction EXAM: PORTABLE ABDOMEN - 1 VIEW COMPARISON:  Radiograph 02/09/2022 FINDINGS: Nasogastric tube tip and side port overlie the stomach. There is persistent gaseous distension and small bowel dilation. IMPRESSION: Nasogastric tube tip and side port overlie the stomach, which remains distended,  correlate with output. Persistent bowel obstruction. Electronically Signed   By: Maurine Simmering M.D.   On: 02/10/2022 08:51   DG Abd Portable 1V  Result Date: 02/09/2022 CLINICAL DATA:  Check gastric catheter placement EXAM: PORTABLE ABDOMEN - 1 VIEW COMPARISON:  None Available. FINDINGS: Gastric catheter is noted with the tip in the stomach although the proximal side port lies in the distal esophagus. This should be advanced deeper into the stomach. No free air is seen. IMPRESSION: Gastric catheter as described which should be advanced several cm deeper into the stomach. Electronically Signed   By: Inez Catalina M.D.   On: 02/09/2022 21:21   DG Abd Portable 1V  Result Date: 02/09/2022 CLINICAL DATA:  Small bowel obstruction EXAM: PORTABLE ABDOMEN - 1 VIEW COMPARISON:  02/09/2022, 9:32 a.m. FINDINGS: Examination is extremely limited by body habitus and severe underpenetration. A previously seen esophagogastric tube has been removed. There remains distended stomach and small bowel throughout the ventral abdomen, not obviously changed compared to prior examination, however assessment is extremely limited. No obvious free air. IMPRESSION: Examination is extremely limited by body habitus and severe underpenetration. A previously seen esophagogastric tube has been removed. There remains distended stomach and small bowel throughout the ventral abdomen, not obviously changed compared to prior examination and concerning for bowel obstruction, however assessment is extremely limited. Electronically Signed   By: Delanna Ahmadi M.D.   On: 02/09/2022 17:35   DG Chest Port 1 View  Result Date: 02/09/2022 CLINICAL DATA:  Shortness of breath EXAM: PORTABLE CHEST 1 VIEW COMPARISON:  Previous studies including the chest radiograph done on 10/06/2021 FINDINGS: Transverse diameter of heart is increased. There are no signs of alveolar pulmonary edema. There is increased density in left lower lung field obscuring the left  hemidiaphragm. Pacemaker battery is seen in the left infraclavicular region. Enteric tube is seen in esophagus. Distal end of enteric tube is not adequately visualized to evaluate its location. IMPRESSION: Cardiomegaly. Increased density in left lower lung fields may suggest left pleural effusion and underlying atelectasis/pneumonia. Tip of enteric tube is not adequately visualized and location of the tip could not be evaluated. Electronically Signed   By: Elmer Picker M.D.   On: 02/09/2022 17:33   DG Abd Portable 1V-Small Bowel Protocol-Position Verification  Result Date: 02/09/2022 CLINICAL DATA:  NG tube placed EXAM: PORTABLE ABDOMEN -  1 VIEW COMPARISON:  CT 02/09/2022 FINDINGS: Nasogastric tube tip overlies the stomach, side port near the GE junction. There is marked stomach dilation. Left pleural effusion and adjacent rounded atelectasis, as seen on recent CT. IMPRESSION: Nasogastric tube tip overlies the stomach, side port near the GE junction. Recommend advancement by 7.0 cm. Electronically Signed   By: Maurine Simmering M.D.   On: 02/09/2022 12:50   CT ABDOMEN PELVIS W CONTRAST  Result Date: 02/09/2022 CLINICAL DATA:  Bowel obstruction suspected, history of multiple surgeries. No osteotomy output in 6 hours. Abdominal distention. EXAM: CT ABDOMEN AND PELVIS WITH CONTRAST TECHNIQUE: Multidetector CT imaging of the abdomen and pelvis was performed using the standard protocol following bolus administration of intravenous contrast. RADIATION DOSE REDUCTION: This exam was performed according to the departmental dose-optimization program which includes automated exposure control, adjustment of the mA and/or kV according to patient size and/or use of iterative reconstruction technique. CONTRAST:  9mL OMNIPAQUE IOHEXOL 350 MG/ML SOLN COMPARISON:  03/06/2015. FINDINGS: Lower chest: The heart is enlarged and there is a small pericardial effusion. Pacemaker leads are noted in the heart. With rounded  opacities at the left lung base. Right lung base is clear. Hepatobiliary: The liver has a nodular contour suggesting underlying cirrhosis. The gallbladder is without stones. No biliary ductal dilatation. Pancreas: Fatty atrophy. No pancreatic ductal dilatation or surrounding inflammatory changes. Spleen: Normal in size without focal abnormality. Adrenals/Urinary Tract: No adrenal nodule or mass. There is bilateral renal atrophy with renal cortical thinning hearing. A isoechoic exophytic lesion is present along the posterior aspect of the left kidney measuring 2.5 cm. No renal calculus or hydronephrosis. The bladder is unremarkable. Stomach/Bowel: The distal esophagus is mildly distended with fluid. There is a large complex ventral abdominal wall hernia containing small bowel, stomach, and a portion of the colon in the midline. The hernia pouch is are not well evaluated and incompletely imaged due to field of view and patient's body habitus. There is dilatation of the proximal small bowel with air-fluid levels measuring up to 4.7 cm to the level of a spigelian hernia on the left. The visualized loops of small bowel in the spigelian hernia pouch are distended measuring 4 cm, however only partially included in the field of view. The remaining loops of small bowel in the abdomen and hernia pouch are decompressed. No free air or pneumatosis. An ostomy is noted in the right lower quadrant. Vascular/Lymphatic: Aortic atherosclerosis. No enlarged abdominal or pelvic lymph nodes. Reproductive: Prostate is unremarkable. Other: No abdominopelvic ascites. Musculoskeletal: Degenerative changes in the thoracolumbar spine. No acute osseous abnormality. IMPRESSION: 1. Distention of the stomach and proximal small bowel measuring up to 4.7 cm to the level of the left spigelian hernia. The small bowel in the spigelian hernia pouch measures up to 4 cm. The remaining distal small-bowel is decompressed. Findings are concerning for small  bowel obstruction, however the hernia pouch is only partially visualized on exam due to patient's body habitus. Surgical consultation is recommended. 2. Large ventral abdominal wall hernia containing a portion of the stomach, small bowel, and colon, only partially visualized on exam. 3. Nodular contour of the liver, suggesting underlying cirrhosis. 4. Exophytic isodense lesion in the posterior aspect of the left kidney which is new from the previous exam. Ultrasound is recommended for further characterization. 5. Small left pleural effusion with rounded opacities at the left lung base, possible atelectasis or pneumonia. The possibility of underlying malignancy can not be completely excluded. 6. Aortic atherosclerosis. 7. Remaining  findings as described above. Electronically Signed   By: Brett Fairy M.D.   On: 02/09/2022 03:26      Signature  -   Lala Lund M.D on 02/11/2022 at 9:13 AM   -  To page go to [email or web address]

## 2022-02-12 ENCOUNTER — Inpatient Hospital Stay (HOSPITAL_COMMUNITY): Payer: Medicare PPO

## 2022-02-12 DIAGNOSIS — K56609 Unspecified intestinal obstruction, unspecified as to partial versus complete obstruction: Secondary | ICD-10-CM | POA: Diagnosis not present

## 2022-02-12 DIAGNOSIS — R45851 Suicidal ideations: Secondary | ICD-10-CM

## 2022-02-12 LAB — CBC WITH DIFFERENTIAL/PLATELET
Abs Immature Granulocytes: 0.05 10*3/uL (ref 0.00–0.07)
Basophils Absolute: 0 10*3/uL (ref 0.0–0.1)
Basophils Relative: 0 %
Eosinophils Absolute: 0 10*3/uL (ref 0.0–0.5)
Eosinophils Relative: 0 %
HCT: 32.9 % — ABNORMAL LOW (ref 39.0–52.0)
Hemoglobin: 10.4 g/dL — ABNORMAL LOW (ref 13.0–17.0)
Immature Granulocytes: 1 %
Lymphocytes Relative: 9 %
Lymphs Abs: 0.6 10*3/uL — ABNORMAL LOW (ref 0.7–4.0)
MCH: 28.7 pg (ref 26.0–34.0)
MCHC: 31.6 g/dL (ref 30.0–36.0)
MCV: 90.6 fL (ref 80.0–100.0)
Monocytes Absolute: 1.3 10*3/uL — ABNORMAL HIGH (ref 0.1–1.0)
Monocytes Relative: 19 %
Neutro Abs: 5 10*3/uL (ref 1.7–7.7)
Neutrophils Relative %: 71 %
Platelets: 274 10*3/uL (ref 150–400)
RBC: 3.63 MIL/uL — ABNORMAL LOW (ref 4.22–5.81)
RDW: 15.6 % — ABNORMAL HIGH (ref 11.5–15.5)
WBC: 7 10*3/uL (ref 4.0–10.5)
nRBC: 0 % (ref 0.0–0.2)

## 2022-02-12 LAB — BASIC METABOLIC PANEL
Anion gap: 15 (ref 5–15)
BUN: 91 mg/dL — ABNORMAL HIGH (ref 8–23)
CO2: 19 mmol/L — ABNORMAL LOW (ref 22–32)
Calcium: 8.4 mg/dL — ABNORMAL LOW (ref 8.9–10.3)
Chloride: 104 mmol/L (ref 98–111)
Creatinine, Ser: 3.83 mg/dL — ABNORMAL HIGH (ref 0.61–1.24)
GFR, Estimated: 16 mL/min — ABNORMAL LOW (ref >60)
Glucose, Bld: 146 mg/dL — ABNORMAL HIGH (ref 70–99)
Potassium: 3.7 mmol/L (ref 3.5–5.1)
Sodium: 138 mmol/L (ref 135–145)

## 2022-02-12 LAB — UREA NITROGEN, URINE: Urea Nitrogen, Ur: 232 mg/dL

## 2022-02-12 LAB — BRAIN NATRIURETIC PEPTIDE: B Natriuretic Peptide: 51.2 pg/mL (ref 0.0–100.0)

## 2022-02-12 LAB — GLUCOSE, CAPILLARY
Glucose-Capillary: 135 mg/dL — ABNORMAL HIGH (ref 70–99)
Glucose-Capillary: 141 mg/dL — ABNORMAL HIGH (ref 70–99)
Glucose-Capillary: 144 mg/dL — ABNORMAL HIGH (ref 70–99)
Glucose-Capillary: 164 mg/dL — ABNORMAL HIGH (ref 70–99)
Glucose-Capillary: 199 mg/dL — ABNORMAL HIGH (ref 70–99)
Glucose-Capillary: 204 mg/dL — ABNORMAL HIGH (ref 70–99)

## 2022-02-12 LAB — MAGNESIUM: Magnesium: 2.9 mg/dL — ABNORMAL HIGH (ref 1.7–2.4)

## 2022-02-12 MED ORDER — POTASSIUM CHLORIDE 2 MEQ/ML IV SOLN
INTRAVENOUS | Status: DC
  Filled 2022-02-12 (×2): qty 1000

## 2022-02-12 MED ORDER — LACTATED RINGERS IV BOLUS
1000.0000 mL | Freq: Once | INTRAVENOUS | Status: AC
  Administered 2022-02-12: 1000 mL via INTRAVENOUS

## 2022-02-12 MED ORDER — ACETAMINOPHEN 325 MG PO TABS: 650.0000 mg | ORAL_TABLET | Freq: Four times a day (QID) | ORAL | Status: DC | PRN

## 2022-02-12 MED ORDER — SIMETHICONE 80 MG PO CHEW
80.0000 mg | CHEWABLE_TABLET | Freq: Four times a day (QID) | ORAL | Status: DC | PRN
  Administered 2022-02-12: 80 mg via ORAL
  Filled 2022-02-12 (×2): qty 1

## 2022-02-12 MED ORDER — DULOXETINE HCL 60 MG PO CPEP
60.0000 mg | ORAL_CAPSULE | Freq: Every day | ORAL | Status: DC
  Administered 2022-02-12 – 2022-02-13 (×2): 60 mg via ORAL
  Filled 2022-02-12 (×2): qty 1

## 2022-02-12 NOTE — Progress Notes (Signed)
Patient ID: Jesse [name], male   DOB: May 13, 1950, 71 y.o.   MRN: [id number]      Subjective: Reports NGT was placed back on suction because ostomy output decreased ROS negative except as listed above. Objective: Vital signs in last 24 hours: Temp:  [97.6 F (36.4 C)-98.8 F (37.1 C)] 97.6 F (36.4 C) (12/25 0739) Pulse Rate:  [81-91] 85 (12/25 0739) Resp:  [16-18] 18 (12/25 0739) BP: (111-121)/(53-58) 119/58 (12/25 0739) SpO2:  [95 %-98 %] 98 % (12/25 0739) Last BM Date : 02/11/22  Intake/Output from previous day: 12/24 0701 - 12/25 0700 In: 3164.4 [I.V.:3114.4; IV Piggyback:50] Out: 8416 [Urine:1610; Emesis/NG output:1815; Stool:120] Intake/Output this shift: No intake/output data recorded.  General appearance: alert and cooperative Resp: clear to auscultation bilaterally GI: hernias softer, a lot of air in bag  Lab Results: CBC  Recent Labs    02/11/22 0209 02/12/22 0212  WBC 8.6 7.0  HGB 12.2* 10.4*  HCT 37.4* 32.9*  PLT 308 274   BMET Recent Labs    02/11/22 0209 02/12/22 0212  NA 138 138  K 3.9 3.7  CL 101 104  CO2 19* 19*  GLUCOSE 175* 146*  BUN 86* 91*  CREATININE 4.48* 3.83*  CALCIUM 9.0 8.4*   PT/INR No results for input(s): "LABPROT", "INR" in the last 72 hours. ABG No results for input(s): "PHART", "HCO3" in the last 72 hours.  Invalid input(s): "PCO2", "PO2"  Studies/Results: DG Abd Portable 1V  Result Date: 02/12/2022 CLINICAL DATA:  Small-bowel obstruction, verify NG tube placement EXAM: PORTABLE ABDOMEN - 1 VIEW COMPARISON:  Radiograph 02/11/2022 FINDINGS: Nasogastric tube tip and side port overlie the stomach. There is persistent bowel and stomach dilation, not significantly changed from prior. Note that a portion of the herniated stomach remains distended. Persistent small left pleural effusion adjacent atelectasis. IMPRESSION: Nasogastric tube tip and side port overlie the proximal stomach, with persistent distension of the more  distal herniated stomach and persistent small bowel dilation compatible with obstruction. Persistent small left pleural effusion and adjacent atelectasis. Electronically Signed   By: Maurine Simmering M.D.   On: 02/12/2022 08:31   DG Abd Portable 1V  Result Date: 02/11/2022 CLINICAL DATA:  Small bowel obstruction EXAM: PORTABLE ABDOMEN - 1 VIEW COMPARISON:  Radiograph from yesterday and abdominal CT from 2 days prior FINDINGS: Gas distended stomach despite an enteric tube, appearance unchanged from initial imaging this admission. No gas dilated small bowel loops are seen but these loops are mainly fluid-filled on prior CT. Left pleural effusion that is loculated by CT. IMPRESSION: 1. Continued gaseous distension of the stomach despite enteric tube, is the tube functioning? 2. Small bowel obstruction with mainly fluid-filled loops by CT, no interval change. Electronically Signed   By: Jorje Guild M.D.   On: 02/11/2022 06:45   DG Abd Portable 1V-Small Bowel Obstruction Protocol-initial, 8 hr delay  Result Date: 02/10/2022 CLINICAL DATA:  Small bowel obstruction EXAM: PORTABLE ABDOMEN - 1 VIEW COMPARISON:  None Available. FINDINGS: NG tube tip has been retracted with tip in the proximal stomach and side port near the GE junction. Gaseous distention of the stomach and multiple dilated loops of small bowel, similar to prior exam. No radio-opaque calculi or other significant radiographic abnormality are seen. IMPRESSION: 1. NG tube tip has been retracted with tip in the proximal stomach and side port near the GE junction, recommend advancement. 2. Persistent small bowel obstruction. Electronically Signed   By: Yetta Glassman M.D.   On: 02/10/2022  20:02    Anti-infectives: Anti-infectives (From admission, onward)    None       Assessment/Plan: SBO Large chronically incarcerated ventral hernias - hx open appendectomy; [facility] for diverticulitis with reversal in 1987; ischemic transverse colon s/p  resection and ascending colostomy 02/2015 Dr. Faythe Ghee in [facility], he was left open and went back for washout and closure the following day - SBO resolving. All contrast went through and out. Leave NGT clamped and give clears. - I spoke with his family  Suicidal ideation - Psychiatry consult per primary   ID - none VTE - sq heparin FEN - IVF, CLD Foley - none   Recent TIA vs CVA 10/2021 on plavix (last dose 12/21 in AM) DM HLD OSA Chronic respiratory failure on home O2 Morbid obesity BMI 48.82 SSS s/p PPM CHF CAD with h/o cardiac stents HTN CKD-IV    LOS: 3 days    Jesse Skeans, MD, MPH, FACS Trauma & General Surgery Use [email or web address] to contact on call provider  02/12/2022

## 2022-02-12 NOTE — Progress Notes (Signed)
PROGRESS NOTE                                                                                                                                                                                                             Patient Demographics:    Jesse [name], is a 71 y.o. male, DOB - 1950/02/22, OEU:[id number]  Outpatient Primary MD for the patient is Ngetich, Nelda Bucks, NP    LOS - 3  Admit date - 02/08/2022    Chief Complaint  Patient presents with   Hernia       Brief Narrative (HPI from H&P)   71 year old white male history of multiple bowel surgeries status post colectomy and end ileostomy, type 2 diabetes on insulin, morbid obesity BMI 48, wheelchair-bound, history of pacemaker, history of diastolic heart failure, hypertension, CKD stage IV baseline creatinine 2.2-2.5, chronic ventral hernia presents to the ER today with no ostomy output since the afternoon of 02/08/2022 he was diagnosed in the ER with small bowel obstruction General surgery was consulted and hospitalist team was requested to admit the patient.   Subjective:   Patient in bed, appears comfortable, denies any headache, no fever, no chest pain or pressure, no shortness of breath , no abdominal pain. No new focal weakness.   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 Faythe Ghee in [facility], he was left open and went back for washout and closure the following day  - with history of ventral hernia, was treated conservatively and seems to be improving on 02/12/2022, case discussed with general surgery, clamp NG tube with a trial of clear liquids.  Monitor.  AKI on CKD (chronic kidney disease) stage 4, GFR 15-29 ml/min (HCC) - baseline SCr 2.2-2.5 - due to SBO and NG tube suction and n.p.o. status he appears dehydrated  clinically, urine sodium <10, added IV fluid bolus again on 02/11/2022 and increased maintenance rate, avoid nephrotoxins, nephrology on board.  Foley inserted for strict monitoring of urine output in the setting of his body habitus where he is incontinent and unable to use urinal.  Urine still looks quite concentrated.  Depression with questionable suicidal ideation on 02/11/2022 night.  This ideation has resolved he states he feels  good.  And currently is not suicidal homicidal, obtain psych input, home antidepressant started with bowel function improving on 02/12/2022.    Wheelchair dependent - Chronic. Lives at [facility]), PT OT.  Essential hypertension - Pt will be NPO. Add clonidine patch. Prn IV labetalol  Chronic systolic congestive heart failure ([place]) - Will have to hold GDMT due to NPO status. Very cautious IVF.  Obesity, Class III, BMI 40-49.9 (morbid obesity) (HCC) - Chronic. BMI 48, follow-up with PCP for weight loss.  OSA (obstructive sleep apnea) - Pt cannot use CPAP while he has NG tube and SBO.  Ventral hernia - Chronic.  Incidental finding of probably cirrhosis of liver and nonspecific renal findings.  Will need dedicated outpatient renal and liver ultrasound to be done by PCP few weeks after discharge.    Type 2 diabetes mellitus (HCC) Adjust insulin regimen for better control.  CBG (last 3)  Recent Labs    02/12/22 0026 02/12/22 0355 02/12/22 0808  GLUCAP 164* 144* 135*        Condition - Fair  Family Communication  : Daughter Denny Peon [phone] at bedside on 02/09/2022, 02/10/22  Power of attorney ex-wife bedside 02/12/2022.   Code Status :  Full  Consults  :  CCS, nephrology, psychiatry  PUD Prophylaxis : PPI   Procedures  :     CT - 1. Distention of the stomach and proximal small bowel measuring up to 4.7 cm to the level of the left spigelian hernia. The small bowel in the spigelian hernia pouch measures up to 4 cm. The  remaining distal small-bowel is decompressed. Findings are concerning for small bowel obstruction, however the hernia pouch is only partially visualized on exam due to patient's body habitus. Surgical consultation is recommended. 2. Large ventral abdominal wall hernia containing a portion of the stomach, small bowel, and colon, only partially visualized on exam. 3. Nodular contour of the liver, suggesting underlying cirrhosis. 4. Exophytic isodense lesion in the posterior aspect of the left kidney which is new from the previous exam. Ultrasound is recommended for further characterization. 5. Small left pleural effusion with rounded opacities at the left lung base, possible atelectasis or pneumonia. The possibility of underlying malignancy can not be completely excluded. 6. Aortic atherosclerosis.      Disposition Plan  :    Status is: Inpatient  DVT Prophylaxis  :    heparin injection 5,000 Units Start: 02/09/22 0600 SCDs Start: 02/09/22 0429    Lab Results  Component Value Date   PLT 274 02/12/2022    Diet :  Diet Order             Diet clear liquid Room service appropriate? Yes; Fluid consistency: Thin  Diet effective now                    Inpatient Medications  Scheduled Meds:  alfuzosin  10 mg Oral Q breakfast   DULoxetine  60 mg Oral Daily   heparin  5,000 Units Subcutaneous Q8H   insulin aspart  0-15 Units Subcutaneous Q4H   insulin glargine-yfgn  25 Units Subcutaneous Daily   isosorbide mononitrate  60 mg Oral Daily   pantoprazole (PROTONIX) IV  40 mg Intravenous Q24H   rosuvastatin  10 mg Oral Daily   topiramate  25 mg Oral Daily   Continuous Infusions:  lactated ringers 1,000 mL with potassium chloride 20 mEq infusion 125 mL/hr at 02/12/22 0739   promethazine (PHENERGAN) injection (IM or IVPB) 12.5  mg (02/12/22 0046)   PRN Meds:.ALPRAZolam, cyclobenzaprine, hydrALAZINE, HYDROmorphone (DILAUDID) injection, labetalol, ondansetron (ZOFRAN) IV, phenol,  promethazine (PHENERGAN) injection (IM or IVPB)  Antibiotics  :    Anti-infectives (From admission, onward)    None         Objective:   Vitals:   02/11/22 1640 02/11/22 2048 02/12/22 0600 02/12/22 0739  BP: (!) 111/55 (!) 121/58 (!) 118/53 (!) 119/58  Pulse: 91 90 81 85  Resp: 18 16 18 18   Temp: 98.8 F (37.1 C) 97.6 F (36.4 C) 98 F (36.7 C) 97.6 F (36.4 C)  TempSrc:  Tympanic Oral Oral  SpO2: 98% 95% 97% 98%  Weight:      Height:        Wt Readings from Last 3 Encounters:  02/09/22 (!) 163.3 kg  12/28/21 (!) 160.6 kg  12/22/21 (!) 158.8 kg     Intake/Output Summary (Last 24 hours) at 02/12/2022 0950 Last data filed at 02/12/2022 0500 Gross per 24 hour  Intake 3164.4 ml  Output 3545 ml  Net -380.6 ml     Physical Exam  Awake Alert, No new F.N deficits, NG tube in place with reduced output, colostomy output has gone down except gas, abdominal distention and hernial distention much improved on 02/12/2022 [email or web address].AT,PERRAL Supple Neck, No JVD,   Symmetrical Chest wall movement, Good air movement bilaterally, CTAB RRR,No Gallops, Rubs or new Murmurs,  No Cyanosis, Clubbing or edema        Data Review:    Recent Labs  Lab 02/09/22 0039 02/09/22 0452 02/10/22 0211 02/11/22 0209 02/12/22 0212  WBC 10.5 11.9* 5.7 8.6 7.0  HGB 10.5* 11.0* 11.6* 12.2* 10.4*  HCT 33.6* 34.4* 35.3* 37.4* 32.9*  PLT 245 249 275 308 274  MCV 90.8 90.1 87.8 88.6 90.6  MCH 28.4 28.8 28.9 28.9 28.7  MCHC 31.3 32.0 32.9 32.6 31.6  RDW 15.3 15.5 15.7* 16.0* 15.6*  LYMPHSABS 0.8 0.4* 0.5* 0.7 0.6*  MONOABS 1.0 0.7 1.2* 1.9* 1.3*  EOSABS 0.1 0.0 0.0 0.0 0.0  BASOSABS 0.0 0.0 0.0 0.0 0.0    Recent Labs  Lab 02/09/22 0039 02/09/22 0452 02/10/22 0211 02/10/22 1623 02/11/22 0209 02/12/22 0212  NA 135 134* 134* 138 138 138  K 3.4* 3.5 4.0 4.3 3.9 3.7  CL 96* 94* 97* 100 101 104  CO2 26 25 23  19* 19* 19*  ANIONGAP 13 15 14  19* 18* 15  GLUCOSE 270* 316* 326* 219* 175*  146*  BUN 34* 37* 61* 75* 86* 91*  CREATININE 2.22* 2.34* 3.18* 3.92* 4.48* 3.83*  AST 20 20  --   --   --   --   ALT 15 14  --   --   --   --   ALKPHOS 59 65  --   --   --   --   BILITOT 0.7 0.8  --   --   --   --   ALBUMIN 3.5 3.5  --  3.4*  --   --   BNP  --  48.0 82.5  --  55.5 51.2  MG  --  2.7* 2.8*  --  2.9* 2.9*  CALCIUM 9.1 9.3 8.9 8.9 9.0 8.4*      Radiology Reports DG Abd Portable 1V  Result Date: 02/12/2022 CLINICAL DATA:  Small-bowel obstruction, verify NG tube placement EXAM: PORTABLE ABDOMEN - 1 VIEW COMPARISON:  Radiograph 02/11/2022 FINDINGS: Nasogastric tube tip and side port overlie the stomach. There is persistent bowel and  stomach dilation, not significantly changed from prior. Note that a portion of the herniated stomach remains distended. Persistent small left pleural effusion adjacent atelectasis. IMPRESSION: Nasogastric tube tip and side port overlie the proximal stomach, with persistent distension of the more distal herniated stomach and persistent small bowel dilation compatible with obstruction. Persistent small left pleural effusion and adjacent atelectasis. Electronically Signed   By: Maurine Simmering M.D.   On: 02/12/2022 08:31   DG Abd Portable 1V  Result Date: 02/11/2022 CLINICAL DATA:  Small bowel obstruction EXAM: PORTABLE ABDOMEN - 1 VIEW COMPARISON:  Radiograph from yesterday and abdominal CT from 2 days prior FINDINGS: Gas distended stomach despite an enteric tube, appearance unchanged from initial imaging this admission. No gas dilated small bowel loops are seen but these loops are mainly fluid-filled on prior CT. Left pleural effusion that is loculated by CT. IMPRESSION: 1. Continued gaseous distension of the stomach despite enteric tube, is the tube functioning? 2. Small bowel obstruction with mainly fluid-filled loops by CT, no interval change. Electronically Signed   By: Jorje Guild M.D.   On: 02/11/2022 06:45   DG Abd Portable 1V-Small Bowel  Obstruction Protocol-initial, 8 hr delay  Result Date: 02/10/2022 CLINICAL DATA:  Small bowel obstruction EXAM: PORTABLE ABDOMEN - 1 VIEW COMPARISON:  None Available. FINDINGS: NG tube tip has been retracted with tip in the proximal stomach and side port near the GE junction. Gaseous distention of the stomach and multiple dilated loops of small bowel, similar to prior exam. No radio-opaque calculi or other significant radiographic abnormality are seen. IMPRESSION: 1. NG tube tip has been retracted with tip in the proximal stomach and side port near the GE junction, recommend advancement. 2. Persistent small bowel obstruction. Electronically Signed   By: Yetta Glassman M.D.   On: 02/10/2022 20:02   DG Abd Portable 1V  Result Date: 02/10/2022 CLINICAL DATA:  Small-bowel obstruction EXAM: PORTABLE ABDOMEN - 1 VIEW COMPARISON:  Radiograph 02/09/2022 FINDINGS: Nasogastric tube tip and side port overlie the stomach. There is persistent gaseous distension and small bowel dilation. IMPRESSION: Nasogastric tube tip and side port overlie the stomach, which remains distended, correlate with output. Persistent bowel obstruction. Electronically Signed   By: Maurine Simmering M.D.   On: 02/10/2022 08:51   DG Abd Portable 1V  Result Date: 02/09/2022 CLINICAL DATA:  Check gastric catheter placement EXAM: PORTABLE ABDOMEN - 1 VIEW COMPARISON:  None Available. FINDINGS: Gastric catheter is noted with the tip in the stomach although the proximal side port lies in the distal esophagus. This should be advanced deeper into the stomach. No free air is seen. IMPRESSION: Gastric catheter as described which should be advanced several cm deeper into the stomach. Electronically Signed   By: Inez Catalina M.D.   On: 02/09/2022 21:21   DG Abd Portable 1V  Result Date: 02/09/2022 CLINICAL DATA:  Small bowel obstruction EXAM: PORTABLE ABDOMEN - 1 VIEW COMPARISON:  02/09/2022, 9:32 a.m. FINDINGS: Examination is extremely limited by  body habitus and severe underpenetration. A previously seen esophagogastric tube has been removed. There remains distended stomach and small bowel throughout the ventral abdomen, not obviously changed compared to prior examination, however assessment is extremely limited. No obvious free air. IMPRESSION: Examination is extremely limited by body habitus and severe underpenetration. A previously seen esophagogastric tube has been removed. There remains distended stomach and small bowel throughout the ventral abdomen, not obviously changed compared to prior examination and concerning for bowel obstruction, however assessment is extremely limited. Electronically  Signed   By: Delanna Ahmadi M.D.   On: 02/09/2022 17:35   DG Chest Port 1 View  Result Date: 02/09/2022 CLINICAL DATA:  Shortness of breath EXAM: PORTABLE CHEST 1 VIEW COMPARISON:  Previous studies including the chest radiograph done on 10/06/2021 FINDINGS: Transverse diameter of heart is increased. There are no signs of alveolar pulmonary edema. There is increased density in left lower lung field obscuring the left hemidiaphragm. Pacemaker battery is seen in the left infraclavicular region. Enteric tube is seen in esophagus. Distal end of enteric tube is not adequately visualized to evaluate its location. IMPRESSION: Cardiomegaly. Increased density in left lower lung fields may suggest left pleural effusion and underlying atelectasis/pneumonia. Tip of enteric tube is not adequately visualized and location of the tip could not be evaluated. Electronically Signed   By: Elmer Picker M.D.   On: 02/09/2022 17:33   DG Abd Portable 1V-Small Bowel Protocol-Position Verification  Result Date: 02/09/2022 CLINICAL DATA:  NG tube placed EXAM: PORTABLE ABDOMEN - 1 VIEW COMPARISON:  CT 02/09/2022 FINDINGS: Nasogastric tube tip overlies the stomach, side port near the GE junction. There is marked stomach dilation. Left pleural effusion and adjacent rounded  atelectasis, as seen on recent CT. IMPRESSION: Nasogastric tube tip overlies the stomach, side port near the GE junction. Recommend advancement by 7.0 cm. Electronically Signed   By: Maurine Simmering M.D.   On: 02/09/2022 12:50   CT ABDOMEN PELVIS W CONTRAST  Result Date: 02/09/2022 CLINICAL DATA:  Bowel obstruction suspected, history of multiple surgeries. No osteotomy output in 6 hours. Abdominal distention. EXAM: CT ABDOMEN AND PELVIS WITH CONTRAST TECHNIQUE: Multidetector CT imaging of the abdomen and pelvis was performed using the standard protocol following bolus administration of intravenous contrast. RADIATION DOSE REDUCTION: This exam was performed according to the departmental dose-optimization program which includes automated exposure control, adjustment of the mA and/or kV according to patient size and/or use of iterative reconstruction technique. CONTRAST:  1mL OMNIPAQUE IOHEXOL 350 MG/ML SOLN COMPARISON:  03/06/2015. FINDINGS: Lower chest: The heart is enlarged and there is a small pericardial effusion. Pacemaker leads are noted in the heart. With rounded opacities at the left lung base. Right lung base is clear. Hepatobiliary: The liver has a nodular contour suggesting underlying cirrhosis. The gallbladder is without stones. No biliary ductal dilatation. Pancreas: Fatty atrophy. No pancreatic ductal dilatation or surrounding inflammatory changes. Spleen: Normal in size without focal abnormality. Adrenals/Urinary Tract: No adrenal nodule or mass. There is bilateral renal atrophy with renal cortical thinning hearing. A isoechoic exophytic lesion is present along the posterior aspect of the left kidney measuring 2.5 cm. No renal calculus or hydronephrosis. The bladder is unremarkable. Stomach/Bowel: The distal esophagus is mildly distended with fluid. There is a large complex ventral abdominal wall hernia containing small bowel, stomach, and a portion of the colon in the midline. The hernia pouch is are  not well evaluated and incompletely imaged due to field of view and patient's body habitus. There is dilatation of the proximal small bowel with air-fluid levels measuring up to 4.7 cm to the level of a spigelian hernia on the left. The visualized loops of small bowel in the spigelian hernia pouch are distended measuring 4 cm, however only partially included in the field of view. The remaining loops of small bowel in the abdomen and hernia pouch are decompressed. No free air or pneumatosis. An ostomy is noted in the right lower quadrant. Vascular/Lymphatic: Aortic atherosclerosis. No enlarged abdominal or pelvic lymph nodes. Reproductive:  Prostate is unremarkable. Other: No abdominopelvic ascites. Musculoskeletal: Degenerative changes in the thoracolumbar spine. No acute osseous abnormality. IMPRESSION: 1. Distention of the stomach and proximal small bowel measuring up to 4.7 cm to the level of the left spigelian hernia. The small bowel in the spigelian hernia pouch measures up to 4 cm. The remaining distal small-bowel is decompressed. Findings are concerning for small bowel obstruction, however the hernia pouch is only partially visualized on exam due to patient's body habitus. Surgical consultation is recommended. 2. Large ventral abdominal wall hernia containing a portion of the stomach, small bowel, and colon, only partially visualized on exam. 3. Nodular contour of the liver, suggesting underlying cirrhosis. 4. Exophytic isodense lesion in the posterior aspect of the left kidney which is new from the previous exam. Ultrasound is recommended for further characterization. 5. Small left pleural effusion with rounded opacities at the left lung base, possible atelectasis or pneumonia. The possibility of underlying malignancy can not be completely excluded. 6. Aortic atherosclerosis. 7. Remaining findings as described above. Electronically Signed   By: Brett Fairy M.D.   On: 02/09/2022 03:26      Signature  -    Lala Lund M.D on 02/12/2022 at 9:50 AM   -  To page go to [email or web address]

## 2022-02-12 NOTE — Consult Note (Addendum)
[facility] Face-to-Face Psychiatry Consult   Reason for Consult:  SI Referring Physician:  No ref. provider found  Dr. Heidi Dach Patient Identification: Jesse [name] MRN:  [id number] Principal Diagnosis: SBO (small bowel obstruction) ([place]) Diagnosis:  Principal Problem:   SBO (small bowel obstruction) ([place]) Active Problems:   Type 2 diabetes mellitus ([place])   Ventral hernia   OSA (obstructive sleep apnea)   Obesity, Class III, BMI 40-49.9 (morbid obesity) ([place])   Chronic systolic congestive heart failure ([place])   Essential hypertension   Wheelchair dependent   CKD (chronic kidney disease) stage 4, GFR 15-29 ml/min (HCC) - baseline SCr 2.2-2.5   Total Time spent with patient: 45 minutes  Subjective/ID:   Jesse [name] is a 71 y.o. 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 Psychiatry was consulted for SI. Jesse [name]  HPI:   According to the note by RN on 12/24   Spoke with Dr. Marlowe Sax concerning patients state of depression and wish to be dead. This RN did not hear the patient voice it but per daughter and her mother , the patient stated his wish to be dead. Daughter was very tearful and explained his past Psych HX. Family has concern of his decline in health. T     (The mother of his daughter, Zachary George is at the bed side. He agrees to proceed with the interview, and asks her to answer questions for him at times due to difficulty in hearing.) He states that he has been doing good.  He admits that he had SI last night.  He did not have  any active plan or intent, he wanted others to do something to him to end his life.  He was feeling very overwhelmed since admission.  He states that he had worsening in his shoulder and neck pain due to the position in the bed.  He was also not been able to take duloxetine for five days.  Medication was restarted this morning, and he is feeling better.  He has been doing very good until this admission except that he has insomnia at times.  He has been seen by his PCP, and has been working on sleep hygiene. He is trying to limit the use of Xanax. He denies any issues with handling colostomy, or denies any nightmares/flashback in relation to his prior surgeries.  He reports good relationship with his daughter. He has been living in a retirement community for 14 months.  He has been doing well there.  He is also active, and he enjoys planning activities at the facility.   The mother of his daughter, Zachary George is at the bedside.  She states that although she was concerned yesterday, she does not have any concern about him anymore.  He has been doing very well since last night through this morning.  She thinks he was also feeling overwhelmed due to "blockage," which reminds him of his prior surgeries (he agrees with this.)  She denies any concern about his mood prior to this admission.  She thinks he is safe to be discharged to the facility when medically appropriate. She  states that they are "anti-guns" and denies his access to guns.   He denies any past/present symptoms of decreased need for sleep, euphoria, increased goal-directed activity.  He denies hallucinations, paranoia.  He denies any feeling confused.   Alcohol-he states that he received treatment for alcohol in 1999.  He has been in sobriety since then [employer] states that it was affecting his health).  He denies craving for the alcohol.   Orientation- alert, oriented. Attentive.   Past Psychiatric History:  In 1999 for alcohol use. No other  psych admission  Not seen by psychiatrist, Managed by his PCP for depression  Psychiatry diagnosis-he states that he has been on antidepressants since 1999, and has never been off of it.  He has been on duloxetine at least for a few years, and finds it helpful.  Past suicide attempt: denies   Risk to Self:  no Risk to Others: no  Prior Inpatient Therapy:  denies Prior Outpatient Therapy:  denies (only sees by his PCP)  Past Medical History:  Past Medical History:  Diagnosis Date   Alcohol use disorder in remission    Anxiety and depression    At high risk for falls    Former cigarette smoker    Generalized anxiety disorder    Hard of hearing    Hyperlipidemia due to type 2 diabetes mellitus ([place])    Hypertension    Morbid obesity ([place])    Neuropathy    O2 dependent    Risk for falls    Sleep apnea    Type 2 diabetes mellitus ([place])     Past Surgical History:  Procedure Laterality Date   APPENDECTOMY  1980   IR THORACENTESIS ASP PLEURAL SPACE W/IMG GUIDE  05/18/2021   IR THORACENTESIS ASP PLEURAL SPACE W/IMG GUIDE  10/06/2021   KNEE SURGERY Right    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   Procedure: PACEMAKER IMPLANT;  Surgeon: Deboraha Sprang, MD;  Location: [facility];  Service: Cardiovascular;  Laterality: N/A;   Family History:  Family History  Problem Relation Age of Onset   Diabetes Brother    Bipolar disorder Daughter    Anxiety disorder Son    Family Psychiatric  History: as above Social History:  Social History   Substance and Sexual Activity  Alcohol Use Not Currently     Social History   Substance and Sexual Activity  Drug Use Never    Social History   Socioeconomic History   Marital status: Divorced    Spouse name: Not on file   Number of children: 3   Years of education: Not on file   Highest education level: Master's degree (e.g., MA, MS, MEng, MEd, MSW, MBA)  Occupational History   Occupation: Retired  Tobacco Use   Smoking status:  Former    Packs/day: 1.00    Types: Cigarettes    Quit date: 1999    Years since quitting: 24.9   Smokeless tobacco: Never  Vaping Use   Vaping Use: Never used  Substance and Sexual Activity   Alcohol use: Not Currently   Drug use: Never   Sexual activity: Not on file  Other Topics Concern   Not on file  Social History Narrative   Tobacco use, amount per day now: 0   Past tobacco use, amount per day: 1 pack   How many years did you use tobacco: 10 last 1999   Alcohol use (drinks per week): 1/5 day last 1999   Diet:  Do you drink/eat things with caffeine: Yes   Marital status:   Divorced                               What year were you married? 1980   Do you live in a house, apartment, assisted living, condo, trailer, etc.?    Is it one or more stories? 1   How many persons live in your home? 1   Do you have pets in your home?( please list) Cat   Highest Level of education completed? Post Grad   Current or past profession: Insurance account manager.   Do you exercise? Little                                 Type and how often?   Do you have a living will? Yes   Do you have a DNR form?        No                           If not, do you want to discuss one?   Do you have signed POA/HPOA forms?   Yes                     If so, please bring to you appointment      Do you have any difficulty bathing or dressing yourself? Yes   Do you have any difficulty preparing food or eating? No   Do you have any difficulty managing your medications? No   Do you have any difficulty managing your finances? No   Do you have any difficulty affording your medications?  No   Social Determinants of Health   Financial Resource Strain: Low Risk  (05/15/2021)   Overall Financial Resource Strain (CARDIA)    Difficulty of Paying Living Expenses: Not hard at all  Food Insecurity: No Food Insecurity (05/15/2021)   Hunger Vital Sign    Worried About Running Out of Food in the Last Year: Never true    Ran Out of  Food in the Last Year: Never true  Transportation Needs: No Transportation Needs (05/15/2021)   PRAPARE - Hydrologist (Medical): No    Lack of Transportation (Non-Medical): No  Physical Activity: Not on file  Stress: Not on file  Social Connections: Not on file   Additional Social History:    Allergies:   Allergies  Allergen Reactions   Tape Rash    If left on for a long time    Labs:  Results for orders placed or performed during the hospital encounter of 02/08/22 (from the past 48 hour(s))  Glucose, capillary     Status: Abnormal   Collection Time: 02/10/22 11:54 AM  Result Value Ref Range   Glucose-Capillary 253 (H) 70 - 99 mg/dL    Comment: Glucose reference range applies only to samples taken after fasting for at least 8 hours.  Glucose, capillary     Status: Abnormal   Collection Time: 02/10/22  3:57 PM  Result Value Ref Range   Glucose-Capillary 223 (H) 70 - 99 mg/dL    Comment: Glucose reference range applies only to samples taken after fasting for at least 8 hours.  Renal function panel     Status: Abnormal   Collection Time: 02/10/22  4:23  PM  Result Value Ref Range   Sodium 138 135 - 145 mmol/L   Potassium 4.3 3.5 - 5.1 mmol/L   Chloride 100 98 - 111 mmol/L   CO2 19 (L) 22 - 32 mmol/L   Glucose, Bld 219 (H) 70 - 99 mg/dL    Comment: Glucose reference range applies only to samples taken after fasting for at least 8 hours.   BUN 75 (H) 8 - 23 mg/dL   Creatinine, Ser 3.92 (H) 0.61 - 1.24 mg/dL   Calcium 8.9 8.9 - 10.3 mg/dL   Phosphorus 4.9 (H) 2.5 - 4.6 mg/dL   Albumin 3.4 (L) 3.5 - 5.0 g/dL   GFR, Estimated 16 (L) >60 mL/min    Comment: (NOTE) Calculated using the CKD-EPI Creatinine Equation (2021)    Anion gap 19 (H) 5 - 15    Comment: Performed at [facility] [address], [place], [place] [address]  Glucose, capillary     Status: Abnormal   Collection Time: 02/10/22  7:25 PM  Result Value Ref Range    Glucose-Capillary 225 (H) 70 - 99 mg/dL    Comment: Glucose reference range applies only to samples taken after fasting for at least 8 hours.  Glucose, capillary     Status: Abnormal   Collection Time: 02/11/22 12:06 AM  Result Value Ref Range   Glucose-Capillary 201 (H) 70 - 99 mg/dL    Comment: Glucose reference range applies only to samples taken after fasting for at least 8 hours.  CBC with Differential/Platelet     Status: Abnormal   Collection Time: 02/11/22  2:09 AM  Result Value Ref Range   WBC 8.6 4.0 - 10.5 K/uL   RBC 4.22 4.22 - 5.81 MIL/uL   Hemoglobin 12.2 (L) 13.0 - 17.0 g/dL   HCT 37.4 (L) 39.0 - 52.0 %   MCV 88.6 80.0 - 100.0 fL   MCH 28.9 26.0 - 34.0 pg   MCHC 32.6 30.0 - 36.0 g/dL   RDW 16.0 (H) 11.5 - 15.5 %   Platelets 308 150 - 400 K/uL   nRBC 0.0 0.0 - 0.2 %   Neutrophils Relative % 70 %   Neutro Abs 6.1 1.7 - 7.7 K/uL   Lymphocytes Relative 8 %   Lymphs Abs 0.7 0.7 - 4.0 K/uL   Monocytes Relative 21 %   Monocytes Absolute 1.9 (H) 0.1 - 1.0 K/uL   Eosinophils Relative 0 %   Eosinophils Absolute 0.0 0.0 - 0.5 K/uL   Basophils Relative 0 %   Basophils Absolute 0.0 0.0 - 0.1 K/uL   Immature Granulocytes 1 %   Abs Immature Granulocytes 0.04 0.00 - 0.07 K/uL    Comment: Performed at [facility], [address], [place], [place] [address]  Brain natriuretic peptide     Status: None   Collection Time: 02/11/22  2:09 AM  Result Value Ref Range   B Natriuretic Peptide 55.5 0.0 - 100.0 pg/mL    Comment: Performed at [facility] [address], [place], [place] [address]  Basic metabolic panel     Status: Abnormal   Collection Time: 02/11/22  2:09 AM  Result Value Ref Range   Sodium 138 135 - 145 mmol/L   Potassium 3.9 3.5 - 5.1 mmol/L   Chloride 101 98 - 111 mmol/L   CO2 19 (L) 22 - 32 mmol/L   Glucose, Bld 175 (H) 70 - 99 mg/dL    Comment: Glucose reference range applies  only to samples taken after fasting for at least 8 hours.   BUN 86 (H) 8  - 23 mg/dL   Creatinine, Ser 4.48 (H) 0.61 - 1.24 mg/dL   Calcium 9.0 8.9 - 10.3 mg/dL   GFR, Estimated 13 (L) >60 mL/min    Comment: (NOTE) Calculated using the CKD-EPI Creatinine Equation (2021)    Anion gap 18 (H) 5 - 15    Comment: Performed at [facility] [address] [place], [place] [address]  Magnesium     Status: Abnormal   Collection Time: 02/11/22  2:09 AM  Result Value Ref Range   Magnesium 2.9 (H) 1.7 - 2.4 mg/dL    Comment: Performed at [facility] [address] [place], [place] [address]  Glucose, capillary     Status: Abnormal   Collection Time: 02/11/22  4:17 AM  Result Value Ref Range   Glucose-Capillary 183 (H) 70 - 99 mg/dL    Comment: Glucose reference range applies only to samples taken after fasting for at least 8 hours.  Glucose, capillary     Status: Abnormal   Collection Time: 02/11/22  7:34 AM  Result Value Ref Range   Glucose-Capillary 176 (H) 70 - 99 mg/dL    Comment: Glucose reference range applies only to samples taken after fasting for at least 8 hours.  Glucose, capillary     Status: Abnormal   Collection Time: 02/11/22 11:51 AM  Result Value Ref Range   Glucose-Capillary 166 (H) 70 - 99 mg/dL    Comment: Glucose reference range applies only to samples taken after fasting for at least 8 hours.  Glucose, capillary     Status: Abnormal   Collection Time: 02/11/22  4:41 PM  Result Value Ref Range   Glucose-Capillary 252 (H) 70 - 99 mg/dL    Comment: Glucose reference range applies only to samples taken after fasting for at least 8 hours.  Glucose, capillary     Status: Abnormal   Collection Time: 02/11/22  9:19 PM  Result Value Ref Range   Glucose-Capillary 226 (H) 70 - 99 mg/dL    Comment: Glucose reference range applies only to samples taken after fasting for at least 8 hours.  Glucose, capillary     Status: Abnormal   Collection Time: 02/12/22 12:26 AM  Result Value Ref Range   Glucose-Capillary 164 (H) 70 - 99 mg/dL     Comment: Glucose reference range applies only to samples taken after fasting for at least 8 hours.  CBC with Differential/Platelet     Status: Abnormal   Collection Time: 02/12/22  2:12 AM  Result Value Ref Range   WBC 7.0 4.0 - 10.5 K/uL   RBC 3.63 (L) 4.22 - 5.81 MIL/uL   Hemoglobin 10.4 (L) 13.0 - 17.0 g/dL   HCT 32.9 (L) 39.0 - 52.0 %   MCV 90.6 80.0 - 100.0 fL   MCH 28.7 26.0 - 34.0 pg   MCHC 31.6 30.0 - 36.0 g/dL   RDW 15.6 (H) 11.5 - 15.5 %   Platelets 274 150 - 400 K/uL   nRBC 0.0 0.0 - 0.2 %   Neutrophils Relative % 71 %   Neutro Abs 5.0 1.7 - 7.7 K/uL   Lymphocytes Relative 9 %   Lymphs Abs 0.6 (L) 0.7 - 4.0 K/uL   Monocytes Relative 19 %   Monocytes Absolute 1.3 (H) 0.1 - 1.0 K/uL   Eosinophils Relative 0 %   Eosinophils Absolute 0.0 0.0 -  0.5 K/uL   Basophils Relative 0 %   Basophils Absolute 0.0 0.0 - 0.1 K/uL   Immature Granulocytes 1 %   Abs Immature Granulocytes 0.05 0.00 - 0.07 K/uL    Comment: Performed at [facility], [place] [address], [place], [place] [address]  Brain natriuretic peptide     Status: None   Collection Time: 02/12/22  2:12 AM  Result Value Ref Range   B Natriuretic Peptide 51.2 0.0 - 100.0 pg/mL    Comment: Performed at [facility] [address], [place], [place] [address]  Basic metabolic panel     Status: Abnormal   Collection Time: 02/12/22  2:12 AM  Result Value Ref Range   Sodium 138 135 - 145 mmol/L   Potassium 3.7 3.5 - 5.1 mmol/L   Chloride 104 98 - 111 mmol/L   CO2 19 (L) 22 - 32 mmol/L   Glucose, Bld 146 (H) 70 - 99 mg/dL    Comment: Glucose reference range applies only to samples taken after fasting for at least 8 hours.   BUN 91 (H) 8 - 23 mg/dL   Creatinine, Ser 3.83 (H) 0.61 - 1.24 mg/dL   Calcium 8.4 (L) 8.9 - 10.3 mg/dL   GFR, Estimated 16 (L) >60 mL/min    Comment: (NOTE) Calculated using the CKD-EPI Creatinine Equation (2021)    Anion gap 15 5 - 15    Comment: Performed at [facility] [address], [place], [place] [address]  Magnesium     Status: Abnormal   Collection Time: 02/12/22  2:12 AM  Result Value Ref Range   Magnesium 2.9 (H) 1.7 - 2.4 mg/dL    Comment: Performed at [facility] [address] [place], [place] [address]  Glucose, capillary     Status: Abnormal   Collection Time: 02/12/22  3:55 AM  Result Value Ref Range   Glucose-Capillary 144 (H) 70 - 99 mg/dL    Comment: Glucose reference range applies only to samples taken after fasting for at least 8 hours.  Glucose, capillary     Status: Abnormal   Collection Time: 02/12/22  8:08 AM  Result Value Ref Range   Glucose-Capillary 135 (H) 70 - 99 mg/dL    Comment: Glucose reference range applies only to samples taken after fasting for at least 8 hours.    Current Facility-Administered Medications  Medication Dose Route Frequency Provider Last Rate Last Admin   alfuzosin (UROXATRAL) 24 hr tablet 10 mg  10 mg Oral Q breakfast Thurnell Lose, MD   10 mg at 02/12/22 9924   ALPRAZolam Duanne Moron) tablet 0.5 mg  0.5 mg Oral BID PRN Thurnell Lose, MD   0.5 mg at 02/10/22 2252   cyclobenzaprine (FLEXERIL) tablet 7.5 mg  7.5 mg Oral TID PRN Thurnell Lose, MD   7.5 mg at 02/12/22 0618   DULoxetine (CYMBALTA) DR capsule 60 mg  60 mg Oral Daily Thurnell Lose, MD   60 mg at 02/12/22 0824   heparin injection 5,000 Units  5,000 Units Subcutaneous Q8H Kristopher Oppenheim, DO   5,000 Units at 02/12/22 2683   hydrALAZINE (APRESOLINE) injection 10 mg  10 mg Intravenous Q6H PRN Thurnell Lose, MD       HYDROmorphone (DILAUDID) injection 0.5-1 mg  0.5-1 mg Intravenous Q2H PRN Kristopher Oppenheim, DO   1 mg at 02/12/22 4196   insulin aspart (novoLOG) injection 0-15 Units  0-15 Units Subcutaneous Q4H Bridgett Larsson,  Eric, DO   2 Units at 02/12/22 0825   insulin glargine-yfgn [place]) injection 25 Units  25 Units Subcutaneous Daily Thurnell Lose, MD   25 Units at 02/12/22 0825   isosorbide mononitrate (IMDUR) 24 hr tablet 60 mg  60 mg Oral  Daily Thurnell Lose, MD   60 mg at 02/12/22 0825   labetalol (NORMODYNE) injection 10 mg  10 mg Intravenous Q4H PRN Kristopher Oppenheim, DO       lactated ringers 1,000 mL with potassium chloride 20 mEq infusion   Intravenous Continuous Thurnell Lose, MD 125 mL/hr at 02/12/22 0739 New Bag at 02/12/22 0739   ondansetron (ZOFRAN) injection 4 mg  4 mg Intravenous Q6H PRN Kristopher Oppenheim, DO   4 mg at 02/12/22 0415   pantoprazole (PROTONIX) injection 40 mg  40 mg Intravenous Q24H Kristopher Oppenheim, DO   40 mg at 02/12/22 0414   phenol (CHLORASEPTIC) mouth spray 2 spray  2 spray Mouth/Throat PRN Kristopher Oppenheim, DO       promethazine (PHENERGAN) 12.5 mg in sodium chloride 0.9 % 50 mL IVPB  12.5 mg Intravenous Q8H PRN Thurnell Lose, MD 200 mL/hr at 02/12/22 0046 12.5 mg at 02/12/22 0046   rosuvastatin (CRESTOR) tablet 10 mg  10 mg Oral Daily Thurnell Lose, MD   10 mg at 02/12/22 0825   topiramate (TOPAMAX) tablet 25 mg  25 mg Oral Daily Thurnell Lose, MD   25 mg at 02/12/22 0825    Musculoskeletal: Strength & Muscle Tone: within normal limits Gait & Station:  lying in the bed Patient leans: N/A            Psychiatric Specialty Exam:  Presentation  General Appearance: Appropriate for Environment  Eye Contact:Fair  Speech:Normal Rate  Speech Volume:Normal  Handedness:No data recorded  Mood and Affect  Mood:-- (good)  Affect:Appropriate (slightly in distress due to pain)   Thought Process  Thought Processes:Coherent; Goal Directed  Descriptions of Associations:No data recorded Orientation:Full (Time, Place and Person)  Thought Content:Logical  History of Schizophrenia/Schizoaffective disorder:No data recorded Duration of Psychotic Symptoms:No data recorded Hallucinations:Hallucinations: None  Ideas of Reference:None  Suicidal Thoughts:Suicidal Thoughts: No  Homicidal Thoughts:Homicidal Thoughts: No   Sensorium  Memory:Immediate  Good  Judgment:Good  Insight:Good   Executive Functions  Concentration:Good  Attention Span:Good  [place] of Knowledge:Good  Language:Good   Psychomotor Activity  Psychomotor Activity:Psychomotor Activity: Normal   Assets  Assets:Desire for Improvement; Social Support   Sleep  Sleep:Sleep: Fair Number of Hours of Sleep: 6   Physical Exam: Physical Exam Vitals reviewed.  HENT:     Head: Normocephalic and atraumatic.  Neurological:     General: No focal deficit present.     Mental Status: He is alert and oriented to person, place, and time. Mental status is at baseline.    Review of Systems  Gastrointestinal:  Positive for abdominal pain.  Musculoskeletal:  Positive for neck pain.  Psychiatric/Behavioral:  Negative for depression, hallucinations, memory loss, substance abuse and suicidal ideas. The patient has insomnia. The patient is not nervous/anxious.   All other systems reviewed and are negative.  Blood pressure (!) 119/58, pulse 85, temperature 97.6 F (36.4 C), temperature source Oral, resp. rate 18, height 6' (1.829 m), weight (!) 163.3 kg, SpO2 98 %. Body mass index is 48.82 kg/m.  Treatment Plan Summary: Jesse [name] is a 71 y.o. 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 Psychiatry was consulted for SI.  # history of depression  Although he admits reporting passive SI last night, he states that it was in the context feeling overwhelmed with his medical condition, which includes his  neck/shoulder pain, not being able to be started on duloxetine until recently and this admission, which reminded him of the previous surgeries.  He adamantly denies any SI, and the mother of his daughter, who is at the bedside also denies any safety concern/discharge to the facility when medically appropriate.  Will continue current dose of duloxetine given he reports benefit for at least a few years at the current dose, although will need close monitoring of the dose given his CKD.  Noted that he denies any significant mood symptoms except insomnia prior to this admission.  He was advised to try tapering off Xanax under the guidance of his primary care provider due to it's risk of fall, dependence/tolerance, respiratory suppression with concomitant use of opioid.  He is amenable to this.  He is also willing to try outpatient therapy.  Will communicate this to the primary team. Based on the assessment as below, he is at low risk to self-harm.  Inpatient psychiatry is not indicated at this time.    Recommendation - Continue duloxetine 60 mg daily  - Continue Xanax 0.5 mg twice a day as needed for anxiety (home meds- he was advised to taper off, working with his primary care provider) - Please consider referral to outpatient therapy   The patient demonstrates the following risk factors for suicide: Chronic risk factors for suicide include: psychiatric disorder of depression and chronic pain. Acute risk factors for suicide include: N/A. Protective factors for this patient include: positive social support, coping skills, and hope for the future. Considering these factors, the overall suicide risk at this point appears to be low. He denies gun access at the facility. Patient is appropriate for outpatient follow up.    Disposition: No evidence of imminent risk to self or others at present.   Patient does not meet criteria for psychiatric inpatient admission.  Thank you for the consult. Psychiatry consult  service will sign off.  Please contact us if any questions or any need for reevaluation in the future.   Norman Clay, MD 02/12/2022 9:38 AM

## 2022-02-12 NOTE — Progress Notes (Signed)
Brief note: UOP improved and Cr down, BUN lagging behind as is expected. Contininue supportive care with IVFs and Foley Bolus prn (gave one this AM) We'll closely follow.  Madelon Lips MD [employer]

## 2022-02-12 NOTE — Progress Notes (Addendum)
Spoke with Dr. Marlowe Sax concerning patients state of depression and wish to be dead. This RN did not hear the patient voice it but per daughter and her mother , the patient stated his wish to be dead. Daughter was very tearful and explained his past Psych HX. Family has concern of his decline in health. This RN spoke in long length about the changes that had occurred within 12 hours of shift change.  Dr. Marlowe Sax was told patient and family member wanted for one of the doctors on board to talk with them concerning POC and possible outcomes. Dr. Marlowe Sax stated she was unable to speak with them at this time . Stated to inform Surgery. Family made aware that Hospitalist Dr. Marlowe Sax would not be available to see tham tonight.

## 2022-02-12 NOTE — Progress Notes (Signed)
RN spoke with Dr. Lavone Neri in regards to NG tube. Per Dr. Lavone Neri to clamp NG tube and give clears

## 2022-02-12 NOTE — Progress Notes (Addendum)
Patient ate all of full liquid lunch. Patient c/o nausea and asked for NG tube to be connected back to low intermittent suction. NG Tube placed back on suction. patient also c/o pain this shift, given PRN pain medication and muscle relaxer. Per patient nothing works for his pain. Family asked to speak to nephrologist several times this shift, notified nephrologist about patient not having much output since beginning of the shift, awaiting reply.

## 2022-02-12 NOTE — Progress Notes (Signed)
Dr. Candiss Norse made aware of abd results. Per Dr. Candiss Norse to decrease IVF rate to 51ml/hr

## 2022-02-12 NOTE — Progress Notes (Signed)
Patient has been having severe Belching throughout the night. States it hurts at times due to the strength of the belch, he also stated he normally takes gas-x form of medication at home.

## 2022-02-13 ENCOUNTER — Other Ambulatory Visit (HOSPITAL_COMMUNITY): Payer: Self-pay

## 2022-02-13 ENCOUNTER — Inpatient Hospital Stay (HOSPITAL_COMMUNITY): Payer: Medicare PPO

## 2022-02-13 DIAGNOSIS — K56609 Unspecified intestinal obstruction, unspecified as to partial versus complete obstruction: Secondary | ICD-10-CM | POA: Diagnosis not present

## 2022-02-13 LAB — BASIC METABOLIC PANEL
Anion gap: 11 (ref 5–15)
BUN: 60 mg/dL — ABNORMAL HIGH (ref 8–23)
CO2: 25 mmol/L (ref 22–32)
Calcium: 8.8 mg/dL — ABNORMAL LOW (ref 8.9–10.3)
Chloride: 100 mmol/L (ref 98–111)
Creatinine, Ser: 2.42 mg/dL — ABNORMAL HIGH (ref 0.61–1.24)
GFR, Estimated: 28 mL/min — ABNORMAL LOW (ref >60)
Glucose, Bld: 165 mg/dL — ABNORMAL HIGH (ref 70–99)
Potassium: 3.6 mmol/L (ref 3.5–5.1)
Sodium: 136 mmol/L (ref 135–145)

## 2022-02-13 LAB — CBC WITH DIFFERENTIAL/PLATELET
Abs Immature Granulocytes: 0.07 10*3/uL (ref 0.00–0.07)
Basophils Absolute: 0 10*3/uL (ref 0.0–0.1)
Basophils Relative: 0 %
Eosinophils Absolute: 0.1 10*3/uL (ref 0.0–0.5)
Eosinophils Relative: 1 %
HCT: 34 % — ABNORMAL LOW (ref 39.0–52.0)
Hemoglobin: 11.1 g/dL — ABNORMAL LOW (ref 13.0–17.0)
Immature Granulocytes: 1 %
Lymphocytes Relative: 8 %
Lymphs Abs: 0.7 10*3/uL (ref 0.7–4.0)
MCH: 29.3 pg (ref 26.0–34.0)
MCHC: 32.6 g/dL (ref 30.0–36.0)
MCV: 89.7 fL (ref 80.0–100.0)
Monocytes Absolute: 1.1 10*3/uL — ABNORMAL HIGH (ref 0.1–1.0)
Monocytes Relative: 14 %
Neutro Abs: 6.2 10*3/uL (ref 1.7–7.7)
Neutrophils Relative %: 76 %
Platelets: 257 10*3/uL (ref 150–400)
RBC: 3.79 MIL/uL — ABNORMAL LOW (ref 4.22–5.81)
RDW: 15.3 % (ref 11.5–15.5)
WBC: 8.1 10*3/uL (ref 4.0–10.5)
nRBC: 0 % (ref 0.0–0.2)

## 2022-02-13 LAB — MAGNESIUM: Magnesium: 2.8 mg/dL — ABNORMAL HIGH (ref 1.7–2.4)

## 2022-02-13 LAB — GLUCOSE, CAPILLARY
Glucose-Capillary: 145 mg/dL — ABNORMAL HIGH (ref 70–99)
Glucose-Capillary: 175 mg/dL — ABNORMAL HIGH (ref 70–99)
Glucose-Capillary: 200 mg/dL — ABNORMAL HIGH (ref 70–99)
Glucose-Capillary: 290 mg/dL — ABNORMAL HIGH (ref 70–99)

## 2022-02-13 LAB — BRAIN NATRIURETIC PEPTIDE: B Natriuretic Peptide: 94.6 pg/mL (ref 0.0–100.0)

## 2022-02-13 MED ORDER — CYCLOBENZAPRINE HCL 10 MG PO TABS
10.0000 mg | ORAL_TABLET | Freq: Three times a day (TID) | ORAL | 0 refills | Status: DC | PRN
  Filled 2022-02-13: qty 15, 5d supply, fill #0

## 2022-02-13 MED ORDER — FUROSEMIDE 40 MG PO TABS: 80.0000 mg | ORAL_TABLET | Freq: Two times a day (BID) | ORAL | Status: DC

## 2022-02-13 MED ORDER — CYCLOBENZAPRINE HCL 10 MG PO TABS
10.0000 mg | ORAL_TABLET | Freq: Three times a day (TID) | ORAL | Status: DC
  Administered 2022-02-13: 10 mg via ORAL
  Filled 2022-02-13 (×2): qty 1

## 2022-02-13 MED ORDER — HYDROCODONE-ACETAMINOPHEN 7.5-325 MG PO TABS: 1.0000 | ORAL_TABLET | Freq: Four times a day (QID) | ORAL | Status: DC | PRN

## 2022-02-13 MED ORDER — ACETAMINOPHEN 325 MG PO TABS
650.0000 mg | ORAL_TABLET | Freq: Four times a day (QID) | ORAL | 0 refills | Status: DC | PRN
  Filled 2022-02-13: qty 20, 3d supply, fill #0

## 2022-02-13 MED ORDER — ONDANSETRON HCL 4 MG PO TABS
4.0000 mg | ORAL_TABLET | Freq: Three times a day (TID) | ORAL | 0 refills | Status: DC | PRN
  Filled 2022-02-13: qty 20, 7d supply, fill #0

## 2022-02-13 MED ORDER — DICLOFENAC SODIUM 1 % EX GEL
2.0000 g | Freq: Four times a day (QID) | CUTANEOUS | 0 refills | Status: DC
  Filled 2022-02-13: qty 100, fill #0

## 2022-02-13 MED ORDER — DICLOFENAC SODIUM 1 % EX GEL
2.0000 g | Freq: Four times a day (QID) | CUTANEOUS | Status: DC
  Administered 2022-02-13: 2 g via TOPICAL
  Filled 2022-02-13: qty 100

## 2022-02-13 MED ORDER — LOPERAMIDE HCL 2 MG PO CAPS
2.0000 mg | ORAL_CAPSULE | Freq: Four times a day (QID) | ORAL | 0 refills | Status: DC | PRN
  Filled 2022-02-13: qty 20, 5d supply, fill #0

## 2022-02-13 MED ORDER — HYDROCODONE-ACETAMINOPHEN 7.5-325 MG PO TABS
1.0000 | ORAL_TABLET | Freq: Two times a day (BID) | ORAL | 0 refills | Status: DC | PRN
  Filled 2022-02-13: qty 5, 3d supply, fill #0

## 2022-02-13 MED ORDER — POTASSIUM CHLORIDE CRYS ER 20 MEQ PO TBCR: 20.0000 meq | EXTENDED_RELEASE_TABLET | Freq: Two times a day (BID) | ORAL | Status: AC

## 2022-02-13 MED ORDER — HYDROCODONE-ACETAMINOPHEN 5-325 MG PO TABS
1.0000 | ORAL_TABLET | Freq: Once | ORAL | Status: AC
  Administered 2022-02-13: 1 via ORAL
  Filled 2022-02-13: qty 1

## 2022-02-13 NOTE — Progress Notes (Signed)
Patient being discharge to home. Family at bedside. Patient and family educated on discharge instructions and discharge medications including follow up appts. Ivx2 taken out. Patient is non tele. All questions answered. Family requests to take patient home. Family has patients home O2 concentrator at bedside. Patient left via wheelchair on 2 L [place] to daughters personal car.

## 2022-02-13 NOTE — Progress Notes (Signed)
Physical Therapy Treatment Patient Details Name: Jesse [name] MRN: [id number] DOB: Jan 03, 1951 Today's Date: 02/13/2022   History of Present Illness 71 y/o male admitted 12/22 with SBO. PMhx: typically WC bound but can walk short distances, morbid obesity, CKD III, chronic hypoxic resp failure on 2L O2, HTN, colectomy and colostomy, neuropathy, R knee surgery, PPM, dCHF, ventral hernia    PT Comments    Patient seen at MD's request due to nursing concern over planned discharge home with HHPT. Apparently pt did well with OT this morning, but when nursing went to help him back to bed, he required 2 person assist. Pt acknowledges this and states that when nursing was helping him, his shoulder was hurting and he didn't want to help. Patient demonstrated bed to recliner and recliner to bed stand-pivot transfers with supervision. Demonstrated sit to supine modified Independent. Wife and 2 daughters present by end  of session and discharge plan discussed. Patient will need transport home as he states he cannot get into his daughter's car with his painful shoulder. Discussed with charge nurse (patient's nurse not available) and messaged MD.     Recommendations for follow up therapy are one component of a multi-disciplinary discharge planning process, led by the attending physician.  Recommendations may be updated based on patient status, additional functional criteria and insurance authorization.  Follow Up Recommendations  Home health PT     Assistance Recommended at Discharge PRN  Patient can return home with the following Assistance with cooking/housework;Assist for transportation   Equipment Recommendations  None recommended by PT    Recommendations for Other Services       Precautions / Restrictions Precautions Precautions: Fall;Other (comment) Precaution Comments: O2 - 2L at baseline Restrictions Weight Bearing Restrictions: No     Mobility  Bed Mobility Overal bed mobility:  Modified Independent             General bed mobility comments: sit to supine with no assist    Transfers Overall transfer level: Needs assistance   Transfers: Sit to/from Stand, Bed to chair/wheelchair/BSC Sit to Stand: Supervision Stand pivot transfers: Supervision         General transfer comment: Pt able to direct proper set up of transfer; chair height is lower than that he uses at home however he needed no physical assist transferring to or from recliner    Ambulation/Gait               General Gait Details: unable at baseline   Stairs             Wheelchair Mobility    Modified Rankin (Stroke Patients Only)       Balance Overall balance assessment: Needs assistance, History of Falls Sitting-balance support: No upper extremity supported, Feet supported Sitting balance-Leahy Scale: Good     Standing balance support: No upper extremity supported Standing balance-Leahy Scale: Fair Standing balance comment: during transfer stands without UE support and pivots                            Cognition Arousal/Alertness: Awake/alert Behavior During Therapy: WFL for tasks assessed/performed Overall Cognitive Status: Within Functional Limits for tasks assessed                                          Exercises      General Comments  General comments (skin integrity, edema, etc.): During session several family members arrived and ultimately wife and 2 daughters present for discussion on how well pt did with PT. Discussed how he would get from hospital to home and pt prefers medical transport--feels with painful shoulder he would have difficulty getting in/out of daughter's car.      Pertinent Vitals/Pain Pain Assessment Pain Assessment: 0-10 Pain Score: 7  Pain Location: R shoulder; tender over AC joint; upper trap and anterior shoulder Pain Descriptors / Indicators: Sharp, Shooting Pain Intervention(s): Limited  activity within patient's tolerance, Monitored during session, Other (comment) (instructed in gentle AROM)    Home Living                          Prior Function            PT Goals (current goals can now be found in the care plan section) Acute Rehab PT Goals Patient Stated Goal: return to apartment PT Goal Formulation: With patient/family Time For Goal Achievement: 02/16/22 Potential to Achieve Goals: Fair Progress towards PT goals: Progressing toward goals    Frequency    Min 2X/week      PT Plan Current plan remains appropriate    Co-evaluation              AM-PAC PT "6 Clicks" Mobility   Outcome Measure  Help needed turning from your back to your side while in a flat bed without using bedrails?: None Help needed moving from lying on your back to sitting on the side of a flat bed without using bedrails?: None Help needed moving to and from a bed to a chair (including a wheelchair)?: None Help needed standing up from a chair using your arms (e.g., wheelchair or bedside chair)?: None Help needed to walk in hospital room?: Total Help needed climbing 3-5 steps with a railing? : Total 6 Click Score: 18    End of Session Equipment Utilized During Treatment: Oxygen Activity Tolerance: Patient tolerated treatment well Patient left: with call bell/phone within reach;with family/visitor present;in bed Nurse Communication: Mobility status PT Visit Diagnosis: Other abnormalities of gait and mobility (R26.89);Muscle weakness (generalized) (M62.81)     Time: 5400-8676 PT Time Calculation (min) (ACUTE ONLY): 22 min  Charges:  $Therapeutic Activity: 8-22 mins                      Arby Barrette, PT Acute Rehabilitation Services  Office [phone]    Rexanne Mano 02/13/2022, 2:31 PM

## 2022-02-13 NOTE — Discharge Instructions (Addendum)
Follow with Primary MD Ngetich, Nelda Bucks, NP and your general surgeon in 7 days   Get CBC, CMP, Magnesium  -  checked next visit with your primary MD   Activity: As tolerated with Full fall precautions use walker/cane & assistance as needed  Disposition ALF  Diet: Soft diet for the next 1 to 2 days then advance as tolerated to regular consistency heart healthy, low carbohydrate diet with 1.5 L fluid restriction per day.  Check CBGs q. [place].  Special Instructions: If you have smoked or chewed Tobacco  in the last 2 yrs please stop smoking, stop any regular Alcohol  and or any Recreational drug use.  On your next visit with your primary care physician please Get Medicines reviewed and adjusted.  Please request your Prim.MD to go over all Hospital Tests and Procedure/Radiological results at the follow up, please get all Hospital records sent to your Prim MD by signing hospital release before you go home.  If you experience worsening of your admission symptoms, develop shortness of breath, life threatening emergency, suicidal or homicidal thoughts you must seek medical attention immediately by calling 911 or calling your MD immediately  if symptoms less severe.  You Must read complete instructions/literature along with all the possible adverse reactions/side effects for all the Medicines you take and that have been prescribed to you. Take any new Medicines after you have completely understood and accpet all the possible adverse reactions/side effects.

## 2022-02-13 NOTE — Progress Notes (Signed)
Foley taken out as per order. Patient tolerated procedure well.

## 2022-02-13 NOTE — Evaluation (Addendum)
Occupational Therapy Evaluation Patient Details Name: Jesse [name] MRN: [id number] DOB: May 14, 1950 Today's Date: 02/13/2022   History of Present Illness 71 y/o male admitted 12/22 with SBO. PMhx: typically WC bound but can walk short distances, morbid obesity, CKD III, chronic hypoxic resp failure on 2L O2, HTN, colectomy and colostomy, neuropathy, R knee surgery, PPM, dCHF, ventral hernia   Clinical Impression   At baseline pt lives in an [place] apt at [facility] and is very active with his community @ wc level. Pt demonstrates generalized weakness and increased pain R shoulder however is able to demonstrate the ability to DC back to his apt with HHOT/PT. R shoulder primarily his limiting factor at this time as noted below - discussed with MD. If R shoulder pain does not improve, recommend follow up with his orthopedic MD. Acute OT will follow.  Addendum: spoke with nsg @1155 . Pt having more difficulty with mobilizing after pain meds.  Spoke with pt during earlier session about having more care/hiring private duty assistance for bathing/dressing to reduce risk of falls. Pt demonstrates overall weakness. Pain meds in combination with this weakness most likely are contributing to his increased difficulty with mobility this pm. If he is not able to demonstrate the ability to mobilize and complete ADL tasks with staff after taking pain meds, which he will be taking at home, he will need private duty care to assist with ADL and mobility or DC to SNF for rehab.    Recommendations for follow up therapy are one component of a multi-disciplinary discharge planning process, led by the attending physician.  Recommendations may be updated based on patient status, additional functional criteria and insurance authorization.   Follow Up Recommendations  Home health OT (if frequent assistance available; SNF for rehab if not)    Assistance Recommended at Discharge Frequent or constant   Patient can return home with the following Help with stairs or ramp for entrance    Functional Status Assessment  Patient has had a recent decline in their functional status and demonstrates the ability to make significant improvements in function in a reasonable and predictable amount of time.  Equipment Recommendations  None recommended by OT    Recommendations for Other Services       Precautions / Restrictions Precautions Precautions: Fall;Other (comment) Precaution Comments: O2 - 2L at baseline      Mobility Bed Mobility               General bed mobility comments: OOB in chair    Transfers Overall transfer level: Needs assistance   Transfers: Sit to/from Stand, Bed to chair/wheelchair/BSC Sit to Squat: Supervision Squat pivot transfers: Min guard         General transfer comment: Pt able to direct proper set up of transfer; chair height is lower than that he uses at home therefore minguard assist needed to help with pivot transfer      Balance Overall balance assessment: Needs assistance, History of Falls Sitting-balance support: No upper extremity supported, Feet supported Sitting balance-Leahy Scale: Good     Standing balance support: Reliant on assistive device for balance Standing balance-Leahy Scale: Poor                             ADL either performed or assessed with clinical judgement   ADL Overall ADL's : Needs assistance/impaired     Grooming: Set up   Upper Body Bathing: Minimal assistance  Lower Body Bathing: Minimal assistance   Upper Body Dressing : Minimal assistance   Lower Body Dressing: Moderate assistance   Toilet Transfer: Minguardassistance   Toileting- Clothing Manipulation and Hygiene: Minimal assistance  Educated to dress RUE first due to pain - may benefit form a dressing stick     Functional mobility during ADLs: Minimal assistance General ADL Comments: REviewed compensatory strategies for  dressing and available AE/dressing stick to assist if needed      Vision         Perception     Praxis      Pertinent Vitals/Pain Pain Assessment Pain Assessment: 0-10 Pain Score: 7  Pain Location: R shoulder; tender over AC joint; upper trap and anterior shoulder Pain Descriptors / Indicators: Sharp, Shooting Pain Intervention(s): Limited activity within patient's tolerance     Hand Dominance Right   Extremity/Trunk Assessment Upper Extremity Assessment Upper Extremity Assessment: RUE deficits/detail RUE Deficits / Details: increased pain R shoulder; tender over AC joint ( pt states this is worse since they "handled him" at the hospital - may benefit from x-ray; A/AAROM overall WFL and pt is able to hold arm at 90 degrees flex/abduction; + impingement test for supraspinatus tendonitis; several trigger points upper traps; rounded shoulders, limited cervical ROM; decreased shoulder girdle rythm RUE Coordination: decreased gross motor   Lower Extremity Assessment Lower Extremity Assessment: Defer to PT evaluation   Cervical / Trunk Assessment Cervical / Trunk Assessment: Kyphotic;Other exceptions Cervical / Trunk Exceptions: increased body habitus, large hernia   Communication Communication Communication: No difficulties   Cognition Arousal/Alertness: Awake/alert Behavior During Therapy: WFL for tasks assessed/performed Overall Cognitive Status: Within Functional Limits for tasks assessed                                 General Comments: "a little sleepy"; pain medicine making pt sleepy     General Comments       Exercises Exercises: Other exercises Other Exercises Other Exercises: upper trap stretch Other Exercises: cercial retraciton Other Exercises: scapular elevation/dression Other Exercises: scapular retraction Other Exercises: Shoulder A/AAROM   Shoulder Instructions      Home Living Family/patient expects to be discharged to:: Private  residence Living Arrangements: Alone Available Help at Discharge: Available PRN/intermittently Type of Home: Independent living facility Home Access: Level entry     Home Layout: One level     Bathroom Shower/Tub: Occupational psychologist: Handicapped height Bathroom Accessibility: Yes How Accessible: Accessible via wheelchair Home Equipment: Tub bench;Grab bars - tub/shower;Grab bars - toilet;Hand held shower head;Rollator (4 wheels);Wheelchair - power;Adaptive equipment;Hospital bed   Additional Comments: daughter is an OT      Prior Functioning/Environment Prior Level of Function : Independent/Modified Independent             Mobility Comments: power chair, stands with therapy, no longer walks however was working on walking @ 60 ft in order to go to a family Christmas event ADLs Comments: independent, staff performs cooking and cleaning        OT Problem List: Decreased strength;Decreased range of motion;Impaired balance (sitting and/or standing);Obesity;Impaired UE functional use;Pain      OT Treatment/Interventions: Self-care/ADL training;Therapeutic exercise;DME and/or AE instruction;Therapeutic activities;Patient/family education    OT Goals(Current goals can be found in the care plan section) Acute Rehab OT Goals Patient Stated Goal: home today OT Goal Formulation: With patient Time For Goal Achievement: 02/27/22 Potential to Achieve Goals:  Good ADL Goals Pt Will Perform Upper Body Dressing: with modified independence;sitting Pt/caregiver will Perform Home Exercise Program: Increased ROM;Right Upper extremity;Independently;With written HEP provided  OT Frequency: Min 2X/week    Co-evaluation              AM-PAC OT "6 Clicks" Daily Activity     Outcome Measure Help from another person eating meals?: None Help from another person taking care of personal grooming?: None Help from another person toileting, which includes using toliet, bedpan, or  urinal?: A Little Help from another person bathing (including washing, rinsing, drying)?: A Little Help from another person to put on and taking off regular upper body clothing?: A Little Help from another person to put on and taking off regular lower body clothing?: A Little 6 Click Score: 20   End of Session Equipment Utilized During Treatment: Gait belt;Oxygen (2L) Nurse Communication: Other (comment) (pt wanting breakfast)  Activity Tolerance: Patient tolerated treatment well Patient left: in chair;with call bell/phone within reach  OT Visit Diagnosis: Other abnormalities of gait and mobility (R26.89);Muscle weakness (generalized) (M62.81);Pain Pain - Right/Left: Right Pain - part of body: Shoulder                Time: 4332-9518 OT Time Calculation (min): 14 min Charges:  OT General Charges $OT Visit: 1 Visit OT Evaluation $OT Eval Moderate Complexity: 1 Mod OT Treatments $Therapeutic Activity: 8-22 mins $Therapeutic Exercise: 8-22 mins  Maurie Boettcher, OT/L   Acute OT Clinical Specialist [place] Pager [phone] Office [phone]   [facility] 02/13/2022, 11:57 AM

## 2022-02-13 NOTE — Progress Notes (Signed)
[facility] Progress Note   Assessment/ Plan:    AKI on CKD 3b: likely in setting of hypovolemic injury with SBO.  Also had contrast 02/09/22.  Baseline appears to be ~2mg /dL.  Improved from peak Cr on 12/24 4.5 to 2.4 today with normal UOP             - off IVF now - make sure can maintain hydration prior to d/c             - has foley for accurate I/Os - TOV prior to d/c as he's had it for a few days but has no h/o retention             - deferred initially on renal US and bladder scans d/t likelihood of very difficult imaging and now given rapid improvement will not pursue               2.  SBO:              - has a large complicated ventral hernia             - NGT out now and clinically improving             - surgery following   3.  CHF:                       - held Lasix/ losartan/ jardiance d/t AKI/risk for hypovolemia but should resume at d/c give back to baseline as long as tolerating po intake and close f/u   4.  DM II             - per primary   5.  HTN             - started on clonidine patch here while NPO - ok to resume home meds when taking po   Dispo: tells me for d/c today - I'm ok w it as long as tolerating good po intake.  OK to resume home meds at d/c given kidney function nearly back to baseline.  Should see PCP in a week or so to check labs and BP.  Can see outpt nephrologist in about 4 weeks - informed pt to make appt.  Will sign off, please contact me with questions/concerns.     Subjective:    I/Os yest 1.4 / 1.95 with UOP 1.75, net neg 10.5L for admission - mainly GI outpt which has slowed considerable.   NGT was clamped yesterday and fell out overnight. Rapid improvement in kidney function with Cr 4.5 > 3.8 > 2.4.  Says for d/c today.     Objective:   BP 125/82 (BP Location: Right Arm)   Pulse 79   Temp 98 F (36.7 C)   Resp 17   Ht 6' (1.829 m)   Wt (!) 163.3 kg   SpO2 98%   BMI 48.82 kg/m   Intake/Output Summary (Last 24 hours)  at 02/13/2022 0824 Last data filed at 02/13/2022 0600 Gross per 24 hour  Intake 1363.58 ml  Output 1950 ml  Net -586.42 ml    Weight change:   Physical Exam: GEN NAD, sitting in bedside chair HEENT EOMI PERRL, + NGT out NECK no JVD PULM clear anteriorly CV RRR ABD + multiple abd wall hernias, very few bowel sounds, can see occasional peristalsis through hernia sacs.  + ostomy with more output EXT trace LE edema NEURO AAO x 3  Imaging: DG Abd  Portable 1V  Result Date: 02/12/2022 CLINICAL DATA:  Small-bowel obstruction, enteric catheter placement EXAM: PORTABLE ABDOMEN - 1 VIEW COMPARISON:  02/12/2022 at 7:46 a.m. FINDINGS: There are 5 supine frontal views of the lower chest and abdomen obtained. Evaluation is limited by portable technique and body habitus. Enteric catheter passes below diaphragm, tip and side port projecting over the gastric fundus. No change in gaseous distention of the bowel within the left hemiabdomen consistent with obstruction. There is persistent gaseous distension of a portion of the stomach protruding through a large ventral hernia as seen on prior CT. Stable left basilar consolidation and effusion. IMPRESSION: 1. Enteric catheter tip and side port projecting over the gastric fundus. 2. Stable gaseous distension of the portion of the stomach protruding through a large ventral hernia as seen on prior CT. 3. Stable gaseous distention of the small bowel consistent with obstruction. Electronically Signed   By: Randa Ngo M.D.   On: 02/12/2022 18:09   DG Abd Portable 1V  Result Date: 02/12/2022 CLINICAL DATA:  Small-bowel obstruction, verify NG tube placement EXAM: PORTABLE ABDOMEN - 1 VIEW COMPARISON:  Radiograph 02/11/2022 FINDINGS: Nasogastric tube tip and side port overlie the stomach. There is persistent bowel and stomach dilation, not significantly changed from prior. Note that a portion of the herniated stomach remains distended. Persistent small left  pleural effusion adjacent atelectasis. IMPRESSION: Nasogastric tube tip and side port overlie the proximal stomach, with persistent distension of the more distal herniated stomach and persistent small bowel dilation compatible with obstruction. Persistent small left pleural effusion and adjacent atelectasis. Electronically Signed   By: Maurine Simmering M.D.   On: 02/12/2022 08:31    Labs: BMET Recent Labs  Lab 02/09/22 0039 02/09/22 0452 02/10/22 0211 02/10/22 1623 02/11/22 0209 02/12/22 0212 02/13/22 0302  NA 135 134* 134* 138 138 138 136  K 3.4* 3.5 4.0 4.3 3.9 3.7 3.6  CL 96* 94* 97* 100 101 104 100  CO2 26 25 23  19* 19* 19* 25  GLUCOSE 270* 316* 326* 219* 175* 146* 165*  BUN 34* 37* 61* 75* 86* 91* 60*  CREATININE 2.22* 2.34* 3.18* 3.92* 4.48* 3.83* 2.42*  CALCIUM 9.1 9.3 8.9 8.9 9.0 8.4* 8.8*  PHOS  --   --   --  4.9*  --   --   --     CBC Recent Labs  Lab 02/10/22 0211 02/11/22 0209 02/12/22 0212 02/13/22 0302  WBC 5.7 8.6 7.0 8.1  NEUTROABS 4.0 6.1 5.0 6.2  HGB 11.6* 12.2* 10.4* 11.1*  HCT 35.3* 37.4* 32.9* 34.0*  MCV 87.8 88.6 90.6 89.7  PLT 275 308 274 257     Medications:     alfuzosin  10 mg Oral Q breakfast   cyclobenzaprine  10 mg Oral TID   diclofenac Sodium  2 g Topical QID   DULoxetine  60 mg Oral Daily   heparin  5,000 Units Subcutaneous Q8H   HYDROcodone-acetaminophen  1 tablet Oral Once   insulin aspart  0-15 Units Subcutaneous Q4H   insulin glargine-yfgn  25 Units Subcutaneous Daily   isosorbide mononitrate  60 mg Oral Daily   pantoprazole (PROTONIX) IV  40 mg Intravenous Q24H   rosuvastatin  10 mg Oral Daily   topiramate  25 mg Oral Daily   Jannifer Hick MD [facility] Pager [phone]

## 2022-02-13 NOTE — Progress Notes (Signed)
Progress Note     Subjective: Pt reports increase in ostomy output overnight. Denies abdominal pain or nausea or vomiting this AM. Has been taking in clears. Asking about discharge home this AM. I informed patient that he would need to be medically cleared for discharge. If discharging today, discussed with patient that I would recommend FLD at home for a few days with gradual advance to low fiber diet and he verbalized understanding of this.   Objective: Vital signs in last 24 hours: Temp:  [97.4 F (36.3 C)-98 F (36.7 C)] 97.7 F (36.5 C) (12/26 0843) Pulse Rate:  [77-89] 89 (12/26 0843) Resp:  [16-18] 17 (12/26 0843) BP: (107-129)/(54-82) 129/58 (12/26 0843) SpO2:  [97 %-100 %] 100 % (12/26 0843) Last BM Date : 02/12/22  Intake/Output from previous day: 12/25 0701 - 12/26 0700 In: 1363.6 [I.V.:1313.6; IV Piggyback:50] Out: 1950 [Urine:1750; Stool:200] Intake/Output this shift: No intake/output data recorded.  PE: General: pleasant, WD, obese male who is laying in bed in NAD Lungs:  Respiratory effort nonlabored Abd: soft, NT, large hernias are soft, ostomy with stool and gas in bag   Lab Results:  Recent Labs    02/12/22 0212 02/13/22 0302  WBC 7.0 8.1  HGB 10.4* 11.1*  HCT 32.9* 34.0*  PLT 274 257   BMET Recent Labs    02/12/22 0212 02/13/22 0302  NA 138 136  K 3.7 3.6  CL 104 100  CO2 19* 25  GLUCOSE 146* 165*  BUN 91* 60*  CREATININE 3.83* 2.42*  CALCIUM 8.4* 8.8*   PT/INR No results for input(s): "LABPROT", "INR" in the last 72 hours. CMP     Component Value Date/Time   NA 136 02/13/2022 0302   NA 139 12/29/2021 0000   K 3.6 02/13/2022 0302   CL 100 02/13/2022 0302   CO2 25 02/13/2022 0302   GLUCOSE 165 (H) 02/13/2022 0302   BUN 60 (H) 02/13/2022 0302   BUN 51 (A) 12/29/2021 0000   CREATININE 2.42 (H) 02/13/2022 0302   CREATININE 2.11 (H) 05/23/2021 1540   CALCIUM 8.8 (L) 02/13/2022 0302   PROT 7.5 02/09/2022 0452   ALBUMIN 3.4 (L)  02/10/2022 1623   AST 20 02/09/2022 0452   ALT 14 02/09/2022 0452   ALKPHOS 65 02/09/2022 0452   BILITOT 0.8 02/09/2022 0452   GFRNONAA 28 (L) 02/13/2022 0302   GFRAA (L) 04/08/2008 1240    54        The eGFR has been calculated using the MDRD equation. This calculation has not been validated in all clinical situations. eGFR's persistently <60 mL/min signify possible Chronic Kidney Disease.   Lipase     Component Value Date/Time   LIPASE 55 (H) 02/09/2022 0039       Studies/Results: DG Abd Portable 1V  Result Date: 02/12/2022 CLINICAL DATA:  Small-bowel obstruction, enteric catheter placement EXAM: PORTABLE ABDOMEN - 1 VIEW COMPARISON:  02/12/2022 at 7:46 a.m. FINDINGS: There are 5 supine frontal views of the lower chest and abdomen obtained. Evaluation is limited by portable technique and body habitus. Enteric catheter passes below diaphragm, tip and side port projecting over the gastric fundus. No change in gaseous distention of the bowel within the left hemiabdomen consistent with obstruction. There is persistent gaseous distension of a portion of the stomach protruding through a large ventral hernia as seen on prior CT. Stable left basilar consolidation and effusion. IMPRESSION: 1. Enteric catheter tip and side port projecting over the gastric fundus. 2. Stable gaseous distension  of the portion of the stomach protruding through a large ventral hernia as seen on prior CT. 3. Stable gaseous distention of the small bowel consistent with obstruction. Electronically Signed   By: Randa Ngo M.D.   On: 02/12/2022 18:09   DG Abd Portable 1V  Result Date: 02/12/2022 CLINICAL DATA:  Small-bowel obstruction, verify NG tube placement EXAM: PORTABLE ABDOMEN - 1 VIEW COMPARISON:  Radiograph 02/11/2022 FINDINGS: Nasogastric tube tip and side port overlie the stomach. There is persistent bowel and stomach dilation, not significantly changed from prior. Note that a portion of the herniated  stomach remains distended. Persistent small left pleural effusion adjacent atelectasis. IMPRESSION: Nasogastric tube tip and side port overlie the proximal stomach, with persistent distension of the more distal herniated stomach and persistent small bowel dilation compatible with obstruction. Persistent small left pleural effusion and adjacent atelectasis. Electronically Signed   By: Maurine Simmering M.D.   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 Faythe Ghee in [facility], he was left open and went back for washout and closure the following day - SBO resolving. All contrast went through and out.  - NGT out and pt tolerated CLD overnight - having ostomy output - ok to advance to FLD - can continue this for a few days at home from a surgical standpoint and gradually advance to low fiber diet   Suicidal ideation - Psychiatry consulted and have cleared patient      ID - none VTE - sq heparin FEN - IVF, FLD   Recent TIA vs CVA 10/2021 on plavix (last dose 12/21 in AM) DM HLD OSA Chronic respiratory failure on home O2 Morbid obesity BMI 48.82 SSS s/p PPM CHF CAD with h/o cardiac stents HTN CKD-IV    LOS: 4 days   I reviewed hospitalist notes, last 24 h vitals and pain scores, last 48 h intake and output, last 24 h labs and trends, and last 24 h imaging results.     Norm Parcel, [facility] 02/13/2022, 9:12 AM Please see Amion for pager number during day hours 7:00am-4:30pm

## 2022-02-13 NOTE — TOC Initial Note (Signed)
Transition of Care [facility]) - Initial/Assessment Note    Patient Details  Name: Jesse [name] MRN: [id number] Date of Birth: Mar 24, 1950  Transition of [facility]) CM/SW Contact:    Cyndi Bender, RN Phone Number: 02/13/2022, 1:37 PM  Clinical Narrative:                 Spoke to patient's friend, Michaeline, regarding transition needs. Patient lives in retirement home at [facility]. Patient was active with [facility]. Cory with [facility] aware of discharge. New HH orders placed. Daughter will bring portable tank and wheelchair.  No other TOC needs at this time.  Expected Discharge Plan: [place] Barriers to Discharge: Barriers Resolved   Patient Goals and CMS Choice Patient states their goals for this hospitalization and ongoing recovery are:: return to retirement home CMS Medicare.gov Compare Post Acute Care list provided to:: Patient Choice offered to / list presented to : Patient      Expected Discharge Plan and Services   Discharge Planning Services: CM Consult Post Acute Care Choice: [place] arrangements for the past 2 months:  (retirement home) Expected Discharge Date: 02/13/22                         HH Arranged: RN, PT, OT, Nurse's Aide HH Agency: [facility] Agency Contacted: 02/13/22 Time HH Agency Contacted: 42 Representative spoke with at [place]: Tommi Rumps  Prior Living Arrangements/Services Living arrangements for the past 2 months:  (retirement home) Lives with:: Self Patient language and need for interpreter reviewed:: Yes Do you feel safe going back to the place where you live?: Yes      Need for Family Participation in Patient Care: Yes (Comment) Care giver support system in place?: Yes (comment)   Criminal Activity/Legal Involvement Pertinent to Current Situation/Hospitalization: No - Comment as needed  Activities of Daily Living      Permission Sought/Granted                   Emotional Assessment         Alcohol / Substance Use: Not Applicable Psych Involvement: No (comment)  Admission diagnosis:  SBO (small bowel obstruction) ([place]) [K56.609] Patient Active Problem List   Diagnosis Date Noted   Wheelchair dependent 02/09/2022   SBO (small bowel obstruction) ([place]) 02/09/2022   CKD (chronic kidney disease) stage 4, GFR 15-29 ml/min (HCC) - baseline SCr 2.2-2.5 02/09/2022   right facial numbness 11/01/2021   Essential hypertension 11/01/2021   Hyperlipidemia 11/01/2021   Colostomy care ([place]) 11/01/2021   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  09/15/2021   Bradycardia 09/01/2021   Benign hypertension with CKD (chronic kidney disease) stage III ([place]) 78/24/2353   Chronic systolic congestive heart failure ([place]) 06/18/2021   Generalized anxiety disorder 06/18/2021   Type 2 diabetes mellitus with hyperlipidemia ([place]) 05/14/2021   Chronic anemia 05/14/2021   OSA (obstructive sleep apnea) 05/13/2021   Chronic respiratory failure with hypoxia ([place]) 05/13/2021   Obesity, Class III, BMI 40-49.9 (morbid obesity) ([place]) 05/13/2021   Chronic pain of both knees 12/08/2020   Anxiety 12/08/2020   Type 2 diabetes mellitus ([place])    Former cigarette smoker    Alcohol use disorder in remission    Hard of hearing    Abscess 04/01/2015   Anemia 03/07/2015   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 [address] Phone: [phone] Fax: [phone]     Social Determinants of Health (SDOH) Social History: SDOH Screenings   Food Insecurity: No Food Insecurity (05/15/2021)   Housing: Low Risk  (05/15/2021)  Transportation Needs: No Transportation Needs (05/15/2021)  Alcohol Screen: Low Risk  (05/15/2021)  Financial Resource Strain: Low Risk  (05/15/2021)  Tobacco Use: Medium Risk (02/09/2022)   SDOH Interventions:     Readmission Risk Interventions    05/15/2021    2:21 PM  Readmission Risk Prevention Plan  Transportation Screening Complete  PCP or Specialist Appt within 3-5 Days Complete  HRI or [place] Complete  Social Work Consult for [place] Planning/Counseling Complete  Palliative Care Screening Not Applicable  Medication Review Press photographer) Complete

## 2022-02-13 NOTE — Discharge Summary (Signed)
Jesse [name] AJG:[id number] DOB: [birth year] DOA: 02/08/2022  PCP: [name], NP  Admit date: 02/08/2022  Discharge date: 02/13/2022  Admitted From: ALF   Disposition:  ALF   Recommendations for Outpatient Follow-up:   Follow up with PCP in 1-2 weeks  PCP Please obtain BMP/CBC, 2 view CXR in 1week,  (see Discharge instructions)   PCP Please follow up on the following pending results: Monitor blood pressure, renal function and glycemic control closely.  Dedicated outpatient right upper quadrant liver ultrasound. Will benefit from outpatient orthopedics follow-up for ongoing right neck and shoulder discomfort for several months.   Home Health: PT, OT, RN, Aide if qualifies   Equipment/Devices: None  Consultations: [facility]CS Discharge Condition: Stable    CODE STATUS: Full    Diet Recommendation: Soft diet, gradually advance to regular consistency heart healthy low carbohydrate diet as tolerated, 1.5 L fluid restriction per day.      Chief Complaint  Patient presents with   Hernia     Brief history of present illness from the day of admission and additional interim summary    71 year old white male history of multiple bowel surgeries status post colectomy and end ileostomy, type 2 diabetes on insulin, morbid obesity BMI 48, wheelchair-bound, history of pacemaker, history of diastolic heart failure, hypertension, CKD stage IV baseline creatinine 2.2-2.5, chronic ventral hernia presents to the ER today with no ostomy output since the afternoon of 02/08/2022 he was diagnosed in the ER with small bowel obstruction General surgery was consulted and hospitalist team was requested to admit the patient.                                                                  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 Faythe Ghee in [facility], he was left open and went back for washout and closure the following day  - with history of ventral hernia, was treated conservatively and seems to be improving on 02/12/2022, with supportive care his SBO has resolved, tolerating clear liquid diet, per general surgery discharge patient also feeling better wants to be discharged home.  Will be discharged on a soft diet with gradually advance to heart healthy low carbohydrate diet regular consistency over the next few days as tolerated.  Rested to follow-up with PCP and his surgeon within the next 7 to 10 days of discharge.   AKI on CKD (chronic kidney disease) stage 4, GFR 15-29 ml/min (HCC) - baseline SCr 2.2-2.5 - due to SBO and NG tube suction and n.p.o. status he appears dehydrated clinically, urine sodium <10, added IV fluid bolus again on 02/11/2022 and increased maintenance rate, avoid nephrotoxins, and by nephrology  hydrated with improvement in renal function will be discharged home, discontinuing ARB for now upon discharge, skipping diuretics for the next 2 days.  PCP to monitor renal function and blood pressure closely and adjust medications as needed.   Chronic musculoskeletal right neck pain ongoing for 2 to 3 months.  Improved with supportive care, wants a narcotic for a few days along with muscle relaxer upon discharge.  Could have some right shoulder osteoarthritis recommend outpatient orthopedics follow-up to be arranged by PCP.  Depression with questionable suicidal ideation on 02/11/2022 night.  This ideation has resolved he states he feels good.  And currently is not suicidal homicidal, obtain psych input, home antidepressant started with bowel function improving on 02/12/2022.  Cleared by psych for discharge.  Symptoms have resolved.   Wheelchair dependent - Chronic. Lives at [facility] living), PT OT.   Essential hypertension - blood pressure stable resume home regimen except ARB upon discharge.   Chronic systolic congestive heart failure (HCC) - was dehydrated now euvolemic resume home dose Lasix and potassium 2 days from today.  AKI improving.   Obesity, Class III, BMI 40-49.9 (morbid obesity) (HCC) - Chronic. BMI 48, follow-up with PCP for weight loss.   OSA (obstructive sleep apnea) - Pt cannot use CPAP while he has NG tube and SBO.   Ventral hernia - Chronic.   Incidental finding of probably cirrhosis of liver and nonspecific renal findings.  Will need dedicated outpatient renal and liver ultrasound to be done by PCP few weeks after discharge.     Type 2 diabetes mellitus ([place]) -new home regimen requested to check CBGs q. [facility].   Discharge diagnosis     Principal Problem:   SBO (small bowel obstruction) (HCC) Active Problems:   Type 2 diabetes mellitus (HCC)   Ventral hernia   OSA (obstructive sleep apnea)   Obesity, Class III, BMI 40-49.9 (morbid obesity) (HCC)   Chronic systolic congestive heart failure (HCC)   Essential hypertension   Wheelchair dependent   CKD (chronic kidney disease) stage 4, GFR 15-29 ml/min (HCC) - baseline SCr 2.2-2.5    Discharge instructions    Discharge Instructions     Discharge instructions   Complete by: As directed    Follow with Primary MD Ngetich, [name] C, NP and your general surgeon in 7 days   Get CBC, CMP, Magnesium  -  checked next visit with your primary MD   Activity: As tolerated with Full fall precautions use walker/cane & assistance as needed  Disposition ALF  Diet: Soft diet for the next 1 to 2 days then advance as tolerated to regular consistency heart healthy, low carbohydrate diet with 1.5 L fluid restriction per day.  Check CBGs q. [place].  Special Instructions: If you have smoked or chewed Tobacco  in the last 2 yrs please stop smoking, stop any regular Alcohol  and or any  Recreational drug use.  On your next visit with your primary care physician please Get Medicines reviewed and adjusted.  Please request your Prim.MD to go over all Hospital Tests and Procedure/Radiological results at the follow up, please get all Hospital records sent to your Prim MD by signing hospital release before you go home.  If you experience worsening of your admission symptoms, develop shortness of breath, life threatening emergency, suicidal or homicidal thoughts you must seek medical attention immediately by calling 911 or calling your MD immediately  if symptoms less severe.  You Must read complete instructions/literature along with  all the possible adverse reactions/side effects for all the Medicines you take and that have been prescribed to you. Take any new Medicines after you have completely understood and accpet all the possible adverse reactions/side effects.   Increase activity slowly   Complete by: As directed        Discharge Medications   Allergies as of 02/13/2022       Reactions   Tape Rash   If left on for a long time        Medication List     STOP taking these medications    losartan 25 MG tablet Commonly known as: COZAAR       TAKE these medications    acetaminophen 325 MG tablet Commonly known as: TYLENOL Take 2 tablets (650 mg total) by mouth every 6 (six) hours as needed for mild pain or moderate pain.   alfuzosin 10 MG 24 hr tablet Commonly known as: UROXATRAL TAKE 1 TABLET BY MOUTH EVERY DAY What changed: when to take this   ALPRAZolam 0.25 MG tablet Commonly known as: XANAX Take 2 tablets (0.5 mg total) by mouth 2 (two) times daily as needed for anxiety or sleep.   calcium carbonate 500 MG chewable tablet Commonly known as: TUMS - dosed in mg elemental calcium Chew 1 tablet by mouth 2 (two) times daily.   clopidogrel 75 MG tablet Commonly known as: PLAVIX Take 1 tablet (75 mg total) by mouth daily.   cyanocobalamin 1000 MCG  tablet Take 1,000 mcg by mouth daily.   cyclobenzaprine 10 MG tablet Commonly known as: FLEXERIL Take 1 tablet (10 mg total) by mouth 3 (three) times daily as needed for muscle spasms.   diclofenac Sodium 1 % Gel Commonly known as: VOLTAREN Apply 2 g topically 4 (four) times daily. Apply to your right neck and shoulder area 3-4 times a day as needed   DRY EYE RELIEF OP Place 1 drop into both eyes daily as needed (dry eyes).   DULoxetine 60 MG capsule Commonly known as: CYMBALTA Take 1 capsule (60 mg total) by mouth daily.   FeroSul 325 (65 FE) MG tablet Generic drug: ferrous sulfate TAKE 1 TABLET(325 MG) BY MOUTH DAILY WITH BREAKFAST What changed: See the new instructions.   furosemide 40 MG tablet Commonly known as: LASIX Take 2 tablets (80 mg total) by mouth in the morning and at bedtime. Start taking on: February 15, 2022 What changed: These instructions start on February 15, 2022. If you are unsure what to do until then, ask your doctor or other care provider.   glucose blood test strip Use to test blood sugar once daily. Dx:E11.22   HYDROcodone-acetaminophen 7.5-325 MG tablet Commonly known as: NORCO Take 1 tablet by mouth every 12 (twelve) hours as needed for severe pain.   Insulin Pen Needle 32G X 4 MM Misc Use to inject insulin twice daily. Dx: E11.22   isosorbide mononitrate 60 MG 24 hr tablet Commonly known as: IMDUR Take 1 tablet (60 mg total) by mouth daily.   Jardiance 25 MG Tabs tablet Generic drug: empagliflozin TAKE 1 TABLET BY MOUTH EVERY DAY What changed: how much to take   ketoconazole 2 % cream Commonly known as: NIZORAL Apply 1 application topically as needed for irritation.   Lancet Device Misc 1 Device by Does not apply route daily.   Lancets Misc 1 Device by Does not apply route as directed. Use as directed.   Levemir FlexPen 100 UNIT/ML FlexPen Generic drug: insulin detemir Inject  42 Units into the skin at bedtime.   loperamide 2  MG capsule Commonly known as: Imodium A-D Take 1 capsule (2 mg total) by mouth 4 (four) times daily as needed for diarrhea or loose stools. What changed:  medication strength how much to take when to take this reasons to take this   nystatin powder Generic drug: nystatin Apply 1 application  topically daily as needed (Yeast infection to afffected area).   ondansetron 4 MG tablet Commonly known as: Zofran Take 1 tablet (4 mg total) by mouth every 8 (eight) hours as needed for nausea or vomiting.   OVER THE COUNTER MEDICATION Apply 1 Application topically every other day. Head and shoulder medicated shampoo alternate with selsun blue medicated   OXYGEN Inhale 2 L into the lungs continuous.   pantoprazole 40 MG tablet Commonly known as: PROTONIX Take 1 tablet (40 mg total) by mouth daily.   potassium chloride SA 20 MEQ tablet Commonly known as: KLOR-CON M Take 1 tablet (20 mEq total) by mouth 2 (two) times daily. Start taking on: February 15, 2022 What changed: These instructions start on February 15, 2022. If you are unsure what to do until then, ask your doctor or other care provider.   Red Yeast Rice 600 MG Caps Take 1,200 mg by mouth daily.   rosuvastatin 10 MG tablet Commonly known as: CRESTOR TAKE 1 TABLET BY MOUTH EVERY DAY   simethicone 80 MG chewable tablet Commonly known as: MYLICON Chew 80 mg by mouth every 6 (six) hours as needed for flatulence.   topiramate 25 MG tablet Commonly known as: TOPAMAX TAKE 1 TABLET BY MOUTH EVERY DAY   traMADol 50 MG tablet Commonly known as: ULTRAM TAKE 1 TABLET(50 MG) BY MOUTH TWICE DAILY AS NEEDED What changed: See the new instructions.   traZODone 50 MG tablet Commonly known as: DESYREL TAKE 2 TABLETS(100 MG) BY MOUTH AT BEDTIME   triamcinolone cream 0.1 % Commonly known as: KENALOG Apply 1 application. topically 2 (two) times daily. Apply to affected areas on shin area What changed:  when to take this reasons to  take this   TURMERIC PO Take 1,000 mg by mouth daily.   Victoza 18 MG/3ML Sopn Generic drug: liraglutide ADMINISTER 1.8 MG UNDER THE SKIN EVERY DAY What changed: See the new instructions.   Vitamin D3 50 MCG (2000 UT) capsule Take 0.5 capsules (1,000 Units total) by mouth daily.         Follow-up Information     Ngetich, [name] C, NP. Schedule an appointment as soon as possible for a visit in 1 week(s).   Specialty: Family Medicine Why: Also follow-up with your nephrologist and general surgeon within a week. Contact information: [place] [address] [phone]         [facility], [place]. Schedule an appointment as soon as possible for a visit in 1 week(s).   Specialty: General Surgery Contact information: [address] [place] [phone]                Major procedures and Radiology Reports - PLEASE review detailed and final reports thoroughly  -      DG Shoulder Right Port  Result Date: 02/13/2022 CLINICAL DATA:  Right shoulder pain. EXAM: RIGHT SHOULDER - 1 VIEW COMPARISON:  None Available. FINDINGS: Technically suboptimal due to nonstandard positioning. There is no evidence of fracture or dislocation. Probable glenohumeral degenerative spurring noted although the joint space is  not well profiled. No other osseous abnormality identified. IMPRESSION: No acute findings. Glenohumeral osteoarthritis. Electronically Signed   By: Marlaine Hind M.D.   On: 02/13/2022 10:33   DG Abd Portable 1V  Result Date: 02/12/2022 CLINICAL DATA:  Small-bowel obstruction, enteric catheter placement EXAM: PORTABLE ABDOMEN - 1 VIEW COMPARISON:  02/12/2022 at 7:46 a.m. FINDINGS: There are 5 supine frontal views of the lower chest and abdomen obtained. Evaluation is limited by portable technique and body habitus. Enteric catheter passes below diaphragm, tip and side port projecting over the gastric fundus. No  change in gaseous distention of the bowel within the left hemiabdomen consistent with obstruction. There is persistent gaseous distension of a portion of the stomach protruding through a large ventral hernia as seen on prior CT. Stable left basilar consolidation and effusion. IMPRESSION: 1. Enteric catheter tip and side port projecting over the gastric fundus. 2. Stable gaseous distension of the portion of the stomach protruding through a large ventral hernia as seen on prior CT. 3. Stable gaseous distention of the small bowel consistent with obstruction. Electronically Signed   By: Randa Ngo M.D.   On: 02/12/2022 18:09   DG Abd Portable 1V  Result Date: 02/12/2022 CLINICAL DATA:  Small-bowel obstruction, verify NG tube placement EXAM: PORTABLE ABDOMEN - 1 VIEW COMPARISON:  Radiograph 02/11/2022 FINDINGS: Nasogastric tube tip and side port overlie the stomach. There is persistent bowel and stomach dilation, not significantly changed from prior. Note that a portion of the herniated stomach remains distended. Persistent small left pleural effusion adjacent atelectasis. IMPRESSION: Nasogastric tube tip and side port overlie the proximal stomach, with persistent distension of the more distal herniated stomach and persistent small bowel dilation compatible with obstruction. Persistent small left pleural effusion and adjacent atelectasis. Electronically Signed   By: Maurine Simmering M.D.   On: 02/12/2022 08:31   DG Abd Portable 1V  Result Date: 02/11/2022 CLINICAL DATA:  Small bowel obstruction EXAM: PORTABLE ABDOMEN - 1 VIEW COMPARISON:  Radiograph from yesterday and abdominal CT from 2 days prior FINDINGS: Gas distended stomach despite an enteric tube, appearance unchanged from initial imaging this admission. No gas dilated small bowel loops are seen but these loops are mainly fluid-filled on prior CT. Left pleural effusion that is loculated by CT. IMPRESSION: 1. Continued gaseous distension of the stomach  despite enteric tube, is the tube functioning? 2. Small bowel obstruction with mainly fluid-filled loops by CT, no interval change. Electronically Signed   By: Jorje Guild M.D.   On: 02/11/2022 06:45   DG Abd Portable 1V-Small Bowel Obstruction Protocol-initial, 8 hr delay  Result Date: 02/10/2022 CLINICAL DATA:  Small bowel obstruction EXAM: PORTABLE ABDOMEN - 1 VIEW COMPARISON:  None Available. FINDINGS: NG tube tip has been retracted with tip in the proximal stomach and side port near the GE junction. Gaseous distention of the stomach and multiple dilated loops of small bowel, similar to prior exam. No radio-opaque calculi or other significant radiographic abnormality are seen. IMPRESSION: 1. NG tube tip has been retracted with tip in the proximal stomach and side port near the GE junction, recommend advancement. 2. Persistent small bowel obstruction. Electronically Signed   By: Yetta Glassman M.D.   On: 02/10/2022 20:02   DG Abd Portable 1V  Result Date: 02/10/2022 CLINICAL DATA:  Small-bowel obstruction EXAM: PORTABLE ABDOMEN - 1 VIEW COMPARISON:  Radiograph 02/09/2022 FINDINGS: Nasogastric tube tip and side port overlie the stomach. There is persistent gaseous distension and small bowel dilation. IMPRESSION: Nasogastric tube  tip and side port overlie the stomach, which remains distended, correlate with output. Persistent bowel obstruction. Electronically Signed   By: Maurine Simmering M.D.   On: 02/10/2022 08:51   DG Abd Portable 1V  Result Date: 02/09/2022 CLINICAL DATA:  Check gastric catheter placement EXAM: PORTABLE ABDOMEN - 1 VIEW COMPARISON:  None Available. FINDINGS: Gastric catheter is noted with the tip in the stomach although the proximal side port lies in the distal esophagus. This should be advanced deeper into the stomach. No free air is seen. IMPRESSION: Gastric catheter as described which should be advanced several cm deeper into the stomach. Electronically Signed   By: Inez Catalina M.D.   On: 02/09/2022 21:21   DG Abd Portable 1V  Result Date: 02/09/2022 CLINICAL DATA:  Small bowel obstruction EXAM: PORTABLE ABDOMEN - 1 VIEW COMPARISON:  02/09/2022, 9:32 a.m. FINDINGS: Examination is extremely limited by body habitus and severe underpenetration. A previously seen esophagogastric tube has been removed. There remains distended stomach and small bowel throughout the ventral abdomen, not obviously changed compared to prior examination, however assessment is extremely limited. No obvious free air. IMPRESSION: Examination is extremely limited by body habitus and severe underpenetration. A previously seen esophagogastric tube has been removed. There remains distended stomach and small bowel throughout the ventral abdomen, not obviously changed compared to prior examination and concerning for bowel obstruction, however assessment is extremely limited. Electronically Signed   By: Delanna Ahmadi M.D.   On: 02/09/2022 17:35   DG Chest Port 1 View  Result Date: 02/09/2022 CLINICAL DATA:  Shortness of breath EXAM: PORTABLE CHEST 1 VIEW COMPARISON:  Previous studies including the chest radiograph done on 10/06/2021 FINDINGS: Transverse diameter of heart is increased. There are no signs of alveolar pulmonary edema. There is increased density in left lower lung field obscuring the left hemidiaphragm. Pacemaker battery is seen in the left infraclavicular region. Enteric tube is seen in esophagus. Distal end of enteric tube is not adequately visualized to evaluate its location. IMPRESSION: Cardiomegaly. Increased density in left lower lung fields may suggest left pleural effusion and underlying atelectasis/pneumonia. Tip of enteric tube is not adequately visualized and location of the tip could not be evaluated. Electronically Signed   By: Elmer Picker M.D.   On: 02/09/2022 17:33   DG Abd Portable 1V-Small Bowel Protocol-Position Verification  Result Date: 02/09/2022 CLINICAL DATA:   NG tube placed EXAM: PORTABLE ABDOMEN - 1 VIEW COMPARISON:  CT 02/09/2022 FINDINGS: Nasogastric tube tip overlies the stomach, side port near the GE junction. There is marked stomach dilation. Left pleural effusion and adjacent rounded atelectasis, as seen on recent CT. IMPRESSION: Nasogastric tube tip overlies the stomach, side port near the GE junction. Recommend advancement by 7.0 cm. Electronically Signed   By: Maurine Simmering M.D.   On: 02/09/2022 12:50   CT ABDOMEN PELVIS W CONTRAST  Result Date: 02/09/2022 CLINICAL DATA:  Bowel obstruction suspected, history of multiple surgeries. No osteotomy output in 6 hours. Abdominal distention. EXAM: CT ABDOMEN AND PELVIS WITH CONTRAST TECHNIQUE: Multidetector CT imaging of the abdomen and pelvis was performed using the standard protocol following bolus administration of intravenous contrast. RADIATION DOSE REDUCTION: This exam was performed according to the departmental dose-optimization program which includes automated exposure control, adjustment of the mA and/or kV according to patient size and/or use of iterative reconstruction technique. CONTRAST:  24mL OMNIPAQUE IOHEXOL 350 MG/ML SOLN COMPARISON:  03/06/2015. FINDINGS: Lower chest: The heart is enlarged and there is a small pericardial effusion.  Pacemaker leads are noted in the heart. With rounded opacities at the left lung base. Right lung base is clear. Hepatobiliary: The liver has a nodular contour suggesting underlying cirrhosis. The gallbladder is without stones. No biliary ductal dilatation. Pancreas: Fatty atrophy. No pancreatic ductal dilatation or surrounding inflammatory changes. Spleen: Normal in size without focal abnormality. Adrenals/Urinary Tract: No adrenal nodule or mass. There is bilateral renal atrophy with renal cortical thinning hearing. A isoechoic exophytic lesion is present along the posterior aspect of the left kidney measuring 2.5 cm. No renal calculus or hydronephrosis. The bladder is  unremarkable. Stomach/Bowel: The distal esophagus is mildly distended with fluid. There is a large complex ventral abdominal wall hernia containing small bowel, stomach, and a portion of the colon in the midline. The hernia pouch is are not well evaluated and incompletely imaged due to field of view and patient's body habitus. There is dilatation of the proximal small bowel with air-fluid levels measuring up to 4.7 cm to the level of a spigelian hernia on the left. The visualized loops of small bowel in the spigelian hernia pouch are distended measuring 4 cm, however only partially included in the field of view. The remaining loops of small bowel in the abdomen and hernia pouch are decompressed. No free air or pneumatosis. An ostomy is noted in the right lower quadrant. Vascular/Lymphatic: Aortic atherosclerosis. No enlarged abdominal or pelvic lymph nodes. Reproductive: Prostate is unremarkable. Other: No abdominopelvic ascites. Musculoskeletal: Degenerative changes in the thoracolumbar spine. No acute osseous abnormality. IMPRESSION: 1. Distention of the stomach and proximal small bowel measuring up to 4.7 cm to the level of the left spigelian hernia. The small bowel in the spigelian hernia pouch measures up to 4 cm. The remaining distal small-bowel is decompressed. Findings are concerning for small bowel obstruction, however the hernia pouch is only partially visualized on exam due to patient's body habitus. Surgical consultation is recommended. 2. Large ventral abdominal wall hernia containing a portion of the stomach, small bowel, and colon, only partially visualized on exam. 3. Nodular contour of the liver, suggesting underlying cirrhosis. 4. Exophytic isodense lesion in the posterior aspect of the left kidney which is new from the previous exam. Ultrasound is recommended for further characterization. 5. Small left pleural effusion with rounded opacities at the left lung base, possible atelectasis or  pneumonia. The possibility of underlying malignancy can not be completely excluded. 6. Aortic atherosclerosis. 7. Remaining findings as described above. Electronically Signed   By: Brett Fairy M.D.   On: 02/09/2022 03:26    Micro Results    No results found for this or any previous visit (from the past 240 hour(s)).  Today   Subjective    Jesse [name] today has no headache,no chest abdominal pain,no new weakness tingling or numbness, feels much better wants to go home today.  Does have some chronic musculoskeletal right neck and states he has it for the last 2 to 3 months.   Objective   Blood pressure (!) 129/58, pulse 89, temperature 97.7 F (36.5 C), temperature source Oral, resp. rate 17, height 6' (1.829 m), weight (!) 163.3 kg, SpO2 100 %.   Intake/Output Summary (Last 24 hours) at 02/13/2022 1059 Last data filed at 02/13/2022 0600 Gross per 24 hour  Intake 1363.58 ml  Output 1950 ml  Net -586.42 ml    Exam  Awake Alert, No new F.N deficits,    [place].AT,PERRAL Supple Neck,   Symmetrical Chest wall movement, Good air movement bilaterally, CTAB RRR,No  Gallops,   +ve B.Sounds, Abd Soft, Non tender, abdominal ventral hernia decompressed, ostomy has liquid stool, No Cyanosis, Clubbing or edema    Data Review   Recent Labs  Lab 02/09/22 0452 02/10/22 0211 02/11/22 0209 02/12/22 0212 02/13/22 0302  WBC 11.9* 5.7 8.6 7.0 8.1  HGB 11.0* 11.6* 12.2* 10.4* 11.1*  HCT 34.4* 35.3* 37.4* 32.9* 34.0*  PLT 249 275 308 274 257  MCV 90.1 87.8 88.6 90.6 89.7  MCH 28.8 28.9 28.9 28.7 29.3  MCHC 32.0 32.9 32.6 31.6 32.6  RDW 15.5 15.7* 16.0* 15.6* 15.3  LYMPHSABS 0.4* 0.5* 0.7 0.6* 0.7  MONOABS 0.7 1.2* 1.9* 1.3* 1.1*  EOSABS 0.0 0.0 0.0 0.0 0.1  BASOSABS 0.0 0.0 0.0 0.0 0.0    Recent Labs  Lab 02/09/22 0039 02/09/22 0452 02/10/22 0211 02/10/22 1623 02/11/22 0209 02/12/22 0212 02/13/22 0302  NA 135 134* 134* 138 138 138 136  K 3.4* 3.5 4.0 4.3 3.9 3.7 3.6  CL  96* 94* 97* 100 101 104 100  CO2 26 25 23  19* 19* 19* 25  ANIONGAP 13 15 14  19* 18* 15 11  GLUCOSE 270* 316* 326* 219* 175* 146* 165*  BUN 34* 37* 61* 75* 86* 91* 60*  CREATININE 2.22* 2.34* 3.18* 3.92* 4.48* 3.83* 2.42*  AST 20 20  --   --   --   --   --   ALT 15 14  --   --   --   --   --   ALKPHOS 59 65  --   --   --   --   --   BILITOT 0.7 0.8  --   --   --   --   --   ALBUMIN 3.5 3.5  --  3.4*  --   --   --   BNP  --  48.0 82.5  --  55.5 51.2 94.6  MG  --  2.7* 2.8*  --  2.9* 2.9* 2.8*  CALCIUM 9.1 9.3 8.9 8.9 9.0 8.4* 8.8*     Total Time in preparing paper work, data evaluation and todays exam - 35 minutes  Signature  -    Lala Lund M.D on 02/13/2022 at 10:59 AM   -  To page go to [email or web address]

## 2022-02-13 NOTE — Care Management Important Message (Signed)
Important Message  Patient Details  Name: Jesse [name] MRN: [id number] Date of Birth: 1950-04-26   Medicare Important Message Given:  Yes     [name] 02/13/2022, 2:43 PM

## 2022-02-13 NOTE — Progress Notes (Signed)
Occupational Therapy Treatment Note  Pt educated on written [employer] HEP/resource material given and compensatory strategies for managing ADL tasks. Educated on recommendation for following up with orthopedic MD regarding R shoulder. Pt verbalized understanding. Pt reports pain is "better" after pian meds. Will follow.     02/13/22 0900  OT Visit Information  Last OT Received On 02/13/22  Assistance Needed +1  History of Present Illness 71 y/o male admitted 12/22 with SBO. PMhx: typically WC bound but can walk short distances, morbid obesity, CKD III, chronic hypoxic resp failure on 2L O2, HTN, colectomy and colostomy, neuropathy, R knee surgery, PPM, dCHF, ventral hernia  Precautions  Precautions Fall;Other (comment)  Precaution Comments O2 - 2L at baseline  Pain Assessment  Pain Assessment 0-10  Pain Score 7  Pain Location R shoulder; tender over AC joint; upper trap and anterior shoulder  Pain Descriptors / Indicators Sharp;Shooting  Pain Intervention(s) Limited activity within patient's tolerance  Cognition  Arousal/Alertness Awake/alert  Behavior During Therapy WFL for tasks assessed/performed  Overall Cognitive Status Within Functional Limits for tasks assessed  General Comments "a little sleepy"; pain medicine making pt sleepy  ADL  General ADL Comments REviewed compensatory strategies for dressing and available AE/dressing stick to assist if needed  Other Exercises  Other Exercises upper trap stretch  Other Exercises cercial retraciton  Other Exercises scapular elevation/dression  Other Exercises scapular retraction  Other Exercises Shoulder A/AAROM  OT - End of Session  Activity Tolerance Patient tolerated treatment well  Patient left in chair;with call bell/phone within reach  Nurse Communication Other (comment) (pt wanting breakfast)  OT Assessment/Plan  OT Plan Discharge plan remains appropriate  OT Visit Diagnosis Other abnormalities of gait and mobility  (R26.89);Muscle weakness (generalized) (M62.81);Pain  Pain - Right/Left Right  Pain - part of body Shoulder  OT Frequency (ACUTE ONLY) Min 2X/week  Follow Up Recommendations Home health OT  Assistance recommended at discharge Intermittent Supervision/Assistance  Patient can return home with the following Help with stairs or ramp for entrance  OT Equipment None recommended by OT  AM-PAC OT "6 Clicks" Daily Activity Outcome Measure (Version 2)  Help from another person eating meals? 4  Help from another person taking care of personal grooming? 4  Help from another person toileting, which includes using toliet, bedpan, or urinal? 3  Help from another person bathing (including washing, rinsing, drying)? 3  Help from another person to put on and taking off regular upper body clothing? 3  Help from another person to put on and taking off regular lower body clothing? 3  6 Click Score 20  Progressive Mobility  What is the highest level of mobility based on the progressive mobility assessment? Level 3 (Stands with assist) - Balance while standing  and cannot march in place  Mobility Referral Yes  OT Goal Progression  Progress towards OT goals Progressing toward goals  Acute Rehab OT Goals  Patient Stated Goal home today  OT Goal Formulation With patient  Time For Goal Achievement 02/27/22  Potential to Achieve Goals Good  OT Time Calculation  OT Start Time (ACUTE ONLY) 0851  OT Stop Time (ACUTE ONLY) 0905  OT Time Calculation (min) 14 min  OT General Charges  $OT Visit 1 Visit  OT Treatments  $Therapeutic Exercise 8-22 mins   Maurie Boettcher, OT/L   Acute OT Clinical Specialist [place] Pager [phone] Office [phone]

## 2022-02-14 ENCOUNTER — Telehealth: Payer: Self-pay

## 2022-02-14 NOTE — Patient Outreach (Signed)
  Care Coordination TOC Note Transition Care Management Unsuccessful Follow-up Telephone Call  Date of discharge and from where:  02/13/22-[place]  Attempts:  1st Attempt  Reason for unsuccessful TCM follow-up call:  Left voice message      [facility]etty Blend [place] Management Telephonic Care Management Coordinator Direct Phone: [phone] Toll Free: [phone] Fax: [phone]

## 2022-02-15 ENCOUNTER — Telehealth: Payer: Self-pay

## 2022-02-15 DIAGNOSIS — M6281 Muscle weakness (generalized): Secondary | ICD-10-CM | POA: Diagnosis not present

## 2022-02-15 DIAGNOSIS — R2689 Other abnormalities of gait and mobility: Secondary | ICD-10-CM | POA: Diagnosis not present

## 2022-02-15 NOTE — Patient Outreach (Signed)
  Care Coordination TOC Note Transition Care Management Unsuccessful Follow-up Telephone Call  Date of discharge and from where:  02/13/22-[place]  Attempts:  2nd Attempt  Reason for unsuccessful TCM follow-up call:  No answer/busy    Enzo Montgomery, RN,BSN,CCM [facility] Management Telephonic Care Management Coordinator Direct Phone: [phone] Toll Free: [phone] Fax: [phone]

## 2022-02-16 ENCOUNTER — Telehealth: Payer: Self-pay | Admitting: *Deleted

## 2022-02-16 ENCOUNTER — Telehealth: Payer: Self-pay

## 2022-02-16 DIAGNOSIS — M542 Cervicalgia: Secondary | ICD-10-CM | POA: Diagnosis not present

## 2022-02-16 NOTE — Progress Notes (Signed)
  Care Coordination  Note  02/16/2022 Name: Jesse [name] MRN: [id number] DOB: 1950/09/17  Jesse [name] is a 71 y.o. year old primary care patient of Ngetich, Monserrate, NP. I reached out to Maryellen Pile by phone today to assist with scheduling a follow up appointment. Maryellen Pile verbally consented to my assistance.       Follow up plan: Hospital Follow Up appointment scheduled with (Ngetich, [name] C, NP) on (02/20/22) at (1:40 PM).  [place]  Direct Dial: [phone]

## 2022-02-16 NOTE — Patient Outreach (Signed)
  Care Coordination TOC Note Transition Care Management Follow-up Telephone Call Date of discharge and from where: 02/13/22-[facility]  Dx: "SBO" How have you been since you were released from the hospital? Patient reports he is having some ortho pain. He is taking pain meds as ordered and using heating pad. He is going today to see ortho MD at 11am. He reports no issues with colostomy. Appetite remains decreased. Patient reports he has been experiencing nausea. Reviewed med list with patient. Noted that he has not been taking Zofran prn as ordered. He states that ex-wife manages and gives him his meds. He reached out to her while on the phone wit RN CM to confirm he has med ad disused purpose of med. She will give patient a dose of med. Any questions or concerns? No  Items Reviewed: Did the pt receive and understand the discharge instructions provided? Yes  Medications obtained and verified?  Patient stets that his ex-wife manages his meds for him and she is currently not at home Other? Yes  Any new allergies since your discharge? No  Dietary orders reviewed? Yes Do you have support at home? Yes ex-wife  Home Care and Equipment/Supplies: Were home health services ordered? yes If so, what is the name of the agency? [facility]  Has the agency set up a time to come to the patient's home? no Were any new equipment or medical supplies ordered?  No What is the name of the medical supply agency? N/A Were you able to get the supplies/equipment? not applicable Do you have any questions related to the use of the equipment or supplies? No  Functional Questionnaire: (I = Independent and D = Dependent) ADLs: A  Bathing/Dressing- I  Meal Prep- A  Eating- I  Maintaining continence- I  Transferring/Ambulation- I  Managing Meds- A  Follow up appointments reviewed:  PCP Hospital f/u appt confirmed? No  .Patient agreeable to care guide calling to assist with scheduling. [facility] f/u  appt confirmed? Yes  Scheduled to see ortho clinic MD today at [place]. Patient will follow up with [facility] office as to rather or not he really needs to be seen.  Are transportation arrangements needed? No  If their condition worsens, is the pt aware to call PCP or go to the Emergency Dept.? Yes Was the patient provided with contact information for the PCP's office or ED? Yes Was to pt encouraged to call back with questions or concerns? Yes  SDOH assessments and interventions completed:   Yes SDOH Interventions Today    Flowsheet Row Most Recent Value  SDOH Interventions   Food Insecurity Interventions Intervention Not Indicated  Transportation Interventions Intervention Not Indicated       Care Coordination Interventions:  PCP follow up appointment requested RN Cm contacted [name] [phone]). Spoke with Whitney and verified that [facility] ordered received and patient is on schedule for visit from staff tomorrow. Staff will contact patient today to set up visit time. Patient made aware.    Encounter Outcome:  Pt. Visit Completed    Enzo Montgomery, RN,BSN,CCM [place] Management Telephonic Care Management Coordinator Direct Phone: [phone] Toll Free: [phone] Fax: [phone]

## 2022-02-19 DIAGNOSIS — Z981 Arthrodesis status: Secondary | ICD-10-CM | POA: Insufficient documentation

## 2022-02-19 DIAGNOSIS — N183 Chronic kidney disease, stage 3 unspecified: Secondary | ICD-10-CM | POA: Insufficient documentation

## 2022-02-20 ENCOUNTER — Encounter: Payer: Self-pay | Admitting: Family

## 2022-02-20 ENCOUNTER — Other Ambulatory Visit: Payer: Self-pay | Admitting: Family

## 2022-02-20 ENCOUNTER — Ambulatory Visit (INDEPENDENT_AMBULATORY_CARE_PROVIDER_SITE_OTHER): Payer: Medicare PPO | Admitting: Family

## 2022-02-20 VITALS — BP 118/70 | HR 63 | Temp 97.5°F | Resp 18 | Ht 72.0 in

## 2022-02-20 DIAGNOSIS — M25511 Pain in right shoulder: Secondary | ICD-10-CM | POA: Diagnosis not present

## 2022-02-20 DIAGNOSIS — K56609 Unspecified intestinal obstruction, unspecified as to partial versus complete obstruction: Secondary | ICD-10-CM

## 2022-02-20 DIAGNOSIS — N183 Chronic kidney disease, stage 3 unspecified: Secondary | ICD-10-CM

## 2022-02-20 DIAGNOSIS — K7460 Unspecified cirrhosis of liver: Secondary | ICD-10-CM | POA: Diagnosis not present

## 2022-02-20 DIAGNOSIS — M542 Cervicalgia: Secondary | ICD-10-CM

## 2022-02-20 DIAGNOSIS — Z933 Colostomy status: Secondary | ICD-10-CM | POA: Diagnosis not present

## 2022-02-20 DIAGNOSIS — I129 Hypertensive chronic kidney disease with stage 1 through stage 4 chronic kidney disease, or unspecified chronic kidney disease: Secondary | ICD-10-CM

## 2022-02-20 DIAGNOSIS — G2581 Restless legs syndrome: Secondary | ICD-10-CM

## 2022-02-20 DIAGNOSIS — E1122 Type 2 diabetes mellitus with diabetic chronic kidney disease: Secondary | ICD-10-CM

## 2022-02-20 MED ORDER — ROPINIROLE HCL 2 MG PO TABS: 2.0000 mg | ORAL_TABLET | Freq: Every day | ORAL | 1 refills | Status: DC

## 2022-02-20 MED ORDER — HYDROCODONE-ACETAMINOPHEN 7.5-325 MG PO TABS: 1.0000 | ORAL_TABLET | Freq: Two times a day (BID) | ORAL | 0 refills | Status: DC | PRN

## 2022-02-20 NOTE — Patient Instructions (Signed)
-   Hold Tramadol while taking Hydrocodone

## 2022-02-20 NOTE — Progress Notes (Signed)
Provider: Marlowe Sax FNP-C   Zebbie Ace, Nelda Bucks, NP  Patient Care Team: Mekhi Lascola, Nelda Bucks, NP as PCP - General (Family Medicine) Janina Mayo, MD as PCP - Cardiology (Cardiology) Deboraha Sprang, MD as PCP - Electrophysiology (Cardiology)  Extended Emergency Contact Information Primary Emergency Contact: Reuben Likes Address: [address]          [place], [place] [address] Johnnette Litter of [place] Phone: [phone] Relation: Friend Secondary Emergency Contact: [place] [employer] Phone: [phone] Relation: Daughter Preferred language: English Interpreter needed? No  Code Status:  Full Code  Goals of care: Advanced Directive information    02/20/2022    1:51 PM  Advanced Directives  Does Patient Have a Medical Advance Directive? Yes  Type of Paramedic of [place];Living will  Does patient want to make changes to medical advance directive? No - Patient declined  Copy of [place] in Chart? Yes - validated most recent copy scanned in chart (See row information)     Chief Complaint  Patient presents with   Transitions Of Care    [facility] 02/08/2022-02/13/2022    HPI:  Pt is a 72 y.o. 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  He presented to the ER with no ostomy output.  General surgery was consulted and was admitted.  Small bowel obstruction resolved with supportive care.  Diet was advanced as tolerated.  He was discharged on soft diet to advance to heart healthy low carbohydrate as  tolerated then follow-up with his surgeon on outpatient.  Also had acute on chronic kidney disease stage IV thought due to small bowel obstruction and NG tube suction and n.p.o. IV fluid bolus was given with improvement of renal function.His ARB was discontinued  and diuretic held for 2 days.  CT scan of the abdomen done on 02/09/2022 indicated aortic atherosclerosis, small bowel obstruction, large ventral hernia and nodular contour of the liver suggestive of cirrhosis.  He complained of chronic right neck pain which reported ongoing for 2 to 3 months.  If pain improved with supportive care that patient requested narcotic for a few days along with muscle relaxant. Outpatient follow-up with orthopedic was recommended to be arranged by PCP.  Patient here today requesting another round of hydrocodone until he is able to be seen by orthopedic.  States current tramadol ineffective.  Also had depression with questionable suicidal ideation on 02/11/22 during the night psychiatry follow-up as advised. Suicidal ideation resolved and small bowel obstruction function improved on 02/12/2022. He was cleared by psychiatry on discharge since symptoms resolved. States was not suicidal was just dealing with a lot of pain in the hospital.    He was discharged home on home health physical therapy,Occupational Therapy registered nurse and home health aide.States he declined occupational therapy and home health aide just following up with physical therapy.He was discharged back to [facility] independent living with assistance from ex-wife.He was advised to follow-up with [place] surgery in 1 week. Also to follow-up with a nephrologist.  He continues to complain of right neck and shoulder pain awaiting orthopedic appointment.   Past Medical History:  Diagnosis  Date   Alcohol use disorder in remission    Anxiety and depression    At high risk for falls    Former cigarette smoker    Generalized anxiety  disorder    Hard of hearing    Hyperlipidemia due to type 2 diabetes mellitus ([place])    Hypertension    Morbid obesity ([place])    Neuropathy    O2 dependent    Risk for falls    Sleep apnea    Type 2 diabetes mellitus ([place])    Past Surgical History:  Procedure Laterality Date   APPENDECTOMY  1980   IR THORACENTESIS ASP PLEURAL SPACE W/IMG GUIDE  05/18/2021   IR THORACENTESIS ASP PLEURAL SPACE W/IMG GUIDE  10/06/2021   KNEE SURGERY Right    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   Procedure: PACEMAKER IMPLANT;  Surgeon: Deboraha Sprang, MD;  Location: [facility];  Service: Cardiovascular;  Laterality: N/A;    Allergies  Allergen Reactions   Tape Rash    If left on for a long time    Allergies as of 02/20/2022       Reactions   Tape Rash   If left on for a long time        Medication List        Accurate as of February 20, 2022  2:09 PM. If you have any questions, ask your nurse or doctor.          STOP taking these medications    DRY EYE RELIEF OP Stopped by: [name], NP       TAKE these medications    acetaminophen 325 MG tablet Commonly known as: TYLENOL Take 2 tablets (650 mg total) by mouth every 6 (six) hours as needed for mild pain or moderate pain.   alfuzosin 10 MG 24 hr tablet Commonly known as: UROXATRAL TAKE 1 TABLET BY MOUTH EVERY DAY   ALPRAZolam 0.25 MG tablet Commonly known as: XANAX Take 2 tablets (0.5 mg total) by mouth 2 (two) times daily as needed for anxiety or sleep.   calcium carbonate 500 MG chewable tablet Commonly known as: TUMS - dosed in mg elemental calcium Chew 1 tablet by mouth 2 (two) times daily.   clopidogrel 75 MG tablet Commonly known as: PLAVIX Take 1 tablet (75 mg total) by mouth daily.   cyanocobalamin 1000 MCG tablet Take 1,000 mcg by mouth daily.   cyclobenzaprine 10 MG tablet Commonly known as: FLEXERIL Take 1 tablet (10 mg total) by mouth 3 (three) times daily as needed for muscle spasms.    diclofenac Sodium 1 % Gel Commonly known as: VOLTAREN Apply 2 g topically 4 (four) times daily. Apply to your right neck and shoulder area 3-4 times a day as needed   DULoxetine 60 MG capsule Commonly known as: CYMBALTA Take 1 capsule (60 mg total) by mouth daily.   FeroSul 325 (65 FE) MG tablet Generic drug: ferrous sulfate TAKE 1 TABLET(325 MG) BY MOUTH DAILY WITH BREAKFAST   furosemide 40 MG tablet Commonly known as: LASIX Take 2 tablets (80 mg total) by mouth in the morning and at bedtime.   glucose blood test strip Use to test blood sugar once daily. Dx:E11.22   HYDROcodone-acetaminophen 7.5-325 MG tablet Commonly known as: NORCO Take 1 tablet by mouth every 12 (twelve) hours as needed for severe pain.   Insulin Pen Needle 32G X 4 MM Misc Use to inject insulin twice daily. Dx: E11.22   isosorbide  mononitrate 60 MG 24 hr tablet Commonly known as: IMDUR Take 1 tablet (60 mg total) by mouth daily.   Jardiance 25 MG Tabs tablet Generic drug: empagliflozin Take 25 mg by mouth daily. What changed: Another medication with the same name was removed. Continue taking this medication, and follow the directions you see here. Changed by: [name], NP   ketoconazole 2 % cream Commonly known as: NIZORAL Apply 1 application topically as needed for irritation.   Lancet Device Misc 1 Device by Does not apply route daily.   Lancets Misc 1 Device by Does not apply route as directed. Use as directed.   Levemir FlexPen 100 UNIT/ML FlexPen Generic drug: insulin detemir Inject 42 Units into the skin at bedtime.   loperamide 2 MG capsule Commonly known as: Imodium A-D Take 1 capsule (2 mg total) by mouth 4 (four) times daily as needed for diarrhea or loose stools.   nystatin powder Generic drug: nystatin Apply 1 application  topically daily as needed (Yeast infection to afffected area).   ondansetron 4 MG tablet Commonly known as: Zofran Take 1 tablet (4 mg total) by  mouth every 8 (eight) hours as needed for nausea or vomiting.   OVER THE COUNTER MEDICATION Apply 1 Application topically every other day. Head and shoulder medicated shampoo alternate with selsun blue medicated   OXYGEN Inhale 2 L into the lungs continuous.   pantoprazole 40 MG tablet Commonly known as: PROTONIX Take 1 tablet (40 mg total) by mouth daily.   potassium chloride SA 20 MEQ tablet Commonly known as: KLOR-CON M Take 1 tablet (20 mEq total) by mouth 2 (two) times daily.   Red Yeast Rice 600 MG Caps Take 1,200 mg by mouth daily.   rosuvastatin 10 MG tablet Commonly known as: CRESTOR TAKE 1 TABLET BY MOUTH EVERY DAY   simethicone 80 MG chewable tablet Commonly known as: MYLICON Chew 80 mg by mouth every 6 (six) hours as needed for flatulence.   topiramate 25 MG tablet Commonly known as: TOPAMAX TAKE 1 TABLET BY MOUTH EVERY DAY   traMADol 50 MG tablet Commonly known as: ULTRAM TAKE 1 TABLET(50 MG) BY MOUTH TWICE DAILY AS NEEDED   traZODone 50 MG tablet Commonly known as: DESYREL TAKE 2 TABLETS(100 MG) BY MOUTH AT BEDTIME   triamcinolone cream 0.1 % Commonly known as: KENALOG Apply 1 application. topically 2 (two) times daily. Apply to affected areas on shin area   TURMERIC PO Take 1,000 mg by mouth daily.   Victoza 18 MG/3ML Sopn Generic drug: liraglutide ADMINISTER 1.8 MG UNDER THE SKIN EVERY DAY   Vitamin D3 50 MCG (2000 UT) capsule Take 2,000 Units by mouth daily. What changed: Another medication with the same name was removed. Continue taking this medication, and follow the directions you see here. Changed by: [name], NP        Review of Systems  Constitutional:  Negative for appetite change, chills, fatigue, fever and unexpected weight change.  HENT:  Negative for congestion, ear discharge, ear pain, hearing loss, nosebleeds, postnasal drip, rhinorrhea, sinus pressure, sinus pain, sneezing, sore throat, tinnitus and trouble  swallowing.   Eyes:  Negative for pain, discharge, redness, itching and visual disturbance.  Respiratory:  Negative for cough, chest tightness, shortness of breath and wheezing.   Cardiovascular:  Negative for chest pain, palpitations and leg swelling.  Gastrointestinal:  Negative for abdominal distention, abdominal pain, blood in stool, constipation, diarrhea, nausea and vomiting.  Colostomy patent   Endocrine: Negative for cold intolerance, heat intolerance, polydipsia, polyphagia and polyuria.  Genitourinary:  Negative for difficulty urinating, dysuria, flank pain, frequency and urgency.  Musculoskeletal:  Positive for arthralgias, gait problem and neck pain. Negative for back pain, joint swelling, myalgias and neck stiffness.       Right neck and shoulder pain   Skin:  Negative for color change, pallor, rash and wound.  Neurological:  Negative for dizziness, syncope, speech difficulty, weakness, light-headedness, numbness and headaches.  Hematological:  Does not bruise/bleed easily.  Psychiatric/Behavioral:  Negative for agitation, behavioral problems, confusion, hallucinations, self-injury, sleep disturbance and suicidal ideas. The patient is not nervous/anxious.     Immunization History  Administered Date(s) Administered   Fluad Quad(high Dose 65+) 11/19/2020, 11/02/2021   H1N1 01/24/2008   Moderna Sars-Covid-2 Vaccination 04/03/2019, 05/05/2019, 12/20/2019, 05/23/2020   Zoster Recombinat (Shingrix) 03/01/2020   Pertinent  Health Maintenance Due  Topic Date Due   FOOT EXAM  Never done   OPHTHALMOLOGY EXAM  Never done   HEMOGLOBIN A1C  06/20/2022   INFLUENZA VACCINE  Completed   COLONOSCOPY (Pts 45-63yrs Insurance coverage will need to be confirmed)  Discontinued      02/11/2022    7:45 PM 02/12/2022    8:00 AM 02/12/2022    8:36 PM 02/13/2022    8:00 AM 02/20/2022    1:51 PM  [place] in the past year?     0  Was there an injury with Fall?     0  Fall Risk  Category Calculator     0  Fall Risk Category     Low  Patient Fall Risk Level Moderate fall risk Moderate fall risk Moderate fall risk Moderate fall risk Low fall risk  Patient at Risk for Falls Due to     No Fall Risks  Fall risk Follow up     Falls evaluation completed   Functional Status Survey:    Vitals:   02/20/22 1342  BP: 118/70  Pulse: 63  Resp: 18  Temp: (!) 97.5 F (36.4 C)  SpO2: 99%  Height: 6' (1.829 m)   Body mass index is 48.82 kg/m. Physical Exam Vitals reviewed.  Constitutional:      General: He is not in acute distress.    Appearance: Normal appearance. He is morbidly obese. He is not ill-appearing or diaphoretic.  HENT:     Head: Normocephalic.     Right Ear: Tympanic membrane, ear canal and external ear normal. There is no impacted cerumen.     Left Ear: Tympanic membrane, ear canal and external ear normal. There is no impacted cerumen.     Nose: Nose normal. No congestion or rhinorrhea.     Mouth/Throat:     Mouth: Mucous membranes are moist.     Pharynx: Oropharynx is clear. No oropharyngeal exudate or posterior oropharyngeal erythema.  Eyes:     General: No scleral icterus.       Right eye: No discharge.        Left eye: No discharge.     Extraocular Movements: Extraocular movements intact.     Conjunctiva/sclera: Conjunctivae normal.     Pupils: Pupils are equal, round, and reactive to light.  Neck:     Vascular: No carotid bruit.  Cardiovascular:     Rate and Rhythm: Normal rate and regular rhythm.     Pulses: Normal pulses.     Heart sounds: Normal heart sounds. No murmur heard.  No friction rub. No gallop.  Pulmonary:     Effort: Pulmonary effort is normal. No respiratory distress.     Breath sounds: Normal breath sounds. No wheezing, rhonchi or rales.  Chest:     Chest wall: No tenderness.  Abdominal:     General: Bowel sounds are normal. There is no distension.     Palpations: Abdomen is soft. There is no mass.     Tenderness:  There is no abdominal tenderness. There is no right CVA tenderness, left CVA tenderness, guarding or rebound.     Comments: Right colostomy   Musculoskeletal:        General: No swelling or tenderness. Normal range of motion.     Cervical back: Normal range of motion. No rigidity or tenderness.     Right lower leg: No edema.     Left lower leg: No edema.  Lymphadenopathy:     Cervical: No cervical adenopathy.  Skin:    General: Skin is warm and dry.     Coloration: Skin is not pale.     Findings: No bruising, erythema, lesion or rash.  Neurological:     Mental Status: He is alert and oriented to person, place, and time.     Cranial Nerves: No cranial nerve deficit.     Sensory: No sensory deficit.     Motor: No weakness.     Coordination: Coordination normal.     Gait: Gait abnormal.  Psychiatric:        Mood and Affect: Mood normal.        Speech: Speech normal.        Behavior: Behavior normal.        Thought Content: Thought content normal.        Judgment: Judgment normal.     Labs reviewed: Recent Labs    02/10/22 1623 02/11/22 0209 02/12/22 0212 02/13/22 0302  NA 138 138 138 136  K 4.3 3.9 3.7 3.6  CL 100 101 104 100  CO2 19* 19* 19* 25  GLUCOSE 219* 175* 146* 165*  BUN 75* 86* 91* 60*  CREATININE 3.92* 4.48* 3.83* 2.42*  CALCIUM 8.9 9.0 8.4* 8.8*  MG  --  2.9* 2.9* 2.8*  PHOS 4.9*  --   --   --    Recent Labs    10/31/21 1920 12/20/21 0000 12/29/21 0000 02/09/22 0039 02/09/22 0452 02/10/22 1623  AST 13* 14  --  20 20  --   ALT 11 11  --  15 14  --   ALKPHOS 70 81  --  59 65  --   BILITOT 0.4  --   --  0.7 0.8  --   PROT 7.2  --   --  7.2 7.5  --   ALBUMIN 3.8 3.8   < > 3.5 3.5 3.4*   < > = values in this interval not displayed.   Recent Labs    02/11/22 0209 02/12/22 0212 02/13/22 0302  WBC 8.6 7.0 8.1  NEUTROABS 6.1 5.0 6.2  HGB 12.2* 10.4* 11.1*  HCT 37.4* 32.9* 34.0*  MCV 88.6 90.6 89.7  PLT 308 274 257   Lab Results  Component  Value Date   TSH 1.58 12/20/2021   Lab Results  Component Value Date   HGBA1C 8.0 12/20/2021   Lab Results  Component Value Date   CHOL 131 12/20/2021   HDL 47 12/20/2021   LDLCALC 65 12/20/2021   TRIG 104 12/20/2021   CHOLHDL 3.2 11/02/2021  Significant Diagnostic Results in last 30 days:  DG Shoulder Right Port  Result Date: 02/13/2022 CLINICAL DATA:  Right shoulder pain. EXAM: RIGHT SHOULDER - 1 VIEW COMPARISON:  None Available. FINDINGS: Technically suboptimal due to nonstandard positioning. There is no evidence of fracture or dislocation. Probable glenohumeral degenerative spurring noted although the joint space is not well profiled. No other osseous abnormality identified. IMPRESSION: No acute findings. Glenohumeral osteoarthritis. Electronically Signed   By: Marlaine Hind M.D.   On: 02/13/2022 10:33   DG Abd Portable 1V  Result Date: 02/12/2022 CLINICAL DATA:  Small-bowel obstruction, enteric catheter placement EXAM: PORTABLE ABDOMEN - 1 VIEW COMPARISON:  02/12/2022 at 7:46 a.m. FINDINGS: There are 5 supine frontal views of the lower chest and abdomen obtained. Evaluation is limited by portable technique and body habitus. Enteric catheter passes below diaphragm, tip and side port projecting over the gastric fundus. No change in gaseous distention of the bowel within the left hemiabdomen consistent with obstruction. There is persistent gaseous distension of a portion of the stomach protruding through a large ventral hernia as seen on prior CT. Stable left basilar consolidation and effusion. IMPRESSION: 1. Enteric catheter tip and side port projecting over the gastric fundus. 2. Stable gaseous distension of the portion of the stomach protruding through a large ventral hernia as seen on prior CT. 3. Stable gaseous distention of the small bowel consistent with obstruction. Electronically Signed   By: Randa Ngo M.D.   On: 02/12/2022 18:09   DG Abd Portable 1V  Result Date:  02/12/2022 CLINICAL DATA:  Small-bowel obstruction, verify NG tube placement EXAM: PORTABLE ABDOMEN - 1 VIEW COMPARISON:  Radiograph 02/11/2022 FINDINGS: Nasogastric tube tip and side port overlie the stomach. There is persistent bowel and stomach dilation, not significantly changed from prior. Note that a portion of the herniated stomach remains distended. Persistent small left pleural effusion adjacent atelectasis. IMPRESSION: Nasogastric tube tip and side port overlie the proximal stomach, with persistent distension of the more distal herniated stomach and persistent small bowel dilation compatible with obstruction. Persistent small left pleural effusion and adjacent atelectasis. Electronically Signed   By: Maurine Simmering M.D.   On: 02/12/2022 08:31   DG Abd Portable 1V  Result Date: 02/11/2022 CLINICAL DATA:  Small bowel obstruction EXAM: PORTABLE ABDOMEN - 1 VIEW COMPARISON:  Radiograph from yesterday and abdominal CT from 2 days prior FINDINGS: Gas distended stomach despite an enteric tube, appearance unchanged from initial imaging this admission. No gas dilated small bowel loops are seen but these loops are mainly fluid-filled on prior CT. Left pleural effusion that is loculated by CT. IMPRESSION: 1. Continued gaseous distension of the stomach despite enteric tube, is the tube functioning? 2. Small bowel obstruction with mainly fluid-filled loops by CT, no interval change. Electronically Signed   By: Jorje Guild M.D.   On: 02/11/2022 06:45   DG Abd Portable 1V-Small Bowel Obstruction Protocol-initial, 8 hr delay  Result Date: 02/10/2022 CLINICAL DATA:  Small bowel obstruction EXAM: PORTABLE ABDOMEN - 1 VIEW COMPARISON:  None Available. FINDINGS: NG tube tip has been retracted with tip in the proximal stomach and side port near the GE junction. Gaseous distention of the stomach and multiple dilated loops of small bowel, similar to prior exam. No radio-opaque calculi or other significant  radiographic abnormality are seen. IMPRESSION: 1. NG tube tip has been retracted with tip in the proximal stomach and side port near the GE junction, recommend advancement. 2. Persistent small bowel obstruction. Electronically Signed  By: Yetta Glassman M.D.   On: 02/10/2022 20:02   DG Abd Portable 1V  Result Date: 02/10/2022 CLINICAL DATA:  Small-bowel obstruction EXAM: PORTABLE ABDOMEN - 1 VIEW COMPARISON:  Radiograph 02/09/2022 FINDINGS: Nasogastric tube tip and side port overlie the stomach. There is persistent gaseous distension and small bowel dilation. IMPRESSION: Nasogastric tube tip and side port overlie the stomach, which remains distended, correlate with output. Persistent bowel obstruction. Electronically Signed   By: Maurine Simmering M.D.   On: 02/10/2022 08:51   DG Abd Portable 1V  Result Date: 02/09/2022 CLINICAL DATA:  Check gastric catheter placement EXAM: PORTABLE ABDOMEN - 1 VIEW COMPARISON:  None Available. FINDINGS: Gastric catheter is noted with the tip in the stomach although the proximal side port lies in the distal esophagus. This should be advanced deeper into the stomach. No free air is seen. IMPRESSION: Gastric catheter as described which should be advanced several cm deeper into the stomach. Electronically Signed   By: Inez Catalina M.D.   On: 02/09/2022 21:21   DG Abd Portable 1V  Result Date: 02/09/2022 CLINICAL DATA:  Small bowel obstruction EXAM: PORTABLE ABDOMEN - 1 VIEW COMPARISON:  02/09/2022, 9:32 a.m. FINDINGS: Examination is extremely limited by body habitus and severe underpenetration. A previously seen esophagogastric tube has been removed. There remains distended stomach and small bowel throughout the ventral abdomen, not obviously changed compared to prior examination, however assessment is extremely limited. No obvious free air. IMPRESSION: Examination is extremely limited by body habitus and severe underpenetration. A previously seen esophagogastric tube has  been removed. There remains distended stomach and small bowel throughout the ventral abdomen, not obviously changed compared to prior examination and concerning for bowel obstruction, however assessment is extremely limited. Electronically Signed   By: Delanna Ahmadi M.D.   On: 02/09/2022 17:35   DG Chest Port 1 View  Result Date: 02/09/2022 CLINICAL DATA:  Shortness of breath EXAM: PORTABLE CHEST 1 VIEW COMPARISON:  Previous studies including the chest radiograph done on 10/06/2021 FINDINGS: Transverse diameter of heart is increased. There are no signs of alveolar pulmonary edema. There is increased density in left lower lung field obscuring the left hemidiaphragm. Pacemaker battery is seen in the left infraclavicular region. Enteric tube is seen in esophagus. Distal end of enteric tube is not adequately visualized to evaluate its location. IMPRESSION: Cardiomegaly. Increased density in left lower lung fields may suggest left pleural effusion and underlying atelectasis/pneumonia. Tip of enteric tube is not adequately visualized and location of the tip could not be evaluated. Electronically Signed   By: Elmer Picker M.D.   On: 02/09/2022 17:33   DG Abd Portable 1V-Small Bowel Protocol-Position Verification  Result Date: 02/09/2022 CLINICAL DATA:  NG tube placed EXAM: PORTABLE ABDOMEN - 1 VIEW COMPARISON:  CT 02/09/2022 FINDINGS: Nasogastric tube tip overlies the stomach, side port near the GE junction. There is marked stomach dilation. Left pleural effusion and adjacent rounded atelectasis, as seen on recent CT. IMPRESSION: Nasogastric tube tip overlies the stomach, side port near the GE junction. Recommend advancement by 7.0 cm. Electronically Signed   By: Maurine Simmering M.D.   On: 02/09/2022 12:50   CT ABDOMEN PELVIS W CONTRAST  Result Date: 02/09/2022 CLINICAL DATA:  Bowel obstruction suspected, history of multiple surgeries. No osteotomy output in 6 hours. Abdominal distention. EXAM: CT ABDOMEN  AND PELVIS WITH CONTRAST TECHNIQUE: Multidetector CT imaging of the abdomen and pelvis was performed using the standard protocol following bolus administration of intravenous contrast. RADIATION DOSE  REDUCTION: This exam was performed according to the departmental dose-optimization program which includes automated exposure control, adjustment of the mA and/or kV according to patient size and/or use of iterative reconstruction technique. CONTRAST:  58mL OMNIPAQUE IOHEXOL 350 MG/ML SOLN COMPARISON:  03/06/2015. FINDINGS: Lower chest: The heart is enlarged and there is a small pericardial effusion. Pacemaker leads are noted in the heart. With rounded opacities at the left lung base. Right lung base is clear. Hepatobiliary: The liver has a nodular contour suggesting underlying cirrhosis. The gallbladder is without stones. No biliary ductal dilatation. Pancreas: Fatty atrophy. No pancreatic ductal dilatation or surrounding inflammatory changes. Spleen: Normal in size without focal abnormality. Adrenals/Urinary Tract: No adrenal nodule or mass. There is bilateral renal atrophy with renal cortical thinning hearing. A isoechoic exophytic lesion is present along the posterior aspect of the left kidney measuring 2.5 cm. No renal calculus or hydronephrosis. The bladder is unremarkable. Stomach/Bowel: The distal esophagus is mildly distended with fluid. There is a large complex ventral abdominal wall hernia containing small bowel, stomach, and a portion of the colon in the midline. The hernia pouch is are not well evaluated and incompletely imaged due to field of view and patient's body habitus. There is dilatation of the proximal small bowel with air-fluid levels measuring up to 4.7 cm to the level of a spigelian hernia on the left. The visualized loops of small bowel in the spigelian hernia pouch are distended measuring 4 cm, however only partially included in the field of view. The remaining loops of small bowel in the  abdomen and hernia pouch are decompressed. No free air or pneumatosis. An ostomy is noted in the right lower quadrant. Vascular/Lymphatic: Aortic atherosclerosis. No enlarged abdominal or pelvic lymph nodes. Reproductive: Prostate is unremarkable. Other: No abdominopelvic ascites. Musculoskeletal: Degenerative changes in the thoracolumbar spine. No acute osseous abnormality. IMPRESSION: 1. Distention of the stomach and proximal small bowel measuring up to 4.7 cm to the level of the left spigelian hernia. The small bowel in the spigelian hernia pouch measures up to 4 cm. The remaining distal small-bowel is decompressed. Findings are concerning for small bowel obstruction, however the hernia pouch is only partially visualized on exam due to patient's body habitus. Surgical consultation is recommended. 2. Large ventral abdominal wall hernia containing a portion of the stomach, small bowel, and colon, only partially visualized on exam. 3. Nodular contour of the liver, suggesting underlying cirrhosis. 4. Exophytic isodense lesion in the posterior aspect of the left kidney which is new from the previous exam. Ultrasound is recommended for further characterization. 5. Small left pleural effusion with rounded opacities at the left lung base, possible atelectasis or pneumonia. The possibility of underlying malignancy can not be completely excluded. 6. Aortic atherosclerosis. 7. Remaining findings as described above. Electronically Signed   By: Brett Fairy M.D.   On: 02/09/2022 03:26    Assessment/Plan 1. Benign hypertension with CKD (chronic kidney disease) stage III (HCC) Blood pressure well-controlled.  Losartan discontinued during hospitalization -Continue on furosemide and Imdur  - advised to continue to monitor B/p at home and notify provider if > 140/90 -Follow-up with nephrologist for ongoing chronic kidney disease - CBC with Differential/Platelet - CMP with eGFR([facility])  2. Type 2 diabetes mellitus with  stage 3 chronic kidney disease, without long-term current use of insulin, unspecified whether stage 3a or 3b CKD ([place]) Lab Results  Component Value Date   HGBA1C 8.0 12/20/2021  -Continue on Levemir and Jardiance  3. Neck pain Pain worsened  during hospitalization requested referral for follow-up with orthopedic.  Also requesting hydrocodone refill for few days until he is able to see the orthopedic.  Stated will not use tramadol while on hydrocodone.  4. Right shoulder pain, unspecified chronicity Got worse during hospitalization request follow-up with orthopedic today.  Hydrocodone as above  5. Restless leg syndrome Requested medication for restless leg Will start on Requip  6. Cirrhosis of liver without ascites, unspecified hepatic cirrhosis type ([place]) Noted on recent abdominal CT scan outpatient follow-up ultrasound recommended. - US Abdomen Limited RUQ (LIVER/GB); Future  7. SBO (small bowel obstruction) (HCC) Status post hospitalization with obstruction.symptoms resolved.follow up with general surgery as directed.continue to advised diet as tolerated to heart healthy diet as recommended in the hospital.   8. Presence of colostomy [facility]) Colostomy patent   Family/ staff Communication: Reviewed plan of care with patient verbalized understanding  Labs/tests ordered:  - US Abdomen Limited RUQ (LIVER/GB); Future - CBC with Differential/Platelet - CMP with eGFR([facility])  Next Appointment : Return if symptoms worsen or fail to improve.   [name], NP

## 2022-02-21 LAB — CBC WITH DIFFERENTIAL/PLATELET
Absolute Monocytes: 837 cells/uL (ref 200–950)
Basophils Absolute: 28 cells/uL (ref 0–200)
Basophils Relative: 0.3 %
Eosinophils Absolute: 197 cells/uL (ref 15–500)
Eosinophils Relative: 2.1 %
HCT: 32.2 % — ABNORMAL LOW (ref 38.5–50.0)
Hemoglobin: 10.4 g/dL — ABNORMAL LOW (ref 13.2–17.1)
Lymphs Abs: 940 cells/uL (ref 850–3900)
MCH: 28.3 pg (ref 27.0–33.0)
MCHC: 32.3 g/dL (ref 32.0–36.0)
MCV: 87.7 fL (ref 80.0–100.0)
MPV: 8.9 fL (ref 7.5–12.5)
Monocytes Relative: 8.9 %
Neutro Abs: 7398 cells/uL (ref 1500–7800)
Neutrophils Relative %: 78.7 %
Platelets: 339 10*3/uL (ref 140–400)
RBC: 3.67 10*6/uL — ABNORMAL LOW (ref 4.20–5.80)
RDW: 15 % (ref 11.0–15.0)
Total Lymphocyte: 10 %
WBC: 9.4 10*3/uL (ref 3.8–10.8)

## 2022-02-21 LAB — COMPLETE METABOLIC PANEL WITH GFR
AG Ratio: 1.2 (calc) (ref 1.0–2.5)
ALT: 11 U/L (ref 9–46)
AST: 12 U/L (ref 10–35)
Albumin: 3.5 g/dL — ABNORMAL LOW (ref 3.6–5.1)
Alkaline phosphatase (APISO): 64 U/L (ref 35–144)
BUN/Creatinine Ratio: 10 (calc) (ref 6–22)
BUN: 21 mg/dL (ref 7–25)
CO2: 18 mmol/L — ABNORMAL LOW (ref 20–32)
Calcium: 7.8 mg/dL — ABNORMAL LOW (ref 8.6–10.3)
Chloride: 103 mmol/L (ref 98–110)
Creat: 2.21 mg/dL — ABNORMAL HIGH (ref 0.70–1.28)
Globulin: 3 g/dL (calc) (ref 1.9–3.7)
Glucose, Bld: 261 mg/dL — ABNORMAL HIGH (ref 65–99)
Potassium: 4.5 mmol/L (ref 3.5–5.3)
Sodium: 133 mmol/L — ABNORMAL LOW (ref 135–146)
Total Bilirubin: 0.3 mg/dL (ref 0.2–1.2)
Total Protein: 6.5 g/dL (ref 6.1–8.1)
eGFR: 31 mL/min/{1.73_m2} — ABNORMAL LOW (ref >=60)

## 2022-02-24 DIAGNOSIS — G4733 Obstructive sleep apnea (adult) (pediatric): Secondary | ICD-10-CM | POA: Diagnosis not present

## 2022-02-25 ENCOUNTER — Other Ambulatory Visit: Payer: Self-pay | Admitting: Family

## 2022-02-26 ENCOUNTER — Other Ambulatory Visit: Payer: Self-pay | Admitting: Family

## 2022-02-26 NOTE — Telephone Encounter (Signed)
Rosuvastatin and Cymbalta refilled as requested.

## 2022-02-27 ENCOUNTER — Other Ambulatory Visit: Payer: Self-pay | Admitting: Family

## 2022-02-27 DIAGNOSIS — G8929 Other chronic pain: Secondary | ICD-10-CM

## 2022-02-27 DIAGNOSIS — E1142 Type 2 diabetes mellitus with diabetic polyneuropathy: Secondary | ICD-10-CM

## 2022-02-27 DIAGNOSIS — K219 Gastro-esophageal reflux disease without esophagitis: Secondary | ICD-10-CM

## 2022-02-27 MED ORDER — PANTOPRAZOLE SODIUM 40 MG PO TBEC: 40.0000 mg | DELAYED_RELEASE_TABLET | Freq: Two times a day (BID) | ORAL | 3 refills | Status: DC

## 2022-02-27 NOTE — Progress Notes (Signed)
Protonix prescription changed to twice daily per pt's request.script send to pharmacy.

## 2022-02-27 NOTE — Telephone Encounter (Signed)
Patient is requesting a refill of the following medications: Requested Prescriptions   Pending Prescriptions Disp Refills   traMADol (ULTRAM) 50 MG tablet [Pharmacy Med Name: TRAMADOL 50MG  TABLETS] 60 tablet     Sig: TAKE 1 TABLET(50 MG) BY MOUTH TWICE DAILY AS NEEDED    Date of last refill: 10/03/21   Refill amount: #60, 3 refills   Treatment agreement date: 12/22/21

## 2022-02-28 ENCOUNTER — Other Ambulatory Visit: Payer: Self-pay | Admitting: Family

## 2022-02-28 ENCOUNTER — Telehealth: Payer: Self-pay

## 2022-02-28 DIAGNOSIS — M542 Cervicalgia: Secondary | ICD-10-CM | POA: Diagnosis not present

## 2022-02-28 DIAGNOSIS — S29012D Strain of muscle and tendon of back wall of thorax, subsequent encounter: Secondary | ICD-10-CM | POA: Diagnosis not present

## 2022-02-28 DIAGNOSIS — G8929 Other chronic pain: Secondary | ICD-10-CM

## 2022-02-28 DIAGNOSIS — M62838 Other muscle spasm: Secondary | ICD-10-CM | POA: Diagnosis not present

## 2022-02-28 DIAGNOSIS — E1142 Type 2 diabetes mellitus with diabetic polyneuropathy: Secondary | ICD-10-CM

## 2022-02-28 MED ORDER — TRAMADOL HCL 50 MG PO TABS: ORAL_TABLET | ORAL | 3 refills | Status: DC

## 2022-02-28 NOTE — Telephone Encounter (Signed)
Will order liver and renal ultrasound.imaging place will call you for appointment.

## 2022-02-28 NOTE — Telephone Encounter (Signed)
Beth, Nurse with [name] called concerning order for renal and live ultrasound. She states that patient was ti have follow up renal and liver ultrasound a few weeks after discharge. Below is the information from the discharge summary  Incidental finding of probably cirrhosis of liver and nonspecific renal findings.  Will need dedicated outpatient renal and liver ultrasound to be done by PCP few weeks after discharge.     Message routed to Marlowe Sax, NP High priority

## 2022-02-28 NOTE — Progress Notes (Signed)
Tramadol prescription sent to pharmacy

## 2022-03-02 DIAGNOSIS — N410 Acute prostatitis: Secondary | ICD-10-CM | POA: Insufficient documentation

## 2022-03-05 NOTE — Telephone Encounter (Signed)
Message forwarded to Ngetich, Nelda Bucks, NP to respond to patient

## 2022-03-06 ENCOUNTER — Other Ambulatory Visit: Payer: Self-pay

## 2022-03-06 ENCOUNTER — Telehealth: Payer: Medicare PPO

## 2022-03-06 NOTE — Telephone Encounter (Signed)
-----  Message from [name], NP sent at 03/06/2022  6:12 AM EST ----- - Hemoglobin level slightly low but stable consistent with chronic kidney disease which is also high but stable.Continue to follow-up with nephrologist. -Glucose is high recommend increasing Levemir from 42 units to 45 units at bedtime.  -Sodium and calcium level also slightly low.suspect related to chronic kidney function.Recommend checking Parathyroid level ( serum PTH),vit D,Magnesium level and Phosphate if not done by Nephrologist.

## 2022-03-07 ENCOUNTER — Telehealth: Payer: Self-pay | Admitting: Cardiology

## 2022-03-07 ENCOUNTER — Telehealth: Payer: Self-pay | Admitting: Internal Medicine

## 2022-03-07 NOTE — Telephone Encounter (Signed)
Pt c/o swelling: STAT is pt has developed SOB within 24 hours  If swelling, where is the swelling located? LE  How much weight have you gained and in what time span? 8 lbs in a week  Have you gained 3 pounds in a day or 5 pounds in a week? Yes   Do you have a log of your daily weights (if so, list)? Yes   Are you currently taking a fluid pill? Yes   Are you currently SOB? No   Have you traveled recently? No    Patient is also having trouble with prostate. Yesterday took have of lasix.

## 2022-03-07 NOTE — Telephone Encounter (Signed)
Spoke to [employer] with Jesse [name] calling to report patient has gained 8 lbs with 1 week.Stated he decreased Lasix to 40 mg twice a day due to having prostate problems,painful urination.He is waiting on a call back from PCP.Stated he is eating more.She instructed him to follow low salt diet.He is drinking more water due to being thirsty.She instructed him he is suppose to have 1 liter of fluid daily. Advised he should take Lasix 40 mg 2 tablets twice a day as prescribed.Appointment scheduled with Dr.Branch 1/23 at 2:00 pm.

## 2022-03-07 NOTE — Telephone Encounter (Signed)
error 

## 2022-03-09 ENCOUNTER — Telehealth: Payer: Self-pay

## 2022-03-09 ENCOUNTER — Other Ambulatory Visit: Payer: Self-pay | Admitting: Nephrology

## 2022-03-09 DIAGNOSIS — N1832 Chronic kidney disease, stage 3b: Secondary | ICD-10-CM

## 2022-03-09 NOTE — Telephone Encounter (Signed)
Order clarification : draw Phosphorus level not phosphate.

## 2022-03-09 NOTE — Telephone Encounter (Signed)
Beth received order to check labs(refer to labs drawn/resulted here on 02/20/22), however after speaking with [facility] they need clarification as to whether Ngetich, [name] C, NP would like a phosphorus test or alkaline phospate test as  "phosphate" is not an option.   Please advise   Per Beth call [phone] with response

## 2022-03-10 ENCOUNTER — Other Ambulatory Visit: Payer: Self-pay | Admitting: Family

## 2022-03-10 DIAGNOSIS — F411 Generalized anxiety disorder: Secondary | ICD-10-CM

## 2022-03-12 NOTE — Telephone Encounter (Signed)
I called beth with Ngetich, [name] C, NP reply and she verbalized understanding

## 2022-03-12 NOTE — Telephone Encounter (Signed)
Alprazolam refilled on 03/10/2022 verify with pharmacy.

## 2022-03-13 ENCOUNTER — Encounter: Payer: Self-pay | Admitting: Internal Medicine

## 2022-03-13 ENCOUNTER — Ambulatory Visit: Payer: Medicare PPO | Attending: Internal Medicine | Admitting: Internal Medicine

## 2022-03-13 VITALS — BP 111/60 | HR 93 | Ht 72.0 in | Wt 348.0 lb

## 2022-03-13 DIAGNOSIS — I5031 Acute diastolic (congestive) heart failure: Secondary | ICD-10-CM

## 2022-03-13 NOTE — Progress Notes (Signed)
Cardiology Office Note:    Date:  03/13/2022   ID:  JAYLUN FLEENER, DOB 1950/05/31, MRN [id number]  PCP:  [name], NP   [facility] Providers Cardiologist:  Janina Mayo, MD Electrophysiologist:  Virl Axe, MD     Referring MD: [name], NP   No chief complaint on file. HFpEF  History of Present Illness:    Jesse [name] is a 72 y.o. male with a hx of CKD3b, obesity,  bed/wheelchair bound,  hypertension, type 2 diabetes mellitus 5.9 %, sp colectomy/ colostomy, iron deficiency anemia on oral iron, OSA on CPAP, HFpEF  Per her EMR report. He presented with acute on chronic respiratory failure and noted LE edema. His BNP was 218. Hgb 9.1. He was diuresed. He had a large pleural effusion s/p thora yielding 1.2 L.  Fluid is hazy, cloudy and yellow. Cytology was negative. Had a repeat 2L drained. Non infectious.He was diuresed with IV Lasix he is  17 L negative, weight down 38 pounds. His dry weight is ~ 363 pounds. He takes lasix 40 mg BID.  Wt Readings from Last 3 Encounters:  02/09/22 (!) 360 lb (163.3 kg)  12/28/21 (!) 354 lb (160.6 kg)  12/22/21 (!) 350 lb (158.8 kg)   TC- 94 HDL 39 LDL 38 TSH normal  Crt 2.1 on 05/23/2021 from 1.8-2 , K 4.6  He notes PT looked at his legs prior to the hospital and she instructed him to seek care. This was the first time he was admitted for for decompensated HF. He's been in the hospital. 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  He has a large hernia.He has hx of  CKD3a/b. He was being seen in [place] for fluid retention related to kidney disease. He's been on metolazone and bumex in the past. He self adjusted his diuretics. He does not have scale. He sleeps in a recliner and hoping to trial his bed that's at an incline.  His L>R of his legs He had no DVT. Noted did not want DNR.  He has a living will. His son and wife are designated medical power of attorney.  His BP meds imdur and valsartan were  stopped with soft Bps.   Interim 12/28/2021 He was admitted in September with R facial droop with c/f possible TIA? Did not undergo MRI 2/2 concern for two different lead companies. Echo showed normal fxn. CT head was negative. Neurology evaluated him and recommended DAPT for 3 weeks and then plavix  Noted to have some weight gain; told him to double his lasix, returns for a visit. Blood pressures are well controlled. Saw Dr. Caryl Comes 10/31 s/p PPM. No changes. He is on 160 mg lasix notes he is losing weight. He is 345 pounds; notes was 407 in March.    Wt Readings from Last 3 Encounters:  02/09/22 (!) 360 lb (163.3 kg)  12/28/21 (!) 354 lb (160.6 kg)  12/22/21 (!) 350 lb (158.8 kg)    Interim Hx 03/13/2022 He notes having prostate ?infection and dysuria. He lowered his lasix dose to 40 mg BID for a day or so. He also recently was on prednisone. He gained weight 338->348 per patient. He is asymptomatic. His care giver was concerned. He denies angina, dyspnea on exertion, lower extremity pitting edema, PND or orthopnea.    Cardiology Studies: EKG 05/16/2021: junctional rhythm RBBB morphology MLY:YTKP quality very challenging to fully assess WMA. EF 60-65%, RV moderately enlarged. Svi 40 cc/m2. Dilate LA.  Mean MV gradiient 5 mmHg rate 64 bpm noted has mild-mod MS. Mild AS. IVC dialted, with RA pressure of 15 mmHg. Diastolgy indeterminate. 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  CHB with junctional escape rhythm.   PPM DC Medtronic 09/15/2021  Past Medical History:  Diagnosis Date   Alcohol use disorder in remission    Anxiety and depression    At high risk for falls    Former cigarette smoker    Generalized anxiety disorder    Hard of hearing    Hyperlipidemia due to type 2 diabetes mellitus ([place])    Hypertension    Morbid obesity ([place])    Neuropathy    O2 dependent    Risk for falls    Sleep apnea    Type 2 diabetes mellitus  ([place])     Past Surgical History:  Procedure Laterality Date   APPENDECTOMY  1980   IR THORACENTESIS ASP PLEURAL SPACE W/IMG GUIDE  05/18/2021   IR THORACENTESIS ASP PLEURAL SPACE W/IMG GUIDE  10/06/2021   KNEE SURGERY Right    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   Procedure: PACEMAKER IMPLANT;  Surgeon: Deboraha Sprang, MD;  Location: [facility];  Service: Cardiovascular;  Laterality: N/A;    Current Medications: No outpatient medications have been marked as taking for the 03/13/22 encounter (Appointment) with Janina Mayo, MD.     Allergies:   Tape   Social History   Socioeconomic History   Marital status: Divorced    Spouse name: Not on file   Number of children: 3   Years of education: Not on file   Highest education level: Master's degree (e.g., MA, MS, MEng, MEd, MSW, MBA)  Occupational History   Occupation: Retired  Tobacco Use   Smoking status: Former    Packs/day: 1.00    Types: Cigarettes    Quit date: 1999    Years since quitting: 25.0   Smokeless tobacco: Never  Vaping Use   Vaping Use: Never used  Substance and Sexual Activity   Alcohol use: Not Currently   Drug use: Never   Sexual activity: Not on file  Other Topics Concern   Not on file  Social History Narrative   Tobacco use, amount per day now: 0   Past tobacco use, amount per day: 1 pack   How many years did you use tobacco: 10 last 1999   Alcohol use (drinks per week): 1/5 day last 1999   Diet:   Do you drink/eat things with caffeine: Yes   Marital status:   Divorced                               What year were you married? 1980   Do you live in a house, apartment, assisted living, condo, trailer, etc.?    Is it one or more stories? 1   How many persons live in your home? 1   Do you have pets in your home?( please list) Cat   Highest Level of education completed? Post Grad   Current or past profession: Insurance account manager.   Do you exercise? Little                                 Type and  how often?   Do you have a living will?  Yes   Do you have a DNR form?        No                           If not, do you want to discuss one?   Do you have signed POA/HPOA forms?   Yes                     If so, please bring to you appointment      Do you have any difficulty bathing or dressing yourself? Yes   Do you have any difficulty preparing food or eating? No   Do you have any difficulty managing your medications? No   Do you have any difficulty managing your finances? No   Do you have any difficulty affording your medications?  No   Social Determinants of Health   Financial Resource Strain: Low Risk  (05/15/2021)   Overall Financial Resource Strain (CARDIA)    Difficulty of Paying Living Expenses: Not hard at all  Food Insecurity: No Food Insecurity (02/16/2022)   Hunger Vital Sign    Worried About Running Out of Food in the Last Year: Never true    Ran Out of Food in the Last Year: Never true  Transportation Needs: No Transportation Needs (02/16/2022)   PRAPARE - Hydrologist (Medical): No    Lack of Transportation (Non-Medical): No  Physical Activity: Not on file  Stress: Not on file  Social Connections: Not on file     Family History: The patient's family history includes Anxiety disorder in his son; Bipolar disorder in his daughter; Diabetes in his brother.  ROS:   Please see the history of present illness.     All other systems reviewed and are negative.  EKGs/Labs/Other Studies Reviewed:    The following studies were reviewed today:   Recent Labs: 12/20/2021: TSH 1.58 02/13/2022: B Natriuretic Peptide 94.6; Magnesium 2.8 02/20/2022: ALT 11; BUN 21; Creat 2.21; Hemoglobin 10.4; Platelets 339; Potassium 4.5; Sodium 133  Recent Lipid Panel    Component Value Date/Time   CHOL 131 12/20/2021 0000   TRIG 104 12/20/2021 0000   HDL 47 12/20/2021 0000   CHOLHDL 3.2 11/02/2021 0451   VLDL 14 11/02/2021 0451   LDLCALC 65 12/20/2021 0000    LDLCALC 38 03/10/2021 1002     Risk Assessment/Calculations:           Physical Exam:    VS:  Vitals:   03/13/22 1350  BP: 111/60  Pulse: 93  SpO2: 97%    Wt Readings from Last 3 Encounters:  02/09/22 (!) 360 lb (163.3 kg)  12/28/21 (!) 354 lb (160.6 kg)  12/22/21 (!) 350 lb (158.8 kg)     GEN:  Well nourished, well developed in no acute distress, morbidly obese with a large hernia, in a wheelchair HEENT: Normal NECK: habitus prohibitive for JVD assessment LYMPHATICS: No lymphadenopathy CARDIAC: RRR, no murmurs, rubs, gallops RESPIRATORY:  Clear to auscultation without rales, wheezing or rhonchi  ABDOMEN: Soft, non-tender, non-distended MUSCULOSKELETAL:  No edema; No deformity. No pitting edema SKIN: Warm and dry NEUROLOGIC:  Alert and oriented x 3 PSYCHIATRIC:  Normal affect   ASSESSMENT:    HFpeF: He is at his baseline today. He's on 2 L at home likely has obesity hypoventilation component for SOB. Pulmonary HTN Group II and III. His RV is mildly enlarged. Will be important to continue his  CPAP and O2 to keep his PA pressures down. His  A1c goal < 7%, on SGLT2.  --> continue lasix 80 mg BID --> no symptoms or pitting edema; weight gain likely in the setting of prednisone - continue compression stockings - continue Jardiance 25 mg daily  S/p ?CVA: continue plavix. No hx of afib; not seen on recent device transmission  Mild Aortic Root Anuerysm: Yearly surveillance. 3.2024  HLD- continue crestor 10 mg daily. Lipids at goal.  SSS: s/p PPM 09/15/2021, DDD. St. Jude.  Has medtronic and St. Jude leads, both MRI. compatible. follow by Ep. First-degree AV block and intermittent second and third-degree AV block junctional escape   HTN: continue imdur 60 mg daily, losartan 25 mg daily  CKD III: crt bsl 2.2-2.4 PLAN:    In order of problems listed above:   Follow up in 6 months         Medication Adjustments/Labs and Tests Ordered: Current medicines are  reviewed at length with the patient today.  Concerns regarding medicines are outlined above.  No orders of the defined types were placed in this encounter.  No orders of the defined types were placed in this encounter.   There are no Patient Instructions on file for this visit.   Signed, Janina Mayo, MD  03/13/2022 1:42 PM    [facility]

## 2022-03-13 NOTE — Telephone Encounter (Signed)
Last refill was on 12/22/21 per EPIC medication list.

## 2022-03-13 NOTE — Patient Instructions (Signed)
Medication Instructions:  Your physician recommends that you continue on your current medications as directed. Please refer to the Current Medication list given to you today.  *If you need a refill on your cardiac medications before your next appointment, please call your pharmacy*   Lab Work: NONE If you have labs (blood work) drawn today and your tests are completely normal, you will receive your results only by: [place] (if you have MyChart) OR A paper copy in the mail If you have any lab test that is abnormal or we need to change your treatment, we will call you to review the results.   Testing/Procedures: NONE   Follow-Up: At [facility], you and your health needs are our priority.  As part of our continuing mission to provide you with exceptional heart care, we have created designated Provider Care Teams.  These Care Teams include your primary Cardiologist (physician) and Advanced Practice Providers (APPs -  Physician Assistants and Nurse Practitioners) who all work together to provide you with the care you need, when you need it.  We recommend signing up for the patient portal called "MyChart".  Sign up information is provided on this After Visit Summary.  MyChart is used to connect with patients for Virtual Visits (Telemedicine).  Patients are able to view lab/test results, encounter notes, upcoming appointments, etc.  Non-urgent messages can be sent to your provider as well.   To learn more about what you can do with MyChart, go to [email or web address].    Your next appointment:   6 month(s)  Provider:   Janina Mayo, MD

## 2022-03-14 ENCOUNTER — Telehealth: Payer: Self-pay

## 2022-03-14 DIAGNOSIS — E871 Hypo-osmolality and hyponatremia: Secondary | ICD-10-CM | POA: Diagnosis not present

## 2022-03-14 LAB — VITAMIN D 25 HYDROXY (VIT D DEFICIENCY, FRACTURES): Vit D, 25-Hydroxy: 16

## 2022-03-14 NOTE — Telephone Encounter (Signed)
Beth, nurse with [name] left message on clinical intake voicemail requesting to extend nursing for 1 time a week for 3 weeks.  Message routed to Marlowe Sax, NP

## 2022-03-14 NOTE — Telephone Encounter (Signed)
Okay to give verbal orders per [facility] policy.

## 2022-03-15 ENCOUNTER — Encounter: Payer: Self-pay | Admitting: *Deleted

## 2022-03-15 DIAGNOSIS — M79671 Pain in right foot: Secondary | ICD-10-CM | POA: Diagnosis not present

## 2022-03-15 DIAGNOSIS — M79672 Pain in left foot: Secondary | ICD-10-CM | POA: Diagnosis not present

## 2022-03-15 DIAGNOSIS — E1140 Type 2 diabetes mellitus with diabetic neuropathy, unspecified: Secondary | ICD-10-CM | POA: Diagnosis not present

## 2022-03-15 DIAGNOSIS — B351 Tinea unguium: Secondary | ICD-10-CM | POA: Diagnosis not present

## 2022-03-15 DIAGNOSIS — L600 Ingrowing nail: Secondary | ICD-10-CM | POA: Diagnosis not present

## 2022-03-15 NOTE — Telephone Encounter (Signed)
Beth notified and agreed.

## 2022-03-15 NOTE — Telephone Encounter (Signed)
PDMP reviewed alprazolam refilled 03/10/2022.

## 2022-03-16 ENCOUNTER — Ambulatory Visit (INDEPENDENT_AMBULATORY_CARE_PROVIDER_SITE_OTHER): Payer: Medicare PPO

## 2022-03-16 DIAGNOSIS — I442 Atrioventricular block, complete: Secondary | ICD-10-CM

## 2022-03-16 LAB — CUP PACEART REMOTE DEVICE CHECK
Battery Remaining Longevity: 92 mo
Battery Remaining Percentage: 95.5 %
Battery Voltage: 2.99 V
Brady Statistic AP VP Percent: 44 %
Brady Statistic AP VS Percent: 1 %
Brady Statistic AS VP Percent: 56 %
Brady Statistic AS VS Percent: 1 %
Brady Statistic RA Percent Paced: 42 %
Brady Statistic RV Percent Paced: 99 %
Date Time Interrogation Session: 20240126095418
Implantable Lead Connection Status: 753985
Implantable Lead Connection Status: 753985
Implantable Lead Implant Date: 20230728
Implantable Lead Implant Date: 20230728
Implantable Lead Location: 753859
Implantable Lead Location: 753860
Implantable Lead Model: 3830
Implantable Pulse Generator Implant Date: 20230728
Lead Channel Impedance Value: 460 Ohm
Lead Channel Impedance Value: 490 Ohm
Lead Channel Pacing Threshold Amplitude: 0.75 V
Lead Channel Pacing Threshold Amplitude: 1 V
Lead Channel Pacing Threshold Pulse Width: 0.5 ms
Lead Channel Pacing Threshold Pulse Width: 0.5 ms
Lead Channel Sensing Intrinsic Amplitude: 3.5 mV
Lead Channel Sensing Intrinsic Amplitude: 8 mV
Lead Channel Setting Pacing Amplitude: 1.75 V
Lead Channel Setting Pacing Amplitude: 2.5 V
Lead Channel Setting Pacing Pulse Width: 0.5 ms
Lead Channel Setting Sensing Sensitivity: 2.5 mV
Pulse Gen Model: 2272
Pulse Gen Serial Number: 8101667

## 2022-03-18 ENCOUNTER — Other Ambulatory Visit: Payer: Self-pay | Admitting: Family

## 2022-03-18 DIAGNOSIS — E119 Type 2 diabetes mellitus without complications: Secondary | ICD-10-CM | POA: Diagnosis not present

## 2022-03-18 DIAGNOSIS — Z933 Colostomy status: Secondary | ICD-10-CM | POA: Diagnosis not present

## 2022-03-18 DIAGNOSIS — Z794 Long term (current) use of insulin: Secondary | ICD-10-CM | POA: Diagnosis not present

## 2022-03-18 DIAGNOSIS — E118 Type 2 diabetes mellitus with unspecified complications: Secondary | ICD-10-CM | POA: Diagnosis not present

## 2022-03-18 DIAGNOSIS — K56609 Unspecified intestinal obstruction, unspecified as to partial versus complete obstruction: Secondary | ICD-10-CM | POA: Diagnosis not present

## 2022-03-19 ENCOUNTER — Telehealth: Payer: Self-pay | Admitting: Family

## 2022-03-19 DIAGNOSIS — E559 Vitamin D deficiency, unspecified: Secondary | ICD-10-CM

## 2022-03-19 MED ORDER — VITAMIN D (ERGOCALCIFEROL) 1.25 MG (50000 UNIT) PO CAPS: 50000.0000 [IU] | ORAL_CAPSULE | ORAL | 5 refills | Status: DC

## 2022-03-19 NOTE — Telephone Encounter (Signed)
-----  Message from Albertina Senegal May, [place] sent at 03/15/2022  3:58 PM EST ----- Abstracted to patient's chart.

## 2022-03-19 NOTE — Telephone Encounter (Signed)
Labs drawn by Home health Nurse received vitamin D levels and PTH high.Recommend increasing vitamin D from 2000 units to 50,000 units once per week.script send to pharmacy.

## 2022-03-20 ENCOUNTER — Other Ambulatory Visit: Payer: Medicare PPO

## 2022-03-20 NOTE — Telephone Encounter (Signed)
Patient was advised and states that the vitamin D is getting delivered to him today,03/20/22 per pharmacy.

## 2022-03-26 ENCOUNTER — Other Ambulatory Visit: Payer: Self-pay | Admitting: Family

## 2022-03-27 ENCOUNTER — Telehealth: Payer: Self-pay

## 2022-03-27 DIAGNOSIS — G4733 Obstructive sleep apnea (adult) (pediatric): Secondary | ICD-10-CM | POA: Diagnosis not present

## 2022-03-27 NOTE — Telephone Encounter (Signed)
Detailed message left on voicemail for Jesse [name] with [name]'s response.

## 2022-03-27 NOTE — Telephone Encounter (Signed)
Incoming call received from [facility] with [name] stating she has 2 concerns in which a directive from Webb Silversmith is needed:  1.) Patient did not read the instructions for his prescribed vitamin D and took the 50,000 units once daily x 5 days and he was suppose to take it once a week for 5 weeks.   Once patient finished with the prescribed dose he began taking 200 mcg over the counter, in which Beth has instructed him to stop, for now. Please advise   2.) Patients blood sugar fluctuates, recently it was 160 (fasting). Blood sugar is usually in the 200's (fasting), however it's been as high as 345 (fasting). Patient took the liberty to increase his Levemir to 50 units x the last week. Please advise

## 2022-03-27 NOTE — Telephone Encounter (Signed)
-  Advised patient to avoid changing medication prior to consulting with provider. - discontinue vitamin D 50,000 units then start on vit D 1000 units one by mouth daily  - continue on Levemir 50 units SQ daily at bedtime - schedule 2 weeks follow up appointment to evaluate blood sugars.

## 2022-03-28 NOTE — Telephone Encounter (Signed)
I requested for patient to schedule appointment in 2 weeks unless beth can draw blood at home.Then check Vitamin D level.

## 2022-03-28 NOTE — Telephone Encounter (Signed)
Beth returned the call and stated that she does not feel comfortable with [name]'s response to patient. Beth stated she wanted to emphasize again to Conrad that the patient took 50,000 units of vitamin D every day for 5 days, when it was suppose to be once a week for 5 weeks.  Patient had a total of 250,000 units of Vit D in a 5 day span because he did not read the instructions. Eustaquio Maize is concerned that Webb Silversmith now wants patient to take Vit D 1000 unties daily without checking a vit D level.   Beth asked that I send the message back to [name] once more to confirm her response.

## 2022-03-29 ENCOUNTER — Ambulatory Visit
Admission: RE | Admit: 2022-03-29 | Discharge: 2022-03-29 | Disposition: A | Discharge status: Home / Self Care | Payer: Medicare PPO | Source: Ambulatory Visit | Attending: Family | Admitting: Family

## 2022-03-29 ENCOUNTER — Ambulatory Visit
Admission: RE | Admit: 2022-03-29 | Discharge: 2022-03-29 | Disposition: A | Discharge status: Home / Self Care | Payer: Medicare PPO | Source: Ambulatory Visit | Attending: Nephrology | Admitting: Nephrology

## 2022-03-29 DIAGNOSIS — K7460 Unspecified cirrhosis of liver: Secondary | ICD-10-CM | POA: Diagnosis not present

## 2022-03-29 DIAGNOSIS — N189 Chronic kidney disease, unspecified: Secondary | ICD-10-CM | POA: Diagnosis not present

## 2022-03-29 DIAGNOSIS — N1832 Chronic kidney disease, stage 3b: Secondary | ICD-10-CM

## 2022-03-29 NOTE — Telephone Encounter (Signed)
Jesse [name], with [facility] notified and will check Vitamin D level.

## 2022-03-30 ENCOUNTER — Other Ambulatory Visit: Payer: Self-pay

## 2022-03-30 MED ORDER — LANTUS SOLOSTAR 100 UNIT/ML ~~LOC~~ SOPN: 50.0000 [IU] | PEN_INJECTOR | Freq: Every day | SUBCUTANEOUS | 5 refills | Status: DC

## 2022-03-30 NOTE — Telephone Encounter (Signed)
Discontinue levemir then start on Lantus 50 units injection daily.

## 2022-04-02 DIAGNOSIS — R350 Frequency of micturition: Secondary | ICD-10-CM | POA: Diagnosis not present

## 2022-04-02 DIAGNOSIS — N5201 Erectile dysfunction due to arterial insufficiency: Secondary | ICD-10-CM | POA: Diagnosis not present

## 2022-04-02 NOTE — Progress Notes (Signed)
Remote pacemaker transmission.   

## 2022-04-03 ENCOUNTER — Telehealth: Payer: Self-pay | Admitting: Internal Medicine

## 2022-04-03 ENCOUNTER — Telehealth: Payer: Self-pay | Admitting: *Deleted

## 2022-04-03 DIAGNOSIS — E871 Hypo-osmolality and hyponatremia: Secondary | ICD-10-CM | POA: Diagnosis not present

## 2022-04-03 LAB — VITAMIN D 25 HYDROXY (VIT D DEFICIENCY, FRACTURES): Vit D, 25-Hydroxy: 23.1

## 2022-04-03 NOTE — Telephone Encounter (Signed)
LMOM to return call.

## 2022-04-03 NOTE — Telephone Encounter (Signed)
Beth Notified and stated that they do not do the iFOBT.   Called patient and he stated that he does not feel like he needs to come into the office to be checked or come by and pick up a test.  Stated that he feels like his Ostomy was irritated and it was only alittle bit and once. Refused appointment.   Discussed [name]'s instructions to go to the ER if Bright red blood in stool recurs and he agreed.   FYI

## 2022-04-03 NOTE — Telephone Encounter (Signed)
Beth at [facility] reported that patient has gained 9 pounds in a week. Lungs are clear, BP 100/58, pt feels a hollowness in his chest with deep breaths. Beth stated that symptom, in the past, was related to pleural effusions. Please advise on wt gain and hypotension.

## 2022-04-03 NOTE — Telephone Encounter (Signed)
Beth with [facility] wanting to let you know that patient reported having an episode of bright red blood in stool 2 days ago and a Blood Pressure of 100/58 with no other complaint.   Patient states that he is having good stool output.   Please Advise.

## 2022-04-03 NOTE — Telephone Encounter (Signed)
Pt c/o swelling: STAT is pt has developed SOB within 24 hours  If swelling, where is the swelling located? Legs  How much weight have you gained and in what time span? 9 lbs in a week  Have you gained 3 pounds in a day or 5 pounds in a week? No  Do you have a log of your daily weights (if so, list)? No  Are you currently taking a fluid pill? Yes  Are you currently SOB? No  Have you traveled recently? No   Nurse also states that pt's BP was low today (100/58). She states that pt says when he takes deep breath he gets a "hollow" feeling. She would like a callback regarding this matter

## 2022-04-03 NOTE — Telephone Encounter (Signed)
Noted  

## 2022-04-03 NOTE — Telephone Encounter (Signed)
HHN to collect stool for iFOBT.Recommend ED evaluation if bright red blood in stool recurs.

## 2022-04-05 ENCOUNTER — Telehealth: Payer: Self-pay

## 2022-04-05 ENCOUNTER — Encounter: Payer: Self-pay | Admitting: Family

## 2022-04-05 ENCOUNTER — Other Ambulatory Visit: Payer: Self-pay | Admitting: Family

## 2022-04-05 DIAGNOSIS — E559 Vitamin D deficiency, unspecified: Secondary | ICD-10-CM

## 2022-04-05 MED ORDER — VITAMIN D3 25 MCG (1000 UT) PO CAPS: 1000.0000 [IU] | ORAL_CAPSULE | Freq: Every day | ORAL | 5 refills | Status: DC

## 2022-04-05 NOTE — Telephone Encounter (Signed)
Spoke with patient and shared [name]'s response. Jesse [name] verbalized understanding

## 2022-04-05 NOTE — Telephone Encounter (Signed)
Incoming call received from [place] with [facility]. Patient had vit D level drawn recently and it is still low. Please advise on treatment plan (see media tab)   Vit D Level 23.1

## 2022-04-05 NOTE — Telephone Encounter (Signed)
Start on Vitamin D 1000 units one by mouth daily.   - Discontinue vit D 50,000 units

## 2022-04-05 NOTE — Telephone Encounter (Signed)
Medication last refilled 01/05/22. Patient has up to date contract on file.  Medication pended and sent to Marlowe Sax, NP

## 2022-04-06 ENCOUNTER — Other Ambulatory Visit: Payer: Self-pay | Admitting: Family

## 2022-04-06 DIAGNOSIS — E119 Type 2 diabetes mellitus without complications: Secondary | ICD-10-CM | POA: Diagnosis not present

## 2022-04-06 DIAGNOSIS — E118 Type 2 diabetes mellitus with unspecified complications: Secondary | ICD-10-CM | POA: Diagnosis not present

## 2022-04-06 DIAGNOSIS — Z794 Long term (current) use of insulin: Secondary | ICD-10-CM | POA: Diagnosis not present

## 2022-04-06 DIAGNOSIS — Z933 Colostomy status: Secondary | ICD-10-CM | POA: Diagnosis not present

## 2022-04-06 DIAGNOSIS — K56609 Unspecified intestinal obstruction, unspecified as to partial versus complete obstruction: Secondary | ICD-10-CM | POA: Diagnosis not present

## 2022-04-07 DIAGNOSIS — Z794 Long term (current) use of insulin: Secondary | ICD-10-CM | POA: Diagnosis not present

## 2022-04-07 DIAGNOSIS — Z933 Colostomy status: Secondary | ICD-10-CM | POA: Diagnosis not present

## 2022-04-07 DIAGNOSIS — E118 Type 2 diabetes mellitus with unspecified complications: Secondary | ICD-10-CM | POA: Diagnosis not present

## 2022-04-07 DIAGNOSIS — E119 Type 2 diabetes mellitus without complications: Secondary | ICD-10-CM | POA: Diagnosis not present

## 2022-04-09 ENCOUNTER — Other Ambulatory Visit: Payer: Self-pay

## 2022-04-09 DIAGNOSIS — Z433 Encounter for attention to colostomy: Secondary | ICD-10-CM

## 2022-04-10 ENCOUNTER — Other Ambulatory Visit: Payer: Self-pay | Admitting: Adult Health

## 2022-04-10 DIAGNOSIS — M25511 Pain in right shoulder: Secondary | ICD-10-CM

## 2022-04-10 NOTE — Telephone Encounter (Signed)
Returned call to [place] at [facility]- advised her on the following from Dr. Harl Bowie.   Janina Mayo, MD  You; Kathreen Devoid, RN5 days ago   MB Unless he is having significant symptoms, I don't recommend any changes. He and I have discussed this.  Advised her to call back with any concerns.

## 2022-04-11 ENCOUNTER — Telehealth: Payer: Self-pay

## 2022-04-11 NOTE — Telephone Encounter (Signed)
Beth from [facility] called and wanted verbal orders for Nursing for 1 additional week. She states that patient has blood in his ostomy bag. Verbal orders given.

## 2022-04-13 ENCOUNTER — Ambulatory Visit (HOSPITAL_COMMUNITY)
Admission: RE | Admit: 2022-04-13 | Discharge: 2022-04-13 | Disposition: A | Discharge status: Home / Self Care | Payer: Medicare PPO | Source: Ambulatory Visit | Attending: Family | Admitting: Family

## 2022-04-13 DIAGNOSIS — K435 Parastomal hernia without obstruction or  gangrene: Secondary | ICD-10-CM | POA: Diagnosis not present

## 2022-04-13 DIAGNOSIS — K9401 Colostomy hemorrhage: Secondary | ICD-10-CM

## 2022-04-13 DIAGNOSIS — K9400 Colostomy complication, unspecified: Secondary | ICD-10-CM

## 2022-04-13 DIAGNOSIS — R58 Hemorrhage, not elsewhere classified: Secondary | ICD-10-CM | POA: Diagnosis not present

## 2022-04-13 DIAGNOSIS — Z433 Encounter for attention to colostomy: Secondary | ICD-10-CM | POA: Diagnosis not present

## 2022-04-13 MED ORDER — SILVER NITRATE-POT NITRATE 75-25 % EX MISC
1.0000 | CUTANEOUS | Status: DC | PRN
  Filled 2022-04-13: qty 1

## 2022-04-13 NOTE — Progress Notes (Signed)
[facility]   Reason for visit:  RLQ transverse colostomy  Stoma relocated once.  Large parastomal hernia present, recessed stoma, obese abdomen HPI:   Past Medical History:  Diagnosis Date   Alcohol use disorder in remission    Anxiety and depression    At high risk for falls    Former cigarette smoker    Generalized anxiety disorder    Hard of hearing    Hyperlipidemia due to type 2 diabetes mellitus (HCC)    Hypertension    Morbid obesity (HCC)    Neuropathy    O2 dependent    Risk for falls    Sleep apnea    Type 2 diabetes mellitus (HCC)    Family History  Problem Relation Age of Onset   Diabetes Brother    Bipolar disorder Daughter    Anxiety disorder Son    Allergies  Allergen Reactions   Tape Rash    If left on for a long time   Current Outpatient Medications  Medication Sig Dispense Refill Last Dose   acetaminophen (TYLENOL) 325 MG tablet Take 2 tablets (650 mg total) by mouth every 6 (six) hours as needed for mild pain or moderate pain. 20 tablet 0    alfuzosin (UROXATRAL) 10 MG 24 hr tablet TAKE 1 TABLET BY MOUTH EVERY DAY 30 tablet 0    ALPRAZolam (XANAX) 0.25 MG tablet Take 2 tablets (0.5 mg total) by mouth 2 (two) times daily as needed for anxiety or sleep. 60 tablet 0    calcium carbonate (TUMS - DOSED IN MG ELEMENTAL CALCIUM) 500 MG chewable tablet Chew 1 tablet by mouth 2 (two) times daily.      Cholecalciferol (VITAMIN D3) 25 MCG (1000 UT) capsule Take 1 capsule (1,000 Units total) by mouth daily. 30 capsule 5    clopidogrel (PLAVIX) 75 MG tablet TAKE 1 TABLET(75 MG) BY MOUTH DAILY 30 tablet 5    cyanocobalamin 1000 MCG tablet Take 1,000 mcg by mouth daily.      cyclobenzaprine (FLEXERIL) 10 MG tablet Take 1 tablet (10 mg total) by mouth 3 (three) times daily as needed for muscle spasms. 15 tablet 0    diclofenac Sodium (VOLTAREN) 1 % GEL Apply 2 g topically 4 (four) times daily. Apply to your right neck and shoulder area 3-4 times a day as  needed 50 g 0    DULoxetine (CYMBALTA) 60 MG capsule TAKE 1 CAPSULE(60 MG) BY MOUTH DAILY 90 capsule 1    empagliflozin (JARDIANCE) 25 MG TABS tablet Take 25 mg by mouth daily.      FEROSUL 325 (65 Fe) MG tablet TAKE 1 TABLET(325 MG) BY MOUTH DAILY WITH BREAKFAST 30 tablet 5    furosemide (LASIX) 40 MG tablet Take 2 tablets (80 mg total) by mouth in the morning and at bedtime. 30 tablet     glucose blood (ACCU-CHEK GUIDE) test strip USE TO TEST BLOOD SUGAR ONCE DAILY 200 strip 2    insulin glargine (LANTUS SOLOSTAR) 100 UNIT/ML Solostar Pen Inject 50 Units into the skin at bedtime. 15 mL 5    Insulin Pen Needle 32G X 4 MM MISC Use to inject insulin twice daily. Dx: E11.22 200 each 3    isosorbide mononitrate (IMDUR) 60 MG 24 hr tablet Take 1 tablet (60 mg total) by mouth daily. 90 tablet 1    ketoconazole (NIZORAL) 2 % cream Apply 1 application topically as needed for irritation. 15 g 5    Lancet Device MISC  1 Device by Does not apply route daily. 1 each 11    Lancets MISC 1 Device by Does not apply route as directed. Use as directed. 100 each 2    loperamide (IMODIUM A-D) 2 MG capsule Take 1 capsule (2 mg total) by mouth 4 (four) times daily as needed for diarrhea or loose stools. 20 capsule 0    NYSTATIN powder Apply 1 application  topically daily as needed (Yeast infection to afffected area).      ondansetron (ZOFRAN) 4 MG tablet Take 1 tablet (4 mg total) by mouth every 8 (eight) hours as needed for nausea or vomiting. 20 tablet 0    OVER THE COUNTER MEDICATION Apply 1 Application topically every other day. Head and shoulder medicated shampoo alternate with selsun blue medicated      OXYGEN Inhale 2 L into the lungs continuous.      pantoprazole (PROTONIX) 40 MG tablet Take 1 tablet (40 mg total) by mouth 2 (two) times daily before a meal. 30 tablet 3    potassium chloride SA (KLOR-CON M) 20 MEQ tablet Take 1 tablet (20 mEq total) by mouth 2 (two) times daily.      Red Yeast Rice 600 MG CAPS  Take 1,200 mg by mouth daily.      rOPINIRole (REQUIP) 2 MG tablet TAKE 1 TABLET(2 MG) BY MOUTH AT BEDTIME 90 tablet 3    rosuvastatin (CRESTOR) 10 MG tablet TAKE 1 TABLET BY MOUTH EVERY DAY 90 tablet 1    simethicone (MYLICON) 80 MG chewable tablet Chew 80 mg by mouth every 6 (six) hours as needed for flatulence.      tiZANidine (ZANAFLEX) 2 MG tablet TAKE 1 TABLET BY MOUTH TWICE DAILY 14 tablet 3    topiramate (TOPAMAX) 25 MG tablet TAKE 1 TABLET BY MOUTH EVERY DAY 90 tablet 2    traMADol (ULTRAM) 50 MG tablet TAKE 1 TABLET(50 MG) BY MOUTH TWICE DAILY AS NEEDED 60 tablet 3    traZODone (DESYREL) 50 MG tablet TAKE 2 TABLETS(100 MG) BY MOUTH AT BEDTIME 60 tablet 3    triamcinolone cream (KENALOG) 0.1 % Apply 1 application. topically 2 (two) times daily. Apply to affected areas on shin area 45 g 0    TURMERIC PO Take 1,000 mg by mouth daily.      VICTOZA 18 MG/3ML SOPN ADMINISTER 1.8 MG UNDER THE SKIN EVERY DAY 27 mL 5    No current facility-administered medications for this encounter.   ROS  Review of Systems  Gastrointestinal:        RMQ transverse colostomy  Musculoskeletal:        Wheelchair bound  Skin:  Positive for color change.       Peristomal bleeding  Hematological:        Stopped blood thinners on his own due to stomal bleeding. States he will start back today.    Vital signs:  BP 113/80 (BP Location: Left Arm)   Pulse 85   Temp 97.7 F (36.5 C) (Oral)   Resp 18   SpO2 95%  Exam:  Physical Exam Vitals reviewed.  Constitutional:      Appearance: He is obese.  Cardiovascular:     Rate and Rhythm: Normal rate and regular rhythm.  Abdominal:     Palpations: Abdomen is soft.     Hernia: A hernia is present.  Skin:    General: Skin is warm and dry.  Neurological:     Mental Status: He is alert and oriented  to person, place, and time.     Comments: Recent TIa  Psychiatric:        Mood and Affect: Mood normal.        Behavior: Behavior normal.     Stoma  type/location:  RMQ transverse colostomy, relocated once due to hernia Stomal assessment/size:  1 3/8" recessed, bleeds heavily with pouch changes.  Consistent with peristomal trauma.  Uses wooden stick to pack paste around stoma for pouching and may be traumatizing stoma  He has stopped blood thinner due to bleeding.  Due to recent TIA, I have strongly encouraged patient to continue on blood thinner for safety. He agrees to take this today.  Peristomal assessment:  creasing at 3 o'clock, fills in with paste.   Treatment options for stomal/peristomal skin: stoma paste to fil in creases and recessed stoma Output: soft brown stool Ostomy pouching: 1pc. Convex and paste. Stoma powder and skin prep Education provided:  we apply silver nitrate to bleeding stomal tissue and around peristomal skin that has bleeding granulomas present. Irrigated with NS after.   He applies pouch and seal with hair dryer that he has at appointment today.     Impression/dx  Bleeding stoma Recessed colostomy Discussion  He has used silver nitrate in the past.  He is able to do this at home.  [facility]  See back as needed.     Visit time: 50 minutes.   Maple Hudson FNP-BC

## 2022-04-13 NOTE — Discharge Instructions (Signed)
Discharged with silver nitrate and script called into [facility] [place]

## 2022-04-16 DIAGNOSIS — K9401 Colostomy hemorrhage: Secondary | ICD-10-CM | POA: Insufficient documentation

## 2022-04-16 DIAGNOSIS — K9400 Colostomy complication, unspecified: Secondary | ICD-10-CM | POA: Insufficient documentation

## 2022-04-18 ENCOUNTER — Other Ambulatory Visit: Payer: Self-pay | Admitting: Nurse Practitioner

## 2022-04-23 ENCOUNTER — Other Ambulatory Visit: Payer: Self-pay | Admitting: Family

## 2022-04-23 DIAGNOSIS — K219 Gastro-esophageal reflux disease without esophagitis: Secondary | ICD-10-CM

## 2022-04-25 DIAGNOSIS — E118 Type 2 diabetes mellitus with unspecified complications: Secondary | ICD-10-CM | POA: Diagnosis not present

## 2022-04-25 DIAGNOSIS — R41841 Cognitive communication deficit: Secondary | ICD-10-CM | POA: Diagnosis not present

## 2022-04-25 DIAGNOSIS — R1310 Dysphagia, unspecified: Secondary | ICD-10-CM | POA: Diagnosis not present

## 2022-04-25 DIAGNOSIS — G4733 Obstructive sleep apnea (adult) (pediatric): Secondary | ICD-10-CM | POA: Diagnosis not present

## 2022-04-25 DIAGNOSIS — R1312 Dysphagia, oropharyngeal phase: Secondary | ICD-10-CM | POA: Diagnosis not present

## 2022-04-25 DIAGNOSIS — E119 Type 2 diabetes mellitus without complications: Secondary | ICD-10-CM | POA: Diagnosis not present

## 2022-04-25 DIAGNOSIS — Z933 Colostomy status: Secondary | ICD-10-CM | POA: Diagnosis not present

## 2022-04-25 DIAGNOSIS — R488 Other symbolic dysfunctions: Secondary | ICD-10-CM | POA: Diagnosis not present

## 2022-04-25 DIAGNOSIS — K56609 Unspecified intestinal obstruction, unspecified as to partial versus complete obstruction: Secondary | ICD-10-CM | POA: Diagnosis not present

## 2022-04-25 DIAGNOSIS — I69328 Other speech and language deficits following cerebral infarction: Secondary | ICD-10-CM | POA: Diagnosis not present

## 2022-04-25 DIAGNOSIS — Z794 Long term (current) use of insulin: Secondary | ICD-10-CM | POA: Diagnosis not present

## 2022-04-26 DIAGNOSIS — I130 Hypertensive heart and chronic kidney disease with heart failure and stage 1 through stage 4 chronic kidney disease, or unspecified chronic kidney disease: Secondary | ICD-10-CM | POA: Diagnosis not present

## 2022-04-26 DIAGNOSIS — R600 Localized edema: Secondary | ICD-10-CM | POA: Diagnosis not present

## 2022-04-26 DIAGNOSIS — E559 Vitamin D deficiency, unspecified: Secondary | ICD-10-CM | POA: Diagnosis not present

## 2022-04-26 DIAGNOSIS — E876 Hypokalemia: Secondary | ICD-10-CM | POA: Diagnosis not present

## 2022-04-26 DIAGNOSIS — N1832 Chronic kidney disease, stage 3b: Secondary | ICD-10-CM | POA: Diagnosis not present

## 2022-04-26 DIAGNOSIS — I509 Heart failure, unspecified: Secondary | ICD-10-CM | POA: Diagnosis not present

## 2022-04-26 DIAGNOSIS — N2581 Secondary hyperparathyroidism of renal origin: Secondary | ICD-10-CM | POA: Diagnosis not present

## 2022-04-26 DIAGNOSIS — N189 Chronic kidney disease, unspecified: Secondary | ICD-10-CM | POA: Diagnosis not present

## 2022-04-26 DIAGNOSIS — R809 Proteinuria, unspecified: Secondary | ICD-10-CM | POA: Diagnosis not present

## 2022-04-26 DIAGNOSIS — D631 Anemia in chronic kidney disease: Secondary | ICD-10-CM | POA: Diagnosis not present

## 2022-04-27 ENCOUNTER — Telehealth: Payer: Self-pay

## 2022-04-27 NOTE — Telephone Encounter (Signed)
Recommend ED If symptoms worsen.

## 2022-04-27 NOTE — Telephone Encounter (Signed)
Called and informed patient of recommendation

## 2022-04-27 NOTE — Telephone Encounter (Signed)
Patient states that hw has been having cough and congestion for about 2 days. He states that he just found out that a close friend tht he eats meals with everyday has just been diagnosed with RSV. He has had his RSV vaccine. He would like to know what he should do or if there is something to take. He wants to know whether he needs to come in office or go to ER since he has a lot of co morbidities.  Message routed to Marlowe Sax, NP.

## 2022-05-03 ENCOUNTER — Other Ambulatory Visit: Payer: Self-pay | Admitting: Family

## 2022-05-03 DIAGNOSIS — Z794 Long term (current) use of insulin: Secondary | ICD-10-CM | POA: Diagnosis not present

## 2022-05-03 DIAGNOSIS — E118 Type 2 diabetes mellitus with unspecified complications: Secondary | ICD-10-CM | POA: Diagnosis not present

## 2022-05-03 DIAGNOSIS — Z933 Colostomy status: Secondary | ICD-10-CM | POA: Diagnosis not present

## 2022-05-03 DIAGNOSIS — E119 Type 2 diabetes mellitus without complications: Secondary | ICD-10-CM | POA: Diagnosis not present

## 2022-05-03 DIAGNOSIS — K56609 Unspecified intestinal obstruction, unspecified as to partial versus complete obstruction: Secondary | ICD-10-CM | POA: Diagnosis not present

## 2022-05-04 ENCOUNTER — Other Ambulatory Visit: Payer: Self-pay | Admitting: Family

## 2022-05-04 DIAGNOSIS — E1142 Type 2 diabetes mellitus with diabetic polyneuropathy: Secondary | ICD-10-CM

## 2022-05-04 DIAGNOSIS — G8929 Other chronic pain: Secondary | ICD-10-CM

## 2022-05-04 NOTE — Telephone Encounter (Signed)
Pharmacy requested refill. Epic LR: 02/28/2022 Contract Date: 12/22/2021  Pended Rx and sent to [facility] for approval.

## 2022-05-08 DIAGNOSIS — C44321 Squamous cell carcinoma of skin of nose: Secondary | ICD-10-CM | POA: Diagnosis not present

## 2022-05-08 DIAGNOSIS — D485 Neoplasm of uncertain behavior of skin: Secondary | ICD-10-CM | POA: Diagnosis not present

## 2022-05-10 DIAGNOSIS — R41841 Cognitive communication deficit: Secondary | ICD-10-CM | POA: Diagnosis not present

## 2022-05-10 DIAGNOSIS — R1312 Dysphagia, oropharyngeal phase: Secondary | ICD-10-CM | POA: Diagnosis not present

## 2022-05-10 DIAGNOSIS — R488 Other symbolic dysfunctions: Secondary | ICD-10-CM | POA: Diagnosis not present

## 2022-05-10 DIAGNOSIS — R1310 Dysphagia, unspecified: Secondary | ICD-10-CM | POA: Diagnosis not present

## 2022-05-10 DIAGNOSIS — I69328 Other speech and language deficits following cerebral infarction: Secondary | ICD-10-CM | POA: Diagnosis not present

## 2022-05-16 DIAGNOSIS — R1310 Dysphagia, unspecified: Secondary | ICD-10-CM | POA: Diagnosis not present

## 2022-05-16 DIAGNOSIS — R41841 Cognitive communication deficit: Secondary | ICD-10-CM | POA: Diagnosis not present

## 2022-05-16 DIAGNOSIS — I69328 Other speech and language deficits following cerebral infarction: Secondary | ICD-10-CM | POA: Diagnosis not present

## 2022-05-16 DIAGNOSIS — R1312 Dysphagia, oropharyngeal phase: Secondary | ICD-10-CM | POA: Diagnosis not present

## 2022-05-16 DIAGNOSIS — R488 Other symbolic dysfunctions: Secondary | ICD-10-CM | POA: Diagnosis not present

## 2022-05-17 DIAGNOSIS — D0439 Carcinoma in situ of skin of other parts of face: Secondary | ICD-10-CM | POA: Diagnosis not present

## 2022-05-21 ENCOUNTER — Inpatient Hospital Stay (HOSPITAL_COMMUNITY)
Admission: EM | Admit: 2022-05-21 | Discharge: 2022-05-23 | DRG: 389 | Disposition: A | Discharge status: Home Health | Payer: Medicare PPO | Attending: Internal Medicine | Admitting: Internal Medicine

## 2022-05-21 ENCOUNTER — Emergency Department (HOSPITAL_COMMUNITY): Payer: Medicare PPO

## 2022-05-21 ENCOUNTER — Inpatient Hospital Stay (HOSPITAL_COMMUNITY): Payer: Medicare PPO

## 2022-05-21 ENCOUNTER — Encounter (HOSPITAL_COMMUNITY): Payer: Self-pay

## 2022-05-21 DIAGNOSIS — K56609 Unspecified intestinal obstruction, unspecified as to partial versus complete obstruction: Principal | ICD-10-CM | POA: Diagnosis present

## 2022-05-21 DIAGNOSIS — E785 Hyperlipidemia, unspecified: Secondary | ICD-10-CM | POA: Diagnosis present

## 2022-05-21 DIAGNOSIS — E1169 Type 2 diabetes mellitus with other specified complication: Secondary | ICD-10-CM | POA: Diagnosis present

## 2022-05-21 DIAGNOSIS — J9610 Chronic respiratory failure, unspecified whether with hypoxia or hypercapnia: Secondary | ICD-10-CM | POA: Diagnosis present

## 2022-05-21 DIAGNOSIS — F1011 Alcohol abuse, in remission: Secondary | ICD-10-CM | POA: Diagnosis present

## 2022-05-21 DIAGNOSIS — Z833 Family history of diabetes mellitus: Secondary | ICD-10-CM

## 2022-05-21 DIAGNOSIS — Z79899 Other long term (current) drug therapy: Secondary | ICD-10-CM

## 2022-05-21 DIAGNOSIS — K6389 Other specified diseases of intestine: Secondary | ICD-10-CM | POA: Diagnosis not present

## 2022-05-21 DIAGNOSIS — N261 Atrophy of kidney (terminal): Secondary | ICD-10-CM | POA: Diagnosis not present

## 2022-05-21 DIAGNOSIS — J9811 Atelectasis: Secondary | ICD-10-CM | POA: Diagnosis present

## 2022-05-21 DIAGNOSIS — I495 Sick sinus syndrome: Secondary | ICD-10-CM | POA: Diagnosis not present

## 2022-05-21 DIAGNOSIS — J9611 Chronic respiratory failure with hypoxia: Secondary | ICD-10-CM | POA: Diagnosis not present

## 2022-05-21 DIAGNOSIS — N184 Chronic kidney disease, stage 4 (severe): Secondary | ICD-10-CM | POA: Diagnosis not present

## 2022-05-21 DIAGNOSIS — R1032 Left lower quadrant pain: Secondary | ICD-10-CM | POA: Diagnosis present

## 2022-05-21 DIAGNOSIS — N400 Enlarged prostate without lower urinary tract symptoms: Secondary | ICD-10-CM | POA: Diagnosis present

## 2022-05-21 DIAGNOSIS — Z9049 Acquired absence of other specified parts of digestive tract: Secondary | ICD-10-CM | POA: Diagnosis not present

## 2022-05-21 DIAGNOSIS — Z950 Presence of cardiac pacemaker: Secondary | ICD-10-CM

## 2022-05-21 DIAGNOSIS — I5032 Chronic diastolic (congestive) heart failure: Secondary | ICD-10-CM | POA: Diagnosis not present

## 2022-05-21 DIAGNOSIS — E1142 Type 2 diabetes mellitus with diabetic polyneuropathy: Secondary | ICD-10-CM | POA: Diagnosis present

## 2022-05-21 DIAGNOSIS — Z9981 Dependence on supplemental oxygen: Secondary | ICD-10-CM

## 2022-05-21 DIAGNOSIS — K436 Other and unspecified ventral hernia with obstruction, without gangrene: Secondary | ICD-10-CM | POA: Diagnosis not present

## 2022-05-21 DIAGNOSIS — N2889 Other specified disorders of kidney and ureter: Secondary | ICD-10-CM | POA: Diagnosis present

## 2022-05-21 DIAGNOSIS — Z87891 Personal history of nicotine dependence: Secondary | ICD-10-CM | POA: Diagnosis not present

## 2022-05-21 DIAGNOSIS — Z7401 Bed confinement status: Secondary | ICD-10-CM | POA: Diagnosis not present

## 2022-05-21 DIAGNOSIS — Z955 Presence of coronary angioplasty implant and graft: Secondary | ICD-10-CM

## 2022-05-21 DIAGNOSIS — I130 Hypertensive heart and chronic kidney disease with heart failure and stage 1 through stage 4 chronic kidney disease, or unspecified chronic kidney disease: Secondary | ICD-10-CM | POA: Diagnosis not present

## 2022-05-21 DIAGNOSIS — Z4659 Encounter for fitting and adjustment of other gastrointestinal appliance and device: Secondary | ICD-10-CM | POA: Diagnosis not present

## 2022-05-21 DIAGNOSIS — G4733 Obstructive sleep apnea (adult) (pediatric): Secondary | ICD-10-CM | POA: Diagnosis present

## 2022-05-21 DIAGNOSIS — R739 Hyperglycemia, unspecified: Secondary | ICD-10-CM | POA: Diagnosis not present

## 2022-05-21 DIAGNOSIS — E669 Obesity, unspecified: Secondary | ICD-10-CM | POA: Diagnosis not present

## 2022-05-21 DIAGNOSIS — F32A Depression, unspecified: Secondary | ICD-10-CM | POA: Diagnosis present

## 2022-05-21 DIAGNOSIS — C649 Malignant neoplasm of unspecified kidney, except renal pelvis: Secondary | ICD-10-CM | POA: Insufficient documentation

## 2022-05-21 DIAGNOSIS — K56600 Partial intestinal obstruction, unspecified as to cause: Secondary | ICD-10-CM | POA: Diagnosis not present

## 2022-05-21 DIAGNOSIS — Z933 Colostomy status: Secondary | ICD-10-CM

## 2022-05-21 DIAGNOSIS — Z7984 Long term (current) use of oral hypoglycemic drugs: Secondary | ICD-10-CM

## 2022-05-21 DIAGNOSIS — E662 Morbid (severe) obesity with alveolar hypoventilation: Secondary | ICD-10-CM | POA: Diagnosis present

## 2022-05-21 DIAGNOSIS — Z8673 Personal history of transient ischemic attack (TIA), and cerebral infarction without residual deficits: Secondary | ICD-10-CM

## 2022-05-21 DIAGNOSIS — R1084 Generalized abdominal pain: Secondary | ICD-10-CM | POA: Diagnosis not present

## 2022-05-21 DIAGNOSIS — Z6841 Body Mass Index (BMI) 40.0 and over, adult: Secondary | ICD-10-CM

## 2022-05-21 DIAGNOSIS — R140 Abdominal distension (gaseous): Secondary | ICD-10-CM | POA: Diagnosis not present

## 2022-05-21 DIAGNOSIS — Z433 Encounter for attention to colostomy: Secondary | ICD-10-CM

## 2022-05-21 DIAGNOSIS — R509 Fever, unspecified: Secondary | ICD-10-CM | POA: Diagnosis not present

## 2022-05-21 DIAGNOSIS — I2510 Atherosclerotic heart disease of native coronary artery without angina pectoris: Secondary | ICD-10-CM | POA: Diagnosis present

## 2022-05-21 DIAGNOSIS — I4430 Unspecified atrioventricular block: Secondary | ICD-10-CM | POA: Diagnosis present

## 2022-05-21 DIAGNOSIS — E1122 Type 2 diabetes mellitus with diabetic chronic kidney disease: Secondary | ICD-10-CM | POA: Diagnosis present

## 2022-05-21 DIAGNOSIS — I959 Hypotension, unspecified: Secondary | ICD-10-CM | POA: Diagnosis not present

## 2022-05-21 DIAGNOSIS — K439 Ventral hernia without obstruction or gangrene: Secondary | ICD-10-CM | POA: Diagnosis present

## 2022-05-21 DIAGNOSIS — F411 Generalized anxiety disorder: Secondary | ICD-10-CM | POA: Diagnosis present

## 2022-05-21 DIAGNOSIS — Z794 Long term (current) use of insulin: Secondary | ICD-10-CM | POA: Diagnosis not present

## 2022-05-21 DIAGNOSIS — R110 Nausea: Secondary | ICD-10-CM | POA: Diagnosis not present

## 2022-05-21 DIAGNOSIS — I442 Atrioventricular block, complete: Secondary | ICD-10-CM | POA: Diagnosis present

## 2022-05-21 DIAGNOSIS — Z7985 Long-term (current) use of injectable non-insulin antidiabetic drugs: Secondary | ICD-10-CM

## 2022-05-21 DIAGNOSIS — R262 Difficulty in walking, not elsewhere classified: Secondary | ICD-10-CM | POA: Diagnosis present

## 2022-05-21 DIAGNOSIS — Z7902 Long term (current) use of antithrombotics/antiplatelets: Secondary | ICD-10-CM

## 2022-05-21 DIAGNOSIS — Z993 Dependence on wheelchair: Secondary | ICD-10-CM

## 2022-05-21 DIAGNOSIS — K5669 Other partial intestinal obstruction: Secondary | ICD-10-CM | POA: Diagnosis not present

## 2022-05-21 DIAGNOSIS — E66813 Obesity, class 3: Secondary | ICD-10-CM | POA: Diagnosis present

## 2022-05-21 DIAGNOSIS — Z9109 Other allergy status, other than to drugs and biological substances: Secondary | ICD-10-CM

## 2022-05-21 LAB — CBC WITH DIFFERENTIAL/PLATELET
Abs Immature Granulocytes: 0.05 10*3/uL (ref 0.00–0.07)
Basophils Absolute: 0.1 10*3/uL (ref 0.0–0.1)
Basophils Relative: 1 %
Eosinophils Absolute: 0.2 10*3/uL (ref 0.0–0.5)
Eosinophils Relative: 2 %
HCT: 36.4 % — ABNORMAL LOW (ref 39.0–52.0)
Hemoglobin: 11 g/dL — ABNORMAL LOW (ref 13.0–17.0)
Immature Granulocytes: 1 %
Lymphocytes Relative: 6 %
Lymphs Abs: 0.7 10*3/uL (ref 0.7–4.0)
MCH: 28.2 pg (ref 26.0–34.0)
MCHC: 30.2 g/dL (ref 30.0–36.0)
MCV: 93.3 fL (ref 80.0–100.0)
Monocytes Absolute: 1.1 10*3/uL — ABNORMAL HIGH (ref 0.1–1.0)
Monocytes Relative: 10 %
Neutro Abs: 8.9 10*3/uL — ABNORMAL HIGH (ref 1.7–7.7)
Neutrophils Relative %: 80 %
Platelets: 209 10*3/uL (ref 150–400)
RBC: 3.9 MIL/uL — ABNORMAL LOW (ref 4.22–5.81)
RDW: 13.8 % (ref 11.5–15.5)
WBC: 11 10*3/uL — ABNORMAL HIGH (ref 4.0–10.5)
nRBC: 0 % (ref 0.0–0.2)

## 2022-05-21 LAB — COMPREHENSIVE METABOLIC PANEL
ALT: 14 U/L (ref 0–44)
AST: 14 U/L — ABNORMAL LOW (ref 15–41)
Albumin: 3.2 g/dL — ABNORMAL LOW (ref 3.5–5.0)
Alkaline Phosphatase: 80 U/L (ref 38–126)
Anion gap: 9 (ref 5–15)
BUN: 26 mg/dL — ABNORMAL HIGH (ref 8–23)
CO2: 22 mmol/L (ref 22–32)
Calcium: 8.5 mg/dL — ABNORMAL LOW (ref 8.9–10.3)
Chloride: 102 mmol/L (ref 98–111)
Creatinine, Ser: 1.7 mg/dL — ABNORMAL HIGH (ref 0.61–1.24)
GFR, Estimated: 42 mL/min — ABNORMAL LOW (ref >60)
Glucose, Bld: 263 mg/dL — ABNORMAL HIGH (ref 70–99)
Potassium: 4.1 mmol/L (ref 3.5–5.1)
Sodium: 133 mmol/L — ABNORMAL LOW (ref 135–145)
Total Bilirubin: 0.4 mg/dL (ref 0.3–1.2)
Total Protein: 6.5 g/dL (ref 6.5–8.1)

## 2022-05-21 LAB — GLUCOSE, CAPILLARY
Glucose-Capillary: 285 mg/dL — ABNORMAL HIGH (ref 70–99)
Glucose-Capillary: 269 mg/dL — ABNORMAL HIGH (ref 70–99)
Glucose-Capillary: 225 mg/dL — ABNORMAL HIGH (ref 70–99)
Glucose-Capillary: 179 mg/dL — ABNORMAL HIGH (ref 70–99)

## 2022-05-21 LAB — LIPASE, BLOOD: Lipase: 60 U/L — ABNORMAL HIGH (ref 11–51)

## 2022-05-21 MED ORDER — DIATRIZOATE MEGLUMINE & SODIUM 66-10 % PO SOLN
90.0000 mL | Freq: Once | ORAL | Status: DC
  Filled 2022-05-21: qty 90

## 2022-05-21 MED ORDER — HYDRALAZINE HCL 20 MG/ML IJ SOLN: 10.0000 mg | INTRAMUSCULAR | Status: DC | PRN

## 2022-05-21 MED ORDER — SODIUM CHLORIDE 0.9 % IV SOLN
12.5000 mg | Freq: Four times a day (QID) | INTRAVENOUS | Status: DC | PRN
  Administered 2022-05-21 (×2): 12.5 mg via INTRAVENOUS
  Filled 2022-05-21 (×2): qty 0.5
  Filled 2022-05-21: qty 12.5
  Filled 2022-05-21: qty 0.5

## 2022-05-21 MED ORDER — ISOSORBIDE MONONITRATE ER 60 MG PO TB24
60.0000 mg | ORAL_TABLET | Freq: Every day | ORAL | Status: DC
  Administered 2022-05-23: 60 mg via ORAL
  Filled 2022-05-21: qty 1

## 2022-05-21 MED ORDER — INSULIN ASPART 100 UNIT/ML IJ SOLN: 0.0000 [IU] | Freq: Every day | INTRAMUSCULAR | Status: DC

## 2022-05-21 MED ORDER — ROSUVASTATIN CALCIUM 5 MG PO TABS
10.0000 mg | ORAL_TABLET | Freq: Every day | ORAL | Status: DC
  Administered 2022-05-23: 10 mg via ORAL
  Filled 2022-05-21: qty 2

## 2022-05-21 MED ORDER — MORPHINE SULFATE (PF) 2 MG/ML IV SOLN
2.0000 mg | INTRAVENOUS | Status: DC | PRN
  Administered 2022-05-21 – 2022-05-22 (×8): 2 mg via INTRAVENOUS
  Filled 2022-05-21 (×8): qty 1

## 2022-05-21 MED ORDER — DULOXETINE HCL 60 MG PO CPEP
60.0000 mg | ORAL_CAPSULE | Freq: Every day | ORAL | Status: DC
  Administered 2022-05-23: 60 mg via ORAL
  Filled 2022-05-21: qty 1

## 2022-05-21 MED ORDER — INSULIN GLARGINE-YFGN 100 UNIT/ML ~~LOC~~ SOLN
50.0000 [IU] | Freq: Every day | SUBCUTANEOUS | Status: DC
  Administered 2022-05-21 – 2022-05-22 (×2): 50 [IU] via SUBCUTANEOUS
  Filled 2022-05-21 (×3): qty 0.5

## 2022-05-21 MED ORDER — PANTOPRAZOLE SODIUM 40 MG IV SOLR
40.0000 mg | Freq: Every day | INTRAVENOUS | Status: DC
  Administered 2022-05-21 – 2022-05-23 (×2): 40 mg via INTRAVENOUS
  Filled 2022-05-21 (×2): qty 10

## 2022-05-21 MED ORDER — ONDANSETRON HCL 4 MG/2ML IJ SOLN
4.0000 mg | Freq: Four times a day (QID) | INTRAMUSCULAR | Status: AC | PRN
  Administered 2022-05-21 (×2): 4 mg via INTRAVENOUS
  Filled 2022-05-21 (×2): qty 2

## 2022-05-21 MED ORDER — ACETAMINOPHEN 325 MG PO TABS
650.0000 mg | ORAL_TABLET | Freq: Four times a day (QID) | ORAL | Status: DC | PRN
  Administered 2022-05-23 (×2): 650 mg via ORAL
  Filled 2022-05-21 (×2): qty 2

## 2022-05-21 MED ORDER — ROPINIROLE HCL 0.5 MG PO TABS
2.0000 mg | ORAL_TABLET | Freq: Every day | ORAL | Status: DC
  Administered 2022-05-22: 2 mg via ORAL
  Filled 2022-05-21: qty 4

## 2022-05-21 MED ORDER — ONDANSETRON HCL 4 MG/2ML IJ SOLN
4.0000 mg | Freq: Four times a day (QID) | INTRAMUSCULAR | Status: DC | PRN
  Administered 2022-05-21: 4 mg via INTRAVENOUS
  Filled 2022-05-21: qty 2

## 2022-05-21 MED ORDER — ALFUZOSIN HCL ER 10 MG PO TB24
10.0000 mg | ORAL_TABLET | Freq: Every day | ORAL | Status: DC
  Administered 2022-05-23: 10 mg via ORAL
  Filled 2022-05-21 (×4): qty 1

## 2022-05-21 MED ORDER — LORAZEPAM 2 MG/ML IJ SOLN
1.0000 mg | INTRAMUSCULAR | Status: DC | PRN
  Administered 2022-05-21: 1 mg via INTRAVENOUS
  Filled 2022-05-21: qty 1

## 2022-05-21 MED ORDER — ACETAMINOPHEN 650 MG RE SUPP: 650.0000 mg | Freq: Four times a day (QID) | RECTAL | Status: DC | PRN

## 2022-05-21 MED ORDER — INSULIN ASPART 100 UNIT/ML IJ SOLN
0.0000 [IU] | Freq: Three times a day (TID) | INTRAMUSCULAR | Status: DC
  Administered 2022-05-21: 11 [IU] via SUBCUTANEOUS
  Administered 2022-05-21: 7 [IU] via SUBCUTANEOUS
  Administered 2022-05-22: 3 [IU] via SUBCUTANEOUS
  Administered 2022-05-22 (×2): 4 [IU] via SUBCUTANEOUS
  Administered 2022-05-23: 7 [IU] via SUBCUTANEOUS
  Administered 2022-05-23: 4 [IU] via SUBCUTANEOUS

## 2022-05-21 MED ORDER — IOHEXOL 9 MG/ML PO SOLN: 500.0000 mL | ORAL | Status: AC

## 2022-05-21 MED ORDER — PANTOPRAZOLE SODIUM 40 MG IV SOLR: 40.0000 mg | INTRAVENOUS | Status: DC

## 2022-05-21 MED ORDER — TOPIRAMATE 25 MG PO TABS
25.0000 mg | ORAL_TABLET | Freq: Every day | ORAL | Status: DC
  Administered 2022-05-23: 25 mg via ORAL
  Filled 2022-05-21 (×3): qty 1

## 2022-05-21 MED ORDER — METHOCARBAMOL 1000 MG/10ML IJ SOLN
500.0000 mg | Freq: Four times a day (QID) | INTRAVENOUS | Status: DC | PRN
  Administered 2022-05-21: 500 mg via INTRAVENOUS
  Filled 2022-05-21: qty 500

## 2022-05-21 MED ORDER — ONDANSETRON 4 MG PO TBDP: 4.0000 mg | ORAL_TABLET | Freq: Four times a day (QID) | ORAL | Status: DC | PRN

## 2022-05-21 MED ORDER — TRAZODONE HCL 100 MG PO TABS
100.0000 mg | ORAL_TABLET | Freq: Every day | ORAL | Status: DC
  Administered 2022-05-22: 100 mg via ORAL
  Filled 2022-05-21: qty 1

## 2022-05-21 MED ORDER — HYDROMORPHONE HCL 1 MG/ML IJ SOLN
1.0000 mg | INTRAMUSCULAR | Status: DC | PRN
  Administered 2022-05-21 (×2): 1 mg via INTRAVENOUS
  Filled 2022-05-21 (×2): qty 1

## 2022-05-21 MED ORDER — LACTATED RINGERS IV SOLN: INTRAVENOUS | Status: DC

## 2022-05-21 NOTE — ED Notes (Signed)
ED TO INPATIENT HANDOFF REPORT  ED Nurse Name and Phone #: Sherryll Burger [phone]  S Name/Age/Gender Jesse [name] 72 y.o. male Room/Bed: 036C/036C  Code Status   Code Status: Prior  Home/SNF/Other Home Patient oriented to: self, place, time, and situation Is this baseline? Yes   Triage Complete: Triage complete  Chief Complaint SBO (small bowel obstruction) [K56.609]  Triage Note Pt comes via [facility] for abd pain that started around 10pm, hx of hernia and repair with colostomy, hernia is now rigid and warm and painful.Pt received 4mg  of Zofran and 150 mcg of fentanyl PTA    Allergies Allergies  Allergen Reactions   Tape Rash    If left on for a long time    Level of Care/Admitting Diagnosis ED Disposition     ED Disposition  Admit   Condition  --   [facility]: [facility] [[id number]]  Level of Care: Med-Surg [[id number]]  May admit patient to [facility] or [facility] if equivalent level of care is available:: No  Covid Evaluation: Asymptomatic - no recent exposure (last 10 days) testing not required  Diagnosis: SBO (small bowel obstruction) [id number]  Admitting Physician: Karmen Bongo [[id number]]  Attending Physician: Karmen Bongo [id number]  Certification:: I certify this patient will need inpatient services for at least 2 midnights  Estimated Length of Stay: 3          B Medical/Surgery History Past Medical History:  Diagnosis Date   Alcohol use disorder in remission    Anxiety and depression    At high risk for falls    Former cigarette smoker    Generalized anxiety disorder    Hard of hearing    Hyperlipidemia due to type 2 diabetes mellitus    Hypertension    Morbid obesity    Neuropathy    O2 dependent    Risk for falls    Sleep apnea    Type 2 diabetes mellitus    Past Surgical History:  Procedure Laterality Date   APPENDECTOMY  1980   IR THORACENTESIS ASP PLEURAL SPACE W/IMG GUIDE  05/18/2021   IR THORACENTESIS ASP  PLEURAL SPACE W/IMG GUIDE  10/06/2021   KNEE SURGERY Right    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   Procedure: PACEMAKER IMPLANT;  Surgeon: Deboraha Sprang, MD;  Location: [facility];  Service: Cardiovascular;  Laterality: N/A;     A IV Location/Drains/Wounds Patient Lines/Drains/Airways Status     Active Line/Drains/Airways     Name Placement date Placement time Site Days   Peripheral IV 05/21/22 20 G Left Arm 05/21/22  0333  Arm  less than 1   Colostomy RLQ 05/13/21  1600  RLQ  373            Intake/Output Last 24 hours No intake or output data in the 24 hours ending 05/21/22 0742  Labs/Imaging Results for orders placed or performed during the hospital encounter of 05/21/22 (from the past 48 hour(s))  Comprehensive metabolic panel     Status: Abnormal   Collection Time: 05/21/22  3:56 AM  Result Value Ref Range   Sodium 133 (L) 135 - 145 mmol/L   Potassium 4.1 3.5 - 5.1 mmol/L   Chloride 102 98 - 111 mmol/L   CO2 22 22 - 32 mmol/L   Glucose, Bld 263 (H) 70 - 99 mg/dL    Comment: Glucose reference range applies only to samples taken after fasting for at least 8 hours.  BUN 26 (H) 8 - 23 mg/dL   Creatinine, Ser 1.70 (H) 0.61 - 1.24 mg/dL   Calcium 8.5 (L) 8.9 - 10.3 mg/dL   Total Protein 6.5 6.5 - 8.1 g/dL   Albumin 3.2 (L) 3.5 - 5.0 g/dL   AST 14 (L) 15 - 41 U/L   ALT 14 0 - 44 U/L   Alkaline Phosphatase 80 38 - 126 U/L   Total Bilirubin 0.4 0.3 - 1.2 mg/dL   GFR, Estimated 42 (L) >60 mL/min    Comment: (NOTE) Calculated using the CKD-EPI Creatinine Equation (2021)    Anion gap 9 5 - 15    Comment: Performed at [facility], [place] [address] [place], [place] [address]  Lipase, blood     Status: Abnormal   Collection Time: 05/21/22  3:56 AM  Result Value Ref Range   Lipase 60 (H) 11 - 51 U/L    Comment: Performed at [facility], [place] [address] [place], [place] [address]  CBC with Diff     Status: Abnormal   Collection Time: 05/21/22  3:56 AM   Result Value Ref Range   WBC 11.0 (H) 4.0 - 10.5 K/uL   RBC 3.90 (L) 4.22 - 5.81 MIL/uL   Hemoglobin 11.0 (L) 13.0 - 17.0 g/dL   HCT 36.4 (L) 39.0 - 52.0 %   MCV 93.3 80.0 - 100.0 fL   MCH 28.2 26.0 - 34.0 pg   MCHC 30.2 30.0 - 36.0 g/dL   RDW 13.8 11.5 - 15.5 %   Platelets 209 150 - 400 K/uL   nRBC 0.0 0.0 - 0.2 %   Neutrophils Relative % 80 %   Neutro Abs 8.9 (H) 1.7 - 7.7 K/uL   Lymphocytes Relative 6 %   Lymphs Abs 0.7 0.7 - 4.0 K/uL   Monocytes Relative 10 %   Monocytes Absolute 1.1 (H) 0.1 - 1.0 K/uL   Eosinophils Relative 2 %   Eosinophils Absolute 0.2 0.0 - 0.5 K/uL   Basophils Relative 1 %   Basophils Absolute 0.1 0.0 - 0.1 K/uL   Immature Granulocytes 1 %   Abs Immature Granulocytes 0.05 0.00 - 0.07 K/uL    Comment: Performed at [facility], [place] [address], [place], [place] [address]   CT ABDOMEN PELVIS WO CONTRAST  Result Date: 05/21/2022 CLINICAL DATA:  72 year old male with suspected bowel obstruction. EXAM: CT ABDOMEN AND PELVIS WITHOUT CONTRAST TECHNIQUE: Multidetector CT imaging of the abdomen and pelvis was performed following the standard protocol without IV contrast. RADIATION DOSE REDUCTION: This exam was performed according to the departmental dose-optimization program which includes automated exposure control, adjustment of the mA and/or kV according to patient size and/or use of iterative reconstruction technique. COMPARISON:  CT of the abdomen and pelvis 02/09/2022. FINDINGS: Lower chest: Small chronic left pleural effusion with apparent pleural thickening (poorly evaluated on today's noncontrast CT examination), and mass-like areas in the inferior aspect of the left lower lobe and inferior segment of the lingula, similar to prior examination 02/09/2022, presumably reflective of chronic rounded atelectasis (although underlying malignancy is difficult to entirely exclude) pacemaker leads in the right atrium and right ventricle. Hepatobiliary: No definite  suspicious cystic or solid hepatic lesions are confidently identified on today's noncontrast CT examination. Unenhanced appearance of the gallbladder is unremarkable. Pancreas: No definite pancreatic mass or peripancreatic fluid collections or inflammatory changes are noted on today's noncontrast CT examination. Spleen: Unremarkable. Adrenals/Urinary Tract: Mild bilateral renal atrophy. Contour abnormality in  the lower pole of the left kidney again noted where there is a slightly exophytic lesion estimated to measure approximately 3.6 cm in diameter (axial image 50 of series 4) which is slightly increased in size compared to prior study 02/09/2022, concerning for potential renal neoplasm. Mild bilateral renal atrophy. No hydroureteronephrosis. Urinary bladder is unremarkable in appearance. Bilateral adrenal glands are unremarkable in appearance. Stomach/Bowel: There is a large ventral hernia which is incompletely imaged. This contains portions of the stomach, small bowel and colon. Multiple dilated loops of small bowel are noted within the hernia measuring up to 4 cm in diameter, several of which have small air-fluid levels within them. Other loops of small bowel within the hernia. Essentially completely decompressed as they exit, apparently loops of distal small bowel. There also appears to be a diverting right upper quadrant colostomy. Postoperative changes of partial colonic resection are noted with a suture line in the sigmoid colon. The appendix is not confidently identified and may be surgically absent. Regardless, there are no inflammatory changes noted adjacent to the cecum to suggest the presence of an acute appendicitis at this time. Vascular/Lymphatic: Atherosclerosis in the abdominal aorta and pelvic vasculature. No definite lymphadenopathy noted in the abdomen or pelvis. Reproductive: Prostate gland and seminal vesicles are unremarkable in appearance. Other: No significant volume of ascites.  No  pneumoperitoneum. Musculoskeletal: There are no aggressive appearing lytic or blastic lesions noted in the visualized portions of the skeleton. IMPRESSION: 1. Massive ventral hernia containing portions of the stomach, small bowel and colon with multiple dilated loops of small bowel within the hernia which measure up to 4 cm in diameter concerning for small bowel obstruction. Distal small bowel is completely decompressed. Surgical consultation is recommended. 2. Enlarging mass-like area in the lower pole of the left kidney currently measuring 3.6 cm in diameter, poorly evaluated on today's noncontrast CT examination, but concerning for potential neoplasm. 3. Small chronic left pleural effusion with mass-like areas in the base of the left lung very similar to prior CT examination from 02/09/2022, favored to represent areas of chronic rounded atelectasis, with neoplasm not excluded, but not favored at this time. 4. Additional postoperative changes and incidental findings, similar to prior studies, as above. Electronically Signed   By: Vinnie Langton M.D.   On: 05/21/2022 06:12    Pending Labs Unresulted Labs (From admission, onward)    None       Vitals/Pain Today's Vitals   05/21/22 0334 05/21/22 0338 05/21/22 0551 05/21/22 0645  BP:  (!) 124/50    Pulse:  70  70  Resp:  18  18  Temp:  98.3 F (36.8 C)    TempSrc:  Oral    SpO2:  97%  97%  PainSc: 4   0-No pain     Isolation Precautions No active isolations  Medications Medications  HYDROmorphone (DILAUDID) injection 1 mg (1 mg Intravenous Given 05/21/22 0723)  iohexol (OMNIPAQUE) 9 MG/ML oral solution 500 mL ( Oral Canceled Entry 05/21/22 0411)  ondansetron (ZOFRAN) injection 4 mg (4 mg Intravenous Given 05/21/22 [id number])    Mobility walks with device     Focused Assessments    R Recommendations: See Admitting Provider Note  Report given to:   Additional Notes:

## 2022-05-21 NOTE — H&P (Signed)
History and Physical    Patient: Jesse [name] [id number] DOB: Oct 15, 1950 DOA: 05/21/2022 DOS: the patient was seen and examined on 05/21/2022 PCP: [name], NP  Patient coming from: Home - lives alone; NOK: Daughter, Davy Pique, [phone]   Chief Complaint: Abdominal pain  HPI: BRECK GOURDIN is a 72 y.o. male with medical history significant of multiple bowel surgeries s/p colectomy and end ileostomy, DM, morbid obesity, ambulatory dysfunction, pacemaker placement, chronic diastolic dysfunction, stage 4 CKD (baseline creatinine 2.2-2.5), and HTN presenting with abdominal pain.   He developed acute onset of abdominal pain about midnight, identical to presentation in December so he knew it was a blockage. +n/v.  Last time, he had an NG tube with slow resolution.    I spoke with his daughter - what seemed to help last time was pushing contrast with imaging.      ER Course:  SBO.  Hernias from prior surgeries, admitted in December for the same.  Surgery consulted.  Placing NG tube.       Review of Systems: As mentioned in the history of present illness. All other systems reviewed and are negative. Past Medical History:  Diagnosis Date   Alcohol use disorder in remission 1999   Anxiety and depression    At high risk for falls    Former cigarette smoker 1999   Generalized anxiety disorder    Hard of hearing    Hyperlipidemia due to type 2 diabetes mellitus    Hypertension    Morbid obesity    Neuropathy    O2 dependent    Risk for falls    Sleep apnea    Type 2 diabetes mellitus    Past Surgical History:  Procedure Laterality Date   APPENDECTOMY  1980   IR THORACENTESIS ASP PLEURAL SPACE W/IMG GUIDE  05/18/2021   IR THORACENTESIS ASP PLEURAL SPACE W/IMG GUIDE  10/06/2021   KNEE SURGERY Right    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   Procedure: PACEMAKER IMPLANT;  Surgeon: Deboraha Sprang, MD;  Location: [facility];  Service: Cardiovascular;  Laterality:  N/A;   Social History:  reports that he quit smoking about 25 years ago. His smoking use included cigarettes. He smoked an average of 1 pack per day. He has never used smokeless tobacco. He reports that he does not currently use alcohol. He reports that he does not use drugs.  Allergies  Allergen Reactions   Tape Rash    If left on for a long time    Family History  Problem Relation Age of Onset   Diabetes Brother    Bipolar disorder Daughter    Anxiety disorder Son     Prior to Admission medications   Medication Sig Start Date End Date Taking? Authorizing Provider  Accu-Chek Softclix Lancets lancets USE TO TEST BLOOD GLUCOSE ONCE DAILY AS DIRECTED 05/04/22   Ngetich, [name] C, NP  acetaminophen (TYLENOL) 325 MG tablet Take 2 tablets (650 mg total) by mouth every 6 (six) hours as needed for mild pain or moderate pain. 02/13/22   Thurnell Lose, MD  alfuzosin (UROXATRAL) 10 MG 24 hr tablet TAKE 1 TABLET BY MOUTH EVERY DAY 04/18/22   Ngetich, Nelda Bucks, NP  ALPRAZolam (XANAX) 0.25 MG tablet Take 2 tablets (0.5 mg total) by mouth 2 (two) times daily as needed for anxiety or sleep. 12/22/21   Ngetich, [name] C, NP  calcium carbonate (TUMS - DOSED IN MG ELEMENTAL CALCIUM) 500 MG chewable tablet  Chew 1 tablet by mouth 2 (two) times daily.    [provider]  Cholecalciferol (VITAMIN D3) 25 MCG (1000 UT) capsule Take 1 capsule (1,000 Units total) by mouth daily. 04/05/22   Ngetich, [name] C, NP  clopidogrel (PLAVIX) 75 MG tablet TAKE 1 TABLET(75 MG) BY MOUTH DAILY 03/19/22   Ngetich, [name] C, NP  cyanocobalamin 1000 MCG tablet Take 1,000 mcg by mouth daily.    [provider]  cyclobenzaprine (FLEXERIL) 10 MG tablet Take 1 tablet (10 mg total) by mouth 3 (three) times daily as needed for muscle spasms. 02/13/22   Thurnell Lose, MD  diclofenac Sodium (VOLTAREN) 1 % GEL Apply 2 g topically 4 (four) times daily. Apply to your right neck and shoulder area 3-4 times a day as needed  02/13/22   Thurnell Lose, MD  DULoxetine (CYMBALTA) 60 MG capsule TAKE 1 CAPSULE(60 MG) BY MOUTH DAILY 02/26/22   Ngetich, [name] C, NP  empagliflozin (JARDIANCE) 25 MG TABS tablet Take 25 mg by mouth daily.    [provider]  FEROSUL 325 (65 Fe) MG tablet TAKE 1 TABLET(325 MG) BY MOUTH DAILY WITH BREAKFAST 01/31/22   Ngetich, [name] C, NP  furosemide (LASIX) 40 MG tablet Take 2 tablets (80 mg total) by mouth in the morning and at bedtime. 02/15/22   Thurnell Lose, MD  glucose blood (ACCU-CHEK GUIDE) test strip USE TO TEST BLOOD SUGAR ONCE DAILY 04/06/22   Ngetich, [name] C, NP  insulin glargine (LANTUS SOLOSTAR) 100 UNIT/ML Solostar Pen Inject 50 Units into the skin at bedtime. 03/30/22   Ngetich, [name] C, NP  Insulin Pen Needle 32G X 4 MM MISC Use to inject insulin twice daily. Dx: E11.22 09/11/21   Ngetich, Nelda Bucks, NP  isosorbide mononitrate (IMDUR) 60 MG 24 hr tablet Take 1 tablet (60 mg total) by mouth daily. 01/25/22   Ngetich, [name] C, NP  ketoconazole (NIZORAL) 2 % cream Apply 1 application topically as needed for irritation. 03/16/21   Ngetich, Nelda Bucks, NP  Lancet Device MISC 1 Device by Does not apply route daily. 06/16/21   Ngetich, [name] C, NP  loperamide (IMODIUM A-D) 2 MG capsule Take 1 capsule (2 mg total) by mouth 4 (four) times daily as needed for diarrhea or loose stools. 02/13/22   Thurnell Lose, MD  NYSTATIN powder Apply 1 application  topically daily as needed (Yeast infection to afffected area). 05/03/21   [provider]  ondansetron (ZOFRAN) 4 MG tablet Take 1 tablet (4 mg total) by mouth every 8 (eight) hours as needed for nausea or vomiting. 02/13/22   Thurnell Lose, MD  OVER THE COUNTER MEDICATION Apply 1 Application topically every other day. Head and shoulder medicated shampoo alternate with selsun blue medicated    [provider]  OXYGEN Inhale 2 L into the lungs continuous.    [provider]  pantoprazole (PROTONIX) 40 MG  tablet TAKE 1 TABLET(40 MG) BY MOUTH TWICE DAILY BEFORE A MEAL 04/23/22   Ngetich, [name] C, NP  potassium chloride SA (KLOR-CON M) 20 MEQ tablet Take 1 tablet (20 mEq total) by mouth 2 (two) times daily. 02/15/22   Thurnell Lose, MD  Red Yeast Rice 600 MG CAPS Take 1,200 mg by mouth daily.    [provider]  rOPINIRole (REQUIP) 2 MG tablet TAKE 1 TABLET(2 MG) BY MOUTH AT BEDTIME 02/20/22   Ngetich, [name] C, NP  rosuvastatin (CRESTOR) 10 MG tablet TAKE 1 TABLET BY MOUTH  EVERY DAY 02/26/22   Ngetich, Nelda Bucks, NP  simethicone (MYLICON) 80 MG chewable tablet Chew 80 mg by mouth every 6 (six) hours as needed for flatulence.    [provider]  tiZANidine (ZANAFLEX) 2 MG tablet TAKE 1 TABLET BY MOUTH TWICE DAILY 05/04/22   Ngetich, [name] C, NP  topiramate (TOPAMAX) 25 MG tablet TAKE 1 TABLET BY MOUTH EVERY DAY 04/23/22   Ngetich, [name] C, NP  traMADol (ULTRAM) 50 MG tablet TAKE 1 TABLET(50 MG) BY MOUTH TWICE DAILY AS NEEDED 05/04/22   Ngetich, [name] C, NP  traZODone (DESYREL) 50 MG tablet TAKE 2 TABLETS(100 MG) BY MOUTH AT BEDTIME 04/06/22   Ngetich, [name] C, NP  triamcinolone cream (KENALOG) 0.1 % Apply 1 application. topically 2 (two) times daily. Apply to affected areas on shin area 06/30/21   Ngetich, [name] C, NP  TURMERIC PO Take 1,000 mg by mouth daily.    [provider]  VICTOZA 18 MG/3ML SOPN ADMINISTER 1.8 MG UNDER THE SKIN EVERY DAY 06/28/21   Ngetich, [name] C, NP    Physical Exam: Vitals:   05/21/22 0338 05/21/22 0645 05/21/22 0815 05/21/22 0911  BP: (!) 124/50  (!) 129/51 (!) 158/69  Pulse: 70 70 86 70  Resp: 18 18 17 17   Temp: 98.3 F (36.8 C)  98.5 F (36.9 C) 97.6 F (36.4 C)  TempSrc: Oral  Oral Oral  SpO2: 97% 97% 96% 97%   General:  Appears calm and comfortable and is in NAD, nauseated Eyes:  EOMI, normal lids, iris ENT:  grossly normal hearing, lips & tongue, mmm Neck:  no LAD, masses or thyromegaly Cardiovascular:  RRR, no m/r/g. 1-2+ LE edema.   Respiratory:   CTA bilaterally with no wheezes/rales/rhonchi.  Normal respiratory effort. Abdomen:  morbidly obese, colostomy in RLQ, midline well-healed surgical scar, multiple hernias with LLQ hernia feeling taut Skin:  no rash or induration seen on limited exam Musculoskeletal:  grossly normal tone BUE/BLE, good ROM, no bony abnormality Psychiatric:  blunted mood and affect, speech fluent and appropriate, AOx3 Neurologic:  CN 2-12 grossly intact, moves all extremities in coordinated fashion   Radiological Exams on Admission: Independently reviewed - see discussion in A/P where applicable  CT ABDOMEN PELVIS WO CONTRAST  Result Date: 05/21/2022 CLINICAL DATA:  73 year old male with suspected bowel obstruction. EXAM: CT ABDOMEN AND PELVIS WITHOUT CONTRAST TECHNIQUE: Multidetector CT imaging of the abdomen and pelvis was performed following the standard protocol without IV contrast. RADIATION DOSE REDUCTION: This exam was performed according to the departmental dose-optimization program which includes automated exposure control, adjustment of the mA and/or kV according to patient size and/or use of iterative reconstruction technique. COMPARISON:  CT of the abdomen and pelvis 02/09/2022. FINDINGS: Lower chest: Small chronic left pleural effusion with apparent pleural thickening (poorly evaluated on today's noncontrast CT examination), and mass-like areas in the inferior aspect of the left lower lobe and inferior segment of the lingula, similar to prior examination 02/09/2022, presumably reflective of chronic rounded atelectasis (although underlying malignancy is difficult to entirely exclude) pacemaker leads in the right atrium and right ventricle. Hepatobiliary: No definite suspicious cystic or solid hepatic lesions are confidently identified on today's noncontrast CT examination. Unenhanced appearance of the gallbladder is unremarkable. Pancreas: No definite pancreatic mass or peripancreatic fluid  collections or inflammatory changes are noted on today's noncontrast CT examination. Spleen: Unremarkable. Adrenals/Urinary Tract: Mild bilateral renal atrophy. Contour abnormality in the lower pole of the left kidney again noted where there is a  slightly exophytic lesion estimated to measure approximately 3.6 cm in diameter (axial image 50 of series 4) which is slightly increased in size compared to prior study 02/09/2022, concerning for potential renal neoplasm. Mild bilateral renal atrophy. No hydroureteronephrosis. Urinary bladder is unremarkable in appearance. Bilateral adrenal glands are unremarkable in appearance. Stomach/Bowel: There is a large ventral hernia which is incompletely imaged. This contains portions of the stomach, small bowel and colon. Multiple dilated loops of small bowel are noted within the hernia measuring up to 4 cm in diameter, several of which have small air-fluid levels within them. Other loops of small bowel within the hernia. Essentially completely decompressed as they exit, apparently loops of distal small bowel. There also appears to be a diverting right upper quadrant colostomy. Postoperative changes of partial colonic resection are noted with a suture line in the sigmoid colon. The appendix is not confidently identified and may be surgically absent. Regardless, there are no inflammatory changes noted adjacent to the cecum to suggest the presence of an acute appendicitis at this time. Vascular/Lymphatic: Atherosclerosis in the abdominal aorta and pelvic vasculature. No definite lymphadenopathy noted in the abdomen or pelvis. Reproductive: Prostate gland and seminal vesicles are unremarkable in appearance. Other: No significant volume of ascites.  No pneumoperitoneum. Musculoskeletal: There are no aggressive appearing lytic or blastic lesions noted in the visualized portions of the skeleton. IMPRESSION: 1. Massive ventral hernia containing portions of the stomach, small bowel and  colon with multiple dilated loops of small bowel within the hernia which measure up to 4 cm in diameter concerning for small bowel obstruction. Distal small bowel is completely decompressed. Surgical consultation is recommended. 2. Enlarging mass-like area in the lower pole of the left kidney currently measuring 3.6 cm in diameter, poorly evaluated on today's noncontrast CT examination, but concerning for potential neoplasm. 3. Small chronic left pleural effusion with mass-like areas in the base of the left lung very similar to prior CT examination from 02/09/2022, favored to represent areas of chronic rounded atelectasis, with neoplasm not excluded, but not favored at this time. 4. Additional postoperative changes and incidental findings, similar to prior studies, as above. Electronically Signed   By: Vinnie Langton M.D.   On: 05/21/2022 06:12    EKG: none   Labs on Admission: I have personally reviewed the available labs and imaging studies at the time of the admission.  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    Type 2 diabetes mellitus with hyperlipidemia   Hyperlipidemia   Colostomy care   Renal mass   Obesity hypoventilation syndrome    SBO, multiple hernias -Patient with prior h/o abdominal surgeries and known persistent large ventral hernias as well as recent (01/2022) admission for SBO presenting with acute onset of abdominal pain with n/v, abdominal distention, and CT findings c/w SBO -Will admit to Med Surg -Gen Surg consulted;  currently no indication for surgical intervention but he appears to be at high risk -NPO for bowel rest -NG tube ordered -IVF hydration -Pain control  with morphine -Transition Protonix from PO BID to daily IV  Renal mass -Enlarging mass in lower pole of L kidney -Concerning for potential neoplasm -Needs close outpatient f/u  DM -Recent A1c was 8, suboptimal control -hold Jardiance, Victoza -Continue Lantus -Cover with resistant-scale SSI   HTN -Continue Imdur -Previously on losartan but he does not appear to be taking this currently  Morbid obesity -Weight loss should be encouraged -Outpatient PCP/bariatric medicine f/u encouraged   Prior TIA/CVA -Hold Plavix for now  Stage 4 CKD -Baseline creatinine 2.2-2.5 -Appears stable/better than baseline -Attempt to avoid nephrotoxic medications -Recheck BMP in AM   Ambulatory dysfunction -Chronic wheelchair dependence -Continue Requip -Hold Tizanidine, change to IV Robaxin prn  OHS -On 2L home O2 -CT today with probable round atelectasis in bases -Appears to be stable at this time  Pacemaker -h/o sick sinus  Depression -Prior ?SI during admission in December -On alprazolam at home; will change to IV Ativan for now -Continue duloxetine, trazodone  Chronic diastolic dysfunction -Last echo was in 10/2021 with grade 1 diastolic dysfunction and preserved EF -Appears compensated -Hold lasix  OSA -Uses CPAP at home -Unable to use CPAP currently  BPH -Continue alfuzosin   HLD -Continue rosuvastatin     Advance Care Planning:   Code Status: Full Code Code status was discussed with the patient and/or family at the time of admission.  The patient would want to receive full resuscitative measures at this time. He is clear that he has a living will and would only prefer short-term life support, if needed.  Consults: Surgery  DVT Prophylaxis: SCDs  Family Communication: None present; I discussed issues with his  daughter by telephone after admission  Severity of Illness: The appropriate patient status for this patient is INPATIENT. Inpatient status is judged to be reasonable and necessary in order to provide the required intensity of service to ensure the patient's safety. The patient's presenting symptoms, physical exam findings, and initial radiographic and laboratory data in the context of their chronic comorbidities is felt to place them at high risk for further clinical deterioration. Furthermore, it is not anticipated that the patient will be medically stable for discharge from the hospital within 2 midnights of admission.   * I certify that at the point of admission it is my clinical judgment that the patient will require inpatient hospital care spanning beyond 2 midnights from the point of admission due to high intensity of service, high risk for further deterioration and high frequency of surveillance required.*  Author: Karmen Bongo, MD 05/21/2022 10:26 AM  For on call review [email or web address].

## 2022-05-21 NOTE — ED Triage Notes (Signed)
Pt comes via [facility] for abd pain that started around 10pm, hx of hernia and repair with colostomy, hernia is now rigid and warm and painful.Pt received 4mg  of Zofran and 150 mcg of fentanyl PTA

## 2022-05-21 NOTE — Social Work (Signed)
  Transition of Care [facility]) Screening Note   Patient Details  Name: GRANVILL HASELTON Date of Birth: May 30, 1950   [facility] CM/SW Contact:    Coralee Pesa, [place] Phone Number: 05/21/2022, 1:40 PM    Transition of Care Department [facility]) has reviewed patient and no TOC needs have been identified at this time. Noting pt documented to be from [place] at [facility]. We will continue to monitor patient advancement through interdisciplinary progression rounds. If new patient transition needs arise, please place a TOC consult.

## 2022-05-21 NOTE — ED Provider Notes (Signed)
[facility] Provider Note  CSN: [id number] Arrival date & time: 05/21/22 [id number]  Chief Complaint(s) Abdominal Pain  HPI Jesse [name] is a 72 y.o. male with a past medical history listed below including hypertension, hyperlipidemia, type 2 diabetes, prior colectomy for diverticulitis status post ostomy bag in the right lower quadrant, prior SBO's related to left lower abdomen hernia who presents to the emergency department with sharp pain in the left lower quadrant that began around 10 PM last night.  Patient reports that the abdomen is chronically distended but usually soft.  His hernia in the left lower quadrant is now firm and tight.  He endorses nausea without emesis.  Still having good output from his ostomy bag.  States that this was similar to his recent admission in December for SBO  The history is provided by the patient.    Past Medical History Past Medical History:  Diagnosis Date   Alcohol use disorder in remission    Anxiety and depression    At high risk for falls    Former cigarette smoker    Generalized anxiety disorder    Hard of hearing    Hyperlipidemia due to type 2 diabetes mellitus    Hypertension    Morbid obesity    Neuropathy    O2 dependent    Risk for falls    Sleep apnea    Type 2 diabetes mellitus    Patient Active Problem List   Diagnosis Date Noted   Bleeding from colostomy stoma 04/16/2022   Colostomy complication 0000000   Wheelchair dependent 02/09/2022   SBO (small bowel obstruction) 02/09/2022   CKD (chronic kidney disease) stage 4, GFR 15-29 ml/min (HCC) - baseline SCr 2.2-2.5 02/09/2022   right facial numbness 11/01/2021   Essential hypertension 11/01/2021   Hyperlipidemia 11/01/2021   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/01/2021   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  09/15/2021   Bradycardia 09/01/2021   Benign hypertension with CKD (chronic kidney disease) stage III 06/18/2021    Chronic systolic congestive heart failure 06/18/2021   Generalized anxiety disorder 06/18/2021   Type 2 diabetes mellitus with hyperlipidemia 05/14/2021   Chronic anemia 05/14/2021   OSA (obstructive sleep apnea) 05/13/2021   Chronic respiratory failure with hypoxia 05/13/2021   Obesity, Class III, BMI 40-49.9 (morbid obesity) 05/13/2021   Chronic pain of both knees 12/08/2020   Anxiety 12/08/2020   Type 2 diabetes mellitus    Former cigarette smoker    Alcohol use disorder in remission    Hard of hearing    Abscess 04/01/2015   Anemia 03/07/2015   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 Authorizing Provider  Accu-Chek Softclix Lancets lancets USE TO TEST BLOOD GLUCOSE ONCE DAILY AS DIRECTED 05/04/22   Ngetich, [name] C, NP  acetaminophen (TYLENOL) 325 MG tablet Take 2 tablets (650 mg total) by mouth every 6 (six) hours as needed for mild pain or moderate pain. 02/13/22   Thurnell Lose, MD  alfuzosin (UROXATRAL) 10 MG 24 hr tablet TAKE 1 TABLET BY MOUTH EVERY DAY 04/18/22   Ngetich, Nelda Bucks, NP  ALPRAZolam (XANAX) 0.25 MG tablet Take 2 tablets (0.5 mg total) by mouth 2 (two) times daily as needed for anxiety or sleep. 12/22/21   Ngetich, Nelda Bucks, NP  calcium carbonate (  TUMS - DOSED IN MG ELEMENTAL CALCIUM) 500 MG chewable tablet Chew 1 tablet by mouth 2 (two) times daily.    [provider]  Cholecalciferol (VITAMIN D3) 25 MCG (1000 UT) capsule Take 1 capsule (1,000 Units total) by mouth daily. 04/05/22   Ngetich, [name] C, NP  clopidogrel (PLAVIX) 75 MG tablet TAKE 1 TABLET(75 MG) BY MOUTH DAILY 03/19/22   Ngetich, [name] C, NP  cyanocobalamin 1000 MCG tablet Take 1,000 mcg by mouth daily.    [provider]  cyclobenzaprine (FLEXERIL) 10 MG tablet Take 1 tablet (10 mg total) by mouth 3 (three)  times daily as needed for muscle spasms. 02/13/22   Thurnell Lose, MD  diclofenac Sodium (VOLTAREN) 1 % GEL Apply 2 g topically 4 (four) times daily. Apply to your right neck and shoulder area 3-4 times a day as needed 02/13/22   Thurnell Lose, MD  DULoxetine (CYMBALTA) 60 MG capsule TAKE 1 CAPSULE(60 MG) BY MOUTH DAILY 02/26/22   Ngetich, [name] C, NP  empagliflozin (JARDIANCE) 25 MG TABS tablet Take 25 mg by mouth daily.    [provider]  FEROSUL 325 (65 Fe) MG tablet TAKE 1 TABLET(325 MG) BY MOUTH DAILY WITH BREAKFAST 01/31/22   Ngetich, [name] C, NP  furosemide (LASIX) 40 MG tablet Take 2 tablets (80 mg total) by mouth in the morning and at bedtime. 02/15/22   Thurnell Lose, MD  glucose blood (ACCU-CHEK GUIDE) test strip USE TO TEST BLOOD SUGAR ONCE DAILY 04/06/22   Ngetich, [name] C, NP  insulin glargine (LANTUS SOLOSTAR) 100 UNIT/ML Solostar Pen Inject 50 Units into the skin at bedtime. 03/30/22   Ngetich, [name] C, NP  Insulin Pen Needle 32G X 4 MM MISC Use to inject insulin twice daily. Dx: E11.22 09/11/21   Ngetich, Nelda Bucks, NP  isosorbide mononitrate (IMDUR) 60 MG 24 hr tablet Take 1 tablet (60 mg total) by mouth daily. 01/25/22   Ngetich, [name] C, NP  ketoconazole (NIZORAL) 2 % cream Apply 1 application topically as needed for irritation. 03/16/21   Ngetich, Nelda Bucks, NP  Lancet Device MISC 1 Device by Does not apply route daily. 06/16/21   Ngetich, [name] C, NP  loperamide (IMODIUM A-D) 2 MG capsule Take 1 capsule (2 mg total) by mouth 4 (four) times daily as needed for diarrhea or loose stools. 02/13/22   Thurnell Lose, MD  NYSTATIN powder Apply 1 application  topically daily as needed (Yeast infection to afffected area). 05/03/21   [provider]  ondansetron (ZOFRAN) 4 MG tablet Take 1 tablet (4 mg total) by mouth every 8 (eight) hours as needed for nausea or vomiting. 02/13/22   Thurnell Lose, MD  OVER THE COUNTER MEDICATION Apply 1 Application topically every  other day. Head and shoulder medicated shampoo alternate with selsun blue medicated    [provider]  OXYGEN Inhale 2 L into the lungs continuous.    [provider]  pantoprazole (PROTONIX) 40 MG tablet TAKE 1 TABLET(40 MG) BY MOUTH TWICE DAILY BEFORE A MEAL 04/23/22   Ngetich, [name] C, NP  potassium chloride SA (KLOR-CON M) 20 MEQ tablet Take 1 tablet (20 mEq total) by mouth 2 (two) times daily. 02/15/22   Thurnell Lose, MD  Red Yeast Rice 600 MG CAPS Take 1,200 mg by mouth daily.    [provider]  rOPINIRole (REQUIP) 2 MG tablet TAKE 1 TABLET(2 MG) BY MOUTH AT BEDTIME 02/20/22   Ngetich, Nelda Bucks, NP  rosuvastatin (CRESTOR) 10 MG tablet TAKE 1 TABLET BY MOUTH EVERY DAY 02/26/22   Ngetich, [name] C, NP  simethicone (MYLICON) 80 MG chewable tablet Chew 80 mg by mouth every 6 (six) hours as needed for flatulence.    [provider]  tiZANidine (ZANAFLEX) 2 MG tablet TAKE 1 TABLET BY MOUTH TWICE DAILY 05/04/22   Ngetich, [name] C, NP  topiramate (TOPAMAX) 25 MG tablet TAKE 1 TABLET BY MOUTH EVERY DAY 04/23/22   Ngetich, [name] C, NP  traMADol (ULTRAM) 50 MG tablet TAKE 1 TABLET(50 MG) BY MOUTH TWICE DAILY AS NEEDED 05/04/22   Ngetich, [name] C, NP  traZODone (DESYREL) 50 MG tablet TAKE 2 TABLETS(100 MG) BY MOUTH AT BEDTIME 04/06/22   Ngetich, [name] C, NP  triamcinolone cream (KENALOG) 0.1 % Apply 1 application. topically 2 (two) times daily. Apply to affected areas on shin area 06/30/21   Ngetich, [name] C, NP  TURMERIC PO Take 1,000 mg by mouth daily.    [provider]  VICTOZA 18 MG/3ML SOPN ADMINISTER 1.8 MG UNDER THE SKIN EVERY DAY 06/28/21   Ngetich, [name] C, NP                                                                                                                                    Allergies Tape  Review of Systems Review of Systems As noted in HPI  Physical Exam Vital Signs  I have reviewed the triage vital signs BP (!) 124/50 (BP Location:  Right Arm)   Pulse 70   Temp 98.3 F (36.8 C) (Oral)   Resp 18   SpO2 97%   Physical Exam Vitals reviewed.  Constitutional:      General: He is not in acute distress.    Appearance: He is well-developed. He is not diaphoretic.  HENT:     Head: Normocephalic and atraumatic.     Right Ear: External ear normal.     Left Ear: External ear normal.     Nose: Nose normal.     Mouth/Throat:     Mouth: Mucous membranes are moist.  Eyes:     General: No scleral icterus.    Conjunctiva/sclera: Conjunctivae normal.  Neck:     Trachea: Phonation normal.  Cardiovascular:     Rate and Rhythm: Normal rate and regular rhythm.  Pulmonary:     Effort: Pulmonary effort is normal. No respiratory distress.     Breath sounds: No stridor.  Abdominal:     General: A surgical scar is present. There is distension.    Musculoskeletal:        General: Normal range of motion.     Cervical back: Normal range of motion.     Right lower leg: 1+ Pitting Edema present.     Left lower leg: 1+ Pitting Edema present.  Neurological:     Mental Status: He is alert and oriented to person, place, and time.  Psychiatric:        Behavior: Behavior normal.     ED Results and Treatments Labs (all labs ordered are listed, but only abnormal results are displayed) Labs Reviewed  COMPREHENSIVE METABOLIC PANEL - Abnormal; Notable for the following components:      Result Value   Sodium 133 (*)    Glucose, Bld 263 (*)    BUN 26 (*)    Creatinine, Ser 1.70 (*)    Calcium 8.5 (*)    Albumin 3.2 (*)    AST 14 (*)    GFR, Estimated 42 (*)    All other components within normal limits  LIPASE, BLOOD - Abnormal; Notable for the following components:   Lipase 60 (*)    All other components within normal limits  CBC WITH DIFFERENTIAL/PLATELET - Abnormal; Notable for the following components:   WBC 11.0 (*)    RBC 3.90 (*)    Hemoglobin 11.0 (*)    HCT 36.4 (*)    Neutro Abs 8.9 (*)    Monocytes Absolute 1.1  (*)    All other components within normal limits                                                                                                                         EKG  EKG Interpretation  Date/Time:    Ventricular Rate:    PR Interval:    QRS Duration:   QT Interval:    QTC Calculation:   R Axis:     Text Interpretation:         Radiology CT ABDOMEN PELVIS WO CONTRAST  Result Date: 05/21/2022 CLINICAL DATA:  72 year old male with suspected bowel obstruction. EXAM: CT ABDOMEN AND PELVIS WITHOUT CONTRAST TECHNIQUE: Multidetector CT imaging of the abdomen and pelvis was performed following the standard protocol without IV contrast. RADIATION DOSE REDUCTION: This exam was performed according to the departmental dose-optimization program which includes automated exposure control, adjustment of the mA and/or kV according to patient size and/or use of iterative reconstruction technique. COMPARISON:  CT of the abdomen and pelvis 02/09/2022. FINDINGS: Lower chest: Small chronic left pleural effusion with apparent pleural thickening (poorly evaluated on today's noncontrast CT examination), and mass-like areas in the inferior aspect of the left lower lobe and inferior segment of the lingula, similar to prior examination 02/09/2022, presumably reflective of chronic rounded atelectasis (although underlying malignancy is difficult to entirely exclude) pacemaker leads in the right atrium and right ventricle. Hepatobiliary: No definite suspicious cystic or solid hepatic lesions are confidently identified on today's noncontrast CT examination. Unenhanced appearance of the gallbladder is unremarkable. Pancreas: No definite pancreatic mass or peripancreatic fluid collections or inflammatory changes are noted on today's noncontrast CT examination. Spleen: Unremarkable. Adrenals/Urinary Tract: Mild bilateral renal atrophy. Contour abnormality in the lower pole of the left kidney again noted where there is a  slightly exophytic lesion estimated to measure approximately 3.6 cm in diameter (axial image 50 of series 4) which is slightly  increased in size compared to prior study 02/09/2022, concerning for potential renal neoplasm. Mild bilateral renal atrophy. No hydroureteronephrosis. Urinary bladder is unremarkable in appearance. Bilateral adrenal glands are unremarkable in appearance. Stomach/Bowel: There is a large ventral hernia which is incompletely imaged. This contains portions of the stomach, small bowel and colon. Multiple dilated loops of small bowel are noted within the hernia measuring up to 4 cm in diameter, several of which have small air-fluid levels within them. Other loops of small bowel within the hernia. Essentially completely decompressed as they exit, apparently loops of distal small bowel. There also appears to be a diverting right upper quadrant colostomy. Postoperative changes of partial colonic resection are noted with a suture line in the sigmoid colon. The appendix is not confidently identified and may be surgically absent. Regardless, there are no inflammatory changes noted adjacent to the cecum to suggest the presence of an acute appendicitis at this time. Vascular/Lymphatic: Atherosclerosis in the abdominal aorta and pelvic vasculature. No definite lymphadenopathy noted in the abdomen or pelvis. Reproductive: Prostate gland and seminal vesicles are unremarkable in appearance. Other: No significant volume of ascites.  No pneumoperitoneum. Musculoskeletal: There are no aggressive appearing lytic or blastic lesions noted in the visualized portions of the skeleton. IMPRESSION: 1. Massive ventral hernia containing portions of the stomach, small bowel and colon with multiple dilated loops of small bowel within the hernia which measure up to 4 cm in diameter concerning for small bowel obstruction. Distal small bowel is completely decompressed. Surgical consultation is recommended. 2. Enlarging  mass-like area in the lower pole of the left kidney currently measuring 3.6 cm in diameter, poorly evaluated on today's noncontrast CT examination, but concerning for potential neoplasm. 3. Small chronic left pleural effusion with mass-like areas in the base of the left lung very similar to prior CT examination from 02/09/2022, favored to represent areas of chronic rounded atelectasis, with neoplasm not excluded, but not favored at this time. 4. Additional postoperative changes and incidental findings, similar to prior studies, as above. Electronically Signed   By: Vinnie Langton M.D.   On: 05/21/2022 06:12    Medications Ordered in ED Medications  ondansetron (ZOFRAN) injection 4 mg (4 mg Intravenous Given 05/21/22 0411)  HYDROmorphone (DILAUDID) injection 1 mg (1 mg Intravenous Given 05/21/22 0411)  iohexol (OMNIPAQUE) 9 MG/ML oral solution 500 mL ( Oral Canceled Entry 05/21/22 0411)                                                                                                                                     Procedures Procedures  (including critical care time)  Medical Decision Making / ED Course  Click here for ABCD2, HEART and other calculators  Medical Decision Making Amount and/or Complexity of Data Reviewed Labs: ordered. Decision-making details documented in ED Course. Radiology: ordered and independent interpretation performed. Decision-making details documented in ED Course. ECG/medicine tests: ordered and independent interpretation  performed. Decision-making details documented in ED Course.  Risk Prescription drug management. Decision regarding hospitalization.    This patient presents to the ED for concern of LLQ pain, this involves an extensive number of treatment options, and is a complaint that carries with it a high risk of complications and morbidity. The differential diagnosis includes but not limited to SBO, incarcerated hernia.  Will assess for other intra-abdominal  inflammatory/infectious processes.  Initial intervention:  IV pain medicine IV Zofran  Work up: (Labs and/or imaging independently interpreted by me) CBC with mild leukocytosis and anemia. Metabolic panel with mild hyperglycemia without evidence of DKA.  Mild renal insufficiency without evidence of AKI.  No bili obstruction.  Mildly elevated lipase but not consistent with pancreatitis. CT scan is consistent with small bowel obstruction with multiple loops within the hernia in the left lower quadrant.  Reassessment: NGT placed. Consulted hospitalist service and spoke with Dr. Lorin Mercy who agreed to admit patient Consult to general surgery who will evaluate patient and follow along during admission.        Final Clinical Impression(s) / ED Diagnoses Final diagnoses:  SBO (small bowel obstruction)           This chart was dictated using voice recognition software.  Despite best efforts to proofread,  errors can occur which can change the documentation meaning.    Fatima Blank, MD 05/21/22 [phone]

## 2022-05-21 NOTE — Consult Note (Addendum)
Jesse [name] 12-Feb-1951  [id number].    Requesting MD: Dr. Addison Lank Chief Complaint/Reason for Consult: SBO  HPI: Jesse [name] is a 72 y.o. 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 Faythe Ghee in [facility], he was left open and went back for washout and closure the following day.  His symptoms started last night with pain around his  central and LLQ hernia. Pain progressively worsened and was associated with nausea, vomiting and decreased colostomy output. He is still having some ostomy output. This feels very similar to his sbo that he had in Dec 2023 that resolved with NGT and conservative management.   In the ED he was afebrile without tachycardia or systolic hypotension. WBC 11. CT scan with massive ventral hernia (vs loss of domain) containing portions of the stomach, small bowel and colon with multiple dilated loops of small bowel within the hernia which measure up to 4 cm in diameter concerning for small bowel obstruction. Distal small bowel is completely decompressed.   Patient was admitted to the medical service. General surgery asked to see. NGT placement has been ordered.    Lives at [facility] independent living  Programmer, multimedia wheelchair for mobility No alcohol use Quit smoking in 1999 Retired Agricultural consultant  ROS: ROS As above, see hpi  Family History  Problem Relation Age of Onset   Diabetes Brother    Bipolar disorder Daughter    Anxiety disorder Son     Past Medical History:  Diagnosis Date   Alcohol use disorder in  remission    Anxiety and depression    At high risk for falls    Former cigarette smoker    Generalized anxiety disorder    Hard of hearing    Hyperlipidemia due to type 2 diabetes mellitus    Hypertension    Morbid obesity    Neuropathy    O2 dependent    Risk for falls    Sleep apnea    Type 2 diabetes mellitus     Past Surgical History:  Procedure Laterality Date   APPENDECTOMY  1980   IR THORACENTESIS ASP PLEURAL SPACE W/IMG GUIDE  05/18/2021   IR THORACENTESIS ASP PLEURAL SPACE W/IMG GUIDE  10/06/2021   KNEE SURGERY Right    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   Procedure: PACEMAKER IMPLANT;  Surgeon: Deboraha Sprang, MD;  Location: [facility];  Service: Cardiovascular;  Laterality: N/A;    Social History:  reports that he quit smoking about 25 years ago. His smoking use included cigarettes. He smoked an average of 1 pack per day. He has never used smokeless tobacco. He reports that he does not currently use alcohol. He reports that he does not use drugs.  Allergies:  Allergies  Allergen Reactions   Tape Rash    If left on for a long time    (Not in a hospital admission)    Physical Exam: Blood pressure (!) 124/50, pulse 70, temperature 98.3 F (36.8 C), temperature source Oral, resp. rate 18, SpO2 97 %. General: chronically ill appearing male who is laying in  bed in NAD Heart: regular, rate, and rhythm Lungs: CTA b/l. Respiratory effort nonlabored on room air Abd: Obese, soft, large ventral hernia in the central/epigastric region of his abdomen that is soft and seems to slide freely but is not fully reducible/ no overlying skin changes. He does have some mild ttp over this. Left sided, more lower abdominal hernia that is firm but nontender and not reducible/ no overlying skin changes. No rigidity or guarding. Ostomy bag appliance prevents visualization of stoma - there is a small amount of liquid stool in bag.  MS: BLE edema present - L > R which he reports is  chronic  Skin: warm and dry  Psych: A&Ox4 with an appropriate affect  Results for orders placed or performed during the hospital encounter of 05/21/22 (from the past 48 hour(s))  Comprehensive metabolic panel     Status: Abnormal   Collection Time: 05/21/22  3:56 AM  Result Value Ref Range   Sodium 133 (L) 135 - 145 mmol/L   Potassium 4.1 3.5 - 5.1 mmol/L   Chloride 102 98 - 111 mmol/L   CO2 22 22 - 32 mmol/L   Glucose, Bld 263 (H) 70 - 99 mg/dL    Comment: Glucose reference range applies only to samples taken after fasting for at least 8 hours.   BUN 26 (H) 8 - 23 mg/dL   Creatinine, Ser 1.70 (H) 0.61 - 1.24 mg/dL   Calcium 8.5 (L) 8.9 - 10.3 mg/dL   Total Protein 6.5 6.5 - 8.1 g/dL   Albumin 3.2 (L) 3.5 - 5.0 g/dL   AST 14 (L) 15 - 41 U/L   ALT 14 0 - 44 U/L   Alkaline Phosphatase 80 38 - 126 U/L   Total Bilirubin 0.4 0.3 - 1.2 mg/dL   GFR, Estimated 42 (L) >60 mL/min    Comment: (NOTE) Calculated using the CKD-EPI Creatinine Equation (2021)    Anion gap 9 5 - 15    Comment: Performed at [facility], [place] [address] [place], [place] [address]  Lipase, blood     Status: Abnormal   Collection Time: 05/21/22  3:56 AM  Result Value Ref Range   Lipase 60 (H) 11 - 51 U/L    Comment: Performed at [facility], [place] [address] [place], [place] [address]  CBC with Diff     Status: Abnormal   Collection Time: 05/21/22  3:56 AM  Result Value Ref Range   WBC 11.0 (H) 4.0 - 10.5 K/uL   RBC 3.90 (L) 4.22 - 5.81 MIL/uL   Hemoglobin 11.0 (L) 13.0 - 17.0 g/dL   HCT 36.4 (L) 39.0 - 52.0 %   MCV 93.3 80.0 - 100.0 fL   MCH 28.2 26.0 - 34.0 pg   MCHC 30.2 30.0 - 36.0 g/dL   RDW 13.8 11.5 - 15.5 %   Platelets 209 150 - 400 K/uL   nRBC 0.0 0.0 - 0.2 %   Neutrophils Relative % 80 %   Neutro Abs 8.9 (H) 1.7 - 7.7 K/uL   Lymphocytes Relative 6 %   Lymphs Abs 0.7 0.7 - 4.0 K/uL   Monocytes Relative 10 %   Monocytes Absolute 1.1 (H) 0.1 - 1.0 K/uL   Eosinophils Relative 2 %    Eosinophils Absolute 0.2 0.0 - 0.5 K/uL   Basophils Relative 1 %   Basophils Absolute 0.1 0.0 - 0.1 K/uL   Immature Granulocytes 1 %   Abs Immature Granulocytes 0.05 0.00 -  0.07 K/uL    Comment: Performed at [facility], [place] [address] [place], [place] [address]   CT ABDOMEN PELVIS WO CONTRAST  Result Date: 05/21/2022 CLINICAL DATA:  72 year old male with suspected bowel obstruction. EXAM: CT ABDOMEN AND PELVIS WITHOUT CONTRAST TECHNIQUE: Multidetector CT imaging of the abdomen and pelvis was performed following the standard protocol without IV contrast. RADIATION DOSE REDUCTION: This exam was performed according to the departmental dose-optimization program which includes automated exposure control, adjustment of the mA and/or kV according to patient size and/or use of iterative reconstruction technique. COMPARISON:  CT of the abdomen and pelvis 02/09/2022. FINDINGS: Lower chest: Small chronic left pleural effusion with apparent pleural thickening (poorly evaluated on today's noncontrast CT examination), and mass-like areas in the inferior aspect of the left lower lobe and inferior segment of the lingula, similar to prior examination 02/09/2022, presumably reflective of chronic rounded atelectasis (although underlying malignancy is difficult to entirely exclude) pacemaker leads in the right atrium and right ventricle. Hepatobiliary: No definite suspicious cystic or solid hepatic lesions are confidently identified on today's noncontrast CT examination. Unenhanced appearance of the gallbladder is unremarkable. Pancreas: No definite pancreatic mass or peripancreatic fluid collections or inflammatory changes are noted on today's noncontrast CT examination. Spleen: Unremarkable. Adrenals/Urinary Tract: Mild bilateral renal atrophy. Contour abnormality in the lower pole of the left kidney again noted where there is a slightly exophytic lesion estimated to measure approximately 3.6 cm in diameter (axial  image 50 of series 4) which is slightly increased in size compared to prior study 02/09/2022, concerning for potential renal neoplasm. Mild bilateral renal atrophy. No hydroureteronephrosis. Urinary bladder is unremarkable in appearance. Bilateral adrenal glands are unremarkable in appearance. Stomach/Bowel: There is a large ventral hernia which is incompletely imaged. This contains portions of the stomach, small bowel and colon. Multiple dilated loops of small bowel are noted within the hernia measuring up to 4 cm in diameter, several of which have small air-fluid levels within them. Other loops of small bowel within the hernia. Essentially completely decompressed as they exit, apparently loops of distal small bowel. There also appears to be a diverting right upper quadrant colostomy. Postoperative changes of partial colonic resection are noted with a suture line in the sigmoid colon. The appendix is not confidently identified and may be surgically absent. Regardless, there are no inflammatory changes noted adjacent to the cecum to suggest the presence of an acute appendicitis at this time. Vascular/Lymphatic: Atherosclerosis in the abdominal aorta and pelvic vasculature. No definite lymphadenopathy noted in the abdomen or pelvis. Reproductive: Prostate gland and seminal vesicles are unremarkable in appearance. Other: No significant volume of ascites.  No pneumoperitoneum. Musculoskeletal: There are no aggressive appearing lytic or blastic lesions noted in the visualized portions of the skeleton. IMPRESSION: 1. Massive ventral hernia containing portions of the stomach, small bowel and colon with multiple dilated loops of small bowel within the hernia which measure up to 4 cm in diameter concerning for small bowel obstruction. Distal small bowel is completely decompressed. Surgical consultation is recommended. 2. Enlarging mass-like area in the lower pole of the left kidney currently measuring 3.6 cm in diameter,  poorly evaluated on today's noncontrast CT examination, but concerning for potential neoplasm. 3. Small chronic left pleural effusion with mass-like areas in the base of the left lung very similar to prior CT examination from 02/09/2022, favored to represent areas of chronic rounded atelectasis, with neoplasm not excluded, but not favored at this time. 4. Additional postoperative changes and  incidental findings, similar to prior studies, as above. Electronically Signed   By: Vinnie Langton M.D.   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 Faythe Ghee in [facility], he was left open and went back for washout and closure the following day - No indication for acute surgical intervention.  - Recommended NPO and NGT placement for decompression  - Start SBO protocol - Hopefully patient will improve with conservative management. If patient fails to improve with conservative management, they may require exploratory surgery during admission. Suspect patient would be high risk for surgery given MMP. Would ask TRH to optimize him incase he needs surgery during admission.  - Agree with medical admission. We will follow with you.   FEN - IVF, NPO/NGT to LIWS. 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  Jillyn Ledger, [facility] 05/21/2022, 8:11 AM Please see Amion for pager number during day hours 7:00am-4:30pm

## 2022-05-22 ENCOUNTER — Inpatient Hospital Stay (HOSPITAL_COMMUNITY): Payer: Medicare PPO

## 2022-05-22 DIAGNOSIS — K56609 Unspecified intestinal obstruction, unspecified as to partial versus complete obstruction: Secondary | ICD-10-CM | POA: Diagnosis not present

## 2022-05-22 LAB — BASIC METABOLIC PANEL
Anion gap: 11 (ref 5–15)
BUN: 31 mg/dL — ABNORMAL HIGH (ref 8–23)
CO2: 21 mmol/L — ABNORMAL LOW (ref 22–32)
Calcium: 9 mg/dL (ref 8.9–10.3)
Chloride: 109 mmol/L (ref 98–111)
Creatinine, Ser: 1.81 mg/dL — ABNORMAL HIGH (ref 0.61–1.24)
GFR, Estimated: 39 mL/min — ABNORMAL LOW (ref >60)
Glucose, Bld: 162 mg/dL — ABNORMAL HIGH (ref 70–99)
Potassium: 3.9 mmol/L (ref 3.5–5.1)
Sodium: 141 mmol/L (ref 135–145)

## 2022-05-22 LAB — GLUCOSE, CAPILLARY
Glucose-Capillary: 171 mg/dL — ABNORMAL HIGH (ref 70–99)
Glucose-Capillary: 124 mg/dL — ABNORMAL HIGH (ref 70–99)
Glucose-Capillary: 176 mg/dL — ABNORMAL HIGH (ref 70–99)
Glucose-Capillary: 197 mg/dL — ABNORMAL HIGH (ref 70–99)

## 2022-05-22 LAB — CBC
HCT: 41 % (ref 39.0–52.0)
Hemoglobin: 12.7 g/dL — ABNORMAL LOW (ref 13.0–17.0)
MCH: 28.2 pg (ref 26.0–34.0)
MCHC: 31 g/dL (ref 30.0–36.0)
MCV: 91.1 fL (ref 80.0–100.0)
Platelets: 248 10*3/uL (ref 150–400)
RBC: 4.5 MIL/uL (ref 4.22–5.81)
RDW: 14.1 % (ref 11.5–15.5)
WBC: 8.9 10*3/uL (ref 4.0–10.5)
nRBC: 0 % (ref 0.0–0.2)

## 2022-05-22 MED ORDER — IPRATROPIUM-ALBUTEROL 0.5-2.5 (3) MG/3ML IN SOLN
3.0000 mL | RESPIRATORY_TRACT | Status: DC | PRN
  Filled 2022-05-22: qty 3

## 2022-05-22 MED ORDER — TRAZODONE HCL 50 MG PO TABS: 50.0000 mg | ORAL_TABLET | Freq: Every evening | ORAL | Status: DC | PRN

## 2022-05-22 MED ORDER — SENNOSIDES-DOCUSATE SODIUM 8.6-50 MG PO TABS: 1.0000 | ORAL_TABLET | Freq: Every evening | ORAL | Status: DC | PRN

## 2022-05-22 MED ORDER — HYDRALAZINE HCL 20 MG/ML IJ SOLN: 10.0000 mg | INTRAMUSCULAR | Status: DC | PRN

## 2022-05-22 MED ORDER — METOPROLOL TARTRATE 5 MG/5ML IV SOLN: 5.0000 mg | INTRAVENOUS | Status: DC | PRN

## 2022-05-22 MED ORDER — GUAIFENESIN 100 MG/5ML PO LIQD: 5.0000 mL | ORAL | Status: DC | PRN

## 2022-05-22 NOTE — Consult Note (Signed)
WOC consulted for ostomy Patient has had stoma since 1972; revision of stoma and moved to the RLQ Patient has hernia and creasing in his abdomen. He is independent in the care of his ostomy and reported by the bedside nurse to have changed it today.  Last time WOC nursing team saw the patient he was getting 4 days wear time.   Uses 1pc convex pre-cut pouch  1 1/2" however we do not carry this inpatient.  He uses ostomy paste, ostomy powder, barrier strips and has had some issues with peristomal bleeding in the past but has managed them without any further issues.   I will provide the Lawson # for the most comparable supplies we have.   Lawson # [id number] 1pc convex drainable pouch Lawson # [id number] 2" barrier rings Lawson # 6 ostomy powder  We do not carry ostomy paste or barrier strips. Patient can sub barrier rings cut in half for strips.   Patient is independent in care; may need support with ostomy pouch change based on his current medical status.   Discussed POC with patient and bedside nurse.  Re consult if needed, will not follow at this time. Thanks  Raudel Bazen MSN, RN,CWOCN, CNS, CWON-AP ([phone])  

## 2022-05-22 NOTE — TOC Initial Note (Addendum)
Transition of Care (TOC) - Initial/Assessment Note   Spoke to patient at bedside. Advanced to clear liquids today.   Patient from independent living. Has home oxygen from [employer] 1 [id number] , walker, cane and wheelchair. Has walker in shower with seat.   Patient reports discharge home is tomorrow. PT recommending HHPT, patient requesting HHPT Rob with Jesse [name] , also Jesse [name] with [employer] . NCM will need home health orders and face to face. Left message with Tommi Rumps with [facility] await call back. Tommi Rumps accepted referral and request for [employer]   Patient has a wheelchair Jesse [name] that is in [place] with his daughter. She plans to bring wheelchair Jesse [name] to his son who lives in [place] , for his son to pick him up tomorrow.  His portable oxygen and wheelchair are both at his apartment, son would need to pick both up.   Patient asking if hospital can find him a way home. Patient's wheelchair is at his home . Ambulance transport home , however [facility] will file with his insurance , unable to give him a cost . He stated last time he was told $750.00. Patient will call his insurance company and then decide.   NCM will check back with patient.   1430 Patient called insurance company and was told his co pay for ambulance transport home regardless is $75.00 . Patient wants ambulance transport home at discharge. NCM confirmed address.  Patient Details  Name: Jesse [name] MRN: [id number] Date of Birth: February 09, 1951  Transition of Care [facility]) CM/SW Contact:    Marilu Favre, RN Phone Number: 05/22/2022, 12:36 PM  Clinical Narrative:                   Expected Discharge Plan: [place] Barriers to Discharge: Continued Medical Work up   Patient Goals and CMS Choice Patient states their goals for this hospitalization and ongoing recovery are:: to return to home CMS Medicare.gov Compare Post Acute Care list provided to:: Patient Choice offered to / list presented to :  Patient [place] ownership interest in [facility].provided to:: Patient    Expected Discharge Plan and Services   Discharge Planning Services: CM Consult Post Acute Care Choice: [place] arrangements for the past 2 months: Apartment (Independednt living)                 DME Arranged: N/A         HH Arranged: RN, PT [place] Agency: [place] Date [place]: 05/22/22 Time [place]: [id number] Representative spoke with at [place]: Cory left message  Prior Living Arrangements/Services Living arrangements for the past 2 months: Apartment ([place] living) Lives with:: Self Patient language and need for interpreter reviewed:: Yes Do you feel safe going back to the place where you live?: Yes      Need for Family Participation in Patient Care: Yes (Comment) Care giver support system in place?: Yes (comment) Current home services: DME Criminal Activity/Legal Involvement Pertinent to Current Situation/Hospitalization: No - Comment as needed  Activities of Daily Living Home Assistive Devices/Equipment: CBG Meter, Wheelchair ADL Screening (condition at time of admission) Patient's cognitive ability adequate to safely complete daily activities?: Yes Is the patient deaf or have difficulty hearing?: No Does the patient have difficulty seeing, even when wearing glasses/contacts?: No Does the patient have difficulty concentrating, remembering, or making decisions?: No Patient able to express need for assistance with ADLs?:  Yes Does the patient have difficulty dressing or bathing?: Yes Independently performs ADLs?: No Communication: Independent Dressing (OT): Needs assistance Is this a change from baseline?: Pre-admission baseline Grooming: Independent Feeding: Independent Bathing: Needs assistance Is this a change from baseline?: Pre-admission baseline Toileting: Independent with device (comment) In/Out Bed: Needs assistance Is this  a change from baseline?: Pre-admission baseline Walks in Home: Needs assistance (w/c bound) Is this a change from baseline?: Pre-admission baseline Does the patient have difficulty walking or climbing stairs?: Yes Weakness of Legs: Both Weakness of Arms/Hands: None  Permission Sought/Granted   Permission granted to share information with : No              Emotional Assessment Appearance:: Appears stated age Attitude/Demeanor/Rapport: Engaged   Orientation: : Oriented to Self, Oriented to Place, Oriented to  Time, Oriented to Situation Alcohol / Substance Use: Not Applicable Psych Involvement: No (comment)  Admission diagnosis:  SBO (small bowel obstruction) [K56.609] Patient Active Problem List   Diagnosis Date Noted   Renal mass 05/21/2022   Obesity hypoventilation syndrome 05/21/2022   Bleeding from colostomy stoma 04/16/2022   Colostomy complication 0000000   Wheelchair dependent 02/09/2022   SBO (small bowel obstruction) 02/09/2022   CKD (chronic kidney disease) stage 4, GFR 15-29 ml/min (HCC) - baseline SCr 2.2-2.5 02/09/2022   right facial numbness 11/01/2021   Essential hypertension 11/01/2021   Hyperlipidemia 11/01/2021   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/01/2021   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  09/15/2021   Bradycardia 09/01/2021   Benign hypertension with CKD (chronic kidney disease) stage III [id number]   Chronic systolic congestive heart failure 06/18/2021   Generalized anxiety disorder 06/18/2021   Type 2 diabetes mellitus with hyperlipidemia 05/14/2021   Chronic anemia 05/14/2021   OSA (obstructive sleep apnea) 05/13/2021   Chronic respiratory failure with hypoxia 05/13/2021   Obesity, Class III, BMI 40-49.9 (morbid obesity) 05/13/2021   Chronic pain of both knees 12/08/2020   Anxiety 12/08/2020   Type 2 diabetes mellitus    Former cigarette smoker    Alcohol use disorder in remission    Hard of hearing    Abscess 04/01/2015   Anemia  03/07/2015   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 [place] [address] Phone: [phone] Fax: [phone]     Social Determinants of Health (SDOH) Social History: SDOH Screenings   Food Insecurity: No Food Insecurity (02/16/2022)  Housing: Low Risk  (05/15/2021)  Transportation Needs: No Transportation Needs (02/16/2022)  Alcohol Screen: Low Risk  (05/15/2021)  Financial Resource Strain: Low Risk  (05/15/2021)  Tobacco Use: Medium Risk (05/21/2022)   SDOH Interventions:     Readmission Risk Interventions    05/15/2021    2:21 PM  Readmission Risk Prevention Plan  Transportation Screening Complete  PCP or Specialist Appt within 3-5 Days Complete  HRI or [place] Complete  Social Work Consult for [place] Planning/Counseling Complete  Palliative Care Screening Not Applicable  Medication Review Press photographer) Complete

## 2022-05-22 NOTE — Progress Notes (Signed)
Physical Therapy Evaluation Patient Details Name: Jesse [name] MRN: [id number] DOB: [birth year] Today's Date: 05/22/2022  History of Present Illness  72 y/o male admitted 4/1 with SBO. PMhx: typically WC bound but can walk short distances, morbid obesity, CKD III, chronic hypoxic resp failure on 2L O2, HTN, colectomy and colostomy, neuropathy, R knee surgery, PPM, dCHF, ventral hernia  Clinical Impression  Pt admitted with above diagnosis. From ILF, pt able to stand pivot transfer to power chair independently at baseline. Supervision level today for sit<>stand, tol limited to 37 sec standing only. Pt declines to sit up in recliner. Encouraged OOB for bowel health and to reduce secondary complications from immobility. RN to room to empty colostomy bag, pt sitting EOB.  Pt currently with functional limitations due to the deficits listed below (see PT Problem List). Pt will benefit from acute skilled PT to increase their independence and safety with mobility to allow discharge.          Recommendations for follow up therapy are one component of a multi-disciplinary discharge planning process, led by the attending physician.  Recommendations may be updated based on patient status, additional functional criteria and insurance authorization.  Follow Up Recommendations   HHPT (requests [facility] with therapist "Rob L."     Assistance Recommended at Discharge PRN  Patient can return home with the following  A little help with walking and/or transfers;A little help with bathing/dressing/bathroom;Assistance with cooking/housework;Assist for transportation    Equipment Recommendations None recommended by PT  Recommendations for Other Services       Functional Status Assessment Patient has had a recent decline in their functional status and demonstrates the ability to make significant improvements in function in a reasonable and predictable amount of time.     Precautions / Restrictions  Precautions Precautions: Fall Restrictions Weight Bearing Restrictions: No      Mobility  Bed Mobility Overal bed mobility: Needs Assistance Bed Mobility: Supine to Sit     Supine to sit: Modified independent (Device/Increase time)     General bed mobility comments: Mod I with bed mobility, requires extra time, no physical assist to rise to EOB.    Transfers Overall transfer level: Needs assistance Equipment used: Rolling walker (2 wheels) Transfers: Sit to/from Stand Sit to Stand: Supervision, From elevated surface           General transfer comment: Supervision for safety to stand from elevated bed surface (was unable to estimate approximate height of his own bed at home.) Crepitus noted Lt knee with mild instability. Using RW for support to stabilize. Declined getting into recliner. Stood 37 sec, baseline 60 sec per pt.    Ambulation/Gait               General Gait Details: States he is non ambulatory  Stairs            Wheelchair Mobility    Modified Rankin (Stroke Patients Only)       Balance Overall balance assessment: Needs assistance Sitting-balance support: No upper extremity supported, Feet supported Sitting balance-Leahy Scale: Good     Standing balance support: Bilateral upper extremity supported Standing balance-Leahy Scale: Poor                               Pertinent Vitals/Pain Pain Assessment Pain Assessment: No/denies pain    Home Living Family/patient expects to be discharged to:: Private residence Living Arrangements: Alone Available Help at Discharge: Friend(s);Available  PRN/intermittently;Family Type of Home: Independent living facility Home Access: Level entry       Home Layout: One level Home Equipment: Grab bars - tub/shower;Grab bars - toilet;Hand held shower head;Rollator (4 wheels);Wheelchair - power;Adaptive equipment;Shower seat Additional Comments: daughter is an OT    Prior Function Prior  Level of Function : Independent/Modified Independent             Mobility Comments: Uses power wheelchair, no longer ambulating or working with therapies. Had  short duration of HHPT for upper trap injury only. Stands to pivot and transfer at baseline. ADLs Comments: independent, staff performs cooking and cleaning     Hand Dominance   Dominant Hand: Right    Extremity/Trunk Assessment   Upper Extremity Assessment Upper Extremity Assessment: Defer to OT evaluation    Lower Extremity Assessment Lower Extremity Assessment: Generalized weakness       Communication   Communication: No difficulties  Cognition Arousal/Alertness: Awake/alert Behavior During Therapy: WFL for tasks assessed/performed Overall Cognitive Status: Within Functional Limits for tasks assessed                                          General Comments General comments (skin integrity, edema, etc.): Requests RN empty colostomy bag end of session. Encouraged pt out of bed to recliner later if unwilling during therapy session.    Exercises     Assessment/Plan    PT Assessment Patient needs continued PT services  PT Problem List Decreased strength;Decreased range of motion;Decreased activity tolerance;Decreased balance;Decreased mobility;Decreased knowledge of use of DME;Decreased safety awareness;Decreased knowledge of precautions;Cardiopulmonary status limiting activity;Obesity       PT Treatment Interventions DME instruction;Functional mobility training;Therapeutic activities;Therapeutic exercise;Balance training;Neuromuscular re-education;Patient/family education    PT Goals (Current goals can be found in the Care Plan section)  Acute Rehab PT Goals Patient Stated Goal: Get stronger with HHPT PT Goal Formulation: With patient Time For Goal Achievement: 06/05/22 Potential to Achieve Goals: Good    Frequency Min 3X/week     Co-evaluation               AM-PAC PT "6  Clicks" Mobility  Outcome Measure Help needed turning from your back to your side while in a flat bed without using bedrails?: None Help needed moving from lying on your back to sitting on the side of a flat bed without using bedrails?: None Help needed moving to and from a bed to a chair (including a wheelchair)?: A Little Help needed standing up from a chair using your arms (e.g., wheelchair or bedside chair)?: A Little Help needed to walk in hospital room?: Total Help needed climbing 3-5 steps with a railing? : Total 6 Click Score: 16    End of Session Equipment Utilized During Treatment: Oxygen Activity Tolerance: Patient tolerated treatment well;Other (comment) (Self limiting, wants to stay in bed today.) Patient left: in bed;with call bell/phone within reach;with bed alarm set;with nursing/sitter in room (Sitting EOB, RN in room emptying colostomy bag) Nurse Communication: Other (comment) (Requests colostomy be emptied) PT Visit Diagnosis: Unsteadiness on feet (R26.81);Muscle weakness (generalized) (M62.81);Difficulty in walking, not elsewhere classified (R26.2)    Time: [id number] PT Time Calculation (min) (ACUTE ONLY): 28 min   Charges:   PT Evaluation $PT Eval Low Complexity: 1 Low PT Treatments $Therapeutic Activity: 8-22 mins        Jesse [name], PT, DPT Physical Therapist Acute  [facility]   Ellouise Newer 05/22/2022, 11:44 AM

## 2022-05-22 NOTE — Progress Notes (Signed)
Patient called RN into the room because his colostomy was full we emptied all together 943ml, when we got done with that I happened to look up and his NG tube was out completley on the floor. Pt has had a total of 61ml out this morning so far. Brooke,PA notified waiting for response and further instruction.

## 2022-05-22 NOTE — Progress Notes (Signed)
PROGRESS NOTE    AENEAS BEECHAM  [id number] DOB: 1951-02-19 DOA: 05/21/2022 PCP: [name], NP   Brief Narrative:  72 year old with history of multiple bowel surgeries status post colectomy/end ileostomy, DM2, morbid obesity, ambulatory dysfunction, pacemaker placement, chronic diastolic CHF, CKD stage IV, HTN presented with abdominal pain.  Patient was found to have small bowel obstruction on the CT scan.  General surgery was consulted.   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  Has multiple risk factors. - Currently conservative management.  General surgery is following. Improved this morning. Adv diet as tolerated.    Left renal mass, 3.6 cm -Follow-up with primary care provider   Diabetes mellitus type 2, insulin-dependent Recent A1c 8.0.  Home Jardiance and Victoza on hold - Semglee 15 units at bedtime.  Sliding scale and Accu-Cheks.   HTN -Continue Imdur -Previously on losartan but he does not appear to be taking this currently   Morbid obesity -Weight loss should be encouraged -Outpatient PCP/bariatric medicine f/u encouraged    Prior TIA/CVA -Hold Plavix for now   Stage 4 CKD Creatinine around baseline of 2.5   Ambulatory dysfunction -Chronic wheelchair dependence -Continue Requip -Hold Tizanidine, change to IV  Robaxin prn   [facility] Some atelectasis seen on the CT scan.  Supplemental oxygen   Pacemaker -h/o sick sinus   Depression -Prior ?SI during admission in December -On alprazolam at home; will change to IV Ativan for now -Continue duloxetine, trazodone   Chronic diastolic dysfunction -Last echo was in 10/2021 with grade 1 diastolic dysfunction and preserved EF -Appears compensated -Hold lasix   OSA -Uses CPAP at home -Unable to use CPAP currently   BPH -Continue alfuzosin    HLD -Continue rosuvastatin      DVT prophylaxis: SCDs Start: 05/21/22 0919 Code Status: Full code Family Communication:   Status is: Inpatient Small bowel obstruction.  Continue hospital stay      Diet Orders (From admission, onward)     Start     Ordered   05/21/22 0919  Diet NPO time specified  Diet effective now        05/21/22 [id number]            Subjective:  Patient doing better this morning He was able to pass a lot of gas and stool.  Examination:  General exam: Appears calm and comfortable  Respiratory system: Clear to auscultation. Respiratory effort normal. Cardiovascular system: S1 & S2 heard, RRR. No JVD, murmurs, rubs, gallops or clicks. No pedal edema. Gastrointestinal system: Abdomen is nondistended, soft and nontender. No organomegaly or masses felt. Normal bowel sounds heard. Central nervous system: Alert and oriented. No focal neurological deficits. Extremities: Symmetric 5 x 5 power. Skin: No rashes, lesions or ulcers Psychiatry: Judgement and insight appear normal. Mood & affect appropriate. \ \ Objective: Vitals:   05/22/22 0356 05/22/22 0500 05/22/22 0600 05/22/22  0749  BP: 139/67   (!) 142/76  Pulse: 94   88  Resp: 18   18  Temp: 98.7 F (37.1 C)   97.8 F (36.6 C)  TempSrc: Oral   Oral  SpO2: 94%   93%  Weight:  (!) 163.4 kg (!) 160.7 kg     Intake/Output Summary (Last 24 hours) at 05/22/2022 0855 Last data filed at 05/22/2022 0800 Gross per 24 hour   Intake 1205.29 ml  Output 4650 ml  Net -3444.71 ml   Filed Weights   05/22/22 0500 05/22/22 0600  Weight: (!) 163.4 kg (!) 160.7 kg    Scheduled Meds:  alfuzosin  10 mg Oral Q breakfast   diatrizoate meglumine-sodium  90 mL Per NG tube Once   DULoxetine  60 mg Oral Daily   insulin aspart  0-20 Units Subcutaneous TID WC   insulin aspart  0-5 Units Subcutaneous QHS   insulin glargine-yfgn  50 Units Subcutaneous QHS   isosorbide mononitrate  60 mg Oral Daily   pantoprazole (PROTONIX) IV  40 mg Intravenous Daily   rOPINIRole  2 mg Oral QHS   rosuvastatin  10 mg Oral Daily   topiramate  25 mg Oral Daily   traZODone  100 mg Oral QHS   Continuous Infusions:  lactated ringers 75 mL/hr at 05/22/22 0831   methocarbamol (ROBAXIN) IV Stopped (05/21/22 2115)   promethazine (PHENERGAN) injection (IM or IVPB) Stopped (05/21/22 2032)    Nutritional status     Body mass index is 48.05 kg/m.  Data Reviewed:   CBC: Recent Labs  Lab 05/21/22 0356 05/22/22 0755  WBC 11.0* 8.9  NEUTROABS 8.9*  --   HGB 11.0* 12.7*  HCT 36.4* 41.0  MCV 93.3 91.1  PLT 209 [id number]   Basic Metabolic Panel: Recent Labs  Lab 05/21/22 0356  NA 133*  K 4.1  CL 102  CO2 22  GLUCOSE 263*  BUN 26*  CREATININE 1.70*  CALCIUM 8.5*   GFR: Estimated Creatinine Clearance: 61.6 mL/min (A) (by C-G formula based on SCr of 1.7 mg/dL (H)). Liver Function Tests: Recent Labs  Lab 05/21/22 0356  AST 14*  ALT 14  ALKPHOS 80  BILITOT 0.4  PROT 6.5  ALBUMIN 3.2*   Recent Labs  Lab 05/21/22 0356  LIPASE 60*   No results for input(s): "AMMONIA" in the last 168 hours. Coagulation Profile: No results for input(s): "INR", "PROTIME" in the last 168 hours. Cardiac Enzymes: No results for input(s): "CKTOTAL", "CKMB", "CKMBINDEX", "TROPONINI" in the last 168 hours. BNP (last 3 results) No results for input(s): "PROBNP" in the last 8760 hours. HbA1C: No results for input(s): "HGBA1C" in the last 72  hours. CBG: Recent Labs  Lab 05/21/22 0915 05/21/22 1228 05/21/22 1738 05/21/22 2014 05/22/22 0745  GLUCAP 285* 269* 225* 179* 171*   Lipid Profile: No results for input(s): "CHOL", "HDL", "LDLCALC", "TRIG", "CHOLHDL", "LDLDIRECT" in the last 72 hours. Thyroid Function Tests: No results for input(s): "TSH", "T4TOTAL", "FREET4", "T3FREE", "THYROIDAB" in the last 72 hours. Anemia Panel: No results for input(s): "VITAMINB12", "FOLATE", "FERRITIN", "TIBC", "IRON", "RETICCTPCT" in the last 72 hours. Sepsis Labs: No results for input(s): "PROCALCITON", "LATICACIDVEN" in the last 168 hours.  No results found for this or any previous visit (from the past 240 hour(s)).       Radiology Studies: DG Abd Portable 1V  Result Date: 05/21/2022 CLINICAL DATA:  NG tube placement EXAM: PORTABLE ABDOMEN - 1 VIEW COMPARISON:  05/21/2022 FINDINGS: NG tube is within  the distended stomach. IMPRESSION: NG tube in the stomach. Electronically Signed   By: Rolm Baptise M.D.   On: 05/21/2022 18:10   DG Abd Portable 1V-Small Bowel Protocol-Position Verification  Result Date: 05/21/2022 CLINICAL DATA:  NG tube placement EXAM: PORTABLE ABDOMEN - 1 VIEW COMPARISON:  Previous studies including the CT done earlier today FINDINGS: NG tube is not seen in the visualized upper abdomen and lower thorax. Bowel gas pattern is nonspecific. IMPRESSION: NG tube is not seen in the lower thorax and upper abdomen. NG tube may be coiled within the oropharynx or hypopharynx. Repositioning of NG tube should be considered. Electronically Signed   By: Elmer Picker M.D.   On: 05/21/2022 13:37   CT ABDOMEN PELVIS WO CONTRAST  Result Date: 05/21/2022 CLINICAL DATA:  72 year old male with suspected bowel obstruction. EXAM: CT ABDOMEN AND PELVIS WITHOUT CONTRAST TECHNIQUE: Multidetector CT imaging of the abdomen and pelvis was performed following the standard protocol without IV contrast. RADIATION DOSE REDUCTION: This exam was  performed according to the departmental dose-optimization program which includes automated exposure control, adjustment of the mA and/or kV according to patient size and/or use of iterative reconstruction technique. COMPARISON:  CT of the abdomen and pelvis 02/09/2022. FINDINGS: Lower chest: Small chronic left pleural effusion with apparent pleural thickening (poorly evaluated on today's noncontrast CT examination), and mass-like areas in the inferior aspect of the left lower lobe and inferior segment of the lingula, similar to prior examination 02/09/2022, presumably reflective of chronic rounded atelectasis (although underlying malignancy is difficult to entirely exclude) pacemaker leads in the right atrium and right ventricle. Hepatobiliary: No definite suspicious cystic or solid hepatic lesions are confidently identified on today's noncontrast CT examination. Unenhanced appearance of the gallbladder is unremarkable. Pancreas: No definite pancreatic mass or peripancreatic fluid collections or inflammatory changes are noted on today's noncontrast CT examination. Spleen: Unremarkable. Adrenals/Urinary Tract: Mild bilateral renal atrophy. Contour abnormality in the lower pole of the left kidney again noted where there is a slightly exophytic lesion estimated to measure approximately 3.6 cm in diameter (axial image 50 of series 4) which is slightly increased in size compared to prior study 02/09/2022, concerning for potential renal neoplasm. Mild bilateral renal atrophy. No hydroureteronephrosis. Urinary bladder is unremarkable in appearance. Bilateral adrenal glands are unremarkable in appearance. Stomach/Bowel: There is a large ventral hernia which is incompletely imaged. This contains portions of the stomach, small bowel and colon. Multiple dilated loops of small bowel are noted within the hernia measuring up to 4 cm in diameter, several of which have small air-fluid levels within them. Other loops of small bowel  within the hernia. Essentially completely decompressed as they exit, apparently loops of distal small bowel. There also appears to be a diverting right upper quadrant colostomy. Postoperative changes of partial colonic resection are noted with a suture line in the sigmoid colon. The appendix is not confidently identified and may be surgically absent. Regardless, there are no inflammatory changes noted adjacent to the cecum to suggest the presence of an acute appendicitis at this time. Vascular/Lymphatic: Atherosclerosis in the abdominal aorta and pelvic vasculature. No definite lymphadenopathy noted in the abdomen or pelvis. Reproductive: Prostate gland and seminal vesicles are unremarkable in appearance. Other: No significant volume of ascites.  No pneumoperitoneum. Musculoskeletal: There are no aggressive appearing lytic or blastic lesions noted in the visualized portions of the skeleton. IMPRESSION: 1. Massive ventral hernia containing portions of the stomach, small bowel and colon with multiple dilated loops of small bowel within  the hernia which measure up to 4 cm in diameter concerning for small bowel obstruction. Distal small bowel is completely decompressed. Surgical consultation is recommended. 2. Enlarging mass-like area in the lower pole of the left kidney currently measuring 3.6 cm in diameter, poorly evaluated on today's noncontrast CT examination, but concerning for potential neoplasm. 3. Small chronic left pleural effusion with mass-like areas in the base of the left lung very similar to prior CT examination from 02/09/2022, favored to represent areas of chronic rounded atelectasis, with neoplasm not excluded, but not favored at this time. 4. Additional postoperative changes and incidental findings, similar to prior studies, as above. Electronically Signed   By: Vinnie Langton M.D.   On: 05/21/2022 06:12           LOS: 1 day   Time spent= 35 mins    Tiawana Forgy Arsenio Loader, MD [facility]  If 7PM-7AM, please contact night-coverage  05/22/2022, 8:55 AM

## 2022-05-22 NOTE — Progress Notes (Signed)
[facility] Progress Note     Subjective: CC-  NG placed last night with over 3L out since placement. Feeling better with less abdominal pain and bloating. States that he had to burp his ostomy appliance and he passed a small amount of stool.  Objective: Vital signs in last 24 hours: Temp:  [97.6 F (36.4 C)-99.2 F (37.3 C)] 97.8 F (36.6 C) (04/02 0749) Pulse Rate:  [70-94] 88 (04/02 0749) Resp:  [17-18] 18 (04/02 0749) BP: (139-165)/(67-76) 142/76 (04/02 0749) SpO2:  [93 %-97 %] 93 % (04/02 0749) Weight:  [160.7 kg-163.4 kg] 160.7 kg (04/02 0600) Last BM Date : 05/21/22  Intake/Output from previous day: 04/01 0701 - 04/02 0700 In: 1205.3 [I.V.:991.3; NG/GT:60; IV Piggyback:154] Out: 4450 [Urine:1300; Emesis/NG output:3150] Intake/Output this shift: Total I/O In: 0  Out: 200 [Urine:200]  PE: Gen:  Alert, NAD, pleasant Abd: Obese, soft, large ventral hernia in the central/epigastric region of his abdomen that is soft and seems to slide freely but is not fully reducible/ no overlying skin changes, nontender. Left sided, more lower abdominal hernia that is firm but nontender and not reducible/ no overlying skin changes. No rigidity or guarding. Ostomy bag appliance prevents visualization of stoma - there is a small amount of liquid stool and air in bag.   Lab Results:  Recent Labs    05/21/22 0356 05/22/22 0755  WBC 11.0* 8.9  HGB 11.0* 12.7*  HCT 36.4* 41.0  PLT 209 248   BMET Recent Labs    05/21/22 0356  NA 133*  K 4.1  CL 102  CO2 22  GLUCOSE 263*  BUN 26*  CREATININE 1.70*  CALCIUM 8.5*   PT/INR No results for input(s): "LABPROT", "INR" in the last 72 hours. CMP     Component Value Date/Time   NA 133 (L) 05/21/2022 0356   NA 139 12/29/2021 0000   K 4.1 05/21/2022 0356   CL 102 05/21/2022 0356   CO2 22 05/21/2022 0356   GLUCOSE 263 (H) 05/21/2022 0356   BUN 26 (H) 05/21/2022 0356   BUN 51 (A) 12/29/2021 0000   CREATININE 1.70 (H)  05/21/2022 0356   CREATININE 2.21 (H) 02/20/2022 1443   CALCIUM 8.5 (L) 05/21/2022 0356   PROT 6.5 05/21/2022 0356   ALBUMIN 3.2 (L) 05/21/2022 0356   AST 14 (L) 05/21/2022 0356   ALT 14 05/21/2022 0356   ALKPHOS 80 05/21/2022 0356   BILITOT 0.4 05/21/2022 0356   GFRNONAA 42 (L) 05/21/2022 0356   GFRAA (L) 04/08/2008 1240    54        The eGFR has been calculated using the MDRD equation. This calculation has not been validated in all clinical situations. eGFR's persistently <60 mL/min signify possible Chronic Kidney Disease.   Lipase     Component Value Date/Time   LIPASE 60 (H) 05/21/2022 0356       Studies/Results: DG Abd Portable 1V  Result Date: 05/21/2022 CLINICAL DATA:  NG tube placement EXAM: PORTABLE ABDOMEN - 1 VIEW COMPARISON:  05/21/2022 FINDINGS: NG tube is within the distended stomach. IMPRESSION: NG tube in the stomach. Electronically Signed   By: Rolm Baptise M.D.   On: 05/21/2022 18:10   DG Abd Portable 1V-Small Bowel Protocol-Position Verification  Result Date: 05/21/2022 CLINICAL DATA:  NG tube placement EXAM: PORTABLE ABDOMEN - 1 VIEW COMPARISON:  Previous studies including the CT done earlier today FINDINGS: NG tube is not seen in the visualized upper abdomen and lower thorax. Bowel gas pattern  is nonspecific. IMPRESSION: NG tube is not seen in the lower thorax and upper abdomen. NG tube may be coiled within the oropharynx or hypopharynx. Repositioning of NG tube should be considered. Electronically Signed   By: Elmer Picker M.D.   On: 05/21/2022 13:37   CT ABDOMEN PELVIS WO CONTRAST  Result Date: 05/21/2022 CLINICAL DATA:  72 year old male with suspected bowel obstruction. EXAM: CT ABDOMEN AND PELVIS WITHOUT CONTRAST TECHNIQUE: Multidetector CT imaging of the abdomen and pelvis was performed following the standard protocol without IV contrast. RADIATION DOSE REDUCTION: This exam was performed according to the departmental dose-optimization program  which includes automated exposure control, adjustment of the mA and/or kV according to patient size and/or use of iterative reconstruction technique. COMPARISON:  CT of the abdomen and pelvis 02/09/2022. FINDINGS: Lower chest: Small chronic left pleural effusion with apparent pleural thickening (poorly evaluated on today's noncontrast CT examination), and mass-like areas in the inferior aspect of the left lower lobe and inferior segment of the lingula, similar to prior examination 02/09/2022, presumably reflective of chronic rounded atelectasis (although underlying malignancy is difficult to entirely exclude) pacemaker leads in the right atrium and right ventricle. Hepatobiliary: No definite suspicious cystic or solid hepatic lesions are confidently identified on today's noncontrast CT examination. Unenhanced appearance of the gallbladder is unremarkable. Pancreas: No definite pancreatic mass or peripancreatic fluid collections or inflammatory changes are noted on today's noncontrast CT examination. Spleen: Unremarkable. Adrenals/Urinary Tract: Mild bilateral renal atrophy. Contour abnormality in the lower pole of the left kidney again noted where there is a slightly exophytic lesion estimated to measure approximately 3.6 cm in diameter (axial image 50 of series 4) which is slightly increased in size compared to prior study 02/09/2022, concerning for potential renal neoplasm. Mild bilateral renal atrophy. No hydroureteronephrosis. Urinary bladder is unremarkable in appearance. Bilateral adrenal glands are unremarkable in appearance. Stomach/Bowel: There is a large ventral hernia which is incompletely imaged. This contains portions of the stomach, small bowel and colon. Multiple dilated loops of small bowel are noted within the hernia measuring up to 4 cm in diameter, several of which have small air-fluid levels within them. Other loops of small bowel within the hernia. Essentially completely decompressed as they  exit, apparently loops of distal small bowel. There also appears to be a diverting right upper quadrant colostomy. Postoperative changes of partial colonic resection are noted with a suture line in the sigmoid colon. The appendix is not confidently identified and may be surgically absent. Regardless, there are no inflammatory changes noted adjacent to the cecum to suggest the presence of an acute appendicitis at this time. Vascular/Lymphatic: Atherosclerosis in the abdominal aorta and pelvic vasculature. No definite lymphadenopathy noted in the abdomen or pelvis. Reproductive: Prostate gland and seminal vesicles are unremarkable in appearance. Other: No significant volume of ascites.  No pneumoperitoneum. Musculoskeletal: There are no aggressive appearing lytic or blastic lesions noted in the visualized portions of the skeleton. IMPRESSION: 1. Massive ventral hernia containing portions of the stomach, small bowel and colon with multiple dilated loops of small bowel within the hernia which measure up to 4 cm in diameter concerning for small bowel obstruction. Distal small bowel is completely decompressed. Surgical consultation is recommended. 2. Enlarging mass-like area in the lower pole of the left kidney currently measuring 3.6 cm in diameter, poorly evaluated on today's noncontrast CT examination, but concerning for potential neoplasm. 3. Small chronic left pleural effusion with mass-like areas in the base of the left lung very similar to prior  CT examination from 02/09/2022, favored to represent areas of chronic rounded atelectasis, with neoplasm not excluded, but not favored at this time. 4. Additional postoperative changes and incidental findings, similar to prior studies, as above. Electronically Signed   By: Vinnie Langton M.D.   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 Faythe Ghee in [facility], he was left open and went back for washout and closure the following day - NG placed last night with high volume output. He is starting to have a small amount of bowel function. Will continue NG to LIWS for decompression this morning, once output slows down plan gastrograffin/ delayed film for SBO protocol.   FEN - IVF, NPO/NGT to LIWS. 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    LOS: 1 day    [place], [facility] 05/22/2022, 8:54 AM Please see Amion for pager number during day hours 7:00am-4:30pm

## 2022-05-22 NOTE — Progress Notes (Signed)
OT Cancellation Note and Discharge  Patient Details Name: Jesse [name] MRN: [id number] DOB: 04-Feb-1951   Cancelled Treatment:    Reason Eval/Treat Not Completed: Other (comment). In to speak with pt with notation in chart that pt's daughter is an OT. In speaking with pt he does not feel that there is anything that OT has to offer him and if there is an issue he encounters at home his daughter will help him problem solve it.  No OT needs currently identified, we will sign off.  [name] [phone]    [name] 05/22/2022, 12:35 PM

## 2022-05-23 DIAGNOSIS — K56609 Unspecified intestinal obstruction, unspecified as to partial versus complete obstruction: Secondary | ICD-10-CM | POA: Diagnosis not present

## 2022-05-23 LAB — BASIC METABOLIC PANEL
Anion gap: 11 (ref 5–15)
BUN: 31 mg/dL — ABNORMAL HIGH (ref 8–23)
CO2: 21 mmol/L — ABNORMAL LOW (ref 22–32)
Calcium: 8.5 mg/dL — ABNORMAL LOW (ref 8.9–10.3)
Chloride: 105 mmol/L (ref 98–111)
Creatinine, Ser: 1.91 mg/dL — ABNORMAL HIGH (ref 0.61–1.24)
GFR, Estimated: 37 mL/min — ABNORMAL LOW (ref >60)
Glucose, Bld: 182 mg/dL — ABNORMAL HIGH (ref 70–99)
Potassium: 3.5 mmol/L (ref 3.5–5.1)
Sodium: 137 mmol/L (ref 135–145)

## 2022-05-23 LAB — CBC
HCT: 37.3 % — ABNORMAL LOW (ref 39.0–52.0)
Hemoglobin: 11.2 g/dL — ABNORMAL LOW (ref 13.0–17.0)
MCH: 27.7 pg (ref 26.0–34.0)
MCHC: 30 g/dL (ref 30.0–36.0)
MCV: 92.1 fL (ref 80.0–100.0)
Platelets: 198 10*3/uL (ref 150–400)
RBC: 4.05 MIL/uL — ABNORMAL LOW (ref 4.22–5.81)
RDW: 14.2 % (ref 11.5–15.5)
WBC: 7.8 10*3/uL (ref 4.0–10.5)
nRBC: 0 % (ref 0.0–0.2)

## 2022-05-23 LAB — GLUCOSE, CAPILLARY
Glucose-Capillary: 191 mg/dL — ABNORMAL HIGH (ref 70–99)
Glucose-Capillary: 207 mg/dL — ABNORMAL HIGH (ref 70–99)
Glucose-Capillary: 252 mg/dL — ABNORMAL HIGH (ref 70–99)
Glucose-Capillary: 201 mg/dL — ABNORMAL HIGH (ref 70–99)

## 2022-05-23 LAB — MAGNESIUM: Magnesium: 2.2 mg/dL (ref 1.7–2.4)

## 2022-05-23 NOTE — TOC Transition Note (Signed)
Transition of Care [facility]) - CM/SW Discharge Note   Patient Details  Name: STORME LIEN MRN: [id number] Date of Birth: [birth year]  Transition of Care [facility]) CM/SW Contact:  Coralee Pesa, [place] Phone Number: 05/23/2022, 3:38 PM   Clinical Narrative:    Pt to be transported to [place] via [place], at pt's request. Pt to return home with [facility] supports. TOC to sign off at this time, re consult for any further needs.    Final next level of care: Home w Home Health Services Barriers to Discharge: Continued Medical Work up   Patient Goals and CMS Choice CMS Medicare.gov Compare Post Acute Care list provided to:: Patient Choice offered to / list presented to : Patient  Discharge Placement                Patient chooses bed at:  ([place]) Patient to be transferred to facility by: [place] Name of family member notified: Patient Patient and family notified of of transfer: 05/23/22  Discharge Plan and Services Additional resources added to the After Visit Summary for     Discharge Planning Services: CM Consult Post Acute Care Choice: Home Health          DME Arranged: N/A         HH Arranged: RN, PT [place] Agency: [place] Date [facility] Agency Contacted: 05/22/22 Time HH Agency Contacted: [id number] Representative spoke with at [place]: Tommi Rumps left message  Social Determinants of Health (SDOH) Interventions SDOH Screenings   Food Insecurity: No Food Insecurity (02/16/2022)  Housing: Low Risk  (05/15/2021)  Transportation Needs: No Transportation Needs (02/16/2022)  Alcohol Screen: Low Risk  (05/15/2021)  Financial Resource Strain: Low Risk  (05/15/2021)  Tobacco Use: Medium Risk (05/21/2022)     Readmission Risk Interventions    05/15/2021    2:21 PM  Readmission Risk Prevention Plan  Transportation Screening Complete  PCP or Specialist Appt within 3-5 Days Complete  HRI or [place] Complete  Social Work Consult for [facility]  Planning/Counseling Complete  Palliative Care Screening Not Applicable  Medication Review [employer] photographer) Complete

## 2022-05-23 NOTE — Progress Notes (Signed)
[facility] Progress Note     Subjective: CC-  Feeling much better. Denies abdominal pain, nausea, vomiting. Hernias much softer. Tolerating clear liquids and continues to have ostomy output.  Objective: Vital signs in last 24 hours: Temp:  [97.4 F (36.3 C)-98.2 F (36.8 C)] 98.2 F (36.8 C) (04/03 0804) Pulse Rate:  [71-76] 71 (04/03 0804) Resp:  [17-18] 18 (04/03 0804) BP: (120-153)/(40-72) 139/40 (04/03 0804) SpO2:  [96 %-99 %] 99 % (04/03 0804) Weight:  [160.3 kg] 160.3 kg (04/03 0246) Last BM Date : 05/23/22  Intake/Output from previous day: 04/02 0701 - 04/03 0700 In: 1987 [P.O.:480; I.V.:1507] Out: 2850 [Urine:800; Emesis/NG output:450; Stool:1600] Intake/Output this shift: Total I/O In: 480 [P.O.:480] Out: 425 [Urine:275; Stool:150]  PE: Gen:  Alert, NAD, pleasant Abd: Obese, soft, large ventral hernia in the central/epigastric region of his abdomen that is soft and seems to slide freely but is not fully reducible/ no overlying skin changes, nontender. Left sided, more lower abdominal hernia also softer and nontender and not reducible/ no overlying skin changes. No rigidity or guarding. Ostomy bag appliance prevents visualization of stoma - there is liquid stool and air in bag.   Lab Results:  Recent Labs    05/22/22 0755 05/23/22 0348  WBC 8.9 7.8  HGB 12.7* 11.2*  HCT 41.0 37.3*  PLT 248 198   BMET Recent Labs    05/22/22 0755 05/23/22 0348  NA 141 137  K 3.9 3.5  CL 109 105  CO2 21* 21*  GLUCOSE 162* 182*  BUN 31* 31*  CREATININE 1.81* 1.91*  CALCIUM 9.0 8.5*   PT/INR No results for input(s): "LABPROT", "INR" in the last 72 hours. CMP     Component Value Date/Time   NA 137 05/23/2022 0348   NA 139 12/29/2021 0000   K 3.5 05/23/2022 0348   CL 105 05/23/2022 0348   CO2 21 (L) 05/23/2022 0348   GLUCOSE 182 (H) 05/23/2022 0348   BUN 31 (H) 05/23/2022 0348   BUN 51 (A) 12/29/2021 0000   CREATININE 1.91 (H) 05/23/2022 0348    CREATININE 2.21 (H) 02/20/2022 1443   CALCIUM 8.5 (L) 05/23/2022 0348   PROT 6.5 05/21/2022 0356   ALBUMIN 3.2 (L) 05/21/2022 0356   AST 14 (L) 05/21/2022 0356   ALT 14 05/21/2022 0356   ALKPHOS 80 05/21/2022 0356   BILITOT 0.4 05/21/2022 0356   GFRNONAA 37 (L) 05/23/2022 0348   GFRAA (L) 04/08/2008 1240    54        The eGFR has been calculated using the MDRD equation. This calculation has not been validated in all clinical situations. eGFR's persistently <60 mL/min signify possible Chronic Kidney Disease.   Lipase     Component Value Date/Time   LIPASE 60 (H) 05/21/2022 0356       Studies/Results: DG Abd Portable 1V  Result Date: 05/22/2022 CLINICAL DATA:  Small-bowel obstruction. EXAM: PORTABLE ABDOMEN - 1 VIEW COMPARISON:  KUB and CT abdomen/pelvis 1 day prior FINDINGS: Image quality is degraded by patient body habitus. There is gaseous distention of the stomach and small bowel in the left hemiabdomen measuring up to 5.6 cm. The appearance is grossly similar to the prior studies. There is no definite free intraperitoneal air. No enteric catheter is seen within the field of view. IMPRESSION: 1. Grossly similar gaseous distention of the stomach and small bowel consistent with small-bowel obstruction. 2. No enteric catheter is seen within the field of view. Electronically Signed   By:  Valetta Mole M.D.   On: 05/22/2022 10:35   DG Abd Portable 1V  Result Date: 05/21/2022 CLINICAL DATA:  NG tube placement EXAM: PORTABLE ABDOMEN - 1 VIEW COMPARISON:  05/21/2022 FINDINGS: NG tube is within the distended stomach. IMPRESSION: NG tube in the stomach. Electronically Signed   By: Rolm Baptise M.D.   On: 05/21/2022 18:10   DG Abd Portable 1V-Small Bowel Protocol-Position Verification  Result Date: 05/21/2022 CLINICAL DATA:  NG tube placement EXAM: PORTABLE ABDOMEN - 1 VIEW COMPARISON:  Previous studies including the CT done earlier today FINDINGS: NG tube is not seen in the visualized  upper abdomen and lower thorax. Bowel gas pattern is nonspecific. IMPRESSION: NG tube is not seen in the lower thorax and upper abdomen. NG tube may be coiled within the oropharynx or hypopharynx. Repositioning of NG tube should be considered. Electronically Signed   By: Elmer Picker M.D.   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 Faythe Ghee in [facility], he was left open and went back for washout and closure the following day - Tolerating clear liquids and having bowel function. Advance to full liquids and as tolerated to soft diet. [place] for discharge from surgical standpoint if he tolerates full liquids. Discussed slowly advancing diet at home with patient. Plan discussed with primary team.   FEN - FLD>>soft diet ID - none VTE - SCDs, okay for chemical ppx from a general surgery standpoint Foley - none   Recent TIA vs CVA 10/2021 on plavix (last dose 3/31 AM) - ok to resume plavix DM HLD OSA Chronic respiratory failure on home O2 Morbid obesity BMI 48.82 SSS s/p PPM CHF CAD with h/o cardiac stents HTN CKD-IV  I reviewed last 24 h vitals and pain scores and last 48 h intake and output.    LOS: 2 days    [place] [facility] 05/23/2022, 8:56 AM Please see Amion for pager number during day hours 7:00am-4:30pm

## 2022-05-23 NOTE — Discharge Summary (Signed)
Physician Discharge Summary  Jesse [name] [id number] DOB: 07-20-1950 DOA: 05/21/2022  PCP: [name], NP  Admit date: 05/21/2022 Discharge date: 05/23/2022  Admitted From: Home Disposition:  Home  Recommendations for Outpatient Follow-up:  Follow up with PCP in 1-2 weeks Please obtain BMP/CBC in one week your next doctors visit.    Discharge Condition: Stable CODE STATUS: Full code Diet recommendation: Diet per general surgery  Brief/Interim Summary:  72 year old with history of multiple bowel surgeries status post colectomy/end ileostomy, DM2, morbid obesity, ambulatory dysfunction, pacemaker placement, chronic diastolic CHF, CKD stage IV, HTN presented with abdominal pain.  Patient was found to have small bowel obstruction on the CT scan.  General surgery was consulted.  For short time he required NG tube and rapidly started doing well. He was tolerating orals without any issues.  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  Has multiple risk factors. - Briefly requiring G-tube, seen by general surgery.  Improved, tolerating orals.  Ventral hernia has improved as well  Left renal mass, 3.6 cm -Follow-up with primary care provider   Diabetes mellitus type 2, insulin-dependent Recent A1c 8.0.  Resume  regimen upon discharge.  HTN Resume prior to admission meds   Morbid obesity -Weight loss should be encouraged -Outpatient PCP/bariatric medicine f/u encouraged    Prior TIA/CVA Plavix   Stage 4 CKD Creatinine around baseline of 2.5   Ambulatory dysfunction -Chronic wheelchair dependence -Continue Requip -Hold Tizanidine, change to IV Robaxin prn   OHS Some atelectasis seen on the CT scan.  Supplemental oxygen   Pacemaker -h/o sick sinus   Depression -Prior ?SI during admission in December Resume home meds   Chronic diastolic dysfunction -Last echo was in 10/2021 with grade 1 diastolic dysfunction and preserved EF -Appears compensated Resume home Lasix   OSA -Uses CPAP at home -Unable to use CPAP currently   BPH -Continue alfuzosin    HLD -Continue rosuvastatin    Discharge Diagnoses:  Principal Problem:   SBO (small bowel obstruction) Active Problems:   Ventral hernia   OSA (obstructive sleep apnea)   Obesity, Class III, BMI 40-49.9 (morbid obesity)   Wheelchair dependent   CKD (chronic kidney disease) stage 4, GFR 15-29 ml/min (HCC) - baseline SCr 2.2-2.5   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  Wanting to go home  Discharge Exam: Vitals:   05/23/22 0246 05/23/22 0804  BP: (!) 153/72 (!) 139/40  Pulse: 71 71  Resp: 17 18  Temp: 98.1 F (36.7 C) 98.2 F (36.8 C)  SpO2: 96% 99%   Vitals:   05/22/22  1642 05/22/22 2003 05/23/22 0246 05/23/22 0804  BP: (!) 120/50 134/68 (!) 153/72 (!) 139/40  Pulse: 76 73 71 71  Resp: 18 17 17 18   Temp: 97.6 F (36.4 C) (!) 97.4 F (36.3 C) 98.1 F (36.7 C) 98.2 F (36.8 C)  TempSrc: Oral Oral Oral Oral  SpO2: 97% 98% 96% 99%  Weight:   (!) 160.3 kg     General: Pt is  alert, awake, not in acute distress Cardiovascular: RRR, S1/S2 +, no rubs, no gallops Respiratory: CTA bilaterally, no wheezing, no rhonchi Abdominal: Soft, NT, ND, bowel sounds + Extremities: no edema, no cyanosis  Discharge Instructions   Allergies as of 05/23/2022       Reactions   Tape Rash   If left on for a long time        Medication List     TAKE these medications    Accu-Chek Guide test strip Generic drug: glucose blood USE TO TEST BLOOD SUGAR ONCE DAILY   Accu-Chek Softclix Lancets lancets USE TO TEST BLOOD GLUCOSE ONCE DAILY AS DIRECTED   acetaminophen 325 MG tablet Commonly known as: TYLENOL Take 2 tablets (650 mg total) by mouth every 6 (six) hours as needed for mild pain or moderate pain.   alfuzosin 10 MG 24 hr tablet Commonly known as: UROXATRAL TAKE 1 TABLET BY MOUTH EVERY DAY What changed: when to take this   ALPRAZolam 0.25 MG tablet Commonly known as: XANAX Take 2 tablets (0.5 mg total) by mouth 2 (two) times daily as needed for anxiety or sleep.   calcium carbonate 500 MG chewable tablet Commonly known as: TUMS - dosed in mg elemental calcium Chew 1 tablet by mouth 2 (two) times daily.   clopidogrel 75 MG tablet Commonly known as: PLAVIX TAKE 1 TABLET(75 MG) BY MOUTH DAILY What changed: See the new instructions.   cyanocobalamin 1000 MCG tablet Take 1,000 mcg by mouth daily.   diclofenac Sodium 1 % Gel Commonly known as: VOLTAREN Apply 2 g topically 4 (four) times daily. Apply to your right neck and shoulder area 3-4 times a day as needed   DULoxetine 60 MG capsule Commonly known as: CYMBALTA TAKE 1 CAPSULE(60 MG) BY MOUTH DAILY What changed: See the new instructions.   furosemide 40 MG tablet Commonly known as: LASIX Take 2 tablets (80 mg total) by mouth in the morning and at bedtime.   Insulin Pen Needle 32G X 4 MM Misc Use to inject insulin twice daily. Dx: E11.22   isosorbide mononitrate 60 MG 24 hr tablet Commonly known  as: IMDUR Take 1 tablet (60 mg total) by mouth daily.   Jardiance 25 MG Tabs tablet Generic drug: empagliflozin Take 25 mg by mouth daily.   ketoconazole 2 % cream Commonly known as: NIZORAL Apply 1 application topically as needed for irritation.   Lancet Device Misc 1 Device by Does not apply route daily.   Lantus SoloStar 100 UNIT/ML Solostar Pen Generic drug: insulin glargine Inject 50 Units into the skin at bedtime.   ondansetron 4 MG tablet Commonly known as: Zofran Take 1 tablet (4 mg total) by mouth every 8 (eight) hours as needed for nausea or vomiting.   OVER THE COUNTER MEDICATION Apply 1 Application topically every other day. Head and shoulder medicated shampoo alternate with selsun blue medicated   OXYGEN Inhale 2 L into the lungs continuous.   pantoprazole 40 MG tablet Commonly known as: PROTONIX TAKE 1 TABLET(40 MG) BY MOUTH TWICE DAILY BEFORE A MEAL  What changed: See the new instructions.   potassium chloride SA 20 MEQ tablet Commonly known as: KLOR-CON M Take 1 tablet (20 mEq total) by mouth 2 (two) times daily.   Red Yeast Rice 600 MG Caps Take 1,200 mg by mouth daily.   rOPINIRole 2 MG tablet Commonly known as: REQUIP TAKE 1 TABLET(2 MG) BY MOUTH AT BEDTIME What changed: See the new instructions.   rosuvastatin 10 MG tablet Commonly known as: CRESTOR TAKE 1 TABLET BY MOUTH EVERY DAY   simethicone 80 MG chewable tablet Commonly known as: MYLICON Chew 80 mg by mouth every 6 (six) hours as needed for flatulence.   tiZANidine 2 MG tablet Commonly known as: ZANAFLEX TAKE 1 TABLET BY MOUTH TWICE DAILY What changed: when to take this   topiramate 25 MG tablet Commonly known as: TOPAMAX TAKE 1 TABLET BY MOUTH EVERY DAY   traMADol 50 MG tablet Commonly known as: ULTRAM TAKE 1 TABLET(50 MG) BY MOUTH TWICE DAILY AS NEEDED What changed: See the new instructions.   traZODone 50 MG tablet Commonly known as: DESYREL TAKE 2 TABLETS(100 MG) BY  MOUTH AT BEDTIME What changed: See the new instructions.   triamcinolone cream 0.1 % Commonly known as: KENALOG Apply 1 application. topically 2 (two) times daily. Apply to affected areas on shin area   Victoza 18 MG/3ML Sopn Generic drug: liraglutide ADMINISTER 1.8 MG UNDER THE SKIN EVERY DAY What changed: See the new instructions.   Vitamin D3 25 MCG (1000 UT) capsule Generic drug: Cholecalciferol Take 1 capsule (1,000 Units total) by mouth daily.        Follow-up Information     [facility] Follow up.   Specialty: Home Health Services Contact information: [place] [address] [phone]         Ngetich, [name] C, NP Follow up in 1 week(s).   Specialty: Family Medicine Contact information: [place] [address] [phone]                Allergies  Allergen Reactions   Tape Rash    If left on for a long time    You were cared for by a hospitalist during your hospital stay. If you have any questions about your discharge medications or the care you received while you were in the hospital after you are discharged, you can call the unit and asked to speak with the hospitalist on call if the hospitalist that took care of you is not available. Once you are discharged, your primary care physician will handle any further medical issues. Please note that no refills for any discharge medications will be authorized once you are discharged, as it is imperative that you return to your primary care physician (or establish a relationship with a primary care physician if you do not have one) for your aftercare needs so that they can reassess your need for medications and monitor your lab values.  You were cared for by a hospitalist during your hospital stay. If you have any questions about your discharge medications or the care you received while you were in the hospital after you are discharged, you can call the unit and  asked to speak with the hospitalist on call if the hospitalist that took care of you is not available. Once you are discharged, your primary care physician will handle any further medical issues. Please note that NO REFILLS for any discharge medications will be authorized once you are  discharged, as it is imperative that you return to your primary care physician (or establish a relationship with a primary care physician if you do not have one) for your aftercare needs so that they can reassess your need for medications and monitor your lab values.  Please request your Prim.MD to go over all Hospital Tests and Procedure/Radiological results at the follow up, please get all Hospital records sent to your Prim MD by signing hospital release before you go home.  Get CBC, CMP, 2 view Chest X ray checked  by Primary MD during your next visit or SNF MD in 5-7 days ( we routinely change or add medications that can affect your baseline labs and fluid status, therefore we recommend that you get the mentioned basic workup next visit with your PCP, your PCP may decide not to get them or add new tests based on their clinical decision)  On your next visit with your primary care physician please Get Medicines reviewed and adjusted.  If you experience worsening of your admission symptoms, develop shortness of breath, life threatening emergency, suicidal or homicidal thoughts you must seek medical attention immediately by calling 911 or calling your MD immediately  if symptoms less severe.  You Must read complete instructions/literature along with all the possible adverse reactions/side effects for all the Medicines you take and that have been prescribed to you. Take any new Medicines after you have completely understood and accpet all the possible adverse reactions/side effects.   Do not drive, operate heavy machinery, perform activities at heights, swimming or participation in water activities or provide baby sitting  services if your were admitted for syncope or siezures until you have seen by Primary MD or a Neurologist and advised to do so again.  Do not drive when taking Pain medications.   Procedures/Studies: DG Abd Portable 1V  Result Date: 05/22/2022 CLINICAL DATA:  Small-bowel obstruction. EXAM: PORTABLE ABDOMEN - 1 VIEW COMPARISON:  KUB and CT abdomen/pelvis 1 day prior FINDINGS: Image quality is degraded by patient body habitus. There is gaseous distention of the stomach and small bowel in the left hemiabdomen measuring up to 5.6 cm. The appearance is grossly similar to the prior studies. There is no definite free intraperitoneal air. No enteric catheter is seen within the field of view. IMPRESSION: 1. Grossly similar gaseous distention of the stomach and small bowel consistent with small-bowel obstruction. 2. No enteric catheter is seen within the field of view. Electronically Signed   By: Valetta Mole M.D.   On: 05/22/2022 10:35   DG Abd Portable 1V  Result Date: 05/21/2022 CLINICAL DATA:  NG tube placement EXAM: PORTABLE ABDOMEN - 1 VIEW COMPARISON:  05/21/2022 FINDINGS: NG tube is within the distended stomach. IMPRESSION: NG tube in the stomach. Electronically Signed   By: Rolm Baptise M.D.   On: 05/21/2022 18:10   DG Abd Portable 1V-Small Bowel Protocol-Position Verification  Result Date: 05/21/2022 CLINICAL DATA:  NG tube placement EXAM: PORTABLE ABDOMEN - 1 VIEW COMPARISON:  Previous studies including the CT done earlier today FINDINGS: NG tube is not seen in the visualized upper abdomen and lower thorax. Bowel gas pattern is nonspecific. IMPRESSION: NG tube is not seen in the lower thorax and upper abdomen. NG tube may be coiled within the oropharynx or hypopharynx. Repositioning of NG tube should be considered. Electronically Signed   By: Elmer Picker M.D.   On: 05/21/2022 13:37   CT ABDOMEN PELVIS WO CONTRAST  Result Date: 05/21/2022 CLINICAL DATA:  72 year old male with suspected bowel  obstruction. EXAM: CT ABDOMEN AND PELVIS WITHOUT CONTRAST TECHNIQUE: Multidetector CT imaging of the abdomen and pelvis was performed following the standard protocol without IV contrast. RADIATION DOSE REDUCTION: This exam was performed according to the departmental dose-optimization program which includes automated exposure control, adjustment of the mA and/or kV according to patient size and/or use of iterative reconstruction technique. COMPARISON:  CT of the abdomen and pelvis 02/09/2022. FINDINGS: Lower chest: Small chronic left pleural effusion with apparent pleural thickening (poorly evaluated on today's noncontrast CT examination), and mass-like areas in the inferior aspect of the left lower lobe and inferior segment of the lingula, similar to prior examination 02/09/2022, presumably reflective of chronic rounded atelectasis (although underlying malignancy is difficult to entirely exclude) pacemaker leads in the right atrium and right ventricle. Hepatobiliary: No definite suspicious cystic or solid hepatic lesions are confidently identified on today's noncontrast CT examination. Unenhanced appearance of the gallbladder is unremarkable. Pancreas: No definite pancreatic mass or peripancreatic fluid collections or inflammatory changes are noted on today's noncontrast CT examination. Spleen: Unremarkable. Adrenals/Urinary Tract: Mild bilateral renal atrophy. Contour abnormality in the lower pole of the left kidney again noted where there is a slightly exophytic lesion estimated to measure approximately 3.6 cm in diameter (axial image 50 of series 4) which is slightly increased in size compared to prior study 02/09/2022, concerning for potential renal neoplasm. Mild bilateral renal atrophy. No hydroureteronephrosis. Urinary bladder is unremarkable in appearance. Bilateral adrenal glands are unremarkable in appearance. Stomach/Bowel: There is a large ventral hernia which is incompletely imaged. This contains  portions of the stomach, small bowel and colon. Multiple dilated loops of small bowel are noted within the hernia measuring up to 4 cm in diameter, several of which have small air-fluid levels within them. Other loops of small bowel within the hernia. Essentially completely decompressed as they exit, apparently loops of distal small bowel. There also appears to be a diverting right upper quadrant colostomy. Postoperative changes of partial colonic resection are noted with a suture line in the sigmoid colon. The appendix is not confidently identified and may be surgically absent. Regardless, there are no inflammatory changes noted adjacent to the cecum to suggest the presence of an acute appendicitis at this time. Vascular/Lymphatic: Atherosclerosis in the abdominal aorta and pelvic vasculature. No definite lymphadenopathy noted in the abdomen or pelvis. Reproductive: Prostate gland and seminal vesicles are unremarkable in appearance. Other: No significant volume of ascites.  No pneumoperitoneum. Musculoskeletal: There are no aggressive appearing lytic or blastic lesions noted in the visualized portions of the skeleton. IMPRESSION: 1. Massive ventral hernia containing portions of the stomach, small bowel and colon with multiple dilated loops of small bowel within the hernia which measure up to 4 cm in diameter concerning for small bowel obstruction. Distal small bowel is completely decompressed. Surgical consultation is recommended. 2. Enlarging mass-like area in the lower pole of the left kidney currently measuring 3.6 cm in diameter, poorly evaluated on today's noncontrast CT examination, but concerning for potential neoplasm. 3. Small chronic left pleural effusion with mass-like areas in the base of the left lung very similar to prior CT examination from 02/09/2022, favored to represent areas of chronic rounded atelectasis, with neoplasm not excluded, but not favored at this time. 4. Additional postoperative  changes and incidental findings, similar to prior studies, as above. Electronically Signed   By: Vinnie Langton M.D.   On: 05/21/2022 06:12     The results of significant diagnostics from  this hospitalization (including imaging, microbiology, ancillary and laboratory) are listed below for reference.     Microbiology: No results found for this or any previous visit (from the past 240 hour(s)).   Labs: BNP (last 3 results) Recent Labs    02/11/22 0209 02/12/22 0212 02/13/22 0302  BNP 55.5 51.2 [id number]   Basic Metabolic Panel: Recent Labs  Lab 05/21/22 0356 05/22/22 0755 05/23/22 0348  NA 133* 141 137  K 4.1 3.9 3.5  CL 102 109 105  CO2 22 21* 21*  GLUCOSE 263* 162* 182*  BUN 26* 31* 31*  CREATININE 1.70* 1.81* 1.91*  CALCIUM 8.5* 9.0 8.5*  MG  --   --  2.2   Liver Function Tests: Recent Labs  Lab 05/21/22 0356  AST 14*  ALT 14  ALKPHOS 80  BILITOT 0.4  PROT 6.5  ALBUMIN 3.2*   Recent Labs  Lab 05/21/22 0356  LIPASE 60*   No results for input(s): "AMMONIA" in the last 168 hours. CBC: Recent Labs  Lab 05/21/22 0356 05/22/22 0755 05/23/22 0348  WBC 11.0* 8.9 7.8  NEUTROABS 8.9*  --   --   HGB 11.0* 12.7* 11.2*  HCT 36.4* 41.0 37.3*  MCV 93.3 91.1 92.1  PLT 209 248 198   Cardiac Enzymes: No results for input(s): "CKTOTAL", "CKMB", "CKMBINDEX", "TROPONINI" in the last 168 hours. BNP: Invalid input(s): "POCBNP" CBG: Recent Labs  Lab 05/22/22 1205 05/22/22 1634 05/22/22 2004 05/23/22 0805 05/23/22 1114  GLUCAP 124* 176* 197* 191* 207*   D-Dimer No results for input(s): "DDIMER" in the last 72 hours. Hgb A1c No results for input(s): "HGBA1C" in the last 72 hours. Lipid Profile No results for input(s): "CHOL", "HDL", "LDLCALC", "TRIG", "CHOLHDL", "LDLDIRECT" in the last 72 hours. Thyroid function studies No results for input(s): "TSH", "T4TOTAL", "T3FREE", "THYROIDAB" in the last 72 hours.  Invalid input(s): "FREET3" Anemia work up No  results for input(s): "VITAMINB12", "FOLATE", "FERRITIN", "TIBC", "IRON", "RETICCTPCT" in the last 72 hours. Urinalysis    Component Value Date/Time   COLORURINE YELLOW 02/09/2022 0603   APPEARANCEUR CLEAR 02/09/2022 0603   LABSPEC 1.024 02/09/2022 0603   PHURINE 5.0 02/09/2022 0603   GLUCOSEU 150 (A) 02/09/2022 0603   HGBUR NEGATIVE 02/09/2022 0603   BILIRUBINUR NEGATIVE 02/09/2022 0603   BILIRUBINUR negative 08/25/2021 1200   KETONESUR NEGATIVE 02/09/2022 0603   PROTEINUR NEGATIVE 02/09/2022 0603   UROBILINOGEN 0.2 08/25/2021 1200   UROBILINOGEN 1.0 04/08/2008 1200   NITRITE NEGATIVE 02/09/2022 0603   LEUKOCYTESUR NEGATIVE 02/09/2022 0603   Sepsis Labs Recent Labs  Lab 05/21/22 0356 05/22/22 0755 05/23/22 0348  WBC 11.0* 8.9 7.8   Microbiology No results found for this or any previous visit (from the past 240 hour(s)).   Time coordinating discharge:  I have spent 35 minutes face to face with the patient and on the ward discussing the patients care, assessment, plan and disposition with other care givers. >50% of the time was devoted counseling the patient about the risks and benefits of treatment/Discharge disposition and coordinating care.   SIGNED:   Damita Lack, MD  [facility] 05/23/2022, 11:43 AM   If 7PM-7AM, please contact night-coverage

## 2022-05-23 NOTE — Consult Note (Signed)
   [facility] Inpatient Consult   05/23/2022  [name] [birth year] [id number]  [facility] [ACO] Patient: Jesse [name] [facility] Liaison remote coverage review for patient admitted to [facility]  Primary Care Provider:  [name], NP with [facility]   Patient screened for 2 hospitalization in 6 months noted high risk score for unplanned readmission risk and to assess for potential [facility] service needs for post hospital transition for care coordination.  Review of patient's electronic medical record reveals patient is having difficulty with home transportation for transition,  Spoke with patient via phone, HIPAA verified to explain services with [facility] which can assist with benefits and follow up for post hospital community needs.  Patient was generous and will anticipate a follow up call, likely from [facility] team.  Patient states no specific needs at this time.  "I have [facility] [HH] coming out."   Plan:   Referral request for community care coordination: none at this time, encouraged to let nurse know when calling if his needs has changed  Of note, [facility] does not replace or interfere with any arrangements made by the Inpatient Transition of Care team.  For questions contact:   Natividad Brood, RN BSN [place]  [phone] business mobile phone Toll free office [phone]  *[place]  [phone] Fax number: [phone] [email or web address]

## 2022-05-23 NOTE — Progress Notes (Signed)
Physical Therapy Treatment Patient Details Name: Jesse [name] MRN: [id number] DOB: 08-27-1950 Today's Date: 05/23/2022   History of Present Illness 72 y/o male admitted 4/1 with SBO. PMhx: typically WC bound but can walk short distances, morbid obesity, CKD III, chronic hypoxic resp failure on 2L O2, HTN, colectomy and colostomy, neuropathy, R knee surgery, PPM, dCHF, ventral hernia    PT Comments    Continuing work on functional mobility and activity tolerance;  Able to perform basic stand pivot transfer bed to recliner to simulate stand pivot transfer to power chair at home; tolerated well; Recommend non-emergent ambulance transport home for safe transition hospital to home    Recommendations for follow up therapy are one component of a multi-disciplinary discharge planning process, led by the attending physician.  Recommendations may be updated based on patient status, additional functional criteria and insurance authorization.  Follow Up Recommendations       Assistance Recommended at Discharge PRN  Patient can return home with the following A little help with walking and/or transfers;A little help with bathing/dressing/bathroom;Assistance with cooking/housework;Assist for transportation   Equipment Recommendations  None recommended by PT    Recommendations for Other Services       Precautions / Restrictions Precautions Precautions: Fall Restrictions Weight Bearing Restrictions: No     Mobility  Bed Mobility Overal bed mobility: Needs Assistance Bed Mobility: Supine to Sit     Supine to sit: Min assist     General bed mobility comments: Opted to give pt assist and keep from taxing him too much on dc day; min handheld assist to pull to sit and square off hips    Transfers Overall transfer level: Needs assistance Equipment used: None Transfers: Bed to chair/wheelchair/BSC   Stand pivot transfers: Min assist         General transfer comment: Min assist to  steady; noted decr cotrol of descent to sit, but pt states is simlar to his baseline    Ambulation/Gait               General Gait Details: States he is non ambulatory   Stairs             Wheelchair Mobility    Modified Rankin (Stroke Patients Only)       Balance     Sitting balance-Leahy Scale: Good       Standing balance-Leahy Scale: Poor                              Cognition Arousal/Alertness: Awake/alert Behavior During Therapy: WFL for tasks assessed/performed Overall Cognitive Status: Within Functional Limits for tasks assessed                                          Exercises      General Comments General comments (skin integrity, edema, etc.): Discussed with RN; plan for dc home today; recommend non emergent ambulance transport home      Pertinent Vitals/Pain Pain Assessment Pain Assessment: No/denies pain    Home Living                          Prior Function            PT Goals (current goals can now be found in the care plan section) Acute Rehab PT Goals  Patient Stated Goal: Get stronger with HHPT PT Goal Formulation: With patient Time For Goal Achievement: 06/05/22 Potential to Achieve Goals: Good Progress towards PT goals: Progressing toward goals    Frequency    Min 3X/week      PT Plan Current plan remains appropriate    Co-evaluation              AM-PAC PT "6 Clicks" Mobility   Outcome Measure  Help needed turning from your back to your side while in a flat bed without using bedrails?: None Help needed moving from lying on your back to sitting on the side of a flat bed without using bedrails?: None Help needed moving to and from a bed to a chair (including a wheelchair)?: A Little Help needed standing up from a chair using your arms (e.g., wheelchair or bedside chair)?: A Little Help needed to walk in hospital room?: Total Help needed climbing 3-5 steps with a  railing? : Total 6 Click Score: 16    End of Session Equipment Utilized During Treatment: Oxygen Activity Tolerance: Patient tolerated treatment well Patient left: in chair;with call bell/phone within reach Nurse Communication: Mobility status PT Visit Diagnosis: Unsteadiness on feet (R26.81);Muscle weakness (generalized) (M62.81);Difficulty in walking, not elsewhere classified (R26.2)     Time: [id number] PT Time Calculation (min) (ACUTE ONLY): 19 min  Charges:  $Therapeutic Activity: 8-22 mins                     Roney Marion, PT  Acute Rehabilitation Services Office [phone]    Colletta [place] 05/23/2022, 3:29 PM

## 2022-05-23 NOTE — Progress Notes (Signed)
Discharge instructions reviewed with pt.  Copy of instructions given to pt. No new scripts, no medication changes. Pt had no questions at this time. Pt will be discharging home via [facility] transportation that has been arranged by unit case manager.

## 2022-05-24 ENCOUNTER — Telehealth: Payer: Self-pay

## 2022-05-24 ENCOUNTER — Other Ambulatory Visit: Payer: Self-pay | Admitting: Family

## 2022-05-24 NOTE — Transitions of Care (Post Inpatient/ED Visit) (Signed)
   05/24/2022  Name: Jesse [name] MRN: [id number] DOB: December 01, 1950  Today's TOC FU Call Status: Today's TOC FU Call Status:: Successful TOC FU Call Competed TOC FU Call Complete Date: 05/24/22  Transition Care Management Follow-up Telephone Call Date of Discharge: 05/23/22 Discharge Facility: [facility]) Type of Discharge: Inpatient Admission Primary Inpatient Discharge Diagnosis:: "SBO" How have you been since you were released from the hospital?: Better (Pt states he rested well last night. He ate a good dinner yesterday and had good breakfast this morning. He is slowly advancing diet. "Ostomy functioning normally" per pt report.) Any questions or concerns?: No  Items Reviewed: Did you receive and understand the discharge instructions provided?: Yes Medications obtained and verified?: No (unable to review meds-pt had company arrive and had to end call-states there were no changes to his meds and he understands meds and how to take them) Any new allergies since your discharge?: No Dietary orders reviewed?: Yes Type of Diet Ordered:: low salt/heart healthy Do you have support at home?: Yes People in Home: alone  Home Care and Equipment/Supplies: Lithium Ordered?: Yes Name of [place]:: [facility]-RN,PT Has Agency set up a time to come to your home?: No (pt has contact info to call agency if he does not hear from them within 48hrs post discharge) Any new equipment or medical supplies ordered?: No  Functional Questionnaire: Do you need assistance with bathing/showering or dressing?: No Do you need assistance with meal preparation?: No Do you need assistance with eating?: No Do you have difficulty maintaining continence: No Do you need assistance with getting out of bed/getting out of a chair/moving?: No Do you have difficulty managing or taking your medications?: No  Follow up appointments reviewed: PCP Follow-up appointment confirmed?: No (pt states  he is wheelchair bound-too difficult to get to appts-prefers to have [place] draw needed post-dischage labs and send to provider-states he will contact provider to discuss) MD Provider Line Number:[phone] Given: No [facility] Follow-up appointment confirmed?: NA Do you need transportation to your follow-up appointment?: No Do you understand care options if your condition(s) worsen?: Yes-patient verbalized understanding  SDOH Interventions Today    Flowsheet Row Most Recent Value  SDOH Interventions   Food Insecurity Interventions Intervention Not Indicated  Transportation Interventions Intervention Not Indicated  [pt states he uses [facility]]      TOC Interventions Today    Flowsheet Row Most Recent Value  TOC Interventions   TOC Interventions Discussed/Reviewed TOC Interventions Discussed      Interventions Today    Flowsheet Row Most Recent Value  General Interventions   General Interventions Discussed/Reviewed General Interventions Discussed, Doctor Visits  Doctor Visits Discussed/Reviewed Doctor Visits Discussed, PCP  PCP/Specialist Visits Compliance with follow-up visit  Education Interventions   Education Provided Provided Education  Provided Verbal Education On Nutrition, When to see the doctor, Medication  Nutrition Interventions   Nutrition Discussed/Reviewed Nutrition Discussed, Adding fruits and vegetables, Decreasing salt, Fluid intake  Pharmacy Interventions   Pharmacy Dicussed/Reviewed Pharmacy Topics Discussed  Safety Interventions   Safety Discussed/Reviewed Safety Discussed       Hetty Blend [facility] Care Management Care Management Community Coordinator Direct Phone: [phone] Toll Free: [phone] Fax: [phone]

## 2022-05-25 ENCOUNTER — Telehealth: Payer: Self-pay

## 2022-05-25 DIAGNOSIS — I130 Hypertensive heart and chronic kidney disease with heart failure and stage 1 through stage 4 chronic kidney disease, or unspecified chronic kidney disease: Secondary | ICD-10-CM | POA: Diagnosis not present

## 2022-05-25 DIAGNOSIS — N2889 Other specified disorders of kidney and ureter: Secondary | ICD-10-CM | POA: Diagnosis not present

## 2022-05-25 DIAGNOSIS — N184 Chronic kidney disease, stage 4 (severe): Secondary | ICD-10-CM | POA: Diagnosis not present

## 2022-05-25 DIAGNOSIS — G4733 Obstructive sleep apnea (adult) (pediatric): Secondary | ICD-10-CM | POA: Diagnosis not present

## 2022-05-25 DIAGNOSIS — I4430 Unspecified atrioventricular block: Secondary | ICD-10-CM | POA: Diagnosis not present

## 2022-05-25 DIAGNOSIS — K56609 Unspecified intestinal obstruction, unspecified as to partial versus complete obstruction: Secondary | ICD-10-CM | POA: Diagnosis not present

## 2022-05-25 DIAGNOSIS — K436 Other and unspecified ventral hernia with obstruction, without gangrene: Secondary | ICD-10-CM | POA: Diagnosis not present

## 2022-05-25 DIAGNOSIS — E1122 Type 2 diabetes mellitus with diabetic chronic kidney disease: Secondary | ICD-10-CM | POA: Diagnosis not present

## 2022-05-25 DIAGNOSIS — I5032 Chronic diastolic (congestive) heart failure: Secondary | ICD-10-CM | POA: Diagnosis not present

## 2022-05-25 NOTE — Telephone Encounter (Signed)
Jesse [name] with [facility] called requesting verbal orders for Home health care, PT and RN services as well as to draw blood work CBC and CMP next week for hospital follow up.  Message routed to Richarda Blade, NP

## 2022-05-26 DIAGNOSIS — G4733 Obstructive sleep apnea (adult) (pediatric): Secondary | ICD-10-CM | POA: Diagnosis not present

## 2022-05-28 ENCOUNTER — Other Ambulatory Visit: Payer: Self-pay | Admitting: Family

## 2022-05-28 DIAGNOSIS — N2889 Other specified disorders of kidney and ureter: Secondary | ICD-10-CM | POA: Diagnosis not present

## 2022-05-28 DIAGNOSIS — N184 Chronic kidney disease, stage 4 (severe): Secondary | ICD-10-CM | POA: Diagnosis not present

## 2022-05-28 DIAGNOSIS — G4733 Obstructive sleep apnea (adult) (pediatric): Secondary | ICD-10-CM | POA: Diagnosis not present

## 2022-05-28 DIAGNOSIS — R110 Nausea: Secondary | ICD-10-CM

## 2022-05-28 DIAGNOSIS — E1122 Type 2 diabetes mellitus with diabetic chronic kidney disease: Secondary | ICD-10-CM | POA: Diagnosis not present

## 2022-05-28 DIAGNOSIS — K56609 Unspecified intestinal obstruction, unspecified as to partial versus complete obstruction: Secondary | ICD-10-CM | POA: Diagnosis not present

## 2022-05-28 DIAGNOSIS — I130 Hypertensive heart and chronic kidney disease with heart failure and stage 1 through stage 4 chronic kidney disease, or unspecified chronic kidney disease: Secondary | ICD-10-CM | POA: Diagnosis not present

## 2022-05-28 DIAGNOSIS — I4430 Unspecified atrioventricular block: Secondary | ICD-10-CM | POA: Diagnosis not present

## 2022-05-28 DIAGNOSIS — K436 Other and unspecified ventral hernia with obstruction, without gangrene: Secondary | ICD-10-CM | POA: Diagnosis not present

## 2022-05-28 DIAGNOSIS — I5032 Chronic diastolic (congestive) heart failure: Secondary | ICD-10-CM | POA: Diagnosis not present

## 2022-05-28 MED ORDER — ONDANSETRON HCL 4 MG PO TABS
4.0000 mg | ORAL_TABLET | Freq: Three times a day (TID) | ORAL | 0 refills | Status: DC | PRN
Start: 2022-05-28 — End: 2022-10-03

## 2022-05-28 NOTE — Progress Notes (Signed)
Zofran prescription refilled

## 2022-05-28 NOTE — Telephone Encounter (Signed)
Called [facility] and verbal orders were given.

## 2022-05-28 NOTE — Telephone Encounter (Signed)
Noted  

## 2022-05-30 ENCOUNTER — Telehealth: Payer: Self-pay

## 2022-05-30 NOTE — Telephone Encounter (Signed)
Angela with [facility] called to get a more specific diagnosis other than Bowel obstruction unspecified as a focus area to qualify patient for services. Marylene Land stated they could use the diabetes diagnosis and possibly small bowel obstruction with ventral hernia as listed in the hospital encounter.  I provided Marylene Land with both diagnosis codes and she stated that if those codes (E11.69 and K43.6) are not suffice then she will call back.   Message sent to [place], [name], NP as a FYI.

## 2022-05-30 NOTE — Telephone Encounter (Signed)
Noted  

## 2022-05-31 DIAGNOSIS — G4733 Obstructive sleep apnea (adult) (pediatric): Secondary | ICD-10-CM | POA: Diagnosis not present

## 2022-05-31 DIAGNOSIS — E1122 Type 2 diabetes mellitus with diabetic chronic kidney disease: Secondary | ICD-10-CM | POA: Diagnosis not present

## 2022-05-31 DIAGNOSIS — I130 Hypertensive heart and chronic kidney disease with heart failure and stage 1 through stage 4 chronic kidney disease, or unspecified chronic kidney disease: Secondary | ICD-10-CM | POA: Diagnosis not present

## 2022-05-31 DIAGNOSIS — I4430 Unspecified atrioventricular block: Secondary | ICD-10-CM | POA: Diagnosis not present

## 2022-05-31 DIAGNOSIS — N2889 Other specified disorders of kidney and ureter: Secondary | ICD-10-CM | POA: Diagnosis not present

## 2022-05-31 DIAGNOSIS — I5032 Chronic diastolic (congestive) heart failure: Secondary | ICD-10-CM | POA: Diagnosis not present

## 2022-05-31 DIAGNOSIS — K436 Other and unspecified ventral hernia with obstruction, without gangrene: Secondary | ICD-10-CM | POA: Diagnosis not present

## 2022-05-31 DIAGNOSIS — K56609 Unspecified intestinal obstruction, unspecified as to partial versus complete obstruction: Secondary | ICD-10-CM | POA: Diagnosis not present

## 2022-05-31 DIAGNOSIS — N184 Chronic kidney disease, stage 4 (severe): Secondary | ICD-10-CM | POA: Diagnosis not present

## 2022-06-05 DIAGNOSIS — I5032 Chronic diastolic (congestive) heart failure: Secondary | ICD-10-CM | POA: Diagnosis not present

## 2022-06-05 DIAGNOSIS — N2889 Other specified disorders of kidney and ureter: Secondary | ICD-10-CM | POA: Diagnosis not present

## 2022-06-05 DIAGNOSIS — I130 Hypertensive heart and chronic kidney disease with heart failure and stage 1 through stage 4 chronic kidney disease, or unspecified chronic kidney disease: Secondary | ICD-10-CM | POA: Diagnosis not present

## 2022-06-05 DIAGNOSIS — E1122 Type 2 diabetes mellitus with diabetic chronic kidney disease: Secondary | ICD-10-CM | POA: Diagnosis not present

## 2022-06-05 DIAGNOSIS — Z794 Long term (current) use of insulin: Secondary | ICD-10-CM | POA: Diagnosis not present

## 2022-06-05 DIAGNOSIS — I4430 Unspecified atrioventricular block: Secondary | ICD-10-CM | POA: Diagnosis not present

## 2022-06-05 DIAGNOSIS — Z933 Colostomy status: Secondary | ICD-10-CM | POA: Diagnosis not present

## 2022-06-05 DIAGNOSIS — E118 Type 2 diabetes mellitus with unspecified complications: Secondary | ICD-10-CM | POA: Diagnosis not present

## 2022-06-05 DIAGNOSIS — E119 Type 2 diabetes mellitus without complications: Secondary | ICD-10-CM | POA: Diagnosis not present

## 2022-06-05 DIAGNOSIS — G4733 Obstructive sleep apnea (adult) (pediatric): Secondary | ICD-10-CM | POA: Diagnosis not present

## 2022-06-05 DIAGNOSIS — K436 Other and unspecified ventral hernia with obstruction, without gangrene: Secondary | ICD-10-CM | POA: Diagnosis not present

## 2022-06-05 DIAGNOSIS — N184 Chronic kidney disease, stage 4 (severe): Secondary | ICD-10-CM | POA: Diagnosis not present

## 2022-06-05 DIAGNOSIS — K56609 Unspecified intestinal obstruction, unspecified as to partial versus complete obstruction: Secondary | ICD-10-CM | POA: Diagnosis not present

## 2022-06-06 DIAGNOSIS — I4430 Unspecified atrioventricular block: Secondary | ICD-10-CM | POA: Diagnosis not present

## 2022-06-06 DIAGNOSIS — I130 Hypertensive heart and chronic kidney disease with heart failure and stage 1 through stage 4 chronic kidney disease, or unspecified chronic kidney disease: Secondary | ICD-10-CM | POA: Diagnosis not present

## 2022-06-06 DIAGNOSIS — K436 Other and unspecified ventral hernia with obstruction, without gangrene: Secondary | ICD-10-CM | POA: Diagnosis not present

## 2022-06-06 DIAGNOSIS — E1122 Type 2 diabetes mellitus with diabetic chronic kidney disease: Secondary | ICD-10-CM | POA: Diagnosis not present

## 2022-06-06 DIAGNOSIS — N2889 Other specified disorders of kidney and ureter: Secondary | ICD-10-CM | POA: Diagnosis not present

## 2022-06-06 DIAGNOSIS — G4733 Obstructive sleep apnea (adult) (pediatric): Secondary | ICD-10-CM | POA: Diagnosis not present

## 2022-06-06 DIAGNOSIS — I5032 Chronic diastolic (congestive) heart failure: Secondary | ICD-10-CM | POA: Diagnosis not present

## 2022-06-06 DIAGNOSIS — Z7902 Long term (current) use of antithrombotics/antiplatelets: Secondary | ICD-10-CM | POA: Diagnosis not present

## 2022-06-06 DIAGNOSIS — N184 Chronic kidney disease, stage 4 (severe): Secondary | ICD-10-CM | POA: Diagnosis not present

## 2022-06-06 DIAGNOSIS — K56609 Unspecified intestinal obstruction, unspecified as to partial versus complete obstruction: Secondary | ICD-10-CM | POA: Diagnosis not present

## 2022-06-06 LAB — CBC: RBC: 4.22 (ref 3.87–5.11)

## 2022-06-06 LAB — HEPATIC FUNCTION PANEL
ALT: 11 U/L (ref 10–40)
AST: 13 — AB (ref 14–40)
Alkaline Phosphatase: 96 (ref 25–125)
Bilirubin, Total: 0.3

## 2022-06-06 LAB — COMPREHENSIVE METABOLIC PANEL
Albumin: 4 (ref 3.5–5.0)
Calcium: 8.6 — AB (ref 8.7–10.7)
Globulin: 2.4
eGFR: 33

## 2022-06-06 LAB — BASIC METABOLIC PANEL
BUN: 35 — AB (ref 4–21)
CO2: 26 — AB (ref 13–22)
Chloride: 95 — AB (ref 99–108)
Creatinine: 2.1 — AB (ref 0.6–1.3)
Glucose: 214
Potassium: 4.1 mEq/L (ref 3.5–5.1)
Sodium: 137 (ref 137–147)

## 2022-06-06 LAB — CBC AND DIFFERENTIAL
HCT: 38 — AB (ref 41–53)
Hemoglobin: 11.6 — AB (ref 13.5–17.5)
Neutrophils Absolute: 74
Platelets: 275 10*3/uL (ref 150–400)
WBC: 7.8

## 2022-06-07 DIAGNOSIS — N2889 Other specified disorders of kidney and ureter: Secondary | ICD-10-CM | POA: Diagnosis not present

## 2022-06-07 DIAGNOSIS — Z794 Long term (current) use of insulin: Secondary | ICD-10-CM | POA: Diagnosis not present

## 2022-06-07 DIAGNOSIS — E1122 Type 2 diabetes mellitus with diabetic chronic kidney disease: Secondary | ICD-10-CM | POA: Diagnosis not present

## 2022-06-07 DIAGNOSIS — I4430 Unspecified atrioventricular block: Secondary | ICD-10-CM | POA: Diagnosis not present

## 2022-06-07 DIAGNOSIS — I130 Hypertensive heart and chronic kidney disease with heart failure and stage 1 through stage 4 chronic kidney disease, or unspecified chronic kidney disease: Secondary | ICD-10-CM | POA: Diagnosis not present

## 2022-06-07 DIAGNOSIS — I5032 Chronic diastolic (congestive) heart failure: Secondary | ICD-10-CM | POA: Diagnosis not present

## 2022-06-07 DIAGNOSIS — K436 Other and unspecified ventral hernia with obstruction, without gangrene: Secondary | ICD-10-CM | POA: Diagnosis not present

## 2022-06-07 DIAGNOSIS — Z933 Colostomy status: Secondary | ICD-10-CM | POA: Diagnosis not present

## 2022-06-07 DIAGNOSIS — N184 Chronic kidney disease, stage 4 (severe): Secondary | ICD-10-CM | POA: Diagnosis not present

## 2022-06-07 DIAGNOSIS — K56609 Unspecified intestinal obstruction, unspecified as to partial versus complete obstruction: Secondary | ICD-10-CM | POA: Diagnosis not present

## 2022-06-07 DIAGNOSIS — E119 Type 2 diabetes mellitus without complications: Secondary | ICD-10-CM | POA: Diagnosis not present

## 2022-06-07 DIAGNOSIS — E118 Type 2 diabetes mellitus with unspecified complications: Secondary | ICD-10-CM | POA: Diagnosis not present

## 2022-06-07 DIAGNOSIS — G4733 Obstructive sleep apnea (adult) (pediatric): Secondary | ICD-10-CM | POA: Diagnosis not present

## 2022-06-10 ENCOUNTER — Other Ambulatory Visit: Payer: Self-pay | Admitting: Family

## 2022-06-10 DIAGNOSIS — F411 Generalized anxiety disorder: Secondary | ICD-10-CM

## 2022-06-12 DIAGNOSIS — K436 Other and unspecified ventral hernia with obstruction, without gangrene: Secondary | ICD-10-CM | POA: Diagnosis not present

## 2022-06-12 DIAGNOSIS — I5032 Chronic diastolic (congestive) heart failure: Secondary | ICD-10-CM | POA: Diagnosis not present

## 2022-06-12 DIAGNOSIS — I4430 Unspecified atrioventricular block: Secondary | ICD-10-CM | POA: Diagnosis not present

## 2022-06-12 DIAGNOSIS — K56609 Unspecified intestinal obstruction, unspecified as to partial versus complete obstruction: Secondary | ICD-10-CM | POA: Diagnosis not present

## 2022-06-12 DIAGNOSIS — N184 Chronic kidney disease, stage 4 (severe): Secondary | ICD-10-CM | POA: Diagnosis not present

## 2022-06-12 DIAGNOSIS — N2889 Other specified disorders of kidney and ureter: Secondary | ICD-10-CM | POA: Diagnosis not present

## 2022-06-12 DIAGNOSIS — E1122 Type 2 diabetes mellitus with diabetic chronic kidney disease: Secondary | ICD-10-CM | POA: Diagnosis not present

## 2022-06-12 DIAGNOSIS — G4733 Obstructive sleep apnea (adult) (pediatric): Secondary | ICD-10-CM | POA: Diagnosis not present

## 2022-06-12 DIAGNOSIS — I130 Hypertensive heart and chronic kidney disease with heart failure and stage 1 through stage 4 chronic kidney disease, or unspecified chronic kidney disease: Secondary | ICD-10-CM | POA: Diagnosis not present

## 2022-06-13 DIAGNOSIS — K56609 Unspecified intestinal obstruction, unspecified as to partial versus complete obstruction: Secondary | ICD-10-CM | POA: Diagnosis not present

## 2022-06-13 DIAGNOSIS — N2889 Other specified disorders of kidney and ureter: Secondary | ICD-10-CM | POA: Diagnosis not present

## 2022-06-13 DIAGNOSIS — G4733 Obstructive sleep apnea (adult) (pediatric): Secondary | ICD-10-CM | POA: Diagnosis not present

## 2022-06-13 DIAGNOSIS — I5032 Chronic diastolic (congestive) heart failure: Secondary | ICD-10-CM | POA: Diagnosis not present

## 2022-06-13 DIAGNOSIS — K436 Other and unspecified ventral hernia with obstruction, without gangrene: Secondary | ICD-10-CM | POA: Diagnosis not present

## 2022-06-13 DIAGNOSIS — E1122 Type 2 diabetes mellitus with diabetic chronic kidney disease: Secondary | ICD-10-CM | POA: Diagnosis not present

## 2022-06-13 DIAGNOSIS — N184 Chronic kidney disease, stage 4 (severe): Secondary | ICD-10-CM | POA: Diagnosis not present

## 2022-06-13 DIAGNOSIS — I4430 Unspecified atrioventricular block: Secondary | ICD-10-CM | POA: Diagnosis not present

## 2022-06-13 DIAGNOSIS — I130 Hypertensive heart and chronic kidney disease with heart failure and stage 1 through stage 4 chronic kidney disease, or unspecified chronic kidney disease: Secondary | ICD-10-CM | POA: Diagnosis not present

## 2022-06-14 DIAGNOSIS — N184 Chronic kidney disease, stage 4 (severe): Secondary | ICD-10-CM | POA: Diagnosis not present

## 2022-06-14 DIAGNOSIS — I4430 Unspecified atrioventricular block: Secondary | ICD-10-CM | POA: Diagnosis not present

## 2022-06-14 DIAGNOSIS — E1122 Type 2 diabetes mellitus with diabetic chronic kidney disease: Secondary | ICD-10-CM | POA: Diagnosis not present

## 2022-06-14 DIAGNOSIS — N2889 Other specified disorders of kidney and ureter: Secondary | ICD-10-CM | POA: Diagnosis not present

## 2022-06-14 DIAGNOSIS — I5032 Chronic diastolic (congestive) heart failure: Secondary | ICD-10-CM | POA: Diagnosis not present

## 2022-06-14 DIAGNOSIS — I130 Hypertensive heart and chronic kidney disease with heart failure and stage 1 through stage 4 chronic kidney disease, or unspecified chronic kidney disease: Secondary | ICD-10-CM | POA: Diagnosis not present

## 2022-06-14 DIAGNOSIS — K56609 Unspecified intestinal obstruction, unspecified as to partial versus complete obstruction: Secondary | ICD-10-CM | POA: Diagnosis not present

## 2022-06-14 DIAGNOSIS — G4733 Obstructive sleep apnea (adult) (pediatric): Secondary | ICD-10-CM | POA: Diagnosis not present

## 2022-06-14 DIAGNOSIS — K436 Other and unspecified ventral hernia with obstruction, without gangrene: Secondary | ICD-10-CM | POA: Diagnosis not present

## 2022-06-15 ENCOUNTER — Ambulatory Visit (INDEPENDENT_AMBULATORY_CARE_PROVIDER_SITE_OTHER): Payer: Medicare PPO

## 2022-06-15 DIAGNOSIS — I442 Atrioventricular block, complete: Secondary | ICD-10-CM

## 2022-06-15 DIAGNOSIS — E119 Type 2 diabetes mellitus without complications: Secondary | ICD-10-CM | POA: Diagnosis not present

## 2022-06-15 DIAGNOSIS — Z933 Colostomy status: Secondary | ICD-10-CM | POA: Diagnosis not present

## 2022-06-15 DIAGNOSIS — Z794 Long term (current) use of insulin: Secondary | ICD-10-CM | POA: Diagnosis not present

## 2022-06-15 DIAGNOSIS — K56609 Unspecified intestinal obstruction, unspecified as to partial versus complete obstruction: Secondary | ICD-10-CM | POA: Diagnosis not present

## 2022-06-15 DIAGNOSIS — E118 Type 2 diabetes mellitus with unspecified complications: Secondary | ICD-10-CM | POA: Diagnosis not present

## 2022-06-15 LAB — CUP PACEART REMOTE DEVICE CHECK
Battery Remaining Longevity: 90 mo
Battery Remaining Percentage: 93 %
Battery Voltage: 2.99 V
Brady Statistic AP VP Percent: 49 %
Brady Statistic AP VS Percent: 1 %
Brady Statistic AS VP Percent: 50 %
Brady Statistic AS VS Percent: 1 %
Brady Statistic RA Percent Paced: 48 %
Brady Statistic RV Percent Paced: 99 %
Date Time Interrogation Session: 20240426034025
Implantable Lead Connection Status: 753985
Implantable Lead Connection Status: 753985
Implantable Lead Implant Date: 20230728
Implantable Lead Implant Date: 20230728
Implantable Lead Location: 753859
Implantable Lead Location: 753860
Implantable Lead Model: 3830
Implantable Pulse Generator Implant Date: 20230728
Lead Channel Impedance Value: 450 Ohm
Lead Channel Impedance Value: 510 Ohm
Lead Channel Pacing Threshold Amplitude: 1 V
Lead Channel Pacing Threshold Amplitude: 1.25 V
Lead Channel Pacing Threshold Pulse Width: 0.5 ms
Lead Channel Pacing Threshold Pulse Width: 0.5 ms
Lead Channel Sensing Intrinsic Amplitude: 2.4 mV
Lead Channel Sensing Intrinsic Amplitude: 8 mV
Lead Channel Setting Pacing Amplitude: 2.25 V
Lead Channel Setting Pacing Amplitude: 2.5 V
Lead Channel Setting Pacing Pulse Width: 0.5 ms
Lead Channel Setting Sensing Sensitivity: 2.5 mV
Pulse Gen Model: 2272
Pulse Gen Serial Number: 8101667

## 2022-06-16 ENCOUNTER — Other Ambulatory Visit: Payer: Self-pay | Admitting: Family

## 2022-06-18 DIAGNOSIS — K436 Other and unspecified ventral hernia with obstruction, without gangrene: Secondary | ICD-10-CM | POA: Diagnosis not present

## 2022-06-18 DIAGNOSIS — N2889 Other specified disorders of kidney and ureter: Secondary | ICD-10-CM | POA: Diagnosis not present

## 2022-06-18 DIAGNOSIS — L244 Irritant contact dermatitis due to drugs in contact with skin: Secondary | ICD-10-CM | POA: Diagnosis not present

## 2022-06-18 DIAGNOSIS — E1122 Type 2 diabetes mellitus with diabetic chronic kidney disease: Secondary | ICD-10-CM | POA: Diagnosis not present

## 2022-06-18 DIAGNOSIS — Z08 Encounter for follow-up examination after completed treatment for malignant neoplasm: Secondary | ICD-10-CM | POA: Diagnosis not present

## 2022-06-18 DIAGNOSIS — K56609 Unspecified intestinal obstruction, unspecified as to partial versus complete obstruction: Secondary | ICD-10-CM | POA: Diagnosis not present

## 2022-06-18 DIAGNOSIS — N184 Chronic kidney disease, stage 4 (severe): Secondary | ICD-10-CM | POA: Diagnosis not present

## 2022-06-18 DIAGNOSIS — I130 Hypertensive heart and chronic kidney disease with heart failure and stage 1 through stage 4 chronic kidney disease, or unspecified chronic kidney disease: Secondary | ICD-10-CM | POA: Diagnosis not present

## 2022-06-18 DIAGNOSIS — G4733 Obstructive sleep apnea (adult) (pediatric): Secondary | ICD-10-CM | POA: Diagnosis not present

## 2022-06-18 DIAGNOSIS — Z86007 Personal history of in-situ neoplasm of skin: Secondary | ICD-10-CM | POA: Diagnosis not present

## 2022-06-18 DIAGNOSIS — I5032 Chronic diastolic (congestive) heart failure: Secondary | ICD-10-CM | POA: Diagnosis not present

## 2022-06-18 DIAGNOSIS — I4430 Unspecified atrioventricular block: Secondary | ICD-10-CM | POA: Diagnosis not present

## 2022-06-19 DIAGNOSIS — G4733 Obstructive sleep apnea (adult) (pediatric): Secondary | ICD-10-CM | POA: Diagnosis not present

## 2022-06-19 DIAGNOSIS — I5032 Chronic diastolic (congestive) heart failure: Secondary | ICD-10-CM | POA: Diagnosis not present

## 2022-06-19 DIAGNOSIS — K56609 Unspecified intestinal obstruction, unspecified as to partial versus complete obstruction: Secondary | ICD-10-CM | POA: Diagnosis not present

## 2022-06-19 DIAGNOSIS — N184 Chronic kidney disease, stage 4 (severe): Secondary | ICD-10-CM | POA: Diagnosis not present

## 2022-06-19 DIAGNOSIS — E1122 Type 2 diabetes mellitus with diabetic chronic kidney disease: Secondary | ICD-10-CM | POA: Diagnosis not present

## 2022-06-19 DIAGNOSIS — I130 Hypertensive heart and chronic kidney disease with heart failure and stage 1 through stage 4 chronic kidney disease, or unspecified chronic kidney disease: Secondary | ICD-10-CM | POA: Diagnosis not present

## 2022-06-19 DIAGNOSIS — I4430 Unspecified atrioventricular block: Secondary | ICD-10-CM | POA: Diagnosis not present

## 2022-06-19 DIAGNOSIS — N2889 Other specified disorders of kidney and ureter: Secondary | ICD-10-CM | POA: Diagnosis not present

## 2022-06-19 DIAGNOSIS — K436 Other and unspecified ventral hernia with obstruction, without gangrene: Secondary | ICD-10-CM | POA: Diagnosis not present

## 2022-06-21 DIAGNOSIS — H25813 Combined forms of age-related cataract, bilateral: Secondary | ICD-10-CM | POA: Diagnosis not present

## 2022-06-21 DIAGNOSIS — E113293 Type 2 diabetes mellitus with mild nonproliferative diabetic retinopathy without macular edema, bilateral: Secondary | ICD-10-CM | POA: Diagnosis not present

## 2022-06-21 LAB — HM DIABETES EYE EXAM

## 2022-06-25 DIAGNOSIS — G4733 Obstructive sleep apnea (adult) (pediatric): Secondary | ICD-10-CM | POA: Diagnosis not present

## 2022-06-26 DIAGNOSIS — N184 Chronic kidney disease, stage 4 (severe): Secondary | ICD-10-CM | POA: Diagnosis not present

## 2022-06-26 DIAGNOSIS — E118 Type 2 diabetes mellitus with unspecified complications: Secondary | ICD-10-CM | POA: Diagnosis not present

## 2022-06-26 DIAGNOSIS — G4733 Obstructive sleep apnea (adult) (pediatric): Secondary | ICD-10-CM | POA: Diagnosis not present

## 2022-06-26 DIAGNOSIS — N2889 Other specified disorders of kidney and ureter: Secondary | ICD-10-CM | POA: Diagnosis not present

## 2022-06-26 DIAGNOSIS — Z794 Long term (current) use of insulin: Secondary | ICD-10-CM | POA: Diagnosis not present

## 2022-06-26 DIAGNOSIS — I5032 Chronic diastolic (congestive) heart failure: Secondary | ICD-10-CM | POA: Diagnosis not present

## 2022-06-26 DIAGNOSIS — I4430 Unspecified atrioventricular block: Secondary | ICD-10-CM | POA: Diagnosis not present

## 2022-06-26 DIAGNOSIS — I130 Hypertensive heart and chronic kidney disease with heart failure and stage 1 through stage 4 chronic kidney disease, or unspecified chronic kidney disease: Secondary | ICD-10-CM | POA: Diagnosis not present

## 2022-06-26 DIAGNOSIS — E119 Type 2 diabetes mellitus without complications: Secondary | ICD-10-CM | POA: Diagnosis not present

## 2022-06-26 DIAGNOSIS — Z933 Colostomy status: Secondary | ICD-10-CM | POA: Diagnosis not present

## 2022-06-26 DIAGNOSIS — K56609 Unspecified intestinal obstruction, unspecified as to partial versus complete obstruction: Secondary | ICD-10-CM | POA: Diagnosis not present

## 2022-06-26 DIAGNOSIS — K436 Other and unspecified ventral hernia with obstruction, without gangrene: Secondary | ICD-10-CM | POA: Diagnosis not present

## 2022-06-26 DIAGNOSIS — E1122 Type 2 diabetes mellitus with diabetic chronic kidney disease: Secondary | ICD-10-CM | POA: Diagnosis not present

## 2022-06-28 ENCOUNTER — Ambulatory Visit (INDEPENDENT_AMBULATORY_CARE_PROVIDER_SITE_OTHER): Payer: Medicare PPO | Admitting: Family

## 2022-06-28 ENCOUNTER — Ambulatory Visit: Payer: Medicare PPO | Admitting: Family

## 2022-06-28 ENCOUNTER — Encounter: Payer: Self-pay | Admitting: Family

## 2022-06-28 VITALS — BP 122/60 | HR 79 | Temp 97.8°F | Resp 20

## 2022-06-28 DIAGNOSIS — Z23 Encounter for immunization: Secondary | ICD-10-CM

## 2022-06-28 DIAGNOSIS — E119 Type 2 diabetes mellitus without complications: Secondary | ICD-10-CM | POA: Diagnosis not present

## 2022-06-28 DIAGNOSIS — Z0000 Encounter for general adult medical examination without abnormal findings: Secondary | ICD-10-CM

## 2022-06-28 DIAGNOSIS — Z933 Colostomy status: Secondary | ICD-10-CM | POA: Diagnosis not present

## 2022-06-28 DIAGNOSIS — E118 Type 2 diabetes mellitus with unspecified complications: Secondary | ICD-10-CM | POA: Diagnosis not present

## 2022-06-28 DIAGNOSIS — K56609 Unspecified intestinal obstruction, unspecified as to partial versus complete obstruction: Secondary | ICD-10-CM | POA: Diagnosis not present

## 2022-06-28 DIAGNOSIS — Z794 Long term (current) use of insulin: Secondary | ICD-10-CM | POA: Diagnosis not present

## 2022-06-28 NOTE — Telephone Encounter (Signed)
FYI: too early to refill Alprazolam last filled 06/11/2022.

## 2022-06-28 NOTE — Telephone Encounter (Signed)
Spoke to pharmacist at [facility] who said she would fax over the vaccination record for Mr. Tarpey including the 2nd Shingles vaccine that they administered.

## 2022-06-28 NOTE — Progress Notes (Signed)
Subjective:   Jesse [name] is a 72 y.o. male who presents for Medicare Annual/Subsequent preventive examination.  Review of Systems     Cardiac Risk Factors include: advanced age (>50men, >57 women);obesity (BMI >30kg/m2);diabetes mellitus;dyslipidemia;male gender;smoking/ tobacco exposure     Objective:    Today's Vitals   06/28/22 1127 06/28/22 1154  BP: 122/60   Pulse: 79   Resp: 20   Temp: 97.8 F (36.6 C)   SpO2: 93%   PainSc:  2    There is no height or weight on file to calculate BMI.     06/28/2022   11:20 AM 05/22/2022    4:01 AM 05/21/2022    3:35 AM 02/20/2022    1:51 PM 02/09/2022   12:04 AM 01/15/2022    3:23 PM 12/22/2021    9:34 AM  Advanced Directives  Does Patient Have a Medical Advance Directive? Yes  No Yes Yes Yes Yes  Type of Advance Directive Out of facility DNR (pink MOST or yellow form)   Healthcare Power of [place];Living will Healthcare Power of [place];Living will Living will Living will  Does patient want to make changes to medical advance directive? No - Patient declined   No - Patient declined No - Patient declined No - Patient declined No - Patient declined  Copy of Healthcare Power of Attorney in Chart?    Yes - validated most recent copy scanned in chart (See row information)     Would patient like information on creating a medical advance directive?  No - Patient declined       Pre-existing out of facility DNR order (yellow form or pink MOST form) Pink MOST form placed in chart (order not valid for inpatient use)          Current Medications (verified) Outpatient Encounter Medications as of 06/28/2022  Medication Sig   Accu-Chek Softclix Lancets lancets USE TO TEST BLOOD GLUCOSE ONCE DAILY AS DIRECTED   acetaminophen (TYLENOL) 325 MG tablet Take 2 tablets (650 mg total) by mouth every 6 (six) hours as needed for mild pain or moderate pain.   alfuzosin (UROXATRAL) 10 MG 24 hr tablet TAKE 1 TABLET BY MOUTH EVERY DAY (Patient taking  differently: Take 10 mg by mouth daily with breakfast.)   ALPRAZolam (XANAX) 0.5 MG tablet TAKE 1 TABLET(0.5 MG) BY MOUTH TWICE DAILY AS NEEDED FOR ANXIETY OR SLEEP   calcium carbonate (TUMS - DOSED IN MG ELEMENTAL CALCIUM) 500 MG chewable tablet Chew 1 tablet by mouth 2 (two) times daily.   Cholecalciferol (VITAMIN D3) 25 MCG (1000 UT) capsule Take 1 capsule (1,000 Units total) by mouth daily.   clopidogrel (PLAVIX) 75 MG tablet TAKE 1 TABLET(75 MG) BY MOUTH DAILY   cyanocobalamin 1000 MCG tablet Take 1,000 mcg by mouth daily.   diclofenac Sodium (VOLTAREN) 1 % GEL Apply 2 g topically 4 (four) times daily. Apply to your right neck and shoulder area 3-4 times a day as needed   DULoxetine (CYMBALTA) 60 MG capsule TAKE 1 CAPSULE(60 MG) BY MOUTH DAILY (Patient taking differently: Take 60 mg by mouth daily.)   furosemide (LASIX) 40 MG tablet Take 2 tablets (80 mg total) by mouth in the morning and at bedtime.   glucose blood (ACCU-CHEK GUIDE) test strip USE TO TEST BLOOD SUGAR ONCE DAILY   insulin glargine (LANTUS SOLOSTAR) 100 UNIT/ML Solostar Pen Inject 50 Units into the skin at bedtime.   Insulin Pen Needle 32G X 4 MM MISC Use to inject insulin twice  daily. Dx: E11.22   isosorbide mononitrate (IMDUR) 60 MG 24 hr tablet Take 1 tablet (60 mg total) by mouth daily.   JARDIANCE 25 MG TABS tablet TAKE 1 TABLET BY MOUTH EVERY DAY   ketoconazole (NIZORAL) 2 % cream Apply 1 application topically as needed for irritation.   Lancet Device MISC 1 Device by Does not apply route daily.   ondansetron (ZOFRAN) 4 MG tablet Take 1 tablet (4 mg total) by mouth every 8 (eight) hours as needed for nausea or vomiting.   OVER THE COUNTER MEDICATION Apply 1 Application topically every other day. Head and shoulder medicated shampoo alternate with selsun blue medicated   OXYGEN Inhale 2 L into the lungs continuous.   pantoprazole (PROTONIX) 40 MG tablet TAKE 1 TABLET(40 MG) BY MOUTH TWICE DAILY BEFORE A MEAL (Patient  taking differently: Take 40 mg by mouth 2 (two) times daily.)   potassium chloride SA (KLOR-CON M) 20 MEQ tablet Take 1 tablet (20 mEq total) by mouth 2 (two) times daily.   Red Yeast Rice 600 MG CAPS Take 1,200 mg by mouth daily.   rOPINIRole (REQUIP) 2 MG tablet TAKE 1 TABLET(2 MG) BY MOUTH AT BEDTIME (Patient taking differently: Take 2 mg by mouth at bedtime.)   rosuvastatin (CRESTOR) 10 MG tablet TAKE 1 TABLET BY MOUTH EVERY DAY   simethicone (MYLICON) 80 MG chewable tablet Chew 80 mg by mouth every 6 (six) hours as needed for flatulence.   tiZANidine (ZANAFLEX) 2 MG tablet TAKE 1 TABLET BY MOUTH TWICE DAILY (Patient taking differently: Take 2 mg by mouth daily.)   topiramate (TOPAMAX) 25 MG tablet TAKE 1 TABLET BY MOUTH EVERY DAY (Patient taking differently: Take 25 mg by mouth daily.)   traMADol (ULTRAM) 50 MG tablet TAKE 1 TABLET(50 MG) BY MOUTH TWICE DAILY AS NEEDED (Patient taking differently: 50 mg 2 (two) times daily as needed for moderate pain.)   traZODone (DESYREL) 50 MG tablet TAKE 2 TABLETS(100 MG) BY MOUTH AT BEDTIME (Patient taking differently: Take 100 mg by mouth at bedtime.)   triamcinolone cream (KENALOG) 0.1 % Apply 1 application. topically 2 (two) times daily. Apply to affected areas on shin area   VICTOZA 18 MG/3ML SOPN ADMINISTER 1.8 MG UNDER THE SKIN EVERY DAY (Patient taking differently: Inject 1.8 mg into the skin daily.)   No facility-administered encounter medications on file as of 06/28/2022.    Allergies (verified) Tape   History: Past Medical History:  Diagnosis Date   Alcohol use disorder in remission 1999   Anxiety and depression    At high risk for falls    Former cigarette smoker 1999   Generalized anxiety disorder    Hard of hearing    Hyperlipidemia due to type 2 diabetes mellitus (HCC)    Hypertension    Morbid obesity (HCC)    Neuropathy    O2 dependent    Risk for falls    Sleep apnea    Type 2 diabetes mellitus (HCC)    Ventral hernia with  bowel obstruction    Per Hospital Encounter on 05/21/22   Past Surgical History:  Procedure Laterality Date   APPENDECTOMY  1980   IR THORACENTESIS ASP PLEURAL SPACE W/IMG GUIDE  05/18/2021   IR THORACENTESIS ASP PLEURAL SPACE W/IMG GUIDE  10/06/2021   KNEE SURGERY Right    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   Procedure: PACEMAKER IMPLANT;  Surgeon: Duke Salvia, MD;  Location: [facility] INVASIVE CV LAB;  Service: Cardiovascular;  Laterality: N/A;  Family History  Problem Relation Age of Onset   Diabetes Brother    Bipolar disorder Daughter    Anxiety disorder Son    Social History   Socioeconomic History   Marital status: Divorced    Spouse name: Not on file   Number of children: 3   Years of education: Not on file   Highest education level: Master's degree (e.g., MA, MS, MEng, MEd, MSW, MBA)  Occupational History   Occupation: Retired  Tobacco Use   Smoking status: Former    Packs/day: 1    Types: Cigarettes    Quit date: 1999    Years since quitting: 25.3   Smokeless tobacco: Never  Vaping Use   Vaping Use: Never used  Substance and Sexual Activity   Alcohol use: Not Currently   Drug use: Never   Sexual activity: Not on file  Other Topics Concern   Not on file  Social History Narrative   Tobacco use, amount per day now: 0   Past tobacco use, amount per day: 1 pack   How many years did you use tobacco: 10 last 1999   Alcohol use (drinks per week): 1/5 day last 1999   Diet:   Do you drink/eat things with caffeine: Yes   Marital status:   Divorced                               What year were you married? 1980   Do you live in a house, apartment, assisted living, condo, trailer, etc.?    Is it one or more stories? 1   How many persons live in your home? 1   Do you have pets in your home?( please list) Cat   Highest Level of education completed? Post Grad   Current or past profession: Engineer, manufacturing.   Do you exercise? Little                                 Type and how  often?   Do you have a living will? Yes   Do you have a DNR form?        No                           If not, do you want to discuss one?   Do you have signed POA/HPOA forms?   Yes                     If so, please bring to you appointment      Do you have any difficulty bathing or dressing yourself? Yes   Do you have any difficulty preparing food or eating? No   Do you have any difficulty managing your medications? No   Do you have any difficulty managing your finances? No   Do you have any difficulty affording your medications?  No   Social Determinants of Health   Financial Resource Strain: Low Risk  (05/15/2021)   Overall Financial Resource Strain (CARDIA)    Difficulty of Paying Living Expenses: Not hard at all  Food Insecurity: No Food Insecurity (05/24/2022)   Hunger Vital Sign    Worried About Running Out of Food in the Last Year: Never true    Ran Out of Food in the Last Year: Never true  Transportation Needs:  No Transportation Needs (05/24/2022)   PRAPARE - Administrator, Civil Service (Medical): No    Lack of Transportation (Non-Medical): No  Physical Activity: Not on file  Stress: Not on file  Social Connections: Not on file    Tobacco Counseling Counseling given: Not Answered   Clinical Intake:  Pre-visit preparation completed: No  Pain : 0-10 Pain Score: 2  Pain Type: Chronic pain Pain Location: Hand (shins) Pain Orientation: Right, Left Pain Radiating Towards: No Pain Descriptors / Indicators: Sharp Pain Onset: More than a month ago Pain Frequency: Constant Pain Relieving Factors: Tramadol,volteran and tylenol Effect of Pain on Daily Activities: yes.hard to hold things  Pain Relieving Factors: Tramadol,volteran and tylenol  BMI - recorded:  (Not weighed) Nutritional Risks: None Diabetes: Yes CBG done?: No CBG resulted in Enter/ Edit results?: Yes (170) Did pt. bring in CBG monitor from home?: Yes (recall CBG in the averange in the  170's) Glucose Meter Downloaded?: No  How often do you need to have someone help you when you read instructions, pamphlets, or other written materials from your doctor or pharmacy?: 1 - Never What is the last grade level you completed in school?: Post Graduate  Diabetic?Yes  Interpreter Needed?: No  Information entered by :: Porsha [email or web address].CMA   Activities of Daily Living    06/28/2022   12:04 PM 06/28/2022   11:23 AM  In your present state of health, do you have any difficulty performing the following activities:  Hearing? 1 0  Comment wers hearing aids   Vision? 0 0  Difficulty concentrating or making decisions? 1 0  Comment Remembering   Walking or climbing stairs? 1 1  Comment uses power wheekchair   Dressing or bathing? 0 0  Doing errands, shopping? 0 0  Preparing Food and eating ? Y N  Comment hard to swallow on right side working with Speech therapy   Using the Toilet? N N  In the past six months, have you accidently leaked urine? Y N  Do you have problems with loss of bowel control? N N  Managing your Medications? N N  Managing your Finances? N N  Housekeeping or managing your Housekeeping? [place]  Comment Has Programmer, applications     Patient Care Team: Jakolby Sedivy, [name], NP as PCP - General (Family Medicine) Maisie Fus, MD as PCP - Cardiology (Cardiology) Duke Salvia, MD as PCP - Electrophysiology (Cardiology)  Indicate any recent Medical Services you may have received from other than [facility] providers in the past year (date may be approximate).     Assessment:   This is a routine wellness examination for [facility].  Hearing/Vision screen Hearing Screening - Comments:: Wear hearing aids/ no hearing concerns Vision Screening - Comments:: Wear glasses and no vision concerns  Dietary issues and exercise activities discussed: Current Exercise Habits: Home exercise routine, Type of exercise: stretching;strength training/weights, Time (Minutes): 45, Frequency  (Times/Week): 3, Weekly Exercise (Minutes/Week): 135, Intensity: Mild, Exercise limited by: orthopedic condition(s) (on power wheelchair)   Goals Addressed             This Visit's Progress    Patient Stated       Get Hgb A1C under 7.0        Depression Screen    06/28/2022   11:23 AM  PHQ 2/9 Scores  PHQ - 2 Score 0    Fall Risk    06/28/2022   11:22 AM 02/20/2022    1:51 PM 01/15/2022  3:22 PM 12/22/2021    9:34 AM 09/20/2021   10:27 AM  Fall Risk   Falls in the past year? 0 0 0 0 0  Number falls in past yr: 0 0 0 0 0  Injury with Fall? 0 0 0 0 0  Risk for fall due to : No Fall Risks No Fall Risks No Fall Risks No Fall Risks No Fall Risks  Follow up Falls evaluation completed Falls evaluation completed Falls evaluation completed Falls evaluation completed Falls evaluation completed    FALL RISK PREVENTION PERTAINING TO THE HOME:  Any stairs in or around the home? No  If so, are there any without handrails? No  Home free of loose throw rugs in walkways, pet beds, electrical cords, etc? No  Adequate lighting in your home to reduce risk of falls? Yes   ASSISTIVE DEVICES UTILIZED TO PREVENT FALLS:  Life alert? Yes  Use of a cane, walker or w/c? Yes  Grab bars in the bathroom? Yes  Shower chair or bench in shower? Yes  Elevated toilet seat or a handicapped toilet? No   TIMED UP AND GO:  Was the test performed? No .  Length of time to ambulate 10 feet: N/A  sec.   Gait unsteady without use of assistive device, provider informed and interventions were implemented  Cognitive Function:    06/28/2022   11:27 AM  MMSE - Mini Mental State Exam  Orientation to time 5  Orientation to Place 5  Registration 3  Attention/ Calculation 5  Recall 3  Language- name 2 objects 2  Language- repeat 1  Language- follow 3 step command 3  Language- read & follow direction 1  Write a sentence 1  Copy design 0  Total score 29        Immunizations Immunization History   Administered Date(s) Administered   Fluad Quad(high Dose 65+) 11/19/2020, 11/02/2021   H1N1 01/24/2008   Moderna Sars-Covid-2 Vaccination 04/03/2019, 05/05/2019, 12/20/2019, 05/23/2020   Zoster Recombinat (Shingrix) 03/01/2020    TDAP status: Due, Education has been provided regarding the importance of this vaccine. Advised may receive this vaccine at local pharmacy or Health Dept. Aware to provide a copy of the vaccination record if obtained from local pharmacy or Health Dept. Verbalized acceptance and understanding.  Flu Vaccine status: Up to date  Pneumococcal vaccine status: Due, Education has been provided regarding the importance of this vaccine. Advised may receive this vaccine at local pharmacy or Health Dept. Aware to provide a copy of the vaccination record if obtained from local pharmacy or Health Dept. Verbalized acceptance and understanding.  Covid-19 vaccine status: Information provided on how to obtain vaccines.   Qualifies for Shingles Vaccine? Yes   Zostavax completed Yes   Shingrix Completed?: Yes  Screening Tests Health Maintenance  Topic Date Due   FOOT EXAM  Never done   Diabetic kidney evaluation - Urine ACR  Never done   DTaP/Tdap/Td (1 - Tdap) Never done   Pneumonia Vaccine 33+ Years old (1 of 1 - PCV) Never done   Zoster Vaccines- Shingrix (2 of 2) 04/26/2020   COVID-19 Vaccine (5 - 2023-24 season) 10/20/2021   HEMOGLOBIN A1C  06/20/2022   INFLUENZA VACCINE  09/20/2022   Diabetic kidney evaluation - eGFR measurement  06/06/2023   OPHTHALMOLOGY EXAM  06/21/2023   Medicare Annual Wellness (AWV)  06/28/2023   Hepatitis C Screening  Completed   HPV VACCINES  Aged Out   COLONOSCOPY (Pts 45-95yrs Insurance coverage will need  to be confirmed)  Discontinued    Health Maintenance  Health Maintenance Due  Topic Date Due   FOOT EXAM  Never done   Diabetic kidney evaluation - Urine ACR  Never done   DTaP/Tdap/Td (1 - Tdap) Never done   Pneumonia Vaccine 65+  Years old (1 of 1 - PCV) Never done   Zoster Vaccines- Shingrix (2 of 2) 04/26/2020   COVID-19 Vaccine (5 - 2023-24 season) 10/20/2021   HEMOGLOBIN A1C  06/20/2022    Colorectal cancer screening: No longer required.   Lung Cancer Screening: (Low Dose CT Chest recommended if Age 32-80 years, 30 pack-year currently smoking OR have quit w/in 15years.) does not qualify.   Lung Cancer Screening Referral: No   Additional Screening:  Hepatitis C Screening: does qualify; Completed Yes   Vision Screening: Recommended annual ophthalmology exams for early detection of glaucoma and other disorders of the eye. Is the patient up to date with their annual eye exam?  Yes  Who is the provider or what is the name of the office in which the patient attends annual eye exams? Dr.Orr  If pt is not established with a provider, would they like to be referred to a provider to establish care? No .   Dental Screening: Recommended annual dental exams for proper oral hygiene  Community Resource Referral / Chronic Care Management: CRR required this visit?  No   CCM required this visit?  No      Plan:     I have personally reviewed and noted the following in the patient's chart:   Medical and social history Use of alcohol, tobacco or illicit drugs  Current medications and supplements including opioid prescriptions. Patient is currently taking opioid prescriptions. Information provided to patient regarding non-opioid alternatives. Patient advised to discuss non-opioid treatment plan with their provider. Functional ability and status Nutritional status Physical activity Advanced directives List of other physicians Hospitalizations, surgeries, and ER visits in previous 12 months Vitals Screenings to include cognitive, depression, and falls Referrals and appointments  In addition, I have reviewed and discussed with patient certain preventive protocols, quality metrics, and best practice recommendations.  A written personalized care plan for preventive services as well as general preventive health recommendations were provided to patient.   Caesar Bookman, NP   06/28/2022   Nurse Notes: Advised to get Tdap vaccine at the Pharmacy.Also advised to get second shingles vaccine administration dates from Pharmacy.

## 2022-06-28 NOTE — Patient Instructions (Signed)
Jesse [name] , Thank you for taking time to come for your Medicare Wellness Visit. I appreciate your ongoing commitment to your health goals. Please review the following plan we discussed and let me know if I can assist you in the future.   Screening recommendations/referrals: Colonoscopy : N/A  Recommended yearly ophthalmology/optometry visit for glaucoma screening and checkup Recommended yearly dental visit for hygiene and checkup  Vaccinations: Influenza vaccine due annually in September/October Pneumococcal vaccine : Given today  Tdap vaccine :Please get vaccine at your pharmacy  Shingles vaccine : Please obtain records for the 2 nd shingles vaccine from pharmacy     Advanced directives: Yes   Conditions/risks identified:  advanced age (>19men, >81 women);obesity (BMI >30kg/m2);diabetes mellitus;dyslipidemia;male gender;smoking/ tobacco exposure  Next appointment: 1 year   Preventive Care 48 Years and Older, Male Preventive care refers to lifestyle choices and visits with your health care provider that can promote health and wellness. What does preventive care include? A yearly physical exam. This is also called an annual well check. Dental exams once or twice a year. Routine eye exams. Ask your health care provider how often you should have your eyes checked. Personal lifestyle choices, including: Daily care of your teeth and gums. Regular physical activity. Eating a healthy diet. Avoiding tobacco and drug use. Limiting alcohol use. Practicing safe sex. Taking low doses of aspirin every day. Taking vitamin and mineral supplements as recommended by your health care provider. What happens during an annual well check? The services and screenings done by your health care provider during your annual well check will depend on your age, overall health, lifestyle risk factors, and family history of disease. Counseling  Your health care provider may ask you questions about  your: Alcohol use. Tobacco use. Drug use. Emotional well-being. Home and relationship well-being. Sexual activity. Eating habits. History of falls. Memory and ability to understand (cognition). Work and work Astronomer. Screening  You may have the following tests or measurements: Height, weight, and BMI. Blood pressure. Lipid and cholesterol levels. These may be checked every 5 years, or more frequently if you are over 25 years old. Skin check. Lung cancer screening. You may have this screening every year starting at age 76 if you have a 30-pack-year history of smoking and currently smoke or have quit within the past 15 years. Fecal occult blood test (FOBT) of the stool. You may have this test every year starting at age 56. Flexible sigmoidoscopy or colonoscopy. You may have a sigmoidoscopy every 5 years or a colonoscopy every 10 years starting at age 101. Prostate cancer screening. Recommendations will vary depending on your family history and other risks. Hepatitis C blood test. Hepatitis B blood test. Sexually transmitted disease (STD) testing. Diabetes screening. This is done by checking your blood sugar (glucose) after you have not eaten for a while (fasting). You may have this done every 1-3 years. Abdominal aortic aneurysm (AAA) screening. You may need this if you are a current or former smoker. Osteoporosis. You may be screened starting at age 31 if you are at high risk. Talk with your health care provider about your test results, treatment options, and if necessary, the need for more tests. Vaccines  Your health care provider may recommend certain vaccines, such as: Influenza vaccine. This is recommended every year. Tetanus, diphtheria, and acellular pertussis (Tdap, Td) vaccine. You may need a Td booster every 10 years. Zoster vaccine. You may need this after age 46. Pneumococcal 13-valent conjugate (PCV13) vaccine. One dose  is recommended after age 70. Pneumococcal  polysaccharide (PPSV23) vaccine. One dose is recommended after age 75. Talk to your health care provider about which screenings and vaccines you need and how often you need them. This information is not intended to replace advice given to you by your health care provider. Make sure you discuss any questions you have with your health care provider. Document Released: 03/04/2015 Document Revised: 10/26/2015 Document Reviewed: 12/07/2014 Elsevier Interactive Patient Education  2017 [place] Prevention in the Home Falls can cause injuries. They can happen to people of all ages. There are many things you can do to make your home safe and to help prevent falls. What can I do on the outside of my home? Regularly fix the edges of walkways and driveways and fix any cracks. Remove anything that might make you trip as you walk through a door, such as a raised step or threshold. Trim any bushes or trees on the path to your home. Use bright outdoor lighting. Clear any walking paths of anything that might make someone trip, such as rocks or tools. Regularly check to see if handrails are loose or broken. Make sure that both sides of any steps have handrails. Any raised decks and porches should have guardrails on the edges. Have any leaves, snow, or ice cleared regularly. Use sand or salt on walking paths during winter. Clean up any spills in your garage right away. This includes oil or grease spills. What can I do in the bathroom? Use night lights. Install grab bars by the toilet and in the tub and shower. Do not use towel bars as grab bars. Use non-skid mats or decals in the tub or shower. If you need to sit down in the shower, use a plastic, non-slip stool. Keep the floor dry. Clean up any water that spills on the floor as soon as it happens. Remove soap buildup in the tub or shower regularly. Attach bath mats securely with double-sided non-slip rug tape. Do not have throw rugs and other  things on the floor that can make you trip. What can I do in the bedroom? Use night lights. Make sure that you have a light by your bed that is easy to reach. Do not use any sheets or blankets that are too big for your bed. They should not hang down onto the floor. Have a firm chair that has side arms. You can use this for support while you get dressed. Do not have throw rugs and other things on the floor that can make you trip. What can I do in the kitchen? Clean up any spills right away. Avoid walking on wet floors. Keep items that you use a lot in easy-to-reach places. If you need to reach something above you, use a strong step stool that has a grab bar. Keep electrical cords out of the way. Do not use floor polish or wax that makes floors slippery. If you must use wax, use non-skid floor wax. Do not have throw rugs and other things on the floor that can make you trip. What can I do with my stairs? Do not leave any items on the stairs. Make sure that there are handrails on both sides of the stairs and use them. Fix handrails that are broken or loose. Make sure that handrails are as long as the stairways. Check any carpeting to make sure that it is firmly attached to the stairs. Fix any carpet that is loose or worn. Avoid  having throw rugs at the top or bottom of the stairs. If you do have throw rugs, attach them to the floor with carpet tape. Make sure that you have a light switch at the top of the stairs and the bottom of the stairs. If you do not have them, ask someone to add them for you. What else can I do to help prevent falls? Wear shoes that: Do not have high heels. Have rubber bottoms. Are comfortable and fit you well. Are closed at the toe. Do not wear sandals. If you use a stepladder: Make sure that it is fully opened. Do not climb a closed stepladder. Make sure that both sides of the stepladder are locked into place. Ask someone to hold it for you, if possible. Clearly  mark and make sure that you can see: Any grab bars or handrails. First and last steps. Where the edge of each step is. Use tools that help you move around (mobility aids) if they are needed. These include: Canes. Walkers. Scooters. Crutches. Turn on the lights when you go into a dark area. Replace any light bulbs as soon as they burn out. Set up your furniture so you have a clear path. Avoid moving your furniture around. If any of your floors are uneven, fix them. If there are any pets around you, be aware of where they are. Review your medicines with your doctor. Some medicines can make you feel dizzy. This can increase your chance of falling. Ask your doctor what other things that you can do to help prevent falls. This information is not intended to replace advice given to you by your health care provider. Make sure you discuss any questions you have with your health care provider. Document Released: 12/02/2008 Document Revised: 07/14/2015 Document Reviewed: 03/12/2014 Elsevier Interactive Patient Education  2017 [employer].

## 2022-06-28 NOTE — Telephone Encounter (Signed)
Alprazolam refilled.  

## 2022-06-29 ENCOUNTER — Ambulatory Visit: Payer: Medicare PPO | Admitting: Internal Medicine

## 2022-07-02 ENCOUNTER — Telehealth: Payer: Self-pay

## 2022-07-02 NOTE — Telephone Encounter (Signed)
Following alert received from [employer] received for Device alert for exceed AF ongoing form 5/9, controlled rates. Burden 1.3%, no OAC route to triage.  Patient called advised AF noted on pacemaker report. No OAC on file. Patient denies any symptoms. Recommends AF clinic referral to discuss OAC. Pt agreeable.

## 2022-07-03 DIAGNOSIS — I130 Hypertensive heart and chronic kidney disease with heart failure and stage 1 through stage 4 chronic kidney disease, or unspecified chronic kidney disease: Secondary | ICD-10-CM | POA: Diagnosis not present

## 2022-07-03 DIAGNOSIS — K56609 Unspecified intestinal obstruction, unspecified as to partial versus complete obstruction: Secondary | ICD-10-CM | POA: Diagnosis not present

## 2022-07-03 DIAGNOSIS — N184 Chronic kidney disease, stage 4 (severe): Secondary | ICD-10-CM | POA: Diagnosis not present

## 2022-07-03 DIAGNOSIS — N2889 Other specified disorders of kidney and ureter: Secondary | ICD-10-CM | POA: Diagnosis not present

## 2022-07-03 DIAGNOSIS — E1122 Type 2 diabetes mellitus with diabetic chronic kidney disease: Secondary | ICD-10-CM | POA: Diagnosis not present

## 2022-07-03 DIAGNOSIS — K436 Other and unspecified ventral hernia with obstruction, without gangrene: Secondary | ICD-10-CM | POA: Diagnosis not present

## 2022-07-03 DIAGNOSIS — I4430 Unspecified atrioventricular block: Secondary | ICD-10-CM | POA: Diagnosis not present

## 2022-07-03 DIAGNOSIS — I5032 Chronic diastolic (congestive) heart failure: Secondary | ICD-10-CM | POA: Diagnosis not present

## 2022-07-03 DIAGNOSIS — G4733 Obstructive sleep apnea (adult) (pediatric): Secondary | ICD-10-CM | POA: Diagnosis not present

## 2022-07-04 DIAGNOSIS — M79671 Pain in right foot: Secondary | ICD-10-CM | POA: Diagnosis not present

## 2022-07-04 DIAGNOSIS — E1140 Type 2 diabetes mellitus with diabetic neuropathy, unspecified: Secondary | ICD-10-CM | POA: Diagnosis not present

## 2022-07-04 DIAGNOSIS — F0630 Mood disorder due to known physiological condition, unspecified: Secondary | ICD-10-CM | POA: Diagnosis not present

## 2022-07-04 DIAGNOSIS — B351 Tinea unguium: Secondary | ICD-10-CM | POA: Diagnosis not present

## 2022-07-04 DIAGNOSIS — L600 Ingrowing nail: Secondary | ICD-10-CM | POA: Diagnosis not present

## 2022-07-04 DIAGNOSIS — M79672 Pain in left foot: Secondary | ICD-10-CM | POA: Diagnosis not present

## 2022-07-07 DIAGNOSIS — K56609 Unspecified intestinal obstruction, unspecified as to partial versus complete obstruction: Secondary | ICD-10-CM | POA: Diagnosis not present

## 2022-07-07 DIAGNOSIS — E118 Type 2 diabetes mellitus with unspecified complications: Secondary | ICD-10-CM | POA: Diagnosis not present

## 2022-07-07 DIAGNOSIS — E119 Type 2 diabetes mellitus without complications: Secondary | ICD-10-CM | POA: Diagnosis not present

## 2022-07-07 DIAGNOSIS — Z794 Long term (current) use of insulin: Secondary | ICD-10-CM | POA: Diagnosis not present

## 2022-07-07 DIAGNOSIS — Z933 Colostomy status: Secondary | ICD-10-CM | POA: Diagnosis not present

## 2022-07-10 DIAGNOSIS — K56609 Unspecified intestinal obstruction, unspecified as to partial versus complete obstruction: Secondary | ICD-10-CM | POA: Diagnosis not present

## 2022-07-10 DIAGNOSIS — G4733 Obstructive sleep apnea (adult) (pediatric): Secondary | ICD-10-CM | POA: Diagnosis not present

## 2022-07-10 DIAGNOSIS — N2889 Other specified disorders of kidney and ureter: Secondary | ICD-10-CM | POA: Diagnosis not present

## 2022-07-10 DIAGNOSIS — I130 Hypertensive heart and chronic kidney disease with heart failure and stage 1 through stage 4 chronic kidney disease, or unspecified chronic kidney disease: Secondary | ICD-10-CM | POA: Diagnosis not present

## 2022-07-10 DIAGNOSIS — I5032 Chronic diastolic (congestive) heart failure: Secondary | ICD-10-CM | POA: Diagnosis not present

## 2022-07-10 DIAGNOSIS — E1122 Type 2 diabetes mellitus with diabetic chronic kidney disease: Secondary | ICD-10-CM | POA: Diagnosis not present

## 2022-07-10 DIAGNOSIS — K436 Other and unspecified ventral hernia with obstruction, without gangrene: Secondary | ICD-10-CM | POA: Diagnosis not present

## 2022-07-10 DIAGNOSIS — N184 Chronic kidney disease, stage 4 (severe): Secondary | ICD-10-CM | POA: Diagnosis not present

## 2022-07-10 DIAGNOSIS — I4430 Unspecified atrioventricular block: Secondary | ICD-10-CM | POA: Diagnosis not present

## 2022-07-10 NOTE — Progress Notes (Signed)
Remote pacemaker transmission.   

## 2022-07-11 ENCOUNTER — Ambulatory Visit (HOSPITAL_COMMUNITY)
Admission: RE | Admit: 2022-07-11 | Discharge: 2022-07-11 | Disposition: A | Discharge status: Home / Self Care | Payer: Medicare PPO | Source: Ambulatory Visit | Attending: Internal Medicine | Admitting: Internal Medicine

## 2022-07-11 VITALS — BP 124/70 | HR 70 | Ht 72.0 in | Wt 355.0 lb

## 2022-07-11 DIAGNOSIS — D6869 Other thrombophilia: Secondary | ICD-10-CM | POA: Insufficient documentation

## 2022-07-11 DIAGNOSIS — I480 Paroxysmal atrial fibrillation: Secondary | ICD-10-CM | POA: Insufficient documentation

## 2022-07-11 DIAGNOSIS — Z7901 Long term (current) use of anticoagulants: Secondary | ICD-10-CM | POA: Diagnosis not present

## 2022-07-11 DIAGNOSIS — G4733 Obstructive sleep apnea (adult) (pediatric): Secondary | ICD-10-CM | POA: Insufficient documentation

## 2022-07-11 DIAGNOSIS — Z6841 Body Mass Index (BMI) 40.0 and over, adult: Secondary | ICD-10-CM | POA: Insufficient documentation

## 2022-07-11 MED ORDER — APIXABAN 5 MG PO TABS: 5.0000 mg | ORAL_TABLET | Freq: Two times a day (BID) | ORAL | 3 refills | Status: DC

## 2022-07-11 NOTE — Patient Instructions (Addendum)
STOP Plavix  START Eliquis 5mg  twice a day   Send transmission from your device on 6/19

## 2022-07-11 NOTE — Progress Notes (Signed)
Primary Care Physician: Jesse Bookman, NP Primary Cardiologist: Dr. Wyline Mood Primary Electrophysiologist: Dr. Graciela Husbands Referring Physician:    KEVAN SIBBALD is a 72 y.o. 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 Device alert on 5/9 showing Afib with 1.3% burden. Patient has a CHADS2VASC score of 6.  On evaluation today, he is currently in NSR. He did not have cardiac awareness of Afib episode. He is on continuous oxygen. He takes Plavix daily due to history of TIA. He does not drink alcohol, uses his CPAP, and drinks 1-2 cups of coffee daily.  Today, he denies symptoms of palpitations, chest pain, shortness of breath, orthopnea, PND, lower extremity edema, dizziness, presyncope, syncope, snoring, daytime somnolence, bleeding, or neurologic sequela. The patient is tolerating medications without difficulties and is otherwise without complaint today.   Atrial Fibrillation Risk Factors:  he does have symptoms or diagnosis of sleep apnea. he is compliant with CPAP therapy. he does not have a history of rheumatic fever. he does not have a history of alcohol use. The patient does not have a history of early familial atrial fibrillation or other arrhythmias.  he has a BMI of Body mass index is 48.15 kg/m.Marland Kitchen Filed Weights   07/11/22 1029  Weight: (!) 161 kg    Family History  Problem Relation Age of Onset   Diabetes Brother    Bipolar disorder Daughter    Anxiety disorder Son     Atrial Fibrillation Management history:  Previous antiarrhythmic drugs: None Previous cardioversions:  None Previous ablations: None Anticoagulation history: None   Past Medical History:  Diagnosis Date   Alcohol use disorder in remission 1999   Anxiety and depression    At high risk for falls    Former cigarette smoker 1999   Generalized anxiety disorder    Hard of hearing    Hyperlipidemia due to type 2 diabetes mellitus (HCC)    Hypertension    Morbid obesity (HCC)    Neuropathy    O2 dependent    Risk for falls    Sleep apnea    Type 2 diabetes mellitus (HCC)    Ventral hernia with bowel obstruction    Per Hospital Encounter on 05/21/22   Past Surgical History:  Procedure Laterality Date   APPENDECTOMY  1980   IR THORACENTESIS ASP PLEURAL SPACE W/IMG GUIDE  05/18/2021   IR THORACENTESIS ASP PLEURAL SPACE W/IMG GUIDE  10/06/2021   KNEE SURGERY Right    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   Procedure: PACEMAKER IMPLANT;  Surgeon: Duke Salvia, MD;  Location: [facility] CV LAB;  Service: Cardiovascular;  Laterality: N/A;    Current Outpatient Medications  Medication Sig Dispense Refill   Accu-Chek Softclix Lancets lancets USE TO TEST BLOOD GLUCOSE ONCE DAILY AS DIRECTED 102 each 1   acetaminophen (TYLENOL) 325 MG tablet Take 2 tablets (650 mg total) by mouth every 6 (six) hours as needed for mild pain or moderate pain. 20 tablet 0   alfuzosin (UROXATRAL) 10 MG 24 hr tablet TAKE 1 TABLET BY MOUTH EVERY DAY (Patient taking differently: Take 10 mg by mouth daily with breakfast.) 90 tablet 1   ALPRAZolam (  XANAX) 0.5 MG tablet TAKE 1 TABLET(0.5 MG) BY MOUTH TWICE DAILY AS NEEDED FOR ANXIETY OR SLEEP 180 tablet 0   apixaban (ELIQUIS) 5 MG TABS tablet Take 1 tablet (5 mg total) by mouth 2 (two) times daily. 60 tablet 3   calcium carbonate (TUMS - DOSED IN MG ELEMENTAL CALCIUM) 500 MG chewable tablet Chew 1 tablet by mouth 2 (two) times daily.     Cholecalciferol (VITAMIN D3) 25 MCG (1000 UT) capsule Take 1 capsule (1,000 Units total) by mouth daily. 30 capsule 5   cyanocobalamin 1000  MCG tablet Take 1,000 mcg by mouth daily.     diclofenac Sodium (VOLTAREN) 1 % GEL Apply 2 g topically 4 (four) times daily. Apply to your right neck and shoulder area 3-4 times a day as needed 50 g 0   DULoxetine (CYMBALTA) 60 MG capsule TAKE 1 CAPSULE(60 MG) BY MOUTH DAILY (Patient taking differently: Take 60 mg by mouth daily.) 90 capsule 1   furosemide (LASIX) 40 MG tablet Take 2 tablets (80 mg total) by mouth in the morning and at bedtime. 30 tablet    glucose blood (ACCU-CHEK GUIDE) test strip USE TO TEST BLOOD SUGAR ONCE DAILY 200 strip 2   insulin glargine (LANTUS SOLOSTAR) 100 UNIT/ML Solostar Pen Inject 50 Units into the skin at bedtime. 15 mL 5   Insulin Pen Needle 32G X 4 MM MISC Use to inject insulin twice daily. Dx: E11.22 200 each 3   isosorbide mononitrate (IMDUR) 60 MG 24 hr tablet Take 1 tablet (60 mg total) by mouth daily. 90 tablet 1   JARDIANCE 25 MG TABS tablet TAKE 1 TABLET BY MOUTH EVERY DAY 90 tablet 1   ketoconazole (NIZORAL) 2 % cream Apply 1 application topically as needed for irritation. 15 g 5   Lancet Device MISC 1 Device by Does not apply route daily. 1 each 11   ondansetron (ZOFRAN) 4 MG tablet Take 1 tablet (4 mg total) by mouth every 8 (eight) hours as needed for nausea or vomiting. 20 tablet 0   OVER THE COUNTER MEDICATION Apply 1 Application topically every other day. Head and shoulder medicated shampoo alternate with selsun blue medicated     OXYGEN Inhale 2 L into the lungs continuous.     pantoprazole (PROTONIX) 40 MG tablet TAKE 1 TABLET(40 MG) BY MOUTH TWICE DAILY BEFORE A MEAL (Patient taking differently: Take 40 mg by mouth 2 (two) times daily.) 180 tablet 1   potassium chloride SA (KLOR-CON M) 20 MEQ tablet Take 1 tablet (20 mEq total) by mouth 2 (two) times daily.     Red Yeast Rice 600 MG CAPS Take 1,200 mg by mouth daily.     rOPINIRole (REQUIP) 2 MG tablet TAKE 1 TABLET(2 MG) BY MOUTH AT BEDTIME (Patient taking differently: Take 2 mg by mouth at  bedtime.) 90 tablet 3   rosuvastatin (CRESTOR) 10 MG tablet TAKE 1 TABLET BY MOUTH EVERY DAY 90 tablet 1   simethicone (MYLICON) 80 MG chewable tablet Chew 80 mg by mouth every 6 (six) hours as needed for flatulence.     tiZANidine (ZANAFLEX) 2 MG tablet TAKE 1 TABLET BY MOUTH TWICE DAILY (Patient taking differently: Take 2 mg by mouth as needed.) 180 tablet 1   topiramate (TOPAMAX) 25 MG tablet TAKE 1 TABLET BY MOUTH EVERY DAY (Patient taking differently: Take 25 mg by mouth daily.) 90 tablet 2   traMADol (ULTRAM) 50 MG tablet TAKE 1 TABLET(50 MG) BY MOUTH TWICE DAILY AS NEEDED (  Patient taking differently: 50 mg 2 (two) times daily as needed for moderate pain.) 180 tablet 0   traZODone (DESYREL) 50 MG tablet TAKE 2 TABLETS(100 MG) BY MOUTH AT BEDTIME (Patient taking differently: Take 100 mg by mouth at bedtime.) 60 tablet 3   triamcinolone cream (KENALOG) 0.1 % Apply 1 application. topically 2 (two) times daily. Apply to affected areas on shin area 45 g 0   VICTOZA 18 MG/3ML SOPN ADMINISTER 1.8 MG UNDER THE SKIN EVERY DAY (Patient taking differently: Inject 1.8 mg into the skin daily.) 27 mL 5   Vitamin D, Ergocalciferol, (DRISDOL) 1.25 MG (50000 UNIT) CAPS capsule Take 50,000 Units by mouth once a week.     No current facility-administered medications for this encounter.    Allergies  Allergen Reactions   Tape Rash    If left on for a long time    Social History   Socioeconomic History   Marital status: Divorced    Spouse name: Not on file   Number of children: 3   Years of education: Not on file   Highest education level: Master's degree (e.g., MA, MS, MEng, MEd, MSW, MBA)  Occupational History   Occupation: Retired  Tobacco Use   Smoking status: Former    Packs/day: 1    Types: Cigarettes    Quit date: 1999    Years since quitting: 25.4   Smokeless tobacco: Never  Vaping Use   Vaping Use: Never used  Substance and Sexual Activity   Alcohol use: Not Currently   Drug use:  Never   Sexual activity: Not on file  Other Topics Concern   Not on file  Social History Narrative   Tobacco use, amount per day now: 0   Past tobacco use, amount per day: 1 pack   How many years did you use tobacco: 10 last 1999   Alcohol use (drinks per week): 1/5 day last 1999   Diet:   Do you drink/eat things with caffeine: Yes   Marital status:   Divorced                               What year were you married? 1980   Do you live in a house, apartment, assisted living, condo, trailer, etc.?    Is it one or more stories? 1   How many persons live in your home? 1   Do you have pets in your home?( please list) Cat   Highest Level of education completed? Post Grad   Current or past profession: Engineer, manufacturing.   Do you exercise? Little                                 Type and how often?   Do you have a living will? Yes   Do you have a DNR form?        No                           If not, do you want to discuss one?   Do you have signed POA/HPOA forms?   Yes                     If so, please bring to you appointment      Do you have any difficulty bathing or dressing  yourself? Yes   Do you have any difficulty preparing food or eating? No   Do you have any difficulty managing your medications? No   Do you have any difficulty managing your finances? No   Do you have any difficulty affording your medications?  No   Social Determinants of Health   Financial Resource Strain: Low Risk  (05/15/2021)   Overall Financial Resource Strain (CARDIA)    Difficulty of Paying Living Expenses: Not hard at all  Food Insecurity: No Food Insecurity (05/24/2022)   Hunger Vital Sign    Worried About Running Out of Food in the Last Year: Never true    Ran Out of Food in the Last Year: Never true  Transportation Needs: No Transportation Needs (05/24/2022)   PRAPARE - Administrator, Civil Service (Medical): No    Lack of Transportation (Non-Medical): No  Physical Activity: Not on file   Stress: Not on file  Social Connections: Not on file  Intimate Partner Violence: Not on file    ROS- All systems are reviewed and negative except as per the HPI above.  Physical Exam: Vitals:   07/11/22 1029  BP: 124/70  Pulse: 70  Weight: (!) 161 kg  Height: 6' (1.829 m)    GEN- The patient is an obese male with nasal cannula sitting in wheelchair, alert and oriented x 3 today.   Head- normocephalic, atraumatic Eyes-  Sclera clear, conjunctiva pink Ears- hearing intact Oropharynx- clear Neck- supple  Lungs- Clear to ausculation bilaterally, normal work of breathing Heart- Regular rate and rhythm, no murmurs, rubs or gallops  GI- soft, NT, ND, + BS Extremities- no clubbing, cyanosis, or edema MS- no significant deformity or atrophy Skin- no rash or lesion Psych- euthymic mood, full affect Neuro- strength and sensation are intact  Wt Readings from Last 3 Encounters:  07/11/22 (!) 161 kg  05/23/22 (!) 160.3 kg  03/13/22 (!) 157.9 kg    EKG today demonstrates  Vent. rate 70 BPM PR interval 188 ms QRS duration 148 ms QT/QTcB 452/488 ms P-R-T axes 77 85 -25 AV dual-paced rhythm Abnormal ECG When compared with ECG of 31-Oct-2021 19:28, PREVIOUS ECG IS PRESENT  Echo 11/02/21 demonstrated: 1. Left ventricular ejection fraction, by estimation, is 70 to 75%. Left  ventricular ejection fraction by 2D MOD biplane is 71.7 %. The left  ventricle has hyperdynamic function. The left ventricle has no regional  wall motion abnormalities. There is  severe left ventricular hypertrophy. Left ventricular diastolic parameters  are consistent with Grade I diastolic dysfunction (impaired relaxation).   2. Right ventricular systolic function is normal. The right ventricular  size is normal.   3. Left atrial size was moderately dilated.   4. Right atrial size was moderately dilated.   5. The mitral valve was not well visualized. No evidence of mitral valve  regurgitation.   6. The  aortic valve was not well visualized. Aortic valve regurgitation  is not visualized.   7. Aortic dilatation noted. There is mild dilatation of the aortic root,  measuring 43 mm.   8. Agitated saline contrast bubble study was negative, with no evidence  of any interatrial shunt.   Comparison(s): Changes from prior study are noted. 05/14/2021: LVEF 60-65%,  aortic root dilated to 41 mm.   Epic records are reviewed at length today.  CHA2DS2-VASc Score = 7  The patient's score is based upon: CHF History: 1 HTN History: 1 Diabetes History: 1 Stroke History: 2 Vascular Disease History:  1 Age Score: 1 Gender Score: 0       ASSESSMENT AND PLAN: Paroxysmal Atrial Fibrillation (ICD10:  I48.0) The patient's CHA2DS2-VASc score is 7, indicating a 11.2% annual risk of stroke.    Discussion regarding atrial fibrillation and indication for anticoagulation. Due to low burden noted on device, we will not begin treatment for this. We will proceed with conservative observation.  F/u in 1 month, will have device clinic help him send manual interrogation to recheck burden at that time since he has no cardiac awareness.   Secondary Hypercoagulable State (ICD10:  D68.69) The patient is at significant risk for stroke/thromboembolism based upon his CHA2DS2-VASc Score of 7.  Start Apixaban (Eliquis).  After discussion regarding indication for anticoagulation, patient is in agreement to begin.  Stop plavix. Start Eliquis 5 mg BID. F/u 1 month to check CBC.   3. Obesity Body mass index is 48.15 kg/m. Lifestyle modification was discussed at length including regular exercise and weight reduction. Encouraged daily walking as tolerated.  4. Obstructive sleep apnea Compliant with CPAP   Follow up in 1 month Afib clinic.   Lake Bells, PA-C Afib Clinic [facility] [address] [place], [place] [address] [phone] 07/11/2022 11:35 AM

## 2022-07-13 DIAGNOSIS — Z933 Colostomy status: Secondary | ICD-10-CM | POA: Diagnosis not present

## 2022-07-13 DIAGNOSIS — K56609 Unspecified intestinal obstruction, unspecified as to partial versus complete obstruction: Secondary | ICD-10-CM | POA: Diagnosis not present

## 2022-07-13 DIAGNOSIS — Z794 Long term (current) use of insulin: Secondary | ICD-10-CM | POA: Diagnosis not present

## 2022-07-13 DIAGNOSIS — E119 Type 2 diabetes mellitus without complications: Secondary | ICD-10-CM | POA: Diagnosis not present

## 2022-07-13 DIAGNOSIS — E118 Type 2 diabetes mellitus with unspecified complications: Secondary | ICD-10-CM | POA: Diagnosis not present

## 2022-07-22 ENCOUNTER — Other Ambulatory Visit: Payer: Self-pay | Admitting: Family

## 2022-07-23 DIAGNOSIS — K56609 Unspecified intestinal obstruction, unspecified as to partial versus complete obstruction: Secondary | ICD-10-CM | POA: Diagnosis not present

## 2022-07-23 DIAGNOSIS — Z933 Colostomy status: Secondary | ICD-10-CM | POA: Diagnosis not present

## 2022-07-23 DIAGNOSIS — Z794 Long term (current) use of insulin: Secondary | ICD-10-CM | POA: Diagnosis not present

## 2022-07-23 DIAGNOSIS — E118 Type 2 diabetes mellitus with unspecified complications: Secondary | ICD-10-CM | POA: Diagnosis not present

## 2022-07-23 DIAGNOSIS — E119 Type 2 diabetes mellitus without complications: Secondary | ICD-10-CM | POA: Diagnosis not present

## 2022-07-24 DIAGNOSIS — Z794 Long term (current) use of insulin: Secondary | ICD-10-CM | POA: Diagnosis not present

## 2022-07-24 DIAGNOSIS — K56609 Unspecified intestinal obstruction, unspecified as to partial versus complete obstruction: Secondary | ICD-10-CM | POA: Diagnosis not present

## 2022-07-24 DIAGNOSIS — E118 Type 2 diabetes mellitus with unspecified complications: Secondary | ICD-10-CM | POA: Diagnosis not present

## 2022-07-24 DIAGNOSIS — Z933 Colostomy status: Secondary | ICD-10-CM | POA: Diagnosis not present

## 2022-07-24 DIAGNOSIS — E119 Type 2 diabetes mellitus without complications: Secondary | ICD-10-CM | POA: Diagnosis not present

## 2022-07-26 ENCOUNTER — Other Ambulatory Visit: Payer: Self-pay | Admitting: Family

## 2022-07-26 DIAGNOSIS — G4733 Obstructive sleep apnea (adult) (pediatric): Secondary | ICD-10-CM | POA: Diagnosis not present

## 2022-07-26 DIAGNOSIS — M542 Cervicalgia: Secondary | ICD-10-CM

## 2022-07-26 MED ORDER — METHOCARBAMOL 500 MG PO TABS
500.0000 mg | ORAL_TABLET | Freq: Three times a day (TID) | ORAL | 0 refills | Status: DC | PRN
Start: 2022-07-26 — End: 2022-09-28

## 2022-07-26 NOTE — Progress Notes (Signed)
Robaxin 500 mg tablet script send to pharmacy.

## 2022-08-01 DIAGNOSIS — F0630 Mood disorder due to known physiological condition, unspecified: Secondary | ICD-10-CM | POA: Diagnosis not present

## 2022-08-07 ENCOUNTER — Other Ambulatory Visit: Payer: Self-pay | Admitting: Family

## 2022-08-07 DIAGNOSIS — K219 Gastro-esophageal reflux disease without esophagitis: Secondary | ICD-10-CM

## 2022-08-09 ENCOUNTER — Ambulatory Visit (HOSPITAL_COMMUNITY): Payer: Medicare PPO | Admitting: Internal Medicine

## 2022-08-09 ENCOUNTER — Telehealth: Payer: Self-pay

## 2022-08-09 NOTE — Telephone Encounter (Signed)
Patient transmission did not come through yesterday. Called patient, he is going to re-attempt today.

## 2022-08-09 NOTE — Telephone Encounter (Signed)
Erroneous encounter

## 2022-08-09 NOTE — Telephone Encounter (Signed)
Received Patient's transmission in prep for his AF clinic appt on 6/24:  Normal Device Function. AF burden total 3.4 % since 0ctober 2023.  Most recent AF episode: MAY 24TH - 12 mins, longest episode: 07/06/22 for 7 days.   Presenting is slightly tachycardic.  (Atach w/ VP 1:1).

## 2022-08-09 NOTE — Telephone Encounter (Signed)
-----   Message from Shona Simpson, RN sent at 07/11/2022 11:25 AM EDT ----- Regarding: transmission Pt to send transmission on 6/19 to look at afib burden before his appt on 6/20 with [name] Mellow thanks stacy

## 2022-08-13 ENCOUNTER — Ambulatory Visit (HOSPITAL_COMMUNITY)
Admission: RE | Admit: 2022-08-13 | Discharge: 2022-08-13 | Disposition: A | Discharge status: Home / Self Care | Payer: Medicare PPO | Source: Ambulatory Visit | Attending: Internal Medicine | Admitting: Internal Medicine

## 2022-08-13 VITALS — BP 122/62 | HR 71 | Ht 72.0 in | Wt 360.0 lb

## 2022-08-13 DIAGNOSIS — G4733 Obstructive sleep apnea (adult) (pediatric): Secondary | ICD-10-CM | POA: Insufficient documentation

## 2022-08-13 DIAGNOSIS — Z6841 Body Mass Index (BMI) 40.0 and over, adult: Secondary | ICD-10-CM | POA: Insufficient documentation

## 2022-08-13 DIAGNOSIS — Z7901 Long term (current) use of anticoagulants: Secondary | ICD-10-CM | POA: Diagnosis not present

## 2022-08-13 DIAGNOSIS — E669 Obesity, unspecified: Secondary | ICD-10-CM | POA: Diagnosis not present

## 2022-08-13 DIAGNOSIS — D6869 Other thrombophilia: Secondary | ICD-10-CM | POA: Insufficient documentation

## 2022-08-13 DIAGNOSIS — I4819 Other persistent atrial fibrillation: Secondary | ICD-10-CM | POA: Insufficient documentation

## 2022-08-13 LAB — CBC
HCT: 37.5 % — ABNORMAL LOW (ref 39.0–52.0)
Hemoglobin: 11.3 g/dL — ABNORMAL LOW (ref 13.0–17.0)
MCH: 26 pg (ref 26.0–34.0)
MCHC: 30.1 g/dL (ref 30.0–36.0)
MCV: 86.4 fL (ref 80.0–100.0)
Platelets: 239 10*3/uL (ref 150–400)
RBC: 4.34 MIL/uL (ref 4.22–5.81)
RDW: 16.8 % — ABNORMAL HIGH (ref 11.5–15.5)
WBC: 7.2 10*3/uL (ref 4.0–10.5)
nRBC: 0 % (ref 0.0–0.2)

## 2022-08-13 NOTE — Progress Notes (Signed)
Primary Care Physician: Caesar Bookman, NP Primary Cardiologist: Dr. Wyline Mood Primary Electrophysiologist: Dr. Graciela Husbands Referring Physician:    [name] is a 72 y.o. 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 Device alert on 5/9 showing Afib with 1.3% burden. Patient has a CHADS2VASC score of 6.  On evaluation today, he is currently in NSR. He did not have cardiac awareness of Afib episode. He is on continuous oxygen. He takes Plavix daily due to history of TIA. He does not drink alcohol, uses his CPAP, and drinks 1-2 cups of coffee daily.  On follow up 08/13/22, he is currently in NSR. Device report showed 3.4% Afib burden with episodes on 5/10, 5/17, and 5/24. The episode on 5/17 lasted for 7 days. No episodes in June. He is compliant with Eliquis overall; may have missed 1 dose a couple of weeks ago.   Today, he denies symptoms of palpitations, chest pain, shortness of breath, orthopnea, PND, lower extremity edema, dizziness, presyncope, syncope, snoring, daytime somnolence, bleeding, or neurologic sequela. The patient is tolerating medications without difficulties and is otherwise without complaint today.   Atrial Fibrillation Risk Factors:  he does have symptoms or diagnosis of sleep apnea. he is compliant with CPAP therapy. he does not have a history of rheumatic fever. he does not have a history of alcohol use. The patient does not have a history of early familial atrial fibrillation or other arrhythmias.  he has a BMI of Body mass index is 48.82 kg/m.Marland Kitchen Filed Weights   08/13/22  1010  Weight: (!) 163.3 kg     Family History  Problem Relation Age of Onset   Diabetes Brother    Bipolar disorder Daughter    Anxiety disorder Son     Atrial Fibrillation Management history:  Previous antiarrhythmic drugs: None Previous cardioversions: None Previous ablations: None Anticoagulation history: Eliquis 5 mg BID   Past Medical History:  Diagnosis Date   Alcohol use disorder in remission 1999   Anxiety and depression    At high risk for falls    Former cigarette smoker 1999   Generalized anxiety disorder    Hard of hearing    Hyperlipidemia due to type 2 diabetes mellitus (HCC)    Hypertension    Morbid obesity (HCC)    Neuropathy    O2 dependent    Risk for falls    Sleep apnea    Type 2 diabetes mellitus (HCC)    Ventral hernia with bowel obstruction    Per Hospital Encounter on 05/21/22   Past Surgical History:  Procedure Laterality Date   APPENDECTOMY  1980   IR THORACENTESIS ASP PLEURAL SPACE W/IMG GUIDE  05/18/2021   IR THORACENTESIS ASP PLEURAL SPACE W/IMG GUIDE  10/06/2021   KNEE SURGERY Right    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   Procedure: PACEMAKER IMPLANT;  Surgeon: Duke Salvia, MD;  Location: [facility] INVAS[facility] CV LAB;  Service: Cardiovascular;  Laterality: N/A;    Current Outpatient Medications  Medication Sig Dispense Refill   Accu-Chek Softclix Lancets lancets USE TO TEST BLOOD GLUCOSE ONCE DAILY AS DIRECTED 102 each 1   acetaminophen (TYLENOL) 325 MG tablet  Take 2 tablets (650 mg total) by mouth every 6 (six) hours as needed for mild pain or moderate pain. 20 tablet 0   alfuzosin (UROXATRAL) 10 MG 24 hr tablet TAKE 1 TABLET BY MOUTH EVERY DAY 90 tablet 1   ALPRAZolam (XANAX) 0.5 MG tablet TAKE 1 TABLET(0.5 MG) BY MOUTH TWICE DAILY AS NEEDED FOR ANXIETY OR SLEEP 180 tablet 0   apixaban (ELIQUIS) 5 MG TABS tablet Take 1 tablet (5 mg total) by mouth 2 (two) times daily. 60 tablet 3   calcium carbonate (TUMS - DOSED IN MG ELEMENTAL CALCIUM) 500  MG chewable tablet Chew 1 tablet by mouth 2 (two) times daily.     cyanocobalamin 1000 MCG tablet Take 1,000 mcg by mouth daily.     diclofenac Sodium (VOLTAREN) 1 % GEL Apply 2 g topically 4 (four) times daily. Apply to your right neck and shoulder area 3-4 times a day as needed 50 g 0   DULoxetine (CYMBALTA) 60 MG capsule TAKE 1 CAPSULE(60 MG) BY MOUTH DAILY (Patient taking differently: Take 60 mg by mouth daily.) 90 capsule 1   furosemide (LASIX) 40 MG tablet Take 2 tablets (80 mg total) by mouth in the morning and at bedtime. 30 tablet    glucose blood (ACCU-CHEK GUIDE) test strip USE TO TEST BLOOD SUGAR ONCE DAILY 200 strip 2   insulin glargine (LANTUS SOLOSTAR) 100 UNIT/ML Solostar Pen Inject 50 Units into the skin at bedtime. 15 mL 5   Insulin Pen Needle 32G X 4 MM MISC Use to inject insulin twice daily. Dx: E11.22 200 each 3   isosorbide mononitrate (IMDUR) 60 MG 24 hr tablet Take 1 tablet (60 mg total) by mouth daily. 90 tablet 1   JARDIANCE 25 MG TABS tablet TAKE 1 TABLET BY MOUTH EVERY DAY 90 tablet 1   ketoconazole (NIZORAL) 2 % cream Apply 1 application topically as needed for irritation. 15 g 5   Lancet Device MISC 1 Device by Does not apply route daily. 1 each 11   methocarbamol (ROBAXIN) 500 MG tablet Take 1 tablet (500 mg total) by mouth every 8 (eight) hours as needed for muscle spasms. 90 tablet 0   ondansetron (ZOFRAN) 4 MG tablet Take 1 tablet (4 mg total) by mouth every 8 (eight) hours as needed for nausea or vomiting. 20 tablet 0   OVER THE COUNTER MEDICATION Apply 1 Application topically every other day. Head and shoulder medicated shampoo alternate with selsun blue medicated     OXYGEN Inhale 2 L into the lungs continuous.     pantoprazole (PROTONIX) 40 MG tablet TAKE 1 TABLET(40 MG) BY MOUTH TWICE DAILY BEFORE A MEAL 180 tablet 1   potassium chloride SA (KLOR-CON M) 20 MEQ tablet Take 1 tablet (20 mEq total) by mouth 2 (two) times daily.     Red Yeast Rice 600 MG CAPS Take  1,200 mg by mouth daily.     rOPINIRole (REQUIP) 2 MG tablet TAKE 1 TABLET(2 MG) BY MOUTH AT BEDTIME (Patient taking differently: Take 2 mg by mouth at bedtime.) 90 tablet 3   rosuvastatin (CRESTOR) 10 MG tablet TAKE 1 TABLET BY MOUTH EVERY DAY 90 tablet 1   simethicone (MYLICON) 80 MG chewable tablet Chew 80 mg by mouth every 6 (six) hours as needed for flatulence.     tiZANidine (ZANAFLEX) 2 MG tablet TAKE 1 TABLET BY MOUTH TWICE DAILY (Patient taking differently: Take 2 mg by mouth as needed.) 180 tablet 1   topiramate (TOPAMAX) 25  MG tablet TAKE 1 TABLET BY MOUTH EVERY DAY (Patient taking differently: Take 25 mg by mouth daily.) 90 tablet 2   traMADol (ULTRAM) 50 MG tablet TAKE 1 TABLET(50 MG) BY MOUTH TWICE DAILY AS NEEDED (Patient taking differently: 50 mg 2 (two) times daily as needed for moderate pain.) 180 tablet 0   traZODone (DESYREL) 50 MG tablet TAKE 2 TABLETS(100 MG) BY MOUTH AT BEDTIME (Patient taking differently: Take 100 mg by mouth at bedtime.) 60 tablet 3   triamcinolone cream (KENALOG) 0.1 % Apply 1 application. topically 2 (two) times daily. Apply to affected areas on shin area 45 g 0   VICTOZA 18 MG/3ML SOPN ADMINISTER 1.8 MG UNDER THE SKIN EVERY DAY 27 mL 5   Vitamin D, Ergocalciferol, (DRISDOL) 1.25 MG (50000 UNIT) CAPS capsule Take 50,000 Units by mouth once a week.     Cholecalciferol (VITAMIN D3) 25 MCG (1000 UT) capsule Take 1 capsule (1,000 Units total) by mouth daily. (Patient not taking: Reported on 08/13/2022) 30 capsule 5   No current facility-administered medications for this encounter.    Allergies  Allergen Reactions   Tape Rash    If left on for a long time    ROS- All systems are reviewed and negative except as per the HPI above.  Physical Exam: Vitals:   08/13/22 1010  BP: 122/62  Pulse: 71  Weight: (!) 163.3 kg  Height: 6' (1.829 m)   GEN- The patient is well appearing, alert and oriented x 3 today.   Head- normocephalic, atraumatic Eyes-   Sclera clear, conjunctiva pink Ears- hearing intact Lungs- Clear to ausculation bilaterally, normal work of breathing Heart- Regular rate and rhythm, no murmurs, rubs or gallops, PMI not laterally displaced Extremities- no clubbing, cyanosis, or edema MS- no significant deformity or atrophy Skin- no rash or lesion Psych- euthymic mood, full affect Neuro- strength and sensation are intact   Wt Readings from Last 3 Encounters:  08/13/22 (!) 163.3 kg  07/11/22 (!) 161 kg  05/23/22 (!) 160.3 kg    EKG today demonstrates  Vent. rate 71 BPM PR interval 188 ms QRS duration 158 ms QT/QTcB 468/508 ms P-R-T axes 33 76 -44 AV dual-paced rhythm Abnormal ECG When compared with ECG of 11-Jul-2022 10:54, PREVIOUS ECG IS PRESENT  Echo 11/02/21 demonstrated: 1. Left ventricular ejection fraction, by estimation, is 70 to 75%. Left  ventricular ejection fraction by 2D MOD biplane is 71.7 %. The left  ventricle has hyperdynamic function. The left ventricle has no regional  wall motion abnormalities. There is  severe left ventricular hypertrophy. Left ventricular diastolic parameters  are consistent with Grade I diastolic dysfunction (impaired relaxation).   2. Right ventricular systolic function is normal. The right ventricular  size is normal.   3. Left atrial size was moderately dilated.   4. Right atrial size was moderately dilated.   5. The mitral valve was not well visualized. No evidence of mitral valve  regurgitation.   6. The aortic valve was not well visualized. Aortic valve regurgitation  is not visualized.   7. Aortic dilatation noted. There is mild dilatation of the aortic root,  measuring 43 mm.   8. Agitated saline contrast bubble study was negative, with no evidence  of any interatrial shunt.   Comparison(s): Changes from prior study are noted. 05/14/2021: LVEF 60-65%,  aortic root dilated to 41 mm.   Epic records are reviewed at length today.  CHA2DS2-VASc Score = 7   The patient's score is based  upon: CHF History: 1 HTN History: 1 Diabetes History: 1 Stroke History: 2 Vascular Disease History: 1 Age Score: 1 Gender Score: 0       ASSESSMENT AND PLAN: Persistent Atrial Fibrillation (ICD10:  I48.0) The patient's CHA2DS2-VASc score is 7, indicating a 11.2% annual risk of stroke.    We will continue with conservative observation via pacemaker interrogations. If he has increased burden will require AAD. He is not a candidate for ablation at this time.  Secondary Hypercoagulable State (ICD10:  D68.69) The patient is at significant risk for stroke/thromboembolism based upon his CHA2DS2-VASc Score of 7.  Start Apixaban (Eliquis).  Continue Eliquis without interruption.  CBC today.   3. Obesity Body mass index is 48.82 kg/m. Lifestyle modification was discussed at length including regular exercise and weight reduction. Encouraged daily walking as tolerated.  4. Obstructive sleep apnea Compliant with CPAP   Follow up after October device check.   Lake Bells, PA-C Afib Clinic [facility] [address] [place], [place] [address] [phone] 08/13/2022 10:53 AM

## 2022-08-17 DIAGNOSIS — H905 Unspecified sensorineural hearing loss: Secondary | ICD-10-CM | POA: Diagnosis not present

## 2022-08-23 ENCOUNTER — Other Ambulatory Visit: Payer: Self-pay | Admitting: Family

## 2022-08-23 DIAGNOSIS — G8929 Other chronic pain: Secondary | ICD-10-CM

## 2022-08-23 DIAGNOSIS — E119 Type 2 diabetes mellitus without complications: Secondary | ICD-10-CM | POA: Diagnosis not present

## 2022-08-23 DIAGNOSIS — E118 Type 2 diabetes mellitus with unspecified complications: Secondary | ICD-10-CM | POA: Diagnosis not present

## 2022-08-23 DIAGNOSIS — Z933 Colostomy status: Secondary | ICD-10-CM | POA: Diagnosis not present

## 2022-08-23 DIAGNOSIS — Z794 Long term (current) use of insulin: Secondary | ICD-10-CM | POA: Diagnosis not present

## 2022-08-23 DIAGNOSIS — E1142 Type 2 diabetes mellitus with diabetic polyneuropathy: Secondary | ICD-10-CM

## 2022-08-23 DIAGNOSIS — K56609 Unspecified intestinal obstruction, unspecified as to partial versus complete obstruction: Secondary | ICD-10-CM | POA: Diagnosis not present

## 2022-08-24 NOTE — Telephone Encounter (Signed)
Patient has not been on office since Jan. 2024. Tramadol was last refilled 05/04/22. Treatment agreement on file and up to date.  Medications have been pended and sent to Richarda Blade, NP

## 2022-08-25 ENCOUNTER — Other Ambulatory Visit: Payer: Self-pay | Admitting: Family

## 2022-08-25 DIAGNOSIS — E118 Type 2 diabetes mellitus with unspecified complications: Secondary | ICD-10-CM | POA: Diagnosis not present

## 2022-08-25 DIAGNOSIS — K56609 Unspecified intestinal obstruction, unspecified as to partial versus complete obstruction: Secondary | ICD-10-CM | POA: Diagnosis not present

## 2022-08-25 DIAGNOSIS — Z794 Long term (current) use of insulin: Secondary | ICD-10-CM | POA: Diagnosis not present

## 2022-08-25 DIAGNOSIS — Z933 Colostomy status: Secondary | ICD-10-CM | POA: Diagnosis not present

## 2022-08-25 DIAGNOSIS — E119 Type 2 diabetes mellitus without complications: Secondary | ICD-10-CM | POA: Diagnosis not present

## 2022-08-25 DIAGNOSIS — G4733 Obstructive sleep apnea (adult) (pediatric): Secondary | ICD-10-CM | POA: Diagnosis not present

## 2022-08-27 NOTE — Telephone Encounter (Signed)
Patient medication has many High Risk Warnings. Medication pend and sent to PCP Ngetich, [name], NP for approval.

## 2022-08-29 DIAGNOSIS — Z933 Colostomy status: Secondary | ICD-10-CM | POA: Diagnosis not present

## 2022-08-29 DIAGNOSIS — K56609 Unspecified intestinal obstruction, unspecified as to partial versus complete obstruction: Secondary | ICD-10-CM | POA: Diagnosis not present

## 2022-08-29 DIAGNOSIS — Z794 Long term (current) use of insulin: Secondary | ICD-10-CM | POA: Diagnosis not present

## 2022-08-29 DIAGNOSIS — F0630 Mood disorder due to known physiological condition, unspecified: Secondary | ICD-10-CM | POA: Diagnosis not present

## 2022-08-29 DIAGNOSIS — E118 Type 2 diabetes mellitus with unspecified complications: Secondary | ICD-10-CM | POA: Diagnosis not present

## 2022-08-29 DIAGNOSIS — E119 Type 2 diabetes mellitus without complications: Secondary | ICD-10-CM | POA: Diagnosis not present

## 2022-09-05 ENCOUNTER — Other Ambulatory Visit: Payer: Self-pay | Admitting: Family

## 2022-09-05 ENCOUNTER — Other Ambulatory Visit: Payer: Self-pay

## 2022-09-05 DIAGNOSIS — I5022 Chronic systolic (congestive) heart failure: Secondary | ICD-10-CM

## 2022-09-05 DIAGNOSIS — Z933 Colostomy status: Secondary | ICD-10-CM | POA: Diagnosis not present

## 2022-09-05 DIAGNOSIS — Z794 Long term (current) use of insulin: Secondary | ICD-10-CM | POA: Diagnosis not present

## 2022-09-05 DIAGNOSIS — K56609 Unspecified intestinal obstruction, unspecified as to partial versus complete obstruction: Secondary | ICD-10-CM | POA: Diagnosis not present

## 2022-09-05 DIAGNOSIS — E119 Type 2 diabetes mellitus without complications: Secondary | ICD-10-CM | POA: Diagnosis not present

## 2022-09-05 DIAGNOSIS — E118 Type 2 diabetes mellitus with unspecified complications: Secondary | ICD-10-CM | POA: Diagnosis not present

## 2022-09-05 MED ORDER — DICLOFENAC SODIUM 1 % EX GEL: 2.0000 g | Freq: Four times a day (QID) | CUTANEOUS | 1 refills | Status: DC

## 2022-09-05 NOTE — Telephone Encounter (Signed)
Patient called and he states that medication Lasix is taken 2 tablets in the morning, and 2 tablets in the afternoon. He's aware the prescription is managed by another provider. Patient request refill on Voltaren Gel. Medication sent to requested pharmacy.

## 2022-09-05 NOTE — Telephone Encounter (Signed)
Please confirm directions on medication.

## 2022-09-05 NOTE — Telephone Encounter (Signed)
Please verify with patient dose and frequency.diuretic managed by cardiology.

## 2022-09-09 ENCOUNTER — Other Ambulatory Visit: Payer: Self-pay | Admitting: Family

## 2022-09-10 DIAGNOSIS — K56609 Unspecified intestinal obstruction, unspecified as to partial versus complete obstruction: Secondary | ICD-10-CM | POA: Diagnosis not present

## 2022-09-10 DIAGNOSIS — E119 Type 2 diabetes mellitus without complications: Secondary | ICD-10-CM | POA: Diagnosis not present

## 2022-09-10 DIAGNOSIS — Z933 Colostomy status: Secondary | ICD-10-CM | POA: Diagnosis not present

## 2022-09-10 DIAGNOSIS — E118 Type 2 diabetes mellitus with unspecified complications: Secondary | ICD-10-CM | POA: Diagnosis not present

## 2022-09-10 DIAGNOSIS — Z794 Long term (current) use of insulin: Secondary | ICD-10-CM | POA: Diagnosis not present

## 2022-09-14 ENCOUNTER — Ambulatory Visit (INDEPENDENT_AMBULATORY_CARE_PROVIDER_SITE_OTHER): Payer: Medicare PPO

## 2022-09-14 DIAGNOSIS — I442 Atrioventricular block, complete: Secondary | ICD-10-CM

## 2022-09-14 LAB — CUP PACEART REMOTE DEVICE CHECK
Battery Remaining Longevity: 90 mo
Battery Remaining Percentage: 90 %
Battery Voltage: 2.99 V
Brady Statistic AP VP Percent: 53 %
Brady Statistic AP VS Percent: 1 %
Brady Statistic AS VP Percent: 46 %
Brady Statistic AS VS Percent: 1 %
Brady Statistic RA Percent Paced: 51 %
Brady Statistic RV Percent Paced: 99 %
Date Time Interrogation Session: 20240726022359
Implantable Lead Connection Status: 753985
Implantable Lead Connection Status: 753985
Implantable Lead Implant Date: 20230728
Implantable Lead Implant Date: 20230728
Implantable Lead Location: 753859
Implantable Lead Location: 753860
Implantable Lead Model: 3830
Implantable Pulse Generator Implant Date: 20230728
Lead Channel Impedance Value: 460 Ohm
Lead Channel Impedance Value: 550 Ohm
Lead Channel Pacing Threshold Amplitude: 0.5 V
Lead Channel Pacing Threshold Amplitude: 1 V
Lead Channel Pacing Threshold Pulse Width: 0.5 ms
Lead Channel Pacing Threshold Pulse Width: 0.5 ms
Lead Channel Sensing Intrinsic Amplitude: 12 mV
Lead Channel Sensing Intrinsic Amplitude: 3.1 mV
Lead Channel Setting Pacing Amplitude: 1.5 V
Lead Channel Setting Pacing Amplitude: 2.5 V
Lead Channel Setting Pacing Pulse Width: 0.5 ms
Lead Channel Setting Sensing Sensitivity: 2.5 mV
Pulse Gen Model: 2272
Pulse Gen Serial Number: 8101667

## 2022-09-21 NOTE — Progress Notes (Signed)
Remote pacemaker transmission.   

## 2022-09-24 ENCOUNTER — Inpatient Hospital Stay (HOSPITAL_COMMUNITY): Payer: Medicare PPO

## 2022-09-24 ENCOUNTER — Inpatient Hospital Stay (HOSPITAL_COMMUNITY)
Admission: EM | Admit: 2022-09-24 | Discharge: 2022-09-27 | DRG: 394 | Disposition: A | Discharge status: Home Health | Payer: Medicare PPO | Attending: Internal Medicine | Admitting: Internal Medicine

## 2022-09-24 ENCOUNTER — Other Ambulatory Visit: Payer: Self-pay

## 2022-09-24 ENCOUNTER — Emergency Department (HOSPITAL_COMMUNITY): Payer: Medicare PPO

## 2022-09-24 DIAGNOSIS — Z833 Family history of diabetes mellitus: Secondary | ICD-10-CM

## 2022-09-24 DIAGNOSIS — E785 Hyperlipidemia, unspecified: Secondary | ICD-10-CM | POA: Diagnosis present

## 2022-09-24 DIAGNOSIS — E1169 Type 2 diabetes mellitus with other specified complication: Secondary | ICD-10-CM | POA: Diagnosis present

## 2022-09-24 DIAGNOSIS — E1140 Type 2 diabetes mellitus with diabetic neuropathy, unspecified: Secondary | ICD-10-CM | POA: Diagnosis present

## 2022-09-24 DIAGNOSIS — K439 Ventral hernia without obstruction or gangrene: Secondary | ICD-10-CM | POA: Diagnosis present

## 2022-09-24 DIAGNOSIS — Z993 Dependence on wheelchair: Secondary | ICD-10-CM | POA: Diagnosis not present

## 2022-09-24 DIAGNOSIS — J9611 Chronic respiratory failure with hypoxia: Secondary | ICD-10-CM | POA: Diagnosis present

## 2022-09-24 DIAGNOSIS — Z79899 Other long term (current) drug therapy: Secondary | ICD-10-CM

## 2022-09-24 DIAGNOSIS — I4819 Other persistent atrial fibrillation: Secondary | ICD-10-CM | POA: Diagnosis present

## 2022-09-24 DIAGNOSIS — K7689 Other specified diseases of liver: Secondary | ICD-10-CM | POA: Diagnosis not present

## 2022-09-24 DIAGNOSIS — R5381 Other malaise: Secondary | ICD-10-CM | POA: Diagnosis present

## 2022-09-24 DIAGNOSIS — E8809 Other disorders of plasma-protein metabolism, not elsewhere classified: Secondary | ICD-10-CM | POA: Diagnosis not present

## 2022-09-24 DIAGNOSIS — J90 Pleural effusion, not elsewhere classified: Secondary | ICD-10-CM | POA: Diagnosis not present

## 2022-09-24 DIAGNOSIS — Z91048 Other nonmedicinal substance allergy status: Secondary | ICD-10-CM

## 2022-09-24 DIAGNOSIS — I130 Hypertensive heart and chronic kidney disease with heart failure and stage 1 through stage 4 chronic kidney disease, or unspecified chronic kidney disease: Secondary | ICD-10-CM | POA: Diagnosis present

## 2022-09-24 DIAGNOSIS — Z6841 Body Mass Index (BMI) 40.0 and over, adult: Secondary | ICD-10-CM

## 2022-09-24 DIAGNOSIS — Z950 Presence of cardiac pacemaker: Secondary | ICD-10-CM | POA: Diagnosis not present

## 2022-09-24 DIAGNOSIS — D631 Anemia in chronic kidney disease: Secondary | ICD-10-CM | POA: Diagnosis present

## 2022-09-24 DIAGNOSIS — E66813 Obesity, class 3: Secondary | ICD-10-CM | POA: Diagnosis present

## 2022-09-24 DIAGNOSIS — I2510 Atherosclerotic heart disease of native coronary artery without angina pectoris: Secondary | ICD-10-CM | POA: Diagnosis present

## 2022-09-24 DIAGNOSIS — K436 Other and unspecified ventral hernia with obstruction, without gangrene: Principal | ICD-10-CM | POA: Diagnosis present

## 2022-09-24 DIAGNOSIS — N184 Chronic kidney disease, stage 4 (severe): Secondary | ICD-10-CM | POA: Diagnosis present

## 2022-09-24 DIAGNOSIS — I495 Sick sinus syndrome: Secondary | ICD-10-CM | POA: Diagnosis present

## 2022-09-24 DIAGNOSIS — G4733 Obstructive sleep apnea (adult) (pediatric): Secondary | ICD-10-CM | POA: Diagnosis not present

## 2022-09-24 DIAGNOSIS — Z794 Long term (current) use of insulin: Secondary | ICD-10-CM

## 2022-09-24 DIAGNOSIS — K7460 Unspecified cirrhosis of liver: Secondary | ICD-10-CM | POA: Diagnosis not present

## 2022-09-24 DIAGNOSIS — I517 Cardiomegaly: Secondary | ICD-10-CM | POA: Diagnosis not present

## 2022-09-24 DIAGNOSIS — Z8673 Personal history of transient ischemic attack (TIA), and cerebral infarction without residual deficits: Secondary | ICD-10-CM

## 2022-09-24 DIAGNOSIS — F32A Depression, unspecified: Secondary | ICD-10-CM | POA: Diagnosis present

## 2022-09-24 DIAGNOSIS — E1122 Type 2 diabetes mellitus with diabetic chronic kidney disease: Secondary | ICD-10-CM | POA: Diagnosis present

## 2022-09-24 DIAGNOSIS — E119 Type 2 diabetes mellitus without complications: Secondary | ICD-10-CM

## 2022-09-24 DIAGNOSIS — R140 Abdominal distension (gaseous): Secondary | ICD-10-CM | POA: Diagnosis not present

## 2022-09-24 DIAGNOSIS — I480 Paroxysmal atrial fibrillation: Secondary | ICD-10-CM | POA: Diagnosis present

## 2022-09-24 DIAGNOSIS — Z9049 Acquired absence of other specified parts of digestive tract: Secondary | ICD-10-CM

## 2022-09-24 DIAGNOSIS — K5669 Other partial intestinal obstruction: Secondary | ICD-10-CM | POA: Diagnosis not present

## 2022-09-24 DIAGNOSIS — K219 Gastro-esophageal reflux disease without esophagitis: Secondary | ICD-10-CM | POA: Diagnosis present

## 2022-09-24 DIAGNOSIS — Z818 Family history of other mental and behavioral disorders: Secondary | ICD-10-CM

## 2022-09-24 DIAGNOSIS — Z87891 Personal history of nicotine dependence: Secondary | ICD-10-CM

## 2022-09-24 DIAGNOSIS — K56609 Unspecified intestinal obstruction, unspecified as to partial versus complete obstruction: Principal | ICD-10-CM | POA: Diagnosis present

## 2022-09-24 DIAGNOSIS — F419 Anxiety disorder, unspecified: Secondary | ICD-10-CM | POA: Diagnosis present

## 2022-09-24 DIAGNOSIS — I10 Essential (primary) hypertension: Secondary | ICD-10-CM | POA: Diagnosis present

## 2022-09-24 DIAGNOSIS — Z955 Presence of coronary angioplasty implant and graft: Secondary | ICD-10-CM

## 2022-09-24 DIAGNOSIS — K9401 Colostomy hemorrhage: Secondary | ICD-10-CM

## 2022-09-24 DIAGNOSIS — E876 Hypokalemia: Secondary | ICD-10-CM | POA: Diagnosis present

## 2022-09-24 DIAGNOSIS — N2889 Other specified disorders of kidney and ureter: Secondary | ICD-10-CM | POA: Diagnosis not present

## 2022-09-24 DIAGNOSIS — I5022 Chronic systolic (congestive) heart failure: Secondary | ICD-10-CM | POA: Diagnosis present

## 2022-09-24 DIAGNOSIS — N183 Chronic kidney disease, stage 3 unspecified: Secondary | ICD-10-CM | POA: Diagnosis not present

## 2022-09-24 DIAGNOSIS — Z4682 Encounter for fitting and adjustment of non-vascular catheter: Secondary | ICD-10-CM | POA: Diagnosis not present

## 2022-09-24 DIAGNOSIS — I959 Hypotension, unspecified: Secondary | ICD-10-CM | POA: Diagnosis not present

## 2022-09-24 DIAGNOSIS — Z9981 Dependence on supplemental oxygen: Secondary | ICD-10-CM

## 2022-09-24 DIAGNOSIS — Z7401 Bed confinement status: Secondary | ICD-10-CM | POA: Diagnosis not present

## 2022-09-24 DIAGNOSIS — E1165 Type 2 diabetes mellitus with hyperglycemia: Secondary | ICD-10-CM | POA: Diagnosis present

## 2022-09-24 DIAGNOSIS — R1084 Generalized abdominal pain: Secondary | ICD-10-CM | POA: Diagnosis not present

## 2022-09-24 DIAGNOSIS — Z7901 Long term (current) use of anticoagulants: Secondary | ICD-10-CM

## 2022-09-24 DIAGNOSIS — Z933 Colostomy status: Secondary | ICD-10-CM

## 2022-09-24 DIAGNOSIS — Z7984 Long term (current) use of oral hypoglycemic drugs: Secondary | ICD-10-CM

## 2022-09-24 DIAGNOSIS — K6389 Other specified diseases of intestine: Secondary | ICD-10-CM | POA: Diagnosis not present

## 2022-09-24 LAB — COMPREHENSIVE METABOLIC PANEL
ALT: 16 U/L (ref 0–44)
AST: 15 U/L (ref 15–41)
Albumin: 3.5 g/dL (ref 3.5–5.0)
Alkaline Phosphatase: 85 U/L (ref 38–126)
Anion gap: 9 (ref 5–15)
BUN: 28 mg/dL — ABNORMAL HIGH (ref 8–23)
CO2: 21 mmol/L — ABNORMAL LOW (ref 22–32)
Calcium: 8.5 mg/dL — ABNORMAL LOW (ref 8.9–10.3)
Chloride: 102 mmol/L (ref 98–111)
Creatinine, Ser: 1.83 mg/dL — ABNORMAL HIGH (ref 0.61–1.24)
GFR, Estimated: 39 mL/min — ABNORMAL LOW (ref >60)
Glucose, Bld: 391 mg/dL — ABNORMAL HIGH (ref 70–99)
Potassium: 4.3 mmol/L (ref 3.5–5.1)
Sodium: 132 mmol/L — ABNORMAL LOW (ref 135–145)
Total Bilirubin: 0.6 mg/dL (ref 0.3–1.2)
Total Protein: 6.9 g/dL (ref 6.5–8.1)

## 2022-09-24 LAB — URINALYSIS, ROUTINE W REFLEX MICROSCOPIC
Bilirubin Urine: NEGATIVE
Glucose, UA: >=500 mg/dL — AB
Hgb urine dipstick: NEGATIVE
Ketones, ur: NEGATIVE mg/dL
Leukocytes,Ua: NEGATIVE
Nitrite: NEGATIVE
Protein, ur: NEGATIVE mg/dL
Specific Gravity, Urine: 1.03 (ref 1.005–1.030)
pH: 5 (ref 5.0–8.0)

## 2022-09-24 LAB — CBG MONITORING, ED: Glucose-Capillary: 313 mg/dL — ABNORMAL HIGH (ref 70–99)

## 2022-09-24 LAB — CBC WITH DIFFERENTIAL/PLATELET
Abs Immature Granulocytes: 0.02 10*3/uL (ref 0.00–0.07)
Basophils Absolute: 0 10*3/uL (ref 0.0–0.1)
Basophils Relative: 1 %
Eosinophils Absolute: 0.2 10*3/uL (ref 0.0–0.5)
Eosinophils Relative: 3 %
HCT: 36.9 % — ABNORMAL LOW (ref 39.0–52.0)
Hemoglobin: 11 g/dL — ABNORMAL LOW (ref 13.0–17.0)
Immature Granulocytes: 0 %
Lymphocytes Relative: 11 %
Lymphs Abs: 0.8 10*3/uL (ref 0.7–4.0)
MCH: 26.8 pg (ref 26.0–34.0)
MCHC: 29.8 g/dL — ABNORMAL LOW (ref 30.0–36.0)
MCV: 90 fL (ref 80.0–100.0)
Monocytes Absolute: 0.8 10*3/uL (ref 0.1–1.0)
Monocytes Relative: 12 %
Neutro Abs: 5.2 10*3/uL (ref 1.7–7.7)
Neutrophils Relative %: 73 %
Platelets: 250 10*3/uL (ref 150–400)
RBC: 4.1 MIL/uL — ABNORMAL LOW (ref 4.22–5.81)
RDW: 17.9 % — ABNORMAL HIGH (ref 11.5–15.5)
WBC: 7.1 10*3/uL (ref 4.0–10.5)
nRBC: 0 % (ref 0.0–0.2)

## 2022-09-24 LAB — LIPASE, BLOOD: Lipase: 64 U/L — ABNORMAL HIGH (ref 11–51)

## 2022-09-24 LAB — HEMOGLOBIN A1C
Hgb A1c MFr Bld: 10.1 % — ABNORMAL HIGH (ref 4.8–5.6)
Mean Plasma Glucose: 243.17 mg/dL

## 2022-09-24 LAB — PROTIME-INR
INR: 1.1 (ref 0.8–1.2)
Prothrombin Time: 14.4 seconds (ref 11.4–15.2)

## 2022-09-24 LAB — GLUCOSE, CAPILLARY
Glucose-Capillary: 208 mg/dL — ABNORMAL HIGH (ref 70–99)
Glucose-Capillary: 194 mg/dL — ABNORMAL HIGH (ref 70–99)
Glucose-Capillary: 175 mg/dL — ABNORMAL HIGH (ref 70–99)

## 2022-09-24 LAB — APTT: aPTT: 31 seconds (ref 24–36)

## 2022-09-24 LAB — HEPARIN LEVEL (UNFRACTIONATED): Heparin Unfractionated: 1.03 IU/mL — ABNORMAL HIGH (ref 0.30–0.70)

## 2022-09-24 MED ORDER — HEPARIN (PORCINE) 25000 UT/250ML-% IV SOLN: 1700.0000 [IU]/h | INTRAVENOUS | Status: DC

## 2022-09-24 MED ORDER — ONDANSETRON HCL 4 MG PO TABS: 4.0000 mg | ORAL_TABLET | Freq: Four times a day (QID) | ORAL | Status: DC | PRN

## 2022-09-24 MED ORDER — PANTOPRAZOLE SODIUM 40 MG IV SOLR
40.0000 mg | INTRAVENOUS | Status: DC
  Administered 2022-09-24 – 2022-09-26 (×3): 40 mg via INTRAVENOUS
  Filled 2022-09-24 (×4): qty 10

## 2022-09-24 MED ORDER — METHOCARBAMOL 500 MG PO TABS: 500.0000 mg | ORAL_TABLET | Freq: Three times a day (TID) | ORAL | Status: DC | PRN

## 2022-09-24 MED ORDER — HYDROMORPHONE HCL 1 MG/ML IJ SOLN
0.5000 mg | Freq: Once | INTRAMUSCULAR | Status: AC
  Administered 2022-09-24: 0.5 mg via INTRAVENOUS
  Filled 2022-09-24: qty 1

## 2022-09-24 MED ORDER — IOHEXOL 300 MG/ML  SOLN
100.0000 mL | Freq: Once | INTRAMUSCULAR | Status: AC | PRN
  Administered 2022-09-24: 100 mL via INTRAVENOUS

## 2022-09-24 MED ORDER — ONDANSETRON HCL 4 MG/2ML IJ SOLN
4.0000 mg | Freq: Four times a day (QID) | INTRAMUSCULAR | Status: DC | PRN
  Administered 2022-09-24 – 2022-09-25 (×2): 4 mg via INTRAVENOUS
  Filled 2022-09-24 (×2): qty 2

## 2022-09-24 MED ORDER — APIXABAN 5 MG PO TABS
5.0000 mg | ORAL_TABLET | Freq: Two times a day (BID) | ORAL | Status: DC
Start: 2022-09-24 — End: 2022-09-24

## 2022-09-24 MED ORDER — ONDANSETRON HCL 4 MG/2ML IJ SOLN
4.0000 mg | Freq: Once | INTRAMUSCULAR | Status: AC
  Administered 2022-09-24: 4 mg via INTRAVENOUS
  Filled 2022-09-24: qty 2

## 2022-09-24 MED ORDER — INSULIN GLARGINE-YFGN 100 UNIT/ML ~~LOC~~ SOLN
25.0000 [IU] | Freq: Every day | SUBCUTANEOUS | Status: DC
  Administered 2022-09-24 – 2022-09-25 (×2): 25 [IU] via SUBCUTANEOUS
  Filled 2022-09-24 (×3): qty 0.25

## 2022-09-24 MED ORDER — ACETAMINOPHEN 650 MG RE SUPP: 650.0000 mg | Freq: Four times a day (QID) | RECTAL | Status: DC | PRN

## 2022-09-24 MED ORDER — DIATRIZOATE MEGLUMINE & SODIUM 66-10 % PO SOLN
90.0000 mL | Freq: Once | ORAL | Status: AC
  Administered 2022-09-24: 90 mL via NASOGASTRIC
  Filled 2022-09-24: qty 90

## 2022-09-24 MED ORDER — HYDRALAZINE HCL 20 MG/ML IJ SOLN
10.0000 mg | Freq: Four times a day (QID) | INTRAMUSCULAR | Status: DC | PRN
  Administered 2022-09-24: 10 mg via INTRAVENOUS
  Filled 2022-09-24: qty 1

## 2022-09-24 MED ORDER — SODIUM CHLORIDE 0.9 % IV SOLN: INTRAVENOUS | Status: AC

## 2022-09-24 MED ORDER — ENOXAPARIN SODIUM 40 MG/0.4ML IJ SOSY: 40.0000 mg | PREFILLED_SYRINGE | INTRAMUSCULAR | Status: DC

## 2022-09-24 MED ORDER — TRAZODONE HCL 50 MG PO TABS: 50.0000 mg | ORAL_TABLET | Freq: Every evening | ORAL | Status: DC | PRN

## 2022-09-24 MED ORDER — HYDROMORPHONE HCL 1 MG/ML IJ SOLN
1.0000 mg | Freq: Once | INTRAMUSCULAR | Status: AC
  Administered 2022-09-24: 1 mg via INTRAVENOUS
  Filled 2022-09-24: qty 1

## 2022-09-24 MED ORDER — INSULIN ASPART 100 UNIT/ML IJ SOLN
0.0000 [IU] | Freq: Every day | INTRAMUSCULAR | Status: DC
  Filled 2022-09-24: qty 0.05

## 2022-09-24 MED ORDER — INSULIN ASPART 100 UNIT/ML IJ SOLN
0.0000 [IU] | Freq: Three times a day (TID) | INTRAMUSCULAR | Status: DC
  Administered 2022-09-24: 11 [IU] via SUBCUTANEOUS
  Administered 2022-09-24: 5 [IU] via SUBCUTANEOUS
  Administered 2022-09-24: 3 [IU] via SUBCUTANEOUS
  Administered 2022-09-25 (×3): 5 [IU] via SUBCUTANEOUS
  Administered 2022-09-26: 3 [IU] via SUBCUTANEOUS
  Administered 2022-09-26 (×2): 5 [IU] via SUBCUTANEOUS
  Administered 2022-09-27: 3 [IU] via SUBCUTANEOUS
  Filled 2022-09-24: qty 0.15

## 2022-09-24 MED ORDER — DULOXETINE HCL 30 MG PO CPEP
60.0000 mg | ORAL_CAPSULE | Freq: Every day | ORAL | Status: DC
  Administered 2022-09-24 – 2022-09-27 (×4): 60 mg via ORAL
  Filled 2022-09-24 (×4): qty 2

## 2022-09-24 MED ORDER — HYDROMORPHONE HCL 1 MG/ML IJ SOLN
0.5000 mg | INTRAMUSCULAR | Status: DC | PRN
  Administered 2022-09-24 – 2022-09-26 (×10): 1 mg via INTRAVENOUS
  Filled 2022-09-24 (×10): qty 1

## 2022-09-24 MED ORDER — TIZANIDINE HCL 4 MG PO TABS: 2.0000 mg | ORAL_TABLET | ORAL | Status: DC | PRN

## 2022-09-24 MED ORDER — HEPARIN (PORCINE) 25000 UT/250ML-% IV SOLN
2050.0000 [IU]/h | INTRAVENOUS | Status: DC
  Administered 2022-09-24: 1700 [IU]/h via INTRAVENOUS
  Administered 2022-09-25: 2050 [IU]/h via INTRAVENOUS
  Filled 2022-09-24 (×2): qty 250

## 2022-09-24 MED ORDER — ISOSORBIDE MONONITRATE ER 60 MG PO TB24
60.0000 mg | ORAL_TABLET | Freq: Every day | ORAL | Status: DC
  Administered 2022-09-24 – 2022-09-27 (×4): 60 mg via ORAL
  Filled 2022-09-24 (×4): qty 1

## 2022-09-24 MED ORDER — TOPIRAMATE 25 MG PO TABS
25.0000 mg | ORAL_TABLET | Freq: Every day | ORAL | Status: DC
  Administered 2022-09-24 – 2022-09-27 (×4): 25 mg via ORAL
  Filled 2022-09-24 (×4): qty 1

## 2022-09-24 MED ORDER — ROPINIROLE HCL 1 MG PO TABS
2.0000 mg | ORAL_TABLET | Freq: Every day | ORAL | Status: DC
  Administered 2022-09-25 – 2022-09-26 (×3): 2 mg via ORAL
  Filled 2022-09-24 (×4): qty 2

## 2022-09-24 MED ORDER — ALBUTEROL SULFATE (2.5 MG/3ML) 0.083% IN NEBU: 2.5000 mg | INHALATION_SOLUTION | RESPIRATORY_TRACT | Status: DC | PRN

## 2022-09-24 MED ORDER — ACETAMINOPHEN 325 MG PO TABS
650.0000 mg | ORAL_TABLET | Freq: Four times a day (QID) | ORAL | Status: DC | PRN
  Administered 2022-09-25 – 2022-09-27 (×2): 650 mg via ORAL
  Filled 2022-09-24 (×3): qty 2

## 2022-09-24 NOTE — ED Provider Notes (Signed)
[facility]-EMERGENCY DEPT [facility] Emergency Department Provider Note MRN:  [id number]  Arrival date & time: 09/24/22     Chief Complaint   Abdominal Pain   History of Present Illness   Jesse [name] is a 72 y.o. year-old male presents to the ED with chief complaint of abdominal pain that started around 11 PM last night.  Reports history of prior partial colectomy, abdominal hernias, multiple prior small bowel obstructions.  States that this feels similar to previous small bowel obstruction.  He denies any vomiting.  He states that his pain is increasing.  States that his abdomen is becoming firm, which is also normal for him when he has an obstruction.  He denies fevers or chills.  He has had normal output from his colostomy.Marland Kitchen  History provided by patient.   Review of Systems  Pertinent positive and negative review of systems noted in HPI.    Physical Exam   Vitals:   09/24/22 0149  BP: (!) 155/71  Pulse: 71  Resp: 20  Temp: 97.9 F (36.6 C)  SpO2: 94%    CONSTITUTIONAL:  chronically ill-appearing, NAD NEURO:  Alert and oriented x 3, CN 3-12 grossly intact EYES:  eyes equal and reactive ENT/NECK:  Supple, no stridor  CARDIO:  normal rate, regular rhythm, appears well-perfused  PULM:  No respiratory distress, CTAB GI/GU:  non-distended, colostomy in right lower quadrant, 2 large ventral hernias, abdomen is firm and moderately tender MSK/SPINE:  No gross deformities, no edema, moves all extremities  SKIN:  no rash, atraumatic   *Additional and/or pertinent findings included in MDM below  Diagnostic and Interventional Summary    EKG Interpretation Date/Time:    Ventricular Rate:    PR Interval:    QRS Duration:    QT Interval:    QTC Calculation:   R Axis:      Text Interpretation:         Labs Reviewed  COMPREHENSIVE METABOLIC PANEL - Abnormal; Notable for the following components:      Result Value   Sodium 132 (*)    CO2 21 (*)    Glucose, Bld  391 (*)    BUN 28 (*)    Creatinine, Ser 1.83 (*)    Calcium 8.5 (*)    GFR, Estimated 39 (*)    All other components within normal limits  LIPASE, BLOOD - Abnormal; Notable for the following components:   Lipase 64 (*)    All other components within normal limits  CBC WITH DIFFERENTIAL/PLATELET - Abnormal; Notable for the following components:   RBC 4.10 (*)    Hemoglobin 11.0 (*)    HCT 36.9 (*)    MCHC 29.8 (*)    RDW 17.9 (*)    All other components within normal limits  URINALYSIS, ROUTINE W REFLEX MICROSCOPIC    CT ABDOMEN PELVIS W CONTRAST  Final Result      Medications  HYDROmorphone (DILAUDID) injection 1 mg (has no administration in time range)  HYDROmorphone (DILAUDID) injection 0.5 mg (0.5 mg Intravenous Given 09/24/22 0201)  ondansetron (ZOFRAN) injection 4 mg (4 mg Intravenous Given 09/24/22 0201)  iohexol (OMNIPAQUE) 300 MG/ML solution 100 mL (100 mLs Intravenous Contrast Given 09/24/22 0238)     Procedures  /  Critical Care Procedures  ED Course and Medical Decision Making  I have reviewed the triage vital signs, the nursing notes, and pertinent available records from the EMR.  Social Determinants Affecting Complexity of Care: Patient has no clinically significant social  determinants affecting this chief complaint..   ED Course:    Medical Decision Making Patient here with abdominal pain.  History of prior small bowel obstruction.  States this feels similar.  Has large ventral and spigelian hernias.  Will check labs and CT.  Will treat pain.  CT notable for small bowel obstruction.  I discussed the case with Dr. Mariana Kaufman as below.  Will admit patient to hospitalist.  Patient has had similar in the past and has improved and had resolution with NG tube.  Will place NG tube now.  Amount and/or Complexity of Data Reviewed Labs: ordered.    Details: Labs are about baseline for patient, no significant leukocytosis, mild anemia at 11.0 No significant electrolyte  derangement Radiology: ordered.  Risk Prescription drug management. Decision regarding hospitalization.         Consultants: I consulted with Hospitalist, Dr. Janalyn Shy, who is appreciated for admitting. and I consulted with Dr. Luisa Hart, who recommends NG tube and will have patient seen in the AM.   Treatment and Plan: Patient's exam and diagnostic results are concerning for SBO.  Feel that patient will need admission to the hospital for further treatment and evaluation.    Final Clinical Impressions(s) / ED Diagnoses     ICD-10-CM   1. SBO (small bowel obstruction) (HCC)  K56.609     2. 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

## 2022-09-24 NOTE — Progress Notes (Addendum)
Attempted to place patient on CPAP per pt request despite the NG tube in place. After a few minutes the patient stated he could not wear it because it was making his stomach hurt. RN notified. Hospital Resmed in room with pt mask and hose on stand by.

## 2022-09-24 NOTE — Inpatient Diabetes Management (Signed)
Inpatient Diabetes Program Recommendations  AACE/ADA: New Consensus Statement on Inpatient Glycemic Control (2015)  Target Ranges:  Prepandial:   less than 140 mg/dL      Peak postprandial:   less than 180 mg/dL (1-2 hours)      Critically ill patients:  140 - 180 mg/dL   Lab Results  Component Value Date   GLUCAP 313 (H) 09/24/2022   HGBA1C 10.1 (H) 09/24/2022    Review of Glycemic Control  Diabetes history: DM2 Outpatient Diabetes medications: Jardiance 25 mg every day, Lantus 50 at bedtime, Victoza 1.8 mg QD Current orders for Inpatient glycemic control: Semglee 25 at bedtime, Novolog 0-15 TID with meals and 0-5 HS, Victoza 1.8 mg every day  CBGs 391, 313 mg/dL today Did not receive Lantus dose last night HgbA1C 10.1%  Inpatient Diabetes Program Recommendations:    Consider increasing Novolog to 0-20 Q4H while NPO.  If FBS > 180 on 8/6, increase Semglee to 35 units at bedtime  Will follow.   Thank you. Ailene Ards, RD, LDN, CDCES Inpatient Diabetes Coordinator [phone]

## 2022-09-24 NOTE — Progress Notes (Signed)
Chaplain engaged in an initial visit with Maisie Fus. Chaplain received consult for Advanced Directives but Rashiem voiced he already has documents in place. Chaplain then worked to establish relationship and rapport. Malcomb shared about his family, his love for his grandchildren, and his healthcare journey.   Chaplain offered reflective listening and compassionate presence.    09/24/22 1400  Spiritual Encounters  Type of Visit Initial  Care provided to: Patient  Reason for visit Routine spiritual support  Spiritual Framework  Presenting Themes Community and relationships;Courage hope and growth;Meaning/purpose/sources of inspiration  Interventions  Spiritual Care Interventions Made Established relationship of care and support;Reflective listening;Compassionate presence  Intervention Outcomes  Outcomes Awareness of support;Connection to spiritual care  Advance Directives (For Healthcare)  Does Patient Have a Medical Advance Directive? Yes  Does patient want to make changes to medical advance directive? No - Patient declined  Type of Estate agent of [place];Living will  Copy of Healthcare Power of Attorney in Chart? No - copy requested  Copy of Living Will in Chart? No - copy requested  Mental Health Advance Directives  Does Patient Have a Mental Health Advance Directive? No  Would patient like information on creating a mental health advance directive? Yes (Inpatient - patient requests chaplain consult to create a mental health advance directive)

## 2022-09-24 NOTE — Consult Note (Signed)
Jesse [name] [birth year]  [id number].    Requesting MD: Roxy Horseman, PA-C Chief Complaint/Reason for Consult: SBO  HPI: Jesse [name] is a 72 y.o. 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 Harlan Stains in [facility], he was left open and went back for washout and closure the following day. His symptoms started last night with pain around his central and left side with associated nausea and decreased ostomy output. No vomiting. He does have some air and ostomy output this am.  This feels very similar to his sbo's in the past that have resolved with conservative management.    In the ED he was afebrile without tachycardia or systolic hypotension. WBC 7.1. CT scan with Multiple dilated loops of small bowel in the abdomen measuring up to 5.3 cm with suspected transition points in complex ventral and left spigelian large hernias, suspicious for bowel obstruction.    Patient being admitted to the medical service. General surgery asked to see. NGT placed - he reports it has been advanced since placement.    Lives at [facility]  Medical sales representative wheelchair for mobility No alcohol use Quit smoking in 1999 Retired Scientist, research (life sciences)  ROS: ROS As above, see hpi  Family History  Problem Relation Age of Onset   Diabetes Brother    Bipolar disorder Daughter    Anxiety disorder Son     Past Medical History:  Diagnosis Date   Alcohol use disorder in remission 1999   Anxiety and depression    At high risk for falls    Former  cigarette smoker 1999   Generalized anxiety disorder    Hard of hearing    Hyperlipidemia due to type 2 diabetes mellitus (HCC)    Hypertension    Morbid obesity (HCC)    Neuropathy    O2 dependent    Risk for falls    Sleep apnea    Type 2 diabetes mellitus (HCC)    Ventral hernia with bowel obstruction    Per Hospital Encounter on 05/21/22    Past Surgical History:  Procedure Laterality Date   APPENDECTOMY  1980   IR THORACENTESIS ASP PLEURAL SPACE W/IMG GUIDE  05/18/2021   IR THORACENTESIS ASP PLEURAL SPACE W/IMG GUIDE  10/06/2021   KNEE SURGERY Right    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 His smoking use included cigarettes. He has never used smokeless tobacco. He reports that he does not currently use alcohol. He reports that he does not use drugs.  Allergies:  Allergies  Allergen Reactions   Tape Rash    If left on for a long time    (Not in a hospital admission)    Physical Exam: Blood pressure (!) 182/97, pulse 70, temperature 97.8 F (36.6 C), temperature source Oral, resp. rate 18, SpO2 96%. General: chronically ill appearing male who is laying in bed in NAD  Heart: Reg rate Lungs: Respiratory effort nonlabored on room air Abd: Obese, soft, large ventral hernia in the central/epigastric region of his abdomen that is soft but is not fully reducible/ no overlying skin changes. He does have some mild ttp over this. Left sided, more lower abdominal hernia that is also soft but not reducible/ no overlying skin changes but with some mild ttp. No rigidity or guarding. Ostomy bag appliance prevents visualization of stoma - there is a small amount of air and liquid stool in bag.   Results for orders placed or performed during the hospital encounter of 09/24/22 (from the past  48 hour(s))  Comprehensive metabolic panel     Status: Abnormal   Collection Time: 09/24/22  1:55 AM  Result Value Ref Range   Sodium 132 (L) 135 - 145 mmol/L   Potassium 4.3 3.5 - 5.1 mmol/L   Chloride 102 98 - 111 mmol/L   CO2 21 (L) 22 - 32 mmol/L   Glucose, Bld 391 (H) 70 - 99 mg/dL    Comment: Glucose reference range applies only to samples taken after fasting for at least 8 hours.   BUN 28 (H) 8 - 23 mg/dL   Creatinine, Ser 4.09 (H) 0.61 - 1.24 mg/dL   Calcium 8.5 (L) 8.9 - 10.3 mg/dL   Total Protein 6.9 6.5 - 8.1 g/dL   Albumin 3.5 3.5 - 5.0 g/dL   AST 15 15 - 41 U/L   ALT 16 0 - 44 U/L   Alkaline Phosphatase 85 38 - 126 U/L   Total Bilirubin 0.6 0.3 - 1.2 mg/dL   GFR, Estimated 39 (L) >60 mL/min    Comment: (NOTE) Calculated using the CKD-EPI Creatinine Equation (2021)    Anion gap 9 5 - 15    Comment: Performed at [facility], [address] [place], [place] [address]  Lipase, blood     Status: Abnormal   Collection Time: 09/24/22  1:55 AM  Result Value Ref Range   Lipase 64 (H) 11 - 51 U/L    Comment: Performed at [facility], [address] [place], [place] [address]  CBC with Diff     Status: Abnormal   Collection Time: 09/24/22  1:55 AM  Result Value Ref Range   WBC 7.1 4.0 - 10.5 K/uL   RBC 4.10 (L) 4.22 - 5.81 MIL/uL   Hemoglobin 11.0 (L) 13.0 - 17.0 g/dL   HCT 56.2 (L) 13.0 - 86.5 %   MCV 90.0 80.0 - 100.0 fL   MCH 26.8 26.0 - 34.0 pg   MCHC 29.8 (L) 30.0 - 36.0 g/dL   RDW 78.4 (H) 69.6 - 29.5 %   Platelets 250 150 - 400 K/uL   nRBC 0.0 0.0 - 0.2 %   Neutrophils Relative % 73 %   Neutro Abs 5.2 1.7 - 7.7 K/uL   Lymphocytes Relative 11 %   Lymphs Abs 0.8 0.7 - 4.0 K/uL   Monocytes Relative 12 %   Monocytes Absolute 0.8 0.1 - 1.0 K/uL   Eosinophils Relative 3 %   Eosinophils Absolute 0.2 0.0 - 0.5 K/uL   Basophils Relative 1 %   Basophils Absolute 0.0 0.0 - 0.1 K/uL   Immature Granulocytes 0 %   Abs Immature  Granulocytes 0.02 0.00 - 0.07 K/uL    Comment: Performed at [facility], [address], [place], [place] [address]  Urinalysis, Routine w reflex microscopic -Urine, Clean Catch     Status:  Abnormal   Collection Time: 09/24/22  5:27 AM  Result Value Ref Range   Color, Urine STRAW (A) YELLOW   APPearance CLEAR CLEAR   Specific Gravity, Urine 1.030 1.005 - 1.030   pH 5.0 5.0 - 8.0   Glucose, UA >=500 (A) NEGATIVE mg/dL   Hgb urine dipstick NEGATIVE NEGATIVE   Bilirubin Urine NEGATIVE NEGATIVE   Ketones, ur NEGATIVE NEGATIVE mg/dL   Protein, ur NEGATIVE NEGATIVE mg/dL   Nitrite NEGATIVE NEGATIVE   Leukocytes,Ua NEGATIVE NEGATIVE   RBC / HPF 0-5 0 - 5 RBC/hpf   WBC, UA 0-5 0 - 5 WBC/hpf   Bacteria, UA RARE (A) NONE SEEN   Squamous Epithelial / HPF 0-5 0 - 5 /HPF   Mucus PRESENT     Comment: Performed at [facility], [address] [place], [place] [address]   DG Abd Portable 1 View  Result Date: 09/24/2022 CLINICAL DATA:  72 year old male status post nasogastric tube placement. EXAM: PORTABLE ABDOMEN - 1 VIEW COMPARISON:  Abdominal radiograph 05/22/2022. FINDINGS: Nasogastric tube extends into the proximal stomach with side port just distal to the gastroesophageal junction. The lower abdomen is incompletely imaged. In the visualized upper abdomen, bowel-gas pattern is unremarkable. Cardiomegaly. Pacemaker leads noted. IMPRESSION: 1. Nasogastric tube is in the proximal stomach and could be advanced approximately 10 cm for more optimal placement. Electronically Signed   By: Trudie Reed M.D.   On: 09/24/2022 05:32   CT ABDOMEN PELVIS W CONTRAST  Result Date: 09/24/2022 CLINICAL DATA:  Abdominal pain.  Bowel obstruction suspected. EXAM: CT ABDOMEN AND PELVIS WITH CONTRAST TECHNIQUE: Multidetector CT imaging of the abdomen and pelvis was performed using the standard protocol following bolus administration of intravenous contrast. RADIATION DOSE  REDUCTION: This exam was performed according to the departmental dose-optimization program which includes automated exposure control, adjustment of the mA and/or kV according to patient size and/or use of iterative reconstruction technique. CONTRAST:  OMNIPAQUE IOHEXOL 300 MG/ML  SOLN COMPARISON:  05/21/2022. FINDINGS: Lower chest: A small pericardial effusion is noted. Pacemaker leads are seen in the heart. There is a small left pleural effusion with atelectasis or infiltrate. Mild atelectasis is noted at the right lung base. Hepatobiliary: The liver has a nodular contour, compatible with underlying cirrhosis. Subcentimeter hypodensity is present in the left lobe of the liver, suggesting cyst. No biliary ductal dilatation. The gallbladder is without stones. Pancreas: Unremarkable. No pancreatic ductal dilatation or surrounding inflammatory changes. Spleen: Normal in size without focal abnormality. Adrenals/Urinary Tract: The adrenal glands are within normal limits. Renal cortical thinning is noted bilaterally. There is a masslike region in the lower pole of the left kidney measuring 4.3 cm, increased in size from the prior exam. No renal calculus or hydronephrosis. The bladder is unremarkable. Stomach/Bowel: There is a large complex ventral abdominal wall hernia in the midline containing the stomach, small bowel, and colon. Appendix is not seen. Mildly distended loops of small bowel are noted in the abdomen measuring up to 3.6 cm with air-fluid levels. There is a suspected transition point in the ventral abdominal wall hernia, not well delineated due to bowel clumping and field-of-view, however both distended and a collapsed loops of small bowel in the hernia sac. There is a left lower quadrant broad-based spigelian hernia with dilatation of a loop small bowel proximal to the efferent loop measuring 5.3 cm with transition point along the medial edge of the hernia. The distal small bowel is completely  compressed. No free air  or pneumatosis. A right lower quadrant colostomy is noted. Vascular/Lymphatic: Aortic atherosclerosis. No enlarged abdominal or pelvic lymph nodes. Reproductive: Prostate is unremarkable. Other: Small amount of free fluid is noted in the hernia sac in the midline. Musculoskeletal: No acute osseous abnormality. IMPRESSION: 1. Multiple dilated loops of small bowel in the abdomen measuring up to 5.3 cm with suspected transition points in complex ventral and left spigelian large hernias, suspicious for bowel obstruction as detailed above. Surgical consultation is recommended. 2. Left renal mass measuring 4.3 cm, increased in size from the prior exam and concerning for malignancy. 3. Small left pleural effusion with atelectasis or infiltrate. 4. Morphologic changes of cirrhosis. 5. Aortic atherosclerosis. Critical Value/emergent results were called by telephone at the time of interpretation on 09/24/2022 at 3:41 am to provider Roxy Horseman , who verbally acknowledged these results. Electronically Signed   By: Thornell Sartorius M.D.   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 Harlan Stains in [facility], he was left open and went back for washout and closure the following day - HDS without fever, tachycardia or hypotension. WBC wnl. No peritonitis on exam. No indication for acute surgical intervention.  - Cont NPO/NGT to LIWS. Will get f/u xray to confirm placement after advancement of NGT.  - Start SBO protocol - Hopefully patient will improve with conservative management. If patient fails to improve with conservative management, they may require exploratory surgery during admission. Suspect patient would be high risk for surgery given  MMP. Would ask TRH to optimize him incase he needs surgery during admission.  - Agree with medical admission. We will follow with you.   FEN - IVF, NPO/NGT to LIWS. Keep K > 4 and Mg > 2 for bowel function ID - none VTE - SCDs, okay for heparin gtt or therapeutic lovenox. 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  Jacinto Halim, [facility] 09/24/2022, 7:40 AM Please see Amion for pager number during day hours 7:00am-4:30pm

## 2022-09-24 NOTE — ED Notes (Signed)
ED TO INPATIENT HANDOFF REPORT  Name/Age/Gender Jesse [name] 72 y.o. male  Code Status    Code Status Orders  (From admission, onward)           Start     Ordered   09/24/22 0755  Full code  Continuous       Question:  By:  Answer:  Consent: discussion documented in EHR   09/24/22 0755           Code Status History     Date Active Date Inactive Code Status Order ID Comments User Context   05/21/2022 0923 05/24/2022 0226 Full Code [id number]  Jonah Blue, MD Inpatient   02/09/2022 0421 02/13/2022 2100 Full Code [id number]  Carollee Herter, DO ED   02/09/2022 0406 02/09/2022 0421 Full Code [id number]  Carollee Herter, DO ED   11/01/2021 1232 11/02/2021 2214 Full Code [id number]  Orland Mustard, MD Inpatient   09/15/2021 1207 09/16/2021 1906 Full Code [id number]  Duke Salvia, MD Inpatient   06/30/2021 1608 09/15/2021 0714 Full Code [id number]  Ngetich, [name], NP Outpatient   05/20/2021 0857 05/21/2021 2046 DNR [id number]  Zannie Cove, MD Inpatient   05/13/2021 1736 05/20/2021 0857 Full Code [id number]  Emeline General, MD Inpatient       Home/SNF/Other Skilled nursing facility  Chief Complaint SBO (small bowel obstruction) (HCC) [K56.609]  Level of Care/Admitting Diagnosis ED Disposition     ED Disposition  Admit   Condition  --   Comment  Hospital Area: [facility] [[id number]]  Level of Care: Med-Surg [16]  May admit patient to [facility] or [facility] if equivalent level of care is available:: Yes  Covid Evaluation: Asymptomatic - no recent exposure (last 10 days) testing not required  Diagnosis: SBO (small bowel obstruction) [facility]) [301601]  Admitting Physician: Maryln Gottron [[id number]]  Attending Physician: Olexa.Dam, MIR Jaxson.Roy [[id number]]  Certification:: I certify this patient will need inpatient services for at least 2 midnights  Estimated Length of Stay: 3          Medical History Past Medical History:  Diagnosis Date   Alcohol use disorder in  remission 1999   Anxiety and depression    At high risk for falls    Former cigarette smoker 1999   Generalized anxiety disorder    Hard of hearing    Hyperlipidemia due to type 2 diabetes mellitus (HCC)    Hypertension    Morbid obesity (HCC)    Neuropathy    O2 dependent    Risk for falls    Sleep apnea    Type 2 diabetes mellitus (HCC)    Ventral hernia with bowel obstruction    Per Hospital Encounter on 05/21/22    Allergies Allergies  Allergen Reactions   Tape Rash    If left on for a long time    IV Location/Drains/Wounds Patient Lines/Drains/Airways Status     Active Line/Drains/Airways     Name Placement date Placement time Site Days   Peripheral IV 09/24/22 20 G Anterior;Left;Proximal Forearm 09/24/22  0157  Forearm  less than 1   NG/OG Vented/Dual Lumen 18 Fr. 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   BUN 28 (H) 8 - 23 mg/dL   Creatinine, Ser 4.09 (H) 0.61 - 1.24 mg/dL   Calcium 8.5 (L) 8.9 - 10.3 mg/dL   Total Protein 6.9 6.5 - 8.1 g/dL   Albumin 3.5 3.5 - 5.0 g/dL   AST 15 15 - 41 U/L   ALT 16 0 - 44 U/L   Alkaline Phosphatase 85 38 - 126 U/L   Total Bilirubin 0.6 0.3 - 1.2 mg/dL   GFR, Estimated 39 (L) >60 mL/min    Comment: (NOTE) Calculated using the CKD-EPI Creatinine Equation (2021)    Anion gap 9 5 - 15    Comment: Performed at [facility], [address] [place], [place] [address]  Lipase, blood     Status: Abnormal   Collection Time: 09/24/22  1:55 AM  Result Value Ref Range   Lipase 64 (H) 11 - 51 U/L    Comment: Performed at [facility], [address] [place], [place] [address]  CBC with Diff     Status: Abnormal   Collection Time: 09/24/22  1:55 AM  Result Value Ref Range   WBC 7.1 4.0 - 10.5 K/uL   RBC 4.10 (L) 4.22 - 5.81 MIL/uL   Hemoglobin 11.0 (L) 13.0 - 17.0 g/dL   HCT 56.2 (L) 13.0 - 86.5 %   MCV 90.0 80.0 - 100.0 fL   MCH 26.8 26.0 - 34.0 pg   MCHC 29.8 (L) 30.0 - 36.0 g/dL   RDW 78.4 (H) 69.6 - 29.5 %   Platelets 250 150 - 400 K/uL   nRBC 0.0 0.0 - 0.2 %   Neutrophils Relative % 73 %   Neutro Abs 5.2 1.7 - 7.7 K/uL   Lymphocytes Relative 11 %   Lymphs Abs 0.8 0.7 - 4.0 K/uL   Monocytes Relative 12 %   Monocytes Absolute 0.8 0.1 - 1.0 K/uL   Eosinophils Relative 3 %   Eosinophils Absolute 0.2 0.0 - 0.5 K/uL   Basophils Relative 1 %   Basophils Absolute 0.0 0.0 - 0.1 K/uL   Immature Granulocytes 0 %   Abs Immature Granulocytes 0.02 0.00 - 0.07 K/uL    Comment: Performed at [facility], [address], [place], [place] [address]  Urinalysis, Routine w reflex microscopic -Urine, Clean Catch     Status: Abnormal   Collection Time: 09/24/22  5:27 AM  Result Value Ref Range   Color, Urine STRAW (A) YELLOW   APPearance CLEAR CLEAR   Specific Gravity, Urine 1.030 1.005 - 1.030   pH 5.0 5.0 - 8.0   Glucose, UA >=500 (A) NEGATIVE mg/dL   Hgb urine dipstick NEGATIVE NEGATIVE   Bilirubin Urine NEGATIVE NEGATIVE   Ketones, ur NEGATIVE NEGATIVE mg/dL   Protein, ur NEGATIVE NEGATIVE mg/dL   Nitrite NEGATIVE NEGATIVE   Leukocytes,Ua NEGATIVE NEGATIVE   RBC / HPF 0-5 0 - 5 RBC/hpf   WBC, UA 0-5 0 - 5 WBC/hpf   Bacteria, UA RARE (A) NONE SEEN   Squamous  Epithelial / HPF 0-5 0 - 5 /HPF   Mucus PRESENT     Comment: Performed at [facility], [address],  [place], [place] [address]  Hemoglobin A1c     Status: Abnormal   Collection Time: 09/24/22  8:39 AM  Result Value Ref Range   Hgb A1c MFr Bld 10.1 (H) 4.8 - 5.6 %    Comment: (NOTE) Pre diabetes:          5.7%-6.4%  Diabetes:              >6.4%  Glycemic control for   <7.0% adults with diabetes    Mean Plasma Glucose 243.17 mg/dL    Comment: Performed at [facility], [address], [place], [place] [address]  CBG monitoring, ED     Status: Abnormal   Collection Time: 09/24/22  9:03 AM  Result Value Ref Range   Glucose-Capillary 313 (H) 70 - 99 mg/dL    Comment: Glucose reference range applies only to samples taken after fasting for at least 8 hours.   DG Abd Portable 1V-Small Bowel Protocol-Position Verification  Result Date: 09/24/2022 CLINICAL DATA:  [id number] Encounter for imaging study to confirm nasogastric (NG) tube placement [id number] EXAM: PORTABLE ABDOMEN - 1 VIEW COMPARISON:  09/24/2022 FINDINGS: Limited radiograph of the lower chest and upper abdomen was obtained for the purposes of enteric tube localization. Enteric tube is seen coursing below the diaphragm with distal tip and side port terminating within the expected location of the gastric body. Small left pleural effusion. IMPRESSION: Enteric tube terminates within the expected location of the gastric body. Electronically Signed   By: Duanne Guess D.O.   On: 09/24/2022 09:11   DG Abd Portable 1 View  Result Date: 09/24/2022 CLINICAL DATA:  71 year old male status post nasogastric tube placement. EXAM: PORTABLE ABDOMEN - 1 VIEW COMPARISON:  Abdominal radiograph 05/22/2022. FINDINGS: Nasogastric tube extends into the proximal stomach with side port just distal to the gastroesophageal junction. The lower abdomen is incompletely imaged. In the visualized upper abdomen, bowel-gas pattern is unremarkable. Cardiomegaly. Pacemaker leads noted. IMPRESSION: 1. Nasogastric tube is in the proximal stomach and could be advanced  approximately 10 cm for more optimal placement. Electronically Signed   By: Trudie Reed M.D.   On: 09/24/2022 05:32   CT ABDOMEN PELVIS W CONTRAST  Result Date: 09/24/2022 CLINICAL DATA:  Abdominal pain.  Bowel obstruction suspected. EXAM: CT ABDOMEN AND PELVIS WITH CONTRAST TECHNIQUE: Multidetector CT imaging of the abdomen and pelvis was performed using the standard protocol following bolus administration of intravenous contrast. RADIATION DOSE REDUCTION: This exam was performed according to the departmental dose-optimization program which includes automated exposure control, adjustment of the mA and/or kV according to patient size and/or use of iterative reconstruction technique. CONTRAST:  OMNIPAQUE IOHEXOL 300 MG/ML  SOLN COMPARISON:  05/21/2022. FINDINGS: Lower chest: A small pericardial effusion is noted. Pacemaker leads are seen in the heart. There is a small left pleural effusion with atelectasis or infiltrate. Mild atelectasis is noted at the right lung base. Hepatobiliary: The liver has a nodular contour, compatible with underlying cirrhosis. Subcentimeter hypodensity is present in the left lobe of the liver, suggesting cyst. No biliary ductal dilatation. The gallbladder is without stones. Pancreas: Unremarkable. No pancreatic ductal dilatation or surrounding inflammatory changes. Spleen: Normal in size without focal abnormality. Adrenals/Urinary Tract: The adrenal glands are within normal limits. Renal cortical thinning is noted bilaterally. There is a masslike region in the lower pole  of the left kidney measuring 4.3 cm, increased in size from the prior exam. No renal calculus or hydronephrosis. The bladder is unremarkable. Stomach/Bowel: There is a large complex ventral abdominal wall hernia in the midline containing the stomach, small bowel, and colon. Appendix is not seen. Mildly distended loops of small bowel are noted in the abdomen measuring up to 3.6 cm with air-fluid levels. There  is a suspected transition point in the ventral abdominal wall hernia, not well delineated due to bowel clumping and field-of-view, however both distended and a collapsed loops of small bowel in the hernia sac. There is a left lower quadrant broad-based spigelian hernia with dilatation of a loop small bowel proximal to the efferent loop measuring 5.3 cm with transition point along the medial edge of the hernia. The distal small bowel is completely compressed. No free air or pneumatosis. A right lower quadrant colostomy is noted. Vascular/Lymphatic: Aortic atherosclerosis. No enlarged abdominal or pelvic lymph nodes. Reproductive: Prostate is unremarkable. Other: Small amount of free fluid is noted in the hernia sac in the midline. Musculoskeletal: No acute osseous abnormality. IMPRESSION: 1. Multiple dilated loops of small bowel in the abdomen measuring up to 5.3 cm with suspected transition points in complex ventral and left spigelian large hernias, suspicious for bowel obstruction as detailed above. Surgical consultation is recommended. 2. Left renal mass measuring 4.3 cm, increased in size from the prior exam and concerning for malignancy. 3. Small left pleural effusion with atelectasis or infiltrate. 4. Morphologic changes of cirrhosis. 5. Aortic atherosclerosis. Critical Value/emergent results were called by telephone at the time of interpretation on 09/24/2022 at 3:41 am to provider Roxy Horseman , who verbally acknowledged these results. Electronically Signed   By: Thornell Sartorius M.D.   On: 09/24/2022 03:44    Pending Labs Unresulted Labs (From admission, onward)     Start     Ordered   09/25/22 0500  Basic metabolic panel  Tomorrow morning,   R        09/24/22 0755   09/25/22 0500  CBC  Tomorrow morning,   R        09/24/22 0755   09/24/22 1146  Protime-INR  Add-on,   AD        09/24/22 1145            Vitals/Pain Today's Vitals   09/24/22 0717 09/24/22 0935 09/24/22 1106 09/24/22 1107   BP:   (!) 169/86 (!) 169/86  Pulse:    71  Resp:   19 18  Temp:  98.6 F (37 C)    TempSrc:  Oral    SpO2:   96% 96%  PainSc: 10-Worst pain ever       Isolation Precautions No active isolations  Medications Medications  isosorbide mononitrate (IMDUR) 24 hr tablet 60 mg (has no administration in time range)  DULoxetine (CYMBALTA) DR capsule 60 mg (has no administration in time range)  insulin glargine-yfgn (SEMGLEE) injection 25 Units (has no administration in time range)  methocarbamol (ROBAXIN) tablet 500 mg (has no administration in time range)  rOPINIRole (REQUIP) tablet 2 mg (has no administration in time range)  tiZANidine (ZANAFLEX) tablet 2 mg (has no administration in time range)  topiramate (TOPAMAX) tablet 25 mg (has no administration in time range)  pantoprazole (PROTONIX) injection 40 mg (40 mg Intravenous Given 09/24/22 1100)  insulin aspart (novoLOG) injection 0-15 Units (11 Units Subcutaneous Given 09/24/22 0929)  insulin aspart (novoLOG) injection 0-5 Units (has no administration in time range)  acetaminophen (TYLENOL) tablet 650 mg (has no administration in time range)    Or  acetaminophen (TYLENOL) suppository 650 mg (has no administration in time range)  HYDROmorphone (DILAUDID) injection 0.5-1 mg (1 mg Intravenous Given 09/24/22 1121)  ondansetron (ZOFRAN) tablet 4 mg (has no administration in time range)    Or  ondansetron (ZOFRAN) injection 4 mg (has no administration in time range)  albuterol (PROVENTIL) (2.5 MG/3ML) 0.083% nebulizer solution 2.5 mg (has no administration in time range)  hydrALAZINE (APRESOLINE) injection 10 mg (has no administration in time range)  0.9 %  sodium chloride infusion ( Intravenous New Bag/Given 09/24/22 0933)  diatrizoate meglumine-sodium (GASTROGRAFIN) 66-10 % solution 90 mL (has no administration in time range)  HYDROmorphone (DILAUDID) injection 0.5 mg (0.5 mg Intravenous Given 09/24/22 0201)  ondansetron (ZOFRAN) injection 4 mg (4  mg Intravenous Given 09/24/22 0201)  iohexol (OMNIPAQUE) 300 MG/ML solution 100 mL (100 mLs Intravenous Contrast Given 09/24/22 0238)  HYDROmorphone (DILAUDID) injection 1 mg (1 mg Intravenous Given 09/24/22 0440)    Mobility non-ambulatory

## 2022-09-24 NOTE — Plan of Care (Signed)
  Problem: Education: Goal: Knowledge of General Education information will improve Description: Including pain rating scale, medication(s)/side effects and non-pharmacologic comfort measures Outcome: Progressing   Problem: Health Behavior/Discharge Planning: Goal: Ability to manage health-related needs will improve Outcome: Progressing   Problem: Clinical Measurements: Goal: Ability to maintain clinical measurements within normal limits will improve Outcome: Progressing Goal: Will remain free from infection Outcome: Progressing   Problem: Pain Managment: Goal: General experience of comfort will improve Outcome: Progressing   Problem: Safety: Goal: Ability to remain free from injury will improve Outcome: Progressing   Problem: Elimination: Goal: Will not experience complications related to bowel motility Outcome: Not Progressing Note: Pt here with SBO. He has been able to pass gas but has not had a BM yet.

## 2022-09-24 NOTE — H&P (Addendum)
History and Physical  Jesse [name] ZOX:[id number] DOB: 1951/01/26 DOA: 09/24/2022  PCP: Caesar Bookman, NP   Chief Complaint: Abdominal pain  HPI: Jesse [name] is a 72 y.o. male with medical history significant for obesity, chronic diastolic congestive heart failure, insulin-dependent type 2 diabetes, CKD stage IV, hypertension, multiple abdominal surgeries and complicated abdominal history being admitted to the hospital with recurrent small bowel obstruction.  Patient was admitted for the same 05/21/22 to 05/23/22.  He was in his usual state of health until last evening around 11 PM, when he had sudden onset of left upper quadrant abdominal pain.  This is typical pain for when he has had smal obstructions in the pastl.  Denies any nausea or vomiting, no fevers, no chills, no chest pain, no diarrhea last bowel movement was yesterday and has not passed any flatus since being admitted to the ER last night.  He has had NG tube placed in the ER, which has slightly reduced his abdominal pain.  General surgery was consulted and will see him this morning, hospitalist was contacted for admission.  Review of Systems: Please see HPI for pertinent positives and negatives. A complete 10 system review of systems are otherwise negative.  Past Medical History:  Diagnosis Date   Alcohol use disorder in remission 1999   Anxiety and depression    At high risk for falls    Former cigarette smoker 1999   Generalized anxiety disorder    Hard of hearing    Hyperlipidemia due to type 2 diabetes mellitus (HCC)    Hypertension    Morbid obesity (HCC)    Neuropathy    O2 dependent    Risk for falls    Sleep apnea    Type 2 diabetes mellitus (HCC)    Ventral hernia with bowel obstruction    Per Hospital Encounter on 05/21/22   Past Surgical History:  Procedure Laterality Date   APPENDECTOMY  1980   IR THORACENTESIS ASP PLEURAL SPACE W/IMG GUIDE  05/18/2021   IR THORACENTESIS ASP PLEURAL SPACE W/IMG GUIDE   10/06/2021   KNEE SURGERY Right    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   Procedure: PACEMAKER IMPLANT;  Surgeon: Duke Salvia, MD;  Location: [facility] INVASIVE CV LAB;  Service: Cardiovascular;  Laterality: N/A;    Social History:  reports that he quit smoking about 25 years ago. His smoking use included cigarettes. He has never used smokeless tobacco. He reports that he does not currently use alcohol. He reports that he does not use drugs.   Allergies  Allergen Reactions   Tape Rash    If left on for a long time    Family History  Problem Relation Age of Onset   Diabetes Brother    Bipolar disorder Daughter    Anxiety disorder Son      Prior to Admission medications   Medication Sig Start Date End Date Taking? Authorizing Provider  Accu-Chek Softclix Lancets lancets USE TO TEST BLOOD GLUCOSE ONCE DAILY AS DIRECTED 05/04/22   Ngetich, [name] C, NP  acetaminophen (TYLENOL) 325 MG tablet Take 2 tablets (650 mg total) by mouth every 6 (six) hours as needed for mild pain or moderate pain. 02/13/22   Leroy Sea, MD  alfuzosin (UROXATRAL) 10 MG 24 hr tablet TAKE 1 TABLET BY MOUTH EVERY DAY 08/07/22   Ngetich, [name] C, NP  ALPRAZolam (XANAX) 0.5 MG tablet TAKE 1 TABLET(0.5 MG) BY MOUTH TWICE DAILY AS NEEDED FOR ANXIETY OR SLEEP 06/11/22  Ngetich, [name] C, NP  apixaban (ELIQUIS) 5 MG TABS tablet Take 1 tablet (5 mg total) by mouth 2 (two) times daily. 07/11/22   Eustace Pen, PA-C  calcium carbonate (TUMS - DOSED IN MG ELEMENTAL CALCIUM) 500 MG chewable tablet Chew 1 tablet by mouth 2 (two) times daily.    [provider]  Cholecalciferol (VITAMIN D3) 25 MCG (1000 UT) capsule Take 1 capsule (1,000 Units total) by mouth daily. Patient not taking: Reported on 08/13/2022 04/05/22   Ngetich, [name] C, NP  cyanocobalamin 1000 MCG tablet Take 1,000 mcg by mouth daily.    [provider]  diclofenac Sodium (VOLTAREN) 1 % GEL Apply 2 g topically 4 (four) times daily. Apply to your  right neck and shoulder area 3-4 times a day as needed 09/05/22   Ngetich, [name] C, NP  DULoxetine (CYMBALTA) 60 MG capsule TAKE 1 CAPSULE(60 MG) BY MOUTH DAILY Patient taking differently: Take 60 mg by mouth daily. 02/26/22   Ngetich, [name] C, NP  furosemide (LASIX) 40 MG tablet Take 80 mg by mouth 2 (two) times daily.    [provider]  glucose blood (ACCU-CHEK GUIDE) test strip USE TO TEST BLOOD SUGAR ONCE DAILY 04/06/22   Ngetich, [name] C, NP  Insulin Pen Needle 32G X 4 MM MISC Use to inject insulin twice daily. Dx: E11.22 09/11/21   Ngetich, [name], NP  isosorbide mononitrate (IMDUR) 60 MG 24 hr tablet Take 1 tablet (60 mg total) by mouth daily. 01/25/22   Ngetich, [name] C, NP  JARDIANCE 25 MG TABS tablet TAKE 1 TABLET BY MOUTH EVERY DAY 05/24/22   Ngetich, [name] C, NP  ketoconazole (NIZORAL) 2 % cream Apply 1 application topically as needed for irritation. 03/16/21   Ngetich, [name], NP  Lancet Device MISC 1 Device by Does not apply route daily. 06/16/21   Ngetich, [name] C, NP  LANTUS SOLOSTAR 100 UNIT/ML Solostar Pen ADMINISTER 50 UNITS UNDER THE SKIN AT BEDTIME 08/24/22   Ngetich, [name] C, NP  methocarbamol (ROBAXIN) 500 MG tablet Take 1 tablet (500 mg total) by mouth every 8 (eight) hours as needed for muscle spasms. 07/26/22   Ngetich, [name] C, NP  ondansetron (ZOFRAN) 4 MG tablet Take 1 tablet (4 mg total) by mouth every 8 (eight) hours as needed for nausea or vomiting. 05/28/22   Ngetich, [name], NP  OVER THE COUNTER MEDICATION Apply 1 Application topically every other day. Head and shoulder medicated shampoo alternate with selsun blue medicated    [provider]  OXYGEN Inhale 2 L into the lungs continuous.    [provider]  pantoprazole (PROTONIX) 40 MG tablet TAKE 1 TABLET(40 MG) BY MOUTH TWICE DAILY BEFORE A MEAL 08/07/22   Ngetich, [name] C, NP  potassium chloride SA (KLOR-CON M) 20 MEQ tablet Take 1 tablet (20 mEq total) by mouth 2 (two) times daily. 02/15/22    Leroy Sea, MD  Red Yeast Rice 600 MG CAPS Take 1,200 mg by mouth daily.    [provider]  rOPINIRole (REQUIP) 2 MG tablet TAKE 1 TABLET(2 MG) BY MOUTH AT BEDTIME Patient taking differently: Take 2 mg by mouth at bedtime. 02/20/22   Ngetich, [name] C, NP  rosuvastatin (CRESTOR) 10 MG tablet TAKE 1 TABLET BY MOUTH EVERY DAY 02/26/22   Ngetich, [name] C, NP  simethicone (MYLICON) 80 MG chewable tablet Chew 80 mg by mouth every 6 (six) hours as needed for flatulence.    [provider]  tiZANidine (ZANAFLEX) 2 MG tablet TAKE 1 TABLET BY MOUTH TWICE DAILY Patient taking differently: Take 2 mg by mouth as needed. 08/07/22   Ngetich, [name] C, NP  topiramate (TOPAMAX) 25 MG tablet TAKE 1 TABLET BY MOUTH EVERY DAY Patient taking differently: Take 25 mg by mouth daily. 04/23/22   Ngetich, [name] C, NP  traMADol (ULTRAM) 50 MG tablet TAKE 1 TABLET(50 MG) BY MOUTH TWICE DAILY AS NEEDED 08/24/22   Ngetich, [name] C, NP  traZODone (DESYREL) 50 MG tablet TAKE 2 TABLETS(100 MG) BY MOUTH AT BEDTIME 08/27/22   Ngetich, [name] C, NP  triamcinolone cream (KENALOG) 0.1 % Apply 1 application. topically 2 (two) times daily. Apply to affected areas on shin area 06/30/21   Ngetich, [name] C, NP  VICTOZA 18 MG/3ML SOPN ADMINISTER 1.8 MG UNDER THE SKIN EVERY DAY 07/23/22   Ngetich, [name] C, NP  Vitamin D, Ergocalciferol, (DRISDOL) 1.25 MG (50000 UNIT) CAPS capsule TAKE 1 CAPSULE BY MOUTH EVERY 7 DAYS 09/10/22   Ngetich, [name], NP    Physical Exam: BP (!) 182/97   Pulse 70   Temp 97.8 F (36.6 C) (Oral)   Resp 18   SpO2 96%   General: Obese gentleman appearing his stated age, resting comfortably in the ER, he has NG tube in place.  Pleasant and cooperative, good historian. Eyes: EOMI, clear conjuctivae, white sclerea Neck: supple, no masses, trachea mildline  Cardiovascular: RRR, no murmurs or rubs, no peripheral edema  Respiratory: clear to auscultation bilaterally on anterior exam, no wheezes, no  crackles  Abdomen: Abdomen is soft, nontender except to deep palpation in the left upper quadrant.  He has multiple reducible hernias, as well as a right lower quadrant ostomy. Skin: dry, no rashes  Musculoskeletal: no joint effusions, normal range of motion  Psychiatric: appropriate affect, normal speech  Neurologic: extraocular muscles intact, clear speech, moving all extremities with intact sensorium          Labs on Admission:  Basic Metabolic Panel: Recent Labs  Lab 09/24/22 0155  NA 132*  K 4.3  CL 102  CO2 21*  GLUCOSE 391*  BUN 28*  CREATININE 1.83*  CALCIUM 8.5*   Liver Function Tests: Recent Labs  Lab 09/24/22 0155  AST 15  ALT 16  ALKPHOS 85  BILITOT 0.6  PROT 6.9  ALBUMIN 3.5   Recent Labs  Lab 09/24/22 0155  LIPASE 64*   No results for input(s): "AMMONIA" in the last 168 hours. CBC: Recent Labs  Lab 09/24/22 0155  WBC 7.1  NEUTROABS 5.2  HGB 11.0*  HCT 36.9*  MCV 90.0  PLT 250   Cardiac Enzymes: No results for input(s): "CKTOTAL", "CKMB", "CKMBINDEX", "TROPONINI" in the last 168 hours.  BNP (last 3 results) Recent Labs    02/11/22 0209 02/12/22 0212 02/13/22 0302  BNP 55.5 51.2 94.6    ProBNP (last 3 results) No results for input(s): "PROBNP" in the last 8760 hours.  CBG: No results for input(s): "GLUCAP" in the last 168 hours.  Radiological Exams on Admission: DG Abd Portable 1 View  Result Date: 09/24/2022 CLINICAL DATA:  72 year old male status post nasogastric tube placement. EXAM: PORTABLE ABDOMEN - 1 VIEW COMPARISON:  Abdominal radiograph 05/22/2022. FINDINGS: Nasogastric tube extends into the proximal stomach with side port just distal to the gastroesophageal junction. The lower abdomen is incompletely imaged. In the visualized upper abdomen, bowel-gas pattern is unremarkable. Cardiomegaly. Pacemaker leads noted. IMPRESSION: 1. Nasogastric tube is in the proximal stomach and could be  advanced approximately 10 cm for more  optimal placement. Electronically Signed   By: Trudie Reed M.D.   On: 09/24/2022 05:32   CT ABDOMEN PELVIS W CONTRAST  Result Date: 09/24/2022 CLINICAL DATA:  Abdominal pain.  Bowel obstruction suspected. EXAM: CT ABDOMEN AND PELVIS WITH CONTRAST TECHNIQUE: Multidetector CT imaging of the abdomen and pelvis was performed using the standard protocol following bolus administration of intravenous contrast. RADIATION DOSE REDUCTION: This exam was performed according to the departmental dose-optimization program which includes automated exposure control, adjustment of the mA and/or kV according to patient size and/or use of iterative reconstruction technique. CONTRAST:  OMNIPAQUE IOHEXOL 300 MG/ML  SOLN COMPARISON:  05/21/2022. FINDINGS: Lower chest: A small pericardial effusion is noted. Pacemaker leads are seen in the heart. There is a small left pleural effusion with atelectasis or infiltrate. Mild atelectasis is noted at the right lung base. Hepatobiliary: The liver has a nodular contour, compatible with underlying cirrhosis. Subcentimeter hypodensity is present in the left lobe of the liver, suggesting cyst. No biliary ductal dilatation. The gallbladder is without stones. Pancreas: Unremarkable. No pancreatic ductal dilatation or surrounding inflammatory changes. Spleen: Normal in size without focal abnormality. Adrenals/Urinary Tract: The adrenal glands are within normal limits. Renal cortical thinning is noted bilaterally. There is a masslike region in the lower pole of the left kidney measuring 4.3 cm, increased in size from the prior exam. No renal calculus or hydronephrosis. The bladder is unremarkable. Stomach/Bowel: There is a large complex ventral abdominal wall hernia in the midline containing the stomach, small bowel, and colon. Appendix is not seen. Mildly distended loops of small bowel are noted in the abdomen measuring up to 3.6 cm with air-fluid levels. There is a suspected transition  point in the ventral abdominal wall hernia, not well delineated due to bowel clumping and field-of-view, however both distended and a collapsed loops of small bowel in the hernia sac. There is a left lower quadrant broad-based spigelian hernia with dilatation of a loop small bowel proximal to the efferent loop measuring 5.3 cm with transition point along the medial edge of the hernia. The distal small bowel is completely compressed. No free air or pneumatosis. A right lower quadrant colostomy is noted. Vascular/Lymphatic: Aortic atherosclerosis. No enlarged abdominal or pelvic lymph nodes. Reproductive: Prostate is unremarkable. Other: Small amount of free fluid is noted in the hernia sac in the midline. Musculoskeletal: No acute osseous abnormality. IMPRESSION: 1. Multiple dilated loops of small bowel in the abdomen measuring up to 5.3 cm with suspected transition points in complex ventral and left spigelian large hernias, suspicious for bowel obstruction as detailed above. Surgical consultation is recommended. 2. Left renal mass measuring 4.3 cm, increased in size from the prior exam and concerning for malignancy. 3. Small left pleural effusion with atelectasis or infiltrate. 4. Morphologic changes of cirrhosis. 5. Aortic atherosclerosis. Critical Value/emergent results were called by telephone at the time of interpretation on 09/24/2022 at 3:41 am to provider Roxy Horseman , who verbally acknowledged these results. Electronically Signed   By: Thornell Sartorius M.D.   On: 09/24/2022 03:44    Assessment/Plan Jesse [name] is a 72 y.o. male with medical history significant for obesity, chronic diastolic congestive heart failure, insulin-dependent type 2 diabetes, CKD stage IV, hypertension, atrial fibrillation on Eliquis, multiple abdominal surgeries and complicated abdominal history being admitted to the hospital with recurrent small bowel obstruction.   Small bowel obstruction-with sudden onset of left upper  quadrant abdominal pain, CT scan confirms  multiple dilated loops of small bowel. -Inpatient admission -N.p.o. except for ice chips and meds -Copious IV fluids -Pain and nausea medications as needed, patient has not been nauseous -Continue NG tube to LIS -Appreciate general surgery involvement -Will hold Eliquis and maintain on heparin drip as mentioned below  Insulin-dependent type 2 diabetes -Anticipate diabetic diet when advanced -Update hemoglobin A1c -Basal insulin nightly at half dose, as patient is n.p.o.  History of persistent atrial fibrillation-with elevated CHA2DS2-VASc Score = 7 probably better not to discontinue anticoagulation.  Discussed with pharmacy staff, will start IV heparin drip.  Diastolic congestive heart failure-with no current evidence of acute exacerbation -Continue Imdur, hold Lasix as he requires hydration for his bowel obstruction -Will need to monitor closely for signs or symptoms of developing heart failure  GERD-IV PPI while has NG tube  CKD stage IV-appears to be at baseline, will monitor with daily labs  Left renal mass-first identified in Dec 2023, and enlarging.  Concerning for malignancy.  Discussed with patient, who will follow-up closely with his urologist. Also discussed over the phone with his daughter Larkin Ina and asked her to call [facility], with whom he is established, to arrange outpatient follow up.  DVT prophylaxis: Heparin drip    Code Status: Full Code  Consults called: General surgery Dr. Luisa Hart  Admission status: The appropriate patient status for this patient is INPATIENT. Inpatient status is judged to be reasonable and necessary in order to provide the required intensity of service to ensure the patient's safety. The patient's presenting symptoms, physical exam findings, and initial radiographic and laboratory data in the context of their chronic comorbidities is felt to place them at high risk for further clinical deterioration.  Furthermore, it is not anticipated that the patient will be medically stable for discharge from the hospital within 2 midnights of admission.    I certify that at the point of admission it is my clinical judgment that the patient will require inpatient hospital care spanning beyond 2 midnights from the point of admission due to high intensity of service, high risk for further deterioration and high frequency of surveillance required  Time spent: 53 minutes   Sharlette Dense MD [facility] Pager [phone]  If 7PM-7AM, please contact night-coverage [email or web address] Password [facility]  09/24/2022, 7:55 AM

## 2022-09-24 NOTE — Progress Notes (Addendum)
ANTICOAGULATION CONSULT NOTE - Initial Consult  Pharmacy Consult for Heparin Indication: atrial fibrillation  Allergies  Allergen Reactions   Tape Rash    If left on for a long time    Patient Measurements: Height: 6' (182.9 cm) Weight: (!) 164.7 kg (363 lb) IBW/kg (Calculated) : 77.6 Heparin Dosing Weight: 117.3 kg  Vital Signs: Temp: 98.6 F (37 C) (08/05 0935) Temp Source: Oral (08/05 0935) BP: 169/86 (08/05 1107) Pulse Rate: 71 (08/05 1107)  Labs: Recent Labs    09/24/22 0155  HGB 11.0*  HCT 36.9*  PLT 250  CREATININE 1.83*    Estimated Creatinine Clearance: 58 mL/min (A) (by C-G formula based on SCr of 1.83 mg/dL (H)).   Medical History: Past Medical History:  Diagnosis Date   Alcohol use disorder in remission 1999   Anxiety and depression    At high risk for falls    Former cigarette smoker 1999   Generalized anxiety disorder    Hard of hearing    Hyperlipidemia due to type 2 diabetes mellitus (HCC)    Hypertension    Morbid obesity (HCC)    Neuropathy    O2 dependent    Risk for falls    Sleep apnea    Type 2 diabetes mellitus (HCC)    Ventral hernia with bowel obstruction    Per Hospital Encounter on 05/21/22   Medications:  Eliquis 5mg  bid PTA, last dose: 8/4 morning   Assessment: Jesse [name] is a 72 y.o. male with medical history significant for obesity, chronic diastolic congestive heart failure, insulin-dependent type 2 diabetes, CKD stage IV, hypertension, atrial fibrillation on Eliquis, multiple abdominal surgeries and complicated abdominal history being admitted to the hospital with recurrent small bowel obstruction. Presented to ED with abdominal pain.  CT notable for SBO.  General surgery following.  Pharmacy has been consulted to dose IV Heparin in case patient needs surgery during admission.   Use of Eliquis falsely elevates heparin levels so will monitor therapy with aPTT until effects worn off and aPTT and heparin levels  correlate.  Today, 09/24/22  - Hgb = 11 - Plt = 250  Goal of Therapy:  - Heparin level 0.3-0.7 units/ml - aPTT 66-102 seconds - Monitor platelets by anticoagulation protocol: Yes    Plan:  - Omit bolus - Start heparin IV at 1700 units/hr - Baseline aPTT, heparin level, PT/INR, and CBC - With recent Eliquis use, will need to utilize aPTT for initial dosing. - Check aPTT level in 8 hours. - Daily heparin level until correlates with aPTT and daily CBC - Continue to monitor H&H and platelets - Follow plans for surgery    Tylene Fantasia 09/24/2022,12:13 PM

## 2022-09-24 NOTE — ED Triage Notes (Signed)
Patient BIB EMS from home c/o abdominal pain. Patient worsening abdominal pain this morning. Patient denies N/V. Patient a/ox4.

## 2022-09-25 ENCOUNTER — Inpatient Hospital Stay (HOSPITAL_COMMUNITY): Payer: Medicare PPO

## 2022-09-25 ENCOUNTER — Encounter (HOSPITAL_COMMUNITY): Payer: Self-pay | Admitting: Internal Medicine

## 2022-09-25 DIAGNOSIS — K56609 Unspecified intestinal obstruction, unspecified as to partial versus complete obstruction: Secondary | ICD-10-CM | POA: Diagnosis not present

## 2022-09-25 LAB — GLUCOSE, CAPILLARY
Glucose-Capillary: 252 mg/dL — ABNORMAL HIGH (ref 70–99)
Glucose-Capillary: 202 mg/dL — ABNORMAL HIGH (ref 70–99)
Glucose-Capillary: 249 mg/dL — ABNORMAL HIGH (ref 70–99)
Glucose-Capillary: 223 mg/dL — ABNORMAL HIGH (ref 70–99)
Glucose-Capillary: 181 mg/dL — ABNORMAL HIGH (ref 70–99)

## 2022-09-25 LAB — APTT
aPTT: 49 seconds — ABNORMAL HIGH (ref 24–36)
aPTT: 68 seconds — ABNORMAL HIGH (ref 24–36)

## 2022-09-25 LAB — MRSA NEXT GEN BY PCR, NASAL: MRSA by PCR Next Gen: NOT DETECTED

## 2022-09-25 MED ORDER — ALPRAZOLAM 0.5 MG PO TABS
0.5000 mg | ORAL_TABLET | Freq: Two times a day (BID) | ORAL | Status: DC | PRN
  Administered 2022-09-25: 0.5 mg via ORAL
  Filled 2022-09-25: qty 1

## 2022-09-25 MED ORDER — HYDRALAZINE HCL 20 MG/ML IJ SOLN
10.0000 mg | INTRAMUSCULAR | Status: DC | PRN
  Filled 2022-09-25: qty 1

## 2022-09-25 MED ORDER — HEPARIN (PORCINE) 25000 UT/250ML-% IV SOLN
2350.0000 [IU]/h | INTRAVENOUS | Status: DC
  Administered 2022-09-25 – 2022-09-27 (×4): 2350 [IU]/h via INTRAVENOUS
  Filled 2022-09-25 (×4): qty 250

## 2022-09-25 MED ORDER — ROPINIROLE HCL 1 MG PO TABS
2.0000 mg | ORAL_TABLET | Freq: Once | ORAL | Status: AC
  Administered 2022-09-25: 2 mg via ORAL
  Filled 2022-09-25: qty 8

## 2022-09-25 MED ORDER — HEPARIN BOLUS VIA INFUSION
4000.0000 [IU] | Freq: Once | INTRAVENOUS | Status: AC
  Administered 2022-09-25: 4000 [IU] via INTRAVENOUS
  Filled 2022-09-25: qty 4000

## 2022-09-25 NOTE — Progress Notes (Signed)
ANTICOAGULATION CONSULT NOTE - follow up  Pharmacy Consult for Heparin Indication: atrial fibrillation  Allergies  Allergen Reactions   Tape Rash    If left on for a long time    Patient Measurements: Height: 6' (182.9 cm) Weight: (!) 169.8 kg (374 lb 5.5 oz) IBW/kg (Calculated) : 77.6 Heparin Dosing Weight: 117.3 kg  Vital Signs: Temp: 98.7 F (37.1 C) (08/06 0513) BP: 161/67 (08/06 0513) Pulse Rate: 87 (08/06 0513)  Labs: Recent Labs    09/24/22 0155 09/24/22 0527 09/24/22 1245 09/25/22 0045 09/25/22 1131  HGB 11.0*  --   --  12.6*  --   HCT 36.9*  --   --  43.5  --   PLT 250  --   --  237  --   APTT  --  31  --  35 49*  LABPROT  --  14.4  --   --   --   INR  --  1.1  --   --   --   HEPARINUNFRC  --   --  1.03* 0.69  --   CREATININE 1.83*  --   --  1.44*  --     Estimated Creatinine Clearance: 75.1 mL/min (A) (by C-G formula based on SCr of 1.44 mg/dL (H)).  Medications:  Eliquis 5mg  bid PTA, last dose: 8/4 morning   Assessment: Jesse [name] is a 72 y.o. male with medical history significant for obesity, chronic diastolic congestive heart failure, insulin-dependent type 2 diabetes, CKD stage IV, hypertension, atrial fibrillation on Eliquis, multiple abdominal surgeries and complicated abdominal history being admitted to the hospital with recurrent small bowel obstruction. Presented to ED with abdominal pain.  CT notable for SBO.  General surgery following.  Pharmacy has been consulted to dose IV Heparin in case patient needs surgery during admission.   Use of Eliquis falsely elevates heparin levels so will monitor therapy with aPTT until effects worn off and aPTT and heparin levels correlate.  Today, 09/25/22  aPTT 49 still subtherapeutic after heparin drip rate increased to 2050 units/hr - per RN heparin is infusing with no issues Hgb 12.6, plts 237 No bleeding noted per RN  Goal of Therapy:  - Heparin level 0.3-0.7 units/ml - aPTT 66-102 seconds -  Monitor platelets by anticoagulation protocol: Yes    Plan:  - heparin bolus 4000 units IV x 1 and increase heparin drip to 2350 units/hr - Check aPTT level in 8 hours. - Daily heparin level until correlates with aPTT and daily CBC - Continue to monitor H&H and platelets - Follow plans for SBO  Herby Abraham, Pharm.D Use secure chat for questions 09/25/2022 12:27 PM

## 2022-09-25 NOTE — TOC CM/SW Note (Signed)
Transition of Care [facility]) - Inpatient Brief Assessment   Patient Details  Name: Jesse [name] MRN: [id number] Date of Birth: 08/14/1950  Transition of [facility]) CM/SW Contact:    Otelia Santee, LCSW Phone Number: 09/25/2022, 12:48 PM   Clinical Narrative: Pt residing at [facility] ILF and utilizes Pass[employer] transport manager for mobility. Pt may require ambulance transport at discharge.    Transition of Care Asessment: Insurance and Status: Insurance coverage has been reviewed Patient has primary care physician: Yes Home environment has been reviewed: [facility] living Prior level of function:: Cabin crew Home Services: No current home services Social Determinants of Health Reivew: SDOH reviewed no interventions necessary Readmission risk has been reviewed: Yes Transition of care needs: no transition of care needs at this time

## 2022-09-25 NOTE — Progress Notes (Signed)
ANTICOAGULATION CONSULT NOTE - follow up  Pharmacy Consult for Heparin Indication: atrial fibrillation  Allergies  Allergen Reactions   Tape Rash    If left on for a long time    Patient Measurements: Height: 6' (182.9 cm) Weight: (!) 169.8 kg (374 lb 5.5 oz) IBW/kg (Calculated) : 77.6 Heparin Dosing Weight: 117.3 kg  Vital Signs: Temp: 98.6 F (37 C) (08/06 0104) BP: 146/65 (08/06 0224) Pulse Rate: 88 (08/06 0224)  Labs: Recent Labs    09/24/22 0155 09/24/22 0527 09/24/22 1245 09/25/22 0045  HGB 11.0*  --   --  12.6*  HCT 36.9*  --   --  43.5  PLT 250  --   --  237  APTT  --  31  --  35  LABPROT  --  14.4  --   --   INR  --  1.1  --   --   HEPARINUNFRC  --   --  1.03*  --   CREATININE 1.83*  --   --  1.44*    Estimated Creatinine Clearance: 75.1 mL/min (A) (by C-G formula based on SCr of 1.44 mg/dL (H)).   Medical History: Past Medical History:  Diagnosis Date   Alcohol use disorder in remission 1999   Anxiety and depression    At high risk for falls    Former cigarette smoker 1999   Generalized anxiety disorder    Hard of hearing    Hyperlipidemia due to type 2 diabetes mellitus (HCC)    Hypertension    Morbid obesity (HCC)    Neuropathy    O2 dependent    Risk for falls    Sleep apnea    Type 2 diabetes mellitus (HCC)    Ventral hernia with bowel obstruction    Per Hospital Encounter on 05/21/22   Medications:  Eliquis 5mg  bid PTA, last dose: 8/4 morning   Assessment: Jesse [name] is a 72 y.o. male with medical history significant for obesity, chronic diastolic congestive heart failure, insulin-dependent type 2 diabetes, CKD stage IV, hypertension, atrial fibrillation on Eliquis, multiple abdominal surgeries and complicated abdominal history being admitted to the hospital with recurrent small bowel obstruction. Presented to ED with abdominal pain.  CT notable for SBO.  General surgery following.  Pharmacy has been consulted to dose IV Heparin  in case patient needs surgery during admission.   Use of Eliquis falsely elevates heparin levels so will monitor therapy with aPTT until effects worn off and aPTT and heparin levels correlate.  Today, 09/25/22  aPTT 35 subtherapeutic on 1700 units/hr HL 0.69 Hgb 12.6, plts 237 No bleeding noted per RN  Goal of Therapy:  - Heparin level 0.3-0.7 units/ml - aPTT 66-102 seconds - Monitor platelets by anticoagulation protocol: Yes    Plan:  - increase heparin drip to 2050 units/hr - Check aPTT level in 8 hours. - Daily heparin level until correlates with aPTT and daily CBC - Continue to monitor H&H and platelets - Follow plans for surgery   Arley Phenix RPh 09/25/2022, 2:53 AM

## 2022-09-25 NOTE — Plan of Care (Signed)

## 2022-09-25 NOTE — Progress Notes (Signed)
[facility] Progress Note     Subjective: CC: had some nausea, NG occluded at some point yesterday, flushed and then filled a cannister. Reports some flatus via ostomy but no stool.   Objective: Vital signs in last 24 hours: Temp:  [98.1 F (36.7 C)-98.8 F (37.1 C)] 98.7 F (37.1 C) (08/06 0513) Pulse Rate:  [70-88] 87 (08/06 0513) Resp:  [17-19] 18 (08/06 0513) BP: (146-194)/(65-86) 161/67 (08/06 0513) SpO2:  [96 %-99 %] 96 % (08/06 0513) Weight:  [164.7 kg-169.8 kg] 169.8 kg (08/05 1304) Last BM Date : 09/25/22  Intake/Output from previous day: 08/05 0701 - 08/06 0700 In: 1632.2 [I.V.:1632.2] Out: 4950 [Urine:2050; Emesis/NG output:2900] Intake/Output this shift: Total I/O In: -  Out: 470 [Emesis/NG output:470]  PE: Gen:  Alert, NAD, pleasant Card:  Regular rate and rhythm, pedal pulses 2+ BL Pulm:  Normal effort, clear to auscultation bilaterally Abd: Soft, obese, nontender, chronic hernias with lose of domain. Ostomy with small amt stool/gas. Skin: warm and dry, no rashes  Psych: A&Ox3   Lab Results:  Recent Labs    09/24/22 0155 09/25/22 0045  WBC 7.1 10.6*  HGB 11.0* 12.6*  HCT 36.9* 43.5  PLT 250 237   BMET Recent Labs    09/24/22 0155 09/25/22 0045  NA 132* 140  K 4.3 4.4  CL 102 107  CO2 21* 20*  GLUCOSE 391* 237*  BUN 28* 22  CREATININE 1.83* 1.44*  CALCIUM 8.5* 8.7*   PT/INR Recent Labs    09/24/22 0527  LABPROT 14.4  INR 1.1   CMP     Component Value Date/Time   NA 140 09/25/2022 0045   NA 137 06/06/2022 0000   K 4.4 09/25/2022 0045   CL 107 09/25/2022 0045   CO2 20 (L) 09/25/2022 0045   GLUCOSE 237 (H) 09/25/2022 0045   BUN 22 09/25/2022 0045   BUN 35 (A) 06/06/2022 0000   CREATININE 1.44 (H) 09/25/2022 0045   CREATININE 2.21 (H) 02/20/2022 1443   CALCIUM 8.7 (L) 09/25/2022 0045   PROT 6.9 09/24/2022 0155   ALBUMIN 3.5 09/24/2022 0155   AST 15 09/24/2022 0155   ALT 16 09/24/2022 0155   ALKPHOS 85  09/24/2022 0155   BILITOT 0.6 09/24/2022 0155   GFRNONAA 52 (L) 09/25/2022 0045   GFRAA (L) 04/08/2008 1240    54        The eGFR has been calculated using the MDRD equation. This calculation has not been validated in all clinical situations. eGFR's persistently <60 mL/min signify possible Chronic Kidney Disease.   Lipase     Component Value Date/Time   LIPASE 64 (H) 09/24/2022 0155       Studies/Results: DG Abd Portable 1V-Small Bowel Obstruction Protocol-initial, 8 hr delay  Result Date: 09/25/2022 CLINICAL DATA:  SBO 8 hour delay EXAM: PORTABLE ABDOMEN - 1 VIEW COMPARISON:  September 24, 2022 (8:48 a.m.) FINDINGS: There is stable enteric tube positioning. The stomach remains distended with a mild amount of radiopaque contrast seen within the region of the gastric fundus. Distended loops of air-filled bowel are again seen within the visualized portion of the mid abdomen. IMPRESSION: Persistent gastric distention with a mild amount of radiopaque contrast seen within the region of the gastric fundus. Electronically Signed   By: Aram Candela M.D.   On: 09/25/2022 00:38   DG Abd Portable 1V-Small Bowel Protocol-Position Verification  Result Date: 09/24/2022 CLINICAL DATA:  [id number] Encounter for imaging study to confirm nasogastric (NG) tube placement [phone]  EXAM: PORTABLE ABDOMEN - 1 VIEW COMPARISON:  09/24/2022 FINDINGS: Limited radiograph of the lower chest and upper abdomen was obtained for the purposes of enteric tube localization. Enteric tube is seen coursing below the diaphragm with distal tip and side port terminating within the expected location of the gastric body. Small left pleural effusion. IMPRESSION: Enteric tube terminates within the expected location of the gastric body. Electronically Signed   By: Duanne Guess D.O.   On: 09/24/2022 09:11   DG Abd Portable 1 View  Result Date: 09/24/2022 CLINICAL DATA:  72 year old male status post nasogastric tube placement. EXAM:  PORTABLE ABDOMEN - 1 VIEW COMPARISON:  Abdominal radiograph 05/22/2022. FINDINGS: Nasogastric tube extends into the proximal stomach with side port just distal to the gastroesophageal junction. The lower abdomen is incompletely imaged. In the visualized upper abdomen, bowel-gas pattern is unremarkable. Cardiomegaly. Pacemaker leads noted. IMPRESSION: 1. Nasogastric tube is in the proximal stomach and could be advanced approximately 10 cm for more optimal placement. Electronically Signed   By: Trudie Reed M.D.   On: 09/24/2022 05:32   CT ABDOMEN PELVIS W CONTRAST  Result Date: 09/24/2022 CLINICAL DATA:  Abdominal pain.  Bowel obstruction suspected. EXAM: CT ABDOMEN AND PELVIS WITH CONTRAST TECHNIQUE: Multidetector CT imaging of the abdomen and pelvis was performed using the standard protocol following bolus administration of intravenous contrast. RADIATION DOSE REDUCTION: This exam was performed according to the departmental dose-optimization program which includes automated exposure control, adjustment of the mA and/or kV according to patient size and/or use of iterative reconstruction technique. CONTRAST:  OMNIPAQUE IOHEXOL 300 MG/ML  SOLN COMPARISON:  05/21/2022. FINDINGS: Lower chest: A small pericardial effusion is noted. Pacemaker leads are seen in the heart. There is a small left pleural effusion with atelectasis or infiltrate. Mild atelectasis is noted at the right lung base. Hepatobiliary: The liver has a nodular contour, compatible with underlying cirrhosis. Subcentimeter hypodensity is present in the left lobe of the liver, suggesting cyst. No biliary ductal dilatation. The gallbladder is without stones. Pancreas: Unremarkable. No pancreatic ductal dilatation or surrounding inflammatory changes. Spleen: Normal in size without focal abnormality. Adrenals/Urinary Tract: The adrenal glands are within normal limits. Renal cortical thinning is noted bilaterally. There is a masslike region in the  lower pole of the left kidney measuring 4.3 cm, increased in size from the prior exam. No renal calculus or hydronephrosis. The bladder is unremarkable. Stomach/Bowel: There is a large complex ventral abdominal wall hernia in the midline containing the stomach, small bowel, and colon. Appendix is not seen. Mildly distended loops of small bowel are noted in the abdomen measuring up to 3.6 cm with air-fluid levels. There is a suspected transition point in the ventral abdominal wall hernia, not well delineated due to bowel clumping and field-of-view, however both distended and a collapsed loops of small bowel in the hernia sac. There is a left lower quadrant broad-based spigelian hernia with dilatation of a loop small bowel proximal to the efferent loop measuring 5.3 cm with transition point along the medial edge of the hernia. The distal small bowel is completely compressed. No free air or pneumatosis. A right lower quadrant colostomy is noted. Vascular/Lymphatic: Aortic atherosclerosis. No enlarged abdominal or pelvic lymph nodes. Reproductive: Prostate is unremarkable. Other: Small amount of free fluid is noted in the hernia sac in the midline. Musculoskeletal: No acute osseous abnormality. IMPRESSION: 1. Multiple dilated loops of small bowel in the abdomen measuring up to 5.3 cm with suspected transition points in complex ventral  and left spigelian large hernias, suspicious for bowel obstruction as detailed above. Surgical consultation is recommended. 2. Left renal mass measuring 4.3 cm, increased in size from the prior exam and concerning for malignancy. 3. Small left pleural effusion with atelectasis or infiltrate. 4. Morphologic changes of cirrhosis. 5. Aortic atherosclerosis. Critical Value/emergent results were called by telephone at the time of interpretation on 09/24/2022 at 3:41 am to provider Roxy Horseman , who verbally acknowledged these results. Electronically Signed   By: Thornell Sartorius M.D.   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 Harlan Stains in [facility], he was left open and went back for washout and closure the following day - HDS without fever, tachycardia or hypotension. WBC wnl. No peritonitis on exam. No indication for emergent surgical intervention.  - SBO protocol yesterday >> contrast in stomach, consistent with ongoing obstruction. Clinically he is partially obstruction - having some gas but no stool.  - Cont NPO/NGT to LIWS - Hopefully patient will improve with non-operative management. If patient fails to improve with conservative management, they may require exploratory surgery during admission. Patient would be high risk for surgery given MMP.    FEN - IVF, NPO/NGT to LIWS. Keep K > 4 and Mg > 2 for bowel function ID - none VTE - SCDs, okay for heparin gtt or therapeutic lovenox. Hold Eliquis Foley - none   Recent TIA vs CVA 10/2021 DM HLD OSA Chronic respiratory failure on home O2 Morbid obesity BMI 48.82 SSS s/p PPM CHF CAD with h/o cardiac stents HTN CKD-IV chronic anticoagulation use on Eliquis (last dose 8/4am)     LOS: 1 day   I reviewed hospitalist notes, last 24 h vitals and pain scores, last 48 h intake and output, last 24 h labs and trends, and last 24 h imaging results.  This care required moderate level of medical decision making.   Hosie Spangle, PA-C [facility] Please see Amion for pager number during day hours 7:00am-4:30pm

## 2022-09-25 NOTE — Progress Notes (Signed)
Progress Note    Jesse [name]   ASN:[id number]  DOB: 16-Jul-1950  DOA: 09/24/2022     1 PCP: Caesar Bookman, NP  Initial CC: abdominal pain  Hospital Course: Jesse [name] is a 72 yo male with PMH large complex ventral abdominal wall hernia, history of SBO, hx diverticulitis s/p Hartmann's with reversal (1989), hx transverse ischemic colon s/p resection 02/2015. Also PMH PAF, TIA, DMII, chronic hypoxia on 2 L home oxygen, SSS s/p PPM, dCHF, CAD, CKD4.  He presented with onset of abdominal pain notably in the left upper quadrant consistent with prior locations of SBO's.  Denied any nausea/vomiting.  He presented for further workup and evaluation. CT abdomen/pelvis showed multiple dilated loops of small bowel in the abdomen with suspected transition point.  Also left renal mass noted which had increased from prior size. He underwent NG tube placement and general surgery was consulted.  Interval History:  States he feels somewhat improved this morning in context of improved left-sided abdominal pain.  States that his ostomy bag has started putting out some as well and denies any nausea. Understands the plan is for continued watchful waiting.  Assessment and Plan:  Small bowel obstruction - hx similar; has large left sided chronic ventral hernia - hx ostomy placement after ischemic transverse colon resection and ascending ostomy placed 2017 -Abdominal pain from admission has improved some; still high output from NG tube -Some liquid noted in ostomy bag - General Surgery following   Insulin-dependent type 2 diabetes -A1c 10.1% on admission - Continue insulin regimen  PAF - on eliquis at home -Continue holding Eliquis and continue heparin drip in case of need for intervention  Chronic dCHF - no s/s exacerbation  - continue current regimen  - caution with over hydration   GERD-IV PPI while has NG tube   CKD stage IV - patient has history of CKD4. Baseline creat ~ 1.92, eGFR~  upper 20s - appears better than baseline currently   Left renal mass -first identified in Dec 2023, and enlarging.   - Concerning for malignancy.  Discussion held with patient on admission for following up with urology outpatient and also discussed with his daughter  Chronic debility - electric WC for mobility at baseline - resides at [facility]    Old records reviewed in assessment of this patient  Antimicrobials:   DVT prophylaxis:  SCDs Start: 09/24/22 0754   Code Status:   Code Status: Full Code  Mobility Assessment (Last 72 Hours)     Mobility Assessment     Row Name 09/25/22 0745 09/24/22 1943 09/24/22 1500       Does patient have an order for bedrest or is patient medically unstable No - Continue assessment No - Continue assessment No - Continue assessment     What is the highest level of mobility based on the progressive mobility assessment? Level 2 (Chairfast) - Balance while sitting on edge of bed and cannot stand Level 2 (Chairfast) - Balance while sitting on edge of bed and cannot stand Level 2 (Chairfast) - Balance while sitting on edge of bed and cannot stand     Is the above level different from baseline mobility prior to current illness? No - Consider discontinuing PT/OT -- No - Consider discontinuing PT/OT              Barriers to discharge: none Disposition Plan:  Home Status is: Inpt  Objective: Blood pressure (!) 161/67, pulse 87, temperature 98.7 F (37.1  C), resp. rate 18, height 6' (1.829 m), weight (!) 169.8 kg, SpO2 96%.  Examination:  Physical Exam Constitutional:      Appearance: He is well-developed. He is obese.  HENT:     Head: Normocephalic and atraumatic.     Mouth/Throat:     Mouth: Mucous membranes are moist.  Eyes:     Extraocular Movements: Extraocular movements intact.  Cardiovascular:     Rate and Rhythm: Normal rate and regular rhythm.  Pulmonary:     Effort: Pulmonary effort is normal. No  respiratory distress.     Breath sounds: Normal breath sounds. No wheezing.  Abdominal:     Palpations: Abdomen is soft.     Comments: Tinkling BS noted in RUQ and decreased BS in other regions. Very large left sided protruding hernias x 2 in left quadrants but are soft and NT. Ostomy bag in place with liquid stool noted inside, no gas.   Musculoskeletal:        General: Normal range of motion.     Cervical back: Normal range of motion and neck supple.  Skin:    General: Skin is warm and dry.  Neurological:     General: No focal deficit present.     Mental Status: He is alert.  Psychiatric:        Mood and Affect: Mood normal.      Consultants:  General surgery  Procedures:    Data Reviewed: Results for orders placed or performed during the hospital encounter of 09/24/22 (from the past 24 hour(s))  Heparin level (unfractionated)     Status: Abnormal   Collection Time: 09/24/22 12:45 PM  Result Value Ref Range   Heparin Unfractionated 1.03 (H) 0.30 - 0.70 IU/mL  Glucose, capillary     Status: Abnormal   Collection Time: 09/24/22  3:40 PM  Result Value Ref Range   Glucose-Capillary 208 (H) 70 - 99 mg/dL  Glucose, capillary     Status: Abnormal   Collection Time: 09/24/22  5:05 PM  Result Value Ref Range   Glucose-Capillary 194 (H) 70 - 99 mg/dL  Glucose, capillary     Status: Abnormal   Collection Time: 09/24/22  9:47 PM  Result Value Ref Range   Glucose-Capillary 175 (H) 70 - 99 mg/dL   Comment 1 Notify RN    Comment 2 Document in Chart   APTT     Status: None   Collection Time: 09/25/22 12:45 AM  Result Value Ref Range   aPTT 35 24 - 36 seconds  Basic metabolic panel     Status: Abnormal   Collection Time: 09/25/22 12:45 AM  Result Value Ref Range   Sodium 140 135 - 145 mmol/L   Potassium 4.4 3.5 - 5.1 mmol/L   Chloride 107 98 - 111 mmol/L   CO2 20 (L) 22 - 32 mmol/L   Glucose, Bld 237 (H) 70 - 99 mg/dL   BUN 22 8 - 23 mg/dL   Creatinine, Ser 2.13 (H) 0.61 -  1.24 mg/dL   Calcium 8.7 (L) 8.9 - 10.3 mg/dL   GFR, Estimated 52 (L) >60 mL/min   Anion gap 13 5 - 15  Heparin level (unfractionated)     Status: None   Collection Time: 09/25/22 12:45 AM  Result Value Ref Range   Heparin Unfractionated 0.69 0.30 - 0.70 IU/mL  CBC     Status: Abnormal   Collection Time: 09/25/22 12:45 AM  Result Value Ref Range   WBC 10.6 (H) 4.0 -  10.5 K/uL   RBC 4.74 4.22 - 5.81 MIL/uL   Hemoglobin 12.6 (L) 13.0 - 17.0 g/dL   HCT 52.8 41.3 - 24.4 %   MCV 91.8 80.0 - 100.0 fL   MCH 26.6 26.0 - 34.0 pg   MCHC 29.0 (L) 30.0 - 36.0 g/dL   RDW 01.0 (H) 27.2 - 53.6 %   Platelets 237 150 - 400 K/uL   nRBC 0.0 0.0 - 0.2 %  Glucose, capillary     Status: Abnormal   Collection Time: 09/25/22  2:59 AM  Result Value Ref Range   Glucose-Capillary 252 (H) 70 - 99 mg/dL   Comment 1 Notify RN    Comment 2 Document in Chart   Glucose, capillary     Status: Abnormal   Collection Time: 09/25/22  7:41 AM  Result Value Ref Range   Glucose-Capillary 202 (H) 70 - 99 mg/dL    I have reviewed pertinent nursing notes, vitals, labs, and images as necessary. I have ordered labwork to follow up on as indicated.  I have reviewed the last notes from staff over past 24 hours. I have discussed patient's care plan and test results with nursing staff, CM/SW, and other staff as appropriate.  Time spent: Greater than 50% of the 55 minute visit was spent in counseling/coordination of care for the patient as laid out in the A&P.   LOS: 1 day   Lewie Chamber, MD [facility] 09/25/2022, 11:38 AM

## 2022-09-25 NOTE — Progress Notes (Signed)
ANTICOAGULATION CONSULT NOTE - follow up  Pharmacy Consult for Heparin Indication: atrial fibrillation  Allergies  Allergen Reactions   Tape Rash    If left on for a long time    Patient Measurements: Height: 6' (182.9 cm) Weight: (!) 169.8 kg (374 lb 5.5 oz) IBW/kg (Calculated) : 77.6 Heparin Dosing Weight: 117.3 kg  Vital Signs: Temp: 97.4 F (36.3 C) (08/06 1959) Temp Source: Oral (08/06 1959) BP: 115/75 (08/06 1959) Pulse Rate: 97 (08/06 1959)  Labs: Recent Labs    09/24/22 0155 09/24/22 0527 09/24/22 0527 09/24/22 1245 09/25/22 0045 09/25/22 1131 09/25/22 2035  HGB 11.0*  --   --   --  12.6*  --   --   HCT 36.9*  --   --   --  43.5  --   --   PLT 250  --   --   --  237  --   --   APTT  --  31   < >  --  35 49* 68*  LABPROT  --  14.4  --   --   --   --   --   INR  --  1.1  --   --   --   --   --   HEPARINUNFRC  --   --   --  1.03* 0.69  --   --   CREATININE 1.83*  --   --   --  1.44*  --   --    < > = values in this interval not displayed.    Estimated Creatinine Clearance: 75.1 mL/min (A) (by C-G formula based on SCr of 1.44 mg/dL (H)).  Medications:  Eliquis 5mg  bid PTA, last dose: 8/4 morning   Assessment: Jesse [name] is a 72 y.o. male with medical history significant for obesity, chronic diastolic congestive heart failure, insulin-dependent type 2 diabetes, CKD stage IV, hypertension, atrial fibrillation on Eliquis, multiple abdominal surgeries and complicated abdominal history being admitted to the hospital with recurrent small bowel obstruction. Presented to ED with abdominal pain.  CT notable for SBO.  General surgery following.  Pharmacy has been consulted to dose IV Heparin in case patient needs surgery during admission.   Use of Eliquis falsely elevates heparin levels so will monitor therapy with aPTT until effects worn off and aPTT and heparin levels correlate.  Today, 09/25/22  aPTT 49 still subtherapeutic after heparin drip rate  increased to 2050 units/hr - per RN heparin is infusing with no issues Hgb 12.6, plts 237 No bleeding noted per RN  2035 aptt 68, therapeutic on 2350 units/hr No bleeding noted per RN  Goal of Therapy:  - Heparin level 0.3-0.7 units/ml - aPTT 66-102 seconds - Monitor platelets by anticoagulation protocol: Yes    Plan:  - continue heparin drip at 2350 units/hr - Check aPTT level in 8 hours. - Daily heparin level until correlates with aPTT and daily CBC - Continue to monitor H&H and platelets - Follow plans for SBO  Arley Phenix RPh 09/25/2022, 10:04 PM

## 2022-09-25 NOTE — Hospital Course (Addendum)
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 Also PMH PAF, TIA, DMII, chronic hypoxia on 2 L home oxygen, SSS s/p PPM, dCHF, CAD, CKD4.  He presented with onset of abdominal pain notably in the left upper quadrant consistent with prior locations of SBO's.  Denied any nausea/vomiting.  He presented for further workup and evaluation. CT abdomen/pelvis showed multiple dilated loops of small bowel in the abdomen with suspected transition point.  Also left renal mass noted which had increased from prior size. He underwent NG tube placement and general surgery was consulted.  **Interim History Patient abdominal pain is improving and he started having a lot of ostomy output and took a lot of ice and water yesterday.  Surgery is following and patient subsequently removed his NG tube and states that he is feeling better.  Diet is being advanced slowly and he is now on a full liquid diet and tolerated this well and was then advance to soft which she then also tolerated.  PT OT recommending home health.  He is deemed medically stable now after able to tolerate diet and general surgery signed off the case.  Assessment and Plan:  Small Bowel Obstruction -Hx similar; has large left sided chronic ventral hernia -Hx ostomy placement after ischemic transverse colon resection and ascending ostomy placed 2017 -Abdominal pain from admission has improved some; still high output from NG tube however his NG tube was removed and now general surgery slowly advancing his diet and going to a full liquid diet given his improvement; Patient tolerated this well and diet was advanced and general surgery felt that if he is able to tolerate solids he could be discharged.  Patient was able to tolerate soft so he was discharged only to follow-up with PCP and neurosurgery  outpatient setting -Some liquid noted in ostomy bag -General Surgery following -Continue with mobilization and ensure electrolytes are optimized   Insulin-Dependent Type 2 Diabetes Mellitus  -A1c 10.1% on admission -Glucose and CBG Trend: Recent Labs  Lab 09/24/22 0155 09/25/22 0045 09/26/22 0452 09/27/22 0606  GLUCOSE 391* 237* 216* 185*   Recent Labs  Lab 09/26/22 0302 09/26/22 0734 09/26/22 1136 09/26/22 1648 09/26/22 2127 09/27/22 0544 09/27/22 0737  GLUCAP 202* 200* 214* 212* 145* 167* 190*  -Continue insulin regimen with Semglee but increase from 25 units nightly to 35 units nightly and continue with moderate NovoLog sliding scale insulin before meals and at bedtime and Adjust Regimen as necessary -Follow-up in outpatient setting   PAF -On Anticoagulation with Eliquis at home -Continue holding Eliquis and continue heparin drip in case of need for intervention -Pharmacy Managing Heparin gtt for now and resumed heparin prior to discharge   Chronic dCHF -No S/Sx of exacerbation  -Continue current regimen -Strict I's and O's and Daily Weights; No intake or output data in the 24 hours ending 09/29/22 1941 -Judicious and cautious use of hydration  Hypokalemia -Patient's K+ Level Trend: Recent Labs  Lab 09/24/22 0155 09/25/22 0045 09/26/22 0452 09/27/22 0606  K 4.3 4.4 3.6 3.2*  -Repleted prior to discharge with K-Phos neutral and p.o. KCl -Continue to Monitor and Replete as Necessary -Repeat CMP within 1 week  Hypophosphatemia -Patient's Phos Level Trend: Recent Labs  Lab 09/27/22 0606  PHOS 1.8*  -Replete with IV K Phos 30 mmol however did not finish so was given p.o. K-Phos Neutral -  Continue to Monitor and Replete as Necessary -Repeat Phos Level within 1 week  GERD/GI Prophylaxis -C/w PPI with Pantoprazole 40 mg po Daily    CKD stage IV -Patient has history of CKD4. 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   -Concerning for malignancy.  Discussion held with patient on admission for following up with urology outpatient and also discussed with his daughter   Chronic Debility -Uses electric WC for mobility at baseline -Resides at [facility] independent living  -Obtain PT/OT to evaluate and treat to work on strengthening and transfers and they are recommending home health  Normocytic Anemia -Hgb/Hct Trend: Recent Labs  Lab 09/24/22 0155 09/25/22 0045 09/26/22 0452 09/27/22 0606  HGB 11.0* 12.6* 12.7* 11.6*  HCT 36.9* 43.5 43.4 39.4  MCV 90.0 91.8 91.9 90.0  -Check Anemia Panel and showed an iron level of 44, UIBC of 292, TIBC of 336, saturation ratios of 13%, ferritin of 19, folate level 1.3, vitamin B12 level of 712 -Continue to Monitor for S/Sx of Bleeding; No overt bleeding noted -Repeat CBC within 1 week and defer to PCP to start completion  Hypoalbuminemia -Patient's Albumin Trend: Recent Labs  Lab 09/24/22 0155 09/27/22 0606  ALBUMIN 3.5 3.3*  -Continue to Monitor and Trend and repeat CMP in the AM  Super Morbid Obesity -Complicates overall prognosis and care -Estimated body mass index is 50.77 kg/m as calculated from the following:   Height as of this encounter: 6' (1.829 m).   Weight as of this encounter: 169.8 kg.  -Weight Loss and Dietary Counseling given

## 2022-09-26 DIAGNOSIS — N184 Chronic kidney disease, stage 4 (severe): Secondary | ICD-10-CM | POA: Diagnosis not present

## 2022-09-26 DIAGNOSIS — E1122 Type 2 diabetes mellitus with diabetic chronic kidney disease: Secondary | ICD-10-CM

## 2022-09-26 DIAGNOSIS — G4733 Obstructive sleep apnea (adult) (pediatric): Secondary | ICD-10-CM

## 2022-09-26 DIAGNOSIS — N183 Chronic kidney disease, stage 3 unspecified: Secondary | ICD-10-CM

## 2022-09-26 DIAGNOSIS — Z993 Dependence on wheelchair: Secondary | ICD-10-CM

## 2022-09-26 DIAGNOSIS — J9611 Chronic respiratory failure with hypoxia: Secondary | ICD-10-CM

## 2022-09-26 DIAGNOSIS — K56609 Unspecified intestinal obstruction, unspecified as to partial versus complete obstruction: Secondary | ICD-10-CM | POA: Diagnosis not present

## 2022-09-26 DIAGNOSIS — I480 Paroxysmal atrial fibrillation: Secondary | ICD-10-CM

## 2022-09-26 DIAGNOSIS — K436 Other and unspecified ventral hernia with obstruction, without gangrene: Secondary | ICD-10-CM

## 2022-09-26 DIAGNOSIS — I5022 Chronic systolic (congestive) heart failure: Secondary | ICD-10-CM

## 2022-09-26 DIAGNOSIS — I10 Essential (primary) hypertension: Secondary | ICD-10-CM

## 2022-09-26 LAB — GLUCOSE, CAPILLARY
Glucose-Capillary: 202 mg/dL — ABNORMAL HIGH (ref 70–99)
Glucose-Capillary: 200 mg/dL — ABNORMAL HIGH (ref 70–99)
Glucose-Capillary: 214 mg/dL — ABNORMAL HIGH (ref 70–99)
Glucose-Capillary: 212 mg/dL — ABNORMAL HIGH (ref 70–99)
Glucose-Capillary: 145 mg/dL — ABNORMAL HIGH (ref 70–99)

## 2022-09-26 MED ORDER — TRAZODONE HCL 50 MG PO TABS
50.0000 mg | ORAL_TABLET | Freq: Every evening | ORAL | Status: DC | PRN
  Administered 2022-09-26: 50 mg via ORAL
  Filled 2022-09-26: qty 1

## 2022-09-26 MED ORDER — INSULIN GLARGINE-YFGN 100 UNIT/ML ~~LOC~~ SOLN
35.0000 [IU] | Freq: Every day | SUBCUTANEOUS | Status: DC
  Administered 2022-09-26: 35 [IU] via SUBCUTANEOUS
  Filled 2022-09-26 (×2): qty 0.35

## 2022-09-26 NOTE — Plan of Care (Signed)

## 2022-09-26 NOTE — Evaluation (Signed)
Occupational Therapy Evaluation Patient Details Name: Jesse [name] MRN: [id number] DOB: 08-20-1950 Today's Date: 09/26/2022   History of Present Illness Patient is a 72 year old male who presented on 8/5 with abdominal pain. Patient was admitted with SBO with large ventral hernia   PMH: 2L at home, SSS s/p PPM, dCHF, CAD, CKD VI, diverticulitis s/p hartmann's with reversal, large complex ventral abdominal wall hernia.   Clinical Impression  Patient evaluated by Occupational Therapy with no further acute OT needs identified. All education has been completed and the patient has no further questions. Patient is at his baseline and not skilled OT interventions needed at this time.  See below for any follow-up Occupational Therapy or equipment needs. OT is signing off. Thank you for this referral.        Functional Status Assessment  Patient has not had a recent decline in their functional status  Equipment Recommendations  None recommended by OT       Precautions / Restrictions Precautions Precautions: Fall Restrictions Weight Bearing Restrictions: No         Balance Overall balance assessment: No apparent balance deficits (not formally assessed), History of Falls             ADL either performed or assessed with clinical judgement   ADL Overall ADL's : At baseline         General ADL Comments: patient is at his baseline for ADLs at this time. patient was able to compelete transfer to recliner in room with RW with no LOB or safety risks. patient endorsed being at his baseline. patient reported daughter is an OT and he has had OTs assess his ILF to make sure it is optimally set up for him.     Vision Baseline Vision/History: 1 Wears glasses              Pertinent Vitals/Pain Pain Assessment Pain Assessment: No/denies pain     Extremity/Trunk Assessment Upper Extremity Assessment Upper Extremity Assessment: Defer to OT evaluation   Lower Extremity  Assessment Lower Extremity Assessment: Overall WFL for tasks assessed (AROM WFL, LLE slightly limited in seated hip flexion due to hernia girth, chronic peripheral neuropathy)   Cervical / Trunk Assessment Cervical / Trunk Assessment: Normal   Communication Communication Communication: No apparent difficulties   Cognition Arousal: Alert Behavior During Therapy: WFL for tasks assessed/performed                                                    Home Living Family/patient expects to be discharged to:: Private residence Living Arrangements: Alone Available Help at Discharge: Family Type of Home: Independent living facility Home Access: Level entry     Home Layout: One level     Bathroom Shower/Tub: Producer, television/film/video: Handicapped height Bathroom Accessibility: Yes   Home Equipment: Grab bars - tub/shower;Grab bars - toilet;Hand held Stage manager (4 wheels);Wheelchair - power;Shower seat   Additional Comments: daughter is an OT      Prior Functioning/Environment Prior Level of Function : Independent/Modified Independent             Mobility Comments: pt reports using electric w/c, takes step pivot transfer to electric w/c, had HHPT ~45 days ago with good success ADLs Comments: pt reports ind with ostomy, ind with self care and household chores, has  cleaning lady 1x week who mops floor and cleans restroom        OT Problem List: Decreased activity tolerance;Impaired balance (sitting and/or standing);Decreased safety awareness      OT Treatment/Interventions:      OT Goals(Current goals can be found in the care plan section) Acute Rehab OT Goals Patient Stated Goal: to get back home tomorrow OT Goal Formulation: All assessment and education complete, DC therapy  OT Frequency:         AM-PAC OT "6 Clicks" Daily Activity     Outcome Measure Help from another person eating meals?: None Help from another person taking  care of personal grooming?: None Help from another person toileting, which includes using toliet, bedpan, or urinal?: None Help from another person bathing (including washing, rinsing, drying)?: None Help from another person to put on and taking off regular upper body clothing?: None Help from another person to put on and taking off regular lower body clothing?: None 6 Click Score: 24   End of Session Equipment Utilized During Treatment: Gait belt;Rolling walker (2 wheels) Nurse Communication: Mobility status  Activity Tolerance: Patient tolerated treatment well Patient left: in chair;with call bell/phone within reach  OT Visit Diagnosis: Unsteadiness on feet (R26.81);Other abnormalities of gait and mobility (R26.89)                Time: 8469-6295 OT Time Calculation (min): 39 min Charges:  OT General Charges $OT Visit: 1 Visit OT Evaluation $OT Eval Low Complexity: 1 Low OT Treatments $Self Care/Home Management : 23-37 mins   OTR/L, MS Acute Rehabilitation Department Office# [phone]   Selinda Flavin 09/26/2022, 12:16 PM

## 2022-09-26 NOTE — Evaluation (Addendum)
Physical Therapy Evaluation Only Patient Details Name: Jesse [name] MRN: [id number] DOB: 09/09/1950 Today's Date: 09/26/2022  History of Present Illness  Patient is a 72 year old male who presented on 8/5 with abdominal pain. Patient was admitted with SBO with large ventral hernia   PMH: 2L at home, SSS s/p PPM, dCHF, CAD, CKD VI, diverticulitis s/p hartmann's with reversal, large complex ventral abdominal wall hernia.  Clinical Impression  Pt from ILF, reports he has a cleaning lady 1x/week to provide mopping and bathroom cleaning, otherwise he is ind with all self care, household chores and uses electric w/c for mobility. Pt reports he transfers to electric w/c with a few steps, denies any falls this calendar year but does report falls in past. Pt reports previous success with HHPT and wants to do it again. Pt reports sleeping in lift chair at home; pt reports got OOB without assistance this morning. Pt is modified ind with transfers, able to stand statically with UE support for 90 sec which pt reports is slightly below baseline. No acute PT needs identified, will sign off at this time. Pt requesting HHPT for endurance training.      If plan is discharge home, recommend the following:     Can travel by private vehicle        Equipment Recommendations None recommended by PT  Recommendations for Other Services       Functional Status Assessment Patient has not had a recent decline in their functional status     Precautions / Restrictions Precautions Precautions: Fall Restrictions Weight Bearing Restrictions: No      Mobility  Bed Mobility                    Transfers Overall transfer level: Modified independent Equipment used: Rolling walker (2 wheels)               General transfer comment: STS transfer from recliner with hands assisting, tolerates static support standing for 90 seconds before requesting seated rest    Ambulation/Gait                General Gait Details: pt reports using electric w/c at baseline  Stairs            Wheelchair Mobility     Tilt Bed    Modified Rankin (Stroke Patients Only)       Balance Overall balance assessment: No apparent balance deficits (not formally assessed), History of Falls                                           Pertinent Vitals/Pain Pain Assessment Pain Assessment: No/denies pain    Home Living Family/patient expects to be discharged to:: Private residence Living Arrangements: Alone Available Help at Discharge: Family Type of Home: Independent living facility Home Access: Level entry       Home Layout: One level Home Equipment: Grab bars - tub/shower;Grab bars - toilet;Hand held shower head;Rollator (4 wheels);Wheelchair - power;Shower seat Additional Comments: daughter is an OT    Prior Function Prior Level of Function : Independent/Modified Independent             Mobility Comments: pt reports using electric w/c, takes step pivot transfer to electric w/c, had HHPT ~45 days ago with good success ADLs Comments: pt reports ind with ostomy, ind with self care and household chores, has  cleaning lady 1x week who mops floor and cleans restroom     Extremity/Trunk Assessment   Upper Extremity Assessment Upper Extremity Assessment: Defer to OT evaluation    Lower Extremity Assessment Lower Extremity Assessment: Overall WFL for tasks assessed (AROM WFL, LLE slightly limited in seated hip flexion due to hernia girth, chronic peripheral neuropathy)    Cervical / Trunk Assessment Cervical / Trunk Assessment: Normal  Communication   Communication Communication: No apparent difficulties  Cognition Arousal: Alert Behavior During Therapy: WFL for tasks assessed/performed Overall Cognitive Status: Within Functional Limits for tasks assessed                                          General Comments      Exercises      Assessment/Plan    PT Assessment All further PT needs can be met in the next venue of care  PT Problem List Decreased activity tolerance       PT Treatment Interventions      PT Goals (Current goals can be found in the Care Plan section)  Acute Rehab PT Goals Patient Stated Goal: "I want HHPT again with Rob or Beth from [place]" PT Goal Formulation: All assessment and education complete, DC therapy    Frequency       Co-evaluation               AM-PAC PT "6 Clicks" Mobility  Outcome Measure Help needed turning from your back to your side while in a flat bed without using bedrails?: None Help needed moving from lying on your back to sitting on the side of a flat bed without using bedrails?: None Help needed moving to and from a bed to a chair (including a wheelchair)?: None Help needed standing up from a chair using your arms (e.g., wheelchair or bedside chair)?: None Help needed to walk in hospital room?: Total (electric w/c baseline) Help needed climbing 3-5 steps with a railing? : Total (electric w/c baseline) 6 Click Score: 18    End of Session Equipment Utilized During Treatment: Oxygen Activity Tolerance: Patient tolerated treatment well Patient left: in chair;with call bell/phone within reach Nurse Communication: Mobility status PT Visit Diagnosis: Other abnormalities of gait and mobility (R26.89)    Time: 9811-9147 PT Time Calculation (min) (ACUTE ONLY): 22 min   Charges:   PT Evaluation $PT Eval Low Complexity: 1 Low   PT General Charges $$ ACUTE PT VISIT: 1 Visit         Tori  PT, DPT 09/26/22, 12:18 PM

## 2022-09-26 NOTE — Progress Notes (Signed)
PROGRESS NOTE    TIAGO ROTHBERG  URK:[id number] DOB: 1950/10/31 DOA: 09/24/2022 PCP: Caesar Bookman, NP   Brief Narrative:  Mr. 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 Also PMH PAF, TIA, DMII, chronic hypoxia on 2 L home oxygen, SSS s/p PPM, dCHF, CAD, CKD4.  He presented with onset of abdominal pain notably in the left upper quadrant consistent with prior locations of SBO's.  Denied any nausea/vomiting.  He presented for further workup and evaluation. CT abdomen/pelvis showed multiple dilated loops of small bowel in the abdomen with suspected transition point.  Also left renal mass noted which had increased from prior size. He underwent NG tube placement and general surgery was consulted.  **Interim History Patient abdominal pain is improving and he started having a lot of ostomy output and took a lot of ice and water yesterday.  Surgery is following and patient subsequently removed his NG tube and states that he is feeling better.  Diet is being advanced slowly and he is now on a full liquid diet.  PT OT recommending home health.  Assessment and Plan:  Small Bowel Obstruction -Hx similar; has large left sided chronic ventral hernia -Hx ostomy placement after ischemic transverse colon resection and ascending ostomy placed 2017 -Abdominal pain from admission has improved some; still high output from NG tube however his NG tube was removed and now general surgery slowly advancing her diet and going to a full liquid diet -Some liquid noted in ostomy bag -General Surgery following -Continue with mobilization and ensure electrolytes are optimized   Insulin-Dependent Type 2 Diabetes Mellitus  -A1c 10.1% on admission -Glucose and CBG Trend: Recent Labs  Lab 09/24/22 0155 09/25/22 0045 09/26/22 0452  GLUCOSE 391* 237* 216*    Recent Labs  Lab 09/25/22 0259 09/25/22 0741 09/25/22 1145 09/25/22 1658 09/25/22 2109 09/26/22 0302 09/26/22 0734  GLUCAP 252* 202* 249* 223* 181* 202* 200*  -Continue insulin regimen with Semglee but increase from 25 units nightly to 35 units nightly and continue with moderate NovoLog sliding scale insulin before meals and at bedtime and Adjust Regimen as necessary   PAF -On Anticoagulation with Eliquis at home -Continue holding Eliquis and continue heparin drip in case of need for intervention -Pharmacy Managing Heparin gtt for now   Chronic dCHF -No S/Sx of exacerbation  -Continue current regimen -Strict I's and O's and Daily Weights;  Intake/Output Summary (Last 24 hours) at 09/26/2022 0759 Last data filed at 09/26/2022 0414 Gross per 24 hour  Intake 0 ml  Output 5030 ml  Net -5030 ml  -Judicious and cautious use of hydration  GERD/GI Prophylaxis -Use PPI with Pantoprazole 40 mg while has NG tube   CKD stage IV -Patient has history of CKD4. Baseline creat ~ 1.92, eGFR~ upper 20s -Appears better than baseline currently -BUN/Cr Trend: Recent Labs  Lab 09/24/22 0155 09/25/22 0045 09/26/22 0452  BUN 28* 22 33*  CREATININE 1.83* 1.44* 1.81*  -Avoid Nephrotoxic Medications, Contrast Dyes, Hypotension and Dehydration to Ensure Adequate Renal Perfusion and will need to Renally Adjust Meds -Continue to Monitor and Trend Renal Function carefully and repeat CMP in the AM   Left Renal Mass -First identified in Dec 2023, and enlarging.   -Concerning for malignancy.  Discussion held with patient on admission for following up with urology outpatient and also discussed with his daughter  Chronic Debility -Uses electric WC for mobility at baseline -Resides at [facility] independent living  -Obtain PT/OT to evaluate and treat to work on strengthening and transfers  Normocytic Anemia -Hgb/Hct Trend: Recent Labs  Lab 09/24/22 0155 09/25/22 0045 09/26/22 0452  HGB  11.0* 12.6* 12.7*  HCT 36.9* 43.5 43.4  MCV 90.0 91.8 91.9  -Check Anemia Panel in the AM -Continue to Monitor for S/Sx of Bleeding; No overt bleeding noted -Repeat CBC in the AM  Super Morbid Obesity -Complicates overall prognosis and care -Estimated body mass index is 50.77 kg/m as calculated from the following:   Height as of this encounter: 6' (1.829 m).   Weight as of this encounter: 169.8 kg.  -Weight Loss and Dietary Counseling given   DVT prophylaxis: SCDs Start: 09/24/22 0754; Anticoagulated with a Heparin gtt    Code Status: Full Code Family Communication: No family present at bedside  Disposition Plan:  Level of care: Med-Surg Status is: Inpatient Remains inpatient appropriate because: Anticipating discharging home in the next 24 to 48 hours   Consultants:  General Surgery  Procedures:  As delineated as above  Antimicrobials:  Anti-infectives (From admission, onward)    None       Subjective: Seen and examined at bedside and patient states he is doing much better having quite a bit of ostomy output now.  States his abdominal pain is improving.  No nausea or vomiting.  States he removed his NG tube this morning. No other concerns or complaints at this time.   Objective: Vitals:   09/25/22 0513 09/25/22 1332 09/25/22 1959 09/26/22 1411  BP: (!) 161/67 (!) 150/72 115/75 (!) 156/61  Pulse: 87 87 97 83  Resp: 18 18 16 18   Temp: 98.7 F (37.1 C) 99 F (37.2 C) (!) 97.4 F (36.3 C) (!) 97.5 F (36.4 C)  TempSrc:   Oral   SpO2: 96% 95% 95% 99%  Weight:      Height:        Intake/Output Summary (Last 24 hours) at 09/26/2022 1719 Last data filed at 09/26/2022 1600 Gross per 24 hour  Intake 646.21 ml  Output 4080 ml  Net -3433.79 ml   Filed Weights   09/24/22 1158 09/24/22 1304  Weight: (!) 164.7 kg (!) 169.8 kg   Examination: Physical Exam:  Constitutional: Super morbidly obese.  Chronically ill-appearing Caucasian male in no acute  distress Respiratory: Diminished to auscultation bilaterally, no wheezing, rales, rhonchi or crackles. Normal respiratory effort and patient is not tachypenic. No accessory muscle use.  Unlabored breathing Cardiovascular: RRR, no murmurs / rubs / gallops. S1 and S2 auscultated. No extremity edema.  Abdomen: Soft, mildly.-tender, patient has a a very large hernia noted in ostomy bag and abdominal distention. Bowel sounds positive.  GU: Deferred. Musculoskeletal: No clubbing / cyanosis of digits/nails. No joint deformity upper and lower extremities Skin: No rashes, lesions, ulcers on limited skin evaluation. No induration; Warm and dry.  Neurologic: CN 2-12 grossly intact with no focal deficits. Romberg sign and cerebellar reflexes not assessed.  Psychiatric: Normal judgment and insight. Alert and oriented x 3. Normal mood and appropriate affect.   Data Reviewed: I have personally reviewed following labs and imaging studies  CBC: Recent Labs  Lab 09/24/22 0155 09/25/22 0045 09/26/22 0452  WBC 7.1 10.6* 7.4  NEUTROABS 5.2  --  5.4  HGB 11.0* 12.6* 12.7*  HCT 36.9* 43.5 43.4  MCV 90.0 91.8 91.9  PLT 250 237 236   Basic Metabolic Panel:  Recent Labs  Lab 09/24/22 0155 09/25/22 0045 09/26/22 0452  NA 132* 140 143  K 4.3 4.4 3.6  CL 102 107 110  CO2 21* 20* 23  GLUCOSE 391* 237* 216*  BUN 28* 22 33*  CREATININE 1.83* 1.44* 1.81*  CALCIUM 8.5* 8.7* 8.6*  MG  --   --  3.0*   GFR: Estimated Creatinine Clearance: 59.7 mL/min (A) (by C-G formula based on SCr of 1.81 mg/dL (H)). Liver Function Tests: Recent Labs  Lab 09/24/22 0155  AST 15  ALT 16  ALKPHOS 85  BILITOT 0.6  PROT 6.9  ALBUMIN 3.5   Recent Labs  Lab 09/24/22 0155  LIPASE 64*   No results for input(s): "AMMONIA" in the last 168 hours. Coagulation Profile: Recent Labs  Lab 09/24/22 0527  INR 1.1   Cardiac Enzymes: No results for input(s): "CKTOTAL", "CKMB", "CKMBINDEX", "TROPONINI" in the last 168  hours. BNP (last 3 results) No results for input(s): "PROBNP" in the last 8760 hours. HbA1C: Recent Labs    09/24/22 0839  HGBA1C 10.1*   CBG: Recent Labs  Lab 09/25/22 2109 09/26/22 0302 09/26/22 0734 09/26/22 1136 09/26/22 1648  GLUCAP 181* 202* 200* 214* 212*   Lipid Profile: No results for input(s): "CHOL", "HDL", "LDLCALC", "TRIG", "CHOLHDL", "LDLDIRECT" in the last 72 hours. Thyroid Function Tests: No results for input(s): "TSH", "T4TOTAL", "FREET4", "T3FREE", "THYROIDAB" in the last 72 hours. Anemia Panel: No results for input(s): "VITAMINB12", "FOLATE", "FERRITIN", "TIBC", "IRON", "RETICCTPCT" in the last 72 hours. Sepsis Labs: No results for input(s): "PROCALCITON", "LATICACIDVEN" in the last 168 hours.  Recent Results (from the past 240 hour(s))  MRSA Next Gen by PCR, Nasal     Status: None   Collection Time: 09/25/22 12:28 PM   Specimen: Nasal Mucosa; Nasal Swab  Result Value Ref Range Status   MRSA by PCR Next Gen NOT DETECTED NOT DETECTED Final    Comment: (NOTE) The GeneXpert MRSA Assay (FDA approved for NASAL specimens only), is one component of a comprehensive MRSA colonization surveillance program. It is not intended to diagnose MRSA infection nor to guide or monitor treatment for MRSA infections. Test performance is not FDA approved in patients less than 23 years old. Performed at [facility], [address], [place], [place] [address]     Radiology Studies: DG Abd Portable 1V  Result Date: 09/25/2022 CLINICAL DATA:  72 year old male with ventral abdominal hernia and CT appearance suspicious for SBO yesterday. EXAM: PORTABLE ABDOMEN - 1 VIEW COMPARISON:  0010 hours today and earlier. FINDINGS: Portable AP views of the abdomen and pelvis at 1026 hours. Enteric tube side hole still projects over the gas-filled gastric body. Stable visible lung bases. Increased gas in dilated small bowel loops in the left lower quadrant compared to the  CT scout view yesterday. And ongoing paucity of large bowel gas, including the right colon. Underlying large in complex ventral abdominal hernia better demonstrated by CT. Additionally, gastric contrast was present earlier today, and now conveyed identified in the right abdomen and over the pelvis. No acute osseous abnormality identified.  Cardiac pacemaker leads. IMPRESSION: 1. Stable enteric tube terminating at the gastric body. 2. Complex ventral abdominal hernia better demonstrated by CT. Oral contrast visible in right abdominal and pelvic small bowel since 0010 hours today. Continued gas distended left abdominal small bowel loops, and paucity of large bowel gas. Constellation favors a partial small bowel obstruction. Electronically Signed   By: Odessa [place] M.D.   On: 09/25/2022  12:37   DG Abd Portable 1V-Small Bowel Obstruction Protocol-initial, 8 hr delay  Result Date: 09/25/2022 CLINICAL DATA:  SBO 8 hour delay EXAM: PORTABLE ABDOMEN - 1 VIEW COMPARISON:  September 24, 2022 (8:48 a.m.) FINDINGS: There is stable enteric tube positioning. The stomach remains distended with a mild amount of radiopaque contrast seen within the region of the gastric fundus. Distended loops of air-filled bowel are again seen within the visualized portion of the mid abdomen. IMPRESSION: Persistent gastric distention with a mild amount of radiopaque contrast seen within the region of the gastric fundus. Electronically Signed   By: Aram Candela M.D.   On: 09/25/2022 00:38    Scheduled Meds:  DULoxetine  60 mg Oral Daily   insulin aspart  0-15 Units Subcutaneous TID WC   insulin aspart  0-5 Units Subcutaneous QHS   insulin glargine-yfgn  35 Units Subcutaneous QHS   isosorbide mononitrate  60 mg Oral Daily   pantoprazole (PROTONIX) IV  40 mg Intravenous Q24H   rOPINIRole  2 mg Oral QHS   topiramate  25 mg Oral Daily   Continuous Infusions:  heparin 2,350 Units/hr (09/26/22 0254)    LOS: 2 days   Marguerita Merles, DO [facility] Available via Epic secure chat 7am-7pm After these hours, please refer to coverage provider listed on [email or web address] 09/26/2022, 5:19 PM

## 2022-09-26 NOTE — Progress Notes (Signed)
   09/26/22 2332  BiPAP/CPAP/SIPAP  BiPAP/CPAP/SIPAP Pt Type Adult  BiPAP/CPAP/SIPAP Resmed  Mask Type Nasal mask (from home)  Flow Rate 2 lpm  Patient Home Equipment No (mask and tubing from home)  Auto Titrate Yes (6-20)  CPAP/SIPAP surface wiped down Yes  BiPAP/CPAP /SiPAP Vitals  Pulse Rate 71  Resp 19  SpO2 96 %  Bilateral Breath Sounds Diminished  MEWS Score/Color  MEWS Score 0  MEWS Score Color Green

## 2022-09-26 NOTE — Progress Notes (Signed)
Subjective/Chief Complaint: Feels much better Denies abdominal pain and having a lot of ostomy output Took in a lot of ice/water yesterday   Objective: Vital signs in last 24 hours: Temp:  [97.4 F (36.3 C)-99 F (37.2 C)] 97.4 F (36.3 C) (08/06 1959) Pulse Rate:  [87-97] 97 (08/06 1959) Resp:  [16-18] 16 (08/06 1959) BP: (115-150)/(72-75) 115/75 (08/06 1959) SpO2:  [95 %] 95 % (08/06 1959) Last BM Date : 09/25/22  Intake/Output from previous day: 08/06 0701 - 08/07 0700 In: 0  Out: 5250 [Urine:1400; Emesis/NG output:2820; Stool:1030] Intake/Output this shift: No intake/output data recorded.  Exam: Awake and alert Abdomen soft, non-tender, chronic hernias  Lab Results:  Recent Labs    09/25/22 0045 09/26/22 0452  WBC 10.6* 7.4  HGB 12.6* 12.7*  HCT 43.5 43.4  PLT 237 236   BMET Recent Labs    09/25/22 0045 09/26/22 0452  NA 140 143  K 4.4 3.6  CL 107 110  CO2 20* 23  GLUCOSE 237* 216*  BUN 22 33*  CREATININE 1.44* 1.81*  CALCIUM 8.7* 8.6*   PT/INR Recent Labs    09/24/22 0527  LABPROT 14.4  INR 1.1   ABG No results for input(s): "PHART", "HCO3" in the last 72 hours.  Invalid input(s): "PCO2", "PO2"  Studies/Results: DG Abd Portable 1V  Result Date: 09/25/2022 CLINICAL DATA:  72 year old male with ventral abdominal hernia and CT appearance suspicious for SBO yesterday. EXAM: PORTABLE ABDOMEN - 1 VIEW COMPARISON:  0010 hours today and earlier. FINDINGS: Portable AP views of the abdomen and pelvis at 1026 hours. Enteric tube side hole still projects over the gas-filled gastric body. Stable visible lung bases. Increased gas in dilated small bowel loops in the left lower quadrant compared to the CT scout view yesterday. And ongoing paucity of large bowel gas, including the right colon. Underlying large in complex ventral abdominal hernia better demonstrated by CT. Additionally, gastric contrast was present earlier today, and now conveyed identified  in the right abdomen and over the pelvis. No acute osseous abnormality identified.  Cardiac pacemaker leads. IMPRESSION: 1. Stable enteric tube terminating at the gastric body. 2. Complex ventral abdominal hernia better demonstrated by CT. Oral contrast visible in right abdominal and pelvic small bowel since 0010 hours today. Continued gas distended left abdominal small bowel loops, and paucity of large bowel gas. Constellation favors a partial small bowel obstruction. Electronically Signed   By: Odessa [place] M.D.   On: 09/25/2022 12:37   DG Abd Portable 1V-Small Bowel Obstruction Protocol-initial, 8 hr delay  Result Date: 09/25/2022 CLINICAL DATA:  SBO 8 hour delay EXAM: PORTABLE ABDOMEN - 1 VIEW COMPARISON:  September 24, 2022 (8:48 a.m.) FINDINGS: There is stable enteric tube positioning. The stomach remains distended with a mild amount of radiopaque contrast seen within the region of the gastric fundus. Distended loops of air-filled bowel are again seen within the visualized portion of the mid abdomen. IMPRESSION: Persistent gastric distention with a mild amount of radiopaque contrast seen within the region of the gastric fundus. Electronically Signed   By: Aram Candela M.D.   On: 09/25/2022 00:38   DG Abd Portable 1V-Small Bowel Protocol-Position Verification  Result Date: 09/24/2022 CLINICAL DATA:  [id number] Encounter for imaging study to confirm nasogastric (NG) tube placement [id number] EXAM: PORTABLE ABDOMEN - 1 VIEW COMPARISON:  09/24/2022 FINDINGS: Limited radiograph of the lower chest and upper abdomen was obtained for the purposes of enteric tube localization. Enteric tube is seen coursing below the  diaphragm with distal tip and side port terminating within the expected location of the gastric body. Small left pleural effusion. IMPRESSION: Enteric tube terminates within the expected location of the gastric body. Electronically Signed   By: Duanne Guess D.O.   On: 09/24/2022 09:11     Anti-infectives: Anti-infectives (From admission, onward)    None       Assessment/Plan:  SBO with large ventral hernias vs loss of domain  - Hx open appendectomy; Hartmans for diverticulitis with reversal in 1989; ischemic transverse colon s/p resection and ascending colostomy 02/2015 Dr. Harlan Stains in [facility], he was left open and went back for washout and closure the following day  Recent TIA vs CVA 10/2021 DM HLD OSA Chronic respiratory failure on home O2 Morbid obesity BMI 48.82 SSS s/p PPM CHF CAD with h/o cardiac stents HTN CKD-IV chronic anticoagulation use on Eliquis (last dose 8/4am)     Improving Having ostomy output Will d/c NG and start po today Hold Eliquis for now      Abigail Miyamoto MD 09/26/2022

## 2022-09-26 NOTE — TOC Initial Note (Signed)
Transition of Care [facility]) - Initial/Assessment Note    Patient Details  Name: Jesse [name] MRN: [id number] Date of Birth: 11/30/1950  Transition of [facility]) CM/SW Contact:    Otelia Santee, LCSW Phone Number: 09/26/2022, 1:26 PM  Clinical Narrative:                 Met with pt and confirmed plan to return home at discharge with home health services. Pt shares he has had home health services several times this past year with [facility]. Pt requests being seen by the same RN and PT with [facility].  HHPT/RN has been arranged w/ Cindie at [facility] who will inform office of specific clinician requests. HH order will need to be placed prior to discharge.   Expected Discharge Plan: Home/Self Care Barriers to Discharge: No Barriers Identified   Patient Goals and CMS Choice Patient states their goals for this hospitalization and ongoing recovery are:: To return home CMS Medicare.gov Compare Post Acute Care list provided to:: Patient Choice offered to / list presented to : Patient [place] ownership interest in [facility].provided to:: Patient    Expected Discharge Plan and Services In-house Referral: Clinical Social Work Discharge Planning Services: NA Post Acute Care Choice: Home Health Living arrangements for the past 2 months: Apartment                 DME Arranged: N/A DME Agency: NA       HH Arranged: RN, PT HH AgencyHotel manager Home Health Care Date [facility] Agency Contacted: 09/26/22 Time HH Agency Contacted: 1326 Representative spoke with at [facility] Agency: Cindie  Prior Living Arrangements/Services Living arrangements for the past 2 months: Apartment Lives with:: Self Patient language and need for interpreter reviewed:: Yes Do you feel safe going back to the place where you live?: Yes      Need for Family Participation in Patient Care: No (Comment) Care giver support system in place?: No (comment) Current home services: DME (electric wheelchair) Criminal Activity/Legal  Involvement Pertinent to Current Situation/Hospitalization: No - Comment as needed  Activities of Daily Living Home Assistive Devices/Equipment: Eyeglasses, Hearing aid, CPAP, Wheelchair, Shower chair with back ADL Screening (condition at time of admission) Patient's cognitive ability adequate to safely complete daily activities?: Yes Is the patient deaf or have difficulty hearing?: Yes Does the patient have difficulty seeing, even when wearing glasses/contacts?: No Does the patient have difficulty concentrating, remembering, or making decisions?: No Patient able to express need for assistance with ADLs?: No Does the patient have difficulty dressing or bathing?: No Independently performs ADLs?: Yes (appropriate for developmental age) Does the patient have difficulty walking or climbing stairs?: Yes Weakness of Legs: Both Weakness of Arms/Hands: None  Permission Sought/Granted   Permission granted to share information with : No              Emotional Assessment Appearance:: Appears stated age Attitude/Demeanor/Rapport: Engaged Affect (typically observed): Accepting Orientation: : Oriented to Self, Oriented to Place, Oriented to  Time, Oriented to Situation Alcohol / Substance Use: Not Applicable Psych Involvement: No (comment)  Admission diagnosis:  Small bowel obstruction (HCC) [K56.609] SBO (small bowel obstruction) (HCC) [K56.609] Renal mass [N28.89] Patient Active Problem List   Diagnosis Date Noted   Paroxysmal atrial fibrillation (HCC) 07/11/2022   Hypercoagulable state due to persistent atrial fibrillation (HCC) 07/11/2022   Renal mass 05/21/2022   Obesity hypoventilation syndrome (HCC) 05/21/2022   Bleeding from colostomy stoma (HCC) 04/16/2022   Colostomy complication (HCC) 04/16/2022  Wheelchair dependent 02/09/2022   SBO (small bowel obstruction) (HCC) 02/09/2022   CKD (chronic kidney disease) stage 4, GFR 15-29 ml/min (HCC) - baseline SCr 2.2-2.5 02/09/2022    right facial numbness 11/01/2021   Essential hypertension 11/01/2021   Hyperlipidemia 11/01/2021   Colostomy care (HCC) 11/01/2021   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  09/15/2021   Bradycardia 09/01/2021   Benign hypertension with CKD (chronic kidney disease) stage III (HCC) 06/18/2021   Chronic systolic congestive heart failure (HCC) 06/18/2021   Generalized anxiety disorder 06/18/2021   Type 2 diabetes mellitus with hyperlipidemia (HCC) 05/14/2021   Chronic anemia 05/14/2021   OSA (obstructive sleep apnea) 05/13/2021   Chronic respiratory failure with hypoxia (HCC) 05/13/2021   Obesity, Class III, BMI 40-49.9 (morbid obesity) (HCC) 05/13/2021   Chronic pain of both knees 12/08/2020   Anxiety 12/08/2020   Type 2 diabetes mellitus (HCC)    Former cigarette smoker    Alcohol use disorder in remission    Hard of hearing    Abscess 04/01/2015   Anemia 03/07/2015   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 [place] [address] Phone: [phone] Fax: [phone]     Social Determinants of Health (SDOH) Social History: SDOH Screenings   Food Insecurity: No Food Insecurity (09/24/2022)  Housing: Low Risk  (09/24/2022)  Transportation Needs: No Transportation Needs (09/24/2022)  Utilities: Not At Risk (09/24/2022)  Alcohol Screen: Low Risk  (05/15/2021)  Depression (PHQ2-9): Low Risk  (06/28/2022)  Financial Resource Strain: Low Risk  (05/15/2021)  Tobacco Use: Medium Risk (09/25/2022)   SDOH Interventions:     Readmission Risk Interventions    09/25/2022   12:45 PM 05/15/2021    2:21 PM   Readmission Risk Prevention Plan  Transportation Screening Complete Complete  PCP or Specialist Appt within 3-5 Days Complete Complete  HRI or Home Care Consult Complete Complete  Social Work Consult for Recovery Care Planning/Counseling Complete Complete  Palliative Care Screening Not Applicable Not Applicable  Medication Review Oceanographer) Complete Complete

## 2022-09-26 NOTE — Progress Notes (Signed)
ANTICOAGULATION CONSULT NOTE - follow up  Pharmacy Consult for Heparin Indication: atrial fibrillation  Allergies  Allergen Reactions   Tape Rash    If left on for a long time    Patient Measurements: Height: 6' (182.9 cm) Weight: (!) 169.8 kg (374 lb 5.5 oz) IBW/kg (Calculated) : 77.6 Heparin Dosing Weight: 117.3 kg  Vital Signs: Temp: 97.4 F (36.3 C) (08/06 1959) Temp Source: Oral (08/06 1959) BP: 115/75 (08/06 1959) Pulse Rate: 97 (08/06 1959)  Labs: Recent Labs    09/24/22 0155 09/24/22 0155 09/24/22 0527 09/24/22 1245 09/25/22 0045 09/25/22 1131 09/25/22 2035 09/26/22 0452  HGB 11.0*  --   --   --  12.6*  --   --  12.7*  HCT 36.9*  --   --   --  43.5  --   --  43.4  PLT 250  --   --   --  237  --   --  236  APTT  --    < > 31  --  35 49* 68* 58*  LABPROT  --   --  14.4  --   --   --   --   --   INR  --   --  1.1  --   --   --   --   --   HEPARINUNFRC  --   --   --  1.03* 0.69  --   --  0.60  CREATININE 1.83*  --   --   --  1.44*  --   --  1.81*   < > = values in this interval not displayed.    Estimated Creatinine Clearance: 59.7 mL/min (A) (by C-G formula based on SCr of 1.81 mg/dL (H)).  Medications:  Eliquis 5mg  bid PTA, last dose: 8/4 morning   Assessment: Jesse [name] is a 72 y.o. male with medical history significant for obesity, chronic diastolic congestive heart failure, insulin-dependent type 2 diabetes, CKD stage IV, hypertension, atrial fibrillation on Eliquis, multiple abdominal surgeries and complicated abdominal history being admitted to the hospital with recurrent small bowel obstruction. Presented to ED with abdominal pain.  CT notable for SBO.  General surgery following.  Pharmacy has been consulted to dose IV Heparin in case patient needs surgery during admission.   Use of Eliquis falsely elevates heparin levels so will monitor therapy with aPTT until effects worn off and aPTT and heparin levels correlate.  Today, 09/26/22   Heparin level therapeutic on current IV heparin rate of 2350 units/hr.  Will proceed with checking heparin level only from this point and d/c aPTT levels CBC stable No reported bleeding per notes  Goal of Therapy:  - Heparin level 0.3-0.7 units/ml - aPTT 66-102 seconds - Monitor platelets by anticoagulation protocol: Yes    Plan:  Continue Heparin drip at 2350 units/hr Daily heparin level and CBC Continue to monitor H&H and platelets Follow plans for SBO   Hessie Knows, PharmD, BCPS Secure Chat if ?s 09/26/2022 7:07 AM

## 2022-09-26 NOTE — Inpatient Diabetes Management (Signed)
Inpatient Diabetes Program Recommendations  AACE/ADA: New Consensus Statement on Inpatient Glycemic Control (2015)  Target Ranges:  Prepandial:   less than 140 mg/dL      Peak postprandial:   less than 180 mg/dL (1-2 hours)      Critically ill patients:  140 - 180 mg/dL   Lab Results  Component Value Date   GLUCAP 214 (H) 09/26/2022   HGBA1C 10.1 (H) 09/24/2022    Review of Glycemic Control  Diabetes history: DM2 Outpatient Diabetes medications: Lantus 50 at bedtime, Jardiance 25 mg every day, Victoza 1.8 mg QD Current orders for Inpatient glycemic control: Semglee 25 units at bedtime, Novolog 0-15 TID with meals and 0-5 HS  HgbA1C - 10.1% On FL diet now  Inpatient Diabetes Program Recommendations:    Increase Semglee to 35 units at bedtime  Spoke with pt at bedside regarding his diabetes control and HgbA1C of 10.1%. Monitors blood sugars daily with glucose meter. Tries to make healthier choices with food intake and watch portion sizes. Has appt with Endo on Mon, 10/01/22 @ 10:30 am. Has been waiting several months to get appt with Endo. Discussed CGM with pt and will give information prior to discharge. Discussed glucose and HgbA1C goals of < 8%. Denies any hypoglycemia. Discussed s/s and treatment. Takes meds as prescribed for his diabetes.  Answered all questions. Pt appreciative of visit.  Thank you. Ailene Ards, RD, LDN, CDCES Inpatient Diabetes Coordinator [phone]

## 2022-09-27 ENCOUNTER — Inpatient Hospital Stay (HOSPITAL_COMMUNITY): Payer: Medicare PPO

## 2022-09-27 DIAGNOSIS — K436 Other and unspecified ventral hernia with obstruction, without gangrene: Secondary | ICD-10-CM | POA: Diagnosis not present

## 2022-09-27 DIAGNOSIS — N184 Chronic kidney disease, stage 4 (severe): Secondary | ICD-10-CM | POA: Diagnosis not present

## 2022-09-27 DIAGNOSIS — K56609 Unspecified intestinal obstruction, unspecified as to partial versus complete obstruction: Secondary | ICD-10-CM | POA: Diagnosis not present

## 2022-09-27 LAB — FOLATE: Folate: 11.3 ng/mL (ref >5.9)

## 2022-09-27 LAB — GLUCOSE, CAPILLARY
Glucose-Capillary: 167 mg/dL — ABNORMAL HIGH (ref 70–99)
Glucose-Capillary: 190 mg/dL — ABNORMAL HIGH (ref 70–99)

## 2022-09-27 LAB — IRON AND TIBC
Iron: 44 ug/dL — ABNORMAL LOW (ref 45–182)
Saturation Ratios: 13 % — ABNORMAL LOW (ref 17.9–39.5)
TIBC: 336 ug/dL (ref 250–450)
UIBC: 292 ug/dL

## 2022-09-27 LAB — RETICULOCYTES
Immature Retic Fract: 23.3 % — ABNORMAL HIGH (ref 2.3–15.9)
RBC.: 4.38 MIL/uL (ref 4.22–5.81)
Retic Count, Absolute: 84.1 10*3/uL (ref 19.0–186.0)
Retic Ct Pct: 1.9 % (ref 0.4–3.1)

## 2022-09-27 LAB — FERRITIN: Ferritin: 19 ng/mL — ABNORMAL LOW (ref 24–336)

## 2022-09-27 LAB — VITAMIN B12: Vitamin B-12: 712 pg/mL (ref 180–914)

## 2022-09-27 MED ORDER — APIXABAN 5 MG PO TABS: 5.0000 mg | ORAL_TABLET | Freq: Two times a day (BID) | ORAL | Status: DC

## 2022-09-27 MED ORDER — POTASSIUM CHLORIDE CRYS ER 20 MEQ PO TBCR
40.0000 meq | EXTENDED_RELEASE_TABLET | Freq: Once | ORAL | Status: AC
  Administered 2022-09-27: 40 meq via ORAL
  Filled 2022-09-27: qty 2

## 2022-09-27 MED ORDER — K PHOS MONO-SOD PHOS DI & MONO 155-852-130 MG PO TABS
500.0000 mg | ORAL_TABLET | Freq: Two times a day (BID) | ORAL | Status: DC
  Filled 2022-09-27: qty 2

## 2022-09-27 MED ORDER — POTASSIUM PHOSPHATES 15 MMOLE/5ML IV SOLN
30.0000 mmol | Freq: Once | INTRAVENOUS | Status: DC
  Administered 2022-09-27: 30 mmol via INTRAVENOUS
  Filled 2022-09-27: qty 10

## 2022-09-27 NOTE — Plan of Care (Signed)
  Problem: Education: Goal: Knowledge of General Education information will improve Description: Including pain rating scale, medication(s)/side effects and non-pharmacologic comfort measures Outcome: Not Progressing   Problem: Health Behavior/Discharge Planning: Goal: Ability to manage health-related needs will improve Outcome: Not Progressing   Problem: Clinical Measurements: Goal: Ability to maintain clinical measurements within normal limits will improve Outcome: Not Progressing Goal: Will remain free from infection Outcome: Not Progressing Goal: Diagnostic test results will improve Outcome: Not Progressing Goal: Respiratory complications will improve Outcome: Not Progressing Goal: Cardiovascular complication will be avoided Outcome: Not Progressing   Problem: Activity: Goal: Risk for activity intolerance will decrease Outcome: Not Progressing   Problem: Nutrition: Goal: Adequate nutrition will be maintained Outcome: Not Progressing   Problem: Coping: Goal: Level of anxiety will decrease Outcome: Not Progressing   Problem: Elimination: Goal: Will not experience complications related to bowel motility Outcome: Not Progressing Goal: Will not experience complications related to urinary retention Outcome: Not Progressing   Problem: Pain Managment: Goal: General experience of comfort will improve Outcome: Not Progressing   Problem: Safety: Goal: Ability to remain free from injury will improve Outcome: Not Progressing   Problem: Skin Integrity: Goal: Risk for impaired skin integrity will decrease Outcome: Not Progressing  Pt A/Ox4 on 2L [place], CPAP at night. X1 dose IV dilaudid administered for pain. Tolerating diet, no nausea. Liquid output from colostomy.

## 2022-09-27 NOTE — Consult Note (Signed)
   [facility] Inpatient Consult   09/27/2022  SKIPPER MACHIN Oct 28, 1950 [id number]  [facility] [[facility]]  Accountable Care Organization [ACO] Patient: [facility] Medicare  Primary Care Provider: Caesar Bookman, NP with [facility]   *[facility] Liaison remote coverage review for patient admitted to [facility]    Patient evaluated for community based chronic complex disease management services with [facility] Care Management Program as a benefit of patient's [employer]. Patient transitioned home with [facility], admitted with SBO however review reveals also patient's Hemoglobin A1C is >10.Marland Kitchen   Plan: Patient will receive post hospital discharge call and will be evaluated for ongoing community care coordination for diabetes management support. Anticipate TOC follow up in community.     Of note, [facility] Care Management services does not replace or interfere with any services that are arranged by inpatient case management or social work.  For additional questions or referrals please contact:    Charlesetta Shanks, RN BSN CCM [place]rez  [facility]  [phone] business mobile phone Toll free office [phone]  *Concierge Line  [phone] Fax number: [phone] [email or web address]

## 2022-09-27 NOTE — Progress Notes (Signed)
Subjective: CC: Tolerating FLD (+crackers from home) without abdominal pain, n/v. Having ostomy output.   Objective: Vital signs in last 24 hours: Temp:  [97.5 F (36.4 C)-98.8 F (37.1 C)] 98.3 F (36.8 C) (08/08 0546) Pulse Rate:  [69-83] 69 (08/08 0546) Resp:  [18-20] 20 (08/08 0546) BP: (126-156)/(60-72) 127/72 (08/08 0546) SpO2:  [94 %-99 %] 94 % (08/08 0546) Last BM Date : 09/26/22  Intake/Output from previous day: 08/07 0701 - 08/08 0700 In: 646.2 [I.V.:646.2] Out: 450 [Emesis/NG output:450] Intake/Output this shift: No intake/output data recorded.  PE: Gen:  Alert, NAD, pleasant Abd: Completely NT. Chronic hernias are much softer than when I first saw him. +BS. Ostomy liquid stool in bag.   Lab Results:  Recent Labs    09/26/22 0452 09/27/22 0606  WBC 7.4 6.5  HGB 12.7* 11.6*  HCT 43.4 39.4  PLT 236 212   BMET Recent Labs    09/26/22 0452 09/27/22 0606  NA 143 135  K 3.6 3.2*  CL 110 102  CO2 23 23  GLUCOSE 216* 185*  BUN 33* 27*  CREATININE 1.81* 1.75*  CALCIUM 8.6* 8.3*   PT/INR No results for input(s): "LABPROT", "INR" in the last 72 hours. CMP     Component Value Date/Time   NA 135 09/27/2022 0606   NA 137 06/06/2022 0000   K 3.2 (L) 09/27/2022 0606   CL 102 09/27/2022 0606   CO2 23 09/27/2022 0606   GLUCOSE 185 (H) 09/27/2022 0606   BUN 27 (H) 09/27/2022 0606   BUN 35 (A) 06/06/2022 0000   CREATININE 1.75 (H) 09/27/2022 0606   CREATININE 2.21 (H) 02/20/2022 1443   CALCIUM 8.3 (L) 09/27/2022 0606   PROT 6.8 09/27/2022 0606   ALBUMIN 3.3 (L) 09/27/2022 0606   AST 15 09/27/2022 0606   ALT 12 09/27/2022 0606   ALKPHOS 74 09/27/2022 0606   BILITOT 0.8 09/27/2022 0606   GFRNONAA 41 (L) 09/27/2022 0606   GFRAA (L) 04/08/2008 1240    54        The eGFR has been calculated using the MDRD equation. This calculation has not been validated in all clinical situations. eGFR's persistently <60 mL/min signify possible Chronic  Kidney Disease.   Lipase     Component Value Date/Time   LIPASE 64 (H) 09/24/2022 0155    Studies/Results: DG Abd Portable 1V  Result Date: 09/25/2022 CLINICAL DATA:  72 year old male with ventral abdominal hernia and CT appearance suspicious for SBO yesterday. EXAM: PORTABLE ABDOMEN - 1 VIEW COMPARISON:  0010 hours today and earlier. FINDINGS: Portable AP views of the abdomen and pelvis at 1026 hours. Enteric tube side hole still projects over the gas-filled gastric body. Stable visible lung bases. Increased gas in dilated small bowel loops in the left lower quadrant compared to the CT scout view yesterday. And ongoing paucity of large bowel gas, including the right colon. Underlying large in complex ventral abdominal hernia better demonstrated by CT. Additionally, gastric contrast was present earlier today, and now conveyed identified in the right abdomen and over the pelvis. No acute osseous abnormality identified.  Cardiac pacemaker leads. IMPRESSION: 1. Stable enteric tube terminating at the gastric body. 2. Complex ventral abdominal hernia better demonstrated by CT. Oral contrast visible in right abdominal and pelvic small bowel since 0010 hours today. Continued gas distended left abdominal small bowel loops, and paucity of large bowel gas. Constellation favors a partial small bowel obstruction. Electronically Signed   By: Rexene Edison  Margo Aye M.D.   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 Harlan Stains [facility], he was left open and went back for washout and closure the following day - Clinically appears to be resolving. Abdominal pain has resolved, tolerating FLD without n/v, having ostomy output, chronic hernia's are  soft and his abdomen is completely NT. Will adv to soft diet. If he tolerates this, he is okay for discharge from our standpoint.  - Any type of elective attempt at a hernia repair would need to be at a tertiary care center   FEN - Soft diet. IVF per TRH VTE - SCDs, on heparin gtt. Okay for PO DOAC ID - None  Hx TIA  DM HLD OSA Chronic respiratory failure on home O2 Morbid obesity BMI 48.82 SSS s/p PPM CHF CAD with h/o cardiac stents HTN CKD-IV Chronic anticoagulation use on Eliquis (last dose 8/4am) Incidental findings - Left renal mass - recommend f/u for further evaluation   I reviewed nursing notes, last 24 h vitals and pain scores, last 48 h intake and output, last 24 h labs and trends, and last 24 h imaging results.   LOS: 3 days    Jacinto Halim , [facility] Surgery 09/27/2022, 8:15 AM Please see Amion for pager number during day hours 7:00am-4:30pm

## 2022-09-27 NOTE — TOC Transition Note (Signed)
Transition of Care [facility]) - CM/SW Discharge Note   Patient Details  Name: Jesse [name] MRN: [id number] Date of Birth: [birth year]  [facility] of [facility] CM/SW Contact:  Otelia Santee, LCSW Phone Number: 09/27/2022, 10:15 AM   Clinical Narrative:    Pt is to return home with HHPT/RN through [facility]. Pt needing PTAR for transportation home. PTAR called at  10:10am for transportation home.    Final next level of care: Home w Home Health Services Barriers to Discharge: No Barriers Identified   Patient Goals and CMS Choice CMS Medicare.gov Compare Post Acute Care list provided to:: Patient Choice offered to / list presented to : Patient  Discharge Placement                         Discharge Plan and Services Additional resources added to the After Visit Summary for   In-house Referral: Clinical Social Work Discharge Planning Services: NA Post Acute Care Choice: Home Health          DME Arranged: N/A DME Agency: NA       HH Arranged: RN, PT HH Agency: [facility] Date [facility] Agency Contacted: 09/26/22 Time HH Agency Contacted: 1326 Representative spoke with at [facility] Agency: Cindie  Social Determinants of Health (SDOH) Interventions SDOH Screenings   Food Insecurity: No Food Insecurity (09/24/2022)  Housing: Low Risk  (09/24/2022)  Transportation Needs: No Transportation Needs (09/24/2022)  Utilities: Not At Risk (09/24/2022)  Alcohol Screen: Low Risk  (05/15/2021)  Depression (PHQ2-9): Low Risk  (06/28/2022)  Financial Resource Strain: Low Risk  (05/15/2021)  Tobacco Use: Medium Risk (09/25/2022)     Readmission Risk Interventions    09/25/2022   12:45 PM 05/15/2021    2:21 PM  Readmission Risk Prevention Plan  Transportation Screening Complete Complete  PCP or Specialist Appt within 3-5 Days Complete Complete  HRI or Home Care Consult Complete Complete  Social Work Consult for Recovery Care Planning/Counseling Complete Complete  Palliative Care Screening  Not Applicable Not Applicable  Medication Review Oceanographer) Complete Complete

## 2022-09-27 NOTE — Progress Notes (Signed)
ANTICOAGULATION CONSULT NOTE - follow up  Pharmacy Consult for Heparin - transition back to apixaban 8/8 AM Indication: atrial fibrillation  Allergies  Allergen Reactions   Tape Rash    If left on for a long time    Patient Measurements: Height: 6' (182.9 cm) Weight: (!) 169.8 kg (374 lb 5.5 oz) IBW/kg (Calculated) : 77.6 Heparin Dosing Weight: 117.3 kg  Vital Signs: Temp: 98.3 F (36.8 C) (08/08 0546) Temp Source: Oral (08/08 0546) BP: 127/72 (08/08 0546) Pulse Rate: 69 (08/08 0546)  Labs: Recent Labs    09/25/22 0045 09/25/22 1131 09/25/22 2035 09/26/22 0452 09/27/22 0606  HGB 12.6*  --   --  12.7* 11.6*  HCT 43.5  --   --  43.4 39.4  PLT 237  --   --  236 212  APTT 35 49* 68* 58*  --   HEPARINUNFRC 0.69  --   --  0.60 >1.10*  CREATININE 1.44*  --   --  1.81* 1.75*    Estimated Creatinine Clearance: 61.8 mL/min (A) (by C-G formula based on SCr of 1.75 mg/dL (H)).  Medications:  Eliquis 5mg  bid PTA, last dose: 8/4 morning   Assessment: Jesse [name] is a 72 y.o. male with medical history significant for obesity, chronic diastolic congestive heart failure, insulin-dependent type 2 diabetes, CKD stage IV, hypertension, atrial fibrillation on Eliquis, multiple abdominal surgeries and complicated abdominal history being admitted to the hospital with recurrent small bowel obstruction. Presented to ED with abdominal pain.  CT notable for SBO.  General surgery following.  Pharmacy has been consulted to dose IV Heparin in case patient needs surgery during admission.   Use of Eliquis falsely elevates heparin levels so will monitor therapy with aPTT until effects worn off and aPTT and heparin levels correlate.  Today, 09/27/22  Heparin level now SUPRAtherapeutic on current IV heparin rate of 2350 units/hr.  CBC stable Per RN, heparin drawn from same arm as heparin running and could have been drom same vein possibly, will order redraw  Goal of Therapy:  - Heparin  level 0.3-0.7 units/ml - aPTT 66-102 seconds - Monitor platelets by anticoagulation protocol: Yes    Plan:  Discontinue IV heparin and start back patient's home apixaban 5mg  BID Continue to monitor H&H and platelets Follow plans for SBO   Hessie Knows, PharmD, BCPS Secure Chat if ?s 09/27/2022 7:28 AM

## 2022-09-27 NOTE — Discharge Summary (Signed)
Physician Discharge Summary   Patient: Jesse [name] MRN: [id number] DOB: January 30, 1951  Admit date:     09/24/2022  Discharge date: 09/29/22  Discharge Physician: Marguerita Merles, DO   PCP: Caesar Bookman, NP   Recommendations at discharge:   Follow-up with PCP within 1 to 2 weeks repeat CBC, CMP, mag and Phos within 1 week Follow-up with general surgery in outpatient setting Follow-up with [facility] for further workup of left renal mass  Discharge Diagnoses: Principal Problem:   SBO (small bowel obstruction) (HCC) Active Problems:   Ventral hernia   CKD (chronic kidney disease) stage 4, GFR 15-29 ml/min (HCC) - baseline SCr 2.2-2.5   Obesity, Class III, BMI 40-49.9 (morbid obesity) (HCC)   Hyperlipidemia   Chronic respiratory failure with hypoxia (HCC)   Type 2 diabetes mellitus (HCC)   OSA (obstructive sleep apnea)   Chronic systolic congestive heart failure (HCC)   Essential hypertension   Wheelchair dependent   Paroxysmal atrial fibrillation (HCC)  Resolved Problems:   * No resolved hospital problems. [facility] Course: Mr. 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 Also PMH PAF, TIA, DMII, chronic hypoxia on 2 L home oxygen, SSS s/p PPM, dCHF, CAD, CKD4.  He presented with onset of abdominal pain notably in the left upper quadrant consistent with prior locations of SBO's.  Denied any nausea/vomiting.  He presented for further workup and evaluation. CT abdomen/pelvis showed multiple dilated loops of small bowel in the abdomen with suspected transition point.  Also left renal mass noted which had increased from prior size. He underwent NG tube placement and general surgery was consulted.  **Interim History Patient abdominal pain is improving and he started having a lot of ostomy output and took a lot of ice and  water yesterday.  Surgery is following and patient subsequently removed his NG tube and states that he is feeling better.  Diet is being advanced slowly and he is now on a full liquid diet and tolerated this well and was then advance to soft which she then also tolerated.  PT OT recommending home health.  He is deemed medically stable now after able to tolerate diet and general surgery signed off the case.  Assessment and Plan:  Small Bowel Obstruction -Hx similar; has large left sided chronic ventral hernia -Hx ostomy placement after ischemic transverse colon resection and ascending ostomy placed 2017 -Abdominal pain from admission has improved some; still high output from NG tube however his NG tube was removed and now general surgery slowly advancing his diet and going to a full liquid diet given his improvement; Patient tolerated this well and diet was advanced and general surgery felt that if he is able to tolerate solids he could be discharged.  Patient was able to tolerate soft so he was discharged only to follow-up with PCP and neurosurgery outpatient setting -Some liquid noted in ostomy bag -General Surgery following -Continue with mobilization and ensure electrolytes are optimized   Insulin-Dependent Type 2 Diabetes Mellitus  -A1c 10.1% on admission -Glucose and CBG Trend: Recent Labs  Lab 09/24/22 0155 09/25/22 0045 09/26/22 0452 09/27/22 0606  GLUCOSE 391* 237* 216* 185*   Recent Labs  Lab 09/26/22 0302 09/26/22 0734 09/26/22 1136 09/26/22 1648 09/26/22 2127 09/27/22 0544 09/27/22 0737  GLUCAP 202* 200* 214* 212* 145* 167* 190*  -Continue insulin regimen with  Semglee but increase from 25 units nightly to 35 units nightly and continue with moderate NovoLog sliding scale insulin before meals and at bedtime and Adjust Regimen as necessary -Follow-up in outpatient setting   PAF -On Anticoagulation with Eliquis at home -Continue holding Eliquis and continue heparin drip  in case of need for intervention -Pharmacy Managing Heparin gtt for now and resumed heparin prior to discharge   Chronic dCHF -No S/Sx of exacerbation  -Continue current regimen -Strict I's and O's and Daily Weights; No intake or output data in the 24 hours ending 09/29/22 1941 -Judicious and cautious use of hydration  Hypokalemia -Patient's K+ Level Trend: Recent Labs  Lab 09/24/22 0155 09/25/22 0045 09/26/22 0452 09/27/22 0606  K 4.3 4.4 3.6 3.2*  -Repleted prior to discharge with K-Phos neutral and p.o. KCl -Continue to Monitor and Replete as Necessary -Repeat CMP within 1 week  Hypophosphatemia -Patient's Phos Level Trend: Recent Labs  Lab 09/27/22 0606  PHOS 1.8*  -Replete with IV K Phos 30 mmol however did not finish so was given p.o. K-Phos Neutral -Continue to Monitor and Replete as Necessary -Repeat Phos Level within 1 week  GERD/GI Prophylaxis -C/w PPI with Pantoprazole 40 mg po Daily    CKD stage IV -Patient has history of CKD4. Baseline creat ~ 1.92, eGFR~ upper 20s -Appears better than baseline currently -BUN/Cr Trend: Recent Labs  Lab 09/24/22 0155 09/25/22 0045 09/26/22 0452 09/27/22 0606  BUN 28* 22 33* 27*  CREATININE 1.83* 1.44* 1.81* 1.75*  -Avoid Nephrotoxic Medications, Contrast Dyes, Hypotension and Dehydration to Ensure Adequate Renal Perfusion and will need to Renally Adjust Meds -Continue to Monitor and Trend Renal Function carefully and repeat CMP within 1 week  Left Renal Mass -First identified in Dec 2023, and enlarging.   -Concerning for malignancy.  Discussion held with patient on admission for following up with urology outpatient and also discussed with his daughter   Chronic Debility -Uses electric WC for mobility at baseline -Resides at [facility] independent living  -Obtain PT/OT to evaluate and treat to work on strengthening and transfers and they are recommending home health  Normocytic Anemia -Hgb/Hct  Trend: Recent Labs  Lab 09/24/22 0155 09/25/22 0045 09/26/22 0452 09/27/22 0606  HGB 11.0* 12.6* 12.7* 11.6*  HCT 36.9* 43.5 43.4 39.4  MCV 90.0 91.8 91.9 90.0  -Check Anemia Panel and showed an iron level of 44, UIBC of 292, TIBC of 336, saturation ratios of 13%, ferritin of 19, folate level 1.3, vitamin B12 level of 712 -Continue to Monitor for S/Sx of Bleeding; No overt bleeding noted -Repeat CBC within 1 week and defer to PCP to start completion  Hypoalbuminemia -Patient's Albumin Trend: Recent Labs  Lab 09/24/22 0155 09/27/22 0606  ALBUMIN 3.5 3.3*  -Continue to Monitor and Trend and repeat CMP in the AM  Super Morbid Obesity -Complicates overall prognosis and care -Estimated body mass index is 50.77 kg/m as calculated from the following:   Height as of this encounter: 6' (1.829 m).   Weight as of this encounter: 169.8 kg.  -Weight Loss and Dietary Counseling given  Consultants: General Surgery Procedures performed: As delineated as above Disposition: Home health Diet recommendation:  Cardiac and Carb modified diet DISCHARGE MEDICATION: Allergies as of 09/27/2022       Reactions   Tape Rash   If left on for a long time        Medication List     TAKE these medications    Accu-Chek Guide test  strip Generic drug: glucose blood USE TO TEST BLOOD SUGAR ONCE DAILY   Accu-Chek Softclix Lancets lancets USE TO TEST BLOOD GLUCOSE ONCE DAILY AS DIRECTED   acetaminophen 325 MG tablet Commonly known as: TYLENOL Take 2 tablets (650 mg total) by mouth every 6 (six) hours as needed for mild pain or moderate pain.   alfuzosin 10 MG 24 hr tablet Commonly known as: UROXATRAL TAKE 1 TABLET BY MOUTH EVERY DAY   ALPRAZolam 0.5 MG tablet Commonly known as: XANAX TAKE 1 TABLET(0.5 MG) BY MOUTH TWICE DAILY AS NEEDED FOR ANXIETY OR SLEEP   apixaban 5 MG Tabs tablet Commonly known as: Eliquis Take 1 tablet (5 mg total) by mouth 2 (two) times daily.   calcium  carbonate 500 MG chewable tablet Commonly known as: TUMS - dosed in mg elemental calcium Chew 2 tablets by mouth every 8 (eight) hours as needed for indigestion or heartburn.   cyanocobalamin 1000 MCG tablet Take 1,000 mcg by mouth daily.   diclofenac Sodium 1 % Gel Commonly known as: VOLTAREN Apply 2 g topically 4 (four) times daily. Apply to your right neck and shoulder area 3-4 times a day as needed What changed:  when to take this reasons to take this   DULoxetine 60 MG capsule Commonly known as: CYMBALTA TAKE 1 CAPSULE(60 MG) BY MOUTH DAILY   furosemide 40 MG tablet Commonly known as: LASIX Take 80 mg by mouth 2 (two) times daily with breakfast and lunch.   Insulin Pen Needle 32G X 4 MM Misc Use to inject insulin twice daily. Dx: E11.22   isosorbide mononitrate 60 MG 24 hr tablet Commonly known as: IMDUR Take 1 tablet (60 mg total) by mouth daily.   Jardiance 25 MG Tabs tablet Generic drug: empagliflozin TAKE 1 TABLET BY MOUTH EVERY DAY   ketoconazole 2 % cream Commonly known as: NIZORAL Apply 1 application topically as needed for irritation.   Lancet Device Misc 1 Device by Does not apply route daily.   Lantus SoloStar 100 UNIT/ML Solostar Pen Generic drug: insulin glargine ADMINISTER 50 UNITS UNDER THE SKIN AT BEDTIME   ondansetron 4 MG tablet Commonly known as: Zofran Take 1 tablet (4 mg total) by mouth every 8 (eight) hours as needed for nausea or vomiting.   OXYGEN Inhale 2 L into the lungs continuous.   pantoprazole 40 MG tablet Commonly known as: PROTONIX TAKE 1 TABLET(40 MG) BY MOUTH TWICE DAILY BEFORE A MEAL   potassium chloride SA 20 MEQ tablet Commonly known as: KLOR-CON M Take 1 tablet (20 mEq total) by mouth 2 (two) times daily.   Red Yeast Rice 600 MG Caps Take 1,200 mg by mouth daily.   rOPINIRole 2 MG tablet Commonly known as: REQUIP TAKE 1 TABLET(2 MG) BY MOUTH AT BEDTIME   rosuvastatin 10 MG tablet Commonly known as:  CRESTOR TAKE 1 TABLET BY MOUTH EVERY DAY What changed: when to take this   simethicone 80 MG chewable tablet Commonly known as: MYLICON Chew 80 mg by mouth every 6 (six) hours as needed for flatulence.   tiZANidine 2 MG tablet Commonly known as: ZANAFLEX TAKE 1 TABLET BY MOUTH TWICE DAILY What changed:  when to take this reasons to take this   topiramate 25 MG tablet Commonly known as: TOPAMAX TAKE 1 TABLET BY MOUTH EVERY DAY   traMADol 50 MG tablet Commonly known as: ULTRAM TAKE 1 TABLET(50 MG) BY MOUTH TWICE DAILY AS NEEDED   traZODone 50 MG tablet Commonly known as: DESYREL  TAKE 2 TABLETS(100 MG) BY MOUTH AT BEDTIME   triamcinolone cream 0.1 % Commonly known as: KENALOG Apply 1 application. topically 2 (two) times daily. Apply to affected areas on shin area What changed:  when to take this reasons to take this additional instructions   Victoza 18 MG/3ML Sopn Generic drug: liraglutide ADMINISTER 1.8 MG UNDER THE SKIN EVERY DAY   Vitamin D (Ergocalciferol) 1.25 MG (50000 UNIT) Caps capsule Commonly known as: DRISDOL TAKE 1 CAPSULE BY MOUTH EVERY 7 DAYS        Follow-up Information     Ngetich, [name] C, NP. Call.   Specialty: Family Medicine Why: Follow up within 1-2 weeks Contact information: [address] [place] [address] [phone]         [facility] Follow up.   Why: Follow up for Renal Mass Evaluation Contact information: [address] [place] [address] [place] [address] [phone]               Discharge Exam: Ceasar Mons Weights   09/24/22 1158 09/24/22 1304  Weight: (!) 164.7 kg (!) 169.8 kg   Vitals:   09/26/22 2332 09/27/22 0546  BP:  127/72  Pulse: 71 69  Resp: 19 20  Temp:  98.3 F (36.8 C)  SpO2: 96% 94%   Examination: Physical Exam:  Constitutional: WN/WD chronically ill-appearing super morbidly obese Caucasian male in no acute distress appears calm and improved Respiratory: Diminished to  auscultation bilaterally, no wheezing, rales, rhonchi or crackles. Normal respiratory effort and patient is not tachypenic. No accessory muscle use.  Cardiovascular: RRR, no murmurs / rubs / gallops. S1 and S2 auscultated. No extremity edema.  Abdomen: Soft, non-tender, very distended secondary body habitus and has a very large ventral hernia and ostomy in place. GU: Deferred. Musculoskeletal: No clubbing / cyanosis of digits/nails. No joint deformity upper and lower extremities. Skin: No rashes, lesions, ulcers limited skin evaluation. No induration; Warm and dry.  Neurologic: CN 2-12 grossly intact with no focal deficits. Romberg sign and cerebellar reflexes not assessed.  Psychiatric: Normal judgment and insight. Alert and oriented x 3. Normal mood and appropriate affect.   Condition at discharge: stable  The results of significant diagnostics from this hospitalization (including imaging, microbiology, ancillary and laboratory) are listed below for reference.   Imaging Studies: DG Abd 1 View  Result Date: 09/27/2022 CLINICAL DATA:  Small-bowel obstruction EXAM: ABDOMEN - 1 VIEW COMPARISON:  KUB 2 days prior, CT abdomen/pelvis 3 days prior FINDINGS: The enteric catheter has been removed. There is significant gaseous distention of the stomach. There is a relative paucity of bowel gas in the remainder of the imaged abdomen. There is no definite free intraperitoneal air. Cardiac device leads are partially imaged. A left pleural effusion is partially imaged. There is no acute osseous abnormality. IMPRESSION: 1. Interval removal of the enteric catheter with significant gaseous distention of the stomach but a relative paucity of bowel gas in the remainder of the imaged abdomen. 2. Incompletely imaged left pleural effusion. Electronically Signed   By: Lesia Hausen M.D.   On: 09/27/2022 08:30   DG Abd Portable 1V  Result Date: 09/25/2022 CLINICAL DATA:  72 year old male with ventral abdominal hernia and  CT appearance suspicious for SBO yesterday. EXAM: PORTABLE ABDOMEN - 1 VIEW COMPARISON:  0010 hours today and earlier. FINDINGS: Portable AP views of the abdomen and pelvis at 1026 hours. Enteric tube side hole still projects over the gas-filled gastric body. Stable visible lung bases.  Increased gas in dilated small bowel loops in the left lower quadrant compared to the CT scout view yesterday. And ongoing paucity of large bowel gas, including the right colon. Underlying large in complex ventral abdominal hernia better demonstrated by CT. Additionally, gastric contrast was present earlier today, and now conveyed identified in the right abdomen and over the pelvis. No acute osseous abnormality identified.  Cardiac pacemaker leads. IMPRESSION: 1. Stable enteric tube terminating at the gastric body. 2. Complex ventral abdominal hernia better demonstrated by CT. Oral contrast visible in right abdominal and pelvic small bowel since 0010 hours today. Continued gas distended left abdominal small bowel loops, and paucity of large bowel gas. Constellation favors a partial small bowel obstruction. Electronically Signed   By: Odessa Fleming M.D.   On: 09/25/2022 12:37   DG Abd Portable 1V-Small Bowel Obstruction Protocol-initial, 8 hr delay  Result Date: 09/25/2022 CLINICAL DATA:  SBO 8 hour delay EXAM: PORTABLE ABDOMEN - 1 VIEW COMPARISON:  September 24, 2022 (8:48 a.m.) FINDINGS: There is stable enteric tube positioning. The stomach remains distended with a mild amount of radiopaque contrast seen within the region of the gastric fundus. Distended loops of air-filled bowel are again seen within the visualized portion of the mid abdomen. IMPRESSION: Persistent gastric distention with a mild amount of radiopaque contrast seen within the region of the gastric fundus. Electronically Signed   By: Aram Candela M.D.   On: 09/25/2022 00:38   DG Abd Portable 1V-Small Bowel Protocol-Position Verification  Result Date:  09/24/2022 CLINICAL DATA:  [id number] Encounter for imaging study to confirm nasogastric (NG) tube placement [id number] EXAM: PORTABLE ABDOMEN - 1 VIEW COMPARISON:  09/24/2022 FINDINGS: Limited radiograph of the lower chest and upper abdomen was obtained for the purposes of enteric tube localization. Enteric tube is seen coursing below the diaphragm with distal tip and side port terminating within the expected location of the gastric body. Small left pleural effusion. IMPRESSION: Enteric tube terminates within the expected location of the gastric body. Electronically Signed   By: Duanne Guess D.O.   On: 09/24/2022 09:11   DG Abd Portable 1 View  Result Date: 09/24/2022 CLINICAL DATA:  72 year old male status post nasogastric tube placement. EXAM: PORTABLE ABDOMEN - 1 VIEW COMPARISON:  Abdominal radiograph 05/22/2022. FINDINGS: Nasogastric tube extends into the proximal stomach with side port just distal to the gastroesophageal junction. The lower abdomen is incompletely imaged. In the visualized upper abdomen, bowel-gas pattern is unremarkable. Cardiomegaly. Pacemaker leads noted. IMPRESSION: 1. Nasogastric tube is in the proximal stomach and could be advanced approximately 10 cm for more optimal placement. Electronically Signed   By: Trudie Reed M.D.   On: 09/24/2022 05:32   CT ABDOMEN PELVIS W CONTRAST  Result Date: 09/24/2022 CLINICAL DATA:  Abdominal pain.  Bowel obstruction suspected. EXAM: CT ABDOMEN AND PELVIS WITH CONTRAST TECHNIQUE: Multidetector CT imaging of the abdomen and pelvis was performed using the standard protocol following bolus administration of intravenous contrast. RADIATION DOSE REDUCTION: This exam was performed according to the departmental dose-optimization program which includes automated exposure control, adjustment of the mA and/or kV according to patient size and/or use of iterative reconstruction technique. CONTRAST:  OMNIPAQUE IOHEXOL 300 MG/ML  SOLN COMPARISON:   05/21/2022. FINDINGS: Lower chest: A small pericardial effusion is noted. Pacemaker leads are seen in the heart. There is a small left pleural effusion with atelectasis or infiltrate. Mild atelectasis is noted at the right lung base. Hepatobiliary: The liver has a nodular contour, compatible with  underlying cirrhosis. Subcentimeter hypodensity is present in the left lobe of the liver, suggesting cyst. No biliary ductal dilatation. The gallbladder is without stones. Pancreas: Unremarkable. No pancreatic ductal dilatation or surrounding inflammatory changes. Spleen: Normal in size without focal abnormality. Adrenals/Urinary Tract: The adrenal glands are within normal limits. Renal cortical thinning is noted bilaterally. There is a masslike region in the lower pole of the left kidney measuring 4.3 cm, increased in size from the prior exam. No renal calculus or hydronephrosis. The bladder is unremarkable. Stomach/Bowel: There is a large complex ventral abdominal wall hernia in the midline containing the stomach, small bowel, and colon. Appendix is not seen. Mildly distended loops of small bowel are noted in the abdomen measuring up to 3.6 cm with air-fluid levels. There is a suspected transition point in the ventral abdominal wall hernia, not well delineated due to bowel clumping and field-of-view, however both distended and a collapsed loops of small bowel in the hernia sac. There is a left lower quadrant broad-based spigelian hernia with dilatation of a loop small bowel proximal to the efferent loop measuring 5.3 cm with transition point along the medial edge of the hernia. The distal small bowel is completely compressed. No free air or pneumatosis. A right lower quadrant colostomy is noted. Vascular/Lymphatic: Aortic atherosclerosis. No enlarged abdominal or pelvic lymph nodes. Reproductive: Prostate is unremarkable. Other: Small amount of free fluid is noted in the hernia sac in the midline. Musculoskeletal: No  acute osseous abnormality. IMPRESSION: 1. Multiple dilated loops of small bowel in the abdomen measuring up to 5.3 cm with suspected transition points in complex ventral and left spigelian large hernias, suspicious for bowel obstruction as detailed above. Surgical consultation is recommended. 2. Left renal mass measuring 4.3 cm, increased in size from the prior exam and concerning for malignancy. 3. Small left pleural effusion with atelectasis or infiltrate. 4. Morphologic changes of cirrhosis. 5. Aortic atherosclerosis. Critical Value/emergent results were called by telephone at the time of interpretation on 09/24/2022 at 3:41 am to provider Roxy Horseman , who verbally acknowledged these results. Electronically Signed   By: Thornell Sartorius M.D.   On: 09/24/2022 03:44   CUP PACEART REMOTE DEVICE CHECK  Result Date: 09/14/2022 Scheduled remote reviewed. Normal device function.  3 AMS, 10-16sec in duration, hx of PAF Burden 2.9%, Eliquis per PA report Next remote 91 days. LA, CVRS   Microbiology: Results for orders placed or performed during the hospital encounter of 09/24/22  MRSA Next Gen by PCR, Nasal     Status: None   Collection Time: 09/25/22 12:28 PM   Specimen: Nasal Mucosa; Nasal Swab  Result Value Ref Range Status   MRSA by PCR Next Gen NOT DETECTED NOT DETECTED Final    Comment: (NOTE) The GeneXpert MRSA Assay (FDA approved for NASAL specimens only), is one component of a comprehensive MRSA colonization surveillance program. It is not intended to diagnose MRSA infection nor to guide or monitor treatment for MRSA infections. Test performance is not FDA approved in patients less than 68 years old. Performed at [facility], [address], [place], [place] [address]    Labs: CBC: Recent Labs  Lab 09/24/22 0155 09/25/22 0045 09/26/22 0452 09/27/22 0606  WBC 7.1 10.6* 7.4 6.5  NEUTROABS 5.2  --  5.4 4.2  HGB 11.0* 12.6* 12.7* 11.6*  HCT 36.9* 43.5 43.4 39.4   MCV 90.0 91.8 91.9 90.0  PLT 250 237 236 212   Basic Metabolic Panel: Recent Labs  Lab  09/24/22 0155 09/25/22 0045 09/26/22 0452 09/27/22 0606  NA 132* 140 143 135  K 4.3 4.4 3.6 3.2*  CL 102 107 110 102  CO2 21* 20* 23 23  GLUCOSE 391* 237* 216* 185*  BUN 28* 22 33* 27*  CREATININE 1.83* 1.44* 1.81* 1.75*  CALCIUM 8.5* 8.7* 8.6* 8.3*  MG  --   --  3.0* 2.7*  PHOS  --   --   --  1.8*   Liver Function Tests: Recent Labs  Lab 09/24/22 0155 09/27/22 0606  AST 15 15  ALT 16 12  ALKPHOS 85 74  BILITOT 0.6 0.8  PROT 6.9 6.8  ALBUMIN 3.5 3.3*   CBG: Recent Labs  Lab 09/26/22 1136 09/26/22 1648 09/26/22 2127 09/27/22 0544 09/27/22 0737  GLUCAP 214* 212* 145* 167* 190*   Discharge time spent: greater than 30 minutes.  Signed: Marguerita Merles, DO [facility] 09/29/2022

## 2022-09-28 ENCOUNTER — Other Ambulatory Visit: Payer: Self-pay | Admitting: Family

## 2022-09-28 ENCOUNTER — Telehealth: Payer: Self-pay

## 2022-09-28 DIAGNOSIS — M542 Cervicalgia: Secondary | ICD-10-CM

## 2022-09-28 NOTE — Transitions of Care (Post Inpatient/ED Visit) (Signed)
09/28/2022  Name: Jesse [name] MRN: [id number] DOB: Sep 29, 1950  Today's TOC FU Call Status: Today's TOC FU Call Status:: Successful TOC FU Call Completed TOC FU Call Complete Date: 09/28/22  Transition Care Management Follow-up Telephone Call Date of Discharge: 09/27/22 Discharge Facility: [facility]) Type of Discharge: Inpatient Admission Primary Inpatient Discharge Diagnosis:: "SBO" How have you been since you were released from the hospital?: Better (Pt reports he "rested well last night-having no pain,eating soft foods and tolerating, colostomy working and no blockage") Any questions or concerns?: No  Items Reviewed: Did you receive and understand the discharge instructions provided?: Yes Medications obtained,verified, and reconciled?: Yes (Medications Reviewed) Any new allergies since your discharge?: No Dietary orders reviewed?: Yes Type of Diet Ordered:: low salt/heart healthy/carb modified Do you have support at home?: Yes People in Home: alone Name of Support/Comfort Primary Source: pt resides at [facility]F  Medications Reviewed Today: Medications Reviewed Today     Reviewed by Charlyn Minerva, RN (Registered Nurse) on 09/28/22 at 0957  Med List Status: <None>   Medication Order Taking? Sig Documenting Provider Last Dose Status Informant  Accu-Chek Softclix Lancets lancets [id number] Yes USE TO TEST BLOOD GLUCOSE ONCE DAILY AS DIRECTED Ngetich, [name] C, NP Taking Active Self, Pharmacy Records  acetaminophen (TYLENOL) 325 MG tablet [id number] Yes Take 2 tablets (650 mg total) by mouth every 6 (six) hours as needed for mild pain or moderate pain. Leroy Sea, MD Taking Active Self, Pharmacy Records  alfuzosin (UROXATRAL) 10 MG 24 hr tablet [id number] Yes TAKE 1 TABLET BY MOUTH EVERY DAY Ngetich, [name], NP Taking Active Self, Pharmacy Records  ALPRAZolam Prudy Feeler) 0.5 MG tablet [id number] Yes TAKE 1 TABLET(0.5 MG) BY MOUTH TWICE DAILY AS NEEDED FOR ANXIETY OR  SLEEP Ngetich, [name] C, NP Taking Active Self, Pharmacy Records  apixaban (ELIQUIS) 5 MG TABS tablet [id number] Yes Take 1 tablet (5 mg total) by mouth 2 (two) times daily. Eustace Pen, PA-C Taking Active Self, Pharmacy Records  calcium carbonate (TUMS - DOSED IN MG ELEMENTAL CALCIUM) 500 MG chewable tablet [id number] Yes Chew 2 tablets by mouth every 8 (eight) hours as needed for indigestion or heartburn. [provider] Taking Active Self, Pharmacy Records  cyanocobalamin 1000 MCG tablet [id number] Yes Take 1,000 mcg by mouth daily. [provider] Taking Active Self, Pharmacy Records  diclofenac Sodium (VOLTAREN) 1 % GEL [id number] Yes Apply 2 g topically 4 (four) times daily. Apply to your right neck and shoulder area 3-4 times a day as needed  Patient taking differently: Apply 2 g topically 4 (four) times daily as needed (pain). Apply to your right neck and shoulder area 3-4 times a day as needed   Ngetich, [name], NP Taking Active Self, Pharmacy Records  DULoxetine (CYMBALTA) 60 MG capsule [id number] Yes TAKE 1 CAPSULE(60 MG) BY MOUTH DAILY Ngetich, [name] C, NP Taking Active Self, Pharmacy Records  furosemide (LASIX) 40 MG tablet [id number] Yes Take 80 mg by mouth 2 (two) times daily with breakfast and lunch. [provider] Taking Active Self, Pharmacy Records  glucose blood (ACCU-CHEK GUIDE) test strip [id number] Yes USE TO TEST BLOOD SUGAR ONCE DAILY Ngetich, [name], NP Taking Active Self, Pharmacy Records  Insulin Pen Needle 32G X 4 MM MISC [id number] Yes Use to inject insulin twice daily. Dx: E11.22 Ngetich, [name], NP Taking Active Self, Pharmacy Records  isosorbide mononitrate (IMDUR) 60 MG 24 hr tablet [id number] Yes Take 1 tablet (60 mg total) by mouth daily.  Ngetich, [name], NP Taking Active Self, Pharmacy Records  JARDIANCE 25 MG TABS tablet [id number] Yes TAKE 1 TABLET BY MOUTH EVERY DAY Ngetich, [name] C, NP Taking Active Self, Pharmacy Records  ketoconazole  (NIZORAL) 2 % cream [id number] Yes Apply 1 application topically as needed for irritation. Ngetich, [name], NP Taking Active Self, Pharmacy Records  Lancet Device [place] [id number] Yes 1 Device by Does not apply route daily. Ngetich, [name], NP Taking Active Self, Pharmacy Records  LANTUS SOLOSTAR 100 UNIT/ML Solostar Pen [id number] Yes ADMINISTER 50 UNITS UNDER THE SKIN AT BEDTIME Ngetich, [name] C, NP Taking Active Self, Pharmacy Records  methocarbamol (ROBAXIN) 500 MG tablet [id number] Yes Take 1 tablet (500 mg total) by mouth every 8 (eight) hours as needed for muscle spasms. Ngetich, [name], NP Taking Active Self, Pharmacy Records  ondansetron (ZOFRAN) 4 MG tablet [id number] Yes Take 1 tablet (4 mg total) by mouth every 8 (eight) hours as needed for nausea or vomiting. Ngetich, [name], NP Taking Active Self, Pharmacy Records  OXYGEN [id number] Yes Inhale 2 L into the lungs continuous. [provider] Taking Active Self, Pharmacy Records  pantoprazole (PROTONIX) 40 MG tablet [id number] Yes TAKE 1 TABLET(40 MG) BY MOUTH TWICE DAILY BEFORE A MEAL Ngetich, [name] C, NP Taking Active Self, Pharmacy Records  potassium chloride SA (KLOR-CON M) 20 MEQ tablet [id number] Yes Take 1 tablet (20 mEq total) by mouth 2 (two) times daily. Leroy Sea, MD Taking Active Self, Pharmacy Records  Red Yeast Rice 600 MG CAPS [id number] Yes Take 1,200 mg by mouth daily. [provider] Taking Active Self, Pharmacy Records  rOPINIRole (REQUIP) 2 MG tablet [id number] Yes TAKE 1 TABLET(2 MG) BY MOUTH AT BEDTIME Ngetich, [name] C, NP Taking Active Self, Pharmacy Records  rosuvastatin (CRESTOR) 10 MG tablet [id number] Yes TAKE 1 TABLET BY MOUTH EVERY DAY  Patient taking differently: Take 10 mg by mouth daily with supper.   Ngetich, [name], NP Taking Active Self, Pharmacy Records  simethicone (MYLICON) 80 MG chewable tablet [id number] Yes Chew 80 mg by mouth every 6 (six) hours as needed for flatulence. [provider] Taking Active Self, Pharmacy Records  tiZANidine (ZANAFLEX) 2 MG tablet [id number] Yes TAKE 1 TABLET BY MOUTH TWICE DAILY  Patient taking differently: Take 2 mg by mouth at bedtime as needed for muscle spasms.   Ngetich, [name], NP Taking Active Self, Pharmacy Records  topiramate (TOPAMAX) 25 MG tablet [id number] Yes TAKE 1 TABLET BY MOUTH EVERY DAY Ngetich, [name] C, NP Taking Active Self, Pharmacy Records  traMADol (ULTRAM) 50 MG tablet [id number] Yes TAKE 1 TABLET(50 MG) BY MOUTH TWICE DAILY AS NEEDED Ngetich, [name], NP Taking Active Self, Pharmacy Records  traZODone (DESYREL) 50 MG tablet [id number] Yes TAKE 2 TABLETS(100 MG) BY MOUTH AT BEDTIME Ngetich, [name], NP Taking Active Self, Pharmacy Records  triamcinolone cream (KENALOG) 0.1 % [id number] Yes Apply 1 application. topically 2 (two) times daily. Apply to affected areas on shin area  Patient taking differently: Apply 1 application  topically 2 (two) times daily as needed (rash).   Ngetich, [name], NP Taking Active Self, Pharmacy Records  VICTOZA 18 MG/3ML [place] [id number] Yes ADMINISTER 1.8 MG UNDER THE SKIN EVERY DAY Ngetich, [name], NP Taking Active Self, Pharmacy Records  Vitamin D, Ergocalciferol, (DRISDOL) 1.25 MG (50000 UNIT) CAPS capsule [id number] Yes TAKE 1 CAPSULE BY MOUTH EVERY 7 DAYS Ngetich, [name], NP Taking Active Self, Pharmacy Records  Med List Note (Horton,  Stanford Breed, CPhT 09/07/21 0908): Cpap 12.5 pressure            Home Care and Equipment/Supplies: Were Home Health Services Ordered?: Yes Name of Home Health Agency:: [facility] Has Agency set up a time to come to your home?: No (Pt states he already called his [facility] and she will be calling back with visit info) Any new equipment or medical supplies ordered?: NA  Functional Questionnaire: Do you need assistance with bathing/showering or dressing?: No Do you need assistance with meal preparation?: No Do you need assistance with eating?: No Do you  have difficulty maintaining continence: No Do you need assistance with getting out of bed/getting out of a chair/moving?: No Do you have difficulty managing or taking your medications?: No  Follow up appointments reviewed: PCP Follow-up appointment confirmed?: Yes Date of PCP follow-up appointment?: 10/09/22 Follow-up Provider: [facility] Follow-up appointment confirmed?: Yes Date of Specialist follow-up appointment?: 10/01/22 Follow-Up Specialty Provider:: Dr. Lonzo Cloud Do you need transportation to your follow-up appointment?: No Do you understand care options if your condition(s) worsen?: Yes-patient verbalized understanding  SDOH Interventions Today    Flowsheet Row Most Recent Value  SDOH Interventions   Food Insecurity Interventions Intervention Not Indicated  Transportation Interventions Intervention Not Indicated       TOC Interventions Today    Flowsheet Row Most Recent Value  TOC Interventions   TOC Interventions Discussed/Reviewed TOC Interventions Discussed, Arranged PCP follow up less than 12 days/Care Guide scheduled       TOC Interventions Today    Flowsheet Row Most Recent Value  TOC Interventions   TOC Interventions Discussed/Reviewed TOC Interventions Discussed, Arranged PCP follow up less than 12 days/Care Guide scheduled      Interventions Today    Flowsheet Row Most Recent Value  Chronic Disease   Chronic disease during today's visit Diabetes  [A1C>10, pt has DM specialist appt-hoping to get a continuous glucose monitoring device]  General Interventions   General Interventions Discussed/Reviewed General Interventions Discussed, Doctor Visits, Durable Medical Equipment (DME)  Doctor Visits Discussed/Reviewed Doctor Visits Discussed, PCP, Specialist  Durable Medical Equipment (DME) Glucomoter, Oxygen  [pt has cbg meter in home-hasn't checked blood sugar yet]  PCP/Specialist Visits Compliance with follow-up visit   Education Interventions   Education Provided Provided Education  Provided Verbal Education On Nutrition, When to see the doctor, Medication, Blood Sugar Monitoring  Nutrition Interventions   Nutrition Discussed/Reviewed Nutrition Discussed, Decreasing sugar intake, Adding fruits and vegetables, Increasing proteins, Decreasing fats, Decreasing salt  Pharmacy Interventions   Pharmacy Dicussed/Reviewed Pharmacy Topics Discussed, Medications and their functions  Safety Interventions   Safety Discussed/Reviewed Safety Discussed        Alessandra Grout [facility] Care Management Care Management Community Coordinator Direct Phone: [phone] Toll Free: [phone] Fax: [phone]

## 2022-09-28 NOTE — Telephone Encounter (Signed)
Patient medication has High Risk Warnings. Medication pend and sent to PCP Ngetich, [name], NP for approval.

## 2022-09-28 NOTE — Progress Notes (Unsigned)
Name: Jesse [name]  MRN/ DOB: [id number], 02/14/1951   Age/ Sex: 72 y.o., male    PCP: Ngetich, [name], NP   Reason for Endocrinology Evaluation: Type {NUMBERS 1 OR 2:522190} Diabetes Mellitus     Date of Initial Endocrinology Visit: 09/28/2022     PATIENT IDENTIFIER: Jesse [name] is a 72 y.o. male with a past medical history of ***. The patient presented for initial endocrinology clinic visit on 09/28/2022 for consultative assistance with his diabetes management.    HPI: Jesse [name] was    Diagnosed with DM *** Prior Medications tried/Intolerance: *** Currently checking blood sugars *** x / day,  before breakfast and ***.  Hypoglycemia episodes : ***               Symptoms: ***                 Frequency: ***/  Hemoglobin A1c has ranged from 5.7% in 2023, peaking at 10.1% in 2024.  In terms of diet, the patient ***   HOME DIABETES REGIMEN: Basal: ***  Bolus: ***   Statin: {Yes/No:11203} ACE-I/ARB: {YES/NO:17245} Prior Diabetic Education: {Yes/No:11203}   METER DOWNLOAD SUMMARY: Date range evaluated: *** Fingerstick Blood Glucose Tests = *** Average Number Tests/Day = *** Overall Mean FS Glucose = *** Standard Deviation = ***  BG Ranges: Low = *** High = ***   Hypoglycemic Events/30 Days: BG < 50 = *** Episodes of symptomatic severe hypoglycemia = ***   DIABETIC COMPLICATIONS: Microvascular complications:  *** Denies: *** Last eye exam: Completed   Macrovascular complications:  *** Denies: CAD, PVD, CVA   PAST HISTORY: Past Medical History:  Past Medical History:  Diagnosis Date   Alcohol use disorder in remission 1999   Anxiety and depression    At high risk for falls    Former cigarette smoker 1999   Generalized anxiety disorder    Hard of hearing    Hyperlipidemia due to type 2 diabetes mellitus (HCC)    Hypertension    Morbid obesity (HCC)    Neuropathy    O2 dependent    Risk for falls    Sleep apnea    Type 2 diabetes  mellitus (HCC)    Ventral hernia with bowel obstruction    Per Hospital Encounter on 05/21/22   Past Surgical History:  Past Surgical History:  Procedure Laterality Date   APPENDECTOMY  1980   IR THORACENTESIS ASP PLEURAL SPACE W/IMG GUIDE  05/18/2021   IR THORACENTESIS ASP PLEURAL SPACE W/IMG GUIDE  10/06/2021   KNEE SURGERY Right    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 His smoking use included cigarettes. He has never used smokeless tobacco. He reports that he does not currently use alcohol. He reports that he does not use drugs. Family History:  Family History  Problem Relation Age of Onset   Diabetes Brother    Bipolar disorder Daughter    Anxiety disorder Son      HOME MEDICATIONS: Allergies as of 10/01/2022       Reactions   Tape Rash   If left on for a long time        Medication List        Accurate as of September 28, 2022 11:51 AM. If you have  any questions, ask your nurse or doctor.          Accu-Chek Guide test strip Generic drug: glucose blood USE TO TEST BLOOD SUGAR ONCE DAILY   Accu-Chek Softclix Lancets lancets USE TO TEST BLOOD GLUCOSE ONCE DAILY AS DIRECTED   acetaminophen 325 MG tablet Commonly known as: TYLENOL Take 2 tablets (650 mg total) by mouth every 6 (six) hours as needed for mild pain or moderate pain.   alfuzosin 10 MG 24 hr tablet Commonly known as: UROXATRAL TAKE 1 TABLET BY MOUTH EVERY DAY   ALPRAZolam 0.5 MG tablet Commonly known as: XANAX TAKE 1 TABLET(0.5 MG) BY MOUTH TWICE DAILY AS NEEDED FOR ANXIETY OR SLEEP   apixaban 5 MG Tabs tablet Commonly known as: Eliquis Take 1 tablet (5 mg total) by mouth 2 (two) times daily.   calcium carbonate 500 MG chewable tablet Commonly known as: TUMS - dosed in mg elemental calcium Chew 2  tablets by mouth every 8 (eight) hours as needed for indigestion or heartburn.   cyanocobalamin 1000 MCG tablet Take 1,000 mcg by mouth daily.   diclofenac Sodium 1 % Gel Commonly known as: VOLTAREN Apply 2 g topically 4 (four) times daily. Apply to your right neck and shoulder area 3-4 times a day as needed What changed:  when to take this reasons to take this   DULoxetine 60 MG capsule Commonly known as: CYMBALTA TAKE 1 CAPSULE(60 MG) BY MOUTH DAILY   furosemide 40 MG tablet Commonly known as: LASIX Take 80 mg by mouth 2 (two) times daily with breakfast and lunch.   Insulin Pen Needle 32G X 4 MM Misc Use to inject insulin twice daily. Dx: E11.22   isosorbide mononitrate 60 MG 24 hr tablet Commonly known as: IMDUR Take 1 tablet (60 mg total) by mouth daily.   Jardiance 25 MG Tabs tablet Generic drug: empagliflozin TAKE 1 TABLET BY MOUTH EVERY DAY   ketoconazole 2 % cream Commonly known as: NIZORAL Apply 1 application topically as needed for irritation.   Lancet Device Misc 1 Device by Does not apply route daily.   Lantus SoloStar 100 UNIT/ML Solostar Pen Generic drug: insulin glargine ADMINISTER 50 UNITS UNDER THE SKIN AT BEDTIME   methocarbamol 500 MG tablet Commonly known as: ROBAXIN Take 1 tablet (500 mg total) by mouth every 8 (eight) hours as needed for muscle spasms.   ondansetron 4 MG tablet Commonly known as: Zofran Take 1 tablet (4 mg total) by mouth every 8 (eight) hours as needed for nausea or vomiting.   OXYGEN Inhale 2 L into the lungs continuous.   pantoprazole 40 MG tablet Commonly known as: PROTONIX TAKE 1 TABLET(40 MG) BY MOUTH TWICE DAILY BEFORE A MEAL   potassium chloride SA 20 MEQ tablet Commonly known as: KLOR-CON M Take 1 tablet (20 mEq total) by mouth 2 (two) times daily.   Red Yeast Rice 600 MG Caps Take 1,200 mg by mouth daily.   rOPINIRole 2 MG tablet Commonly known as: REQUIP TAKE 1 TABLET(2 MG) BY MOUTH AT BEDTIME    rosuvastatin 10 MG tablet Commonly known as: CRESTOR TAKE 1 TABLET BY MOUTH EVERY DAY What changed: when to take this   simethicone 80 MG chewable tablet Commonly known as: MYLICON Chew 80 mg by mouth every 6 (six) hours as needed for flatulence.   tiZANidine 2 MG tablet Commonly known as: ZANAFLEX TAKE 1 TABLET BY MOUTH TWICE DAILY What changed:  when to take this reasons to take this  topiramate 25 MG tablet Commonly known as: TOPAMAX TAKE 1 TABLET BY MOUTH EVERY DAY   traMADol 50 MG tablet Commonly known as: ULTRAM TAKE 1 TABLET(50 MG) BY MOUTH TWICE DAILY AS NEEDED   traZODone 50 MG tablet Commonly known as: DESYREL TAKE 2 TABLETS(100 MG) BY MOUTH AT BEDTIME   triamcinolone cream 0.1 % Commonly known as: KENALOG Apply 1 application. topically 2 (two) times daily. Apply to affected areas on shin area What changed:  when to take this reasons to take this additional instructions   Victoza 18 MG/3ML [place]opn Generic drug: liraglutide ADMINISTER 1.8 MG UNDER THE SKIN EVERY DAY   Vitamin D (Ergocalciferol) 1.25 MG (50000 UNIT) Caps capsule Commonly known as: DRISDOL TAKE 1 CAPSULE BY MOUTH EVERY 7 DAYS         ALLERGIES: Allergies  Allergen Reactions   Tape Rash    If left on for a long time     REVIEW OF SYSTEMS: A comprehensive ROS was conducted with the patient and is negative    OBJECTIVE:   VITAL SIGNS: There were no vitals taken for this visit.   PHYSICAL EXAM:  General: Pt appears well and is in NAD  Neck: General: Supple without adenopathy or carotid bruits. Thyroid: Thyroid size normal.  No goiter or nodules appreciated.   Lungs: Clear with good BS bilat   Heart: RRR   Abdomen:  soft, nontender  Extremities:  Lower extremities - No pretibial edema.   Skin:  No rash noted.  Neuro: MS is good with appropriate affect, pt is alert and Ox3    DM foot exam:    DATA REVIEWED:  Lab Results  Component Value Date   HGBA1C 10.1 (H)  09/24/2022   HGBA1C 8.0 12/20/2021   HGBA1C 6.8 (H) 11/02/2021    Latest Reference Range & Units 09/27/22 06:06  Sodium 135 - 145 mmol/L 135  Potassium 3.5 - 5.1 mmol/L 3.2 (L)  Chloride 98 - 111 mmol/L 102  CO2 22 - 32 mmol/L 23  Glucose 70 - 99 mg/dL 960 (H)  BUN 8 - 23 mg/dL 27 (H)  Creatinine 4.54 - 1.24 mg/dL 0.98 (H)  Calcium 8.9 - 10.3 mg/dL 8.3 (L)  Anion gap 5 - 15  10  Phosphorus 2.5 - 4.6 mg/dL 1.8 (L)  Magnesium 1.7 - 2.4 mg/dL 2.7 (H)  Alkaline Phosphatase 38 - 126 U/L 74  Albumin 3.5 - 5.0 g/dL 3.3 (L)  AST 15 - 41 U/L 15  ALT 0 - 44 U/L 12  Total Protein 6.5 - 8.1 g/dL 6.8  Total Bilirubin 0.3 - 1.2 mg/dL 0.8  GFR, Estimated >11 mL/min 41 (L)    ASSESSMENT / PLAN / RECOMMENDATIONS:   1) Type *** Diabetes Mellitus, ***controlled, With*** complications - Most recent A1c of *** %. Goal A1c < *** %.  ***  Plan: GENERAL: ***  MEDICATIONS: ***  EDUCATION / INSTRUCTIONS: BG monitoring instructions: Patient is instructed to check his blood sugars *** times a day, ***. Call [facility] if: BG persistently < 70  I reviewed the Rule of 15 for the treatment of hypoglycemia in detail with the patient. Literature supplied.   2) Diabetic complications:  Eye: Does *** have known diabetic retinopathy.  Neuro/ Feet: Does *** have known diabetic peripheral neuropathy. Renal: Patient does *** have known baseline CKD. He is *** on an ACEI/ARB at present.  3) Lipids: Patient is *** on a statin.    4) Hypertension: ***  at goal of <  140/90 mmHg.       Signed electronically by: Lyndle Herrlich, MD  [facility] Endocrinology  [facility] [address] [place] [place], [place] [address] Phone: [phone] FAX: [phone]   CC: Caesar Bookman, NP [address] [place] [address] Phone: [phone]  Fax: [phone]    Return to Endocrinology clinic as below: Future Appointments  Date Time Provider Department  Center  10/01/2022 10:30 AM , Konrad Dolores, MD [facility] None  10/09/2022 10:40 AM Ngetich, [name], NP [facility] None  10/10/2022  2:30 PM Little, Karma Lew, RN [facility] None  12/14/2022  7:35 AM [facility] DEVICE REMOTES [facility]  12/26/2022 11:30 AM Eustace Pen, PA-C [facility] None  03/15/2023  7:35 AM [facility] DEVICE REMOTES [facility]  06/14/2023  7:35 AM [facility] DEVICE REMOTES [facility]  07/02/2023 11:00 AM Ngetich, [name] C, NP [facility] None  09/13/2023  7:35 AM [facility] DEVICE REMOTES [facility]  12/13/2023  7:35 AM [facility] DEVICE REMOTES [facility]  03/13/2024  7:35 AM [facility] DEVICE REMOTES [facility]

## 2022-09-30 DIAGNOSIS — K436 Other and unspecified ventral hernia with obstruction, without gangrene: Secondary | ICD-10-CM | POA: Diagnosis not present

## 2022-09-30 DIAGNOSIS — E1122 Type 2 diabetes mellitus with diabetic chronic kidney disease: Secondary | ICD-10-CM | POA: Diagnosis not present

## 2022-09-30 DIAGNOSIS — E1140 Type 2 diabetes mellitus with diabetic neuropathy, unspecified: Secondary | ICD-10-CM | POA: Diagnosis not present

## 2022-09-30 DIAGNOSIS — E785 Hyperlipidemia, unspecified: Secondary | ICD-10-CM | POA: Diagnosis not present

## 2022-09-30 DIAGNOSIS — N184 Chronic kidney disease, stage 4 (severe): Secondary | ICD-10-CM | POA: Diagnosis not present

## 2022-09-30 DIAGNOSIS — K56699 Other intestinal obstruction unspecified as to partial versus complete obstruction: Secondary | ICD-10-CM | POA: Diagnosis not present

## 2022-09-30 DIAGNOSIS — E1169 Type 2 diabetes mellitus with other specified complication: Secondary | ICD-10-CM | POA: Diagnosis not present

## 2022-09-30 DIAGNOSIS — I5032 Chronic diastolic (congestive) heart failure: Secondary | ICD-10-CM | POA: Diagnosis not present

## 2022-09-30 DIAGNOSIS — I130 Hypertensive heart and chronic kidney disease with heart failure and stage 1 through stage 4 chronic kidney disease, or unspecified chronic kidney disease: Secondary | ICD-10-CM | POA: Diagnosis not present

## 2022-10-01 ENCOUNTER — Telehealth: Payer: Self-pay | Admitting: *Deleted

## 2022-10-01 ENCOUNTER — Encounter: Payer: Self-pay | Admitting: Internal Medicine

## 2022-10-01 ENCOUNTER — Other Ambulatory Visit: Payer: Self-pay | Admitting: Family

## 2022-10-01 ENCOUNTER — Ambulatory Visit: Payer: Medicare PPO | Admitting: Internal Medicine

## 2022-10-01 VITALS — BP 122/70 | HR 60 | Ht 72.0 in | Wt 362.0 lb

## 2022-10-01 DIAGNOSIS — N1832 Chronic kidney disease, stage 3b: Secondary | ICD-10-CM | POA: Diagnosis not present

## 2022-10-01 DIAGNOSIS — Z794 Long term (current) use of insulin: Secondary | ICD-10-CM | POA: Diagnosis not present

## 2022-10-01 DIAGNOSIS — R29898 Other symptoms and signs involving the musculoskeletal system: Secondary | ICD-10-CM

## 2022-10-01 DIAGNOSIS — E1142 Type 2 diabetes mellitus with diabetic polyneuropathy: Secondary | ICD-10-CM

## 2022-10-01 DIAGNOSIS — E1122 Type 2 diabetes mellitus with diabetic chronic kidney disease: Secondary | ICD-10-CM | POA: Diagnosis not present

## 2022-10-01 DIAGNOSIS — E1165 Type 2 diabetes mellitus with hyperglycemia: Secondary | ICD-10-CM

## 2022-10-01 DIAGNOSIS — F411 Generalized anxiety disorder: Secondary | ICD-10-CM

## 2022-10-01 DIAGNOSIS — E113299 Type 2 diabetes mellitus with mild nonproliferative diabetic retinopathy without macular edema, unspecified eye: Secondary | ICD-10-CM | POA: Diagnosis not present

## 2022-10-01 MED ORDER — LANTUS SOLOSTAR 100 UNIT/ML ~~LOC~~ SOPN: 46.0000 [IU] | PEN_INJECTOR | Freq: Every day | SUBCUTANEOUS | 4 refills | Status: DC

## 2022-10-01 MED ORDER — GLIPIZIDE 5 MG PO TABS: 5.0000 mg | ORAL_TABLET | Freq: Every day | ORAL | 3 refills | Status: DC

## 2022-10-01 MED ORDER — DEXCOM G7 SENSOR MISC: 1.0000 | 3 refills | Status: DC

## 2022-10-01 MED ORDER — OMNIPOD 5 G7 PODS (GEN 5) MISC: 1.0000 | 3 refills | Status: DC

## 2022-10-01 MED ORDER — LIRAGLUTIDE 18 MG/3ML ~~LOC~~ SOPN: 1.8000 mg | PEN_INJECTOR | Freq: Every day | SUBCUTANEOUS | 3 refills | Status: DC

## 2022-10-01 MED ORDER — EMPAGLIFLOZIN 25 MG PO TABS: 25.0000 mg | ORAL_TABLET | Freq: Every day | ORAL | 3 refills | Status: DC

## 2022-10-01 MED ORDER — INSULIN PEN NEEDLE 31G X 5 MM MISC: 1.0000 | Freq: Every day | 3 refills | Status: DC

## 2022-10-01 MED ORDER — OMNIPOD 5 G7 INTRO (GEN 5) KIT: 1.0000 | PACK | 0 refills | Status: DC

## 2022-10-01 NOTE — Telephone Encounter (Signed)
Tonya with [facility] Notified.

## 2022-10-01 NOTE — Telephone Encounter (Signed)
Okay to give verbal orders for [facility]

## 2022-10-01 NOTE — Patient Instructions (Addendum)
Start Glipizide 5 mg, 1 tablet before the first meal of the day  Continue Jardiance 25 mg daily Continue Victoza 1.8 mg daily Decrease Lantus 46 units daily     HOW TO TREAT LOW BLOOD SUGARS (Blood sugar LESS THAN 70 MG/DL) Please follow the RULE OF 15 for the treatment of hypoglycemia treatment (when your (blood sugars are less than 70 mg/dL)   STEP 1: Take 15 grams of carbohydrates when your blood sugar is low, which includes:  3-4 GLUCOSE TABS  OR 3-4 OZ OF JUICE OR REGULAR SODA OR ONE TUBE OF GLUCOSE GEL    STEP 2: RECHECK blood sugar in 15 MINUTES STEP 3: If your blood sugar is still low at the 15 minute recheck --> then, go back to STEP 1 and treat AGAIN with another 15 grams of carbohydrates.

## 2022-10-01 NOTE — Telephone Encounter (Signed)
Jesse [name] with [facility] called requesting verbal orders for Home Health Nursing. Stated that the order was placed at [facility] Discharge.   Patient has an upcoming appointment for Hospital Follow up 10/09/2022  Requesting Verbal order for Home Health Nursing 2X1week and 1X3weeks for Diabetes Management.    Please Advise.

## 2022-10-01 NOTE — Telephone Encounter (Signed)
Patient was given 180 tablets for last refill 06/11/2022. Please advise.

## 2022-10-02 ENCOUNTER — Telehealth: Payer: Self-pay

## 2022-10-02 MED ORDER — OMNIPOD 5 G7 INTRO (GEN 5) KIT: 1.0000 | PACK | 0 refills | Status: DC

## 2022-10-02 MED ORDER — OMNIPOD 5 G7 PODS (GEN 5) MISC: 1.0000 | 3 refills | Status: DC

## 2022-10-02 MED ORDER — DEXCOM G7 SENSOR MISC: 1.0000 | 3 refills | Status: DC

## 2022-10-02 NOTE — Telephone Encounter (Signed)
Prescription sent to [facility] per patient request.

## 2022-10-03 ENCOUNTER — Telehealth: Payer: Self-pay

## 2022-10-03 ENCOUNTER — Other Ambulatory Visit: Payer: Self-pay | Admitting: Family

## 2022-10-03 DIAGNOSIS — R110 Nausea: Secondary | ICD-10-CM

## 2022-10-03 DIAGNOSIS — E785 Hyperlipidemia, unspecified: Secondary | ICD-10-CM | POA: Diagnosis not present

## 2022-10-03 DIAGNOSIS — N184 Chronic kidney disease, stage 4 (severe): Secondary | ICD-10-CM | POA: Diagnosis not present

## 2022-10-03 DIAGNOSIS — K436 Other and unspecified ventral hernia with obstruction, without gangrene: Secondary | ICD-10-CM | POA: Diagnosis not present

## 2022-10-03 DIAGNOSIS — E1165 Type 2 diabetes mellitus with hyperglycemia: Secondary | ICD-10-CM

## 2022-10-03 DIAGNOSIS — E1140 Type 2 diabetes mellitus with diabetic neuropathy, unspecified: Secondary | ICD-10-CM | POA: Diagnosis not present

## 2022-10-03 DIAGNOSIS — E1169 Type 2 diabetes mellitus with other specified complication: Secondary | ICD-10-CM | POA: Diagnosis not present

## 2022-10-03 DIAGNOSIS — E1122 Type 2 diabetes mellitus with diabetic chronic kidney disease: Secondary | ICD-10-CM | POA: Diagnosis not present

## 2022-10-03 DIAGNOSIS — I130 Hypertensive heart and chronic kidney disease with heart failure and stage 1 through stage 4 chronic kidney disease, or unspecified chronic kidney disease: Secondary | ICD-10-CM | POA: Diagnosis not present

## 2022-10-03 DIAGNOSIS — K56699 Other intestinal obstruction unspecified as to partial versus complete obstruction: Secondary | ICD-10-CM | POA: Diagnosis not present

## 2022-10-03 DIAGNOSIS — I5032 Chronic diastolic (congestive) heart failure: Secondary | ICD-10-CM | POA: Diagnosis not present

## 2022-10-03 MED ORDER — INSULIN ASPART 100 UNIT/ML IJ SOLN: INTRAMUSCULAR | 3 refills | Status: DC

## 2022-10-03 NOTE — Telephone Encounter (Signed)
Patient has request refill on medication. Medication pend and sent to PCP Ngetich, [name], NP

## 2022-10-03 NOTE — Telephone Encounter (Signed)
Patient has pump supplies and want to get in asap.

## 2022-10-03 NOTE — Addendum Note (Signed)
Addended by: Scarlette Shorts on: 10/03/2022 12:00 PM   Modules accepted: Orders

## 2022-10-04 ENCOUNTER — Other Ambulatory Visit: Payer: Self-pay

## 2022-10-04 MED ORDER — OMNIPOD 5 G7 INTRO (GEN 5) KIT: 1.0000 | PACK | 0 refills | Status: DC

## 2022-10-04 MED ORDER — OMNIPOD 5 G7 PODS (GEN 5) MISC: 1.0000 | 3 refills | Status: DC

## 2022-10-08 ENCOUNTER — Ambulatory Visit: Payer: Medicare PPO | Admitting: Nutrition

## 2022-10-09 ENCOUNTER — Encounter: Payer: Self-pay | Admitting: Family

## 2022-10-09 ENCOUNTER — Ambulatory Visit: Payer: Medicare PPO | Admitting: Family

## 2022-10-09 ENCOUNTER — Telehealth: Payer: Self-pay | Admitting: Nutrition

## 2022-10-09 VITALS — BP 129/88 | HR 70 | Temp 97.9°F | Resp 18 | Ht 72.0 in | Wt 364.0 lb

## 2022-10-09 DIAGNOSIS — E785 Hyperlipidemia, unspecified: Secondary | ICD-10-CM | POA: Diagnosis not present

## 2022-10-09 DIAGNOSIS — K56699 Other intestinal obstruction unspecified as to partial versus complete obstruction: Secondary | ICD-10-CM | POA: Diagnosis not present

## 2022-10-09 DIAGNOSIS — N184 Chronic kidney disease, stage 4 (severe): Secondary | ICD-10-CM | POA: Diagnosis not present

## 2022-10-09 DIAGNOSIS — K436 Other and unspecified ventral hernia with obstruction, without gangrene: Secondary | ICD-10-CM | POA: Diagnosis not present

## 2022-10-09 DIAGNOSIS — I129 Hypertensive chronic kidney disease with stage 1 through stage 4 chronic kidney disease, or unspecified chronic kidney disease: Secondary | ICD-10-CM | POA: Diagnosis not present

## 2022-10-09 DIAGNOSIS — E1122 Type 2 diabetes mellitus with diabetic chronic kidney disease: Secondary | ICD-10-CM | POA: Diagnosis not present

## 2022-10-09 DIAGNOSIS — I5032 Chronic diastolic (congestive) heart failure: Secondary | ICD-10-CM | POA: Diagnosis not present

## 2022-10-09 DIAGNOSIS — N183 Chronic kidney disease, stage 3 unspecified: Secondary | ICD-10-CM | POA: Diagnosis not present

## 2022-10-09 DIAGNOSIS — I130 Hypertensive heart and chronic kidney disease with heart failure and stage 1 through stage 4 chronic kidney disease, or unspecified chronic kidney disease: Secondary | ICD-10-CM | POA: Diagnosis not present

## 2022-10-09 DIAGNOSIS — K219 Gastro-esophageal reflux disease without esophagitis: Secondary | ICD-10-CM

## 2022-10-09 DIAGNOSIS — E1140 Type 2 diabetes mellitus with diabetic neuropathy, unspecified: Secondary | ICD-10-CM | POA: Diagnosis not present

## 2022-10-09 DIAGNOSIS — E1169 Type 2 diabetes mellitus with other specified complication: Secondary | ICD-10-CM | POA: Diagnosis not present

## 2022-10-09 NOTE — Telephone Encounter (Signed)
Patient has not received his pump as yet.  Wants to start this ASAP.  I told him that I will be out for the next 2 weeks and he was very upset.  Says has watched all of the videos and can do this himself.  Just needs the data to put into the pdm.  I told him that I would message his MD to ask for permission for him to do this on his own.  Explained that he will have to see me when I return a

## 2022-10-09 NOTE — Telephone Encounter (Signed)
see telephone encounter Pt. did not have pump to start yesterday.

## 2022-10-09 NOTE — Progress Notes (Signed)
Provider: Richarda Blade FNP-C   Jesse [name], [name], NP  Patient Care Team: Nolton Denis, [name], NP as PCP - General (Family Medicine) Maisie Fus, MD as PCP - Cardiology (Cardiology) Duke Salvia, MD as PCP - Electrophysiology (Cardiology)  Extended Emergency Contact Information Primary Emergency Contact: [name],[name] Mobile Phone: [phone] Relation: Daughter Secondary Emergency Contact: Donlon,[name] Mobile Phone: [phone] Relation: Daughter Preferred language: English Interpreter needed? No  Code Status:  Full Code  Goals of care: Advanced Directive information    09/24/2022    2:00 PM  Advanced Directives  Does Patient Have a Medical Advance Directive? Yes  Type of Estate agent of [place];Living will  Does patient want to make changes to medical advance directive? No - Patient declined  Copy of Healthcare Power of Attorney in Chart? No - copy requested     Chief Complaint  Patient presents with   Hospitalization Follow-up     hospital f/u    HPI:  Pt is a 72 y.o. male seen today for Transition of care post hospitalization from 09/24/2022 - 09/29/2022  for small bowel obstruction after presenting to the hospital with complains of Left upper abdominal pain.He denied any nausea or vomiting. CT Abdomen/pelvis showed multiple dilated loops of small bowel in the abdomen.Also left renal mass noted which had increased from prior size.General surgery was consulted.He underwent NG tube placement later removed and diet advanced.He tolerated solid foods  States ostomy bag has been patent and no nausea or vomiting.   Type 2 DM - follows up with Endocrinologist.He was started on gl CBG runs in the 90's -120's  He was seen by ophthalmology 3 months ago  He is up to date with foot care.    Past Medical History:  Diagnosis Date   Alcohol use disorder in remission 1999   Anxiety and depression    At high risk for falls    Former cigarette smoker 1999    Generalized anxiety disorder    Hard of hearing    Hyperlipidemia due to type 2 diabetes mellitus (HCC)    Hypertension    Morbid obesity (HCC)    Neuropathy    O2 dependent    Risk for falls    Sleep apnea    Type 2 diabetes mellitus (HCC)    Ventral hernia with bowel obstruction    Per Hospital Encounter on 05/21/22   Past Surgical History:  Procedure Laterality Date   APPENDECTOMY  1980   IR THORACENTESIS ASP PLEURAL SPACE W/IMG GUIDE  05/18/2021   IR THORACENTESIS ASP PLEURAL SPACE W/IMG GUIDE  10/06/2021   KNEE SURGERY Right    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   Procedure: PACEMAKER IMPLANT;  Surgeon: Duke Salvia, MD;  Location: [facility] INVASIVE CV LAB;  Service: Cardiovascular;  Laterality: N/A;    Allergies  Allergen Reactions   Tape Rash    If left on for a long time    Allergies as of 10/09/2022       Reactions   Tape Rash   If left on for a long time        Medication List        Accurate as of October 09, 2022 11:40 AM. If you have any questions, ask your nurse or doctor.          Accu-Chek Guide test strip Generic drug: glucose blood USE TO TEST BLOOD SUGAR ONCE DAILY   Accu-Chek Softclix Lancets lancets USE TO TEST BLOOD GLUCOSE ONCE DAILY  AS DIRECTED   acetaminophen 325 MG tablet Commonly known as: TYLENOL Take 2 tablets (650 mg total) by mouth every 6 (six) hours as needed for mild pain or moderate pain.   alfuzosin 10 MG 24 hr tablet Commonly known as: UROXATRAL TAKE 1 TABLET BY MOUTH EVERY DAY   ALPRAZolam 0.5 MG tablet Commonly known as: XANAX TAKE 1 TABLET(0.5 MG) BY MOUTH TWICE DAILY AS NEEDED FOR ANXIETY OR SLEEP   apixaban 5 MG Tabs tablet Commonly known as: Eliquis Take 1 tablet (5 mg total) by mouth 2 (two) times daily.   calcium carbonate 500 MG chewable tablet Commonly known as: TUMS - dosed in mg elemental calcium Chew 2 tablets by mouth every 8 (eight) hours as needed for indigestion or heartburn.   cyanocobalamin 1000  MCG tablet Take 1,000 mcg by mouth daily.   Dexcom G7 Sensor Misc 1 Device by Does not apply route as directed.   diclofenac Sodium 1 % Gel Commonly known as: VOLTAREN Apply 2 g topically 4 (four) times daily. Apply to your right neck and shoulder area 3-4 times a day as needed What changed:  when to take this reasons to take this   DULoxetine 60 MG capsule Commonly known as: CYMBALTA TAKE 1 CAPSULE(60 MG) BY MOUTH DAILY   empagliflozin 25 MG Tabs tablet Commonly known as: Jardiance Take 1 tablet (25 mg total) by mouth daily.   furosemide 80 MG tablet Commonly known as: LASIX Take 80 mg by mouth 2 (two) times daily.   glipiZIDE 5 MG tablet Commonly known as: GLUCOTROL Take 1 tablet (5 mg total) by mouth daily before breakfast.   insulin aspart 100 UNIT/ML injection Commonly known as: novoLOG Max daily 60 units   Insulin Pen Needle 31G X 5 MM Misc 1 Device by Does not apply route daily in the afternoon.   isosorbide mononitrate 60 MG 24 hr tablet Commonly known as: IMDUR Take 1 tablet (60 mg total) by mouth daily.   ketoconazole 2 % cream Commonly known as: NIZORAL Apply 1 application topically as needed for irritation.   Lancet Device Misc 1 Device by Does not apply route daily.   Lantus SoloStar 100 UNIT/ML Solostar Pen Generic drug: insulin glargine Inject 46 Units into the skin daily.   liraglutide 18 MG/3ML Sopn Commonly known as: Victoza Inject 1.8 mg into the skin daily in the afternoon.   methocarbamol 500 MG tablet Commonly known as: ROBAXIN TAKE 1 TABLET(500 MG) BY MOUTH EVERY 8 HOURS AS NEEDED FOR MUSCLE SPASMS   Omnipod 5 G7 Intro (Gen 5) Kit 1 Device by Does not apply route every other day.   Omnipod 5 G7 Pods (Gen 5) Misc 1 Device by Does not apply route every other day.   ondansetron 4 MG tablet Commonly known as: ZOFRAN TAKE 1 TABLET(4 MG) BY MOUTH EVERY 8 HOURS AS NEEDED FOR NAUSEA OR VOMITING   OXYGEN Inhale 2 L into the lungs  continuous.   pantoprazole 40 MG tablet Commonly known as: PROTONIX TAKE 1 TABLET(40 MG) BY MOUTH TWICE DAILY BEFORE A MEAL   potassium chloride SA 20 MEQ tablet Commonly known as: KLOR-CON M Take 1 tablet (20 mEq total) by mouth 2 (two) times daily.   Red Yeast Rice 600 MG Caps Take 1,200 mg by mouth daily.   rOPINIRole 2 MG tablet Commonly known as: REQUIP TAKE 1 TABLET(2 MG) BY MOUTH AT BEDTIME   rosuvastatin 10 MG tablet Commonly known as: CRESTOR TAKE 1 TABLET BY MOUTH EVERY  DAY What changed: when to take this   simethicone 80 MG chewable tablet Commonly known as: MYLICON Chew 80 mg by mouth every 6 (six) hours as needed for flatulence.   tiZANidine 2 MG tablet Commonly known as: ZANAFLEX TAKE 1 TABLET BY MOUTH TWICE DAILY What changed:  when to take this reasons to take this   topiramate 25 MG tablet Commonly known as: TOPAMAX TAKE 1 TABLET BY MOUTH EVERY DAY   traMADol 50 MG tablet Commonly known as: ULTRAM TAKE 1 TABLET(50 MG) BY MOUTH TWICE DAILY AS NEEDED   traZODone 50 MG tablet Commonly known as: DESYREL TAKE 2 TABLETS(100 MG) BY MOUTH AT BEDTIME   triamcinolone cream 0.1 % Commonly known as: KENALOG Apply 1 application. topically 2 (two) times daily. Apply to affected areas on shin area What changed:  when to take this reasons to take this additional instructions   Vitamin D (Ergocalciferol) 1.25 MG (50000 UNIT) Caps capsule Commonly known as: DRISDOL TAKE 1 CAPSULE BY MOUTH EVERY 7 DAYS        Review of Systems  Constitutional:  Negative for appetite change, chills, fatigue, fever and unexpected weight change.  HENT:  Negative for congestion, dental problem, ear discharge, ear pain, facial swelling, hearing loss, nosebleeds, postnasal drip, rhinorrhea, sinus pressure, sinus pain, sneezing, sore throat, tinnitus and trouble swallowing.   Eyes:  Negative for pain, discharge, redness, itching and visual disturbance.  Respiratory:  Negative  for cough, chest tightness, shortness of breath and wheezing.   Cardiovascular:  Negative for chest pain, palpitations and leg swelling.  Gastrointestinal:  Negative for abdominal distention, abdominal pain, blood in stool, constipation, diarrhea, nausea and vomiting.  Endocrine: Negative for cold intolerance, heat intolerance, polydipsia, polyphagia and polyuria.  Genitourinary:  Negative for difficulty urinating, dysuria, flank pain, frequency and urgency.  Musculoskeletal:  Negative for arthralgias, back pain, gait problem, joint swelling, myalgias, neck pain and neck stiffness.  Skin:  Negative for color change, pallor, rash and wound.  Neurological:  Positive for numbness. Negative for dizziness, syncope, speech difficulty, weakness, light-headedness and headaches.       Worst on feets    Hematological:  Does not bruise/bleed easily.  Psychiatric/Behavioral:  Negative for agitation, behavioral problems, confusion, hallucinations, self-injury, sleep disturbance and suicidal ideas. The patient is not nervous/anxious.     Immunization History  Administered Date(s) Administered   COVID-19, mRNA, vaccine(Comirnaty)12 years and older 11/19/2021   Fluad Quad(high Dose 65+) 11/19/2020, 11/02/2021   H1N1 01/24/2008   Moderna Sars-Covid-2 Vaccination 04/03/2019, 05/05/2019, 12/20/2019, 05/23/2020   PNEUMOCOCCAL CONJUGATE-20 06/28/2022   Pfizer Covid-19 Vaccine Bivalent Booster 99yrs & up 12/11/2020   Rsv, Bivalent, Protein Subunit Rsvpref,pf Verdis Frederickson) 11/19/2021   Zoster Recombinant(Shingrix) 03/01/2020, 05/23/2020   Pertinent  Health Maintenance Due  Topic Date Due   INFLUENZA VACCINE  09/20/2022   HEMOGLOBIN A1C  03/27/2023   OPHTHALMOLOGY EXAM  06/21/2023   FOOT EXAM  10/01/2023   Colonoscopy  Discontinued      02/12/2022    8:36 PM 02/13/2022    8:00 AM 02/20/2022    1:51 PM 06/28/2022   11:22 AM 10/09/2022   11:29 AM  Fall Risk  Falls in the past year?   0 0 0  Was there an  injury with Fall?   0 0 0  Fall Risk Category Calculator   0 0 0  Fall Risk Category (Retired)   Low    (RETIRED) Patient Fall Risk Level Moderate fall risk Moderate fall risk Low fall  risk    Patient at Risk for Falls Due to   No Fall Risks No Fall Risks No Fall Risks  Fall risk Follow up   Falls evaluation completed Falls evaluation completed Falls evaluation completed   Functional Status Survey:    Vitals:   10/09/22 1020  BP: 129/88  Pulse: 70  Resp: 18  Temp: 97.9 F (36.6 C)  SpO2: 95%  Weight: (!) 364 lb (165.1 kg)  Height: 6' (1.829 m)   Body mass index is 49.37 kg/m. Physical Exam Vitals reviewed.  Constitutional:      General: He is not in acute distress.    Appearance: Normal appearance. He is obese. He is not ill-appearing or diaphoretic.  HENT:     Head: Normocephalic.     Right Ear: Tympanic membrane, ear canal and external ear normal. There is no impacted cerumen.     Left Ear: Tympanic membrane, ear canal and external ear normal. There is no impacted cerumen.     Nose: Nose normal. No congestion or rhinorrhea.     Mouth/Throat:     Mouth: Mucous membranes are moist.     Pharynx: Oropharynx is clear. No oropharyngeal exudate or posterior oropharyngeal erythema.  Eyes:     General: No scleral icterus.       Right eye: No discharge.        Left eye: No discharge.     Extraocular Movements: Extraocular movements intact.     Conjunctiva/sclera: Conjunctivae normal.     Pupils: Pupils are equal, round, and reactive to light.  Neck:     Vascular: No carotid bruit.  Cardiovascular:     Rate and Rhythm: Normal rate and regular rhythm.     Pulses: Normal pulses.     Heart sounds: Normal heart sounds. No murmur heard.    No friction rub. No gallop.  Pulmonary:     Effort: Pulmonary effort is normal. No respiratory distress.     Breath sounds: Normal breath sounds. No wheezing, rhonchi or rales.  Chest:     Chest wall: No tenderness.  Abdominal:      General: Bowel sounds are normal. There is no distension.     Palpations: Abdomen is soft. There is no mass.     Tenderness: There is no abdominal tenderness. There is no right CVA tenderness, left CVA tenderness, guarding or rebound.  Musculoskeletal:        General: No swelling or tenderness. Normal range of motion.     Cervical back: Normal range of motion. No rigidity or tenderness.     Right lower leg: No edema.     Left lower leg: No edema.  Lymphadenopathy:     Cervical: No cervical adenopathy.  Skin:    General: Skin is warm and dry.     Coloration: Skin is not pale.     Findings: No bruising, erythema, lesion or rash.  Neurological:     Mental Status: He is alert and oriented to person, place, and time.     Cranial Nerves: No cranial nerve deficit.     Sensory: No sensory deficit.     Motor: No weakness.     Coordination: Coordination normal.     Gait: Gait abnormal.  Psychiatric:        Mood and Affect: Mood normal.        Speech: Speech normal.        Behavior: Behavior normal.        Thought Content: Thought content  normal.        Judgment: Judgment normal.    Labs reviewed: Recent Labs    02/10/22 1623 02/11/22 0209 05/23/22 0348 06/06/22 0000 09/25/22 0045 09/26/22 0452 09/27/22 0606  NA 138   < > 137   < > 140 143 135  K 4.3   < > 3.5   < > 4.4 3.6 3.2*  CL 100   < > 105   < > 107 110 102  CO2 19*   < > 21*   < > 20* 23 23  GLUCOSE 219*   < > 182*   < > 237* 216* 185*  BUN 75*   < > 31*   < > 22 33* 27*  CREATININE 3.92*   < > 1.91*   < > 1.44* 1.81* 1.75*  CALCIUM 8.9   < > 8.5*   < > 8.7* 8.6* 8.3*  MG  --    < > 2.2  --   --  3.0* 2.7*  PHOS 4.9*  --   --   --   --   --  1.8*   < > = values in this interval not displayed.   Recent Labs    05/21/22 0356 06/06/22 0000 09/24/22 0155 09/27/22 0606  AST 14* 13* 15 15  ALT 14 11 16 12   ALKPHOS 80 96 85 74  BILITOT 0.4  --  0.6 0.8  PROT 6.5  --  6.9 6.8  ALBUMIN 3.2* 4.0 3.5 3.3*   Recent  Labs    09/24/22 0155 09/25/22 0045 09/26/22 0452 09/27/22 0606  WBC 7.1 10.6* 7.4 6.5  NEUTROABS 5.2  --  5.4 4.2  HGB 11.0* 12.6* 12.7* 11.6*  HCT 36.9* 43.5 43.4 39.4  MCV 90.0 91.8 91.9 90.0  PLT 250 237 236 212   Lab Results  Component Value Date   TSH 1.58 12/20/2021   Lab Results  Component Value Date   HGBA1C 10.1 (H) 09/24/2022   Lab Results  Component Value Date   CHOL 131 12/20/2021   HDL 47 12/20/2021   LDLCALC 65 12/20/2021   TRIG 104 12/20/2021   CHOLHDL 3.2 11/02/2021    Significant Diagnostic Results in last 30 days:  DG Abd 1 View  Result Date: 09/27/2022 CLINICAL DATA:  Small-bowel obstruction EXAM: ABDOMEN - 1 VIEW COMPARISON:  KUB 2 days prior, CT abdomen/pelvis 3 days prior FINDINGS: The enteric catheter has been removed. There is significant gaseous distention of the stomach. There is a relative paucity of bowel gas in the remainder of the imaged abdomen. There is no definite free intraperitoneal air. Cardiac device leads are partially imaged. A left pleural effusion is partially imaged. There is no acute osseous abnormality. IMPRESSION: 1. Interval removal of the enteric catheter with significant gaseous distention of the stomach but a relative paucity of bowel gas in the remainder of the imaged abdomen. 2. Incompletely imaged left pleural effusion. Electronically Signed   By: Lesia Hausen M.D.   On: 09/27/2022 08:30   DG Abd Portable 1V  Result Date: 09/25/2022 CLINICAL DATA:  72 year old male with ventral abdominal hernia and CT appearance suspicious for SBO yesterday. EXAM: PORTABLE ABDOMEN - 1 VIEW COMPARISON:  0010 hours today and earlier. FINDINGS: Portable AP views of the abdomen and pelvis at 1026 hours. Enteric tube side hole still projects over the gas-filled gastric body. Stable visible lung bases. Increased gas in dilated small bowel loops in the left lower quadrant compared to the CT scout  view yesterday. And ongoing paucity of large bowel gas,  including the right colon. Underlying large in complex ventral abdominal hernia better demonstrated by CT. Additionally, gastric contrast was present earlier today, and now conveyed identified in the right abdomen and over the pelvis. No acute osseous abnormality identified.  Cardiac pacemaker leads. IMPRESSION: 1. Stable enteric tube terminating at the gastric body. 2. Complex ventral abdominal hernia better demonstrated by CT. Oral contrast visible in right abdominal and pelvic small bowel since 0010 hours today. Continued gas distended left abdominal small bowel loops, and paucity of large bowel gas. Constellation favors a partial small bowel obstruction. Electronically Signed   By: Odessa Fleming M.D.   On: 09/25/2022 12:37   DG Abd Portable 1V-Small Bowel Obstruction Protocol-initial, 8 hr delay  Result Date: 09/25/2022 CLINICAL DATA:  SBO 8 hour delay EXAM: PORTABLE ABDOMEN - 1 VIEW COMPARISON:  September 24, 2022 (8:48 a.m.) FINDINGS: There is stable enteric tube positioning. The stomach remains distended with a mild amount of radiopaque contrast seen within the region of the gastric fundus. Distended loops of air-filled bowel are again seen within the visualized portion of the mid abdomen. IMPRESSION: Persistent gastric distention with a mild amount of radiopaque contrast seen within the region of the gastric fundus. Electronically Signed   By: Aram Candela M.D.   On: 09/25/2022 00:38   DG Abd Portable 1V-Small Bowel Protocol-Position Verification  Result Date: 09/24/2022 CLINICAL DATA:  [id number] Encounter for imaging study to confirm nasogastric (NG) tube placement [id number] EXAM: PORTABLE ABDOMEN - 1 VIEW COMPARISON:  09/24/2022 FINDINGS: Limited radiograph of the lower chest and upper abdomen was obtained for the purposes of enteric tube localization. Enteric tube is seen coursing below the diaphragm with distal tip and side port terminating within the expected location of the gastric body. Small left pleural  effusion. IMPRESSION: Enteric tube terminates within the expected location of the gastric body. Electronically Signed   By: Duanne Guess D.O.   On: 09/24/2022 09:11   DG Abd Portable 1 View  Result Date: 09/24/2022 CLINICAL DATA:  73 year old male status post nasogastric tube placement. EXAM: PORTABLE ABDOMEN - 1 VIEW COMPARISON:  Abdominal radiograph 05/22/2022. FINDINGS: Nasogastric tube extends into the proximal stomach with side port just distal to the gastroesophageal junction. The lower abdomen is incompletely imaged. In the visualized upper abdomen, bowel-gas pattern is unremarkable. Cardiomegaly. Pacemaker leads noted. IMPRESSION: 1. Nasogastric tube is in the proximal stomach and could be advanced approximately 10 cm for more optimal placement. Electronically Signed   By: Trudie Reed M.D.   On: 09/24/2022 05:32   CT ABDOMEN PELVIS W CONTRAST  Result Date: 09/24/2022 CLINICAL DATA:  Abdominal pain.  Bowel obstruction suspected. EXAM: CT ABDOMEN AND PELVIS WITH CONTRAST TECHNIQUE: Multidetector CT imaging of the abdomen and pelvis was performed using the standard protocol following bolus administration of intravenous contrast. RADIATION DOSE REDUCTION: This exam was performed according to the departmental dose-optimization program which includes automated exposure control, adjustment of the mA and/or kV according to patient size and/or use of iterative reconstruction technique. CONTRAST:  OMNIPAQUE IOHEXOL 300 MG/ML  SOLN COMPARISON:  05/21/2022. FINDINGS: Lower chest: A small pericardial effusion is noted. Pacemaker leads are seen in the heart. There is a small left pleural effusion with atelectasis or infiltrate. Mild atelectasis is noted at the right lung base. Hepatobiliary: The liver has a nodular contour, compatible with underlying cirrhosis. Subcentimeter hypodensity is present in the left lobe of the liver, suggesting cyst. No biliary  ductal dilatation. The gallbladder is without  stones. Pancreas: Unremarkable. No pancreatic ductal dilatation or surrounding inflammatory changes. Spleen: Normal in size without focal abnormality. Adrenals/Urinary Tract: The adrenal glands are within normal limits. Renal cortical thinning is noted bilaterally. There is a masslike region in the lower pole of the left kidney measuring 4.3 cm, increased in size from the prior exam. No renal calculus or hydronephrosis. The bladder is unremarkable. Stomach/Bowel: There is a large complex ventral abdominal wall hernia in the midline containing the stomach, small bowel, and colon. Appendix is not seen. Mildly distended loops of small bowel are noted in the abdomen measuring up to 3.6 cm with air-fluid levels. There is a suspected transition point in the ventral abdominal wall hernia, not well delineated due to bowel clumping and field-of-view, however both distended and a collapsed loops of small bowel in the hernia sac. There is a left lower quadrant broad-based spigelian hernia with dilatation of a loop small bowel proximal to the efferent loop measuring 5.3 cm with transition point along the medial edge of the hernia. The distal small bowel is completely compressed. No free air or pneumatosis. A right lower quadrant colostomy is noted. Vascular/Lymphatic: Aortic atherosclerosis. No enlarged abdominal or pelvic lymph nodes. Reproductive: Prostate is unremarkable. Other: Small amount of free fluid is noted in the hernia sac in the midline. Musculoskeletal: No acute osseous abnormality. IMPRESSION: 1. Multiple dilated loops of small bowel in the abdomen measuring up to 5.3 cm with suspected transition points in complex ventral and left spigelian large hernias, suspicious for bowel obstruction as detailed above. Surgical consultation is recommended. 2. Left renal mass measuring 4.3 cm, increased in size from the prior exam and concerning for malignancy. 3. Small left pleural effusion with atelectasis or infiltrate. 4.  Morphologic changes of cirrhosis. 5. Aortic atherosclerosis. Critical Value/emergent results were called by telephone at the time of interpretation on 09/24/2022 at 3:41 am to provider Roxy Horseman , who verbally acknowledged these results. Electronically Signed   By: Thornell Sartorius M.D.   On: 09/24/2022 03:44   CUP PACEART REMOTE DEVICE CHECK  Result Date: 09/14/2022 Scheduled remote reviewed. Normal device function.  3 AMS, 10-16sec in duration, hx of PAF Burden 2.9%, Eliquis per PA report Next remote 91 days. LA, CVRS   Assessment/Plan 1. Benign hypertension with CKD (chronic kidney disease) stage III (HCC) B/p well controlled continue on Imdur and furosemide  - Lipid panel - TSH - COMPLETE METABOLIC PANEL WITH GFR - CBC with Differential/Platelet  2. Type 2 diabetes mellitus with stage 3 chronic kidney disease, without long-term current use of insulin, unspecified whether stage 3a or 3b CKD (HCC) Lab Results  Component Value Date   HGBA1C 10.1 (H) 09/24/2022  Recently seen by Endocrinologist  - on continuous glucose monitor  - continue on Jardiance,glipizide,Lantus and vitcoza  - Lipid panel  3. Gastroesophageal reflux disease without esophagitis Symptoms controlled. H/H stable.No tarry or black stool  - advised to avoid eating meals late in the evening and to avoid aggravating foods and spices. - continue on Protonix  - COMPLETE METABOLIC PANEL WITH GFR - CBC with Differential/Platelet   Family/ staff Communication: Reviewed plan of care with patient verbalized understanding  Labs/tests ordered:  - COMPLETE METABOLIC PANEL WITH GFR - CBC with Differential/Platelet  Next Appointment : Return in about 6 months (around 04/11/2023) for medical mangement of chronic issues.Jesse Bookman, NP

## 2022-10-10 ENCOUNTER — Ambulatory Visit: Payer: Self-pay

## 2022-10-10 LAB — CBC WITH DIFFERENTIAL/PLATELET
Absolute Monocytes: 730 {cells}/uL (ref 200–950)
Basophils Absolute: 37 {cells}/uL (ref 0–200)
Basophils Relative: 0.5 %
Eosinophils Absolute: 183 {cells}/uL (ref 15–500)
Eosinophils Relative: 2.5 %
HCT: 39.9 % (ref 38.5–50.0)
Hemoglobin: 12.4 g/dL — ABNORMAL LOW (ref 13.2–17.1)
Lymphs Abs: 781 {cells}/uL — ABNORMAL LOW (ref 850–3900)
MCH: 27.1 pg (ref 27.0–33.0)
MCHC: 31.1 g/dL — ABNORMAL LOW (ref 32.0–36.0)
MCV: 87.3 fL (ref 80.0–100.0)
MPV: 8.7 fL (ref 7.5–12.5)
Monocytes Relative: 10 %
Neutro Abs: 5570 {cells}/uL (ref 1500–7800)
Neutrophils Relative %: 76.3 %
Platelets: 263 10*3/uL (ref 140–400)
RBC: 4.57 10*6/uL (ref 4.20–5.80)
RDW: 16.8 % — ABNORMAL HIGH (ref 11.0–15.0)
Total Lymphocyte: 10.7 %
WBC: 7.3 10*3/uL (ref 3.8–10.8)

## 2022-10-10 LAB — COMPLETE METABOLIC PANEL WITH GFR
AG Ratio: 1.5 (calc) (ref 1.0–2.5)
ALT: 11 U/L (ref 9–46)
AST: 13 U/L (ref 10–35)
Albumin: 4.1 g/dL (ref 3.6–5.1)
Alkaline phosphatase (APISO): 74 U/L (ref 35–144)
BUN/Creatinine Ratio: 17 (calc) (ref 6–22)
BUN: 31 mg/dL — ABNORMAL HIGH (ref 7–25)
CO2: 25 mmol/L (ref 20–32)
Calcium: 9.1 mg/dL (ref 8.6–10.3)
Chloride: 102 mmol/L (ref 98–110)
Creat: 1.85 mg/dL — ABNORMAL HIGH (ref 0.70–1.28)
Globulin: 2.7 g/dL (ref 1.9–3.7)
Glucose, Bld: 163 mg/dL — ABNORMAL HIGH (ref 65–99)
Potassium: 3.9 mmol/L (ref 3.5–5.3)
Sodium: 138 mmol/L (ref 135–146)
Total Bilirubin: 0.4 mg/dL (ref 0.2–1.2)
Total Protein: 6.8 g/dL (ref 6.1–8.1)
eGFR: 38 mL/min/{1.73_m2} — ABNORMAL LOW (ref >=60)

## 2022-10-10 LAB — LIPID PANEL
Cholesterol: 152 mg/dL (ref <200)
HDL: 50 mg/dL (ref >=40)
LDL Cholesterol (Calc): 75 mg/dL
Non-HDL Cholesterol (Calc): 102 mg/dL (ref <130)
Total CHOL/HDL Ratio: 3 (calc) (ref <5.0)
Triglycerides: 168 mg/dL — ABNORMAL HIGH (ref <150)

## 2022-10-10 LAB — TSH: TSH: 1.05 m[IU]/L (ref 0.40–4.50)

## 2022-10-11 ENCOUNTER — Telehealth: Payer: Self-pay | Admitting: Internal Medicine

## 2022-10-11 NOTE — Patient Instructions (Signed)
Visit Information  Thank you for taking time to visit with me today. Please don't hesitate to contact me if I can be of assistance to you.   Following are the goals we discussed today:   Goals Addressed             This Visit's Progress    To better manage diabetes and lower A1c       Care Coordination Interventions: Provided education to patient about basic DM disease process Reviewed medications with patient and discussed importance of medication adherence Provided patient with written educational materials related to hypo and hyperglycemia and importance of correct treatment Advised patient, providing education and rationale, to check cbg daily before meals and at bedtime  and record, calling PCP for findings outside established parameters Review of patient status, including review of consultants reports, relevant laboratory and other test results, and medications completed Mailed printed educational materials related to diabetes management Lab Results  Component Value Date   HGBA1C 10.1 (H) 09/24/2022       To maintain and or improve CKD       Care Coordination Interventions: Assessed patient understanding of chronic kidney disease and potential complications including Anemia  Evaluation of current treatment plan related to chronic kidney disease self management and patient's adherence to plan as established by provider      Reviewed prescribed diet increase daily water intake to 48-64 oz unless otherwise directed  Advised patient to discuss Epogen injections with his provider for management of chronic anemia   Discussed the impact of chronic kidney disease on daily life and mental health and acknowledged and normalized feelings of disempowerment, fear, and frustration    Provided education on kidney disease progression    Engage patient in early, proactive and ongoing discussion about goals of care and what matters most to them Mailed printed educational materials related to  chronic kidney disease Last practice recorded BP readings:  BP Readings from Last 3 Encounters:  10/09/22 129/88  10/01/22 122/70  09/27/22 127/72   Most recent eGFR/CrCl:  Lab Results  Component Value Date   EGFR 38 (L) 10/09/2022    No components found for: "CRCL"            Our next appointment is by telephone on 10/31/22 at 11:30 AM  Please call the care guide team at [phone] if you need to cancel or reschedule your appointment.   If you are experiencing a Mental Health or Behavioral Health Crisis or need someone to talk to, please call 1-800-273-TALK (toll free, 24 hour hotline)  Patient verbalizes understanding of instructions and care plan provided today and agrees to view in MyChart. Active MyChart status and patient understanding of how to access instructions and care plan via MyChart confirmed with patient.     Delsa Sale, RN, BSN, CCM Care Management Coordinator [facility] Care Management Direct Phone: [phone]

## 2022-10-11 NOTE — Patient Outreach (Signed)
Care Coordination   Initial Visit Note   10/10/2022 Name: Jesse [name] MRN: [id number] DOB: [birth year]  Jesse [name] is a 72 y.o. year old male who sees Ngetich, [name], NP for primary care. I spoke with  Jesse [name] by phone today.  What matters to the patients health and wellness today?  Patient would like to better manage his diabetes and chronic kidney disease.     Goals Addressed             This Visit's Progress    To better manage diabetes and lower A1c       Care Coordination Interventions: Provided education to patient about basic DM disease process Reviewed medications with patient and discussed importance of medication adherence Provided patient with written educational materials related to hypo and hyperglycemia and importance of correct treatment Advised patient, providing education and rationale, to check cbg daily before meals and at bedtime  and record, calling PCP for findings outside established parameters Review of patient status, including review of consultants reports, relevant laboratory and other test results, and medications completed Mailed printed educational materials related to diabetes management Lab Results  Component Value Date   HGBA1C 10.1 (H) 09/24/2022       To maintain and or improve CKD       Care Coordination Interventions: Assessed patient understanding of chronic kidney disease and potential complications including Anemia  Evaluation of current treatment plan related to chronic kidney disease self management and patient's adherence to plan as established by provider      Reviewed prescribed diet increase daily water intake to 48-64 oz unless otherwise directed  Advised patient to discuss Epogen injections with his provider for management of chronic anemia   Discussed the impact of chronic kidney disease on daily life and mental health and acknowledged and normalized feelings of disempowerment, fear, and frustration     Provided education on kidney disease progression    Engage patient in early, proactive and ongoing discussion about goals of care and what matters most to them Mailed printed educational materials related to chronic kidney disease Last practice recorded BP readings:  BP Readings from Last 3 Encounters:  10/09/22 129/88  10/01/22 122/70  09/27/22 127/72   Most recent eGFR/CrCl:  Lab Results  Component Value Date   EGFR 38 (L) 10/09/2022    No components found for: "CRCL"       Interventions Today    Flowsheet Row Most Recent Value  Chronic Disease   Chronic disease during today's visit Diabetes, Other  [Anemia]  General Interventions   General Interventions Discussed/Reviewed General Interventions Discussed, General Interventions Reviewed, Doctor Visits, Labs, Durable Medical Equipment (DME)  Doctor Visits Discussed/Reviewed Doctor Visits Discussed, Doctor Visits Reviewed, PCP, Specialist  Durable Medical Equipment (DME) Insulin Pump, Glucomoter  Exercise Interventions   Exercise Discussed/Reviewed Exercise Reviewed, Exercise Discussed, Physical Activity  Physical Activity Discussed/Reviewed Physical Activity Discussed, Physical Activity Reviewed  Education Interventions   Education Provided Provided Education, Provided Printed Education  Provided Verbal Education On Nutrition, Labs, Medication, When to see the doctor  Labs Reviewed Hgb A1c  Nutrition Interventions   Nutrition Discussed/Reviewed Nutrition Discussed, Nutrition Reviewed, Carbohydrate meal planning, Portion sizes, Decreasing sugar intake  Pharmacy Interventions   Pharmacy Dicussed/Reviewed Pharmacy Topics Discussed, Pharmacy Topics Reviewed, Medications and their functions          SDOH assessments and interventions completed:  No     Care Coordination Interventions:  Yes, provided   Follow up plan:  Referral made to [facility] pharmacy team to assist with cost of Jardiance, Lantus, Victoza, Eliquis  Follow up  call scheduled for 10/31/22 @11 :30 AM    Encounter Outcome:  Pt. Visit Completed

## 2022-10-11 NOTE — Telephone Encounter (Signed)
Left voicemail for patient to return call to office. 

## 2022-10-11 NOTE — Telephone Encounter (Signed)
Pt c/o medication issue:  1. Name of Medication: metolazone 2.5mg   2. How are you currently taking this medication (dosage and times per day)?   3. Are you having a reaction (difficulty breathing--STAT)?   4. What is your medication issue?  Nurse Waynetta Sandy, from [place] called to get clarification on this medication if it's okay for patient to take it.

## 2022-10-12 ENCOUNTER — Telehealth: Payer: Self-pay | Admitting: Internal Medicine

## 2022-10-12 DIAGNOSIS — E119 Type 2 diabetes mellitus without complications: Secondary | ICD-10-CM | POA: Diagnosis not present

## 2022-10-12 DIAGNOSIS — E118 Type 2 diabetes mellitus with unspecified complications: Secondary | ICD-10-CM | POA: Diagnosis not present

## 2022-10-12 DIAGNOSIS — K56609 Unspecified intestinal obstruction, unspecified as to partial versus complete obstruction: Secondary | ICD-10-CM | POA: Diagnosis not present

## 2022-10-12 DIAGNOSIS — Z794 Long term (current) use of insulin: Secondary | ICD-10-CM | POA: Diagnosis not present

## 2022-10-12 DIAGNOSIS — Z933 Colostomy status: Secondary | ICD-10-CM | POA: Diagnosis not present

## 2022-10-12 NOTE — Telephone Encounter (Signed)
Call to [facility] , no answer. LM that will call patient . Patient states his nephrologist put him on this medication.  He takes PRN when fluid builds up.  He has been on this for several yrs from [facility] gave him the directions to take. Will notify provider and add to med list.  Metolazone 2.5 mg  Takes 1-2 times a week, spaces out 3 days apart for edema.

## 2022-10-12 NOTE — Telephone Encounter (Signed)
Patient advising he has gotten his insulin and his pump waiting on instructions. Patient advising he need 2 day for transportation

## 2022-10-15 ENCOUNTER — Other Ambulatory Visit: Payer: Self-pay | Admitting: Family

## 2022-10-15 ENCOUNTER — Telehealth: Payer: Self-pay

## 2022-10-15 DIAGNOSIS — N5201 Erectile dysfunction due to arterial insufficiency: Secondary | ICD-10-CM | POA: Diagnosis not present

## 2022-10-15 DIAGNOSIS — D49512 Neoplasm of unspecified behavior of left kidney: Secondary | ICD-10-CM | POA: Diagnosis not present

## 2022-10-15 NOTE — Progress Notes (Signed)
   Care Guide Note  10/15/2022 Name: Jesse [name] MRN: [id number] DOB: 1950/11/10  Referred by: Caesar Bookman, NP Reason for referral : Care Management (Outreach to schedule with Pharm d )   Jesse [name] is a 72 y.o. year old male who is a primary care patient of Ngetich, [name] C, NP. Jesse [name] was referred to the pharmacist for assistance related to HTN and DM.    Successful contact was made with the patient to discuss pharmacy services including being ready for the pharmacist to call at least 5 minutes before the scheduled appointment time, to have medication bottles and any blood sugar or blood pressure readings ready for review. The patient agreed to meet with the pharmacist via with the pharmacist via telephone visit on (date/time).  10/16/2022  Penne Lash, RMA Care Guide [facility]  [place], [place] [address] Direct Dial: [phone] [email or web address]

## 2022-10-16 ENCOUNTER — Other Ambulatory Visit: Payer: Medicare PPO | Admitting: Pharmacist

## 2022-10-16 DIAGNOSIS — E1169 Type 2 diabetes mellitus with other specified complication: Secondary | ICD-10-CM | POA: Diagnosis not present

## 2022-10-16 DIAGNOSIS — E1140 Type 2 diabetes mellitus with diabetic neuropathy, unspecified: Secondary | ICD-10-CM | POA: Diagnosis not present

## 2022-10-16 DIAGNOSIS — K56699 Other intestinal obstruction unspecified as to partial versus complete obstruction: Secondary | ICD-10-CM | POA: Diagnosis not present

## 2022-10-16 DIAGNOSIS — E1122 Type 2 diabetes mellitus with diabetic chronic kidney disease: Secondary | ICD-10-CM | POA: Diagnosis not present

## 2022-10-16 DIAGNOSIS — K436 Other and unspecified ventral hernia with obstruction, without gangrene: Secondary | ICD-10-CM | POA: Diagnosis not present

## 2022-10-16 DIAGNOSIS — E785 Hyperlipidemia, unspecified: Secondary | ICD-10-CM | POA: Diagnosis not present

## 2022-10-16 DIAGNOSIS — I5032 Chronic diastolic (congestive) heart failure: Secondary | ICD-10-CM | POA: Diagnosis not present

## 2022-10-16 DIAGNOSIS — I130 Hypertensive heart and chronic kidney disease with heart failure and stage 1 through stage 4 chronic kidney disease, or unspecified chronic kidney disease: Secondary | ICD-10-CM | POA: Diagnosis not present

## 2022-10-16 DIAGNOSIS — N184 Chronic kidney disease, stage 4 (severe): Secondary | ICD-10-CM | POA: Diagnosis not present

## 2022-10-16 NOTE — Patient Instructions (Signed)
Casimiro Needle,  It was a pleasure speaking with you today. Unfortunately, I have exhausted all searches for medication savings programs that would be eligible based on your income. Between a rock and a hard place!  I am going to touch base with you in a few weeks, hopefully after your insulin pump is all set up, to see how things are going with that transition. I'll schedule [place] another phone call in the afternoon - if we need to reschedule, give me a call at the number below.  Otherwise, you can reach me at [phone] and leave a voicemail if you have any questions about medications.  Take care, Elmarie Shiley, PharmD, BCPS Clinical Pharmacist [facility]

## 2022-10-16 NOTE — Telephone Encounter (Signed)
Patient will contact [employer] for the training on the pump and give [place] a call if he needs to come in for further training at that time

## 2022-10-16 NOTE — Progress Notes (Signed)
   10/16/2022 Name: KIANDRE ANDRY MRN: [id number] DOB: May 11, 1950   TAEVION HOLTORF is a 72 y.o. year old male who presented for a telephone visit.   They were referred to the pharmacist by their Case Management Team  for assistance in managing medication - cost concerns.    Subjective:  Care Team: Primary Care Provider: Caesar Bookman, NP   Medication Access/Adherence  Current Pharmacy:  [facility] [id number] [place], [place] - [address] AT [facility] [address] & [facility] [address] [place] [address] Phone: [phone] Fax: [phone]  [facility] [address] [place] [address] Phone: [phone] Fax: [phone]  [facility] [place] (New Address) - [place], [place] - [address] AT Previously: [place], [place] [address] [place] [address] Phone: [phone] Fax: [phone]   Patient reports affordability concerns with their medications: Yes - eliquis $40, lantus, victoza, jardiance $80 for 90DS  Patient reports access/transportation concerns to their pharmacy: No , uses [facility] for travel to appointments (will not deliver controlled or refrigerated medications - sends a family member for those). [facility] mail order for lantus, victoza, insulin.  Patient reports adherence concerns with their medications:  No     Diabetes:  Current medications: lantus 46 units daily, glipizide 5mg  daily, victoza 1.8mg  daily, pending an [employer] pump & novolog setup  Managed by endocrine, can discuss details of BG control in future visits if desired  Current medication access support: none at present    Objective:  Lab Results  Component Value Date   HGBA1C 10.1 (H) 09/24/2022    Lab Results  Component Value Date   CREATININE 1.85 (H) 10/09/2022   BUN 31 (H) 10/09/2022   NA 138 10/09/2022   K 3.9  10/09/2022   CL 102 10/09/2022   CO2 25 10/09/2022    Lab Results  Component Value Date   CHOL 152 10/09/2022   HDL 50 10/09/2022   LDLCALC 75 10/09/2022   TRIG 168 (H) 10/09/2022   CHOLHDL 3.0 10/09/2022    Medications Reviewed Today   Medications were not reviewed in this encounter       Assessment/Plan:   Diabetes: - Currently uncontrolled, though plans in place for medication adjustments, followed by endocrine - Recommend to continue current regimen  - Assessed for medicare LIS/Extra help program, assessed for patient assistance programs, and cost savings through alternative medications within same class. Unfortunately, does not meet income criteria for eligibility.  Will continue to monitor for any savings programs and collaborate with care manager to screen for financial support eligibility not related to medications.   Follow Up Plan: 3-4 weeks after insulin transitioned to pump to assess logistics and cost of medication access  Lynnda Shields, PharmD, BCPS Clinical Pharmacist [facility]

## 2022-10-17 ENCOUNTER — Telehealth: Payer: Self-pay | Admitting: Internal Medicine

## 2022-10-17 NOTE — Telephone Encounter (Signed)
He has a mixed system which means one of his leads is manufactured by one of the other large PPM companies. Larger [employer] like [employer], [employer] and [employer] usually can accommodate mixed systems. Unfortunately they are all likely at least the same distance or further.

## 2022-10-17 NOTE — Telephone Encounter (Signed)
  The patient mentioned that a malignant mass was found in his kidney, and he needs to undergo an MRI. 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 He would like to ask Dr. Ladona Ridgel if there is a closer location where he could get the MRI done

## 2022-10-23 NOTE — Telephone Encounter (Signed)
  Spoke with pt and the provided information given below, and he shared some important updates. 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 He will also be having an MRI there.  The patient would like Dr. Graciela Husbands to be informed of this new diagnosis. Additionally, he wanted to make Dr. Graciela Husbands aware that [facility] has declined to treat him again due to the type of his pacemaker

## 2022-10-24 ENCOUNTER — Telehealth: Payer: Self-pay

## 2022-10-24 ENCOUNTER — Ambulatory Visit: Payer: Medicare PPO | Admitting: Internal Medicine

## 2022-10-24 DIAGNOSIS — K56699 Other intestinal obstruction unspecified as to partial versus complete obstruction: Secondary | ICD-10-CM | POA: Diagnosis not present

## 2022-10-24 DIAGNOSIS — E785 Hyperlipidemia, unspecified: Secondary | ICD-10-CM | POA: Diagnosis not present

## 2022-10-24 DIAGNOSIS — E1122 Type 2 diabetes mellitus with diabetic chronic kidney disease: Secondary | ICD-10-CM | POA: Diagnosis not present

## 2022-10-24 DIAGNOSIS — N184 Chronic kidney disease, stage 4 (severe): Secondary | ICD-10-CM | POA: Diagnosis not present

## 2022-10-24 DIAGNOSIS — K436 Other and unspecified ventral hernia with obstruction, without gangrene: Secondary | ICD-10-CM | POA: Diagnosis not present

## 2022-10-24 DIAGNOSIS — E1140 Type 2 diabetes mellitus with diabetic neuropathy, unspecified: Secondary | ICD-10-CM | POA: Diagnosis not present

## 2022-10-24 DIAGNOSIS — I130 Hypertensive heart and chronic kidney disease with heart failure and stage 1 through stage 4 chronic kidney disease, or unspecified chronic kidney disease: Secondary | ICD-10-CM | POA: Diagnosis not present

## 2022-10-24 DIAGNOSIS — I5032 Chronic diastolic (congestive) heart failure: Secondary | ICD-10-CM | POA: Diagnosis not present

## 2022-10-24 DIAGNOSIS — E1169 Type 2 diabetes mellitus with other specified complication: Secondary | ICD-10-CM | POA: Diagnosis not present

## 2022-10-24 NOTE — Telephone Encounter (Signed)
Beth, nurse with [facility] home care called requesting extension of orders for diabetic teaching, heart failure and new diagnosis of kidney cancer every other week for 4 weeks. Verbal order was given.  Message sent to Richarda Blade, NP [facility])

## 2022-10-24 NOTE — Telephone Encounter (Signed)
Noted  

## 2022-10-24 NOTE — Telephone Encounter (Signed)
Jesse [name] is calling with [employer] about setting up a training for the patient and when she can come in the office to do it. I advise that you were both out of office this week and she said it not urgent and to send for when you return. Wants to know if we have a contract set up with [employer] for training.   CB:[phone]

## 2022-10-26 ENCOUNTER — Telehealth: Payer: Self-pay

## 2022-10-26 DIAGNOSIS — K436 Other and unspecified ventral hernia with obstruction, without gangrene: Secondary | ICD-10-CM | POA: Diagnosis not present

## 2022-10-26 DIAGNOSIS — K56699 Other intestinal obstruction unspecified as to partial versus complete obstruction: Secondary | ICD-10-CM | POA: Diagnosis not present

## 2022-10-26 DIAGNOSIS — E785 Hyperlipidemia, unspecified: Secondary | ICD-10-CM | POA: Diagnosis not present

## 2022-10-26 DIAGNOSIS — E1122 Type 2 diabetes mellitus with diabetic chronic kidney disease: Secondary | ICD-10-CM | POA: Diagnosis not present

## 2022-10-26 DIAGNOSIS — I130 Hypertensive heart and chronic kidney disease with heart failure and stage 1 through stage 4 chronic kidney disease, or unspecified chronic kidney disease: Secondary | ICD-10-CM | POA: Diagnosis not present

## 2022-10-26 DIAGNOSIS — I5032 Chronic diastolic (congestive) heart failure: Secondary | ICD-10-CM | POA: Diagnosis not present

## 2022-10-26 DIAGNOSIS — N184 Chronic kidney disease, stage 4 (severe): Secondary | ICD-10-CM | POA: Diagnosis not present

## 2022-10-26 DIAGNOSIS — E1169 Type 2 diabetes mellitus with other specified complication: Secondary | ICD-10-CM | POA: Diagnosis not present

## 2022-10-26 DIAGNOSIS — G4733 Obstructive sleep apnea (adult) (pediatric): Secondary | ICD-10-CM | POA: Diagnosis not present

## 2022-10-26 DIAGNOSIS — E1140 Type 2 diabetes mellitus with diabetic neuropathy, unspecified: Secondary | ICD-10-CM | POA: Diagnosis not present

## 2022-10-26 NOTE — Telephone Encounter (Signed)
Contact patient when you return to the office for training.

## 2022-10-29 ENCOUNTER — Telehealth: Payer: Self-pay | Admitting: Nutrition

## 2022-10-29 NOTE — Telephone Encounter (Signed)
Talked with trainer.  Orders faxed to her with directions to have patient schedule return appointment with Dr. Lonzo Cloud in one month.

## 2022-10-29 NOTE — Telephone Encounter (Signed)
Patient was called.  He is getting trained tomorrow by a Teacher, music.  The pump start orders were faxed to [facility] this morning.

## 2022-10-29 NOTE — Telephone Encounter (Signed)
Patient reports that he is being trained tomorrow by a trainer from [employer].  Pump start orders faxed the [facility] at [facility].

## 2022-10-30 DIAGNOSIS — K436 Other and unspecified ventral hernia with obstruction, without gangrene: Secondary | ICD-10-CM | POA: Diagnosis not present

## 2022-10-30 DIAGNOSIS — I5032 Chronic diastolic (congestive) heart failure: Secondary | ICD-10-CM | POA: Diagnosis not present

## 2022-10-30 DIAGNOSIS — I130 Hypertensive heart and chronic kidney disease with heart failure and stage 1 through stage 4 chronic kidney disease, or unspecified chronic kidney disease: Secondary | ICD-10-CM | POA: Diagnosis not present

## 2022-10-30 DIAGNOSIS — E1140 Type 2 diabetes mellitus with diabetic neuropathy, unspecified: Secondary | ICD-10-CM | POA: Diagnosis not present

## 2022-10-30 DIAGNOSIS — E785 Hyperlipidemia, unspecified: Secondary | ICD-10-CM | POA: Diagnosis not present

## 2022-10-30 DIAGNOSIS — E1122 Type 2 diabetes mellitus with diabetic chronic kidney disease: Secondary | ICD-10-CM | POA: Diagnosis not present

## 2022-10-30 DIAGNOSIS — E1169 Type 2 diabetes mellitus with other specified complication: Secondary | ICD-10-CM | POA: Diagnosis not present

## 2022-10-30 DIAGNOSIS — K56699 Other intestinal obstruction unspecified as to partial versus complete obstruction: Secondary | ICD-10-CM | POA: Diagnosis not present

## 2022-10-30 DIAGNOSIS — N184 Chronic kidney disease, stage 4 (severe): Secondary | ICD-10-CM | POA: Diagnosis not present

## 2022-10-31 ENCOUNTER — Ambulatory Visit: Payer: Self-pay

## 2022-11-01 NOTE — Patient Outreach (Signed)
  Care Coordination   Follow Up Visit Note   10/31/2022 Name: DEVANG SECCOMBE MRN: [id number] DOB: [birth year]  EMAD BROCKETT is a 72 y.o. year old male who sees Ngetich, [name], NP for primary care. I spoke with  [name] by phone today.  What matters to the patients health and wellness today?  Patient would like to establish with Dr. Assunta Found with [facility] for evaluation and treatment of his kidney cancer.     Goals Addressed             This Visit's Progress    To maintain and or improve CKD   On track    Care Coordination Interventions: Assessed patient understanding of chronic kidney disease and potential complications including Anemia  Evaluation of current treatment plan related to chronic kidney disease self management and patient's adherence to plan as established by provider      Engage patient in early, proactive and ongoing discussion about goals of care and what matters most to them    Reviewed and discussed with patient his upcoming scheduled visit for initial consultation at [facility] with Dr. Assunta Found for evaluation of treatment for renal mass  Provided patient with education related to Peritoneal dialysis, mailed printed educational materials related to Peritoneal dialysis  Last practice recorded BP readings:  BP Readings from Last 3 Encounters:  10/09/22 129/88  10/01/22 122/70  09/27/22 127/72   Most recent eGFR/CrCl:  Lab Results  Component Value Date   EGFR 38 (L) 10/09/2022    No components found for: "CRCL"     Interventions Today    Flowsheet Row Most Recent Value  Chronic Disease   Chronic disease during today's visit Chronic Kidney Disease/End Stage Renal Disease (ESRD)  General Interventions   General Interventions Discussed/Reviewed General Interventions Discussed, General Interventions Reviewed, Doctor Visits  Doctor Visits Discussed/Reviewed Doctor Visits Discussed, Doctor Visits Reviewed, Specialist  Education Interventions    Education Provided Provided Education  Provided Verbal Education On When to see the doctor, Medication, Other  [Peritoneal dialysis]  Pharmacy Interventions   Pharmacy Dicussed/Reviewed Pharmacy Topics Discussed, Pharmacy Topics Reviewed, Affording Medications          SDOH assessments and interventions completed:  No     Care Coordination Interventions:  Yes, provided   Follow up plan: Follow up call scheduled for 11/16/22 @2 :00 PM    Encounter Outcome:  Patient Visit Completed

## 2022-11-01 NOTE — Patient Instructions (Signed)
Visit Information  Thank you for taking time to visit with me today. Please don't hesitate to contact me if I can be of assistance to you.   Following are the goals we discussed today:   Goals Addressed             This Visit's Progress    To maintain and or improve CKD   On track    Care Coordination Interventions: Assessed patient understanding of chronic kidney disease and potential complications including Anemia  Evaluation of current treatment plan related to chronic kidney disease self management and patient's adherence to plan as established by provider      Engage patient in early, proactive and ongoing discussion about goals of care and what matters most to them    Reviewed and discussed with patient his upcoming scheduled visit for initial consultation at [facility] with Dr. Assunta Found for evaluation of treatment for renal mass  Provided patient with education related to Peritoneal dialysis, mailed printed educational materials related to Peritoneal dialysis  Last practice recorded BP readings:  BP Readings from Last 3 Encounters:  10/09/22 129/88  10/01/22 122/70  09/27/22 127/72   Most recent eGFR/CrCl:  Lab Results  Component Value Date   EGFR 38 (L) 10/09/2022    No components found for: "CRCL"         Our next appointment is by telephone on 11/16/22 at 2:00 PM  Please call the care guide team at [phone] if you need to cancel or reschedule your appointment.   If you are experiencing a Mental Health or Behavioral Health Crisis or need someone to talk to, please call 1-800-273-TALK (toll free, 24 hour hotline)  Patient verbalizes understanding of instructions and care plan provided today and agrees to view in MyChart. Active MyChart status and patient understanding of how to access instructions and care plan via MyChart confirmed with patient.     Delsa Sale RN BSN CCM [place]  [facility], [facility] Nurse Care Coordinator   Direct Dial: [phone] Website: [email or web address]

## 2022-11-06 ENCOUNTER — Other Ambulatory Visit: Payer: Medicare PPO | Admitting: Pharmacist

## 2022-11-06 DIAGNOSIS — M21541 Acquired clubfoot, right foot: Secondary | ICD-10-CM | POA: Diagnosis not present

## 2022-11-06 DIAGNOSIS — M79671 Pain in right foot: Secondary | ICD-10-CM | POA: Diagnosis not present

## 2022-11-06 DIAGNOSIS — E1140 Type 2 diabetes mellitus with diabetic neuropathy, unspecified: Secondary | ICD-10-CM | POA: Diagnosis not present

## 2022-11-06 DIAGNOSIS — M2042 Other hammer toe(s) (acquired), left foot: Secondary | ICD-10-CM | POA: Diagnosis not present

## 2022-11-06 DIAGNOSIS — M2041 Other hammer toe(s) (acquired), right foot: Secondary | ICD-10-CM | POA: Diagnosis not present

## 2022-11-06 DIAGNOSIS — L600 Ingrowing nail: Secondary | ICD-10-CM | POA: Diagnosis not present

## 2022-11-06 DIAGNOSIS — M79672 Pain in left foot: Secondary | ICD-10-CM | POA: Diagnosis not present

## 2022-11-06 DIAGNOSIS — I83893 Varicose veins of bilateral lower extremities with other complications: Secondary | ICD-10-CM | POA: Diagnosis not present

## 2022-11-06 DIAGNOSIS — B351 Tinea unguium: Secondary | ICD-10-CM | POA: Diagnosis not present

## 2022-11-06 NOTE — Progress Notes (Signed)
11/06/2022 Name: [name] MRN: [id number] DOB: [birth year]   Jesse [name] is a 72 y.o. year old male who presented for a telephone visit.   They were referred to the pharmacist by their Case Management Team  for assistance in managing medication - cost concerns.   Patient reports he will be attending a consult appointment at 1pm 11/07/22 with [facility] to see about surgical candidacy for a malignant mass on kidney. He anticipates dialysis afterward, pending further guidance and decisions.  Subjective:  Care Team: Primary Care Provider: Caesar Bookman, NP   Medication Access/Adherence  Current Pharmacy:  [facility] [phone] [place], [place] - [address] AT [facility] OF [address] & [facility] [address] [place] [address] Phone: [phone] Fax: [phone]  [facility] [address] [place] [address] Phone: [phone] Fax: [phone]  [facility] [place] (New Address) - [place], [place] - [address] [facility] AT Previously: [place], [place] [address] [facility] [address] [place] [address] Phone: [phone] Fax: [phone]   Patient reports affordability concerns with their medications: Yes - eliquis $40, lantus, victoza, jardiance $80 for 90DS  Patient reports access/transportation concerns to their pharmacy: No , uses [place] access for travel to appointments (will not deliver controlled or refrigerated medications - sends a family member for those).  mail order for lantus, victoza, insulin.  Patient reports adherence concerns with their medications:  No     Diabetes:  Current medications: lantus 46 units daily, glipizide 5mg  daily, victoza 1.8mg  daily, pending an omnipod pump & novolog setup successfully.  Managed by endocrine, can discuss details of BG control in future visits if desired  BG 130-160s fasting, 250s  postprandial (improved because prior elevations were 350-400s)  Current medication access support: none at present   Objective:  Lab Results  Component Value Date   HGBA1C 10.1 (H) 09/24/2022    Lab Results  Component Value Date   CREATININE 1.85 (H) 10/09/2022   BUN 31 (H) 10/09/2022   NA 138 10/09/2022   K 3.9 10/09/2022   CL 102 10/09/2022   CO2 25 10/09/2022    Lab Results  Component Value Date   CHOL 152 10/09/2022   HDL 50 10/09/2022   LDLCALC 75 10/09/2022   TRIG 168 (H) 10/09/2022   CHOLHDL 3.0 10/09/2022    Medications Reviewed Today     Reviewed by Gabriel Carina, RPH (Pharmacist) on 11/06/22 at 1442  Med List Status: <None>   Medication Order Taking? Sig Documenting Provider Last Dose Status Informant  Accu-Chek Softclix Lancets lancets [id number]  USE TO TEST BLOOD GLUCOSE ONCE DAILY AS DIRECTED Ngetich, [name] C, NP  Active Self, Pharmacy Records  acetaminophen (TYLENOL) 325 MG tablet [id number]  Take 2 tablets (650 mg total) by mouth every 6 (six) hours as needed for mild pain or moderate pain. Leroy Sea, MD  Active Self, Pharmacy Records  alfuzosin (UROXATRAL) 10 MG 24 hr tablet [id number]  TAKE 1 TABLET BY MOUTH EVERY DAY Ngetich, [name], NP  Active Self, Pharmacy Records  ALPRAZolam Prudy Feeler) 0.5 MG tablet [id number]  TAKE 1 TABLET(0.5 MG) BY MOUTH TWICE DAILY AS NEEDED FOR ANXIETY OR SLEEP Ngetich, [name] C, NP  Active   apixaban (ELIQUIS) 5 MG TABS tablet [id number]  Take 1 tablet (5 mg total) by mouth 2 (two) times daily. Eustace Pen, PA-C  Active Self, Pharmacy Records  calcium carbonate (TUMS - DOSED IN MG ELEMENTAL CALCIUM) 500 MG chewable tablet [id number]  Chew 2 tablets by mouth every 8 (eight) hours as needed for indigestion or heartburn. [provider]  Active Self, Pharmacy Records  Continuous Glucose Sensor (DEXCOM G7 SENSOR) [place] [id number]  1 Device by Does not apply route as directed. Shamleffer, Konrad Dolores, MD  Active    cyanocobalamin 1000 MCG tablet [id number]  Take 1,000 mcg by mouth daily. [provider]  Active Self, Pharmacy Records  diclofenac Sodium (VOLTAREN) 1 % GEL [id number]  Apply 2 g topically 4 (four) times daily. Apply to your right neck and shoulder area 3-4 times a day as needed  Patient taking differently: Apply 2 g topically 4 (four) times daily as needed (pain). Apply to your right neck and shoulder area 3-4 times a day as needed   Ngetich, [name], NP  Active Self, Pharmacy Records  DULoxetine (CYMBALTA) 60 MG capsule [id number]  TAKE 1 CAPSULE(60 MG) BY MOUTH DAILY Ngetich, [name] C, NP  Active Self, Pharmacy Records  empagliflozin (JARDIANCE) 25 MG TABS tablet [id number]  Take 1 tablet (25 mg total) by mouth daily. Shamleffer, Konrad Dolores, MD  Active   furosemide (LASIX) 80 MG tablet [id number]  Take 80 mg by mouth 2 (two) times daily. [provider]  Active   glipiZIDE (GLUCOTROL) 5 MG tablet [id number]  Take 1 tablet (5 mg total) by mouth daily before breakfast. Shamleffer, Konrad Dolores, MD  Active   glucose blood (ACCU-CHEK GUIDE) test strip [id number]  USE TO TEST BLOOD SUGAR ONCE DAILY Ngetich, [name] C, NP  Active Self, Pharmacy Records  insulin aspart (NOVOLOG) 100 UNIT/ML injection [id number]  Max daily 60 units Shamleffer, Konrad Dolores, MD  Active   Insulin Disposable Pump (OMNIPOD 5 G7 INTRO, GEN 5,) KIT [id number]  1 Device by Does not apply route every other day. Shamleffer, Konrad Dolores, MD  Active   Insulin Disposable Pump (OMNIPOD 5 G7 PODS, GEN 5,) MISC [id number]  1 Device by Does not apply route every other day. Shamleffer, Konrad Dolores, MD  Active   insulin glargine (LANTUS SOLOSTAR) 100 UNIT/ML Solostar Pen [id number]  Inject 46 Units into the skin daily. Shamleffer, Konrad Dolores, MD  Active   Insulin Pen Needle 31G X 5 MM MISC [id number]  1 Device by Does not apply route daily in the afternoon. Shamleffer, Konrad Dolores, MD  Active   isosorbide  mononitrate (IMDUR) 60 MG 24 hr tablet [id number]  TAKE 1 TABLET(60 MG) BY MOUTH DAILY Ngetich, [name] C, NP  Active   ketoconazole (NIZORAL) 2 % cream [id number]  Apply 1 application topically as needed for irritation. Ngetich, [name], NP  Active Self, Pharmacy Records  Lancet Device [place] [id number]  1 Device by Does not apply route daily. Ngetich, [name], NP  Active Self, Pharmacy Records  liraglutide (VICTOZA) 18 MG/3ML SOPN [id number]  Inject 1.8 mg into the skin daily in the afternoon. Shamleffer, Konrad Dolores, MD  Active   methocarbamol (ROBAXIN) 500 MG tablet [id number]  TAKE 1 TABLET(500 MG) BY MOUTH EVERY 8 HOURS AS NEEDED FOR MUSCLE SPASMS Ngetich, [name] C, NP  Active   metolazone (ZAROXOLYN) 2.5 MG tablet [id number]  Take 2.5 mg by mouth daily. Take 1-2 times a week as needed , spacing out 3 days between doses [provider]  Active   ondansetron (ZOFRAN) 4 MG tablet [id number]  TAKE 1 TABLET(4 MG) BY MOUTH EVERY 8 HOURS AS NEEDED FOR NAUSEA OR VOMITING Ngetich,  [name], NP  Active   OXYGEN [id number]  Inhale 2 L into the lungs continuous. [provider]  Active Self, Pharmacy Records  pantoprazole (PROTONIX) 40 MG tablet [id number]  TAKE 1 TABLET(40 MG) BY MOUTH TWICE DAILY BEFORE A MEAL Ngetich, [name] C, NP  Active Self, Pharmacy Records  potassium chloride SA (KLOR-CON M) 20 MEQ tablet [id number]  Take 1 tablet (20 mEq total) by mouth 2 (two) times daily. Leroy Sea, MD  Active Self, Pharmacy Records  Red Yeast Rice 600 MG CAPS [id number]  Take 1,200 mg by mouth daily. [provider]  Active Self, Pharmacy Records  rOPINIRole (REQUIP) 2 MG tablet [id number]  TAKE 1 TABLET(2 MG) BY MOUTH AT BEDTIME Ngetich, [name] C, NP  Active Self, Pharmacy Records  rosuvastatin (CRESTOR) 10 MG tablet [id number]  TAKE 1 TABLET BY MOUTH EVERY DAY  Patient taking differently: Take 10 mg by mouth daily with supper.   Ngetich, [name], NP  Active Self, Pharmacy Records  simethicone  (MYLICON) 80 MG chewable tablet [id number]  Chew 80 mg by mouth every 6 (six) hours as needed for flatulence. [provider]  Active Self, Pharmacy Records  tiZANidine (ZANAFLEX) 2 MG tablet [id number]  TAKE 1 TABLET BY MOUTH TWICE DAILY  Patient taking differently: Take 2 mg by mouth at bedtime as needed for muscle spasms.   Ngetich, [name], NP  Active Self, Pharmacy Records  topiramate (TOPAMAX) 25 MG tablet [id number]  TAKE 1 TABLET BY MOUTH EVERY DAY Ngetich, [name] C, NP  Active Self, Pharmacy Records  traMADol (ULTRAM) 50 MG tablet [id number]  TAKE 1 TABLET(50 MG) BY MOUTH TWICE DAILY AS NEEDED Ngetich, [name], NP  Active Self, Pharmacy Records  traZODone (DESYREL) 50 MG tablet [id number]  TAKE 2 TABLETS(100 MG) BY MOUTH AT BEDTIME Ngetich, [name], NP  Active Self, Pharmacy Records  triamcinolone cream (KENALOG) 0.1 % [id number]  Apply 1 application. topically 2 (two) times daily. Apply to affected areas on shin area  Patient taking differently: Apply 1 application  topically 2 (two) times daily as needed (rash).   Ngetich, [name], NP  Active Self, Pharmacy Records  Vitamin D, Ergocalciferol, (DRISDOL) 1.25 MG (50000 UNIT) CAPS capsule [id number]  TAKE 1 CAPSULE BY MOUTH EVERY 7 DAYS Ngetich, [name], NP  Active Self, Pharmacy Records  Med List Note Dianah Field 09/07/21 8546): Cpap 12.5 pressure              Assessment/Plan:   Diabetes: - Currently uncontrolled, though plans in place for medication adjustments, followed by endocrine. Insulin pump transition is going well. - Recommend to continue current regimen  - Previoiusly assessed for medicare LIS/Extra help program, assessed for patient assistance programs, and cost savings through alternative medications within same class. Unfortunately, does not meet income criteria for eligibility.  Will continue to monitor for any savings programs and collaborate with care manager to screen for financial support eligibility  not related to medications.   Follow Up Plan: 1 month  Lynnda Shields, PharmD, BCPS Clinical Pharmacist [facility]

## 2022-11-06 NOTE — Patient Instructions (Signed)
[name] speaking with you today. I am glad to hear insulin pump is going well for you. Best wishes for your upcoming appointments- will touch base in a month.  Take care, Jesse [name], PharmD, BCPS Clinical Pharmacist [facility]

## 2022-11-07 ENCOUNTER — Other Ambulatory Visit (HOSPITAL_COMMUNITY): Payer: Self-pay | Admitting: Internal Medicine

## 2022-11-07 DIAGNOSIS — Z23 Encounter for immunization: Secondary | ICD-10-CM | POA: Diagnosis not present

## 2022-11-07 DIAGNOSIS — N2889 Other specified disorders of kidney and ureter: Secondary | ICD-10-CM | POA: Diagnosis not present

## 2022-11-08 DIAGNOSIS — K56609 Unspecified intestinal obstruction, unspecified as to partial versus complete obstruction: Secondary | ICD-10-CM | POA: Diagnosis not present

## 2022-11-08 DIAGNOSIS — Z933 Colostomy status: Secondary | ICD-10-CM | POA: Diagnosis not present

## 2022-11-08 DIAGNOSIS — Z794 Long term (current) use of insulin: Secondary | ICD-10-CM | POA: Diagnosis not present

## 2022-11-08 DIAGNOSIS — E118 Type 2 diabetes mellitus with unspecified complications: Secondary | ICD-10-CM | POA: Diagnosis not present

## 2022-11-08 DIAGNOSIS — E119 Type 2 diabetes mellitus without complications: Secondary | ICD-10-CM | POA: Diagnosis not present

## 2022-11-09 DIAGNOSIS — E119 Type 2 diabetes mellitus without complications: Secondary | ICD-10-CM | POA: Diagnosis not present

## 2022-11-09 DIAGNOSIS — E1140 Type 2 diabetes mellitus with diabetic neuropathy, unspecified: Secondary | ICD-10-CM | POA: Diagnosis not present

## 2022-11-09 DIAGNOSIS — N2889 Other specified disorders of kidney and ureter: Secondary | ICD-10-CM | POA: Diagnosis not present

## 2022-11-09 DIAGNOSIS — I5032 Chronic diastolic (congestive) heart failure: Secondary | ICD-10-CM | POA: Diagnosis not present

## 2022-11-09 DIAGNOSIS — Z794 Long term (current) use of insulin: Secondary | ICD-10-CM | POA: Diagnosis not present

## 2022-11-09 DIAGNOSIS — I130 Hypertensive heart and chronic kidney disease with heart failure and stage 1 through stage 4 chronic kidney disease, or unspecified chronic kidney disease: Secondary | ICD-10-CM | POA: Diagnosis not present

## 2022-11-09 DIAGNOSIS — E785 Hyperlipidemia, unspecified: Secondary | ICD-10-CM | POA: Diagnosis not present

## 2022-11-09 DIAGNOSIS — E1122 Type 2 diabetes mellitus with diabetic chronic kidney disease: Secondary | ICD-10-CM | POA: Diagnosis not present

## 2022-11-09 DIAGNOSIS — K436 Other and unspecified ventral hernia with obstruction, without gangrene: Secondary | ICD-10-CM | POA: Diagnosis not present

## 2022-11-09 DIAGNOSIS — K56699 Other intestinal obstruction unspecified as to partial versus complete obstruction: Secondary | ICD-10-CM | POA: Diagnosis not present

## 2022-11-09 DIAGNOSIS — E1169 Type 2 diabetes mellitus with other specified complication: Secondary | ICD-10-CM | POA: Diagnosis not present

## 2022-11-09 DIAGNOSIS — K56609 Unspecified intestinal obstruction, unspecified as to partial versus complete obstruction: Secondary | ICD-10-CM | POA: Diagnosis not present

## 2022-11-09 DIAGNOSIS — E118 Type 2 diabetes mellitus with unspecified complications: Secondary | ICD-10-CM | POA: Diagnosis not present

## 2022-11-09 DIAGNOSIS — N184 Chronic kidney disease, stage 4 (severe): Secondary | ICD-10-CM | POA: Diagnosis not present

## 2022-11-09 DIAGNOSIS — Z933 Colostomy status: Secondary | ICD-10-CM | POA: Diagnosis not present

## 2022-11-12 DIAGNOSIS — K436 Other and unspecified ventral hernia with obstruction, without gangrene: Secondary | ICD-10-CM | POA: Diagnosis not present

## 2022-11-12 DIAGNOSIS — N184 Chronic kidney disease, stage 4 (severe): Secondary | ICD-10-CM | POA: Diagnosis not present

## 2022-11-12 DIAGNOSIS — I5032 Chronic diastolic (congestive) heart failure: Secondary | ICD-10-CM | POA: Diagnosis not present

## 2022-11-12 DIAGNOSIS — E1169 Type 2 diabetes mellitus with other specified complication: Secondary | ICD-10-CM | POA: Diagnosis not present

## 2022-11-12 DIAGNOSIS — E785 Hyperlipidemia, unspecified: Secondary | ICD-10-CM | POA: Diagnosis not present

## 2022-11-12 DIAGNOSIS — K56699 Other intestinal obstruction unspecified as to partial versus complete obstruction: Secondary | ICD-10-CM | POA: Diagnosis not present

## 2022-11-12 DIAGNOSIS — E1122 Type 2 diabetes mellitus with diabetic chronic kidney disease: Secondary | ICD-10-CM | POA: Diagnosis not present

## 2022-11-12 DIAGNOSIS — E1140 Type 2 diabetes mellitus with diabetic neuropathy, unspecified: Secondary | ICD-10-CM | POA: Diagnosis not present

## 2022-11-12 DIAGNOSIS — I130 Hypertensive heart and chronic kidney disease with heart failure and stage 1 through stage 4 chronic kidney disease, or unspecified chronic kidney disease: Secondary | ICD-10-CM | POA: Diagnosis not present

## 2022-11-15 DIAGNOSIS — I130 Hypertensive heart and chronic kidney disease with heart failure and stage 1 through stage 4 chronic kidney disease, or unspecified chronic kidney disease: Secondary | ICD-10-CM | POA: Diagnosis not present

## 2022-11-15 DIAGNOSIS — E1122 Type 2 diabetes mellitus with diabetic chronic kidney disease: Secondary | ICD-10-CM | POA: Diagnosis not present

## 2022-11-15 DIAGNOSIS — E1169 Type 2 diabetes mellitus with other specified complication: Secondary | ICD-10-CM | POA: Diagnosis not present

## 2022-11-15 DIAGNOSIS — K436 Other and unspecified ventral hernia with obstruction, without gangrene: Secondary | ICD-10-CM | POA: Diagnosis not present

## 2022-11-15 DIAGNOSIS — I5032 Chronic diastolic (congestive) heart failure: Secondary | ICD-10-CM | POA: Diagnosis not present

## 2022-11-15 DIAGNOSIS — E1140 Type 2 diabetes mellitus with diabetic neuropathy, unspecified: Secondary | ICD-10-CM | POA: Diagnosis not present

## 2022-11-15 DIAGNOSIS — E785 Hyperlipidemia, unspecified: Secondary | ICD-10-CM | POA: Diagnosis not present

## 2022-11-15 DIAGNOSIS — N184 Chronic kidney disease, stage 4 (severe): Secondary | ICD-10-CM | POA: Diagnosis not present

## 2022-11-15 DIAGNOSIS — K56699 Other intestinal obstruction unspecified as to partial versus complete obstruction: Secondary | ICD-10-CM | POA: Diagnosis not present

## 2022-11-16 ENCOUNTER — Ambulatory Visit: Payer: Self-pay

## 2022-11-18 ENCOUNTER — Other Ambulatory Visit: Payer: Self-pay | Admitting: Family

## 2022-11-19 ENCOUNTER — Other Ambulatory Visit: Payer: Self-pay | Admitting: Family

## 2022-11-19 ENCOUNTER — Encounter: Payer: Self-pay | Admitting: Internal Medicine

## 2022-11-19 NOTE — Patient Outreach (Signed)
Care Coordination   Follow Up Visit Note   11/16/2022 Name: Jesse [name] MRN: [id number] DOB: 30-Apr-1950  Jesse [name] is a 72 y.o. year old male who sees Ngetich, [name], NP for primary care. I spoke with  [name] by phone today.  What matters to the patients health and wellness today?  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     Goals Addressed             This Visit's Progress    To undergo left CT-guided cryoablation for renal cell carcinoma       Care Coordination Interventions: Evaluation of current treatment plan related to left renal cell carcinoma and patient's adherence to plan as established by provider Reviewed and discussed with patient his initial follow up with Dr. Assunta Found MD, Interventional Radiologist at [facility] for evaluation of left renal cell carcinoma Review of patient status, including review of consultant's reports, relevant laboratory and other test results, and medications completed Reviewed and discussed upcoming scheduled CT-guided cryoablation scheduled for 12/11/22 for removal of the renal cell carcinoma per Dr. Assunta Found Determined patient received a list of pre-operative instructions and is able to repeat back the instructions provided, his daughter will accompanying him to this appointment and will be available to assist afterwards until patient recovers   Reviewed and discussed his post op follow up with local Nephrologist, Dr. Ronalee Belts is scheduled for 12/18/22    Interventions Today    Flowsheet Row Most Recent Value  Chronic Disease   Chronic disease during today's visit Other  [renal cell carcinoma]  General Interventions   General Interventions Discussed/Reviewed General Interventions Discussed, General Interventions Reviewed, Doctor Visits  Doctor Visits Discussed/Reviewed Doctor Visits Discussed, Doctor  Visits Reviewed, Specialist  Education Interventions   Education Provided Provided Education  Provided Verbal Education On When to see the doctor  Pharmacy Interventions   Pharmacy Dicussed/Reviewed Pharmacy Topics Discussed, Pharmacy Topics Reviewed, Medications and their functions          SDOH assessments and interventions completed:  No     Care Coordination Interventions:  Yes, provided   Follow up plan: Follow up call scheduled for 12/21/22 @2 :30 PM    Encounter Outcome:  Patient Visit Completed

## 2022-11-19 NOTE — Telephone Encounter (Signed)
High Risk Warning Populated when attempting to refill, I will send to Provider for further review 

## 2022-11-19 NOTE — Patient Instructions (Signed)
Visit Information  Thank you for taking time to visit with me today. Please don't hesitate to contact me if I can be of assistance to you.   Following are the goals we discussed today:   Goals Addressed             This Visit's Progress    To undergo left CT-guided cryoablation for renal cell carcinoma       Care Coordination Interventions: Evaluation of current treatment plan related to left renal cell carcinoma and patient's adherence to plan as established by provider Reviewed and discussed with patient his initial follow up with Dr. Assunta Found MD, Interventional Radiologist at [facility] for evaluation of left renal cell carcinoma Review of patient status, including review of consultant's reports, relevant laboratory and other test results, and medications completed Reviewed and discussed upcoming scheduled CT-guided cryoablation scheduled for 12/11/22 for removal of the renal cell carcinoma per Dr. Assunta Found Determined patient received a list of pre-operative instructions and is able to repeat back the instructions provided, his daughter will accompanying him to this appointment and will be available to assist afterwards until patient recovers   Reviewed and discussed his post op follow up with local Nephrologist, Dr. Ronalee Belts is scheduled for 12/18/22        Our next appointment is by telephone on 12/21/22 at 2:30 PM   Please call the care guide team at [phone] if you need to cancel or reschedule your appointment.   If you are experiencing a Mental Health or Behavioral Health Crisis or need someone to talk to, please call 1-800-273-TALK (toll free, 24 hour hotline)  Patient verbalizes understanding of instructions and care plan provided today and agrees to view in MyChart. Active MyChart status and patient understanding of how to access instructions and care plan via MyChart confirmed with patient.     Delsa Sale RN BSN CCM [facility], [facility] Nurse Care Coordinator  Direct Dial: [phone] Website: [email or web address]

## 2022-11-21 DIAGNOSIS — N184 Chronic kidney disease, stage 4 (severe): Secondary | ICD-10-CM | POA: Diagnosis not present

## 2022-11-21 DIAGNOSIS — K56699 Other intestinal obstruction unspecified as to partial versus complete obstruction: Secondary | ICD-10-CM | POA: Diagnosis not present

## 2022-11-21 DIAGNOSIS — E1140 Type 2 diabetes mellitus with diabetic neuropathy, unspecified: Secondary | ICD-10-CM | POA: Diagnosis not present

## 2022-11-21 DIAGNOSIS — E1122 Type 2 diabetes mellitus with diabetic chronic kidney disease: Secondary | ICD-10-CM | POA: Diagnosis not present

## 2022-11-21 DIAGNOSIS — E1169 Type 2 diabetes mellitus with other specified complication: Secondary | ICD-10-CM | POA: Diagnosis not present

## 2022-11-21 DIAGNOSIS — E785 Hyperlipidemia, unspecified: Secondary | ICD-10-CM | POA: Diagnosis not present

## 2022-11-21 DIAGNOSIS — I130 Hypertensive heart and chronic kidney disease with heart failure and stage 1 through stage 4 chronic kidney disease, or unspecified chronic kidney disease: Secondary | ICD-10-CM | POA: Diagnosis not present

## 2022-11-21 DIAGNOSIS — I5032 Chronic diastolic (congestive) heart failure: Secondary | ICD-10-CM | POA: Diagnosis not present

## 2022-11-21 DIAGNOSIS — K436 Other and unspecified ventral hernia with obstruction, without gangrene: Secondary | ICD-10-CM | POA: Diagnosis not present

## 2022-11-22 ENCOUNTER — Other Ambulatory Visit: Payer: Self-pay | Admitting: Family

## 2022-11-22 DIAGNOSIS — B379 Candidiasis, unspecified: Secondary | ICD-10-CM

## 2022-11-22 NOTE — Telephone Encounter (Signed)
Nystatin powder is not on patients active medication list

## 2022-11-25 DIAGNOSIS — G4733 Obstructive sleep apnea (adult) (pediatric): Secondary | ICD-10-CM | POA: Diagnosis not present

## 2022-11-26 ENCOUNTER — Encounter: Payer: Self-pay | Admitting: Internal Medicine

## 2022-11-26 DIAGNOSIS — E119 Type 2 diabetes mellitus without complications: Secondary | ICD-10-CM | POA: Diagnosis not present

## 2022-11-26 DIAGNOSIS — E118 Type 2 diabetes mellitus with unspecified complications: Secondary | ICD-10-CM | POA: Diagnosis not present

## 2022-11-26 DIAGNOSIS — Z933 Colostomy status: Secondary | ICD-10-CM | POA: Diagnosis not present

## 2022-11-26 DIAGNOSIS — K56609 Unspecified intestinal obstruction, unspecified as to partial versus complete obstruction: Secondary | ICD-10-CM | POA: Diagnosis not present

## 2022-11-26 DIAGNOSIS — Z794 Long term (current) use of insulin: Secondary | ICD-10-CM | POA: Diagnosis not present

## 2022-11-27 ENCOUNTER — Other Ambulatory Visit: Payer: Self-pay | Admitting: Family

## 2022-11-27 ENCOUNTER — Other Ambulatory Visit: Payer: Self-pay

## 2022-11-27 ENCOUNTER — Other Ambulatory Visit: Payer: Self-pay | Admitting: Internal Medicine

## 2022-11-27 DIAGNOSIS — Z933 Colostomy status: Secondary | ICD-10-CM | POA: Diagnosis not present

## 2022-11-27 DIAGNOSIS — E118 Type 2 diabetes mellitus with unspecified complications: Secondary | ICD-10-CM | POA: Diagnosis not present

## 2022-11-27 DIAGNOSIS — Z794 Long term (current) use of insulin: Secondary | ICD-10-CM | POA: Diagnosis not present

## 2022-11-27 DIAGNOSIS — K56609 Unspecified intestinal obstruction, unspecified as to partial versus complete obstruction: Secondary | ICD-10-CM | POA: Diagnosis not present

## 2022-11-27 DIAGNOSIS — I5022 Chronic systolic (congestive) heart failure: Secondary | ICD-10-CM

## 2022-11-27 DIAGNOSIS — E119 Type 2 diabetes mellitus without complications: Secondary | ICD-10-CM | POA: Diagnosis not present

## 2022-11-27 MED ORDER — INSULIN ASPART 100 UNIT/ML IJ SOLN: INTRAMUSCULAR | 3 refills | Status: DC

## 2022-11-27 MED ORDER — METOLAZONE 2.5 MG PO TABS: 2.5000 mg | ORAL_TABLET | Freq: Every day | ORAL | 1 refills | Status: DC

## 2022-11-27 NOTE — Progress Notes (Signed)
Metolazone prescription send to pharmacy as requested.

## 2022-11-29 DIAGNOSIS — E119 Type 2 diabetes mellitus without complications: Secondary | ICD-10-CM | POA: Diagnosis not present

## 2022-11-29 DIAGNOSIS — Z794 Long term (current) use of insulin: Secondary | ICD-10-CM | POA: Diagnosis not present

## 2022-11-29 DIAGNOSIS — E118 Type 2 diabetes mellitus with unspecified complications: Secondary | ICD-10-CM | POA: Diagnosis not present

## 2022-11-29 DIAGNOSIS — K56609 Unspecified intestinal obstruction, unspecified as to partial versus complete obstruction: Secondary | ICD-10-CM | POA: Diagnosis not present

## 2022-11-29 DIAGNOSIS — Z933 Colostomy status: Secondary | ICD-10-CM | POA: Diagnosis not present

## 2022-12-06 ENCOUNTER — Other Ambulatory Visit: Payer: Self-pay | Admitting: Pharmacist

## 2022-12-06 NOTE — Progress Notes (Signed)
12/07/2022 Name: Jesse [name] MRN: [id number] DOB: [birth year]   Jesse [name] is a 72 y.o. year old male who presented for a telephone visit.   They were referred to the pharmacist by their Case Management Team  for assistance in managing medication - cost concerns.   Patient reports he will be undergoing a short stay surgery for malignant mass on kidney next Tuesday, 10/22. Pending this recovery and outcome, this may be a way to delay the need for dialysis afterward. He is hopeful and follows closely as an active participant in his overall health.   Subjective:  Care Team: Primary Care Provider: Caesar Bookman, NP   Medication Access/Adherence  Current Pharmacy:  [facility] [id number] [place], [place] - [address] AT [facility] [address] [facility] [address] [place] [address] Phone: [phone] Fax: [phone]  [facility] [address] [place] [address] Phone: [phone] Fax: [phone]  [facility] [place] (New Address) - [place], [place] - [address] [facility] AT Previously: [place], [place] [address] [facility] [address] 4th Floor [address] [place] [address] Phone: [phone] Fax: [phone]    Diabetes:  Current medications: glipizide 5mg  daily, victoza 1.8mg  daily, pending an omnipod pump & novolog setup successfully.  Managed by endocrine, can discuss details of BG control in future visits if desired  BG 130-160s fasting, 250s postprandial (improved because prior elevations were 350-400s)  Current medication access support: none at present   Objective:  Lab Results  Component Value Date   HGBA1C 10.1 (H) 09/24/2022    Lab Results  Component Value Date   CREATININE 1.85 (H) 10/09/2022   BUN 31 (H) 10/09/2022   NA 138 10/09/2022   K 3.9 10/09/2022   CL 102 10/09/2022   CO2 25 10/09/2022    Lab Results  Component Value Date    CHOL 152 10/09/2022   HDL 50 10/09/2022   LDLCALC 75 10/09/2022   TRIG 168 (H) 10/09/2022   CHOLHDL 3.0 10/09/2022    Medications Reviewed Today     Reviewed by Gabriel Carina, RPH (Pharmacist) on 12/07/22 at 1006  Med List Status: <None>   Medication Order Taking? Sig Documenting Provider Last Dose Status Informant  Accu-Chek Softclix Lancets lancets [id number] No USE TO TEST BLOOD GLUCOSE ONCE DAILY AS DIRECTED Ngetich, [name] C, NP Taking Active Self, Pharmacy Records  acetaminophen (TYLENOL) 325 MG tablet [id number] No Take 2 tablets (650 mg total) by mouth every 6 (six) hours as needed for mild pain or moderate pain. Leroy Sea, MD Taking Active Self, Pharmacy Records  alfuzosin (UROXATRAL) 10 MG 24 hr tablet [id number] No TAKE 1 TABLET BY MOUTH EVERY DAY Ngetich, [name], NP Taking Active Self, Pharmacy Records  ALPRAZolam Prudy Feeler) 0.5 MG tablet [id number] No TAKE 1 TABLET(0.5 MG) BY MOUTH TWICE DAILY AS NEEDED FOR ANXIETY OR SLEEP Ngetich, [name] C, NP Taking Active   apixaban (ELIQUIS) 5 MG TABS tablet [id number]  TAKE 1 TABLET(5 MG) BY MOUTH TWICE DAILY Eustace Pen, PA-C  Active   calcium carbonate (TUMS - DOSED IN MG ELEMENTAL CALCIUM) 500 MG chewable tablet [id number] No Chew 2 tablets by mouth every 8 (eight) hours as needed for indigestion or heartburn. [provider] Taking Active Self, Pharmacy Records  Continuous Glucose Sensor (DEXCOM G7 SENSOR) MISC [id number] No 1 Device by Does not apply route as directed. Shamleffer, Konrad Dolores, MD Taking Active  cyanocobalamin 1000 MCG tablet [id number] No Take 1,000 mcg by mouth daily. [provider] Taking Active Self, Pharmacy Records  diclofenac Sodium (VOLTAREN) 1 % GEL [id number] No Apply 2 g topically 4 (four) times daily. Apply to your right neck and shoulder area 3-4 times a day as needed  Patient taking differently: Apply 2 g topically 4 (four) times daily as needed (pain). Apply to your right neck and  shoulder area 3-4 times a day as needed   Ngetich, [name], NP Taking Active Self, Pharmacy Records  DULoxetine (CYMBALTA) 60 MG capsule [id number]  TAKE 1 CAPSULE(60 MG) BY MOUTH DAILY Ngetich, [name] C, NP  Active   empagliflozin (JARDIANCE) 25 MG TABS tablet [id number] No Take 1 tablet (25 mg total) by mouth daily. Shamleffer, Konrad Dolores, MD Taking Active   furosemide (LASIX) 80 MG tablet [id number] No Take 80 mg by mouth 2 (two) times daily. [provider] Taking Active   glipiZIDE (GLUCOTROL) 5 MG tablet [id number] No Take 1 tablet (5 mg total) by mouth daily before breakfast. Shamleffer, Konrad Dolores, MD Taking Active   glucose blood (ACCU-CHEK GUIDE) test strip [id number] No USE TO TEST BLOOD SUGAR ONCE DAILY Ngetich, [name] C, NP Taking Active Self, Pharmacy Records  insulin aspart (NOVOLOG) 100 UNIT/ML injection [id number]  Max daily 100 units Shamleffer, Konrad Dolores, MD  Active   Insulin Disposable Pump (OMNIPOD 5 G7 INTRO, GEN 5,) KIT [id number] No 1 Device by Does not apply route every other day. Shamleffer, Konrad Dolores, MD Taking Active   Insulin Disposable Pump (OMNIPOD 5 G7 PODS, GEN 5,) MISC [id number] No 1 Device by Does not apply route every other day. Shamleffer, Konrad Dolores, MD Taking Active   Insulin Pen Needle 31G X 5 MM MISC [id number] No 1 Device by Does not apply route daily in the afternoon. Shamleffer, Konrad Dolores, MD Taking Active   isosorbide mononitrate (IMDUR) 60 MG 24 hr tablet [id number] No TAKE 1 TABLET(60 MG) BY MOUTH DAILY Ngetich, [name] C, NP Taking Active   ketoconazole (NIZORAL) 2 % cream [id number]  APPLY TOPICALLY TO THE AFFECTED AREA AS NEEDED FOR IRRITATION Ngetich, [name], NP  Active   Lancet Device MISC [id number] No 1 Device by Does not apply route daily. Ngetich, [name], NP Taking Active Self, Pharmacy Records  liraglutide (VICTOZA) 18 MG/3ML SOPN [id number] No Inject 1.8 mg into the skin daily in the afternoon. Shamleffer, Konrad Dolores, MD Taking Active   methocarbamol (ROBAXIN) 500 MG tablet [id number] No TAKE 1 TABLET(500 MG) BY MOUTH EVERY 8 HOURS AS NEEDED FOR MUSCLE SPASMS Ngetich, [name] C, NP Taking Active   metolazone (ZAROXOLYN) 2.5 MG tablet [id number]  Take 1 tablet (2.5 mg total) by mouth daily. Take 1-2 times a week as needed , spacing out 3 days between doses Ngetich, [name] C, NP  Active   ondansetron (ZOFRAN) 4 MG tablet [id number] No TAKE 1 TABLET(4 MG) BY MOUTH EVERY 8 HOURS AS NEEDED FOR NAUSEA OR VOMITING Ngetich, [name] C, NP Taking Active   OXYGEN [id number] No Inhale 2 L into the lungs continuous. [provider] Taking Active Self, Pharmacy Records  pantoprazole (PROTONIX) 40 MG tablet [id number] No TAKE 1 TABLET(40 MG) BY MOUTH TWICE DAILY BEFORE A MEAL Ngetich, [name] C, NP Taking Active Self, Pharmacy Records  potassium chloride SA (KLOR-CON M) 20 MEQ tablet [id number] No Take 1 tablet (20 mEq total) by mouth 2 (two) times daily. Leroy Sea, MD Taking Active Self, Pharmacy Records  Red Yeast  Rice 600 MG CAPS [id number] No Take 1,200 mg by mouth daily. [provider] Taking Active Self, Pharmacy Records  rOPINIRole (REQUIP) 2 MG tablet [id number] No TAKE 1 TABLET(2 MG) BY MOUTH AT BEDTIME Ngetich, [name] C, NP Taking Active Self, Pharmacy Records  rosuvastatin (CRESTOR) 10 MG tablet [id number] No TAKE 1 TABLET BY MOUTH EVERY DAY  Patient taking differently: Take 10 mg by mouth daily with supper.   Ngetich, [name], NP Taking Active Self, Pharmacy Records  simethicone (MYLICON) 80 MG chewable tablet [id number] No Chew 80 mg by mouth every 6 (six) hours as needed for flatulence. [provider] Taking Active Self, Pharmacy Records  tiZANidine (ZANAFLEX) 2 MG tablet [id number] No TAKE 1 TABLET BY MOUTH TWICE DAILY  Patient taking differently: Take 2 mg by mouth at bedtime as needed for muscle spasms.   Ngetich, [name], NP Taking Active Self, Pharmacy Records  topiramate (TOPAMAX) 25  MG tablet [id number] No TAKE 1 TABLET BY MOUTH EVERY DAY Ngetich, [name] C, NP Taking Active Self, Pharmacy Records  traMADol (ULTRAM) 50 MG tablet [id number] No TAKE 1 TABLET(50 MG) BY MOUTH TWICE DAILY AS NEEDED Ngetich, [name] C, NP Taking Active Self, Pharmacy Records  traZODone (DESYREL) 50 MG tablet [id number]  TAKE 2 TABLETS(100 MG) BY MOUTH AT BEDTIME Ngetich, [name] C, NP  Active   triamcinolone cream (KENALOG) 0.1 % [id number] No Apply 1 application. topically 2 (two) times daily. Apply to affected areas on shin area  Patient taking differently: Apply 1 application  topically 2 (two) times daily as needed (rash).   Ngetich, [name], NP Taking Active Self, Pharmacy Records  Vitamin D, Ergocalciferol, (DRISDOL) 1.25 MG (50000 UNIT) CAPS capsule [id number] No TAKE 1 CAPSULE BY MOUTH EVERY 7 DAYS Ngetich, [name], NP Taking Active Self, Pharmacy Records  Med List Note Dianah Field 09/07/21 1761): Cpap 12.5 pressure             Assessment/Plan:   Diabetes: - Currently uncontrolled, though he is undergoing adjustments to insulin pump, managed by endocrine - Recommend to continue current regimen  - Previoiusly assessed for medicare LIS/Extra help program, assessed for patient assistance programs, and cost savings through alternative medications within same class. Unfortunately, does not meet income criteria for eligibility.  Will continue to monitor for any savings programs and collaborate with care manager to screen for financial support eligibility not related to medications.   Follow Up Plan: no follow up scheduled at this time, it was a pleasure to work with patient. I recommend ongoing/routine care with PCP, and can engage pharmacy services at a later time if future needs arise.   Lynnda Shields, PharmD, BCPS Clinical Pharmacist [facility]

## 2022-12-07 NOTE — Patient Instructions (Signed)
Mr. Secrease,  Thank you so much for speaking with me today - it was a pleasure working with you. I recommend continuing your routine care with [name], and if future needs arise, you can request her to submit a new referral to connect with a [facility] pharmacist.  I wish you the best next Tuesday and on the journey ahead!  Take care, Elmarie Shiley, PharmD, BCPS Clinical Pharmacist [facility]

## 2022-12-09 ENCOUNTER — Other Ambulatory Visit: Payer: Self-pay | Admitting: Family

## 2022-12-11 DIAGNOSIS — C642 Malignant neoplasm of left kidney, except renal pelvis: Secondary | ICD-10-CM | POA: Diagnosis not present

## 2022-12-11 DIAGNOSIS — N2889 Other specified disorders of kidney and ureter: Secondary | ICD-10-CM | POA: Diagnosis not present

## 2022-12-11 HISTORY — PX: RENAL CRYOABLATION: SHX2322

## 2022-12-14 ENCOUNTER — Ambulatory Visit (INDEPENDENT_AMBULATORY_CARE_PROVIDER_SITE_OTHER): Payer: Medicare PPO

## 2022-12-14 DIAGNOSIS — I442 Atrioventricular block, complete: Secondary | ICD-10-CM | POA: Diagnosis not present

## 2022-12-17 ENCOUNTER — Other Ambulatory Visit: Payer: Self-pay | Admitting: Family

## 2022-12-17 DIAGNOSIS — Z433 Encounter for attention to colostomy: Secondary | ICD-10-CM

## 2022-12-17 LAB — CUP PACEART REMOTE DEVICE CHECK
Battery Remaining Longevity: 84 mo
Battery Remaining Percentage: 87 %
Battery Voltage: 2.99 V
Brady Statistic AP VP Percent: 52 %
Brady Statistic AP VS Percent: 1 %
Brady Statistic AS VP Percent: 48 %
Brady Statistic AS VS Percent: 1 %
Brady Statistic RA Percent Paced: 50 %
Brady Statistic RV Percent Paced: 99 %
Date Time Interrogation Session: 20241025050740
Implantable Lead Connection Status: 753985
Implantable Lead Connection Status: 753985
Implantable Lead Implant Date: 20230728
Implantable Lead Implant Date: 20230728
Implantable Lead Location: 753859
Implantable Lead Location: 753860
Implantable Lead Model: 3830
Implantable Pulse Generator Implant Date: 20230728
Lead Channel Impedance Value: 410 Ohm
Lead Channel Impedance Value: 530 Ohm
Lead Channel Pacing Threshold Amplitude: 0.625 V
Lead Channel Pacing Threshold Amplitude: 1 V
Lead Channel Pacing Threshold Pulse Width: 0.5 ms
Lead Channel Pacing Threshold Pulse Width: 0.5 ms
Lead Channel Sensing Intrinsic Amplitude: 12 mV
Lead Channel Sensing Intrinsic Amplitude: 2.4 mV
Lead Channel Setting Pacing Amplitude: 1.625
Lead Channel Setting Pacing Amplitude: 2.5 V
Lead Channel Setting Pacing Pulse Width: 0.5 ms
Lead Channel Setting Sensing Sensitivity: 2.5 mV
Pulse Gen Model: 2272
Pulse Gen Serial Number: 8101667

## 2022-12-18 DIAGNOSIS — E1122 Type 2 diabetes mellitus with diabetic chronic kidney disease: Secondary | ICD-10-CM | POA: Diagnosis not present

## 2022-12-18 DIAGNOSIS — E876 Hypokalemia: Secondary | ICD-10-CM | POA: Diagnosis not present

## 2022-12-18 DIAGNOSIS — N2581 Secondary hyperparathyroidism of renal origin: Secondary | ICD-10-CM | POA: Diagnosis not present

## 2022-12-18 DIAGNOSIS — C642 Malignant neoplasm of left kidney, except renal pelvis: Secondary | ICD-10-CM | POA: Diagnosis not present

## 2022-12-18 DIAGNOSIS — E559 Vitamin D deficiency, unspecified: Secondary | ICD-10-CM | POA: Diagnosis not present

## 2022-12-18 DIAGNOSIS — N1832 Chronic kidney disease, stage 3b: Secondary | ICD-10-CM | POA: Diagnosis not present

## 2022-12-18 DIAGNOSIS — N189 Chronic kidney disease, unspecified: Secondary | ICD-10-CM | POA: Diagnosis not present

## 2022-12-18 DIAGNOSIS — D631 Anemia in chronic kidney disease: Secondary | ICD-10-CM | POA: Diagnosis not present

## 2022-12-18 DIAGNOSIS — I509 Heart failure, unspecified: Secondary | ICD-10-CM | POA: Diagnosis not present

## 2022-12-18 DIAGNOSIS — I130 Hypertensive heart and chronic kidney disease with heart failure and stage 1 through stage 4 chronic kidney disease, or unspecified chronic kidney disease: Secondary | ICD-10-CM | POA: Diagnosis not present

## 2022-12-18 LAB — BASIC METABOLIC PANEL
BUN: 36 — AB (ref 4–21)
CO2: 23 — AB (ref 13–22)
Chloride: 103 (ref 99–108)
Creatinine: 1.7 — AB (ref 0.6–1.3)
Glucose: 180
Potassium: 3.9 meq/L (ref 3.5–5.1)
Sodium: 136 — AB (ref 137–147)

## 2022-12-18 LAB — CBC AND DIFFERENTIAL
HCT: 36 — AB (ref 41–53)
Hemoglobin: 11.4 — AB (ref 13.5–17.5)
Neutrophils Absolute: 6.2
Platelets: 267 10*3/uL (ref 150–400)
WBC: 8.6

## 2022-12-18 LAB — IRON,TIBC AND FERRITIN PANEL
%SAT: 11
Ferritin: 36
Iron: 43
TIBC: 382
UIBC: 339

## 2022-12-18 LAB — CBC: RBC: 4.22 (ref 3.87–5.11)

## 2022-12-18 LAB — COMPREHENSIVE METABOLIC PANEL
Albumin: 4.1 (ref 3.5–5.0)
Calcium: 8.7 (ref 8.7–10.7)
eGFR: 41

## 2022-12-20 DIAGNOSIS — Z483 Aftercare following surgery for neoplasm: Secondary | ICD-10-CM | POA: Diagnosis not present

## 2022-12-20 DIAGNOSIS — I700 Atherosclerosis of aorta: Secondary | ICD-10-CM | POA: Diagnosis not present

## 2022-12-20 DIAGNOSIS — I130 Hypertensive heart and chronic kidney disease with heart failure and stage 1 through stage 4 chronic kidney disease, or unspecified chronic kidney disease: Secondary | ICD-10-CM | POA: Diagnosis not present

## 2022-12-20 DIAGNOSIS — E1122 Type 2 diabetes mellitus with diabetic chronic kidney disease: Secondary | ICD-10-CM | POA: Diagnosis not present

## 2022-12-20 DIAGNOSIS — N183 Chronic kidney disease, stage 3 unspecified: Secondary | ICD-10-CM | POA: Diagnosis not present

## 2022-12-20 DIAGNOSIS — I5022 Chronic systolic (congestive) heart failure: Secondary | ICD-10-CM | POA: Diagnosis not present

## 2022-12-20 DIAGNOSIS — E1142 Type 2 diabetes mellitus with diabetic polyneuropathy: Secondary | ICD-10-CM | POA: Diagnosis not present

## 2022-12-20 DIAGNOSIS — I480 Paroxysmal atrial fibrillation: Secondary | ICD-10-CM | POA: Diagnosis not present

## 2022-12-20 DIAGNOSIS — D4102 Neoplasm of uncertain behavior of left kidney: Secondary | ICD-10-CM | POA: Diagnosis not present

## 2022-12-21 ENCOUNTER — Encounter: Payer: Self-pay | Admitting: Internal Medicine

## 2022-12-21 ENCOUNTER — Ambulatory Visit: Payer: Self-pay

## 2022-12-21 ENCOUNTER — Telehealth: Payer: Self-pay

## 2022-12-21 NOTE — Patient Instructions (Signed)
Visit Information  Thank you for taking time to visit with me today. Please don't hesitate to contact me if I can be of assistance to you.   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 Procedure: Left renal tumor cryoablation and biopsy indicated. Site has not been marked because it's not applicable to this case. This will be completed today. This procedure has been fully reviewed with the patient, and written informed consent has been obtained. 2. Sedation: ASA Grade Assessment: 3 - A patient with severe systemic disease. Moderate sedation is appropriate for this patient at this time.  3. Antibiotics: The patient will require Ceftriaxone to be administered in VIR immediately prior to the procedure.  4. Blood product transfusion: Transfusion not anticipated. 5. Post-procedure care: Outpatient to be discharged from IRU.  Assessed for patient knowledge and understanding related to plan of care and next scheduled CT evaluation scheduled for 6 months Instructed patient to keep his doctor well informed of new symptoms or concerns promptly         Our next appointment is by telephone on 12/27/22 at 2:00 PM  Please call the care guide team at [phone] if you need to cancel or reschedule your appointment.   If you are experiencing a Mental Health or Behavioral Health Crisis or need someone to talk to, please call 1-800-273-TALK (toll free, 24 hour  hotline)  Patient verbalizes understanding of instructions and care plan provided today and agrees to view in MyChart. Active MyChart status and patient understanding of how to access instructions and care plan via MyChart confirmed with patient.     Delsa Sale RN BSN CCM [place]  [facility] Nurse Care Coordinator  Direct Dial: [phone] Website: [email or web address]

## 2022-12-21 NOTE — Telephone Encounter (Signed)
Called pt he states he was 368 pounds now he is 383. Pt states he seen his nephrologist on Tuesday, did not tell him about the weight gain (he did not weigh before the appt). He was okay with pt restarting Metolazone due to swelling. Pt has contacted his PCP, they advised him call us. Pt aware DOD and Dr. Wyline Mood are gone for the night but the message will be sent to his primary cardiologist. The nurse he spoke to today states she will call him to follow up on him restricting his fluids. Pt denies symptom at this time. Pt will monitor his weight over the weekend, restrict fluid and monitor salt intake. Pt will call or send MyChart message Monday with an update.

## 2022-12-21 NOTE — Patient Outreach (Signed)
  Care Coordination   Follow Up Visit Note   12/21/2022 Name: Jesse [name] MRN: [id number] DOB: Nov 21, 1950  Jesse [name] is a 72 y.o. year old male who sees Ngetich, [name], NP for primary care. I spoke with  [name] by phone today.  What matters to the patients health and wellness today?  Patient would like to maintain and or improve his current kidney function.     Goals Addressed             This Visit's Progress    To undergo left CT-guided cryoablation for renal cell carcinoma   On track    Care Coordination Interventions: Evaluation of current treatment plan related to left renal cell carcinoma and patient's adherence to plan as established by provider Determined patient underwent the left renal tumor cryoablation and biopsy Review of patient status, including review of consultant's reports, relevant laboratory and other test results, and medications completed ASSESSMENT & PLAN 1. Procedure: Left renal tumor cryoablation and biopsy indicated. Site has not been marked because it's not applicable to this case. This will be completed today. This procedure has been fully reviewed with the patient, and written informed consent has been obtained. 2. Sedation: ASA Grade Assessment: 3 - A patient with severe systemic disease. Moderate sedation is appropriate for this patient at this time.  3. Antibiotics: The patient will require Ceftriaxone to be administered in VIR immediately prior to the procedure.  4. Blood product transfusion: Transfusion not anticipated. 5. Post-procedure care: Outpatient to be discharged from IRU.  Assessed for patient knowledge and understanding related to plan of care and next scheduled CT evaluation scheduled for 6 months Instructed patient to keep his doctor well informed of new symptoms or concerns promptly     Interventions Today    Flowsheet Row Most Recent Value  Chronic Disease   Chronic disease during today's visit Other,  Congestive Heart Failure (CHF)  [renal carcinoma,  cryoablation]  General Interventions   General Interventions Discussed/Reviewed General Interventions Discussed, General Interventions Reviewed, Doctor Visits, Labs  Doctor Visits Discussed/Reviewed Doctor Visits Discussed, Doctor Visits Reviewed, Specialist  Education Interventions   Education Provided Provided Education  Provided Verbal Education On Medication, When to see the doctor, Labs, Nutrition  Labs Reviewed Kidney Function  Nutrition Interventions   Nutrition Discussed/Reviewed Nutrition Discussed, Nutrition Reviewed, Decreasing salt  Pharmacy Interventions   Pharmacy Dicussed/Reviewed Pharmacy Topics Discussed, Pharmacy Topics Reviewed, Medications and their functions          SDOH assessments and interventions completed:  No     Care Coordination Interventions:  Yes, provided   Follow up plan: Follow up call scheduled for 12/27/22 @2 :00 PM    Encounter Outcome:  Patient Visit Completed

## 2022-12-21 NOTE — Telephone Encounter (Signed)
Called pt. See chart.  

## 2022-12-24 DIAGNOSIS — I130 Hypertensive heart and chronic kidney disease with heart failure and stage 1 through stage 4 chronic kidney disease, or unspecified chronic kidney disease: Secondary | ICD-10-CM | POA: Diagnosis not present

## 2022-12-24 DIAGNOSIS — N183 Chronic kidney disease, stage 3 unspecified: Secondary | ICD-10-CM | POA: Diagnosis not present

## 2022-12-24 DIAGNOSIS — I700 Atherosclerosis of aorta: Secondary | ICD-10-CM | POA: Diagnosis not present

## 2022-12-24 DIAGNOSIS — I5022 Chronic systolic (congestive) heart failure: Secondary | ICD-10-CM | POA: Diagnosis not present

## 2022-12-24 DIAGNOSIS — D4102 Neoplasm of uncertain behavior of left kidney: Secondary | ICD-10-CM | POA: Diagnosis not present

## 2022-12-24 DIAGNOSIS — E1122 Type 2 diabetes mellitus with diabetic chronic kidney disease: Secondary | ICD-10-CM | POA: Diagnosis not present

## 2022-12-24 DIAGNOSIS — I480 Paroxysmal atrial fibrillation: Secondary | ICD-10-CM | POA: Diagnosis not present

## 2022-12-24 DIAGNOSIS — E1142 Type 2 diabetes mellitus with diabetic polyneuropathy: Secondary | ICD-10-CM | POA: Diagnosis not present

## 2022-12-24 DIAGNOSIS — Z483 Aftercare following surgery for neoplasm: Secondary | ICD-10-CM | POA: Diagnosis not present

## 2022-12-24 NOTE — Telephone Encounter (Signed)
Called pt. See chart.  

## 2022-12-24 NOTE — Telephone Encounter (Signed)
Called pt to follow up on symptoms from Friday. He states he send a MyChart message this morning. See below: Weight down to 378 this AM with measures I've taken all weekend.    Will continue.   Asked pt if he wanted to be seen today by a provider. He states "No I didn't want to be seen, I was just ordered to let you all know." Pt aware he can reach out to [place] if he does need to be seen. He verbalized understanding. No further concerns/questions expressed at this time.

## 2022-12-25 DIAGNOSIS — E118 Type 2 diabetes mellitus with unspecified complications: Secondary | ICD-10-CM | POA: Diagnosis not present

## 2022-12-25 DIAGNOSIS — I130 Hypertensive heart and chronic kidney disease with heart failure and stage 1 through stage 4 chronic kidney disease, or unspecified chronic kidney disease: Secondary | ICD-10-CM | POA: Diagnosis not present

## 2022-12-25 DIAGNOSIS — E119 Type 2 diabetes mellitus without complications: Secondary | ICD-10-CM | POA: Diagnosis not present

## 2022-12-25 DIAGNOSIS — N183 Chronic kidney disease, stage 3 unspecified: Secondary | ICD-10-CM | POA: Diagnosis not present

## 2022-12-25 DIAGNOSIS — K56609 Unspecified intestinal obstruction, unspecified as to partial versus complete obstruction: Secondary | ICD-10-CM | POA: Diagnosis not present

## 2022-12-25 DIAGNOSIS — E1122 Type 2 diabetes mellitus with diabetic chronic kidney disease: Secondary | ICD-10-CM | POA: Diagnosis not present

## 2022-12-25 DIAGNOSIS — I480 Paroxysmal atrial fibrillation: Secondary | ICD-10-CM | POA: Diagnosis not present

## 2022-12-25 DIAGNOSIS — Z794 Long term (current) use of insulin: Secondary | ICD-10-CM | POA: Diagnosis not present

## 2022-12-25 DIAGNOSIS — D4102 Neoplasm of uncertain behavior of left kidney: Secondary | ICD-10-CM | POA: Diagnosis not present

## 2022-12-25 DIAGNOSIS — I700 Atherosclerosis of aorta: Secondary | ICD-10-CM | POA: Diagnosis not present

## 2022-12-25 DIAGNOSIS — Z483 Aftercare following surgery for neoplasm: Secondary | ICD-10-CM | POA: Diagnosis not present

## 2022-12-25 DIAGNOSIS — Z933 Colostomy status: Secondary | ICD-10-CM | POA: Diagnosis not present

## 2022-12-25 DIAGNOSIS — E1142 Type 2 diabetes mellitus with diabetic polyneuropathy: Secondary | ICD-10-CM | POA: Diagnosis not present

## 2022-12-25 DIAGNOSIS — I5022 Chronic systolic (congestive) heart failure: Secondary | ICD-10-CM | POA: Diagnosis not present

## 2022-12-26 ENCOUNTER — Ambulatory Visit (HOSPITAL_COMMUNITY)
Admission: RE | Admit: 2022-12-26 | Discharge: 2022-12-26 | Disposition: A | Discharge status: Home / Self Care | Payer: Medicare PPO | Source: Ambulatory Visit | Attending: Internal Medicine | Admitting: Internal Medicine

## 2022-12-26 VITALS — BP 112/60 | HR 81 | Ht 72.0 in | Wt 375.0 lb

## 2022-12-26 DIAGNOSIS — E1122 Type 2 diabetes mellitus with diabetic chronic kidney disease: Secondary | ICD-10-CM | POA: Diagnosis not present

## 2022-12-26 DIAGNOSIS — Z8679 Personal history of other diseases of the circulatory system: Secondary | ICD-10-CM | POA: Insufficient documentation

## 2022-12-26 DIAGNOSIS — Z6841 Body Mass Index (BMI) 40.0 and over, adult: Secondary | ICD-10-CM | POA: Insufficient documentation

## 2022-12-26 DIAGNOSIS — Z7182 Exercise counseling: Secondary | ICD-10-CM | POA: Diagnosis not present

## 2022-12-26 DIAGNOSIS — Z794 Long term (current) use of insulin: Secondary | ICD-10-CM | POA: Diagnosis not present

## 2022-12-26 DIAGNOSIS — Z7901 Long term (current) use of anticoagulants: Secondary | ICD-10-CM | POA: Diagnosis not present

## 2022-12-26 DIAGNOSIS — Z7984 Long term (current) use of oral hypoglycemic drugs: Secondary | ICD-10-CM | POA: Insufficient documentation

## 2022-12-26 DIAGNOSIS — I4819 Other persistent atrial fibrillation: Secondary | ICD-10-CM | POA: Diagnosis not present

## 2022-12-26 DIAGNOSIS — I130 Hypertensive heart and chronic kidney disease with heart failure and stage 1 through stage 4 chronic kidney disease, or unspecified chronic kidney disease: Secondary | ICD-10-CM | POA: Insufficient documentation

## 2022-12-26 DIAGNOSIS — D6869 Other thrombophilia: Secondary | ICD-10-CM | POA: Insufficient documentation

## 2022-12-26 DIAGNOSIS — Z9981 Dependence on supplemental oxygen: Secondary | ICD-10-CM | POA: Diagnosis not present

## 2022-12-26 DIAGNOSIS — I4891 Unspecified atrial fibrillation: Secondary | ICD-10-CM

## 2022-12-26 DIAGNOSIS — Z7985 Long-term (current) use of injectable non-insulin antidiabetic drugs: Secondary | ICD-10-CM | POA: Diagnosis not present

## 2022-12-26 DIAGNOSIS — Z993 Dependence on wheelchair: Secondary | ICD-10-CM | POA: Diagnosis not present

## 2022-12-26 DIAGNOSIS — N1832 Chronic kidney disease, stage 3b: Secondary | ICD-10-CM | POA: Diagnosis not present

## 2022-12-26 DIAGNOSIS — Z950 Presence of cardiac pacemaker: Secondary | ICD-10-CM | POA: Diagnosis not present

## 2022-12-26 DIAGNOSIS — G4733 Obstructive sleep apnea (adult) (pediatric): Secondary | ICD-10-CM | POA: Diagnosis not present

## 2022-12-26 DIAGNOSIS — I700 Atherosclerosis of aorta: Secondary | ICD-10-CM | POA: Diagnosis not present

## 2022-12-26 DIAGNOSIS — I5032 Chronic diastolic (congestive) heart failure: Secondary | ICD-10-CM | POA: Insufficient documentation

## 2022-12-26 NOTE — Progress Notes (Signed)
Primary Care Physician: Caesar Bookman, NP Primary Cardiologist: Dr. Wyline Mood Primary Electrophysiologist: Dr. Graciela Husbands Referring Physician:    KEYSHAUN EXLEY is a 72 y.o. 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 Device alert on 5/9 showing Afib with 1.3% burden. Patient has a CHADS2VASC score of 6.  On evaluation today, he is currently in NSR. He did not have cardiac awareness of Afib episode. He is on continuous oxygen. He takes Plavix daily due to history of TIA. He does not drink alcohol, uses his CPAP, and drinks 1-2 cups of coffee daily.  On follow up 08/13/22, he is currently in NSR. Device report showed 3.4% Afib burden with episodes on 5/10, 5/17, and 5/24. The episode on 5/17 lasted for 7 days. No episodes in June. He is compliant with Eliquis overall; may have missed 1 dose a couple of weeks ago.   On follow up 12/26/22, he is currently in NSR. Device report from October showed <1% Afib burden within monitoring period. He is compliant with Eliquis and has no bleeding issues. He is recently s/p left renal tumor cryoablation and biopsy at [facility] on 12/11/22.   Today, he denies symptoms of palpitations, chest pain, shortness of breath, orthopnea, PND, lower extremity edema, dizziness, presyncope, syncope, snoring, daytime somnolence, bleeding, or neurologic sequela. The patient is tolerating medications without difficulties and is otherwise without complaint today.   Atrial Fibrillation Risk Factors:  he does have symptoms or diagnosis of sleep apnea. he is compliant with CPAP therapy. he  does not have a history of rheumatic fever. he does not have a history of alcohol use. The patient does not have a history of early familial atrial fibrillation or other arrhythmias.  he has a BMI of Body mass index is 50.86 kg/m.Marland Kitchen Filed Weights   12/26/22 1103  Weight: (!) 170.1 kg     Family History  Problem Relation Age of Onset   Diabetes Brother    Bipolar disorder Daughter    Anxiety disorder Son     Atrial Fibrillation Management history:  Previous antiarrhythmic drugs: None Previous cardioversions: None Previous ablations: None Anticoagulation history: Eliquis 5 mg BID   Past Medical History:  Diagnosis Date   Alcohol use disorder in remission 1999   Anxiety and depression    At high risk for falls    Former cigarette smoker 1999   Generalized anxiety disorder    Hard of hearing    Hyperlipidemia due to type 2 diabetes mellitus (HCC)    Hypertension    Morbid obesity (HCC)    Neuropathy    O2 dependent    Risk for falls    Sleep apnea    Type 2 diabetes mellitus (HCC)    Ventral hernia with bowel obstruction    Per Hospital Encounter on 05/21/22   Past Surgical History:  Procedure Laterality Date   APPENDECTOMY  1980   IR THORACENTESIS ASP PLEURAL SPACE W/IMG GUIDE  05/18/2021   IR THORACENTESIS ASP PLEURAL SPACE W/IMG GUIDE  10/06/2021   KNEE SURGERY Right    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   Procedure: PACEMAKER IMPLANT;  Surgeon: Duke Salvia, MD;  Location:  [facility];  Service: Cardiovascular;  Laterality: N/A;    Current Outpatient Medications  Medication Sig Dispense Refill   Accu-Chek Softclix Lancets lancets USE TO TEST BLOOD GLUCOSE ONCE DAILY AS DIRECTED 102 each 1   acetaminophen (TYLENOL) 325 MG tablet Take 2 tablets (650 mg total) by mouth every 6 (six) hours as needed for mild pain or moderate pain. 20 tablet 0   alfuzosin (UROXATRAL) 10 MG 24 hr tablet TAKE 1 TABLET BY MOUTH EVERY DAY 90 tablet 1   ALPRAZolam (XANAX) 0.5 MG  tablet TAKE 1 TABLET(0.5 MG) BY MOUTH TWICE DAILY AS NEEDED FOR ANXIETY OR SLEEP 60 tablet 1   apixaban (ELIQUIS) 5 MG TABS tablet TAKE 1 TABLET(5 MG) BY MOUTH TWICE DAILY 60 tablet 6   Calcium Carbonate (CALCIUM 600 PO) Take 1 tablet by mouth 2 (two) times daily.     Continuous Glucose Sensor (DEXCOM G7 SENSOR) MISC 1 Device by Does not apply route as directed. 9 each 3   cyanocobalamin 1000 MCG tablet Take 1,000 mcg by mouth daily.     diclofenac Sodium (VOLTAREN) 1 % GEL Apply 2 g topically 4 (four) times daily. Apply to your right neck and shoulder area 3-4 times a day as needed (Patient taking differently: Apply 2 g topically 4 (four) times daily as needed (pain). Apply to your right neck and shoulder area 3-4 times a day as needed) 350 g 1   DULoxetine (CYMBALTA) 60 MG capsule TAKE 1 CAPSULE(60 MG) BY MOUTH DAILY 90 capsule 1   empagliflozin (JARDIANCE) 25 MG TABS tablet Take 1 tablet (25 mg total) by mouth daily. 90 tablet 3   furosemide (LASIX) 80 MG tablet Take 80 mg by mouth 2 (two) times daily.     glipiZIDE (GLUCOTROL) 5 MG tablet Take 1 tablet (5 mg total) by mouth daily before breakfast. 90 tablet 3   glucose blood (ACCU-CHEK GUIDE) test strip USE TO TEST BLOOD SUGAR ONCE DAILY 200 strip 2   insulin aspart (NOVOLOG) 100 UNIT/ML injection Max daily 100 units 100 mL 3   Insulin Disposable Pump (OMNIPOD 5 G7 INTRO, GEN 5,) KIT 1 Device by Does not apply route every other day. 1 kit 0   Insulin Disposable Pump (OMNIPOD 5 G7 PODS, GEN 5,) MISC 1 Device by Does not apply route every other day. 45 each 3   Insulin Pen Needle 31G X 5 MM MISC 1 Device by Does not apply route daily in the afternoon. 100 each 3   isosorbide mononitrate (IMDUR) 60 MG 24 hr tablet TAKE 1 TABLET(60 MG) BY MOUTH DAILY 90 tablet 1   ketoconazole (NIZORAL) 2 % cream APPLY TOPICALLY TO THE AFFECTED AREA AS NEEDED FOR IRRITATION 15 g 5   Lancet Device MISC 1 Device by Does not apply route daily. 1 each 11   liraglutide  (VICTOZA) 18 MG/3ML SOPN Inject 1.8 mg into the skin daily in the afternoon. 27 mL 3   methocarbamol (ROBAXIN) 500 MG tablet TAKE 1 TABLET(500 MG) BY MOUTH EVERY 8 HOURS AS NEEDED FOR MUSCLE SPASMS 90 tablet 1   metolazone (ZAROXOLYN) 2.5 MG tablet Take 1 tablet (2.5 mg total) by mouth daily. Take 1-2 times a week as needed , spacing out 3 days between doses 90 tablet 1   mupirocin ointment (BACTROBAN) 2 % Apply topically 3 (three) times daily.     ondansetron (ZOFRAN) 4 MG tablet TAKE 1 TABLET(4 MG) BY MOUTH EVERY 8 HOURS AS NEEDED FOR NAUSEA OR VOMITING (  Patient taking differently: as needed.) 30 tablet 2   oxyCODONE (OXY IR/ROXICODONE) 5 MG immediate release tablet Take 5 mg by mouth as needed.     OXYGEN Inhale 2 L into the lungs continuous.     pantoprazole (PROTONIX) 40 MG tablet TAKE 1 TABLET(40 MG) BY MOUTH TWICE DAILY BEFORE A MEAL 180 tablet 1   potassium chloride SA (KLOR-CON M) 20 MEQ tablet Take 1 tablet (20 mEq total) by mouth 2 (two) times daily.     Red Yeast Rice 600 MG CAPS Take 1,200 mg by mouth daily.     rOPINIRole (REQUIP) 2 MG tablet TAKE 1 TABLET(2 MG) BY MOUTH AT BEDTIME 90 tablet 3   rosuvastatin (CRESTOR) 10 MG tablet TAKE 1 TABLET BY MOUTH EVERY DAY 90 tablet 1   simethicone (MYLICON) 80 MG chewable tablet Chew 80 mg by mouth as needed for flatulence.     tiZANidine (ZANAFLEX) 2 MG tablet TAKE 1 TABLET BY MOUTH TWICE DAILY (Patient taking differently: Take 2 mg by mouth at bedtime as needed for muscle spasms.) 180 tablet 1   topiramate (TOPAMAX) 25 MG tablet TAKE 1 TABLET BY MOUTH EVERY DAY 90 tablet 2   traMADol (ULTRAM) 50 MG tablet TAKE 1 TABLET(50 MG) BY MOUTH TWICE DAILY AS NEEDED 180 tablet 3   traZODone (DESYREL) 50 MG tablet TAKE 2 TABLETS(100 MG) BY MOUTH AT BEDTIME 60 tablet 3   triamcinolone cream (KENALOG) 0.1 % Apply 1 application. topically 2 (two) times daily. Apply to affected areas on shin area (Patient taking differently: Apply 1 application  topically 2  (two) times daily as needed (rash).) 45 g 0   Vitamin D, Ergocalciferol, (DRISDOL) 1.25 MG (50000 UNIT) CAPS capsule TAKE 1 CAPSULE BY MOUTH EVERY 7 DAYS 5 capsule 1   ferrous sulfate 324 MG TBEC Take 324 mg by mouth daily in the afternoon. Not started medication yet. (Patient not taking: Reported on 12/26/2022)     No current facility-administered medications for this encounter.    Allergies  Allergen Reactions   Tegaderm Ag Mesh [Silver] Other (See Comments)    blisters   Tape Rash    If left on for a long time    ROS- All systems are reviewed and negative except as per the HPI above.  Physical Exam: Vitals:   12/26/22 1103  BP: 112/60  Pulse: 81  Weight: (!) 170.1 kg  Height: 6' (1.829 m)    GEN- The patient is well appearing, alert and oriented x 3 today.   Neck - no JVD or carotid bruit noted Lungs- Clear to ausculation bilaterally, normal work of breathing Heart- Regular rate and rhythm, no murmurs, rubs or gallops, PMI not laterally displaced Extremities- no clubbing, cyanosis, or edema Skin - no rash or ecchymosis noted   Wt Readings from Last 3 Encounters:  12/26/22 (!) 170.1 kg  10/09/22 (!) 165.1 kg  10/01/22 (!) 164.2 kg    EKG today demonstrates  Vent. rate 81 BPM PR interval * ms QRS duration 166 ms QT/QTcB 420/487 ms P-R-T axes 79 69 -42 Ventricular-paced rhythm Abnormal ECG When compared with ECG of 13-Aug-2022 10:25, PREVIOUS ECG IS PRESENT  Echo 11/02/21 demonstrated: 1. Left ventricular ejection fraction, by estimation, is 70 to 75%. Left  ventricular ejection fraction by 2D MOD biplane is 71.7 %. The left  ventricle has hyperdynamic function. The left ventricle has no regional  wall motion abnormalities. There is  severe left ventricular hypertrophy. Left ventricular diastolic parameters  are consistent  with Grade I diastolic dysfunction (impaired relaxation).   2. Right ventricular systolic function is normal. The right ventricular  size  is normal.   3. Left atrial size was moderately dilated.   4. Right atrial size was moderately dilated.   5. The mitral valve was not well visualized. No evidence of mitral valve  regurgitation.   6. The aortic valve was not well visualized. Aortic valve regurgitation  is not visualized.   7. Aortic dilatation noted. There is mild dilatation of the aortic root,  measuring 43 mm.   8. Agitated saline contrast bubble study was negative, with no evidence  of any interatrial shunt.   Comparison(s): Changes from prior study are noted. 05/14/2021: LVEF 60-65%,  aortic root dilated to 41 mm.   Epic records are reviewed at length today.  CHA2DS2-VASc Score = 7  The patient's score is based upon: CHF History: 1 HTN History: 1 Diabetes History: 1 Stroke History: 2 Vascular Disease History: 1 Age Score: 1 Gender Score: 0      ASSESSMENT AND PLAN: Persistent Atrial Fibrillation (ICD10:  I48.0) The patient's CHA2DS2-VASc score is 7, indicating a 11.2% annual risk of stroke.    He is currently in NSR. Recent device interrogation with <1% Afib burden.  We will continue with conservative observation via pacemaker interrogations. If he has increased burden will require AAD. He is not a candidate for ablation at this time.  Secondary Hypercoagulable State (ICD10:  D68.69) The patient is at significant risk for stroke/thromboembolism based upon his CHA2DS2-VASc Score of 7.  Start Apixaban (Eliquis).  Continue Eliquis without interruption.   3. Obesity Body mass index is 50.86 kg/m. Lifestyle modification was discussed at length including regular exercise and weight reduction. Encouraged daily walking as tolerated.  4. Obstructive sleep apnea Compliant with CPAP   Will arrange 6 month f/u with Dr. Graciela Husbands.    [place] Bells, PA-C Afib Clinic [facility] [address] [place], [place] [address] [phone] 12/26/2022 1:45 PM

## 2022-12-27 ENCOUNTER — Ambulatory Visit: Payer: Self-pay

## 2022-12-27 DIAGNOSIS — N2889 Other specified disorders of kidney and ureter: Secondary | ICD-10-CM | POA: Diagnosis not present

## 2022-12-27 DIAGNOSIS — I480 Paroxysmal atrial fibrillation: Secondary | ICD-10-CM | POA: Diagnosis not present

## 2022-12-27 DIAGNOSIS — Z483 Aftercare following surgery for neoplasm: Secondary | ICD-10-CM | POA: Diagnosis not present

## 2022-12-27 DIAGNOSIS — E1142 Type 2 diabetes mellitus with diabetic polyneuropathy: Secondary | ICD-10-CM | POA: Diagnosis not present

## 2022-12-27 DIAGNOSIS — D4102 Neoplasm of uncertain behavior of left kidney: Secondary | ICD-10-CM | POA: Diagnosis not present

## 2022-12-27 DIAGNOSIS — I700 Atherosclerosis of aorta: Secondary | ICD-10-CM | POA: Diagnosis not present

## 2022-12-27 DIAGNOSIS — E1122 Type 2 diabetes mellitus with diabetic chronic kidney disease: Secondary | ICD-10-CM | POA: Diagnosis not present

## 2022-12-27 DIAGNOSIS — I130 Hypertensive heart and chronic kidney disease with heart failure and stage 1 through stage 4 chronic kidney disease, or unspecified chronic kidney disease: Secondary | ICD-10-CM | POA: Diagnosis not present

## 2022-12-27 DIAGNOSIS — I5022 Chronic systolic (congestive) heart failure: Secondary | ICD-10-CM | POA: Diagnosis not present

## 2022-12-27 DIAGNOSIS — N183 Chronic kidney disease, stage 3 unspecified: Secondary | ICD-10-CM | POA: Diagnosis not present

## 2022-12-27 NOTE — Patient Outreach (Signed)
  Care Coordination   Follow Up Visit Note   12/27/2022 Name: Jesse [name] MRN: [id number] DOB: September 26, 1950  Jesse [name] is a 72 y.o. year old male who sees Ngetich, [name], NP for primary care. I spoke with  [name] by phone today.  What matters to the patients health and wellness today?  Patient would like to maintain adequate kidney function and stay healthy overall.     Goals Addressed             This Visit's Progress    To undergo left CT-guided cryoablation for renal cell carcinoma   On track    Care Coordination Interventions: Evaluation of current treatment plan related to left renal cell carcinoma and patient's adherence to plan as established by provider Determined patient underwent the left renal tumor cryoablation and biopsy Reviewed and discussed with patient his post procedure follow up;  ASSESSMENT & PLAN Mr. Linder is doing well after their procedure and has resumed normal activities. His small frostbite wound has healed nicely. GFR has increased. I will email his urology team to schedule follow-up with imaging  Biopsy: A. 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  Plan: - Follow up with [facility] in 3-6 months with imaging. A staff msg was sent regarding the biopsy results, our completion of treatment, and request for urology f/u   Assessed for patient knowledge and understanding of his prescribed treatment plan and patient verbalizes understanding Instructed patient to notify his doctor of new symptoms or concerns promptly      Interventions Today    Flowsheet Row Most Recent Value  Chronic Disease   Chronic disease during today's visit Congestive Heart Failure (CHF), Atrial Fibrillation (AFib), Other  [s/p renal cell carcinoma cryoablation procedure]  General Interventions   General Interventions Discussed/Reviewed General Interventions Discussed, General Interventions Reviewed, Doctor Visits,  Labs  Doctor Visits Discussed/Reviewed Doctor Visits Reviewed, Doctor Visits Discussed, Specialist, PCP  Education Interventions   Education Provided Provided Education  Provided Verbal Education On When to see the doctor, Labs, Medication  Labs Reviewed Kidney Function  Pharmacy Interventions   Pharmacy Dicussed/Reviewed Pharmacy Topics Reviewed, Pharmacy Topics Discussed, Medications and their functions          SDOH assessments and interventions completed:  No     Care Coordination Interventions:  Yes, provided   Follow up plan: Follow up call scheduled for 04/16/23 @2 :30 PM    Encounter Outcome:  Patient Visit Completed

## 2022-12-27 NOTE — Patient Instructions (Signed)
Visit Information  Thank you for taking time to visit with me today. Please don't hesitate to contact me if I can be of assistance to you.   Following are the goals we discussed today:   Goals Addressed             This Visit's Progress    To undergo left CT-guided cryoablation for renal cell carcinoma   On track    Care Coordination Interventions: Evaluation of current treatment plan related to left renal cell carcinoma and patient's adherence to plan as established by provider Determined patient underwent the left renal tumor cryoablation and biopsy Reviewed and discussed with patient his post procedure follow up;  ASSESSMENT & PLAN Mr. Mantel is doing well after their procedure and has resumed normal activities. His small frostbite wound has healed nicely. GFR has increased. I will email his urology team to schedule follow-up with imaging  Biopsy: A. 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  Plan: - Follow up with [facility] in 3-6 months with imaging. A staff msg was sent regarding the biopsy results, our completion of treatment, and request for urology f/u   Assessed for patient knowledge and understanding of his prescribed treatment plan and patient verbalizes understanding Instructed patient to notify his doctor of new symptoms or concerns promptly          Our next appointment is by telephone on 04/16/23 at 2:30 PM  Please call the care guide team at [phone] if you need to cancel or reschedule your appointment.   If you are experiencing a Mental Health or Behavioral Health Crisis or need someone to talk to, please call 1-800-273-TALK (toll free, 24 hour hotline)  Patient verbalizes understanding of instructions and care plan provided today and agrees to view in MyChart. Active MyChart status and patient understanding of how to access instructions and care plan via MyChart confirmed with patient.     Delsa Sale RN BSN  CCM   [facility] [place], [facility] Nurse Care Coordinator  Direct Dial: [phone] Website: [email or web address]

## 2022-12-31 ENCOUNTER — Encounter: Payer: Self-pay | Admitting: Internal Medicine

## 2022-12-31 DIAGNOSIS — Z483 Aftercare following surgery for neoplasm: Secondary | ICD-10-CM | POA: Diagnosis not present

## 2022-12-31 DIAGNOSIS — D4102 Neoplasm of uncertain behavior of left kidney: Secondary | ICD-10-CM | POA: Diagnosis not present

## 2022-12-31 DIAGNOSIS — I480 Paroxysmal atrial fibrillation: Secondary | ICD-10-CM | POA: Diagnosis not present

## 2022-12-31 DIAGNOSIS — I130 Hypertensive heart and chronic kidney disease with heart failure and stage 1 through stage 4 chronic kidney disease, or unspecified chronic kidney disease: Secondary | ICD-10-CM | POA: Diagnosis not present

## 2022-12-31 DIAGNOSIS — I5022 Chronic systolic (congestive) heart failure: Secondary | ICD-10-CM | POA: Diagnosis not present

## 2022-12-31 DIAGNOSIS — E1142 Type 2 diabetes mellitus with diabetic polyneuropathy: Secondary | ICD-10-CM | POA: Diagnosis not present

## 2022-12-31 DIAGNOSIS — N183 Chronic kidney disease, stage 3 unspecified: Secondary | ICD-10-CM | POA: Diagnosis not present

## 2022-12-31 DIAGNOSIS — E1122 Type 2 diabetes mellitus with diabetic chronic kidney disease: Secondary | ICD-10-CM | POA: Diagnosis not present

## 2022-12-31 DIAGNOSIS — I700 Atherosclerosis of aorta: Secondary | ICD-10-CM | POA: Diagnosis not present

## 2023-01-01 ENCOUNTER — Other Ambulatory Visit: Payer: Self-pay | Admitting: Internal Medicine

## 2023-01-01 DIAGNOSIS — E1165 Type 2 diabetes mellitus with hyperglycemia: Secondary | ICD-10-CM

## 2023-01-01 DIAGNOSIS — M19041 Primary osteoarthritis, right hand: Secondary | ICD-10-CM | POA: Diagnosis not present

## 2023-01-01 NOTE — Progress Notes (Signed)
Remote pacemaker transmission.   

## 2023-01-02 DIAGNOSIS — E1142 Type 2 diabetes mellitus with diabetic polyneuropathy: Secondary | ICD-10-CM | POA: Diagnosis not present

## 2023-01-02 DIAGNOSIS — I700 Atherosclerosis of aorta: Secondary | ICD-10-CM | POA: Diagnosis not present

## 2023-01-02 DIAGNOSIS — N183 Chronic kidney disease, stage 3 unspecified: Secondary | ICD-10-CM | POA: Diagnosis not present

## 2023-01-02 DIAGNOSIS — I130 Hypertensive heart and chronic kidney disease with heart failure and stage 1 through stage 4 chronic kidney disease, or unspecified chronic kidney disease: Secondary | ICD-10-CM | POA: Diagnosis not present

## 2023-01-02 DIAGNOSIS — D4102 Neoplasm of uncertain behavior of left kidney: Secondary | ICD-10-CM | POA: Diagnosis not present

## 2023-01-02 DIAGNOSIS — I5022 Chronic systolic (congestive) heart failure: Secondary | ICD-10-CM | POA: Diagnosis not present

## 2023-01-02 DIAGNOSIS — I480 Paroxysmal atrial fibrillation: Secondary | ICD-10-CM | POA: Diagnosis not present

## 2023-01-02 DIAGNOSIS — E1122 Type 2 diabetes mellitus with diabetic chronic kidney disease: Secondary | ICD-10-CM | POA: Diagnosis not present

## 2023-01-02 DIAGNOSIS — Z483 Aftercare following surgery for neoplasm: Secondary | ICD-10-CM | POA: Diagnosis not present

## 2023-01-03 ENCOUNTER — Encounter: Payer: Self-pay | Admitting: Internal Medicine

## 2023-01-03 ENCOUNTER — Ambulatory Visit: Payer: Medicare PPO | Admitting: Internal Medicine

## 2023-01-03 ENCOUNTER — Other Ambulatory Visit: Payer: Medicare PPO

## 2023-01-03 VITALS — BP 132/70 | HR 74 | Wt 378.0 lb

## 2023-01-03 DIAGNOSIS — E1165 Type 2 diabetes mellitus with hyperglycemia: Secondary | ICD-10-CM

## 2023-01-03 DIAGNOSIS — E1142 Type 2 diabetes mellitus with diabetic polyneuropathy: Secondary | ICD-10-CM | POA: Diagnosis not present

## 2023-01-03 DIAGNOSIS — E1122 Type 2 diabetes mellitus with diabetic chronic kidney disease: Secondary | ICD-10-CM

## 2023-01-03 DIAGNOSIS — Z794 Long term (current) use of insulin: Secondary | ICD-10-CM

## 2023-01-03 DIAGNOSIS — R29898 Other symptoms and signs involving the musculoskeletal system: Secondary | ICD-10-CM

## 2023-01-03 DIAGNOSIS — N1832 Chronic kidney disease, stage 3b: Secondary | ICD-10-CM | POA: Diagnosis not present

## 2023-01-03 DIAGNOSIS — E113299 Type 2 diabetes mellitus with mild nonproliferative diabetic retinopathy without macular edema, unspecified eye: Secondary | ICD-10-CM | POA: Diagnosis not present

## 2023-01-03 LAB — MICROALBUMIN / CREATININE URINE RATIO
Creatinine,U: 21.4 mg/dL
Microalb Creat Ratio: 3.3 mg/g (ref 0.0–30.0)
Microalb, Ur: 0.7 mg/dL (ref 0.0–1.9)

## 2023-01-03 LAB — POCT GLYCOSYLATED HEMOGLOBIN (HGB A1C): Hemoglobin A1C: 7.5 % — AB (ref 4.0–5.6)

## 2023-01-03 NOTE — Patient Instructions (Signed)
Enter 5 grams with each meal   STOP Glipizide   HOW TO TREAT LOW BLOOD SUGARS (Blood sugar LESS THAN 70 MG/DL) Please follow the RULE OF 15 for the treatment of hypoglycemia treatment (when your (blood sugars are less than 70 mg/dL)   STEP 1: Take 15 grams of carbohydrates when your blood sugar is low, which includes:  3-4 GLUCOSE TABS  OR 3-4 OZ OF JUICE OR REGULAR SODA OR ONE TUBE OF GLUCOSE GEL    STEP 2: RECHECK blood sugar in 15 MINUTES STEP 3: If your blood sugar is still low at the 15 minute recheck --> then, go back to STEP 1 and treat AGAIN with another 15 grams of carbohydrates.

## 2023-01-03 NOTE — Progress Notes (Signed)
Name: Jesse [name]  MRN/ DOB: [id number], Sep 21, 1950   Age/ Sex: 72 y.o., male    PCP: Ngetich, [name], NP   Reason for Endocrinology Evaluation: Type 2 Diabetes Mellitus     Date of Initial Endocrinology Visit: 10/01/2022    PATIENT IDENTIFIER: [name] is a 72 y.o. male with a past medical history of DM, OSA, chronic respiratory failure with hypoxia, CKD, CHF, paroxysmal A-fib.  And RCC ( Dx 2024) .The patient presented for initial endocrinology clinic visit on 10/01/2022 for consultative assistance with his diabetes management.    HPI: Mr. Hanes was    Diagnosed with DM age 45 Prior Medications tried/Intolerance: Metformin - no intolerant. Glimepiride - hypoglycemia         Hemoglobin A1c has ranged from 5.7% in 2023, peaking at 10.1% in 2024.  Lives in independent living    Patient was hospitalized 09/24/2022 for small bowel obstruction, of note the patient has a history of ostomy placement after ischemic transverse colon resection 2017   Patient with left abdominal hernia    Patient with congenital left foot deformity, requiring metatarsal and heel surgery Patient has noted upper extremity fatigue that is chronic in nature   On his initial visit to our clinic he had an A1c of 10.1%, he was on Jardiance, Victoza, and Lantus, I started him on glipizide, and referred him for insulin pump training  He was started on insulin pump 10/2022    SUBJECTIVE:   During the last visit (10/01/2022): A1c 10.1%  Today (01/03/23): CADDEN DETLOFF is here for follow-up on diabetes management.  He checks his  blood sugars multiple times daily. The patient has not  had hypoglycemic episodes since the last clinic visit.  Patient continues to follow-up with cardiology for A-fib He is s/p renal cryoablation 11/2022 for renal mass    Patient has chronic numbness of the hands and feet He takes Zofran for nausea in the morning Denies vomiting  No changes in bowel  movements    This patient with type 2 diabetes is treated with Omnipod (insulin pump). During the visit the pump basal and bolus doses were reviewed including carb/insulin rations and supplemental doses. The clinical list was updated. The glucose meter download was reviewed in detail to determine if the current pump settings are providing the best glycemic control without excessive hypoglycemia.  Pump and meter download:    Pump   Omnipod  Settings   Insulin type   Novolog    Basal rate       0000 1.4 u/h               I:C ratio       0000 1:1                   Sensitivity       0000  25      Goal       0000  110            Type & Model of Pump: OmniPod Insulin Type: Currently using NovoLog.  Body mass index is 51.27 kg/m.  PUMP STATISTICS: No data     HOME DIABETES REGIMEN: Glipizide 5 mg daily Jardiance 25 mg daily Victoza 1.8 mg daily NovoLog   Statin: no ACE-I/ARB: no   CONTINUOUS GLUCOSE MONITORING RECORD INTERPRETATION    Dates of Recording: 11/1 - 01/03/2023  Sensor description: Dexcom  Results statistics:   CGM use % of time 93  Average and SD 225/57  Time in range    28%  % Time Above 180 39  % Time above 250 33  % Time Below target 0   Glycemic patterns summary: BG's trend down overnight and fluctuate throughout the day  Hyperglycemic episodes postprandial  Hypoglycemic episodes occurred N/A  Overnight periods: Optimal   DIABETIC COMPLICATIONS: Microvascular complications:  CKD III, neuropathy, minimal DR  Denies: retinopathy Last eye exam: Completed 05/2022  Macrovascular complications:   Denies: CAD, PVD, CVA   PAST HISTORY: Past Medical History:  Past Medical History:  Diagnosis Date   Alcohol use disorder in remission 1999   Anxiety and depression    At high risk for falls    Former cigarette smoker 1999   Generalized anxiety disorder    Hard of hearing    Hyperlipidemia due to type 2 diabetes  mellitus (HCC)    Hypertension    Morbid obesity (HCC)    Neuropathy    O2 dependent    Risk for falls    Sleep apnea    Type 2 diabetes mellitus (HCC)    Ventral hernia with bowel obstruction    Per Hospital Encounter on 05/21/22   Past Surgical History:  Past Surgical History:  Procedure Laterality Date   APPENDECTOMY  1980   IR THORACENTESIS ASP PLEURAL SPACE W/IMG GUIDE  05/18/2021   IR THORACENTESIS ASP PLEURAL SPACE W/IMG GUIDE  10/06/2021   KNEE SURGERY Right    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   Procedure: PACEMAKER IMPLANT;  Surgeon: Duke Salvia, MD;  Location: [facility] INVASIVE CV LAB;  Service: Cardiovascular;  Laterality: N/A;    Social History:  reports that he quit smoking about 25 years ago. His smoking use included cigarettes. He has never used smokeless tobacco. He reports that he does not currently use alcohol. He reports that he does not use drugs. Family History:  Family History  Problem Relation Age of Onset   Diabetes Brother    Bipolar disorder Daughter    Anxiety disorder Son      HOME MEDICATIONS: Allergies as of 01/03/2023       Reactions   Tape Rash   If left on for a long time        Medication List        Accurate as of January 03, 2023 10:50 AM. If you have any questions, ask your nurse or doctor.          Accu-Chek Guide test strip Generic drug: glucose blood USE TO TEST BLOOD SUGAR ONCE DAILY   Accu-Chek Softclix Lancets lancets USE TO TEST BLOOD GLUCOSE ONCE DAILY AS DIRECTED   acetaminophen 650 MG CR tablet Commonly known as: TYLENOL Take by mouth.   acetaminophen 325 MG tablet Commonly known as: TYLENOL Take 2 tablets (650 mg total) by mouth every 6 (six) hours as needed for mild pain or moderate pain.   alfuzosin 10 MG 24 hr tablet Commonly known as: UROXATRAL TAKE 1 TABLET BY MOUTH EVERY DAY   ALPRAZolam 0.5 MG tablet Commonly known as: XANAX TAKE 1 TABLET(0.5 MG) BY MOUTH TWICE DAILY AS NEEDED FOR ANXIETY OR  SLEEP   CALCIUM 600 PO Take 1 tablet by mouth 2 (two) times daily.   cyanocobalamin 1000 MCG tablet Take 1,000 mcg by mouth daily.   Dexcom G7 Sensor Misc 1 Device by Does not apply route as directed.   diclofenac Sodium 1 % Gel Commonly known as: VOLTAREN Apply 2 g topically 4 (four)  times daily. Apply to your right neck and shoulder area 3-4 times a day as needed What changed:  when to take this reasons to take this   DULoxetine 60 MG capsule Commonly known as: CYMBALTA TAKE 1 CAPSULE(60 MG) BY MOUTH DAILY   Eliquis 5 MG Tabs tablet Generic drug: apixaban TAKE 1 TABLET(5 MG) BY MOUTH TWICE DAILY   empagliflozin 25 MG Tabs tablet Commonly known as: Jardiance Take 1 tablet (25 mg total) by mouth daily.   ferrous sulfate 324 MG Tbec Take 324 mg by mouth daily in the afternoon. Not started medication yet.   furosemide 80 MG tablet Commonly known as: LASIX Take 80 mg by mouth 2 (two) times daily.   glipiZIDE 5 MG tablet Commonly known as: GLUCOTROL Take 1 tablet (5 mg total) by mouth daily before breakfast.   insulin aspart 100 UNIT/ML injection Commonly known as: novoLOG Max daily 100 units   Insulin Pen Needle 31G X 5 MM Misc 1 Device by Does not apply route daily in the afternoon.   isosorbide mononitrate 60 MG 24 hr tablet Commonly known as: IMDUR TAKE 1 TABLET(60 MG) BY MOUTH DAILY   ketoconazole 2 % cream Commonly known as: NIZORAL APPLY TOPICALLY TO THE AFFECTED AREA AS NEEDED FOR IRRITATION   Lancet Device Misc 1 Device by Does not apply route daily.   liraglutide 18 MG/3ML Sopn Commonly known as: Victoza Inject 1.8 mg into the skin daily in the afternoon.   methocarbamol 500 MG tablet Commonly known as: ROBAXIN TAKE 1 TABLET(500 MG) BY MOUTH EVERY 8 HOURS AS NEEDED FOR MUSCLE SPASMS   metolazone 2.5 MG tablet Commonly known as: ZAROXOLYN Take 1 tablet (2.5 mg total) by mouth daily. Take 1-2 times a week as needed , spacing out 3 days between  doses   mupirocin ointment 2 % Commonly known as: BACTROBAN Apply topically 3 (three) times daily.   Omnipod 5 G7 Intro (Gen 5) Kit 1 Device by Does not apply route every other day.   Omnipod 5 G7 Pods (Gen 5) Misc 1 Device by Does not apply route every other day.   ondansetron 4 MG tablet Commonly known as: ZOFRAN TAKE 1 TABLET(4 MG) BY MOUTH EVERY 8 HOURS AS NEEDED FOR NAUSEA OR VOMITING What changed: See the new instructions.   oxyCODONE 5 MG immediate release tablet Commonly known as: Oxy IR/ROXICODONE Take 5 mg by mouth as needed.   OXYGEN Inhale 2 L into the lungs continuous.   pantoprazole 40 MG tablet Commonly known as: PROTONIX TAKE 1 TABLET(40 MG) BY MOUTH TWICE DAILY BEFORE A MEAL   potassium chloride SA 20 MEQ tablet Commonly known as: KLOR-CON M Take 1 tablet (20 mEq total) by mouth 2 (two) times daily.   Red Yeast Rice 600 MG Caps Take 1,200 mg by mouth daily.   rOPINIRole 2 MG tablet Commonly known as: REQUIP TAKE 1 TABLET(2 MG) BY MOUTH AT BEDTIME   rosuvastatin 10 MG tablet Commonly known as: CRESTOR TAKE 1 TABLET BY MOUTH EVERY DAY   simethicone 80 MG chewable tablet Commonly known as: MYLICON Chew 80 mg by mouth as needed for flatulence.   tiZANidine 2 MG tablet Commonly known as: ZANAFLEX TAKE 1 TABLET BY MOUTH TWICE DAILY What changed:  when to take this reasons to take this   topiramate 25 MG tablet Commonly known as: TOPAMAX TAKE 1 TABLET BY MOUTH EVERY DAY   traMADol 50 MG tablet Commonly known as: ULTRAM TAKE 1 TABLET(50 MG) BY MOUTH  TWICE DAILY AS NEEDED   traZODone 50 MG tablet Commonly known as: DESYREL TAKE 2 TABLETS(100 MG) BY MOUTH AT BEDTIME   triamcinolone cream 0.1 % Commonly known as: KENALOG Apply 1 application. topically 2 (two) times daily. Apply to affected areas on shin area What changed:  when to take this reasons to take this additional instructions   Vitamin D (Ergocalciferol) 1.25 MG (50000 UNIT)  Caps capsule Commonly known as: DRISDOL TAKE 1 CAPSULE BY MOUTH EVERY 7 DAYS         ALLERGIES: Allergies  Allergen Reactions   Tape Rash    If left on for a long time     REVIEW OF SYSTEMS: A comprehensive ROS was conducted with the patient and is negative    OBJECTIVE:   VITAL SIGNS: BP 132/70 (BP Location: Left Arm, Patient Position: Sitting, Cuff Size: Large)   Pulse 74   Wt (!) 378 lb (171.5 kg) Comment: patient reported  SpO2 96%   BMI 51.27 kg/m    PHYSICAL EXAM:  General: Pt appears well and is in NAD Patient is in a wheelchair  Lungs: Clear with good BS bilat   Heart: RRR   Neuro: MS is good with appropriate affect, pt is alert and Ox3    DM foot exam: 10/01/2022  B/L feet deformity noted  The pedal pulses are 1+ on right and 1+ on left. The sensation is decrease  to a screening 5.07, 10 gram monofilament bilaterally    DATA REVIEWED:  Lab Results  Component Value Date   HGBA1C 10.1 (H) 09/24/2022   HGBA1C 8.0 12/20/2021   HGBA1C 6.8 (H) 11/02/2021     Latest Reference Range & Units 10/09/22 12:00  Sodium 135 - 146 mmol/L 138  Potassium 3.5 - 5.3 mmol/L 3.9  Chloride 98 - 110 mmol/L 102  CO2 20 - 32 mmol/L 25  Glucose 65 - 99 mg/dL 161 (H)  BUN 7 - 25 mg/dL 31 (H)  Creatinine 0.96 - 1.28 mg/dL 0.45 (H)  Calcium 8.6 - 10.3 mg/dL 9.1  BUN/Creatinine Ratio 6 - 22 (calc) 17  eGFR > OR = 60 mL/min/1.56m2 38 (L)  AG Ratio 1.0 - 2.5 (calc) 1.5  AST 10 - 35 U/L 13  ALT 9 - 46 U/L 11  Total Protein 6.1 - 8.1 g/dL 6.8  Total Bilirubin 0.2 - 1.2 mg/dL 0.4  Total CHOL/HDL Ratio <5.0 (calc) 3.0  Cholesterol <200 mg/dL 409  HDL Cholesterol > OR = 40 mg/dL 50  LDL Cholesterol (Calc) mg/dL (calc) 75  Non-HDL Cholesterol (Calc) <130 mg/dL (calc) 811  Triglycerides <150 mg/dL 914 (H)    ASSESSMENT / PLAN / RECOMMENDATIONS:   1) Type 2 Diabetes Mellitus, Sub-optimally  controlled, With CKD III , neuropathic and retinopathic complications - Most  recent A1c of 7.5 %. Goal A1c < 7.0 %.     -A1c has trended down from 10.1% to 7.5% -We have not been able to download his OmniPod today, patient to contact OmniPod -We were able to download Dexcom, patient has been noted with hyperglycemia throughout the day, BG's trend to just below upper level of normal overnight, I will increase his basal rate and increase his carbohydrate entry with each meal from 4 g to 5 g -Patient was also advised to stop glipizide    MEDICATIONS: Stop  glipizide  Continue Jardiance 25 mg daily Continue Victoza 1.8 mg daily    Pump   Omnipod  Settings   Insulin type   Novolog  Basal rate       0000 1.7 u/h               I:C ratio       0000 1:1     Enter #5 g with each meal              Sensitivity       0000  25      Goal       0000  110         EDUCATION / INSTRUCTIONS: BG monitoring instructions: Patient is instructed to check his blood sugars 3 times a day, before meals. Call [facility] if: BG persistently < 70  I reviewed the Rule of 15 for the treatment of hypoglycemia in detail with the patient. Literature supplied.   2) Diabetic complications:  Eye: Does  have known diabetic retinopathy.  Neuro/ Feet: Does  have known diabetic peripheral neuropathy. Renal: Patient does  have known baseline CKD.     3) Upper extremity weakness :   -Will screen for Cushing syndrome with 24-hour urinary cortisol -He did bring a 12-hour urine collection, but unfortunately he will have to redo this for 24 hours    Follow-up in 4 months   Signed electronically by: Lyndle Herrlich, MD  [facility] Endocrinology  [facility] [address] [place], [place] [address] Phone: [phone] FAX: [phone]   CC: Caesar Bookman, NP [address] [place] [address] Phone: [phone]  Fax: [phone]    Return to Endocrinology clinic as below: Future Appointments  Date Time  Provider Department Center  01/03/2023 11:30 AM [facility] ENDO/NEURO LAB [facility] None  01/03/2023 11:50 AM Carnisha Feltz, Konrad Dolores, MD [facility] None  03/15/2023  7:35 AM [facility] DEVICE REMOTES [facility]  04/12/2023 11:00 AM Ngetich, [name], NP [facility] None  04/16/2023  2:30 PM Little, Karma Lew, RN [facility] None  06/14/2023  7:35 AM [facility] DEVICE REMOTES [facility]  07/02/2023 11:00 AM Ngetich, [name] C, NP [facility] None  09/13/2023  7:35 AM [facility] DEVICE REMOTES [facility]  12/13/2023  7:35 AM [facility] DEVICE REMOTES [facility]  03/13/2024  7:35 AM [facility] DEVICE REMOTES [facility]

## 2023-01-04 ENCOUNTER — Telehealth: Payer: Self-pay

## 2023-01-04 DIAGNOSIS — I130 Hypertensive heart and chronic kidney disease with heart failure and stage 1 through stage 4 chronic kidney disease, or unspecified chronic kidney disease: Secondary | ICD-10-CM | POA: Diagnosis not present

## 2023-01-04 DIAGNOSIS — E1142 Type 2 diabetes mellitus with diabetic polyneuropathy: Secondary | ICD-10-CM | POA: Diagnosis not present

## 2023-01-04 DIAGNOSIS — D4102 Neoplasm of uncertain behavior of left kidney: Secondary | ICD-10-CM | POA: Diagnosis not present

## 2023-01-04 DIAGNOSIS — E1122 Type 2 diabetes mellitus with diabetic chronic kidney disease: Secondary | ICD-10-CM | POA: Diagnosis not present

## 2023-01-04 DIAGNOSIS — I480 Paroxysmal atrial fibrillation: Secondary | ICD-10-CM | POA: Diagnosis not present

## 2023-01-04 DIAGNOSIS — I700 Atherosclerosis of aorta: Secondary | ICD-10-CM | POA: Diagnosis not present

## 2023-01-04 DIAGNOSIS — I5022 Chronic systolic (congestive) heart failure: Secondary | ICD-10-CM | POA: Diagnosis not present

## 2023-01-04 DIAGNOSIS — Z483 Aftercare following surgery for neoplasm: Secondary | ICD-10-CM | POA: Diagnosis not present

## 2023-01-04 DIAGNOSIS — N183 Chronic kidney disease, stage 3 unspecified: Secondary | ICD-10-CM | POA: Diagnosis not present

## 2023-01-04 NOTE — Telephone Encounter (Signed)
   Pre-operative Risk Assessment    Patient Name: Jesse [name]  DOB: Mar 29, 1950 MRN: [id number]      Request for Surgical Clearance    Procedure:   RIGHT INDEX FINGER ARTHODESIS  Date of Surgery:  Clearance TBD                                 Surgeon:  DR. Mack Hook Surgeon's Group or Practice Name:  [facility] Phone number:  [phone] Fax number:  [phone]   Type of Clearance Requested:   - Medical    Type of Anesthesia:  Not Indicated   Additional requests/questions:    SignedMichaelle Copas   01/04/2023, 4:31 PM

## 2023-01-04 NOTE — Telephone Encounter (Signed)
   Name: SUSAN HOOLE  DOB: [birth year]  MRN: [id number]  Primary Cardiologist: Maisie Fus, MD  Chart reviewed as part of pre-operative protocol coverage. Because of Crecencio Cuppett Cullifer's past medical history and time since last visit, he will require a follow-up in-office visit in order to better assess preoperative cardiovascular risk. He is past due for his 6 month follow-up.   Pre-op covering staff: - Please schedule appointment and call patient to inform them. If patient already had an upcoming appointment within acceptable timeframe, please add "pre-op clearance" to the appointment notes so provider is aware. - Please contact requesting surgeon's office via preferred method (i.e, phone, fax) to inform them of need for appointment prior to surgery.  Denyce Robert, NP  01/04/2023, 4:41 PM

## 2023-01-04 NOTE — Telephone Encounter (Signed)
Spoke with patient who is agreeable to do an office visit on 11/25 at 1:55 pm with Bernadene Person, NP.

## 2023-01-07 ENCOUNTER — Encounter: Payer: Self-pay | Admitting: Internal Medicine

## 2023-01-07 ENCOUNTER — Other Ambulatory Visit: Payer: Self-pay | Admitting: Family

## 2023-01-07 DIAGNOSIS — D4102 Neoplasm of uncertain behavior of left kidney: Secondary | ICD-10-CM | POA: Diagnosis not present

## 2023-01-07 DIAGNOSIS — Z483 Aftercare following surgery for neoplasm: Secondary | ICD-10-CM | POA: Diagnosis not present

## 2023-01-07 DIAGNOSIS — I480 Paroxysmal atrial fibrillation: Secondary | ICD-10-CM | POA: Diagnosis not present

## 2023-01-07 DIAGNOSIS — N183 Chronic kidney disease, stage 3 unspecified: Secondary | ICD-10-CM | POA: Diagnosis not present

## 2023-01-07 DIAGNOSIS — I130 Hypertensive heart and chronic kidney disease with heart failure and stage 1 through stage 4 chronic kidney disease, or unspecified chronic kidney disease: Secondary | ICD-10-CM | POA: Diagnosis not present

## 2023-01-07 DIAGNOSIS — G2581 Restless legs syndrome: Secondary | ICD-10-CM

## 2023-01-07 DIAGNOSIS — E1122 Type 2 diabetes mellitus with diabetic chronic kidney disease: Secondary | ICD-10-CM | POA: Diagnosis not present

## 2023-01-07 DIAGNOSIS — E1142 Type 2 diabetes mellitus with diabetic polyneuropathy: Secondary | ICD-10-CM | POA: Diagnosis not present

## 2023-01-07 DIAGNOSIS — I5022 Chronic systolic (congestive) heart failure: Secondary | ICD-10-CM | POA: Diagnosis not present

## 2023-01-07 DIAGNOSIS — I700 Atherosclerosis of aorta: Secondary | ICD-10-CM | POA: Diagnosis not present

## 2023-01-08 ENCOUNTER — Encounter: Payer: Self-pay | Admitting: Internal Medicine

## 2023-01-08 MED ORDER — TIRZEPATIDE 5 MG/0.5ML ~~LOC~~ SOAJ: 5.0000 mg | SUBCUTANEOUS | 3 refills | Status: DC

## 2023-01-09 DIAGNOSIS — E118 Type 2 diabetes mellitus with unspecified complications: Secondary | ICD-10-CM | POA: Diagnosis not present

## 2023-01-09 DIAGNOSIS — Z933 Colostomy status: Secondary | ICD-10-CM | POA: Diagnosis not present

## 2023-01-09 DIAGNOSIS — E119 Type 2 diabetes mellitus without complications: Secondary | ICD-10-CM | POA: Diagnosis not present

## 2023-01-09 DIAGNOSIS — Z794 Long term (current) use of insulin: Secondary | ICD-10-CM | POA: Diagnosis not present

## 2023-01-09 DIAGNOSIS — K56609 Unspecified intestinal obstruction, unspecified as to partial versus complete obstruction: Secondary | ICD-10-CM | POA: Diagnosis not present

## 2023-01-10 ENCOUNTER — Encounter: Payer: Self-pay | Admitting: Internal Medicine

## 2023-01-10 NOTE — Telephone Encounter (Signed)
Reviewed with Dr.Branch concerning holding Eliquis for procedure. Per Dr.Branch Eliquis is to be held 3 days prior to procedure. New letter sent to patient and to Dr.Thompson.

## 2023-01-11 DIAGNOSIS — N183 Chronic kidney disease, stage 3 unspecified: Secondary | ICD-10-CM | POA: Diagnosis not present

## 2023-01-11 DIAGNOSIS — I700 Atherosclerosis of aorta: Secondary | ICD-10-CM | POA: Diagnosis not present

## 2023-01-11 DIAGNOSIS — I480 Paroxysmal atrial fibrillation: Secondary | ICD-10-CM | POA: Diagnosis not present

## 2023-01-11 DIAGNOSIS — D4102 Neoplasm of uncertain behavior of left kidney: Secondary | ICD-10-CM | POA: Diagnosis not present

## 2023-01-11 DIAGNOSIS — I130 Hypertensive heart and chronic kidney disease with heart failure and stage 1 through stage 4 chronic kidney disease, or unspecified chronic kidney disease: Secondary | ICD-10-CM | POA: Diagnosis not present

## 2023-01-11 DIAGNOSIS — E1122 Type 2 diabetes mellitus with diabetic chronic kidney disease: Secondary | ICD-10-CM | POA: Diagnosis not present

## 2023-01-11 DIAGNOSIS — Z483 Aftercare following surgery for neoplasm: Secondary | ICD-10-CM | POA: Diagnosis not present

## 2023-01-11 DIAGNOSIS — I5022 Chronic systolic (congestive) heart failure: Secondary | ICD-10-CM | POA: Diagnosis not present

## 2023-01-11 DIAGNOSIS — E1142 Type 2 diabetes mellitus with diabetic polyneuropathy: Secondary | ICD-10-CM | POA: Diagnosis not present

## 2023-01-14 ENCOUNTER — Other Ambulatory Visit: Payer: Self-pay | Admitting: Family

## 2023-01-14 ENCOUNTER — Ambulatory Visit: Payer: Medicare PPO | Admitting: Nurse Practitioner

## 2023-01-14 DIAGNOSIS — N390 Urinary tract infection, site not specified: Secondary | ICD-10-CM | POA: Diagnosis not present

## 2023-01-14 DIAGNOSIS — R399 Unspecified symptoms and signs involving the genitourinary system: Secondary | ICD-10-CM | POA: Diagnosis not present

## 2023-01-14 DIAGNOSIS — I700 Atherosclerosis of aorta: Secondary | ICD-10-CM | POA: Diagnosis not present

## 2023-01-14 DIAGNOSIS — I130 Hypertensive heart and chronic kidney disease with heart failure and stage 1 through stage 4 chronic kidney disease, or unspecified chronic kidney disease: Secondary | ICD-10-CM | POA: Diagnosis not present

## 2023-01-14 DIAGNOSIS — D4102 Neoplasm of uncertain behavior of left kidney: Secondary | ICD-10-CM | POA: Diagnosis not present

## 2023-01-14 DIAGNOSIS — I5022 Chronic systolic (congestive) heart failure: Secondary | ICD-10-CM | POA: Diagnosis not present

## 2023-01-14 DIAGNOSIS — I480 Paroxysmal atrial fibrillation: Secondary | ICD-10-CM | POA: Diagnosis not present

## 2023-01-14 DIAGNOSIS — Z483 Aftercare following surgery for neoplasm: Secondary | ICD-10-CM | POA: Diagnosis not present

## 2023-01-14 DIAGNOSIS — E1122 Type 2 diabetes mellitus with diabetic chronic kidney disease: Secondary | ICD-10-CM | POA: Diagnosis not present

## 2023-01-14 DIAGNOSIS — E1142 Type 2 diabetes mellitus with diabetic polyneuropathy: Secondary | ICD-10-CM | POA: Diagnosis not present

## 2023-01-14 DIAGNOSIS — N183 Chronic kidney disease, stage 3 unspecified: Secondary | ICD-10-CM | POA: Diagnosis not present

## 2023-01-15 ENCOUNTER — Other Ambulatory Visit: Payer: Medicare PPO

## 2023-01-15 ENCOUNTER — Other Ambulatory Visit: Payer: Self-pay

## 2023-01-15 DIAGNOSIS — Z794 Long term (current) use of insulin: Secondary | ICD-10-CM | POA: Diagnosis not present

## 2023-01-15 DIAGNOSIS — E1165 Type 2 diabetes mellitus with hyperglycemia: Secondary | ICD-10-CM

## 2023-01-16 DIAGNOSIS — I130 Hypertensive heart and chronic kidney disease with heart failure and stage 1 through stage 4 chronic kidney disease, or unspecified chronic kidney disease: Secondary | ICD-10-CM | POA: Diagnosis not present

## 2023-01-16 DIAGNOSIS — I480 Paroxysmal atrial fibrillation: Secondary | ICD-10-CM | POA: Diagnosis not present

## 2023-01-16 DIAGNOSIS — I5022 Chronic systolic (congestive) heart failure: Secondary | ICD-10-CM | POA: Diagnosis not present

## 2023-01-16 DIAGNOSIS — Z483 Aftercare following surgery for neoplasm: Secondary | ICD-10-CM | POA: Diagnosis not present

## 2023-01-16 DIAGNOSIS — N183 Chronic kidney disease, stage 3 unspecified: Secondary | ICD-10-CM | POA: Diagnosis not present

## 2023-01-16 DIAGNOSIS — E1142 Type 2 diabetes mellitus with diabetic polyneuropathy: Secondary | ICD-10-CM | POA: Diagnosis not present

## 2023-01-16 DIAGNOSIS — D4102 Neoplasm of uncertain behavior of left kidney: Secondary | ICD-10-CM | POA: Diagnosis not present

## 2023-01-16 DIAGNOSIS — E1122 Type 2 diabetes mellitus with diabetic chronic kidney disease: Secondary | ICD-10-CM | POA: Diagnosis not present

## 2023-01-16 DIAGNOSIS — I700 Atherosclerosis of aorta: Secondary | ICD-10-CM | POA: Diagnosis not present

## 2023-01-21 DIAGNOSIS — I700 Atherosclerosis of aorta: Secondary | ICD-10-CM | POA: Diagnosis not present

## 2023-01-21 DIAGNOSIS — E1122 Type 2 diabetes mellitus with diabetic chronic kidney disease: Secondary | ICD-10-CM | POA: Diagnosis not present

## 2023-01-21 DIAGNOSIS — D4102 Neoplasm of uncertain behavior of left kidney: Secondary | ICD-10-CM | POA: Diagnosis not present

## 2023-01-21 DIAGNOSIS — N183 Chronic kidney disease, stage 3 unspecified: Secondary | ICD-10-CM | POA: Diagnosis not present

## 2023-01-21 DIAGNOSIS — I5022 Chronic systolic (congestive) heart failure: Secondary | ICD-10-CM | POA: Diagnosis not present

## 2023-01-21 DIAGNOSIS — Z483 Aftercare following surgery for neoplasm: Secondary | ICD-10-CM | POA: Diagnosis not present

## 2023-01-21 DIAGNOSIS — I130 Hypertensive heart and chronic kidney disease with heart failure and stage 1 through stage 4 chronic kidney disease, or unspecified chronic kidney disease: Secondary | ICD-10-CM | POA: Diagnosis not present

## 2023-01-21 DIAGNOSIS — E1142 Type 2 diabetes mellitus with diabetic polyneuropathy: Secondary | ICD-10-CM | POA: Diagnosis not present

## 2023-01-21 DIAGNOSIS — I480 Paroxysmal atrial fibrillation: Secondary | ICD-10-CM | POA: Diagnosis not present

## 2023-01-21 LAB — CORTISOL, URINE, 24 HOUR
24 Hour urine volume (VMAHVA): 3650 mL
CREATININE, URINE: 1.53 g/(24.h) (ref 0.50–2.15)
Cortisol (Ur), Free: 60.3 ug/(24.h) — ABNORMAL HIGH (ref 4.0–50.0)

## 2023-01-22 IMAGING — US IR THORACENTESIS ASP PLEURAL SPACE W/IMG GUIDE
1 series · 2 of 2 positions shown · non-contrast
Comparison: none

INDICATION: History of CHF, left lower lobe pneumonia, left pleural effusion
status post left thoracentesis on 05/14/2021. Chest x-ray yesterday
show increase in left pleural effusion. Request for therapeutic and
diagnostic thoracentesis.

[Series 1: ir (person_name)/(person_name) · 2 of 2 slices shown]
[im 1/2]
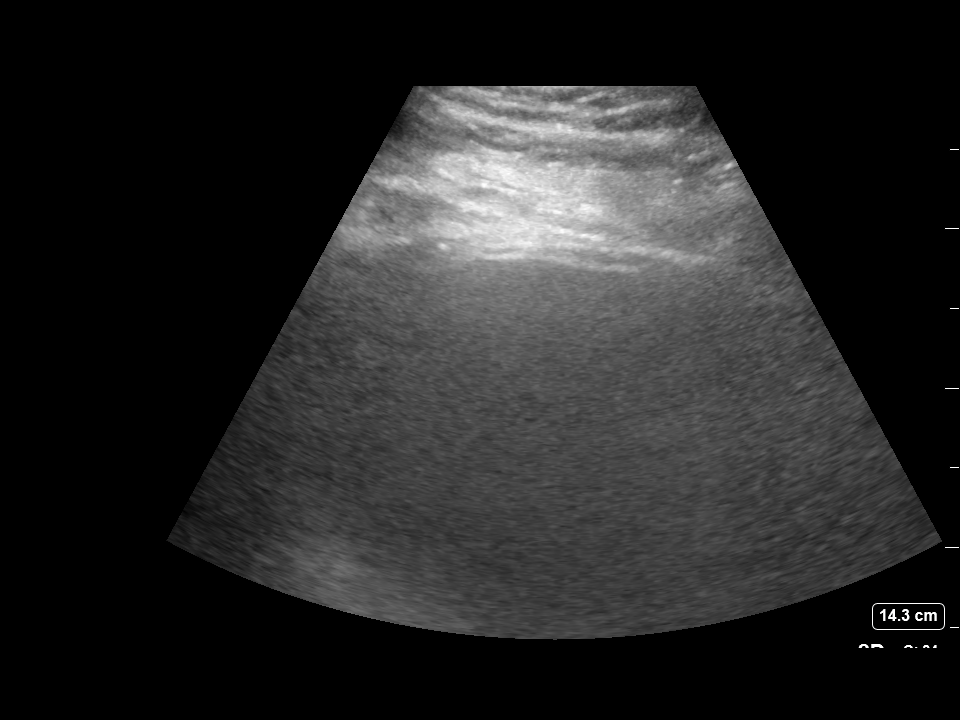
[im 2/2]
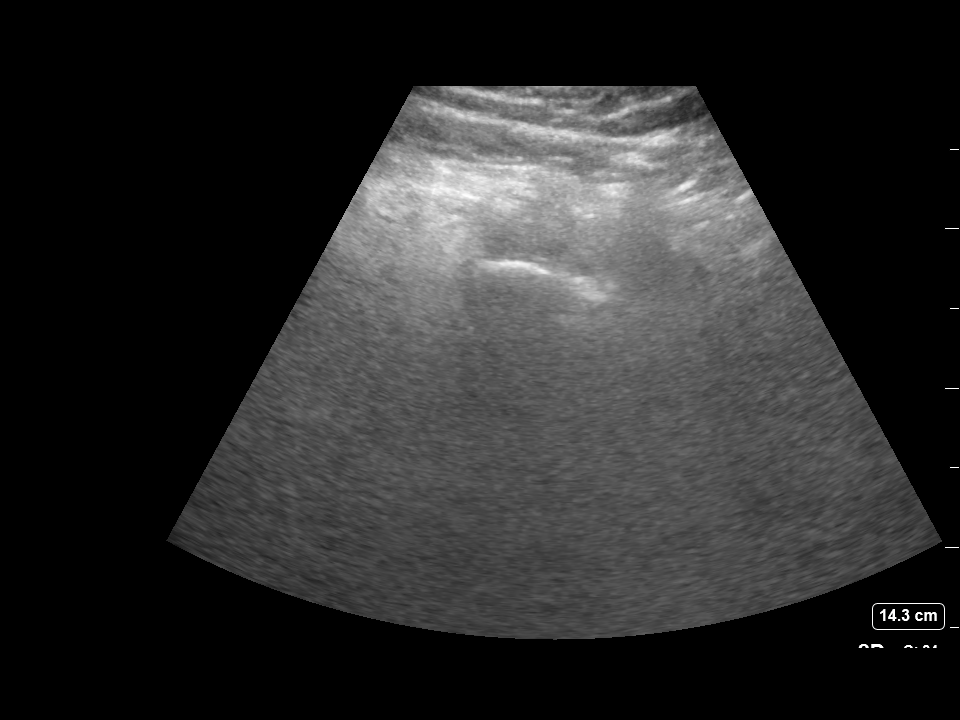

[2 of 2 positions shown; findings below may reference images not displayed]

EXAM:
ULTRASOUND GUIDED LEFT THORACENTESIS

MEDICATIONS:
10 mL 1% lidocaine

COMPLICATIONS:
None immediate.

PROCEDURE:
An ultrasound guided thoracentesis was thoroughly discussed with the
patient and questions answered. The benefits, risks, alternatives
and complications were also discussed. The patient understands and
wishes to proceed with the procedure. Written consent was obtained.

Ultrasound was performed to localize and mark an adequate pocket of
fluid in the left chest. The area was then prepped and draped in the
normal sterile fashion. 1% Lidocaine was used for local anesthesia.
Under ultrasound guidance a 6 Fr Safe-T-Centesis catheter was
introduced. Thoracentesis was performed. The catheter was removed
and a dressing applied.
FINDINGS: A total of approximately 2 L of hazy amber fluid was removed.
Samples were sent to the laboratory as requested by the clinical
team.

Post procedure chest X-ray reviewed, negative for pneumothorax.
IMPRESSION: Successful ultrasound guided left thoracentesis yielding 2 L of
pleural fluid.

## 2023-01-22 IMAGING — DX DG CHEST 1V
1 series · 1 of 1 positions shown · non-contrast
Comparison: 05/17/2021

CLINICAL DATA: Status post left thoracentesis

EXAM:
CHEST  1 VIEW

[chest ap]
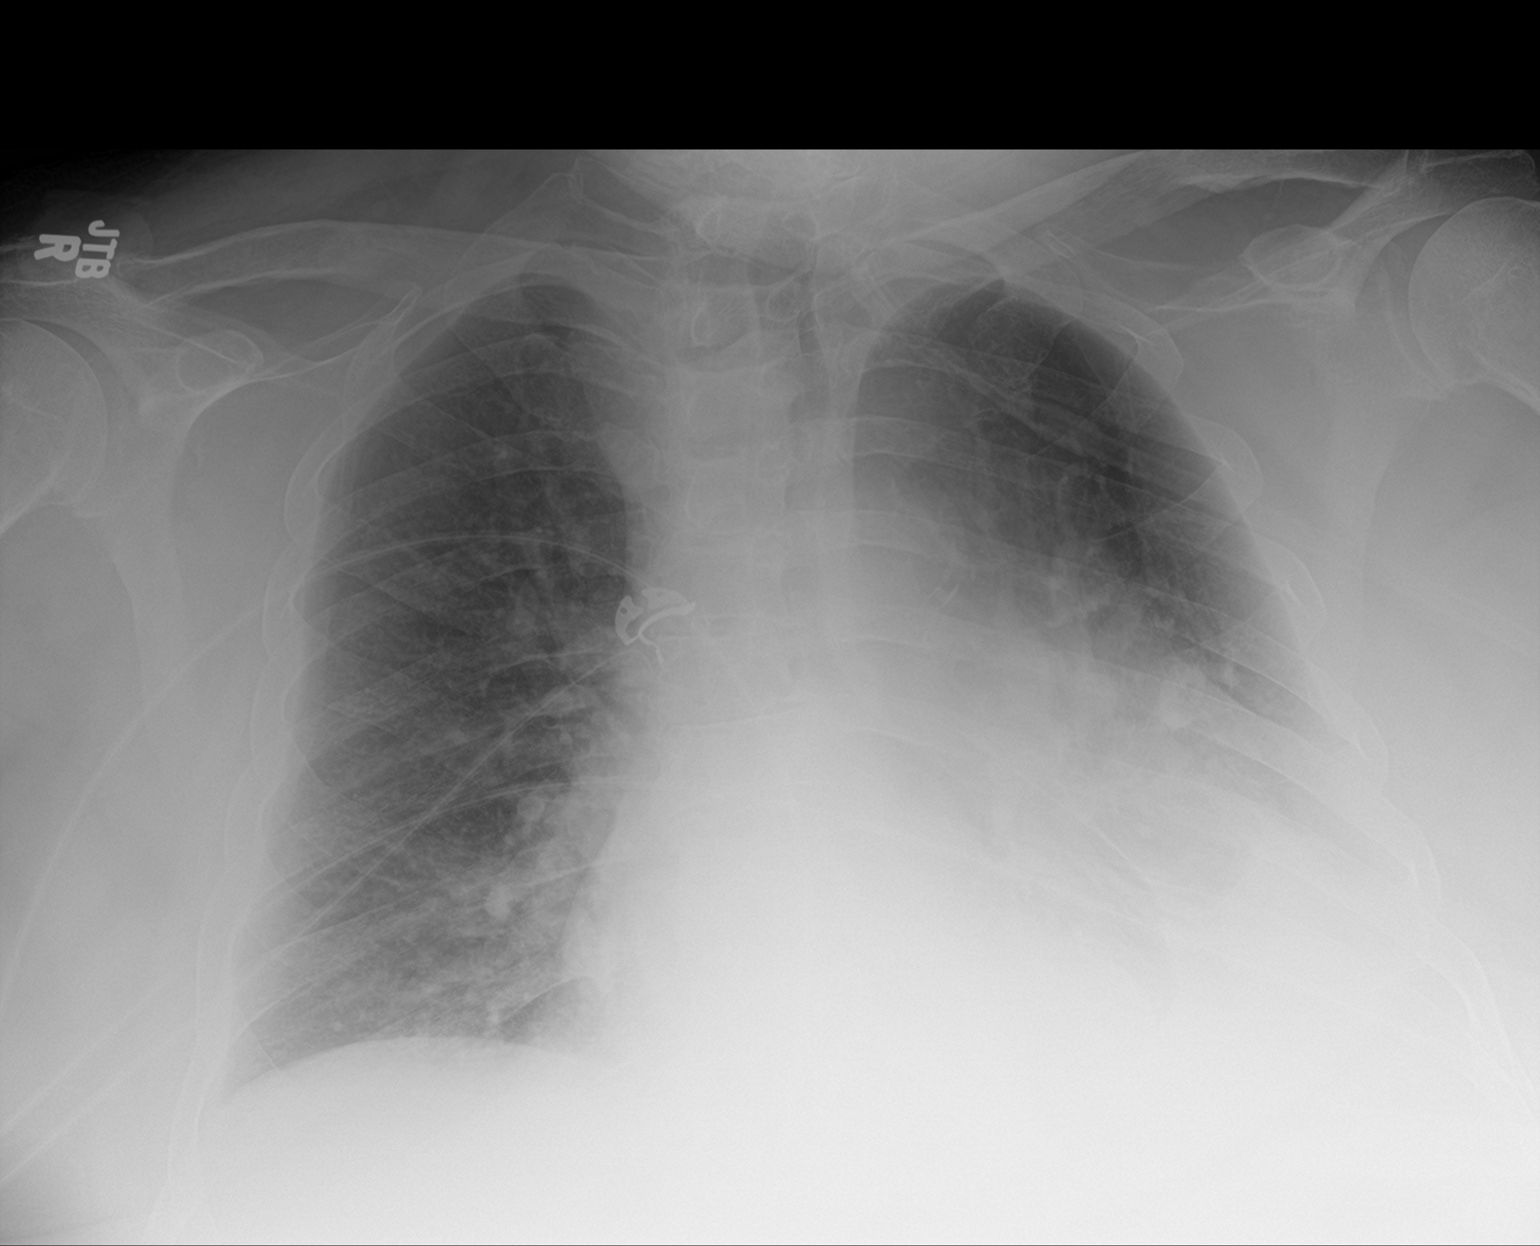

[1 of 1 positions shown; findings below may reference images not displayed]

FINDINGS: Transverse diameter of heart is increased. There is decrease in the
left pleural effusion. There is residual smaller left pleural
effusion. There is improvement in aeration in the left lower lung
fields which may be due to decrease in pleural effusion and possibly
decrease in underlying infiltrates. There are no signs of alveolar
pulmonary edema. There is no pneumothorax.
IMPRESSION: There is decrease in left pleural effusion. There is no
pneumothorax.

## 2023-01-23 ENCOUNTER — Telehealth: Payer: Self-pay | Admitting: Internal Medicine

## 2023-01-23 DIAGNOSIS — I5022 Chronic systolic (congestive) heart failure: Secondary | ICD-10-CM | POA: Diagnosis not present

## 2023-01-23 DIAGNOSIS — E1122 Type 2 diabetes mellitus with diabetic chronic kidney disease: Secondary | ICD-10-CM | POA: Diagnosis not present

## 2023-01-23 DIAGNOSIS — E1142 Type 2 diabetes mellitus with diabetic polyneuropathy: Secondary | ICD-10-CM | POA: Diagnosis not present

## 2023-01-23 DIAGNOSIS — I700 Atherosclerosis of aorta: Secondary | ICD-10-CM | POA: Diagnosis not present

## 2023-01-23 DIAGNOSIS — I480 Paroxysmal atrial fibrillation: Secondary | ICD-10-CM | POA: Diagnosis not present

## 2023-01-23 DIAGNOSIS — Z483 Aftercare following surgery for neoplasm: Secondary | ICD-10-CM | POA: Diagnosis not present

## 2023-01-23 DIAGNOSIS — N183 Chronic kidney disease, stage 3 unspecified: Secondary | ICD-10-CM | POA: Diagnosis not present

## 2023-01-23 DIAGNOSIS — D4102 Neoplasm of uncertain behavior of left kidney: Secondary | ICD-10-CM | POA: Diagnosis not present

## 2023-01-23 DIAGNOSIS — I130 Hypertensive heart and chronic kidney disease with heart failure and stage 1 through stage 4 chronic kidney disease, or unspecified chronic kidney disease: Secondary | ICD-10-CM | POA: Diagnosis not present

## 2023-01-23 IMAGING — CR DG CHEST 2V
2 series · 2 of 2 positions shown · non-contrast
Comparison: Chest x-ray 05/18/2021

CLINICAL DATA: Chest pain

EXAM:
CHEST - 2 VIEW

[chest lat]
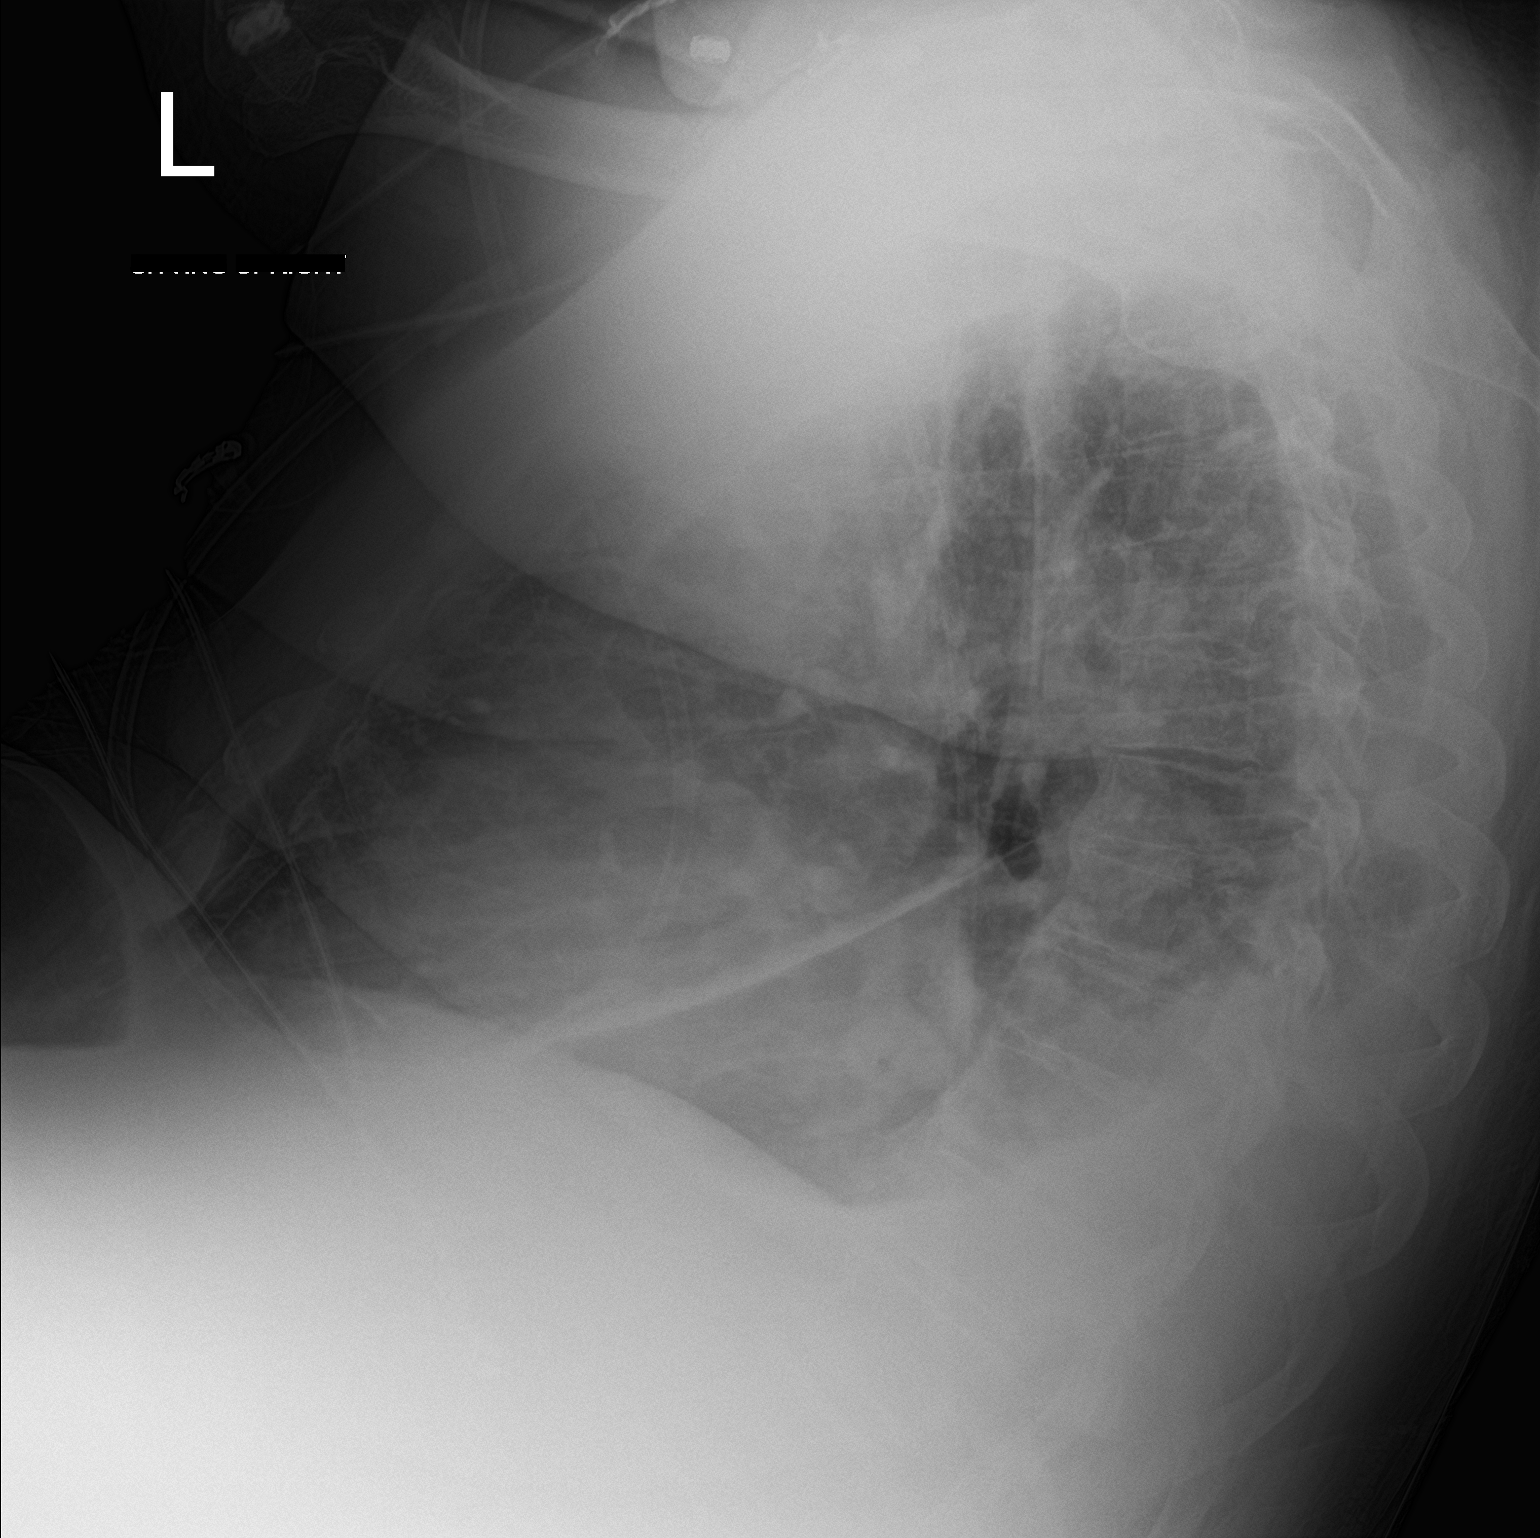

[chest ap]
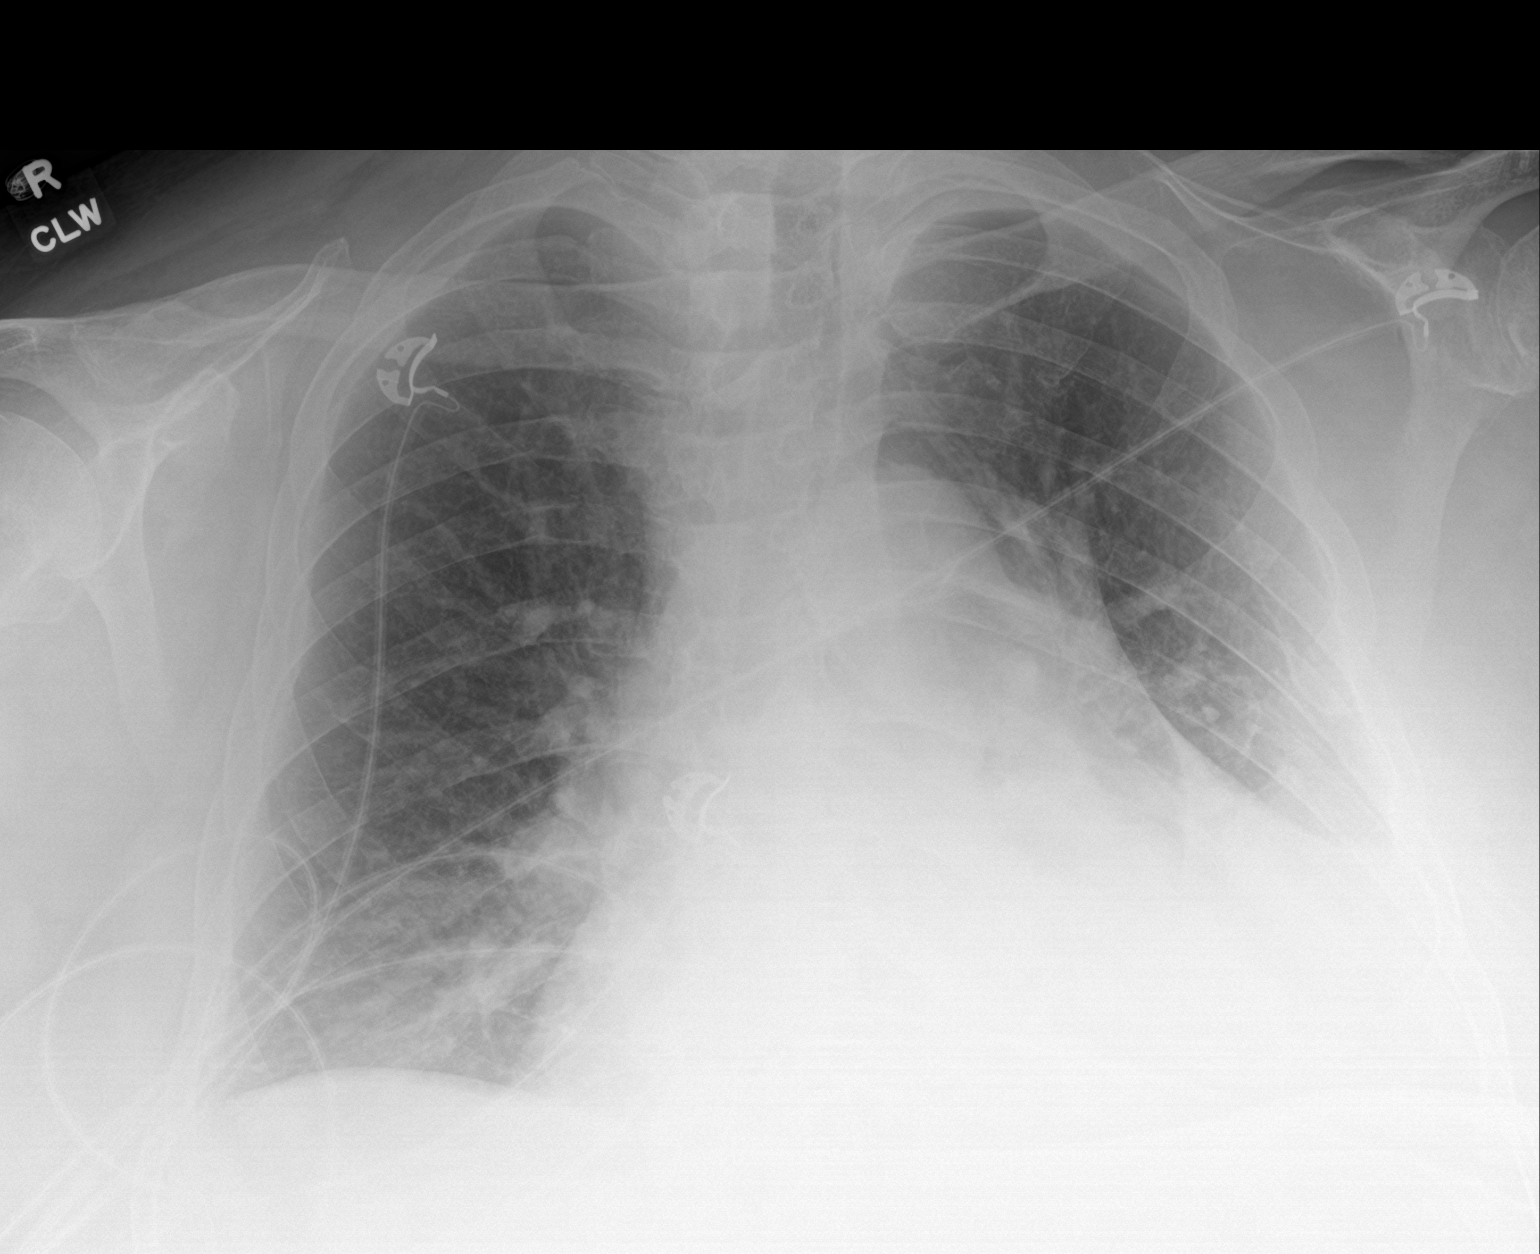

[2 of 2 positions shown; findings below may reference images not displayed]

FINDINGS: Persistent enlarged cardiac silhouette. The heart and mediastinal
contours are unchanged. Aortic calcification.

No focal consolidation. Increased interstitial markings. Slight
interval decrease in size of an at least small volume left pleural
effusion. Likely trace right pleural effusion. No pneumothorax.

No acute osseous abnormality.
IMPRESSION: 1. Cardiomegaly with pulmonary edema.
2. Slight interval decrease in size of an at least small volume left
pleural effusion.
3. Likely trace right pleural effusion.
4.  Aortic Atherosclerosis (G37S6-N61.1).

## 2023-01-23 MED ORDER — DEXAMETHASONE 1 MG PO TABS
1.0000 mg | ORAL_TABLET | Freq: Once | ORAL | 0 refills | Status: AC
Start: 2023-01-23 — End: 2023-01-23

## 2023-01-23 NOTE — Telephone Encounter (Signed)
Spoke to the patient on 12//2024 at 1500   Discussed elevated 24-hour urinary cortisol.  Will proceed with dexamethasone suppression test... Patient notified to take this at 11:30 PM the night prior to testing  Patient will need to have a fasting cortisol 8 AM the following morning.   Patient would like to have this drawn by [facility]  home nurse   Will have to contact them and figure out the logistics  Dexamethasone 1 mg tablet was sent to the pharmacy

## 2023-01-24 NOTE — Telephone Encounter (Signed)
Patient will actually have test on Monday and states that he will take tablet at 11pm Sunday night and have labs drawn at 7:30am Monday due to having an appointment at 8:15 Monday morning.

## 2023-01-24 NOTE — Telephone Encounter (Signed)
Left message with the answer service to have someone give a callback to provide information

## 2023-01-24 NOTE — Telephone Encounter (Signed)
Spoke with Marylene Land at [facility] and she took verbal for the fasting Cortisol and will faxed result over once completed. The nurse will go out next Wednesday and perform test.

## 2023-01-25 ENCOUNTER — Other Ambulatory Visit: Payer: Self-pay | Admitting: Orthopedic Surgery

## 2023-01-25 DIAGNOSIS — G4733 Obstructive sleep apnea (adult) (pediatric): Secondary | ICD-10-CM | POA: Diagnosis not present

## 2023-01-25 NOTE — Telephone Encounter (Signed)
Patient advise okay to take tablet at 11 pm and draw blood at 7:30 am.

## 2023-01-28 DIAGNOSIS — Z483 Aftercare following surgery for neoplasm: Secondary | ICD-10-CM | POA: Diagnosis not present

## 2023-01-28 DIAGNOSIS — N183 Chronic kidney disease, stage 3 unspecified: Secondary | ICD-10-CM | POA: Diagnosis not present

## 2023-01-28 DIAGNOSIS — I5022 Chronic systolic (congestive) heart failure: Secondary | ICD-10-CM | POA: Diagnosis not present

## 2023-01-28 DIAGNOSIS — D4102 Neoplasm of uncertain behavior of left kidney: Secondary | ICD-10-CM | POA: Diagnosis not present

## 2023-01-28 DIAGNOSIS — E1122 Type 2 diabetes mellitus with diabetic chronic kidney disease: Secondary | ICD-10-CM | POA: Diagnosis not present

## 2023-01-28 DIAGNOSIS — E1142 Type 2 diabetes mellitus with diabetic polyneuropathy: Secondary | ICD-10-CM | POA: Diagnosis not present

## 2023-01-28 DIAGNOSIS — I480 Paroxysmal atrial fibrillation: Secondary | ICD-10-CM | POA: Diagnosis not present

## 2023-01-28 DIAGNOSIS — E1165 Type 2 diabetes mellitus with hyperglycemia: Secondary | ICD-10-CM | POA: Diagnosis not present

## 2023-01-28 DIAGNOSIS — I700 Atherosclerosis of aorta: Secondary | ICD-10-CM | POA: Diagnosis not present

## 2023-01-28 DIAGNOSIS — I130 Hypertensive heart and chronic kidney disease with heart failure and stage 1 through stage 4 chronic kidney disease, or unspecified chronic kidney disease: Secondary | ICD-10-CM | POA: Diagnosis not present

## 2023-01-29 DIAGNOSIS — H2513 Age-related nuclear cataract, bilateral: Secondary | ICD-10-CM | POA: Diagnosis not present

## 2023-01-30 ENCOUNTER — Encounter: Payer: Self-pay | Admitting: Internal Medicine

## 2023-01-30 DIAGNOSIS — I130 Hypertensive heart and chronic kidney disease with heart failure and stage 1 through stage 4 chronic kidney disease, or unspecified chronic kidney disease: Secondary | ICD-10-CM | POA: Diagnosis not present

## 2023-01-30 DIAGNOSIS — I480 Paroxysmal atrial fibrillation: Secondary | ICD-10-CM | POA: Diagnosis not present

## 2023-01-30 DIAGNOSIS — E1142 Type 2 diabetes mellitus with diabetic polyneuropathy: Secondary | ICD-10-CM | POA: Diagnosis not present

## 2023-01-30 DIAGNOSIS — D4102 Neoplasm of uncertain behavior of left kidney: Secondary | ICD-10-CM | POA: Diagnosis not present

## 2023-01-30 DIAGNOSIS — I700 Atherosclerosis of aorta: Secondary | ICD-10-CM | POA: Diagnosis not present

## 2023-01-30 DIAGNOSIS — I5022 Chronic systolic (congestive) heart failure: Secondary | ICD-10-CM | POA: Diagnosis not present

## 2023-01-30 DIAGNOSIS — Z483 Aftercare following surgery for neoplasm: Secondary | ICD-10-CM | POA: Diagnosis not present

## 2023-01-30 DIAGNOSIS — E1122 Type 2 diabetes mellitus with diabetic chronic kidney disease: Secondary | ICD-10-CM | POA: Diagnosis not present

## 2023-01-30 DIAGNOSIS — N183 Chronic kidney disease, stage 3 unspecified: Secondary | ICD-10-CM | POA: Diagnosis not present

## 2023-01-31 ENCOUNTER — Encounter (HOSPITAL_COMMUNITY): Payer: Self-pay | Admitting: Orthopedic Surgery

## 2023-01-31 ENCOUNTER — Other Ambulatory Visit: Payer: Self-pay

## 2023-01-31 NOTE — Progress Notes (Signed)
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  Last seen in the A-fib clinic on 12/26/2022 by Lake Bells, PA-C.  Per note, in NSR at that time, device report showed <1% A-fib burden.  It was also noted that he recently undergone left renal tumor cryoablation and biopsy at [facility] on 12/11/2022.  No changes made to cardiology management, 59-month follow-up recommended.  Per telephone encounter 01/10/2023, Dr. Wyline Mood advised patient can hold Eliquis 3 days prior to procedure.  LD Eliquis 01/31/23. LD Victoza 01/31/23.  Follows with endocrinology for history of IDDM 2, managed on insulin pump.  A1c 7.5 on 01/03/2023.  Other pertinent history includes CKD 3B, anemia, multiple bowel surgeries s/p colectomy and end ileostomy, debility (wheelchair dependent), GERD, super morbid obesity (BMI 50).  Patient will need day of surgery labs and evaluation.  EKG 12/26/22: AV PACED RHYTHM with occasional Premature atrial complexes. Rate 81.  TTE 11/02/2021:  1. Left ventricular ejection fraction, by estimation, is 70 to 75%. Left  ventricular ejection fraction by 2D MOD biplane is 71.7 %. The left  ventricle has hyperdynamic function. The left ventricle has no regional  wall motion abnormalities. There is  severe left ventricular hypertrophy. Left ventricular diastolic parameters  are consistent with Grade I diastolic dysfunction (impaired relaxation).   2. Right ventricular systolic function is normal. The right ventricular  size is normal.   3. Left atrial size was moderately dilated.   4. Right atrial size was moderately dilated.   5. The mitral valve was not well visualized. No  evidence of mitral valve  regurgitation.   6. The aortic valve was not well visualized. Aortic valve regurgitation  is not visualized.   7. Aortic dilatation noted. There is mild dilatation of the aortic root,  measuring 43 mm.   8. Agitated saline contrast bubble study was negative, with no evidence  of any interatrial shunt.   Comparison(s): Changes from prior study are noted. 05/14/2021: LVEF 60-65%,  aortic root dilated to 41 mm.      Zannie Cove [facility] Short Stay Center/Anesthesiology Phone [phone] 01/31/2023 4:20 PM

## 2023-01-31 NOTE — Anesthesia Preprocedure Evaluation (Addendum)
Anesthesia Evaluation  Patient identified by MRN, date of birth, ID band Patient awake    Reviewed: Allergy & Precautions, NPO status , Patient's Chart, lab work & pertinent test results  Airway Mallampati: II  TM Distance: >3 FB Neck ROM: Full    Dental no notable dental hx. (+) Teeth Intact, Dental Advisory Given   Pulmonary sleep apnea (2L O2 day and night), Continuous Positive Airway Pressure Ventilation and Oxygen sleep apnea , former smoker   Pulmonary exam normal breath sounds clear to auscultation       Cardiovascular hypertension, Pt. on medications +CHF  Normal cardiovascular exam+ dysrhythmias Atrial Fibrillation + pacemaker + Valvular Problems/Murmurs (mild/mod MS, mild AS)  Rhythm:Regular Rate:Normal  TTE 2023 1. Left ventricular ejection fraction, by estimation, is 60 to 65%. The  left ventricle has normal function. The left ventricle has no regional  wall motion abnormalities. There is mild left ventricular hypertrophy.  Left ventricular diastolic parameters  are indeterminate.   2. Right ventricular systolic function was not well visualized. The right  ventricular size is moderately enlarged.   3. Left atrial size was moderately dilated.   4. Right atrial size was mildly dilated.   5. The mitral valve is normal in structure. No evidence of mitral valve  regurgitation. Mild to moderate mitral stenosis. The mean mitral valve  gradient is 5.0 mmHg with average heart rate of 64 bpm.   6. The aortic valve was not well visualized. Aortic valve regurgitation  is not visualized. Mild aortic valve stenosis.   7. Aortic dilatation noted. There is dilatation of the aortic root,  measuring 41 mm.   8. The inferior vena cava is dilated in size with <50% respiratory  variability, suggesting right atrial pressure of 15 mmHg.     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)  Renal/GU Renal InsufficiencyRenal disease  negative genitourinary   Musculoskeletal  (+) Arthritis ,    Abdominal   Peds  Hematology  (+) Blood dyscrasia (eliquis), anemia   Anesthesia Other Findings   Reproductive/Obstetrics                             Anesthesia Physical Anesthesia Plan  ASA: 4  Anesthesia Plan: MAC   Post-op Pain Management: Tylenol PO (pre-op)*   Induction: Intravenous  PONV Risk Score and Plan: Propofol infusion, Treatment may vary due to age or medical condition and Ondansetron  Airway Management Planned: Natural Airway  Additional Equipment:   Intra-op Plan:   Post-operative Plan:   Informed Consent: I have reviewed the patients History and Physical, chart, labs and discussed the procedure including the risks, benefits and alternatives for the proposed anesthesia with the patient or authorized representative who has indicated his/her understanding and acceptance.     Dental advisory given  Plan Discussed with: CRNA  Anesthesia Plan Comments: ( )        Anesthesia Quick Evaluation

## 2023-01-31 NOTE — Progress Notes (Signed)
PCP - Dr. Richarda Blade  Cardiologist - Dr. Carolan Clines EP- Dr. Sherryl Manges  PPM/ICD - Pacemaker Abbott Device Orders - requested 12/12 Rep Notified - n/a (awaiting orders)  Chest x-ray - 02/09/22 EKG - 12/26/22 Stress Test - pt states he had a chemical stress test this year, no record found ECHO - 11/02/21 Cardiac Cath - denies  CPAP - nightly, pressure settings 12.5  Fasting Blood Sugar - 150-200 Checks Blood Sugar continuously (pt has sensor)   Last dose Victoza was 01/31/23. Anesthesia PA aware  Blood Thinner Instructions: Hold Eliquis 3 days. Pt took last dose on 12/12 Aspirin Instructions: n/a  ERAS Protcol - clears until 0730  COVID TEST- n/a  Anesthesia review: yes, pacemaker, colostomy, insulin pump, O2 Dependent 2L, CPAP  Patient verbally denies any shortness of breath, fever, cough and chest pain during phone call   -------------  SDW INSTRUCTIONS given:  Your procedure is scheduled on 12/16.  Report to [facility] Main Entrance "A" at 0800 A.M., and check in at the Admitting office.  Call this number if you have problems the morning of surgery:  [phone]   Remember:  Do not eat after midnight the night before your surgery  You may drink clear liquids until 0730 AM the morning of your surgery.   Clear liquids allowed are: Water, Non-Citrus Juices (without pulp), Carbonated Beverages, Clear Tea, Black Coffee Only, and Gatorade    Take these medicines the morning of surgery with A SIP OF WATER   Tylenol PRN, Alfuzosin, Xanax PRN, Duloxetine, Imdur, Robaxin PRN, Zofran PRN, Protonix, Crestor, Topamax, Tramadol PRN   As of today, STOP taking any Aspirin (unless otherwise instructed by your surgeon) Aleve, Naproxen, Ibuprofen, Motrin, Advil, Goody's, BC's, all herbal medications, fish oil, and all vitamins.                      Do not wear jewelry, make up, or nail polish            Do not wear lotions, powders, perfumes/colognes, or deodorant.             Do not shave 48 hours prior to surgery.  Men may shave face and neck.            Do not bring valuables to the hospital.            [facility] is not responsible for any belongings or valuables.  Do NOT Smoke (Tobacco/Vaping) 24 hours prior to your procedure If you use a CPAP at night, you may bring all equipment for your overnight stay.   Contacts, glasses, dentures or bridgework may not be worn into surgery.      For patients admitted to the hospital, discharge time will be determined by your treatment team.   Patients discharged the day of surgery will not be allowed to drive home, and someone needs to stay with them for 24 hours.  Diabetes coordinator notified about insulin pump Pt is wheelchair bound and will need a [place] Pt is not taking Mounjaro yet. He is finishing the doses he has left of his Victoza.    Special instructions:   [place] [facility]- Preparing For Surgery  Before surgery, you can play an important role. Because skin is not sterile, your skin needs to be as free of germs as possible. You can reduce the number of germs on your skin by washing with CHG (chlorahexidine gluconate) Soap before surgery.  CHG is an antiseptic cleaner  which kills germs and bonds with the skin to continue killing germs even after washing.    Oral Hygiene is also important to reduce your risk of infection.  Remember - BRUSH YOUR TEETH THE MORNING OF SURGERY WITH YOUR REGULAR TOOTHPASTE  Please do not use if you have an allergy to CHG or antibacterial soaps. If your skin becomes reddened/irritated stop using the CHG.  Do not shave (including legs and underarms) for at least 48 hours prior to first CHG shower. It is OK to shave your face.  Please follow these instructions carefully.   Shower the NIGHT BEFORE SURGERY and the MORNING OF SURGERY with DIAL Soap.   Pat yourself dry with a CLEAN TOWEL.  Wear CLEAN PAJAMAS to bed the night before surgery  Place CLEAN SHEETS on your bed the night  of your first shower and DO NOT SLEEP WITH PETS.   Day of Surgery: Please shower morning of surgery  Wear Clean/Comfortable clothing the morning of surgery Do not apply any deodorants/lotions.   Remember to brush your teeth WITH YOUR REGULAR TOOTHPASTE.   Questions were answered. Patient verbalized understanding of instructions.

## 2023-01-31 NOTE — Progress Notes (Addendum)
Requested device orders from [facility] for pt's upcoming surgery on 12/16. RN with [facility] responded and said they would not be able to get this pt in office for about 2 weeks to be able to do a device check and provide device orders.

## 2023-02-01 NOTE — H&P (Signed)
  Primary Care Provider: [facility] Referring Provider: None Worker's Comp: No Date of Injury or Onset: Chronic  History: CC / Reason for Visit: Right index finger HPI: This patient is a 72 year old, morbidly obese, retired Programmer, systems who indicates that he has severe pain of his right index finger as well as distal burning.  He has a history of having renal cell carcinoma for which he was just treated and reports that he was told that there was no metastasis into his bones.  He has a history of having his left long finger MP joint fused, which he indicates causes him trouble with gripping and grasping.  He denies any type of rheumatoid arthritis.  He does have neuropathy.  He is a diabetic.  Past medical history, past surgical history, family history, social history, medications, allergies and review of systems are thoroughly reviewed by me, signed and scanned into SRS today.  The patient is otherwise healthy.  Exam:  Vitals: Refer to EMR. Constitutional:  WD, WN, NAD HEENT:  NCAT, EOMI Neuro/Psych:  Alert & oriented to person, place, and time; appropriate mood & affect Lymphatic: No generalized UE edema or lymphadenopathy Extremities / MSK:  Both UE are normal with respect to appearance, ranges of motion, joint stability, muscle strength/tone, sensation, & perfusion except as otherwise noted:  Right index finger PIP joint is enlarged with decreased range of motion, achieving perhaps 30 of flexion which is painful.  There is tenderness to palpation on both ulnar and radial aspects as well as volar and dorsal.  Any forced flexion is very painful.  There is also some ulnar drift distally.  DIP is not tender to palpate.  It is capable of good motion.  There is no pain with forced flexion.  Light touch is intact.  On both ulnar and radial aspects as well as on the pad.  There is a scar indicating an open carpal tunnel release performed in the past.  Labs / Xrays:  3 views of the right  index finger ordered and obtained today demonstrate osteoarthritic changes with ulnar collapse With some resulting angular deformity and a oval-shaped cystic change in the distal portion of the PIP.   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 We discussed surgical options that include arthrodesis.  We will confer with his oncologist, and determine whether there is sufficient concern for the possibility of metastasis to obtain tissue sample at the time of arthrodesis.  Goals, risks, and options were reviewed and consent obtained.

## 2023-02-04 ENCOUNTER — Other Ambulatory Visit: Payer: Self-pay

## 2023-02-04 ENCOUNTER — Ambulatory Visit (HOSPITAL_COMMUNITY): Payer: Medicare PPO | Admitting: Physician Assistant

## 2023-02-04 ENCOUNTER — Ambulatory Visit (HOSPITAL_COMMUNITY): Payer: Medicare PPO

## 2023-02-04 ENCOUNTER — Ambulatory Visit (HOSPITAL_BASED_OUTPATIENT_CLINIC_OR_DEPARTMENT_OTHER): Payer: Medicare PPO | Admitting: Physician Assistant

## 2023-02-04 ENCOUNTER — Encounter (HOSPITAL_COMMUNITY)
Admission: RE | Disposition: A | Discharge status: Home / Self Care | Payer: Self-pay | Source: Home / Self Care | Attending: Orthopedic Surgery

## 2023-02-04 ENCOUNTER — Encounter (HOSPITAL_COMMUNITY): Payer: Self-pay | Admitting: Orthopedic Surgery

## 2023-02-04 ENCOUNTER — Ambulatory Visit (HOSPITAL_COMMUNITY)
Admission: RE | Admit: 2023-02-04 | Discharge: 2023-02-04 | Disposition: A | Discharge status: Home / Self Care | Payer: Medicare PPO | Attending: Orthopedic Surgery | Admitting: Orthopedic Surgery

## 2023-02-04 DIAGNOSIS — Z950 Presence of cardiac pacemaker: Secondary | ICD-10-CM | POA: Insufficient documentation

## 2023-02-04 DIAGNOSIS — M1990 Unspecified osteoarthritis, unspecified site: Secondary | ICD-10-CM | POA: Diagnosis not present

## 2023-02-04 DIAGNOSIS — I350 Nonrheumatic aortic (valve) stenosis: Secondary | ICD-10-CM | POA: Insufficient documentation

## 2023-02-04 DIAGNOSIS — E1122 Type 2 diabetes mellitus with diabetic chronic kidney disease: Secondary | ICD-10-CM | POA: Diagnosis not present

## 2023-02-04 DIAGNOSIS — I130 Hypertensive heart and chronic kidney disease with heart failure and stage 1 through stage 4 chronic kidney disease, or unspecified chronic kidney disease: Secondary | ICD-10-CM | POA: Insufficient documentation

## 2023-02-04 DIAGNOSIS — F32A Depression, unspecified: Secondary | ICD-10-CM | POA: Insufficient documentation

## 2023-02-04 DIAGNOSIS — Z794 Long term (current) use of insulin: Secondary | ICD-10-CM | POA: Diagnosis not present

## 2023-02-04 DIAGNOSIS — N189 Chronic kidney disease, unspecified: Secondary | ICD-10-CM | POA: Diagnosis not present

## 2023-02-04 DIAGNOSIS — Z87891 Personal history of nicotine dependence: Secondary | ICD-10-CM | POA: Insufficient documentation

## 2023-02-04 DIAGNOSIS — I4891 Unspecified atrial fibrillation: Secondary | ICD-10-CM | POA: Insufficient documentation

## 2023-02-04 DIAGNOSIS — K219 Gastro-esophageal reflux disease without esophagitis: Secondary | ICD-10-CM | POA: Diagnosis not present

## 2023-02-04 DIAGNOSIS — I509 Heart failure, unspecified: Secondary | ICD-10-CM | POA: Insufficient documentation

## 2023-02-04 DIAGNOSIS — Z9641 Presence of insulin pump (external) (internal): Secondary | ICD-10-CM | POA: Insufficient documentation

## 2023-02-04 DIAGNOSIS — M19041 Primary osteoarthritis, right hand: Secondary | ICD-10-CM | POA: Insufficient documentation

## 2023-02-04 DIAGNOSIS — D631 Anemia in chronic kidney disease: Secondary | ICD-10-CM | POA: Insufficient documentation

## 2023-02-04 DIAGNOSIS — Z7984 Long term (current) use of oral hypoglycemic drugs: Secondary | ICD-10-CM | POA: Diagnosis not present

## 2023-02-04 DIAGNOSIS — Z6841 Body Mass Index (BMI) 40.0 and over, adult: Secondary | ICD-10-CM | POA: Insufficient documentation

## 2023-02-04 DIAGNOSIS — I129 Hypertensive chronic kidney disease with stage 1 through stage 4 chronic kidney disease, or unspecified chronic kidney disease: Secondary | ICD-10-CM | POA: Diagnosis not present

## 2023-02-04 DIAGNOSIS — R519 Headache, unspecified: Secondary | ICD-10-CM | POA: Insufficient documentation

## 2023-02-04 DIAGNOSIS — F419 Anxiety disorder, unspecified: Secondary | ICD-10-CM | POA: Insufficient documentation

## 2023-02-04 DIAGNOSIS — N184 Chronic kidney disease, stage 4 (severe): Secondary | ICD-10-CM | POA: Diagnosis not present

## 2023-02-04 DIAGNOSIS — G4730 Sleep apnea, unspecified: Secondary | ICD-10-CM | POA: Diagnosis not present

## 2023-02-04 DIAGNOSIS — Z9689 Presence of other specified functional implants: Secondary | ICD-10-CM | POA: Diagnosis not present

## 2023-02-04 DIAGNOSIS — I050 Rheumatic mitral stenosis: Secondary | ICD-10-CM | POA: Diagnosis not present

## 2023-02-04 DIAGNOSIS — E1140 Type 2 diabetes mellitus with diabetic neuropathy, unspecified: Secondary | ICD-10-CM | POA: Insufficient documentation

## 2023-02-04 HISTORY — DX: Presence of cardiac pacemaker: Z95.0

## 2023-02-04 HISTORY — DX: Unspecified osteoarthritis, unspecified site: M19.90

## 2023-02-04 HISTORY — DX: Chronic kidney disease, stage 3b: N18.32

## 2023-02-04 HISTORY — DX: Headache, unspecified: R51.9

## 2023-02-04 HISTORY — DX: Transient cerebral ischemic attack, unspecified: G45.9

## 2023-02-04 HISTORY — DX: Basal cell carcinoma of skin, unspecified: C44.91

## 2023-02-04 HISTORY — DX: Personal history of urinary calculi: Z87.442

## 2023-02-04 HISTORY — DX: Anemia, unspecified: D64.9

## 2023-02-04 HISTORY — DX: Malignant neoplasm of left kidney, except renal pelvis: C64.2

## 2023-02-04 HISTORY — PX: FINGER ARTHRODESIS: SHX5000

## 2023-02-04 LAB — BASIC METABOLIC PANEL
Anion gap: 11 (ref 5–15)
BUN: 35 mg/dL — ABNORMAL HIGH (ref 8–23)
CO2: 23 mmol/L (ref 22–32)
Calcium: 8.4 mg/dL — ABNORMAL LOW (ref 8.9–10.3)
Chloride: 98 mmol/L (ref 98–111)
Creatinine, Ser: 2.19 mg/dL — ABNORMAL HIGH (ref 0.61–1.24)
GFR, Estimated: 31 mL/min — ABNORMAL LOW (ref >60)
Glucose, Bld: 196 mg/dL — ABNORMAL HIGH (ref 70–99)
Potassium: 3 mmol/L — ABNORMAL LOW (ref 3.5–5.1)
Sodium: 132 mmol/L — ABNORMAL LOW (ref 135–145)

## 2023-02-04 LAB — CBC
HCT: 37.8 % — ABNORMAL LOW (ref 39.0–52.0)
Hemoglobin: 11.9 g/dL — ABNORMAL LOW (ref 13.0–17.0)
MCH: 27.8 pg (ref 26.0–34.0)
MCHC: 31.5 g/dL (ref 30.0–36.0)
MCV: 88.3 fL (ref 80.0–100.0)
Platelets: 223 10*3/uL (ref 150–400)
RBC: 4.28 MIL/uL (ref 4.22–5.81)
RDW: 17.7 % — ABNORMAL HIGH (ref 11.5–15.5)
WBC: 6.7 10*3/uL (ref 4.0–10.5)
nRBC: 0 % (ref 0.0–0.2)

## 2023-02-04 LAB — GLUCOSE, CAPILLARY: Glucose-Capillary: 213 mg/dL — ABNORMAL HIGH (ref 70–99)

## 2023-02-04 SURGERY — FUSION, JOINT, FINGER
Anesthesia: Monitor Anesthesia Care | Site: Finger | Laterality: Right

## 2023-02-04 MED ORDER — PHENYLEPHRINE HCL-NACL 20-0.9 MG/250ML-% IV SOLN
INTRAVENOUS | Status: DC | PRN
  Administered 2023-02-04: 40 ug/min via INTRAVENOUS

## 2023-02-04 MED ORDER — MIDAZOLAM HCL 2 MG/2ML IJ SOLN
INTRAMUSCULAR | Status: AC
  Filled 2023-02-04: qty 2

## 2023-02-04 MED ORDER — PROPOFOL 500 MG/50ML IV EMUL
INTRAVENOUS | Status: DC | PRN
  Administered 2023-02-04: 15 ug/kg/min via INTRAVENOUS

## 2023-02-04 MED ORDER — SODIUM CHLORIDE 0.9 % IV SOLN: INTRAVENOUS | Status: DC | PRN

## 2023-02-04 MED ORDER — SODIUM CHLORIDE 0.9 % IV SOLN
INTRAVENOUS | Status: AC
  Filled 2023-02-04: qty 3

## 2023-02-04 MED ORDER — ACETAMINOPHEN 500 MG PO TABS: 1000.0000 mg | ORAL_TABLET | Freq: Once | ORAL | Status: AC

## 2023-02-04 MED ORDER — SODIUM CHLORIDE 0.9 % IV SOLN
3.0000 g | INTRAVENOUS | Status: AC
  Administered 2023-02-04: 3 g via INTRAVENOUS

## 2023-02-04 MED ORDER — LIDOCAINE-EPINEPHRINE 1 %-1:100000 IJ SOLN
INTRAMUSCULAR | Status: DC | PRN
  Administered 2023-02-04: 9 mL

## 2023-02-04 MED ORDER — CHLORHEXIDINE GLUCONATE 0.12 % MT SOLN
15.0000 mL | OROMUCOSAL | Status: AC
  Filled 2023-02-04: qty 15

## 2023-02-04 MED ORDER — PROPOFOL 10 MG/ML IV BOLUS
INTRAVENOUS | Status: DC | PRN
  Administered 2023-02-04 (×2): 50 mg via INTRAVENOUS

## 2023-02-04 MED ORDER — DEXMEDETOMIDINE HCL IN NACL 80 MCG/20ML IV SOLN
INTRAVENOUS | Status: DC | PRN
  Administered 2023-02-04: 8 ug via INTRAVENOUS
  Administered 2023-02-04: 12 ug via INTRAVENOUS
  Administered 2023-02-04: 8 ug via INTRAVENOUS
  Administered 2023-02-04: 20 ug via INTRAVENOUS
  Administered 2023-02-04: 8 ug via INTRAVENOUS
  Administered 2023-02-04 (×2): 12 ug via INTRAVENOUS

## 2023-02-04 MED ORDER — LIDOCAINE-EPINEPHRINE 1 %-1:100000 IJ SOLN
INTRAMUSCULAR | Status: AC
  Filled 2023-02-04: qty 1

## 2023-02-04 MED ORDER — FENTANYL CITRATE (PF) 100 MCG/2ML IJ SOLN: 25.0000 ug | INTRAMUSCULAR | Status: DC | PRN

## 2023-02-04 MED ORDER — ACETAMINOPHEN 325 MG PO TABS
650.0000 mg | ORAL_TABLET | Freq: Four times a day (QID) | ORAL | Status: AC
Start: 2023-02-04 — End: ?

## 2023-02-04 MED ORDER — SODIUM BICARBONATE 8.4 % IV SOLN
INTRAVENOUS | Status: AC
Start: 2023-02-04 — End: ?
  Filled 2023-02-04: qty 50

## 2023-02-04 MED ORDER — CHLORHEXIDINE GLUCONATE 0.12 % MT SOLN
OROMUCOSAL | Status: AC
  Administered 2023-02-04: 15 mL via OROMUCOSAL
  Filled 2023-02-04: qty 15

## 2023-02-04 MED ORDER — FENTANYL CITRATE (PF) 250 MCG/5ML IJ SOLN
INTRAMUSCULAR | Status: AC
  Filled 2023-02-04: qty 5

## 2023-02-04 MED ORDER — PROPOFOL 10 MG/ML IV BOLUS
INTRAVENOUS | Status: AC
  Filled 2023-02-04: qty 20

## 2023-02-04 MED ORDER — DEXMEDETOMIDINE HCL IN NACL 80 MCG/20ML IV SOLN
INTRAVENOUS | Status: AC
Start: 2023-02-04 — End: ?
  Filled 2023-02-04: qty 20

## 2023-02-04 MED ORDER — OXYCODONE HCL 5 MG PO TABS: 5.0000 mg | ORAL_TABLET | Freq: Four times a day (QID) | ORAL | 0 refills | Status: DC | PRN

## 2023-02-04 MED ORDER — ACETAMINOPHEN 500 MG PO TABS
ORAL_TABLET | ORAL | Status: AC
  Administered 2023-02-04: 1000 mg via ORAL
  Filled 2023-02-04: qty 2

## 2023-02-04 SURGICAL SUPPLY — 35 items
BAG COUNTER SPONGE SURGICOUNT (BAG) ×2 IMPLANT
BNDG COHESIVE 1X5 TAN STRL LF (GAUZE/BANDAGES/DRESSINGS) IMPLANT
BNDG ESMARK 4X9 LF (GAUZE/BANDAGES/DRESSINGS) ×2 IMPLANT
BNDG STRETCH GAUZE 3IN X12FT (GAUZE/BANDAGES/DRESSINGS) IMPLANT
CORD BIPOLAR FORCEPS 12FT (ELECTRODE) ×2 IMPLANT
COVER SURGICAL LIGHT HANDLE (MISCELLANEOUS) ×4 IMPLANT
CUFF TOURN SGL QUICK 18X4 (TOURNIQUET CUFF) ×2 IMPLANT
DRAPE U-SHAPE 47X51 STRL (DRAPES) ×2 IMPLANT
DRSG ADAPTIC 3X8 NADH LF (GAUZE/BANDAGES/DRESSINGS) IMPLANT
DURAPREP 26ML APPLICATOR (WOUND CARE) ×2 IMPLANT
ELECT REM PT RETURN 9FT ADLT (ELECTROSURGICAL) ×1 IMPLANT
ELECTRODE REM PT RTRN 9FT ADLT (ELECTROSURGICAL) ×2 IMPLANT
GAUZE SPONGE 4X4 12PLY STRL (GAUZE/BANDAGES/DRESSINGS) IMPLANT
GLOVE BIOGEL PI IND STRL 9 (GLOVE) ×2 IMPLANT
GLOVE SURG ORTHO 9.0 STRL STRW (GLOVE) ×2 IMPLANT
GOWN STRL REUS W/ TWL XL LVL3 (GOWN DISPOSABLE) ×4 IMPLANT
GUIDEWIRE 0.9MM (WIRE) IMPLANT
KIT BASIN OR (CUSTOM PROCEDURE TRAY) ×2 IMPLANT
KIT TURNOVER KIT B (KITS) ×2 IMPLANT
MANIFOLD NEPTUNE II (INSTRUMENTS) ×2 IMPLANT
NS IRRIG 1000ML POUR BTL (IV SOLUTION) ×2 IMPLANT
PACK ORTHO EXTREMITY (CUSTOM PROCEDURE TRAY) ×2 IMPLANT
PAD ARMBOARD 7.5X6 YLW CONV (MISCELLANEOUS) ×4 IMPLANT
RASP DISTAL SURGICAL 10 (MISCELLANEOUS) IMPLANT
RASP PROX 10MM (DRILL) IMPLANT
REAMER XPOST (DRILL) IMPLANT
SCREW LAG THRD APEX 3X22 (Screw) IMPLANT
SCREW POST APEX MED 4X20 30D (Screw) IMPLANT
SPECIMEN JAR SMALL (MISCELLANEOUS) ×2 IMPLANT
SPLINT FINGER 5.25 ALUM (SOFTGOODS) ×1 IMPLANT
SPLINT FINGER 5/8X5.25 (SOFTGOODS) IMPLANT
SUT VICRYL RAPIDE 4/0 PS 2 (SUTURE) IMPLANT
TOWEL GREEN STERILE (TOWEL DISPOSABLE) ×2 IMPLANT
TOWEL GREEN STERILE FF (TOWEL DISPOSABLE) ×2 IMPLANT
WATER STERILE IRR 1000ML POUR (IV SOLUTION) ×2 IMPLANT

## 2023-02-04 NOTE — Discharge Instructions (Addendum)
Discharge Instructions   You have a dressing with a plaster splint incorporated in it. Move your fingers as much as possible, making a full fist and fully opening the fist. Elevate your hand to reduce pain & swelling of the digits.  Ice over the operative site may be helpful to reduce pain & swelling.  DO NOT USE HEAT. Take Tylenol 650 mg every 6 hours. Take the prescribed medicine as needed for severe post operative pain management. Leave the dressing in place until you return to our office.  You may shower, but keep the bandage clean & dry.  You may drive a car when you are off of prescription pain medications and can safely control your vehicle with both hands. Our office will call you to arrange follow-up   Please call [phone] during normal business hours or [phone] after hours for any problems. Including the following:  - excessive redness of the incisions - drainage for more than 4 days - fever of more than 101.5 F  *Please note that pain medications will not be refilled after hours or on weekends.

## 2023-02-04 NOTE — Anesthesia Postprocedure Evaluation (Signed)
Anesthesia Post Note  Patient: Jesse [name]  Procedure(s) Performed: RIGHT INDEX FINGER PROXIMAL INTERPHALANGEAL JOINT ARTHRODESIS (Right: Finger)     Patient location during evaluation: PACU Anesthesia Type: MAC Level of consciousness: awake and alert Pain management: pain level controlled Vital Signs Assessment: post-procedure vital signs reviewed and stable Respiratory status: spontaneous breathing, nonlabored ventilation, respiratory function stable and patient connected to nasal cannula oxygen Cardiovascular status: stable and blood pressure returned to baseline Postop Assessment: no apparent nausea or vomiting Anesthetic complications: no  No notable events documented.  Last Vitals:  Vitals:   02/04/23 1230 02/04/23 1300  BP: (!) 97/57 107/64  Pulse: 71   Resp: 16   Temp: 36.5 C   SpO2: 94%     Last Pain:  Vitals:   02/04/23 1200  TempSrc:   PainSc: 0-No pain                 Helvi Royals L Desia Saban

## 2023-02-04 NOTE — Op Note (Signed)
02/04/2023  10:38 AM  PATIENT:  Jesse [name]  72 y.o. 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 Janee Morn, MD  PHYSICIAN ASSISTANT: Danielle Rankin, OPA-C  ANESTHESIA:   digital block/MAC  SPECIMENS:  None  DRAINS:   None  EBL:   less than 10 mL  PREOPERATIVE INDICATIONS:  Jesse [name] is a  72 y.o. male with painful advanced R IF PIP arthritis, with minimal ROM  The risks benefits and alternatives were discussed with the patient preoperatively including but not limited to the risks of infection, bleeding, nerve injury, cardiopulmonary complications, the need for revision surgery, among others, and the patient verbalized understanding and consented to proceed.  OPERATIVE IMPLANTS: Extremity Medical APEX 30 degree PIP fusion device  OPERATIVE PROCEDURE:  After receiving prophylactic antibiotics, the patient was escorted to the operative theatre and placed in a supine position.  Digital block with buffered lidocaine was performed by me.  A surgical "time-out" was performed during which the planned procedure, proposed operative site, and the correct patient identity were compared to the operative consent and agreement confirmed by the circulating nurse according to current facility policy.  Following application of a tourniquet to the operative extremity, the exposed skin was prepped with Chloraprep and draped in the usual sterile fashion.  The limb was exsanguinated with an Esmarch bandage and the tourniquet inflated to approximately higher than systolic BP.  A dorsal linear incision was made sharply with a scalpel.  Subcutaneous tissues elevated as a full-thickness flap.  The extensor apparatus was split midline and reflected radially and ulnarly.  The PIP joint was entered in this fashion, and rondure used to clean thickened degenerative synovium from the neck and the joint  surfaces.  The collateral ligaments were elevated off the head of the proximal phalanx with a scalpel.  The joint was then shot-gunned to expose it.  There was advanced degenerative change noted without any healthy cartilage remaining.  The guidepin for the device was placed into the proximal phalanx and confirmed fluoroscopically.  This was followed by the entry reamer, and then the tap.  The longer 20 mm medium proximal implant was placed.  Using a rondure, the distal palmar portion of the head of the proximal phalanx was removed in this manner and then the planer was used to further prepare the surface.  Distally the K wire was placed again under fluoroscopic guidance followed by planar.  We selected a 22 mm screw.  Once the joint surfaces were prepared, the wounds were irrigated and when the joint was held in apposition, there was still more radial angulation then I preferred, so some additional morselized graft was placed on the ulnar side which helped to correct it.  We aim for just the slightest bit of ulnar angulation to allow this digit to better match the alignment of the adjacent digits.  With that in the 30 degree position and appropriately rotated, the thumb tip and index fingertip could be brought together.  The distal implant was placed, holding the fusion in close apposition and securing it to 2 finger tightness, engaging the [facility] taper.  Final images were obtained and saved.  The wound was irrigated and then a rondure used to further help shape the PIP joint contour.  The extensor apparatus that had been split in the midline was reapproximated with 4-0 Vicryl interrupted suture, followed by 4-0 Vicryl Rapide running  horizontal mattress suture for the skin.  A hand-based dressing was applied with a dorsal aluminum splint for the index finger itself and he was taken to the recovery room.  DISPOSITION: He will be discharged home today with typical instructions, returning for reevaluation on the  26th, with new x-rays of the right index finger out of splint and a following hand therapy appointment for custom splint fabrication and initiation of rehab.  If this is unavailable we will place him back into his aluminum splint until it becomes available.

## 2023-02-04 NOTE — Interval H&P Note (Signed)
History and Physical Interval Note:  02/04/2023 10:38 AM  Jesse [name]  has presented today for surgery, with the diagnosis of RIGHT INDEX FINGER ARTHRITIS.  The various methods of treatment have been discussed with the patient and family. After consideration of risks, benefits and other options for treatment, the patient has consented to  Procedure(s): RIGHT INDEX FINGER PROXIMAL INTERPHALANGEAL JOINT ARTHRODESIS (Right) as a surgical intervention.  The patient's history has been reviewed, patient examined, no change in status, stable for surgery.  I have reviewed the patient's chart and labs.  Questions were answered to the patient's satisfaction.     Jodi Marble

## 2023-02-04 NOTE — Transfer of Care (Signed)
Immediate Anesthesia Transfer of Care Note  Patient: Jesse [name]  Procedure(s) Performed: RIGHT INDEX FINGER PROXIMAL INTERPHALANGEAL JOINT ARTHRODESIS (Right: Finger)  Patient Location: PACU  Anesthesia Type:MAC  Level of Consciousness: awake, alert , patient cooperative, and responds to stimulation  Airway & Oxygen Therapy: Patient Spontanous Breathing and Patient connected to face mask oxygen  Post-op Assessment: Report given to RN, Post -op Vital signs reviewed and stable, and Patient moving all extremities X 4  Post vital signs: Reviewed and stable  Last Vitals:  Vitals Value Taken Time  BP    Temp 36.6 C 02/04/23 1200  Pulse 69 02/04/23 1206  Resp 16 02/04/23 1206  SpO2 95 % 02/04/23 1206  Vitals shown include unfiled device data.  Last Pain:  Vitals:   02/04/23 0919  TempSrc:   PainSc: 2       Patients Stated Pain Goal: 0 (02/04/23 0919)  Complications: No notable events documented.

## 2023-02-04 NOTE — Anesthesia Procedure Notes (Signed)
Date/Time: 02/04/2023 10:43 AM  Performed by: Shary Decamp, CRNAPre-anesthesia Checklist: Patient identified, Emergency Drugs available, Suction available, Timeout performed and Patient being monitored Patient Re-evaluated:Patient Re-evaluated prior to induction Oxygen Delivery Method: Simple face mask

## 2023-02-05 ENCOUNTER — Encounter (HOSPITAL_COMMUNITY): Payer: Self-pay | Admitting: Orthopedic Surgery

## 2023-02-05 DIAGNOSIS — I700 Atherosclerosis of aorta: Secondary | ICD-10-CM | POA: Diagnosis not present

## 2023-02-05 DIAGNOSIS — E1122 Type 2 diabetes mellitus with diabetic chronic kidney disease: Secondary | ICD-10-CM | POA: Diagnosis not present

## 2023-02-05 DIAGNOSIS — R399 Unspecified symptoms and signs involving the genitourinary system: Secondary | ICD-10-CM | POA: Diagnosis not present

## 2023-02-05 DIAGNOSIS — N183 Chronic kidney disease, stage 3 unspecified: Secondary | ICD-10-CM | POA: Diagnosis not present

## 2023-02-05 DIAGNOSIS — N390 Urinary tract infection, site not specified: Secondary | ICD-10-CM | POA: Diagnosis not present

## 2023-02-05 DIAGNOSIS — I130 Hypertensive heart and chronic kidney disease with heart failure and stage 1 through stage 4 chronic kidney disease, or unspecified chronic kidney disease: Secondary | ICD-10-CM | POA: Diagnosis not present

## 2023-02-05 DIAGNOSIS — I5022 Chronic systolic (congestive) heart failure: Secondary | ICD-10-CM | POA: Diagnosis not present

## 2023-02-05 DIAGNOSIS — Z483 Aftercare following surgery for neoplasm: Secondary | ICD-10-CM | POA: Diagnosis not present

## 2023-02-05 DIAGNOSIS — I480 Paroxysmal atrial fibrillation: Secondary | ICD-10-CM | POA: Diagnosis not present

## 2023-02-05 DIAGNOSIS — D4102 Neoplasm of uncertain behavior of left kidney: Secondary | ICD-10-CM | POA: Diagnosis not present

## 2023-02-05 DIAGNOSIS — E1142 Type 2 diabetes mellitus with diabetic polyneuropathy: Secondary | ICD-10-CM | POA: Diagnosis not present

## 2023-02-06 DIAGNOSIS — I5022 Chronic systolic (congestive) heart failure: Secondary | ICD-10-CM | POA: Diagnosis not present

## 2023-02-06 DIAGNOSIS — D4102 Neoplasm of uncertain behavior of left kidney: Secondary | ICD-10-CM | POA: Diagnosis not present

## 2023-02-06 DIAGNOSIS — I130 Hypertensive heart and chronic kidney disease with heart failure and stage 1 through stage 4 chronic kidney disease, or unspecified chronic kidney disease: Secondary | ICD-10-CM | POA: Diagnosis not present

## 2023-02-06 DIAGNOSIS — K56609 Unspecified intestinal obstruction, unspecified as to partial versus complete obstruction: Secondary | ICD-10-CM | POA: Diagnosis not present

## 2023-02-06 DIAGNOSIS — Z933 Colostomy status: Secondary | ICD-10-CM | POA: Diagnosis not present

## 2023-02-06 DIAGNOSIS — N183 Chronic kidney disease, stage 3 unspecified: Secondary | ICD-10-CM | POA: Diagnosis not present

## 2023-02-06 DIAGNOSIS — E1122 Type 2 diabetes mellitus with diabetic chronic kidney disease: Secondary | ICD-10-CM | POA: Diagnosis not present

## 2023-02-06 DIAGNOSIS — I480 Paroxysmal atrial fibrillation: Secondary | ICD-10-CM | POA: Diagnosis not present

## 2023-02-06 DIAGNOSIS — E118 Type 2 diabetes mellitus with unspecified complications: Secondary | ICD-10-CM | POA: Diagnosis not present

## 2023-02-06 DIAGNOSIS — I700 Atherosclerosis of aorta: Secondary | ICD-10-CM | POA: Diagnosis not present

## 2023-02-06 DIAGNOSIS — Z483 Aftercare following surgery for neoplasm: Secondary | ICD-10-CM | POA: Diagnosis not present

## 2023-02-06 DIAGNOSIS — E1142 Type 2 diabetes mellitus with diabetic polyneuropathy: Secondary | ICD-10-CM | POA: Diagnosis not present

## 2023-02-06 DIAGNOSIS — E119 Type 2 diabetes mellitus without complications: Secondary | ICD-10-CM | POA: Diagnosis not present

## 2023-02-06 DIAGNOSIS — Z794 Long term (current) use of insulin: Secondary | ICD-10-CM | POA: Diagnosis not present

## 2023-02-07 DIAGNOSIS — I480 Paroxysmal atrial fibrillation: Secondary | ICD-10-CM | POA: Diagnosis not present

## 2023-02-07 DIAGNOSIS — I130 Hypertensive heart and chronic kidney disease with heart failure and stage 1 through stage 4 chronic kidney disease, or unspecified chronic kidney disease: Secondary | ICD-10-CM | POA: Diagnosis not present

## 2023-02-07 DIAGNOSIS — E1142 Type 2 diabetes mellitus with diabetic polyneuropathy: Secondary | ICD-10-CM | POA: Diagnosis not present

## 2023-02-07 DIAGNOSIS — N183 Chronic kidney disease, stage 3 unspecified: Secondary | ICD-10-CM | POA: Diagnosis not present

## 2023-02-07 DIAGNOSIS — E1122 Type 2 diabetes mellitus with diabetic chronic kidney disease: Secondary | ICD-10-CM | POA: Diagnosis not present

## 2023-02-07 DIAGNOSIS — Z483 Aftercare following surgery for neoplasm: Secondary | ICD-10-CM | POA: Diagnosis not present

## 2023-02-07 DIAGNOSIS — D4102 Neoplasm of uncertain behavior of left kidney: Secondary | ICD-10-CM | POA: Diagnosis not present

## 2023-02-07 DIAGNOSIS — I5022 Chronic systolic (congestive) heart failure: Secondary | ICD-10-CM | POA: Diagnosis not present

## 2023-02-07 DIAGNOSIS — I700 Atherosclerosis of aorta: Secondary | ICD-10-CM | POA: Diagnosis not present

## 2023-02-08 DIAGNOSIS — E119 Type 2 diabetes mellitus without complications: Secondary | ICD-10-CM | POA: Diagnosis not present

## 2023-02-08 DIAGNOSIS — Z933 Colostomy status: Secondary | ICD-10-CM | POA: Diagnosis not present

## 2023-02-08 DIAGNOSIS — Z794 Long term (current) use of insulin: Secondary | ICD-10-CM | POA: Diagnosis not present

## 2023-02-08 DIAGNOSIS — E118 Type 2 diabetes mellitus with unspecified complications: Secondary | ICD-10-CM | POA: Diagnosis not present

## 2023-02-08 DIAGNOSIS — K56609 Unspecified intestinal obstruction, unspecified as to partial versus complete obstruction: Secondary | ICD-10-CM | POA: Diagnosis not present

## 2023-02-11 ENCOUNTER — Telehealth: Payer: Self-pay

## 2023-02-11 NOTE — Telephone Encounter (Signed)
Spoke with Beth from [facility] wanted to ask Ngetich, [name] C, NP for verbal orders to extend contact to visit the home for few more weeks because patient is having UTI's and was taking the wrong antibiotics to treat the UTI, patient's Urology doctor has been contacted.  Also the Nurse also states that he had a procedure on the left kidney and that his kidney function has elevated. Please Advise  Message sent to Ngetich, [name], NP

## 2023-02-11 NOTE — Telephone Encounter (Signed)
Noted  

## 2023-02-11 NOTE — Telephone Encounter (Signed)
Noted. Patient has been keep updated with [facility] or chronic Kidney disease.

## 2023-02-11 NOTE — Telephone Encounter (Signed)
Okay to give [facility] verbal orders per [facility] policy.patient follows up with [facility] for chronic Kidney disease.

## 2023-02-14 DIAGNOSIS — M19041 Primary osteoarthritis, right hand: Secondary | ICD-10-CM | POA: Diagnosis not present

## 2023-02-17 DIAGNOSIS — N183 Chronic kidney disease, stage 3 unspecified: Secondary | ICD-10-CM | POA: Diagnosis not present

## 2023-02-17 DIAGNOSIS — E1142 Type 2 diabetes mellitus with diabetic polyneuropathy: Secondary | ICD-10-CM | POA: Diagnosis not present

## 2023-02-17 DIAGNOSIS — D4102 Neoplasm of uncertain behavior of left kidney: Secondary | ICD-10-CM | POA: Diagnosis not present

## 2023-02-17 DIAGNOSIS — E1122 Type 2 diabetes mellitus with diabetic chronic kidney disease: Secondary | ICD-10-CM | POA: Diagnosis not present

## 2023-02-17 DIAGNOSIS — I700 Atherosclerosis of aorta: Secondary | ICD-10-CM | POA: Diagnosis not present

## 2023-02-17 DIAGNOSIS — I5022 Chronic systolic (congestive) heart failure: Secondary | ICD-10-CM | POA: Diagnosis not present

## 2023-02-17 DIAGNOSIS — I480 Paroxysmal atrial fibrillation: Secondary | ICD-10-CM | POA: Diagnosis not present

## 2023-02-17 DIAGNOSIS — Z483 Aftercare following surgery for neoplasm: Secondary | ICD-10-CM | POA: Diagnosis not present

## 2023-02-17 DIAGNOSIS — I130 Hypertensive heart and chronic kidney disease with heart failure and stage 1 through stage 4 chronic kidney disease, or unspecified chronic kidney disease: Secondary | ICD-10-CM | POA: Diagnosis not present

## 2023-02-18 ENCOUNTER — Other Ambulatory Visit: Payer: Self-pay | Admitting: Family

## 2023-02-18 ENCOUNTER — Telehealth: Payer: Self-pay

## 2023-02-18 DIAGNOSIS — E559 Vitamin D deficiency, unspecified: Secondary | ICD-10-CM

## 2023-02-18 NOTE — Telephone Encounter (Signed)
Tanya nurse from [facility] called and states that she just did reverification on patient. She's requesting nursing once a week for 2 weeks, and once every two weeks for 6 weeks. This is for continued urinary issues, continues congestive heart failure with education, and medication management. Verbal orders given. Message routed to PCP Ngetich, [name], NP as [facility] Childes.

## 2023-02-18 NOTE — Telephone Encounter (Signed)
Noted  

## 2023-02-18 NOTE — Telephone Encounter (Signed)
High Risk Warning Populated when attempting to refill, I will send to Provider for further review 

## 2023-02-19 DIAGNOSIS — M25641 Stiffness of right hand, not elsewhere classified: Secondary | ICD-10-CM | POA: Diagnosis not present

## 2023-02-19 DIAGNOSIS — M19041 Primary osteoarthritis, right hand: Secondary | ICD-10-CM | POA: Diagnosis not present

## 2023-02-22 DIAGNOSIS — D4102 Neoplasm of uncertain behavior of left kidney: Secondary | ICD-10-CM | POA: Diagnosis not present

## 2023-02-22 DIAGNOSIS — I700 Atherosclerosis of aorta: Secondary | ICD-10-CM | POA: Diagnosis not present

## 2023-02-22 DIAGNOSIS — I480 Paroxysmal atrial fibrillation: Secondary | ICD-10-CM | POA: Diagnosis not present

## 2023-02-22 DIAGNOSIS — E1142 Type 2 diabetes mellitus with diabetic polyneuropathy: Secondary | ICD-10-CM | POA: Diagnosis not present

## 2023-02-22 DIAGNOSIS — I130 Hypertensive heart and chronic kidney disease with heart failure and stage 1 through stage 4 chronic kidney disease, or unspecified chronic kidney disease: Secondary | ICD-10-CM | POA: Diagnosis not present

## 2023-02-22 DIAGNOSIS — E1122 Type 2 diabetes mellitus with diabetic chronic kidney disease: Secondary | ICD-10-CM | POA: Diagnosis not present

## 2023-02-22 DIAGNOSIS — I5022 Chronic systolic (congestive) heart failure: Secondary | ICD-10-CM | POA: Diagnosis not present

## 2023-02-22 DIAGNOSIS — N183 Chronic kidney disease, stage 3 unspecified: Secondary | ICD-10-CM | POA: Diagnosis not present

## 2023-02-22 DIAGNOSIS — Z4789 Encounter for other orthopedic aftercare: Secondary | ICD-10-CM | POA: Diagnosis not present

## 2023-02-25 ENCOUNTER — Telehealth: Payer: Self-pay

## 2023-02-25 DIAGNOSIS — G4733 Obstructive sleep apnea (adult) (pediatric): Secondary | ICD-10-CM | POA: Diagnosis not present

## 2023-02-25 NOTE — Telephone Encounter (Signed)
 Noted.Peer to peer was scheduled for 11: 20 am but MD did not call.

## 2023-02-25 NOTE — Telephone Encounter (Signed)
 Message left on clinical intake voicemail:   Dr.MaryAnn called about 1 pm stating she was calling to do a peer to peer with Jesse [name], however she was unable to leave a message on Jesse [name]'s voicemail, so someone sent her to the clinical intake voicemail. Dr.Maryann did not initially leave a patients name and DOB.  I returned call to [facility] back, she provided me with the patients name and DOB, and also stated that unfortunately she needed to complete Peer-to-Peer by 1:30 pm or deny request for another 60 days of SNF care and Physical Therapy, in which she already denied as she did not see the need or documentation to support additional care.  Dr.MaryAnn stated that Jesse [name] will need to do an appeal if she feels that is necessary.

## 2023-02-26 ENCOUNTER — Telehealth: Payer: Self-pay

## 2023-02-26 DIAGNOSIS — I4819 Other persistent atrial fibrillation: Secondary | ICD-10-CM | POA: Diagnosis not present

## 2023-02-26 DIAGNOSIS — R2681 Unsteadiness on feet: Secondary | ICD-10-CM | POA: Diagnosis not present

## 2023-02-26 DIAGNOSIS — N183 Chronic kidney disease, stage 3 unspecified: Secondary | ICD-10-CM | POA: Diagnosis not present

## 2023-02-26 DIAGNOSIS — E1142 Type 2 diabetes mellitus with diabetic polyneuropathy: Secondary | ICD-10-CM | POA: Diagnosis not present

## 2023-02-26 DIAGNOSIS — D4102 Neoplasm of uncertain behavior of left kidney: Secondary | ICD-10-CM | POA: Diagnosis not present

## 2023-02-26 DIAGNOSIS — G63 Polyneuropathy in diseases classified elsewhere: Secondary | ICD-10-CM | POA: Diagnosis not present

## 2023-02-26 DIAGNOSIS — Z4789 Encounter for other orthopedic aftercare: Secondary | ICD-10-CM | POA: Diagnosis not present

## 2023-02-26 DIAGNOSIS — I130 Hypertensive heart and chronic kidney disease with heart failure and stage 1 through stage 4 chronic kidney disease, or unspecified chronic kidney disease: Secondary | ICD-10-CM | POA: Diagnosis not present

## 2023-02-26 DIAGNOSIS — I5022 Chronic systolic (congestive) heart failure: Secondary | ICD-10-CM | POA: Diagnosis not present

## 2023-02-26 DIAGNOSIS — I480 Paroxysmal atrial fibrillation: Secondary | ICD-10-CM | POA: Diagnosis not present

## 2023-02-26 DIAGNOSIS — I700 Atherosclerosis of aorta: Secondary | ICD-10-CM | POA: Diagnosis not present

## 2023-02-26 DIAGNOSIS — E1122 Type 2 diabetes mellitus with diabetic chronic kidney disease: Secondary | ICD-10-CM | POA: Diagnosis not present

## 2023-02-26 NOTE — Telephone Encounter (Signed)
 Please schedule hospital follow up appointment to order Home Health PT/OT and Nurse as requested due to recent denial from [facility].

## 2023-02-26 NOTE — Telephone Encounter (Signed)
 Mychart message sent to patient. Call return to craig and made him aware that patient needs an appointment prior to verbal orders being authorized

## 2023-02-26 NOTE — Telephone Encounter (Signed)
 Message left on clinical intake voicemail:    Physical therapist with [facility] called to request orders for PT 2 x weekly for 4 weeks to work on strengthening, balance, and mobility.  Please advise (refer to [employer] as well)

## 2023-02-27 ENCOUNTER — Encounter: Payer: Self-pay | Admitting: Family

## 2023-02-27 ENCOUNTER — Telehealth (INDEPENDENT_AMBULATORY_CARE_PROVIDER_SITE_OTHER): Payer: Medicare PPO | Admitting: Family

## 2023-02-27 DIAGNOSIS — I5022 Chronic systolic (congestive) heart failure: Secondary | ICD-10-CM

## 2023-02-27 DIAGNOSIS — M150 Primary generalized (osteo)arthritis: Secondary | ICD-10-CM | POA: Diagnosis not present

## 2023-02-27 DIAGNOSIS — M79644 Pain in right finger(s): Secondary | ICD-10-CM

## 2023-02-27 DIAGNOSIS — I129 Hypertensive chronic kidney disease with stage 1 through stage 4 chronic kidney disease, or unspecified chronic kidney disease: Secondary | ICD-10-CM

## 2023-02-27 DIAGNOSIS — R2681 Unsteadiness on feet: Secondary | ICD-10-CM

## 2023-02-27 DIAGNOSIS — N183 Chronic kidney disease, stage 3 unspecified: Secondary | ICD-10-CM | POA: Diagnosis not present

## 2023-02-27 NOTE — Telephone Encounter (Signed)
 Noted.

## 2023-02-27 NOTE — Telephone Encounter (Signed)
 Spoke with patient, patient states he was not in the hospital recently, patient had an outpatient surgery and that is what is listed as a hospital admission although it was not an actual admission.   Patient states he needs the verbal orders requested (PT/OT) and would like for [name] to give the authorization. Patient scheduled a video visit  to further discuss

## 2023-02-27 NOTE — Progress Notes (Signed)
 This service is provided via telemedicine  No vital signs collected/recorded due to the encounter was a telemedicine visit.   Location of patient (ex: home, work):  Home  Patient consents to a telephone visit:    Location of the provider (ex: office, home):  Office  Name of any referring provider:  Kenitha Glendinning, Roxan BROCKS, NP   Names of all persons participating in the telemedicine service and their role in the encounter:  Debby Maiden; Cherise McClurkin,CMA; Markeria Goetsch,NP  Time spent on call:  5 minutes    Provider: Arzella Rehmann FNP-C  Terrius Gentile, Roxan BROCKS, NP  Patient Care Team: Khani Paino, Roxan BROCKS, NP as PCP - General (Family Medicine) [name], MD as PCP - Cardiology (Cardiology) [name], MD as PCP - Electrophysiology (Cardiology) [name], MD as Consulting Physician (Orthopedic Surgery)  Extended Emergency Contact Information Primary Emergency Contact: Levels,[name] Mobile Phone: [phone] Relation: Daughter Preferred language: English Interpreter needed? No Secondary Emergency Contact: [name],[name] Mobile Phone: [phone] Relation: Daughter  Code Status:  Full Code  Goals of care: Advanced Directive information    02/04/2023    9:16 AM  Advanced Directives  Does Patient Have a Medical Advance Directive? Yes  Type of Estate Agent of [place];Living will  Does patient want to make changes to medical advance directive? No - Patient declined  Copy of Healthcare Power of Attorney in Chart? No - copy requested     Chief Complaint  Patient presents with   Acute Visit    Patient would like a referral for home health and OT    HPI:  Pt is a 73 y.o. male seen today for an acute visit for evaluation for referral to home health. He is status post right index finger proximal interphalangeal joint arthrodesis done on 02/04/2023 by Versie Lenis.He request home health Physical therapy,Occupation therapy and Nurse to be  re-ordered states was recently denied by [employer].States need HH PT/OT to improve mobility on right hand.Assist need PT for gait stability and strengthening while getting in and out of the bed to chair. He also request Home health Nurse states been monitoring weight,medication management due to changes to [place] and collecting urine specimen due to prostate issues. Follows up with urology.  Has been following up with Nephrologist and oncologist at [facility] for renal cell carcinoma .Has upcoming appointment with oncologist on 03/29/2023.  denies any fever,chills,nausea,vomiting,abdominal pain,flank pain,urgency,frequency,dysuria,difficult urination or hematuria,   Past Medical History:  Diagnosis Date   Alcohol use disorder in remission 1999   Anemia    iron deficiency   Anxiety and depression    Arthritis    ankles, knees and hands   At high risk for falls    Basal cell carcinoma    x4 (on both shoulders, one on forehead, one on nose)   CKD stage 3b, GFR 30-44 ml/min (HCC)    Former cigarette smoker 1999   Generalized anxiety disorder    Hard of hearing    Headache    History of kidney stones    Hyperlipidemia due to type 2 diabetes mellitus (HCC)    Hypertension    Morbid obesity (HCC)    Neuropathy    O2 dependent    2L   Presence of permanent cardiac pacemaker    Renal cell carcinoma of left kidney (HCC)    pt had cryotherapy for this at [facility] 12/11/22)   Risk for falls    Sleep apnea    uses CPAP   TIA (  transient ischemic attack)    pt states this was not confirmed due to MRI unable to be performed   Type 2 diabetes mellitus (HCC)    Ventral hernia with bowel obstruction    Per Hospital Encounter on 05/21/22   Past Surgical History:  Procedure Laterality Date   APPENDECTOMY  1980   BREAST SURGERY Left    removed part of breast due to gynecomastia   COLOSTOMY  1989   and reversal same year   COLOSTOMY  2017   FINGER ARTHRODESIS Right 02/04/2023   Procedure:  RIGHT INDEX FINGER PROXIMAL INTERPHALANGEAL JOINT ARTHRODESIS;  Surgeon: [name], MD;  Location: [facility] OR;  Service: Orthopedics;  Laterality: Right;   IR THORACENTESIS ASP PLEURAL SPACE W/IMG GUIDE  05/18/2021   IR THORACENTESIS ASP PLEURAL SPACE W/IMG GUIDE  10/06/2021   KNEE SURGERY Right    NASAL SEPTUM SURGERY  1982   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   Procedure: PACEMAKER IMPLANT;  Surgeon: [name], MD;  Location: [facility] INVASIVE CV LAB;  Service: Cardiovascular;  Laterality: N/A;   RENAL CRYOABLATION  12/11/2022   VARICOCELECTOMY  1983    Allergies  Allergen Reactions   Tape Rash    If left on for a long time    Outpatient Encounter Medications as of 02/27/2023  Medication Sig   Accu-Chek Softclix Lancets lancets USE TO TEST BLOOD GLUCOSE ONCE DAILY AS DIRECTED   acetaminophen  (TYLENOL ) 325 MG tablet Take 2 tablets (650 mg total) by mouth every 6 (six) hours.   alfuzosin  (UROXATRAL ) 10 MG 24 hr tablet TAKE 1 TABLET BY MOUTH EVERY DAY   ALPRAZolam  (XANAX ) 0.5 MG tablet TAKE 1 TABLET(0.5 MG) BY MOUTH TWICE DAILY AS NEEDED FOR ANXIETY OR SLEEP   apixaban  (ELIQUIS ) 5 MG TABS tablet TAKE 1 TABLET(5 MG) BY MOUTH TWICE DAILY   Ascorbic Acid (VITAMIN C  PO) Take 1 tablet by mouth daily.   Continuous Glucose Sensor (DEXCOM G7 SENSOR) MISC 1 Device by Does not apply route as directed.   CRANBERRY PO Take 1 tablet by mouth daily.   Cyanocobalamin  (B-12 PO) Take 1 tablet by mouth daily.   diclofenac  Sodium (VOLTAREN ) 1 % GEL Apply 2 g topically 4 (four) times daily. Apply to your right neck and shoulder area 3-4 times a day as needed (Patient taking differently: Apply 2 g topically 4 (four) times daily as needed (pain). Apply to your right neck and shoulder area 3-4 times a day as needed)   DULoxetine  (CYMBALTA ) 60 MG capsule TAKE 1 CAPSULE(60 MG) BY MOUTH DAILY   Ferrous Sulfate  (IRON PO) Take 1 tablet by mouth daily.   furosemide  (LASIX ) 80 MG tablet Take 80 mg by mouth 2 (two)  times daily.   glucose blood (ACCU-CHEK GUIDE) test strip USE TO TEST BLOOD SUGAR ONCE DAILY   insulin  aspart (NOVOLOG ) 100 UNIT/ML injection Max daily 100 units   Insulin  Disposable Pump (OMNIPOD 5 G7 INTRO, GEN 5,) KIT 1 Device by Does not apply route every other day.   Insulin  Disposable Pump (OMNIPOD 5 G7 PODS, GEN 5,) MISC 1 Device by Does not apply route every other day.   isosorbide  mononitrate (IMDUR ) 60 MG 24 hr tablet TAKE 1 TABLET(60 MG) BY MOUTH DAILY   ketoconazole  (NIZORAL ) 2 % cream APPLY TOPICALLY TO THE AFFECTED AREA AS NEEDED FOR IRRITATION   Lancet Device MISC 1 Device by Does not apply route daily.   methocarbamol  (ROBAXIN ) 500 MG tablet TAKE 1 TABLET(500 MG) BY MOUTH EVERY 8  HOURS AS NEEDED FOR MUSCLE SPASMS   metolazone  (ZAROXOLYN ) 2.5 MG tablet Take 1 tablet (2.5 mg total) by mouth daily. Take 1-2 times a week as needed , spacing out 3 days between doses   ondansetron  (ZOFRAN ) 4 MG tablet TAKE 1 TABLET(4 MG) BY MOUTH EVERY 8 HOURS AS NEEDED FOR NAUSEA OR VOMITING   oxyCODONE  (ROXICODONE ) 5 MG immediate release tablet Take 1 tablet (5 mg total) by mouth every 6 (six) hours as needed for severe pain (pain score 7-10).   OXYGEN  Inhale 2 L into the lungs continuous.   pantoprazole  (PROTONIX ) 40 MG tablet TAKE 1 TABLET(40 MG) BY MOUTH TWICE DAILY BEFORE A MEAL   potassium chloride  SA (KLOR-CON  M) 20 MEQ tablet Take 1 tablet (20 mEq total) by mouth 2 (two) times daily.   Red Yeast Rice 600 MG CAPS Take 1,200 mg by mouth daily.   rOPINIRole  (REQUIP ) 2 MG tablet TAKE 1 TABLET(2 MG) BY MOUTH AT BEDTIME   rosuvastatin  (CRESTOR ) 10 MG tablet TAKE 1 TABLET BY MOUTH EVERY DAY   tirzepatide  (MOUNJARO ) 5 MG/0.5ML Pen Inject 5 mg into the skin once a week.   tiZANidine  (ZANAFLEX ) 2 MG tablet TAKE 1 TABLET BY MOUTH TWICE DAILY (Patient taking differently: Take 2 mg by mouth at bedtime as needed for muscle spasms.)   topiramate  (TOPAMAX ) 25 MG tablet TAKE 1 TABLET BY MOUTH EVERY DAY    traZODone  (DESYREL ) 50 MG tablet TAKE 2 TABLETS(100 MG) BY MOUTH AT BEDTIME   Vitamin D , Ergocalciferol , (DRISDOL ) 1.25 MG (50000 UNIT) CAPS capsule TAKE 1 CAPSULE BY MOUTH EVERY 7 DAYS   empagliflozin  (JARDIANCE ) 25 MG TABS tablet Take 1 tablet (25 mg total) by mouth daily. (Patient not taking: Reported on 02/27/2023)   liraglutide  (VICTOZA ) 18 MG/3ML SOPN Inject 1.8 mg into the skin daily. (Patient not taking: Reported on 02/27/2023)   No facility-administered encounter medications on file as of 02/27/2023.    Review of Systems  Constitutional:  Negative for appetite change, chills, fatigue, fever and unexpected weight change.  HENT:  Negative for congestion, ear discharge, ear pain, hearing loss, nosebleeds, postnasal drip, rhinorrhea, sinus pressure, sinus pain, sneezing, sore throat, tinnitus and trouble swallowing.   Eyes:  Negative for pain, discharge, redness, itching and visual disturbance.  Respiratory:  Negative for cough, chest tightness, shortness of breath and wheezing.   Cardiovascular:  Negative for chest pain, palpitations and leg swelling.       Pacemaker   Gastrointestinal:  Negative for abdominal distention, abdominal pain, blood in stool, constipation, diarrhea, nausea and vomiting.       Colostomy   Endocrine: Negative for cold intolerance, heat intolerance, polydipsia and polyphagia.  Genitourinary:  Negative for difficulty urinating, dysuria, flank pain and urgency.       Follows up with urologist for prostatitis   Musculoskeletal:  Positive for arthralgias and gait problem. Negative for back pain, joint swelling, myalgias, neck pain and neck stiffness.       Right index finger s/p arthrodesis   Skin:  Negative for color change, pallor and rash.  Neurological:  Negative for dizziness, syncope, speech difficulty, light-headedness and headaches.       Neuropathy   Hematological:  Does not bruise/bleed easily.  Psychiatric/Behavioral:  Negative for agitation, behavioral  problems, confusion, hallucinations and sleep disturbance. The patient is not nervous/anxious.     Immunization History  Administered Date(s) Administered   Fluad Quad(high Dose 65+) 11/19/2020, 11/02/2021   H1N1 01/24/2008   Moderna Sars-Covid-2 Vaccination 04/03/2019,  05/05/2019, 12/20/2019, 05/23/2020   PNEUMOCOCCAL CONJUGATE-20 06/28/2022   Pfizer Covid-19 Vaccine Bivalent Booster 31yrs & up 12/11/2020   Pfizer(Comirnaty)Fall Seasonal Vaccine 12 years and older 11/19/2021   Rsv, Bivalent, Protein Subunit Rsvpref,pf [place]) 11/19/2021   Zoster Recombinant(Shingrix) 03/01/2020, 05/23/2020   Pertinent  Health Maintenance Due  Topic Date Due   INFLUENZA VACCINE  09/20/2022   OPHTHALMOLOGY EXAM  06/21/2023   HEMOGLOBIN A1C  07/03/2023   FOOT EXAM  10/01/2023   Colonoscopy  Discontinued      02/13/2022    8:00 AM 02/20/2022    1:51 PM 06/28/2022   11:22 AM 10/09/2022   11:29 AM 02/27/2023    2:18 PM  Fall Risk  Falls in the past year?  0 0 0 0  Was there an injury with Fall?  0 0 0 0  Fall Risk Category Calculator  0 0 0 0  Fall Risk Category (Retired)  Low     (RETIRED) Patient Fall Risk Level Moderate fall risk Low fall risk     Patient at Risk for Falls Due to  No Fall Risks No Fall Risks No Fall Risks No Fall Risks  Fall risk Follow up  Falls evaluation completed Falls evaluation completed Falls evaluation completed Falls evaluation completed   Functional Status Survey:    There were no vitals filed for this visit. There is no height or weight on file to calculate BMI. Physical Exam Constitutional:      General: He is not in acute distress.    Appearance: He is not ill-appearing.  Pulmonary:     Effort: Pulmonary effort is normal. No respiratory distress.  Musculoskeletal:     Comments: Right index finger splint in place   Neurological:     Mental Status: He is alert and oriented to person, place, and time.     Gait: Gait abnormal.  Psychiatric:        Mood and  Affect: Mood normal.        Behavior: Behavior normal.     Labs reviewed: Recent Labs    05/23/22 0348 06/06/22 0000 09/26/22 0452 09/27/22 0606 10/09/22 1200 02/04/23 0948  NA 137   < > 143 135 138 132*  K 3.5   < > 3.6 3.2* 3.9 3.0*  CL 105   < > 110 102 102 98  CO2 21*   < > 23 23 25 23   GLUCOSE 182*   < > 216* 185* 163* 196*  BUN 31*   < > 33* 27* 31* 35*  CREATININE 1.91*   < > 1.81* 1.75* 1.85* 2.19*  CALCIUM  8.5*   < > 8.6* 8.3* 9.1 8.4*  MG 2.2  --  3.0* 2.7*  --   --   PHOS  --   --   --  1.8*  --   --    < > = values in this interval not displayed.   Recent Labs    06/06/22 0000 09/24/22 0155 09/27/22 0606 10/09/22 1200  AST 13* 15 15 13   ALT 11 16 12 11   ALKPHOS 96 85 74  --   BILITOT  --  0.6 0.8 0.4  PROT  --  6.9 6.8 6.8  ALBUMIN 4.0 3.5 3.3*  --    Recent Labs    09/26/22 0452 09/27/22 0606 10/09/22 1200 02/04/23 0948  WBC 7.4 6.5 7.3 6.7  NEUTROABS 5.4 4.2 5,570  --   HGB 12.7* 11.6* 12.4* 11.9*  HCT 43.4 39.4 39.9 37.8*  MCV 91.9 90.0 87.3 88.3  PLT 236 212 263 223   Lab Results  Component Value Date   TSH 1.05 10/09/2022   Lab Results  Component Value Date   HGBA1C 7.5 (A) 01/03/2023   Lab Results  Component Value Date   CHOL 152 10/09/2022   HDL 50 10/09/2022   LDLCALC 75 10/09/2022   TRIG 168 (H) 10/09/2022   CHOLHDL 3.0 10/09/2022    Significant Diagnostic Results in last 30 days:  DG Finger Index Right Result Date: 02/04/2023 CLINICAL DATA:  Elective surgery. EXAM: RIGHT INDEX FINGER 2+V COMPARISON:  None Available. FINDINGS: Two fluoroscopic spot views of the index finger obtained in the operating room. Arthroplasty with screws within the proximal and middle phalanx. Fluoroscopy time 40 seconds. Dose 1.54 mGy. IMPRESSION: Intraoperative fluoroscopy during index finger surgery. Electronically Signed   By: Andrea Gasman M.D.   On: 02/04/2023 15:44   DG C-Arm 1-60 Min-No Report Result Date: 02/04/2023 Fluoroscopy was  utilized by the requesting physician.  No radiographic interpretation.    Assessment/Plan 1. Primary osteoarthritis involving multiple joints (Primary) Worst on hand,knees,ankles.Requires HH PT/OT for ROM,exercise,gait stability and muscle strengthening. - Ambulatory referral to Home Health  2. Pain of finger of right hand S/p right index finger proximal interphalelangeal joint arthrodesis 02/04/2023  - Ambulatory referral to Home Health PT/OT ROM to improve mobility   3. Chronic systolic congestive heart failure (HCC) No symptoms of fluid overload reported.HH Nurse for weight check and monitor edema.  - Ambulatory referral to Home Health  4. Benign hypertension with CKD (chronic kidney disease) stage III (HCC) HH nurse for blood pressure check and medication management - Ambulatory referral to Home Health  5. Unsteady gait Fall and safety precaution - Ambulatory referral to Home Health PT/OT for ROM,exercise,gait stability and muscle strengthening  Family/ staff Communication: Reviewed plan of care with patient verbalized understanding   Labs/tests ordered: None   Next Appointment: Return if symptoms worsen or fail to improve.   I connected with  Debby CHRISTELLA Maiden on 02/27/23 by a video enabled telemedicine application and verified that I am speaking with the correct person using two identifiers.   I discussed the limitations of evaluation and management by telemedicine. The patient expressed understanding and agreed to proceed.   Spent 16 minutes of face to face with patient  >50% time spent counseling; reviewing medical record; tests; labs; documenting and developing future plan of care.   Roxan JAYSON Plough, NP

## 2023-02-28 DIAGNOSIS — E118 Type 2 diabetes mellitus with unspecified complications: Secondary | ICD-10-CM | POA: Diagnosis not present

## 2023-02-28 DIAGNOSIS — Z933 Colostomy status: Secondary | ICD-10-CM | POA: Diagnosis not present

## 2023-02-28 DIAGNOSIS — K56609 Unspecified intestinal obstruction, unspecified as to partial versus complete obstruction: Secondary | ICD-10-CM | POA: Diagnosis not present

## 2023-02-28 DIAGNOSIS — E119 Type 2 diabetes mellitus without complications: Secondary | ICD-10-CM | POA: Diagnosis not present

## 2023-02-28 DIAGNOSIS — Z794 Long term (current) use of insulin: Secondary | ICD-10-CM | POA: Diagnosis not present

## 2023-03-01 DIAGNOSIS — Z794 Long term (current) use of insulin: Secondary | ICD-10-CM | POA: Diagnosis not present

## 2023-03-01 DIAGNOSIS — E119 Type 2 diabetes mellitus without complications: Secondary | ICD-10-CM | POA: Diagnosis not present

## 2023-03-01 DIAGNOSIS — Z933 Colostomy status: Secondary | ICD-10-CM | POA: Diagnosis not present

## 2023-03-01 DIAGNOSIS — E118 Type 2 diabetes mellitus with unspecified complications: Secondary | ICD-10-CM | POA: Diagnosis not present

## 2023-03-01 DIAGNOSIS — K56609 Unspecified intestinal obstruction, unspecified as to partial versus complete obstruction: Secondary | ICD-10-CM | POA: Diagnosis not present

## 2023-03-04 ENCOUNTER — Other Ambulatory Visit: Payer: Self-pay | Admitting: Family

## 2023-03-04 DIAGNOSIS — I130 Hypertensive heart and chronic kidney disease with heart failure and stage 1 through stage 4 chronic kidney disease, or unspecified chronic kidney disease: Secondary | ICD-10-CM | POA: Diagnosis not present

## 2023-03-04 DIAGNOSIS — I5022 Chronic systolic (congestive) heart failure: Secondary | ICD-10-CM | POA: Diagnosis not present

## 2023-03-04 DIAGNOSIS — G8918 Other acute postprocedural pain: Secondary | ICD-10-CM | POA: Diagnosis not present

## 2023-03-04 DIAGNOSIS — R2681 Unsteadiness on feet: Secondary | ICD-10-CM | POA: Diagnosis not present

## 2023-03-04 DIAGNOSIS — Z4789 Encounter for other orthopedic aftercare: Secondary | ICD-10-CM | POA: Diagnosis not present

## 2023-03-04 DIAGNOSIS — M150 Primary generalized (osteo)arthritis: Secondary | ICD-10-CM | POA: Diagnosis not present

## 2023-03-04 DIAGNOSIS — G8929 Other chronic pain: Secondary | ICD-10-CM

## 2023-03-04 DIAGNOSIS — N1832 Chronic kidney disease, stage 3b: Secondary | ICD-10-CM | POA: Diagnosis not present

## 2023-03-04 DIAGNOSIS — D509 Iron deficiency anemia, unspecified: Secondary | ICD-10-CM | POA: Diagnosis not present

## 2023-03-04 DIAGNOSIS — C642 Malignant neoplasm of left kidney, except renal pelvis: Secondary | ICD-10-CM | POA: Diagnosis not present

## 2023-03-04 DIAGNOSIS — E1142 Type 2 diabetes mellitus with diabetic polyneuropathy: Secondary | ICD-10-CM

## 2023-03-04 NOTE — Telephone Encounter (Signed)
 Patient is requesting refil on medication that's not on medication list. Medication pend and sent to PCP Ngetich, [name], NP

## 2023-03-05 ENCOUNTER — Other Ambulatory Visit: Payer: Self-pay | Admitting: Family

## 2023-03-05 DIAGNOSIS — M542 Cervicalgia: Secondary | ICD-10-CM

## 2023-03-05 NOTE — Telephone Encounter (Signed)
 High risk or very high risk warning populated when attempting to refill medication. RX request sent to PCP for review and approval if warranted.

## 2023-03-06 DIAGNOSIS — I5022 Chronic systolic (congestive) heart failure: Secondary | ICD-10-CM | POA: Diagnosis not present

## 2023-03-06 DIAGNOSIS — Z4789 Encounter for other orthopedic aftercare: Secondary | ICD-10-CM | POA: Diagnosis not present

## 2023-03-06 DIAGNOSIS — M150 Primary generalized (osteo)arthritis: Secondary | ICD-10-CM | POA: Diagnosis not present

## 2023-03-06 DIAGNOSIS — D509 Iron deficiency anemia, unspecified: Secondary | ICD-10-CM | POA: Diagnosis not present

## 2023-03-06 DIAGNOSIS — N1832 Chronic kidney disease, stage 3b: Secondary | ICD-10-CM | POA: Diagnosis not present

## 2023-03-06 DIAGNOSIS — I130 Hypertensive heart and chronic kidney disease with heart failure and stage 1 through stage 4 chronic kidney disease, or unspecified chronic kidney disease: Secondary | ICD-10-CM | POA: Diagnosis not present

## 2023-03-06 DIAGNOSIS — C642 Malignant neoplasm of left kidney, except renal pelvis: Secondary | ICD-10-CM | POA: Diagnosis not present

## 2023-03-06 DIAGNOSIS — R2681 Unsteadiness on feet: Secondary | ICD-10-CM | POA: Diagnosis not present

## 2023-03-06 DIAGNOSIS — G8918 Other acute postprocedural pain: Secondary | ICD-10-CM | POA: Diagnosis not present

## 2023-03-08 DIAGNOSIS — I130 Hypertensive heart and chronic kidney disease with heart failure and stage 1 through stage 4 chronic kidney disease, or unspecified chronic kidney disease: Secondary | ICD-10-CM | POA: Diagnosis not present

## 2023-03-08 DIAGNOSIS — I5022 Chronic systolic (congestive) heart failure: Secondary | ICD-10-CM | POA: Diagnosis not present

## 2023-03-08 DIAGNOSIS — C642 Malignant neoplasm of left kidney, except renal pelvis: Secondary | ICD-10-CM | POA: Diagnosis not present

## 2023-03-08 DIAGNOSIS — Z4789 Encounter for other orthopedic aftercare: Secondary | ICD-10-CM | POA: Diagnosis not present

## 2023-03-08 DIAGNOSIS — G8918 Other acute postprocedural pain: Secondary | ICD-10-CM | POA: Diagnosis not present

## 2023-03-08 DIAGNOSIS — M150 Primary generalized (osteo)arthritis: Secondary | ICD-10-CM | POA: Diagnosis not present

## 2023-03-08 DIAGNOSIS — D509 Iron deficiency anemia, unspecified: Secondary | ICD-10-CM | POA: Diagnosis not present

## 2023-03-08 DIAGNOSIS — R2681 Unsteadiness on feet: Secondary | ICD-10-CM | POA: Diagnosis not present

## 2023-03-08 DIAGNOSIS — N1832 Chronic kidney disease, stage 3b: Secondary | ICD-10-CM | POA: Diagnosis not present

## 2023-03-12 DIAGNOSIS — I5022 Chronic systolic (congestive) heart failure: Secondary | ICD-10-CM | POA: Diagnosis not present

## 2023-03-12 DIAGNOSIS — Z933 Colostomy status: Secondary | ICD-10-CM | POA: Diagnosis not present

## 2023-03-12 DIAGNOSIS — E119 Type 2 diabetes mellitus without complications: Secondary | ICD-10-CM | POA: Diagnosis not present

## 2023-03-12 DIAGNOSIS — D509 Iron deficiency anemia, unspecified: Secondary | ICD-10-CM | POA: Diagnosis not present

## 2023-03-12 DIAGNOSIS — Z4789 Encounter for other orthopedic aftercare: Secondary | ICD-10-CM | POA: Diagnosis not present

## 2023-03-12 DIAGNOSIS — K56609 Unspecified intestinal obstruction, unspecified as to partial versus complete obstruction: Secondary | ICD-10-CM | POA: Diagnosis not present

## 2023-03-12 DIAGNOSIS — I130 Hypertensive heart and chronic kidney disease with heart failure and stage 1 through stage 4 chronic kidney disease, or unspecified chronic kidney disease: Secondary | ICD-10-CM | POA: Diagnosis not present

## 2023-03-12 DIAGNOSIS — R2681 Unsteadiness on feet: Secondary | ICD-10-CM | POA: Diagnosis not present

## 2023-03-12 DIAGNOSIS — G8918 Other acute postprocedural pain: Secondary | ICD-10-CM | POA: Diagnosis not present

## 2023-03-12 DIAGNOSIS — E118 Type 2 diabetes mellitus with unspecified complications: Secondary | ICD-10-CM | POA: Diagnosis not present

## 2023-03-12 DIAGNOSIS — N1832 Chronic kidney disease, stage 3b: Secondary | ICD-10-CM | POA: Diagnosis not present

## 2023-03-12 DIAGNOSIS — C642 Malignant neoplasm of left kidney, except renal pelvis: Secondary | ICD-10-CM | POA: Diagnosis not present

## 2023-03-12 DIAGNOSIS — M150 Primary generalized (osteo)arthritis: Secondary | ICD-10-CM | POA: Diagnosis not present

## 2023-03-12 DIAGNOSIS — Z794 Long term (current) use of insulin: Secondary | ICD-10-CM | POA: Diagnosis not present

## 2023-03-13 DIAGNOSIS — G8918 Other acute postprocedural pain: Secondary | ICD-10-CM | POA: Diagnosis not present

## 2023-03-13 DIAGNOSIS — Z4789 Encounter for other orthopedic aftercare: Secondary | ICD-10-CM | POA: Diagnosis not present

## 2023-03-13 DIAGNOSIS — C642 Malignant neoplasm of left kidney, except renal pelvis: Secondary | ICD-10-CM | POA: Diagnosis not present

## 2023-03-13 DIAGNOSIS — I5022 Chronic systolic (congestive) heart failure: Secondary | ICD-10-CM | POA: Diagnosis not present

## 2023-03-13 DIAGNOSIS — N410 Acute prostatitis: Secondary | ICD-10-CM | POA: Diagnosis not present

## 2023-03-13 DIAGNOSIS — M150 Primary generalized (osteo)arthritis: Secondary | ICD-10-CM | POA: Diagnosis not present

## 2023-03-13 DIAGNOSIS — D509 Iron deficiency anemia, unspecified: Secondary | ICD-10-CM | POA: Diagnosis not present

## 2023-03-13 DIAGNOSIS — I130 Hypertensive heart and chronic kidney disease with heart failure and stage 1 through stage 4 chronic kidney disease, or unspecified chronic kidney disease: Secondary | ICD-10-CM | POA: Diagnosis not present

## 2023-03-13 DIAGNOSIS — R2681 Unsteadiness on feet: Secondary | ICD-10-CM | POA: Diagnosis not present

## 2023-03-13 DIAGNOSIS — N1832 Chronic kidney disease, stage 3b: Secondary | ICD-10-CM | POA: Diagnosis not present

## 2023-03-14 ENCOUNTER — Telehealth: Payer: Self-pay

## 2023-03-14 DIAGNOSIS — I5022 Chronic systolic (congestive) heart failure: Secondary | ICD-10-CM | POA: Diagnosis not present

## 2023-03-14 DIAGNOSIS — Z4789 Encounter for other orthopedic aftercare: Secondary | ICD-10-CM | POA: Diagnosis not present

## 2023-03-14 DIAGNOSIS — M150 Primary generalized (osteo)arthritis: Secondary | ICD-10-CM | POA: Diagnosis not present

## 2023-03-14 DIAGNOSIS — G8918 Other acute postprocedural pain: Secondary | ICD-10-CM | POA: Diagnosis not present

## 2023-03-14 DIAGNOSIS — R2681 Unsteadiness on feet: Secondary | ICD-10-CM | POA: Diagnosis not present

## 2023-03-14 DIAGNOSIS — N1832 Chronic kidney disease, stage 3b: Secondary | ICD-10-CM | POA: Diagnosis not present

## 2023-03-14 DIAGNOSIS — D509 Iron deficiency anemia, unspecified: Secondary | ICD-10-CM | POA: Diagnosis not present

## 2023-03-14 DIAGNOSIS — C642 Malignant neoplasm of left kidney, except renal pelvis: Secondary | ICD-10-CM | POA: Diagnosis not present

## 2023-03-14 DIAGNOSIS — I130 Hypertensive heart and chronic kidney disease with heart failure and stage 1 through stage 4 chronic kidney disease, or unspecified chronic kidney disease: Secondary | ICD-10-CM | POA: Diagnosis not present

## 2023-03-14 NOTE — Telephone Encounter (Signed)
Graze from [facility] called and requested verbal orders for patient OT once a week for 1 week, twice a week for 3 weeks, and once a week for 4 weeks. Verbal orders given. Message routed to PCP Ngetich, [name], NP as Lorain Childes.

## 2023-03-14 NOTE — Telephone Encounter (Signed)
Noted  

## 2023-03-15 ENCOUNTER — Ambulatory Visit (INDEPENDENT_AMBULATORY_CARE_PROVIDER_SITE_OTHER): Payer: Medicare PPO

## 2023-03-15 DIAGNOSIS — I442 Atrioventricular block, complete: Secondary | ICD-10-CM | POA: Diagnosis not present

## 2023-03-15 LAB — CUP PACEART REMOTE DEVICE CHECK
Battery Remaining Longevity: 80 mo
Battery Remaining Percentage: 84 %
Battery Voltage: 2.99 V
Brady Statistic AP VP Percent: 53 %
Brady Statistic AP VS Percent: 1 %
Brady Statistic AS VP Percent: 47 %
Brady Statistic AS VS Percent: 1 %
Brady Statistic RA Percent Paced: 51 %
Brady Statistic RV Percent Paced: 99 %
Date Time Interrogation Session: 20250124020031
Implantable Lead Connection Status: 753985
Implantable Lead Connection Status: 753985
Implantable Lead Implant Date: 20230728
Implantable Lead Implant Date: 20230728
Implantable Lead Location: 753859
Implantable Lead Location: 753860
Implantable Lead Model: 3830
Implantable Pulse Generator Implant Date: 20230728
Lead Channel Impedance Value: 410 Ohm
Lead Channel Impedance Value: 510 Ohm
Lead Channel Pacing Threshold Amplitude: 0.75 V
Lead Channel Pacing Threshold Amplitude: 1 V
Lead Channel Pacing Threshold Pulse Width: 0.5 ms
Lead Channel Pacing Threshold Pulse Width: 0.5 ms
Lead Channel Sensing Intrinsic Amplitude: 12 mV
Lead Channel Sensing Intrinsic Amplitude: 2.7 mV
Lead Channel Setting Pacing Amplitude: 1.75 V
Lead Channel Setting Pacing Amplitude: 2.5 V
Lead Channel Setting Pacing Pulse Width: 0.5 ms
Lead Channel Setting Sensing Sensitivity: 2.5 mV
Pulse Gen Model: 2272
Pulse Gen Serial Number: 8101667

## 2023-03-17 ENCOUNTER — Other Ambulatory Visit: Payer: Self-pay | Admitting: Family

## 2023-03-18 NOTE — Telephone Encounter (Signed)
Patient has request refill ahead of time. Patient medication has warnings. Medication pend and sent to PCP Ngetich, [name], NP for approval.

## 2023-03-19 DIAGNOSIS — N1832 Chronic kidney disease, stage 3b: Secondary | ICD-10-CM | POA: Diagnosis not present

## 2023-03-19 DIAGNOSIS — C642 Malignant neoplasm of left kidney, except renal pelvis: Secondary | ICD-10-CM | POA: Diagnosis not present

## 2023-03-19 DIAGNOSIS — I130 Hypertensive heart and chronic kidney disease with heart failure and stage 1 through stage 4 chronic kidney disease, or unspecified chronic kidney disease: Secondary | ICD-10-CM | POA: Diagnosis not present

## 2023-03-19 DIAGNOSIS — G8918 Other acute postprocedural pain: Secondary | ICD-10-CM | POA: Diagnosis not present

## 2023-03-19 DIAGNOSIS — I5022 Chronic systolic (congestive) heart failure: Secondary | ICD-10-CM | POA: Diagnosis not present

## 2023-03-19 DIAGNOSIS — M150 Primary generalized (osteo)arthritis: Secondary | ICD-10-CM | POA: Diagnosis not present

## 2023-03-19 DIAGNOSIS — D509 Iron deficiency anemia, unspecified: Secondary | ICD-10-CM | POA: Diagnosis not present

## 2023-03-19 DIAGNOSIS — Z4789 Encounter for other orthopedic aftercare: Secondary | ICD-10-CM | POA: Diagnosis not present

## 2023-03-19 DIAGNOSIS — R2681 Unsteadiness on feet: Secondary | ICD-10-CM | POA: Diagnosis not present

## 2023-03-19 NOTE — Telephone Encounter (Signed)
Pended Letter and sent to [facility] for approval.

## 2023-03-19 NOTE — Telephone Encounter (Signed)
Ngetich, [name], NP to Me     03/18/23 12:06 PM Please draft letter as requested.

## 2023-03-20 DIAGNOSIS — D509 Iron deficiency anemia, unspecified: Secondary | ICD-10-CM | POA: Diagnosis not present

## 2023-03-20 DIAGNOSIS — Z4789 Encounter for other orthopedic aftercare: Secondary | ICD-10-CM | POA: Diagnosis not present

## 2023-03-20 DIAGNOSIS — I130 Hypertensive heart and chronic kidney disease with heart failure and stage 1 through stage 4 chronic kidney disease, or unspecified chronic kidney disease: Secondary | ICD-10-CM | POA: Diagnosis not present

## 2023-03-20 DIAGNOSIS — R2681 Unsteadiness on feet: Secondary | ICD-10-CM | POA: Diagnosis not present

## 2023-03-20 DIAGNOSIS — C642 Malignant neoplasm of left kidney, except renal pelvis: Secondary | ICD-10-CM | POA: Diagnosis not present

## 2023-03-20 DIAGNOSIS — N1832 Chronic kidney disease, stage 3b: Secondary | ICD-10-CM | POA: Diagnosis not present

## 2023-03-20 DIAGNOSIS — I5022 Chronic systolic (congestive) heart failure: Secondary | ICD-10-CM | POA: Diagnosis not present

## 2023-03-20 DIAGNOSIS — G8918 Other acute postprocedural pain: Secondary | ICD-10-CM | POA: Diagnosis not present

## 2023-03-20 DIAGNOSIS — M150 Primary generalized (osteo)arthritis: Secondary | ICD-10-CM | POA: Diagnosis not present

## 2023-03-21 DIAGNOSIS — R2681 Unsteadiness on feet: Secondary | ICD-10-CM | POA: Diagnosis not present

## 2023-03-21 DIAGNOSIS — D509 Iron deficiency anemia, unspecified: Secondary | ICD-10-CM | POA: Diagnosis not present

## 2023-03-21 DIAGNOSIS — B351 Tinea unguium: Secondary | ICD-10-CM | POA: Diagnosis not present

## 2023-03-21 DIAGNOSIS — I83893 Varicose veins of bilateral lower extremities with other complications: Secondary | ICD-10-CM | POA: Diagnosis not present

## 2023-03-21 DIAGNOSIS — M2042 Other hammer toe(s) (acquired), left foot: Secondary | ICD-10-CM | POA: Diagnosis not present

## 2023-03-21 DIAGNOSIS — M79672 Pain in left foot: Secondary | ICD-10-CM | POA: Diagnosis not present

## 2023-03-21 DIAGNOSIS — C642 Malignant neoplasm of left kidney, except renal pelvis: Secondary | ICD-10-CM | POA: Diagnosis not present

## 2023-03-21 DIAGNOSIS — I5022 Chronic systolic (congestive) heart failure: Secondary | ICD-10-CM | POA: Diagnosis not present

## 2023-03-21 DIAGNOSIS — I130 Hypertensive heart and chronic kidney disease with heart failure and stage 1 through stage 4 chronic kidney disease, or unspecified chronic kidney disease: Secondary | ICD-10-CM | POA: Diagnosis not present

## 2023-03-21 DIAGNOSIS — M150 Primary generalized (osteo)arthritis: Secondary | ICD-10-CM | POA: Diagnosis not present

## 2023-03-21 DIAGNOSIS — Z4789 Encounter for other orthopedic aftercare: Secondary | ICD-10-CM | POA: Diagnosis not present

## 2023-03-21 DIAGNOSIS — L600 Ingrowing nail: Secondary | ICD-10-CM | POA: Diagnosis not present

## 2023-03-21 DIAGNOSIS — M79671 Pain in right foot: Secondary | ICD-10-CM | POA: Diagnosis not present

## 2023-03-21 DIAGNOSIS — M21541 Acquired clubfoot, right foot: Secondary | ICD-10-CM | POA: Diagnosis not present

## 2023-03-21 DIAGNOSIS — E1140 Type 2 diabetes mellitus with diabetic neuropathy, unspecified: Secondary | ICD-10-CM | POA: Diagnosis not present

## 2023-03-21 DIAGNOSIS — N1832 Chronic kidney disease, stage 3b: Secondary | ICD-10-CM | POA: Diagnosis not present

## 2023-03-21 DIAGNOSIS — M2041 Other hammer toe(s) (acquired), right foot: Secondary | ICD-10-CM | POA: Diagnosis not present

## 2023-03-21 DIAGNOSIS — G8918 Other acute postprocedural pain: Secondary | ICD-10-CM | POA: Diagnosis not present

## 2023-03-25 DIAGNOSIS — R2681 Unsteadiness on feet: Secondary | ICD-10-CM | POA: Diagnosis not present

## 2023-03-25 DIAGNOSIS — G8918 Other acute postprocedural pain: Secondary | ICD-10-CM | POA: Diagnosis not present

## 2023-03-25 DIAGNOSIS — I5022 Chronic systolic (congestive) heart failure: Secondary | ICD-10-CM | POA: Diagnosis not present

## 2023-03-25 DIAGNOSIS — D509 Iron deficiency anemia, unspecified: Secondary | ICD-10-CM | POA: Diagnosis not present

## 2023-03-25 DIAGNOSIS — I130 Hypertensive heart and chronic kidney disease with heart failure and stage 1 through stage 4 chronic kidney disease, or unspecified chronic kidney disease: Secondary | ICD-10-CM | POA: Diagnosis not present

## 2023-03-25 DIAGNOSIS — C642 Malignant neoplasm of left kidney, except renal pelvis: Secondary | ICD-10-CM | POA: Diagnosis not present

## 2023-03-25 DIAGNOSIS — M150 Primary generalized (osteo)arthritis: Secondary | ICD-10-CM | POA: Diagnosis not present

## 2023-03-25 DIAGNOSIS — Z4789 Encounter for other orthopedic aftercare: Secondary | ICD-10-CM | POA: Diagnosis not present

## 2023-03-25 DIAGNOSIS — N1832 Chronic kidney disease, stage 3b: Secondary | ICD-10-CM | POA: Diagnosis not present

## 2023-03-27 DIAGNOSIS — D509 Iron deficiency anemia, unspecified: Secondary | ICD-10-CM | POA: Diagnosis not present

## 2023-03-27 DIAGNOSIS — I130 Hypertensive heart and chronic kidney disease with heart failure and stage 1 through stage 4 chronic kidney disease, or unspecified chronic kidney disease: Secondary | ICD-10-CM | POA: Diagnosis not present

## 2023-03-27 DIAGNOSIS — I5022 Chronic systolic (congestive) heart failure: Secondary | ICD-10-CM | POA: Diagnosis not present

## 2023-03-27 DIAGNOSIS — Z4789 Encounter for other orthopedic aftercare: Secondary | ICD-10-CM | POA: Diagnosis not present

## 2023-03-27 DIAGNOSIS — N1832 Chronic kidney disease, stage 3b: Secondary | ICD-10-CM | POA: Diagnosis not present

## 2023-03-27 DIAGNOSIS — R2681 Unsteadiness on feet: Secondary | ICD-10-CM | POA: Diagnosis not present

## 2023-03-27 DIAGNOSIS — G8918 Other acute postprocedural pain: Secondary | ICD-10-CM | POA: Diagnosis not present

## 2023-03-27 DIAGNOSIS — M150 Primary generalized (osteo)arthritis: Secondary | ICD-10-CM | POA: Diagnosis not present

## 2023-03-27 DIAGNOSIS — C642 Malignant neoplasm of left kidney, except renal pelvis: Secondary | ICD-10-CM | POA: Diagnosis not present

## 2023-03-28 DIAGNOSIS — D509 Iron deficiency anemia, unspecified: Secondary | ICD-10-CM | POA: Diagnosis not present

## 2023-03-28 DIAGNOSIS — N1832 Chronic kidney disease, stage 3b: Secondary | ICD-10-CM | POA: Diagnosis not present

## 2023-03-28 DIAGNOSIS — Z4789 Encounter for other orthopedic aftercare: Secondary | ICD-10-CM | POA: Diagnosis not present

## 2023-03-28 DIAGNOSIS — G4733 Obstructive sleep apnea (adult) (pediatric): Secondary | ICD-10-CM | POA: Diagnosis not present

## 2023-03-28 DIAGNOSIS — I5022 Chronic systolic (congestive) heart failure: Secondary | ICD-10-CM | POA: Diagnosis not present

## 2023-03-28 DIAGNOSIS — G8918 Other acute postprocedural pain: Secondary | ICD-10-CM | POA: Diagnosis not present

## 2023-03-28 DIAGNOSIS — C642 Malignant neoplasm of left kidney, except renal pelvis: Secondary | ICD-10-CM | POA: Diagnosis not present

## 2023-03-28 DIAGNOSIS — I130 Hypertensive heart and chronic kidney disease with heart failure and stage 1 through stage 4 chronic kidney disease, or unspecified chronic kidney disease: Secondary | ICD-10-CM | POA: Diagnosis not present

## 2023-03-28 DIAGNOSIS — M150 Primary generalized (osteo)arthritis: Secondary | ICD-10-CM | POA: Diagnosis not present

## 2023-03-28 DIAGNOSIS — R2681 Unsteadiness on feet: Secondary | ICD-10-CM | POA: Diagnosis not present

## 2023-03-29 DIAGNOSIS — J90 Pleural effusion, not elsewhere classified: Secondary | ICD-10-CM | POA: Diagnosis not present

## 2023-03-29 DIAGNOSIS — Z933 Colostomy status: Secondary | ICD-10-CM | POA: Diagnosis not present

## 2023-03-29 DIAGNOSIS — E118 Type 2 diabetes mellitus with unspecified complications: Secondary | ICD-10-CM | POA: Diagnosis not present

## 2023-03-29 DIAGNOSIS — K56609 Unspecified intestinal obstruction, unspecified as to partial versus complete obstruction: Secondary | ICD-10-CM | POA: Diagnosis not present

## 2023-03-29 DIAGNOSIS — Z794 Long term (current) use of insulin: Secondary | ICD-10-CM | POA: Diagnosis not present

## 2023-03-29 DIAGNOSIS — C642 Malignant neoplasm of left kidney, except renal pelvis: Secondary | ICD-10-CM | POA: Diagnosis not present

## 2023-03-29 DIAGNOSIS — E119 Type 2 diabetes mellitus without complications: Secondary | ICD-10-CM | POA: Diagnosis not present

## 2023-04-01 DIAGNOSIS — R2681 Unsteadiness on feet: Secondary | ICD-10-CM | POA: Diagnosis not present

## 2023-04-01 DIAGNOSIS — Z4789 Encounter for other orthopedic aftercare: Secondary | ICD-10-CM | POA: Diagnosis not present

## 2023-04-01 DIAGNOSIS — M150 Primary generalized (osteo)arthritis: Secondary | ICD-10-CM | POA: Diagnosis not present

## 2023-04-01 DIAGNOSIS — I5022 Chronic systolic (congestive) heart failure: Secondary | ICD-10-CM | POA: Diagnosis not present

## 2023-04-01 DIAGNOSIS — N1832 Chronic kidney disease, stage 3b: Secondary | ICD-10-CM | POA: Diagnosis not present

## 2023-04-01 DIAGNOSIS — C642 Malignant neoplasm of left kidney, except renal pelvis: Secondary | ICD-10-CM | POA: Diagnosis not present

## 2023-04-01 DIAGNOSIS — D509 Iron deficiency anemia, unspecified: Secondary | ICD-10-CM | POA: Diagnosis not present

## 2023-04-01 DIAGNOSIS — I130 Hypertensive heart and chronic kidney disease with heart failure and stage 1 through stage 4 chronic kidney disease, or unspecified chronic kidney disease: Secondary | ICD-10-CM | POA: Diagnosis not present

## 2023-04-01 DIAGNOSIS — G8918 Other acute postprocedural pain: Secondary | ICD-10-CM | POA: Diagnosis not present

## 2023-04-02 DIAGNOSIS — C642 Malignant neoplasm of left kidney, except renal pelvis: Secondary | ICD-10-CM | POA: Diagnosis not present

## 2023-04-03 DIAGNOSIS — G8918 Other acute postprocedural pain: Secondary | ICD-10-CM | POA: Diagnosis not present

## 2023-04-03 DIAGNOSIS — Z4789 Encounter for other orthopedic aftercare: Secondary | ICD-10-CM | POA: Diagnosis not present

## 2023-04-03 DIAGNOSIS — I5022 Chronic systolic (congestive) heart failure: Secondary | ICD-10-CM | POA: Diagnosis not present

## 2023-04-03 DIAGNOSIS — H25043 Posterior subcapsular polar age-related cataract, bilateral: Secondary | ICD-10-CM | POA: Diagnosis not present

## 2023-04-03 DIAGNOSIS — I130 Hypertensive heart and chronic kidney disease with heart failure and stage 1 through stage 4 chronic kidney disease, or unspecified chronic kidney disease: Secondary | ICD-10-CM | POA: Diagnosis not present

## 2023-04-03 DIAGNOSIS — H25013 Cortical age-related cataract, bilateral: Secondary | ICD-10-CM | POA: Diagnosis not present

## 2023-04-03 DIAGNOSIS — H2513 Age-related nuclear cataract, bilateral: Secondary | ICD-10-CM | POA: Diagnosis not present

## 2023-04-03 DIAGNOSIS — D509 Iron deficiency anemia, unspecified: Secondary | ICD-10-CM | POA: Diagnosis not present

## 2023-04-03 DIAGNOSIS — R2681 Unsteadiness on feet: Secondary | ICD-10-CM | POA: Diagnosis not present

## 2023-04-03 DIAGNOSIS — H2512 Age-related nuclear cataract, left eye: Secondary | ICD-10-CM | POA: Diagnosis not present

## 2023-04-03 DIAGNOSIS — M150 Primary generalized (osteo)arthritis: Secondary | ICD-10-CM | POA: Diagnosis not present

## 2023-04-03 DIAGNOSIS — C642 Malignant neoplasm of left kidney, except renal pelvis: Secondary | ICD-10-CM | POA: Diagnosis not present

## 2023-04-03 DIAGNOSIS — N1832 Chronic kidney disease, stage 3b: Secondary | ICD-10-CM | POA: Diagnosis not present

## 2023-04-03 DIAGNOSIS — H18413 Arcus senilis, bilateral: Secondary | ICD-10-CM | POA: Diagnosis not present

## 2023-04-04 DIAGNOSIS — N1832 Chronic kidney disease, stage 3b: Secondary | ICD-10-CM | POA: Diagnosis not present

## 2023-04-04 DIAGNOSIS — C642 Malignant neoplasm of left kidney, except renal pelvis: Secondary | ICD-10-CM | POA: Diagnosis not present

## 2023-04-04 DIAGNOSIS — Z4789 Encounter for other orthopedic aftercare: Secondary | ICD-10-CM | POA: Diagnosis not present

## 2023-04-04 DIAGNOSIS — G8918 Other acute postprocedural pain: Secondary | ICD-10-CM | POA: Diagnosis not present

## 2023-04-04 DIAGNOSIS — M150 Primary generalized (osteo)arthritis: Secondary | ICD-10-CM | POA: Diagnosis not present

## 2023-04-04 DIAGNOSIS — D509 Iron deficiency anemia, unspecified: Secondary | ICD-10-CM | POA: Diagnosis not present

## 2023-04-04 DIAGNOSIS — R2681 Unsteadiness on feet: Secondary | ICD-10-CM | POA: Diagnosis not present

## 2023-04-04 DIAGNOSIS — I130 Hypertensive heart and chronic kidney disease with heart failure and stage 1 through stage 4 chronic kidney disease, or unspecified chronic kidney disease: Secondary | ICD-10-CM | POA: Diagnosis not present

## 2023-04-04 DIAGNOSIS — I5022 Chronic systolic (congestive) heart failure: Secondary | ICD-10-CM | POA: Diagnosis not present

## 2023-04-08 ENCOUNTER — Telehealth: Payer: Self-pay

## 2023-04-08 DIAGNOSIS — D509 Iron deficiency anemia, unspecified: Secondary | ICD-10-CM | POA: Diagnosis not present

## 2023-04-08 DIAGNOSIS — M150 Primary generalized (osteo)arthritis: Secondary | ICD-10-CM | POA: Diagnosis not present

## 2023-04-08 DIAGNOSIS — R2681 Unsteadiness on feet: Secondary | ICD-10-CM | POA: Diagnosis not present

## 2023-04-08 DIAGNOSIS — N1832 Chronic kidney disease, stage 3b: Secondary | ICD-10-CM | POA: Diagnosis not present

## 2023-04-08 DIAGNOSIS — G8918 Other acute postprocedural pain: Secondary | ICD-10-CM | POA: Diagnosis not present

## 2023-04-08 DIAGNOSIS — I130 Hypertensive heart and chronic kidney disease with heart failure and stage 1 through stage 4 chronic kidney disease, or unspecified chronic kidney disease: Secondary | ICD-10-CM | POA: Diagnosis not present

## 2023-04-08 DIAGNOSIS — C642 Malignant neoplasm of left kidney, except renal pelvis: Secondary | ICD-10-CM | POA: Diagnosis not present

## 2023-04-08 DIAGNOSIS — Z4789 Encounter for other orthopedic aftercare: Secondary | ICD-10-CM | POA: Diagnosis not present

## 2023-04-08 DIAGNOSIS — I5022 Chronic systolic (congestive) heart failure: Secondary | ICD-10-CM | POA: Diagnosis not present

## 2023-04-08 NOTE — Telephone Encounter (Signed)
   Patient Name: Jesse [name]  DOB: 11/11/1950 MRN: [id number]  Primary Cardiologist: Maisie Fus, MD  Chart reviewed as part of pre-operative protocol coverage. Cataract extractions are recognized in guidelines as low risk surgeries that do not typically require specific preoperative testing or holding of blood thinner therapy. Therefore, given past medical history and time since last visit, based on ACC/AHA guidelines, Jesse [name] would be at acceptable risk for the planned procedure without further cardiovascular testing.   I will route this recommendation to the requesting party via Epic fax function and remove from pre-op pool.  Please call with questions.  Carlos Levering, NP 04/08/2023, 4:46 PM

## 2023-04-08 NOTE — Telephone Encounter (Signed)
   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

## 2023-04-10 ENCOUNTER — Other Ambulatory Visit: Payer: Self-pay | Admitting: Medical Genetics

## 2023-04-10 DIAGNOSIS — D631 Anemia in chronic kidney disease: Secondary | ICD-10-CM | POA: Diagnosis not present

## 2023-04-10 DIAGNOSIS — E1122 Type 2 diabetes mellitus with diabetic chronic kidney disease: Secondary | ICD-10-CM | POA: Diagnosis not present

## 2023-04-10 DIAGNOSIS — N1832 Chronic kidney disease, stage 3b: Secondary | ICD-10-CM | POA: Diagnosis not present

## 2023-04-10 DIAGNOSIS — C642 Malignant neoplasm of left kidney, except renal pelvis: Secondary | ICD-10-CM | POA: Diagnosis not present

## 2023-04-10 DIAGNOSIS — I130 Hypertensive heart and chronic kidney disease with heart failure and stage 1 through stage 4 chronic kidney disease, or unspecified chronic kidney disease: Secondary | ICD-10-CM | POA: Diagnosis not present

## 2023-04-10 DIAGNOSIS — N189 Chronic kidney disease, unspecified: Secondary | ICD-10-CM | POA: Diagnosis not present

## 2023-04-10 DIAGNOSIS — N2581 Secondary hyperparathyroidism of renal origin: Secondary | ICD-10-CM | POA: Diagnosis not present

## 2023-04-10 DIAGNOSIS — E559 Vitamin D deficiency, unspecified: Secondary | ICD-10-CM | POA: Diagnosis not present

## 2023-04-10 DIAGNOSIS — R600 Localized edema: Secondary | ICD-10-CM | POA: Diagnosis not present

## 2023-04-10 LAB — COMPREHENSIVE METABOLIC PANEL
Albumin: 4.1 (ref 3.5–5.0)
Calcium: 8.8 (ref 8.7–10.7)
eGFR: 36

## 2023-04-10 LAB — BASIC METABOLIC PANEL
BUN: 34 — AB (ref 4–21)
CO2: 27 — AB (ref 13–22)
Chloride: 101 (ref 99–108)
Creatinine: 1.9 — AB (ref 0.6–1.3)
Glucose: 190
Potassium: 4 meq/L (ref 3.5–5.1)
Sodium: 136 — AB (ref 137–147)

## 2023-04-10 LAB — IRON,TIBC AND FERRITIN PANEL
%SAT: 14
Ferritin: 21
Iron: 47
TIBC: 345
UIBC: 298

## 2023-04-10 LAB — VITAMIN D 25 HYDROXY (VIT D DEFICIENCY, FRACTURES): Vit D, 25-Hydroxy: 29.9

## 2023-04-10 LAB — CBC AND DIFFERENTIAL
HCT: 39 — AB (ref 41–53)
Hemoglobin: 12.4 — AB (ref 13.5–17.5)
Neutrophils Absolute: 4
Platelets: 210 10*3/uL (ref 150–400)
WBC: 6.2

## 2023-04-10 LAB — CBC: RBC: 4.27 (ref 3.87–5.11)

## 2023-04-11 ENCOUNTER — Other Ambulatory Visit: Payer: Self-pay | Admitting: Family

## 2023-04-11 ENCOUNTER — Ambulatory Visit: Payer: Medicare PPO | Admitting: Internal Medicine

## 2023-04-11 DIAGNOSIS — G8918 Other acute postprocedural pain: Secondary | ICD-10-CM | POA: Diagnosis not present

## 2023-04-11 DIAGNOSIS — Z4789 Encounter for other orthopedic aftercare: Secondary | ICD-10-CM | POA: Diagnosis not present

## 2023-04-11 DIAGNOSIS — R2681 Unsteadiness on feet: Secondary | ICD-10-CM | POA: Diagnosis not present

## 2023-04-11 DIAGNOSIS — C642 Malignant neoplasm of left kidney, except renal pelvis: Secondary | ICD-10-CM | POA: Diagnosis not present

## 2023-04-11 DIAGNOSIS — D509 Iron deficiency anemia, unspecified: Secondary | ICD-10-CM | POA: Diagnosis not present

## 2023-04-11 DIAGNOSIS — M150 Primary generalized (osteo)arthritis: Secondary | ICD-10-CM | POA: Diagnosis not present

## 2023-04-11 DIAGNOSIS — I5022 Chronic systolic (congestive) heart failure: Secondary | ICD-10-CM | POA: Diagnosis not present

## 2023-04-11 DIAGNOSIS — I130 Hypertensive heart and chronic kidney disease with heart failure and stage 1 through stage 4 chronic kidney disease, or unspecified chronic kidney disease: Secondary | ICD-10-CM | POA: Diagnosis not present

## 2023-04-11 DIAGNOSIS — N1832 Chronic kidney disease, stage 3b: Secondary | ICD-10-CM | POA: Diagnosis not present

## 2023-04-11 NOTE — Telephone Encounter (Signed)
 High risk or very high risk warning populated when attempting to refill medication. RX request sent to PCP for review and approval if warranted.

## 2023-04-12 ENCOUNTER — Ambulatory Visit: Payer: Medicare PPO | Admitting: Family

## 2023-04-12 ENCOUNTER — Ambulatory Visit: Payer: Medicare PPO | Admitting: Internal Medicine

## 2023-04-12 ENCOUNTER — Telehealth: Payer: Self-pay

## 2023-04-12 ENCOUNTER — Ambulatory Visit: Payer: Medicare PPO | Attending: Cardiology | Admitting: Internal Medicine

## 2023-04-12 DIAGNOSIS — C642 Malignant neoplasm of left kidney, except renal pelvis: Secondary | ICD-10-CM | POA: Diagnosis not present

## 2023-04-12 DIAGNOSIS — I5032 Chronic diastolic (congestive) heart failure: Secondary | ICD-10-CM

## 2023-04-12 DIAGNOSIS — M150 Primary generalized (osteo)arthritis: Secondary | ICD-10-CM | POA: Diagnosis not present

## 2023-04-12 DIAGNOSIS — Z4789 Encounter for other orthopedic aftercare: Secondary | ICD-10-CM | POA: Diagnosis not present

## 2023-04-12 DIAGNOSIS — N1832 Chronic kidney disease, stage 3b: Secondary | ICD-10-CM | POA: Diagnosis not present

## 2023-04-12 DIAGNOSIS — D509 Iron deficiency anemia, unspecified: Secondary | ICD-10-CM | POA: Diagnosis not present

## 2023-04-12 DIAGNOSIS — I130 Hypertensive heart and chronic kidney disease with heart failure and stage 1 through stage 4 chronic kidney disease, or unspecified chronic kidney disease: Secondary | ICD-10-CM | POA: Diagnosis not present

## 2023-04-12 DIAGNOSIS — R2681 Unsteadiness on feet: Secondary | ICD-10-CM | POA: Diagnosis not present

## 2023-04-12 DIAGNOSIS — I5022 Chronic systolic (congestive) heart failure: Secondary | ICD-10-CM | POA: Diagnosis not present

## 2023-04-12 DIAGNOSIS — G8918 Other acute postprocedural pain: Secondary | ICD-10-CM | POA: Diagnosis not present

## 2023-04-12 NOTE — Progress Notes (Addendum)
 Cardiology Office Note:    Date:  04/12/2023   ID:  SHAWNA KIENER, DOB [birth year], MRN [id number]  PCP:  Caesar Bookman, NP   [facility] Providers Cardiologist:  Maisie Fus, MD Electrophysiologist:  Sherryl Manges, MD     Referring MD: Caesar Bookman, NP   No chief complaint on file. HFpEF  History of Present Illness:    Jesse [name] is a 73 y.o. male with a hx of CKD3b, obesity,  bed/wheelchair bound,  hypertension, type 2 diabetes mellitus 5.9 %, sp colectomy/ colostomy, iron deficiency anemia on oral iron, OSA on CPAP, HFpEF  Per her EMR report. He presented with acute on chronic respiratory failure and noted LE edema. His BNP was 218. Hgb 9.1. He was diuresed. He had a large pleural effusion s/p thora yielding 1.2 L.  Fluid is hazy, cloudy and yellow. Cytology was negative. Had a repeat 2L drained. Non infectious.He was diuresed with IV Lasix he is  17 L negative, weight down 38 pounds. His dry weight is ~ 363 pounds. He takes lasix 40 mg BID.  Wt Readings from Last 3 Encounters:  02/04/23 (!) 380 lb (172.4 kg)  01/03/23 (!) 378 lb (171.5 kg)  12/26/22 (!) 375 lb (170.1 kg)   TC- 94 HDL 39 LDL 38 TSH normal  Crt 2.1 on 05/23/2021 from 1.8-2 , K 4.6  He notes PT looked at his legs prior to the hospital and she instructed him to seek care. This was the first time he was admitted for for decompensated HF. He's been in the hospital. 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  He has a large hernia.He has hx of  CKD3a/b. He was being seen in [place] for fluid retention related to kidney disease. He's been on metolazone and bumex in the past. He self adjusted his diuretics. He does not have scale. He sleeps in a recliner and hoping to trial his bed that's at an incline.  His L>R of his legs He had no DVT. Noted did not want DNR.  He has a living will. His son and wife are designated medical power of attorney.  His BP meds imdur and valsartan were  stopped with soft Bps.   Interim 12/28/2021 He was admitted in September with R facial droop with c/f possible TIA? Did not undergo MRI 2/2 concern for two different lead companies. Echo showed normal fxn. CT head was negative. Neurology evaluated him and recommended DAPT for 3 weeks and then plavix  Noted to have some weight gain; told him to double his lasix, returns for a visit. Blood pressures are well controlled. Saw Dr. Graciela Husbands 10/31 s/p PPM. No changes. He is on 160 mg lasix notes he is losing weight. He is 345 pounds; notes was 407 in March.    Wt Readings from Last 3 Encounters:  02/04/23 (!) 380 lb (172.4 kg)  01/03/23 (!) 378 lb (171.5 kg)  12/26/22 (!) 375 lb (170.1 kg)    Interim Hx 03/13/2022 He notes having prostate ?infection and dysuria. He lowered his lasix dose to 40 mg BID for a day or so. He also recently was on prednisone. He gained weight 338->348 per patient. He is asymptomatic. His care giver was concerned. He denies angina, dyspnea on exertion, lower extremity pitting edema, PND or orthopnea.   Interim 04/12/2023 Virtual Video Visit. Notes increased weight gain over the last 6 months. He stopped lasix and was recommended to start torsemide 50 mg BID. He  is also taking metolazone twice per week.  He denies increased O2 needs. He notes some increased SOB with transferring to his chair. He has BL edema.    Cardiology Studies: EKG 05/16/2021: junctional rhythm RBBB morphology RUE:AVWU quality very challenging to fully assess WMA. EF 60-65%, RV moderately enlarged. Svi 40 cc/m2. Dilate LA. Mean MV gradiient 5 mmHg rate 64 bpm noted has mild-mod MS. Mild AS. IVC dialted, with RA pressure of 15 mmHg. Diastolgy indeterminate. 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  CHB with junctional escape rhythm.   PPM DC Medtronic 09/15/2021  Past Medical History:  Diagnosis Date   Alcohol use disorder in remission 1999   Anemia     iron deficiency   Anxiety and depression    Arthritis    ankles, knees and hands   At high risk for falls    Basal cell carcinoma    x4 (on both shoulders, one on forehead, one on nose)   CKD stage 3b, GFR 30-44 ml/min (HCC)    Former cigarette smoker 1999   Generalized anxiety disorder    Hard of hearing    Headache    History of kidney stones    Hyperlipidemia due to type 2 diabetes mellitus (HCC)    Hypertension    Morbid obesity (HCC)    Neuropathy    O2 dependent    2L   Presence of permanent cardiac pacemaker    Renal cell carcinoma of left kidney (HCC)    pt had cryotherapy for this at [facility] 12/11/22)   Risk for falls    Sleep apnea    uses CPAP   TIA (transient ischemic attack)    pt states this was not confirmed due to MRI unable to be performed   Type 2 diabetes mellitus (HCC)    Ventral hernia with bowel obstruction    Per Hospital Encounter on 05/21/22    Past Surgical History:  Procedure Laterality Date   APPENDECTOMY  1980   BREAST SURGERY Left    "removed part of breast due to gynecomastia"   COLOSTOMY  1989   and reversal same year   COLOSTOMY  2017   FINGER ARTHRODESIS Right 02/04/2023   Procedure: RIGHT INDEX FINGER PROXIMAL INTERPHALANGEAL JOINT ARTHRODESIS;  Surgeon: Mack Hook, MD;  Location: [facility] OR;  Service: Orthopedics;  Laterality: Right;   IR THORACENTESIS ASP PLEURAL SPACE W/IMG GUIDE  05/18/2021   IR THORACENTESIS ASP PLEURAL SPACE W/IMG GUIDE  10/06/2021   KNEE SURGERY Right    NASAL SEPTUM SURGERY  1982   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   Procedure: PACEMAKER IMPLANT;  Surgeon: Duke Salvia, MD;  Location: [facility] INVASIVE CV LAB;  Service: Cardiovascular;  Laterality: N/A;   RENAL CRYOABLATION  12/11/2022   VARICOCELECTOMY  1983    Current Medications: Current Meds  Medication Sig   torsemide (DEMADEX) 20 MG tablet Take 2.5 tablets (50 mg total) by mouth daily.     Allergies:   Tape   Social History   Socioeconomic  History   Marital status: Divorced    Spouse name: Not on file   Number of children: 3   Years of education: Not on file   Highest education level: Master's degree (e.g., MA, MS, MEng, MEd, MSW, MBA)  Occupational History   Occupation: Retired  Tobacco Use   Smoking status: Former    Current packs/day: 0.00    Types: Cigarettes    Quit  date: 87    Years since quitting: 26.1   Smokeless tobacco: Never  Vaping Use   Vaping status: Never Used  Substance and Sexual Activity   Alcohol use: Not Currently    Comment: no alcohol since 1999   Drug use: Never   Sexual activity: Not on file  Other Topics Concern   Not on file  Social History Narrative   Tobacco use, amount per day now: 0   Past tobacco use, amount per day: 1 pack   How many years did you use tobacco: 10 last 1999   Alcohol use (drinks per week): 1/5 day last 1999   Diet:   Do you drink/eat things with caffeine: Yes   Marital status:   Divorced                               What year were you married? 1980   Do you live in a house, apartment, assisted living, condo, trailer, etc.?    Is it one or more stories? 1   How many persons live in your home? 1   Do you have pets in your home?( please list) Cat   Highest Level of education completed? Post Grad   Current or past profession: Engineer, manufacturing.   Do you exercise? Little                                 Type and how often?   Do you have a living will? Yes   Do you have a DNR form?        No                           If not, do you want to discuss one?   Do you have signed POA/HPOA forms?   Yes                     If so, please bring to you appointment      Do you have any difficulty bathing or dressing yourself? Yes   Do you have any difficulty preparing food or eating? No   Do you have any difficulty managing your medications? No   Do you have any difficulty managing your finances? No   Do you have any difficulty affording your medications?  No   Social  Drivers of Corporate investment banker Strain: Low Risk  (05/15/2021)   Overall Financial Resource Strain (CARDIA)    Difficulty of Paying Living Expenses: Not hard at all  Food Insecurity: No Food Insecurity (09/28/2022)   Hunger Vital Sign    Worried About Running Out of Food in the Last Year: Never true    Ran Out of Food in the Last Year: Never true  Transportation Needs: No Transportation Needs (09/28/2022)   PRAPARE - Administrator, Civil Service (Medical): No    Lack of Transportation (Non-Medical): No  Physical Activity: Not on file  Stress: Not on file  Social Connections: Not on file     Family History: The patient's family history includes Anxiety disorder in his son; Bipolar disorder in his daughter; Diabetes in his brother.  ROS:   Please see the history of present illness.     All other systems reviewed and are negative.  EKGs/Labs/Other Studies Reviewed:  The following studies were reviewed today:   Recent Labs: 09/27/2022: Magnesium 2.7 10/09/2022: ALT 11; TSH 1.05 02/04/2023: BUN 35; Creatinine, Ser 2.19; Hemoglobin 11.9; Platelets 223; Potassium 3.0; Sodium 132  Recent Lipid Panel    Component Value Date/Time   CHOL 152 10/09/2022 1200   TRIG 168 (H) 10/09/2022 1200   HDL 50 10/09/2022 1200   CHOLHDL 3.0 10/09/2022 1200   VLDL 14 11/02/2021 0451   LDLCALC 75 10/09/2022 1200     Risk Assessment/Calculations:           Physical Exam:    VS:   Wt Readings from Last 3 Encounters:  02/04/23 (!) 380 lb (172.4 kg)  01/03/23 (!) 378 lb (171.5 kg)  12/26/22 (!) 375 lb (170.1 kg)     Well appearing MMM Normal WOB on O2 No abdominal distension Normal Affect     ASSESSMENT:    HFpeF/Morbid Obesity: He is at his baseline today. He's on 2 L at home likely has obesity hypoventilation component for SOB. Pulmonary HTN Group II and III. His RV is mildly enlarged. Will be important to continue his CPAP and O2 to keep his PA pressures down.  His  A1c goal < 7%, on SGLT2.  --> lasix 80 mg BID changed to torsemide 50 mg BID; also metolazone twice weekly with reported weight gain. This is not uncommon for him. Does note worsening LE swelling can FU on this. No worsening dyspnea or O2 demand. - continue compression stockings - continue Jardiance 25 mg daily  S/p ?CVA: continue plavix. No hx of afib  Mild Aortic Root Anuerysm: Yearly surveillance. 3.2024  HLD- continue crestor 10 mg daily. Lipids at goal.  SSS: s/p PPM 09/15/2021, DDD. St. Jude.  Has medtronic and St. Jude leads, both MRI. compatible. follow by Ep. First-degree AV block and intermittent second and third-degree AV block junctional escape   HTN: continue imdur 60 mg daily, losartan 25 mg daily  CKD III/RCC s/p cryoablation: crt bsl 2.2-2.4   PLAN:    In order of problems listed above:   Follow up in 2 weeks virtual FU Recommmend BMET, BNP at [facility]  Virtual Visit via Video Note Provider Location: Home Patient Location : Home   I connected with [name] on 04/23/23 at  9:40 AM EST by a video enabled telemedicine application and verified that I am speaking with the correct person using two identifiers.    I discussed the limitations of evaluation and management by telemedicine and the availability of in person appointments. The patient expressed understanding and agreed to proceed.  I discussed the assessment and treatment plan with the patient. The patient was provided an opportunity to ask questions and all were answered. The patient agreed with the plan and demonstrated an understanding of the instructions.   The patient was advised to call back or seek an in-person evaluation if the symptoms worsen or if the condition fails to improve as anticipated.  I provided 15 minutes of non-face-to-face time during this encounter.   Medication Adjustments/Labs and Tests Ordered: Current medicines are reviewed at length with the patient  today.  Concerns regarding medicines are outlined above.  No orders of the defined types were placed in this encounter.  No orders of the defined types were placed in this encounter.   There are no Patient Instructions on file for this visit.   Signed, Maisie Fus, MD  04/12/2023 9:27 AM    [facility]

## 2023-04-12 NOTE — Telephone Encounter (Signed)
  Patient Consent for Virtual Visit        Jesse [name] has provided verbal consent on 04/12/2023 for a virtual visit (video or telephone).   CONSENT FOR VIRTUAL VISIT FOR:  Jesse [name]  By participating in this virtual visit I agree to the following:  I hereby voluntarily request, consent and authorize [facility] and its employed or contracted physicians, physician assistants, nurse practitioners or other licensed health care professionals (the Practitioner), to provide me with telemedicine health care services (the "Services") as deemed necessary by the treating Practitioner. I acknowledge and consent to receive the Services by the Practitioner via telemedicine. I understand that the telemedicine visit will involve communicating with the Practitioner through live audiovisual communication technology and the disclosure of certain medical information by electronic transmission. I acknowledge that I have been given the opportunity to request an in-person assessment or other available alternative prior to the telemedicine visit and am voluntarily participating in the telemedicine visit.  I understand that I have the right to withhold or withdraw my consent to the use of telemedicine in the course of my care at any time, without affecting my right to future care or treatment, and that the Practitioner or I may terminate the telemedicine visit at any time. I understand that I have the right to inspect all information obtained and/or recorded in the course of the telemedicine visit and may receive copies of available information for a reasonable fee.  I understand that some of the potential risks of receiving the Services via telemedicine include:  Delay or interruption in medical evaluation due to technological equipment failure or disruption; Information transmitted may not be sufficient (e.g. poor resolution of images) to allow for appropriate medical decision making by the  Practitioner; and/or  In rare instances, security protocols could fail, causing a breach of personal health information.  Furthermore, I acknowledge that it is my responsibility to provide information about my medical history, conditions and care that is complete and accurate to the best of my ability. I acknowledge that Practitioner's advice, recommendations, and/or decision may be based on factors not within their control, such as incomplete or inaccurate data provided by me or distortions of diagnostic images or specimens that may result from electronic transmissions. I understand that the practice of medicine is not an exact science and that Practitioner makes no warranties or guarantees regarding treatment outcomes. I acknowledge that a copy of this consent can be made available to me via my patient portal [facility] MyChart), or I can request a printed copy by calling the office of [facility].    I understand that my insurance will be billed for this visit.   I have read or had this consent read to me. I understand the contents of this consent, which adequately explains the benefits and risks of the Services being provided via telemedicine.  I have been provided ample opportunity to ask questions regarding this consent and the Services and have had my questions answered to my satisfaction. I give my informed consent for the services to be provided through the use of telemedicine in my medical care

## 2023-04-12 NOTE — Patient Instructions (Signed)
 Medication Instructions:  Your physician recommends that you continue on your current medications as directed. Please refer to the Current Medication list given to you today.    *If you need a refill on your cardiac medications before your next appointment, please call your pharmacy*   Lab Work: None    If you have labs (blood work) drawn today and your tests are completely normal, you will receive your results only by: MyChart Message (if you have MyChart) OR A paper copy in the mail If you have any lab test that is abnormal or we need to change your treatment, we will call you to review the results.   Testing/Procedures: None    Follow-Up: At [facility], you and your health needs are our priority.  As part of our continuing mission to provide you with exceptional heart care, we have created designated Provider Care Teams.  These Care Teams include your primary Cardiologist (physician) and Advanced Practice Providers (APPs -  Physician Assistants and Nurse Practitioners) who all work together to provide you with the care you need, when you need it.  We recommend signing up for the patient portal called "MyChart".  Sign up information is provided on this After Visit Summary.  MyChart is used to connect with patients for Virtual Visits (Telemedicine).  Patients are able to view lab/test results, encounter notes, upcoming appointments, etc.  Non-urgent messages can be sent to your provider as well.   To learn more about what you can do with MyChart, go to [email or web address].    Your next appointment:   2 week(s)  The format for your next appointment:   Virtual Visit   Provider:   Maisie Fus, MD    Other Instructions

## 2023-04-13 ENCOUNTER — Other Ambulatory Visit: Payer: Self-pay | Admitting: Family

## 2023-04-13 DIAGNOSIS — F411 Generalized anxiety disorder: Secondary | ICD-10-CM

## 2023-04-15 ENCOUNTER — Encounter: Payer: Self-pay | Admitting: Family

## 2023-04-15 DIAGNOSIS — I4819 Other persistent atrial fibrillation: Secondary | ICD-10-CM | POA: Diagnosis not present

## 2023-04-15 DIAGNOSIS — I5022 Chronic systolic (congestive) heart failure: Secondary | ICD-10-CM | POA: Diagnosis not present

## 2023-04-15 DIAGNOSIS — G8918 Other acute postprocedural pain: Secondary | ICD-10-CM | POA: Diagnosis not present

## 2023-04-15 DIAGNOSIS — Z4789 Encounter for other orthopedic aftercare: Secondary | ICD-10-CM | POA: Diagnosis not present

## 2023-04-15 DIAGNOSIS — R2681 Unsteadiness on feet: Secondary | ICD-10-CM | POA: Diagnosis not present

## 2023-04-15 DIAGNOSIS — N1832 Chronic kidney disease, stage 3b: Secondary | ICD-10-CM | POA: Diagnosis not present

## 2023-04-15 DIAGNOSIS — D509 Iron deficiency anemia, unspecified: Secondary | ICD-10-CM | POA: Diagnosis not present

## 2023-04-15 DIAGNOSIS — I130 Hypertensive heart and chronic kidney disease with heart failure and stage 1 through stage 4 chronic kidney disease, or unspecified chronic kidney disease: Secondary | ICD-10-CM | POA: Diagnosis not present

## 2023-04-15 DIAGNOSIS — C642 Malignant neoplasm of left kidney, except renal pelvis: Secondary | ICD-10-CM | POA: Diagnosis not present

## 2023-04-15 DIAGNOSIS — G63 Polyneuropathy in diseases classified elsewhere: Secondary | ICD-10-CM | POA: Diagnosis not present

## 2023-04-15 DIAGNOSIS — M150 Primary generalized (osteo)arthritis: Secondary | ICD-10-CM | POA: Diagnosis not present

## 2023-04-15 NOTE — Telephone Encounter (Signed)
 Patient has request refill on medication Xanax. Patient medication last refilled 10/01/2022. Patient has Non opioid contract on file dated 01/09/2021. Update Contract added to upcoming appointment note 04/16/2023. Medication pend and sent to PCP Ngetich, [name], NP for approval.

## 2023-04-16 ENCOUNTER — Encounter: Payer: Self-pay | Admitting: Family

## 2023-04-16 ENCOUNTER — Ambulatory Visit: Payer: Self-pay

## 2023-04-16 ENCOUNTER — Ambulatory Visit: Payer: Medicare PPO | Admitting: Family

## 2023-04-16 VITALS — BP 114/70 | HR 81 | Temp 97.9°F | Resp 20 | Ht 72.0 in | Wt 388.0 lb

## 2023-04-16 DIAGNOSIS — I5022 Chronic systolic (congestive) heart failure: Secondary | ICD-10-CM | POA: Diagnosis not present

## 2023-04-16 DIAGNOSIS — E1142 Type 2 diabetes mellitus with diabetic polyneuropathy: Secondary | ICD-10-CM

## 2023-04-16 DIAGNOSIS — N184 Chronic kidney disease, stage 4 (severe): Secondary | ICD-10-CM | POA: Diagnosis not present

## 2023-04-16 DIAGNOSIS — D509 Iron deficiency anemia, unspecified: Secondary | ICD-10-CM | POA: Diagnosis not present

## 2023-04-16 DIAGNOSIS — Z95811 Presence of heart assist device: Secondary | ICD-10-CM | POA: Diagnosis not present

## 2023-04-16 DIAGNOSIS — I4819 Other persistent atrial fibrillation: Secondary | ICD-10-CM | POA: Diagnosis not present

## 2023-04-16 DIAGNOSIS — J9611 Chronic respiratory failure with hypoxia: Secondary | ICD-10-CM

## 2023-04-16 DIAGNOSIS — Z4789 Encounter for other orthopedic aftercare: Secondary | ICD-10-CM | POA: Diagnosis not present

## 2023-04-16 DIAGNOSIS — G8929 Other chronic pain: Secondary | ICD-10-CM

## 2023-04-16 DIAGNOSIS — F321 Major depressive disorder, single episode, moderate: Secondary | ICD-10-CM

## 2023-04-16 DIAGNOSIS — R2681 Unsteadiness on feet: Secondary | ICD-10-CM | POA: Diagnosis not present

## 2023-04-16 DIAGNOSIS — N1832 Chronic kidney disease, stage 3b: Secondary | ICD-10-CM | POA: Diagnosis not present

## 2023-04-16 DIAGNOSIS — C642 Malignant neoplasm of left kidney, except renal pelvis: Secondary | ICD-10-CM | POA: Diagnosis not present

## 2023-04-16 DIAGNOSIS — G8918 Other acute postprocedural pain: Secondary | ICD-10-CM | POA: Diagnosis not present

## 2023-04-16 DIAGNOSIS — K7460 Unspecified cirrhosis of liver: Secondary | ICD-10-CM | POA: Insufficient documentation

## 2023-04-16 DIAGNOSIS — N410 Acute prostatitis: Secondary | ICD-10-CM

## 2023-04-16 DIAGNOSIS — M25572 Pain in left ankle and joints of left foot: Secondary | ICD-10-CM

## 2023-04-16 DIAGNOSIS — Z794 Long term (current) use of insulin: Secondary | ICD-10-CM | POA: Diagnosis not present

## 2023-04-16 DIAGNOSIS — M150 Primary generalized (osteo)arthritis: Secondary | ICD-10-CM | POA: Diagnosis not present

## 2023-04-16 DIAGNOSIS — I130 Hypertensive heart and chronic kidney disease with heart failure and stage 1 through stage 4 chronic kidney disease, or unspecified chronic kidney disease: Secondary | ICD-10-CM | POA: Diagnosis not present

## 2023-04-16 MED ORDER — HYDROCODONE-ACETAMINOPHEN 5-325 MG PO TABS
1.0000 | ORAL_TABLET | Freq: Every evening | ORAL | 0 refills | Status: DC | PRN
Start: 2023-04-16 — End: 2023-07-02

## 2023-04-16 NOTE — Progress Notes (Signed)
 Provider: Richarda Blade FNP-C   [name], [name], NP  Patient Care Team: [name], [name], NP as PCP - General (Family Medicine) Maisie Fus, MD as PCP - Cardiology (Cardiology) Duke Salvia, MD as PCP - Electrophysiology (Cardiology) Mack Hook, MD as Consulting Physician (Orthopedic Surgery)  Extended Emergency Contact Information Primary Emergency Contact: [name],[name] Mobile Phone: [phone] Relation: Daughter Preferred language: English Interpreter needed? No Secondary Emergency Contact: [name],[name] Mobile Phone: [phone] Relation: Daughter  Code Status:  Full Code  Goals of care: Advanced Directive information    04/16/2023   11:02 AM  Advanced Directives  Does Patient Have a Medical Advance Directive? Yes  Type of Estate agent of [place];Living will  Does patient want to make changes to medical advance directive? No - Patient declined  Copy of Healthcare Power of Attorney in Chart? Yes - validated most recent copy scanned in chart (See row information)     Chief Complaint  Patient presents with   Medical Management of Chronic Issues    6 month follow up and discuss tdap,covid,and flu vaccines.Update contract.      Discussed the use of AI scribe software for clinical note transcription with the patient, who gave verbal consent to proceed.  History of Present Illness   Jesse LESER "MICHAEL" is a 73 year old male who presents for a six-month follow-up visit.  He recently switched from furosemide to torsemide, taking 50 mg twice daily, resulting in a 10-pound weight loss over the past week, primarily water weight. He notes improved stool consistency, which is easier to manage with his colostomy. His current medication regimen includes red yeast, potassium (two large pills twice a day, with an extra on metolazone days), alfuzosin 10 mg, Protonix, tizanidine as needed, Eliquis 5 mg twice a day, ketoconazole cream, Novolog via  Omnipod pump (200 units every 1.5 to 2 days), sertraline 2.5 mg, ropinirole, Mounjaro 5 mg weekly, Imdur 60 mg, vitamin D (50,000 mg weekly and 2,000 mg daily), ferrous sulfate, B12, Cymbalta 60 mg, Topamax 25 mg, tramadol 50 mg, metoprolol, rosuvastatin, trazodone 50 mg, Xanax 0.5 mg, and doxycycline for 42 days for prostatitis.  He has a history of a tumor in his left kidney, treated with cryoablation in October. A follow-up in early February suggested the tumor may not have been fully ablated or is regrowing. He is scheduled for a consultation regarding potential radiation treatment or kidney removal.  He experiences chronic pain in his left ankle, which has been degenerating over the past 20 years due to osteoarthritis. The ankle is turning further, causing significant pain and impacting his ability to stand and balance. He is working with physical therapy to maintain leg strength for safe transfers.  He recently re-injured his right trapezius muscle, initially torn last year, while working with physical and occupational therapy. He uses tizanidine for muscle relaxation and Voltaren gel sparingly on his knee due to its aspirin base.  He has been experiencing high blood sugar levels, with a reading of 240 this morning, attributed to eating a banana during the night due to leg cramps and pain. His blood sugar levels are usually between 150 to 170 with the pump.  He has a history of cataract surgery scheduled for April 1st.  A recent CT scan noted chronic liver disease, and he reports a small pleural effusion on the left side, which has been present for a long time. He denies any cough, wheezing or shortness of breath.  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)    Former cigarette smoker 1999   Generalized anxiety disorder    Hard of hearing    Headache    History of kidney stones    Hyperlipidemia due to type 2 diabetes mellitus (HCC)    Hypertension    Morbid obesity (HCC)    Neuropathy    O2 dependent    2L   Presence of permanent cardiac pacemaker    Renal cell carcinoma of left kidney (HCC)    pt had cryotherapy for this at [facility] 12/11/22)   Risk for falls    Sleep apnea    uses CPAP   TIA (transient ischemic attack)    pt states this was not confirmed due to MRI unable to be performed   Type 2 diabetes mellitus (HCC)    Ventral hernia with bowel obstruction    Per Hospital Encounter on 05/21/22   Past Surgical History:  Procedure Laterality Date   APPENDECTOMY  1980   BREAST SURGERY Left    "removed part of breast due to gynecomastia"   COLOSTOMY  1989   and reversal same year   COLOSTOMY  2017   FINGER ARTHRODESIS Right 02/04/2023   Procedure: RIGHT INDEX FINGER PROXIMAL INTERPHALANGEAL JOINT ARTHRODESIS;  Surgeon: Mack Hook, MD;  Location: [facility] OR;  Service: Orthopedics;  Laterality: Right;   IR THORACENTESIS ASP PLEURAL SPACE W/IMG GUIDE  05/18/2021   IR THORACENTESIS ASP PLEURAL SPACE W/IMG GUIDE  10/06/2021   KNEE SURGERY Right    NASAL SEPTUM SURGERY  1982   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   Procedure: PACEMAKER IMPLANT;  Surgeon: Duke Salvia, MD;  Location: [facility] INVASIVE CV LAB;  Service: Cardiovascular;  Laterality: N/A;   RENAL CRYOABLATION  12/11/2022   VARICOCELECTOMY  1983    Allergies  Allergen Reactions   Tape Rash    If left on for a long time    Allergies as of 04/16/2023       Reactions   Tape Rash   If left on for a long time        Medication List        Accurate as of April 16, 2023  4:18 PM. If you have any questions, ask your nurse or doctor.          STOP taking these medications    Accu-Chek Guide test strip Generic  drug: glucose blood Stopped by: [name] [name]   oxyCODONE 5 MG immediate release tablet Commonly known as: Roxicodone Stopped by: [name] [name]       TAKE these medications    acetaminophen 325 MG tablet Commonly known as: Tylenol Take 2 tablets (650 mg total) by mouth every 6 (six) hours.   alfuzosin 10 MG 24 hr tablet Commonly known as: UROXATRAL TAKE 1 TABLET BY MOUTH EVERY DAY   ALPRAZolam 0.5 MG tablet Commonly known as: XANAX TAKE 1 TABLET(0.5 MG) BY MOUTH TWICE DAILY AS NEEDED FOR ANXIETY OR SLEEP   B-12 PO Take 1 tablet by mouth daily.   CRANBERRY PO Take 1 tablet by mouth  daily.   Dexcom G7 Sensor Misc 1 Device by Does not apply route as directed.   diclofenac Sodium 1 % Gel Commonly known as: VOLTAREN Apply 2 g topically 4 (four) times daily. Apply to your right neck and shoulder area 3-4 times a day as needed What changed:  when to take this reasons to take this   doxycycline 100 MG tablet Commonly known as: ADOXA Take 100 mg by mouth 2 (two) times daily.   DULoxetine 60 MG capsule Commonly known as: CYMBALTA TAKE 1 CAPSULE(60 MG) BY MOUTH DAILY   Eliquis 5 MG Tabs tablet Generic drug: apixaban TAKE 1 TABLET(5 MG) BY MOUTH TWICE DAILY   HYDROcodone-acetaminophen 5-325 MG tablet Commonly known as: NORCO/VICODIN Take 1 tablet by mouth at bedtime as needed for moderate pain (pain score 4-6). Started by: [name] Merranda Bolls   insulin aspart 100 UNIT/ML injection Commonly known as: novoLOG Max daily 100 units   IRON PO Take 1 tablet by mouth daily.   isosorbide mononitrate 60 MG 24 hr tablet Commonly known as: IMDUR TAKE 1 TABLET(60 MG) BY MOUTH DAILY   ketoconazole 2 % cream Commonly known as: NIZORAL APPLY TOPICALLY TO THE AFFECTED AREA AS NEEDED FOR IRRITATION   methocarbamol 500 MG tablet Commonly known as: ROBAXIN TAKE 1 TABLET(500 MG) BY MOUTH EVERY 8 HOURS AS NEEDED FOR MUSCLE SPASMS   metolazone 2.5 MG tablet Commonly known  as: ZAROXOLYN Take 1 tablet (2.5 mg total) by mouth daily. Take 1-2 times a week as needed , spacing out 3 days between doses   Omnipod 5 G7 Intro (Gen 5) Kit 1 Device by Does not apply route every other day.   Omnipod 5 G7 Pods (Gen 5) Misc 1 Device by Does not apply route every other day.   ondansetron 4 MG tablet Commonly known as: ZOFRAN TAKE 1 TABLET(4 MG) BY MOUTH EVERY 8 HOURS AS NEEDED FOR NAUSEA OR VOMITING   OXYGEN Inhale 2 L into the lungs continuous.   pantoprazole 40 MG tablet Commonly known as: PROTONIX TAKE 1 TABLET(40 MG) BY MOUTH TWICE DAILY BEFORE A MEAL   potassium chloride SA 20 MEQ tablet Commonly known as: KLOR-CON M Take 1 tablet (20 mEq total) by mouth 2 (two) times daily.   Red Yeast Rice 600 MG Caps Take 1,200 mg by mouth daily.   rOPINIRole 2 MG tablet Commonly known as: REQUIP TAKE 1 TABLET(2 MG) BY MOUTH AT BEDTIME   rosuvastatin 10 MG tablet Commonly known as: CRESTOR TAKE 1 TABLET BY MOUTH EVERY DAY   tirzepatide 5 MG/0.5ML Pen Commonly known as: MOUNJARO Inject 5 mg into the skin once a week.   tiZANidine 2 MG tablet Commonly known as: ZANAFLEX TAKE 1 TABLET BY MOUTH TWICE DAILY What changed:  when to take this reasons to take this   topiramate 25 MG tablet Commonly known as: TOPAMAX TAKE 1 TABLET BY MOUTH EVERY DAY   torsemide 20 MG tablet Commonly known as: DEMADEX Take 2.5 tablets (50 mg total) by mouth daily.   traMADol 50 MG tablet Commonly known as: ULTRAM TAKE 1 TABLET(50 MG) BY MOUTH TWICE DAILY AS NEEDED   traZODone 50 MG tablet Commonly known as: DESYREL TAKE 2 TABLETS(100 MG) BY MOUTH AT BEDTIME   VITAMIN C PO Take 1 tablet by mouth daily.   Vitamin D (Ergocalciferol) 1.25 MG (50000 UNIT) Caps capsule Commonly known as: DRISDOL TAKE 1 CAPSULE BY MOUTH EVERY 7 DAYS        Review of Systems  Constitutional:  Negative for appetite change, chills, fatigue, fever and unexpected weight change.  HENT:   Negative for congestion, dental problem, ear discharge, ear pain, facial swelling, hearing loss, nosebleeds, postnasal drip, rhinorrhea, sinus pressure, sinus pain, sneezing, sore throat, tinnitus and trouble swallowing.   Eyes:  Negative for pain, discharge, redness, itching and visual disturbance.       Has upcoming cataract surgery in April,2025   Respiratory:  Negative for cough, chest tightness, shortness of breath and wheezing.   Cardiovascular:  Negative for chest pain, palpitations and leg swelling.  Gastrointestinal:  Negative for abdominal distention, abdominal pain, blood in stool, constipation, diarrhea, nausea and vomiting.       Colostomy   Endocrine: Negative for cold intolerance, heat intolerance, polydipsia, polyphagia and polyuria.  Genitourinary:  Negative for difficulty urinating, dysuria, flank pain, frequency and urgency.  Musculoskeletal:  Positive for arthralgias and gait problem. Negative for back pain, joint swelling, myalgias, neck pain and neck stiffness.       Left chronic ankle worsening pain   Skin:  Negative for color change, pallor, rash and wound.  Neurological:  Negative for dizziness, syncope, speech difficulty, weakness, light-headedness, numbness and headaches.  Hematological:  Does not bruise/bleed easily.  Psychiatric/Behavioral:  Negative for agitation, behavioral problems, confusion, hallucinations and sleep disturbance. The patient is not nervous/anxious.     Immunization History  Administered Date(s) Administered   Fluad Quad(high Dose 65+) 11/19/2020, 11/02/2021   H1N1 01/24/2008   Influenza, High Dose Seasonal PF 10/28/2022   Moderna Sars-Covid-2 Vaccination 04/03/2019, 05/05/2019, 12/20/2019, 05/23/2020   PNEUMOCOCCAL CONJUGATE-20 06/28/2022   Pfizer Covid-19 Vaccine Bivalent Booster 69yrs & up 12/11/2020   Pfizer(Comirnaty)Fall Seasonal Vaccine 12 years and older 11/19/2021   Pneumococcal Conjugate,unspecified 10/28/2022   Rsv, Bivalent,  Protein Subunit Rsvpref,pf Verdis Frederickson) 11/19/2021   Tdap 07/06/2022   Unspecified SARS-COV-2 Vaccination 10/28/2022   Zoster Recombinant(Shingrix) 03/01/2020, 05/23/2020   Pertinent  Health Maintenance Due  Topic Date Due   OPHTHALMOLOGY EXAM  06/21/2023   HEMOGLOBIN A1C  07/03/2023   FOOT EXAM  10/01/2023   INFLUENZA VACCINE  Completed   Colonoscopy  Discontinued      02/13/2022    8:00 AM 02/20/2022    1:51 PM 06/28/2022   11:22 AM 10/09/2022   11:29 AM 02/27/2023    2:18 PM  Fall Risk  Falls in the past year?  0 0 0 0  Was there an injury with Fall?  0 0 0 0  Fall Risk Category Calculator  0 0 0 0  Fall Risk Category (Retired)  Low     (RETIRED) Patient Fall Risk Level Moderate fall risk Low fall risk     Patient at Risk for Falls Due to  No Fall Risks No Fall Risks No Fall Risks No Fall Risks  Fall risk Follow up  Falls evaluation completed Falls evaluation completed Falls evaluation completed Falls evaluation completed   Functional Status Survey:    Vitals:   04/16/23 1118  BP: 114/70  Pulse: 81  Resp: 20  Temp: 97.9 F (36.6 C)  SpO2: 93%  Weight: (!) 388 lb (176 kg)  Height: 6' (1.829 m)   Body mass index is 52.62 kg/m. Physical Exam Vitals reviewed.  Constitutional:      General: He is not in acute distress.    Appearance: Normal appearance. He is morbidly obese. He is not ill-appearing or diaphoretic.  HENT:     Head: Normocephalic.     Right Ear: Tympanic membrane, ear  canal and external ear normal. There is no impacted cerumen.     Left Ear: Tympanic membrane, ear canal and external ear normal. There is no impacted cerumen.     Nose: Nose normal. No congestion or rhinorrhea.     Mouth/Throat:     Mouth: Mucous membranes are moist.     Pharynx: Oropharynx is clear. No oropharyngeal exudate or posterior oropharyngeal erythema.  Eyes:     General: No scleral icterus.       Right eye: No discharge.        Left eye: No discharge.     Extraocular Movements:  Extraocular movements intact.     Conjunctiva/sclera: Conjunctivae normal.     Pupils: Pupils are equal, round, and reactive to light.  Neck:     Vascular: No carotid bruit.  Cardiovascular:     Rate and Rhythm: Normal rate and regular rhythm.     Pulses: Normal pulses.     Heart sounds: Normal heart sounds. No murmur heard.    No friction rub. No gallop.  Pulmonary:     Effort: Pulmonary effort is normal. No respiratory distress.     Breath sounds: Normal breath sounds. No wheezing, rhonchi or rales.  Chest:     Chest wall: No tenderness.  Abdominal:     General: Bowel sounds are normal. There is no distension.     Palpations: Abdomen is soft. There is no mass.     Tenderness: There is no abdominal tenderness. There is no right CVA tenderness, left CVA tenderness, guarding or rebound.  Musculoskeletal:        General: No swelling or tenderness.     Cervical back: Normal range of motion. No rigidity or tenderness.     Right lower leg: No edema.     Left lower leg: No edema.     Right ankle: No swelling or ecchymosis. No tenderness. Decreased range of motion.     Left ankle: Deformity present. No swelling or ecchymosis. No tenderness. Decreased range of motion.  Lymphadenopathy:     Cervical: No cervical adenopathy.  Skin:    General: Skin is warm and dry.     Coloration: Skin is not pale.     Findings: No bruising, erythema, lesion or rash.  Neurological:     Mental Status: He is alert and oriented to person, place, and time.     Cranial Nerves: No cranial nerve deficit.     Sensory: No sensory deficit.     Motor: No weakness.     Coordination: Coordination normal.     Gait: Gait abnormal.     Comments: On power wheelchair   Psychiatric:        Mood and Affect: Mood normal.        Speech: Speech normal.        Behavior: Behavior normal.        Thought Content: Thought content normal.        Judgment: Judgment normal.      Labs reviewed: Recent Labs     05/23/22 0348 06/06/22 0000 09/26/22 0452 09/27/22 0606 10/09/22 1200 12/18/22 0000 02/04/23 0948 04/10/23 0000  NA 137   < > 143 135 138 136* 132* 136*  K 3.5   < > 3.6 3.2* 3.9 3.9 3.0* 4.0  CL 105   < > 110 102 102 103 98 101  CO2 21*   < > 23 23 25  23* 23 27*  GLUCOSE 182*   < > 216*  185* 163*  --  196*  --   BUN 31*   < > 33* 27* 31* 36* 35* 34*  CREATININE 1.91*   < > 1.81* 1.75* 1.85* 1.7* 2.19* 1.9*  CALCIUM 8.5*   < > 8.6* 8.3* 9.1 8.7 8.4* 8.8  MG 2.2  --  3.0* 2.7*  --   --   --   --   PHOS  --   --   --  1.8*  --   --   --   --    < > = values in this interval not displayed.   Recent Labs    06/06/22 0000 09/24/22 0155 09/27/22 0606 10/09/22 1200 12/18/22 0000 04/10/23 0000  AST 13* 15 15 13   --   --   ALT 11 16 12 11   --   --   ALKPHOS 96 85 74  --   --   --   BILITOT  --  0.6 0.8 0.4  --   --   PROT  --  6.9 6.8 6.8  --   --   ALBUMIN 4.0 3.5 3.3*  --  4.1 4.1   Recent Labs    09/27/22 0606 10/09/22 1200 12/18/22 0000 02/04/23 0948 04/10/23 0000  WBC 6.5 7.3 8.6 6.7 6.2  NEUTROABS 4.2 5,570 6.20  --  4.00  HGB 11.6* 12.4* 11.4* 11.9* 12.4*  HCT 39.4 39.9 36* 37.8* 39*  MCV 90.0 87.3  --  88.3  --   PLT 212 263 267 223 210   Lab Results  Component Value Date   TSH 1.05 10/09/2022   Lab Results  Component Value Date   HGBA1C 7.5 (A) 01/03/2023   Lab Results  Component Value Date   CHOL 152 10/09/2022   HDL 50 10/09/2022   LDLCALC 75 10/09/2022   TRIG 168 (H) 10/09/2022   CHOLHDL 3.0 10/09/2022    Significant Diagnostic Results in last 30 days:  No results found.  Assessment/Plan  Chronic Pain Chronic pain in the left ankle and knee, likely due to osteoarthritis. Significant pain interferes with sleep and daily activities. Previous tramadol treatment ineffective. Hydrocodone previously effective; discussed risks including dependency and liver issues due to chronic liver disease. Agreed to cautious use with pain management  contract.PDMP reviewed.  - Prescribe hydrocodone/acetaminophen 5/325 mg, one tablet at bedtime as needed - Sign pain management contract  Diabetes Mellitus Type 2 Diabetes managed with an insulin pump (Omnipod) delivering approximately 200 units every 1.5 to 2 days. Blood glucose levels generally well-controlled with occasional dietary-related high readings. - Continue current insulin pump regimen - Follow up with endocrinologist as scheduled  Artrial Fibrillation  HR controlled.No signs of bleeding reported  - continue on Eliquis for anticoagulation  - continue to follow up with Cardiologist   Congestive Heart Failure Managed with torsemide, metoprolol, and spironolactone. Improved symptoms and reduced edema since switching to torsemide. Reports more manageable stool consistency. - Continue torsemide 50 mg twice daily - Continue metoprolol and spironolactone as prescribed  Chronic Kidney Disease Chronic kidney disease with nephrology follow-up. Recent labs showed good iron and ferritin levels. - Continue current nephrology follow-up  Prostatitis Currently treated with doxycycline for 42 days. Approximately two weeks into treatment. - Continue doxycycline as prescribed  Restless Leg Syndrome Managed with ropinirole. Reports ongoing symptoms. - Continue ropinirole as prescribed  Hyperlipidemia Managed with rosuvastatin. No reported issues. - Continue rosuvastatin as prescribed  Gastroesophageal Reflux Disease (GERD) Well-controlled with Protonix. - Continue Protonix as prescribed  Hepatic Cirrhosis No signs of fluid overload or ascites.   General Health Maintenance Routine health maintenance including cholesterol and thyroid screening. Scheduled for cataract surgery. Ongoing follow-up for cancer treatment. Discussed upcoming radiation oncology consultation for kidney tumor and potential outcomes including radiation or nephrectomy. - Order cholesterol and thyroid function  tests - Follow up on cataract surgery scheduled for April 1st - Continue follow-up for kidney tumor with radiation oncology on March 11th  Follow-up - Follow up with endocrinologist for diabetes management - Continue nephrology follow-up for chronic kidney disease - Follow up with radiation oncology for kidney tumor on March 11th - Follow up for cataract surgery on April 1st.   Family/ staff Communication: Reviewed plan of care with patient verbalized understanding   Labs/tests ordered: - TSH - Lipid panel  Next Appointment : Return in about 6 months (around 10/14/2023) for fasting labs prior to visit.   Spent 30 minutes of Face to face and non-face to face with patient  >50% time spent counseling; reviewing medical record; tests; labs; documentation and developing future plan of care.   Caesar Bookman, NP

## 2023-04-16 NOTE — Patient Outreach (Signed)
 Care Coordination   04/16/2023 Name: [name] MRN: [id number] DOB: [birth year]   Care Coordination Outreach Attempts:  An unsuccessful outreach was attempted for an appointment today.  Follow Up Plan:  Additional outreach attempts will be made to offer the patient complex care management information and services.   Encounter Outcome:  No Answer   Care Coordination Interventions:  No, not indicated    Delsa Sale RN BSN CCM [place]  [facility], [facility] Nurse Care Coordinator  Direct Dial: [phone] Website: [email or web address]

## 2023-04-17 ENCOUNTER — Encounter: Payer: Self-pay | Admitting: Internal Medicine

## 2023-04-17 ENCOUNTER — Other Ambulatory Visit (HOSPITAL_COMMUNITY)
Admission: RE | Admit: 2023-04-17 | Discharge: 2023-04-17 | Disposition: A | Discharge status: Home / Self Care | Payer: Self-pay | Source: Ambulatory Visit | Attending: Medical Genetics | Admitting: Medical Genetics

## 2023-04-17 DIAGNOSIS — I5022 Chronic systolic (congestive) heart failure: Secondary | ICD-10-CM | POA: Diagnosis not present

## 2023-04-17 DIAGNOSIS — R2681 Unsteadiness on feet: Secondary | ICD-10-CM | POA: Diagnosis not present

## 2023-04-17 DIAGNOSIS — C642 Malignant neoplasm of left kidney, except renal pelvis: Secondary | ICD-10-CM | POA: Diagnosis not present

## 2023-04-17 DIAGNOSIS — G8918 Other acute postprocedural pain: Secondary | ICD-10-CM | POA: Diagnosis not present

## 2023-04-17 DIAGNOSIS — D509 Iron deficiency anemia, unspecified: Secondary | ICD-10-CM | POA: Diagnosis not present

## 2023-04-17 DIAGNOSIS — N1832 Chronic kidney disease, stage 3b: Secondary | ICD-10-CM | POA: Diagnosis not present

## 2023-04-17 DIAGNOSIS — Z4789 Encounter for other orthopedic aftercare: Secondary | ICD-10-CM | POA: Diagnosis not present

## 2023-04-17 DIAGNOSIS — I130 Hypertensive heart and chronic kidney disease with heart failure and stage 1 through stage 4 chronic kidney disease, or unspecified chronic kidney disease: Secondary | ICD-10-CM | POA: Diagnosis not present

## 2023-04-17 DIAGNOSIS — M150 Primary generalized (osteo)arthritis: Secondary | ICD-10-CM | POA: Diagnosis not present

## 2023-04-17 LAB — LIPID PANEL
Cholesterol: 169 mg/dL (ref <200)
HDL: 51 mg/dL (ref >=40)
LDL Cholesterol (Calc): 94 mg/dL
Non-HDL Cholesterol (Calc): 118 mg/dL (ref <130)
Total CHOL/HDL Ratio: 3.3 (calc) (ref <5.0)
Triglycerides: 138 mg/dL (ref <150)

## 2023-04-17 LAB — TSH: TSH: 1.9 m[IU]/L (ref 0.40–4.50)

## 2023-04-19 ENCOUNTER — Encounter: Payer: Self-pay | Admitting: Internal Medicine

## 2023-04-19 ENCOUNTER — Ambulatory Visit: Payer: Self-pay

## 2023-04-19 DIAGNOSIS — E118 Type 2 diabetes mellitus with unspecified complications: Secondary | ICD-10-CM | POA: Diagnosis not present

## 2023-04-19 DIAGNOSIS — E119 Type 2 diabetes mellitus without complications: Secondary | ICD-10-CM | POA: Diagnosis not present

## 2023-04-19 DIAGNOSIS — Z933 Colostomy status: Secondary | ICD-10-CM | POA: Diagnosis not present

## 2023-04-19 DIAGNOSIS — Z794 Long term (current) use of insulin: Secondary | ICD-10-CM | POA: Diagnosis not present

## 2023-04-19 DIAGNOSIS — K56609 Unspecified intestinal obstruction, unspecified as to partial versus complete obstruction: Secondary | ICD-10-CM | POA: Diagnosis not present

## 2023-04-19 NOTE — Patient Instructions (Signed)
 Visit Information  Thank you for taking time to visit with me today. Please don't hesitate to contact me if I can be of assistance to you.   Following are the goals we discussed today:   Goals Addressed             This Visit's Progress    COMPLETED: To maintain and or improve CKD       Care Coordination Interventions: Please see duplicate goal     To undergo left CT-guided cryoablation for renal cell carcinoma   On track    Care Coordination Interventions: Evaluation of current treatment plan related to left renal cell carcinoma and patient's adherence to plan as established by provider Reviewed and discussed with patient he will undergo further evaluation of left kidney carcinoma due to the recent cryoablation left behind what appears to be cancerous tissue Reviewed and discussed patient's upcoming scheduled appointment for initial consultation with Radiation Oncology  [facility]  [address]  [address]  [place], [place] [address]  [phone]  Carolanne Grumbling, MD   Reviewed and discussed patient's most recent GFR per EPIC, patient is currently established with Dr. Ronalee Belts with [facility]  Engage patient in early, proactive and ongoing discussion about goals of care and what matters most to them    Active listening / Reflection utilized  Emotional Support Provided  Discussed plans with patient for ongoing care coordination follow up and provided patient with direct contact information for nurse care coordinator Last practice recorded BP readings:  BP Readings from Last 3 Encounters:  04/16/23 114/70  02/04/23 107/64  01/03/23 132/70   Most recent eGFR/CrCl:  Lab Results  Component Value Date   EGFR 36 04/10/2023    No components found for: "CRCL"         Our next appointment is by telephone on 05/09/23 at 2:00 PM  Please call the care guide team at [phone] if you need to cancel or reschedule your appointment.   If  you are experiencing a Mental Health or Behavioral Health Crisis or need someone to talk to, please call 1-800-273-TALK (toll free, 24 hour hotline)  Patient verbalizes understanding of instructions and care plan provided today and agrees to view in MyChart. Active MyChart status and patient understanding of how to access instructions and care plan via MyChart confirmed with patient.     Delsa Sale RN BSN CCM [place]  [facility], [facility] Health Nurse Care Coordinator  Direct Dial: [phone] Website: [email or web address]

## 2023-04-19 NOTE — Patient Outreach (Signed)
 Care Coordination   Follow Up Visit Note   04/19/2023 Name: Jesse [name] MRN: [id number] DOB: December 10, 1950  Jesse [name] is a 73 y.o. year old male who sees Ngetich, Jesse Citrin, NP for primary care. I spoke with  Jesse [name] by phone today.  What matters to the patients health and wellness today?  Patient would like to consult with Radiation Oncology for evaluation of renal cell carcinoma.     Goals Addressed             This Visit's Progress    COMPLETED: To maintain and or improve CKD       Care Coordination Interventions: Please see duplicate goal     To undergo left CT-guided cryoablation for renal cell carcinoma   On track    Care Coordination Interventions: Evaluation of current treatment plan related to left renal cell carcinoma and patient's adherence to plan as established by provider Reviewed and discussed with patient he will undergo further evaluation of left kidney carcinoma due to the recent cryoablation left behind what appears to be cancerous tissue Reviewed and discussed patient's upcoming scheduled appointment for initial consultation with Radiation Oncology  [facility]  [address]  [address]  [place], [place] [address]  [phone]  Carolanne Grumbling, MD   Reviewed and discussed patient's most recent GFR per EPIC, patient is currently established with Dr. Ronalee Belts with [facility]  Engage patient in early, proactive and ongoing discussion about goals of care and what matters most to them    Active listening / Reflection utilized  Emotional Support Provided  Discussed plans with patient for ongoing care coordination follow up and provided patient with direct contact information for nurse care coordinator Last practice recorded BP readings:  BP Readings from Last 3 Encounters:  04/16/23 114/70  02/04/23 107/64  01/03/23 132/70   Most recent eGFR/CrCl:  Lab Results  Component Value Date   EGFR 36 04/10/2023     No components found for: "CRCL"     Interventions Today    Flowsheet Row Most Recent Value  Chronic Disease   Chronic disease during today's visit Diabetes, Other, Congestive Heart Failure (CHF)  [renal cell carcinoma]  General Interventions   General Interventions Discussed/Reviewed General Interventions Discussed, General Interventions Reviewed, Doctor Visits, Labs  Doctor Visits Discussed/Reviewed Doctor Visits Discussed, Doctor Visits Reviewed, Specialist, PCP  Education Interventions   Education Provided Provided Education  Provided Verbal Education On When to see the doctor, Labs, Medication  Labs Reviewed Kidney Function  Pharmacy Interventions   Pharmacy Dicussed/Reviewed Pharmacy Topics Discussed, Pharmacy Topics Reviewed, Medications and their functions          SDOH assessments and interventions completed:  No     Care Coordination Interventions:  Yes, provided   Follow up plan: Follow up call scheduled for 05/09/23 @2 :00 PM    Encounter Outcome:  Patient Visit Completed

## 2023-04-22 DIAGNOSIS — R2681 Unsteadiness on feet: Secondary | ICD-10-CM | POA: Diagnosis not present

## 2023-04-22 DIAGNOSIS — N1832 Chronic kidney disease, stage 3b: Secondary | ICD-10-CM | POA: Diagnosis not present

## 2023-04-22 DIAGNOSIS — C642 Malignant neoplasm of left kidney, except renal pelvis: Secondary | ICD-10-CM | POA: Diagnosis not present

## 2023-04-22 DIAGNOSIS — Z4789 Encounter for other orthopedic aftercare: Secondary | ICD-10-CM | POA: Diagnosis not present

## 2023-04-22 DIAGNOSIS — I130 Hypertensive heart and chronic kidney disease with heart failure and stage 1 through stage 4 chronic kidney disease, or unspecified chronic kidney disease: Secondary | ICD-10-CM | POA: Diagnosis not present

## 2023-04-22 DIAGNOSIS — M150 Primary generalized (osteo)arthritis: Secondary | ICD-10-CM | POA: Diagnosis not present

## 2023-04-22 DIAGNOSIS — I5022 Chronic systolic (congestive) heart failure: Secondary | ICD-10-CM | POA: Diagnosis not present

## 2023-04-22 DIAGNOSIS — G8918 Other acute postprocedural pain: Secondary | ICD-10-CM | POA: Diagnosis not present

## 2023-04-22 DIAGNOSIS — D509 Iron deficiency anemia, unspecified: Secondary | ICD-10-CM | POA: Diagnosis not present

## 2023-04-22 MED ORDER — ROSUVASTATIN CALCIUM 10 MG PO TABS: 20.0000 mg | ORAL_TABLET | Freq: Every day | ORAL | 2 refills | Status: DC

## 2023-04-22 NOTE — Telephone Encounter (Signed)
 New prescription for the crestor 20 mg has been sent to your [facility] pharmacy.

## 2023-04-22 NOTE — Addendum Note (Signed)
 Addended by: Jeannette How A on: 04/22/2023 03:48 PM   Modules accepted: Orders

## 2023-04-23 NOTE — Addendum Note (Signed)
 Addended byCarolan Clines on: 04/23/2023 03:19 PM   Modules accepted: Level of Service

## 2023-04-24 DIAGNOSIS — R2681 Unsteadiness on feet: Secondary | ICD-10-CM | POA: Diagnosis not present

## 2023-04-24 DIAGNOSIS — D509 Iron deficiency anemia, unspecified: Secondary | ICD-10-CM | POA: Diagnosis not present

## 2023-04-24 DIAGNOSIS — G8918 Other acute postprocedural pain: Secondary | ICD-10-CM | POA: Diagnosis not present

## 2023-04-24 DIAGNOSIS — N1832 Chronic kidney disease, stage 3b: Secondary | ICD-10-CM | POA: Diagnosis not present

## 2023-04-24 DIAGNOSIS — I5022 Chronic systolic (congestive) heart failure: Secondary | ICD-10-CM | POA: Diagnosis not present

## 2023-04-24 DIAGNOSIS — C642 Malignant neoplasm of left kidney, except renal pelvis: Secondary | ICD-10-CM | POA: Diagnosis not present

## 2023-04-24 DIAGNOSIS — I130 Hypertensive heart and chronic kidney disease with heart failure and stage 1 through stage 4 chronic kidney disease, or unspecified chronic kidney disease: Secondary | ICD-10-CM | POA: Diagnosis not present

## 2023-04-24 DIAGNOSIS — Z4789 Encounter for other orthopedic aftercare: Secondary | ICD-10-CM | POA: Diagnosis not present

## 2023-04-24 DIAGNOSIS — M150 Primary generalized (osteo)arthritis: Secondary | ICD-10-CM | POA: Diagnosis not present

## 2023-04-24 NOTE — Progress Notes (Signed)
 Remote pacemaker transmission.

## 2023-04-25 DIAGNOSIS — G4733 Obstructive sleep apnea (adult) (pediatric): Secondary | ICD-10-CM | POA: Diagnosis not present

## 2023-04-26 ENCOUNTER — Ambulatory Visit: Payer: Medicare PPO | Attending: Internal Medicine | Admitting: Internal Medicine

## 2023-04-26 ENCOUNTER — Telehealth: Payer: Self-pay

## 2023-04-26 VITALS — BP 136/77 | Ht 72.0 in | Wt 366.0 lb

## 2023-04-26 DIAGNOSIS — I5032 Chronic diastolic (congestive) heart failure: Secondary | ICD-10-CM

## 2023-04-26 NOTE — Addendum Note (Signed)
 Addended byCarolan Clines on: 04/26/2023 09:55 AM   Modules accepted: Level of Service

## 2023-04-26 NOTE — Telephone Encounter (Signed)
 Rooming phone call

## 2023-04-26 NOTE — Progress Notes (Signed)
 Cardiology Office Note:    Date:  04/26/2023   ID:  Jesse [name], DOB 1950/11/24, MRN [id number]  PCP:  Caesar Bookman, NP   [facility] Providers Cardiologist:  Maisie Fus, MD Electrophysiologist:  Sherryl Manges, MD     Referring MD: Caesar Bookman, NP   No chief complaint on file. HFpEF  History of Present Illness:    Jesse [name] is a 73 y.o. male with a hx of CKD3b, obesity,  bed/wheelchair bound,  hypertension, type 2 diabetes mellitus 5.9 %, sp colectomy/ colostomy, iron deficiency anemia on oral iron, OSA on CPAP, HFpEF  Per her EMR report. He presented with acute on chronic respiratory failure and noted LE edema. His BNP was 218. Hgb 9.1. He was diuresed. He had a large pleural effusion s/p thora yielding 1.2 L.  Fluid is hazy, cloudy and yellow. Cytology was negative. Had a repeat 2L drained. Non infectious.He was diuresed with IV Lasix he is  17 L negative, weight down 38 pounds. His dry weight is ~ 363 pounds. He takes lasix 40 mg BID.  Wt Readings from Last 3 Encounters:  04/16/23 (!) 388 lb (176 kg)  02/04/23 (!) 380 lb (172.4 kg)  01/03/23 (!) 378 lb (171.5 kg)   TC- 94 HDL 39 LDL 38 TSH normal  Crt 2.1 on 05/23/2021 from 1.8-2 , K 4.6  He notes PT looked at his legs prior to the hospital and she instructed him to seek care. This was the first time he was admitted for for decompensated HF. He's been in the hospital. 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  He has a large hernia.He has hx of  CKD3a/b. He was being seen in [place] for fluid retention related to kidney disease. He's been on metolazone and bumex in the past. He self adjusted his diuretics. He does not have scale. He sleeps in a recliner and hoping to trial his bed that's at an incline.  His L>R of his legs He had no DVT. Noted did not want DNR.  He has a living will. His son and wife are designated medical power of attorney.  His BP meds imdur and valsartan were stopped  with soft Bps.   Interim 12/28/2021 He was admitted in September with R facial droop with c/f possible TIA? Did not undergo MRI 2/2 concern for two different lead companies. Echo showed normal fxn. CT head was negative. Neurology evaluated him and recommended DAPT for 3 weeks and then plavix  Noted to have some weight gain; told him to double his lasix, returns for a visit. Blood pressures are well controlled. Saw Dr. Graciela Husbands 10/31 s/p PPM. No changes. He is on 160 mg lasix notes he is losing weight. He is 345 pounds; notes was 407 in March.    Wt Readings from Last 3 Encounters:  04/16/23 (!) 388 lb (176 kg)  02/04/23 (!) 380 lb (172.4 kg)  01/03/23 (!) 378 lb (171.5 kg)    Interim Hx 03/13/2022 He notes having prostate ?infection and dysuria. He lowered his lasix dose to 40 mg BID for a day or so. He also recently was on prednisone. He gained weight 338->348 per patient. He is asymptomatic. His care giver was concerned. He denies angina, dyspnea on exertion, lower extremity pitting edema, PND or orthopnea.   Interim 04/12/2023 Virtual Visit. Notes increased weight gain over the last 6 months. He stopped lasix and was recommended to start torsemide 50 mg BID. He is  also taking metolazone twice per week.  He denies increased O2 needs. He notes some increased SOB with transferring to his chair. He has BL edema.   Interim hx 04/26/2023 Virtual Visit His follow-up labs. Iron saturation is low. He is on oral iron. Renal function has improved from 2 months ago. 2.1 to 1.9. He lost 10 pounds in a few weeks. He feels better on the torsemide a metolazone twice per week.    Cardiology Studies: EKG 05/16/2021: junctional rhythm RBBB morphology WJX:BJYN quality very challenging to fully assess WMA. EF 60-65%, RV moderately enlarged. Svi 40 cc/m2. Dilate LA. Mean MV gradiient 5 mmHg rate 64 bpm noted has mild-mod MS. Mild AS. IVC dialted, with RA pressure of 15 mmHg. Diastolgy indeterminate. 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  CHB with junctional escape rhythm.   PPM DC Medtronic 09/15/2021   Current Medications: Current Outpatient Medications on File Prior to Visit  Medication Sig Dispense Refill   acetaminophen (TYLENOL) 325 MG tablet Take 2 tablets (650 mg total) by mouth every 6 (six) hours.     alfuzosin (UROXATRAL) 10 MG 24 hr tablet TAKE 1 TABLET BY MOUTH EVERY DAY 90 tablet 1   ALPRAZolam (XANAX) 0.5 MG tablet TAKE 1 TABLET(0.5 MG) BY MOUTH TWICE DAILY AS NEEDED FOR ANXIETY OR SLEEP 60 tablet 5   apixaban (ELIQUIS) 5 MG TABS tablet TAKE 1 TABLET(5 MG) BY MOUTH TWICE DAILY 60 tablet 6   Ascorbic Acid (VITAMIN C PO) Take 1 tablet by mouth daily.     Continuous Glucose Sensor (DEXCOM G7 SENSOR) MISC 1 Device by Does not apply route as directed. 9 each 3   CRANBERRY PO Take 1 tablet by mouth daily.     Cyanocobalamin (B-12 PO) Take 1 tablet by mouth daily.     diclofenac Sodium (VOLTAREN) 1 % GEL Apply 2 g topically 4 (four) times daily. Apply to your right neck and shoulder area 3-4 times a day as needed (Patient taking differently: Apply 2 g topically 4 (four) times daily as needed (pain). Apply to your right neck and shoulder area 3-4 times a day as needed) 350 g 1   doxycycline (ADOXA) 100 MG tablet Take 100 mg by mouth 2 (two) times daily.     DULoxetine (CYMBALTA) 60 MG capsule TAKE 1 CAPSULE(60 MG) BY MOUTH DAILY 90 capsule 1   Ferrous Sulfate (IRON PO) Take 1 tablet by mouth daily.     HYDROcodone-acetaminophen (NORCO/VICODIN) 5-325 MG tablet Take 1 tablet by mouth at bedtime as needed for moderate pain (pain score 4-6). 30 tablet 0   insulin aspart (NOVOLOG) 100 UNIT/ML injection Max daily 100 units 100 mL 3   Insulin Disposable Pump (OMNIPOD 5 G7 INTRO, GEN 5,) KIT 1 Device by Does not apply route every other day. 1 kit 0   Insulin Disposable Pump (OMNIPOD 5 G7 PODS, GEN 5,) MISC 1 Device by Does not apply route every  other day. 45 each 3   isosorbide mononitrate (IMDUR) 60 MG 24 hr tablet TAKE 1 TABLET(60 MG) BY MOUTH DAILY 90 tablet 1   ketoconazole (NIZORAL) 2 % cream APPLY TOPICALLY TO THE AFFECTED AREA AS NEEDED FOR IRRITATION 15 g 5   methocarbamol (ROBAXIN) 500 MG tablet TAKE 1 TABLET(500 MG) BY MOUTH EVERY 8 HOURS AS NEEDED FOR MUSCLE SPASMS 90 tablet 5   metolazone (ZAROXOLYN) 2.5 MG tablet Take 1 tablet (2.5 mg total) by mouth daily. Take  1-2 times a week as needed , spacing out 3 days between doses 90 tablet 1   ondansetron (ZOFRAN) 4 MG tablet TAKE 1 TABLET(4 MG) BY MOUTH EVERY 8 HOURS AS NEEDED FOR NAUSEA OR VOMITING 30 tablet 2   OXYGEN Inhale 2 L into the lungs continuous.     pantoprazole (PROTONIX) 40 MG tablet TAKE 1 TABLET(40 MG) BY MOUTH TWICE DAILY BEFORE A MEAL 180 tablet 1   potassium chloride SA (KLOR-CON M) 20 MEQ tablet Take 1 tablet (20 mEq total) by mouth 2 (two) times daily.     Red Yeast Rice 600 MG CAPS Take 1,200 mg by mouth daily.     rOPINIRole (REQUIP) 2 MG tablet TAKE 1 TABLET(2 MG) BY MOUTH AT BEDTIME 90 tablet 3   rosuvastatin (CRESTOR) 10 MG tablet Take 2 tablets (20 mg total) by mouth daily. 90 tablet 2   tirzepatide (MOUNJARO) 5 MG/0.5ML Pen Inject 5 mg into the skin once a week. 6 mL 3   tiZANidine (ZANAFLEX) 2 MG tablet TAKE 1 TABLET BY MOUTH TWICE DAILY (Patient taking differently: Take 2 mg by mouth at bedtime as needed for muscle spasms.) 180 tablet 1   topiramate (TOPAMAX) 25 MG tablet TAKE 1 TABLET BY MOUTH EVERY DAY 90 tablet 2   torsemide (DEMADEX) 20 MG tablet Take 2.5 tablets (50 mg total) by mouth daily.     traMADol (ULTRAM) 50 MG tablet TAKE 1 TABLET(50 MG) BY MOUTH TWICE DAILY AS NEEDED 180 tablet 0   traZODone (DESYREL) 50 MG tablet TAKE 2 TABLETS(100 MG) BY MOUTH AT BEDTIME 60 tablet 5   Vitamin D, Ergocalciferol, (DRISDOL) 1.25 MG (50000 UNIT) CAPS capsule TAKE 1 CAPSULE BY MOUTH EVERY 7 DAYS 5 capsule 1   No current facility-administered medications  on file prior to visit.     Allergies:   Tape      Family History: The patient's family history includes Anxiety disorder in his son; Bipolar disorder in his daughter; Diabetes in his brother.  ROS:   Please see the history of present illness.     All other systems reviewed and are negative.  EKGs/Labs/Other Studies Reviewed:    The following studies were reviewed today:  Recent Labs: 09/27/2022: Magnesium 2.7 10/09/2022: ALT 11 04/10/2023: BUN 34; Creatinine 1.9; Hemoglobin 12.4; Platelets 210; Potassium 4.0; Sodium 136 04/16/2023: TSH 1.90    Recent Lipid Panel    Component Value Date/Time   CHOL 169 04/16/2023 1151   TRIG 138 04/16/2023 1151   HDL 51 04/16/2023 1151   CHOLHDL 3.3 04/16/2023 1151   VLDL 14 11/02/2021 0451   LDLCALC 94 04/16/2023 1151     Risk Assessment/Calculations:           Physical Exam:    VS:   Wt Readings from Last 3 Encounters:  04/16/23 (!) 388 lb (176 kg)  02/04/23 (!) 380 lb (172.4 kg)  01/03/23 (!) 378 lb (171.5 kg)     Well appearing MMM Normal WOB on O2 No abdominal distension Normal Affect     ASSESSMENT:    HFpeF/Morbid Obesity: He is at his baseline today. He's on 2 L at home likely has obesity hypoventilation component for SOB. Pulmonary HTN Group II and III. His RV is mildly enlarged. Will be important to continue his CPAP and O2 to keep his PA pressures down. His  A1c goal < 7%, on SGLT2.  --> lasix 80 mg BID changed to torsemide 50 mg BID a few weeks ago; also metolazone  twice weekly. He remains on O2. Will continue this regimen - continue compression stockings - continue Jardiance 25 mg daily  S/p ?CVA: continue plavix. No hx of afib  Mild Aortic Root Anuerysm: Yearly surveillance. 3.2024  HLD- continue crestor 20 mg daily.   SSS: s/p PPM 09/15/2021, DDD. [employer].  Has medtronic and [employer] leads, both MRI. compatible. follow by Ep. 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 Planned for radiation, the plan is to monitor his renal fxn   PLAN:    In order of problems listed above:   Follow up in 3 months with an APP   Virtual Visit via Video Note  I connected with [name] on 04/26/23 at  9:20 AM EST by a video enabled telemedicine application and verified that I am speaking with the correct person using two identifiers.    I discussed the limitations of evaluation and management by telemedicine and the availability of in person appointments. The patient expressed understanding and agreed to proceed.  I discussed the assessment and treatment plan with the patient. The patient was provided an opportunity to ask questions and all were answered. The patient agreed with the plan and demonstrated an understanding of the instructions.   The patient was advised to call back or seek an in-person evaluation if the symptoms worsen or if the condition fails to improve as anticipated.  I provided 15 minutes of non-face-to-face time during this encounter.   Medication Adjustments/Labs and Tests Ordered: Current medicines are reviewed at length with the patient today.  Concerns regarding medicines are outlined above.  No orders of the defined types were placed in this encounter.  No orders of the defined types were placed in this encounter.   There are no Patient Instructions on file for this visit.   Signed, Maisie Fus, MD  04/26/2023 8:53 AM    [facility]

## 2023-04-26 NOTE — Patient Instructions (Addendum)
 Medication Instructions:  Continue current medications *If you need a refill on your cardiac medications before your next appointment, please call your pharmacy*   Follow-Up: At [facility], you and your health needs are our priority.  As part of our continuing mission to provide you with exceptional heart care, we have created designated Provider Care Teams.  These Care Teams include your primary Cardiologist (physician) and Advanced Practice Providers (APPs -  Physician Assistants and Nurse Practitioners) who all work together to provide you with the care you need, when you need it.   Your next appointment:    07/29/2023 10:30 AM  Provider:   Edd Fabian NP  Other Instructions

## 2023-04-29 ENCOUNTER — Other Ambulatory Visit: Payer: Self-pay | Admitting: Family

## 2023-04-29 DIAGNOSIS — K56609 Unspecified intestinal obstruction, unspecified as to partial versus complete obstruction: Secondary | ICD-10-CM | POA: Diagnosis not present

## 2023-04-29 DIAGNOSIS — Z794 Long term (current) use of insulin: Secondary | ICD-10-CM | POA: Diagnosis not present

## 2023-04-29 DIAGNOSIS — E118 Type 2 diabetes mellitus with unspecified complications: Secondary | ICD-10-CM | POA: Diagnosis not present

## 2023-04-29 DIAGNOSIS — Z933 Colostomy status: Secondary | ICD-10-CM | POA: Diagnosis not present

## 2023-04-29 DIAGNOSIS — E119 Type 2 diabetes mellitus without complications: Secondary | ICD-10-CM | POA: Diagnosis not present

## 2023-04-30 DIAGNOSIS — N1832 Chronic kidney disease, stage 3b: Secondary | ICD-10-CM | POA: Diagnosis not present

## 2023-04-30 DIAGNOSIS — G8918 Other acute postprocedural pain: Secondary | ICD-10-CM | POA: Diagnosis not present

## 2023-04-30 DIAGNOSIS — I130 Hypertensive heart and chronic kidney disease with heart failure and stage 1 through stage 4 chronic kidney disease, or unspecified chronic kidney disease: Secondary | ICD-10-CM | POA: Diagnosis not present

## 2023-04-30 DIAGNOSIS — I5022 Chronic systolic (congestive) heart failure: Secondary | ICD-10-CM | POA: Diagnosis not present

## 2023-04-30 DIAGNOSIS — D509 Iron deficiency anemia, unspecified: Secondary | ICD-10-CM | POA: Diagnosis not present

## 2023-04-30 DIAGNOSIS — R2681 Unsteadiness on feet: Secondary | ICD-10-CM | POA: Diagnosis not present

## 2023-04-30 DIAGNOSIS — M150 Primary generalized (osteo)arthritis: Secondary | ICD-10-CM | POA: Diagnosis not present

## 2023-04-30 DIAGNOSIS — C642 Malignant neoplasm of left kidney, except renal pelvis: Secondary | ICD-10-CM | POA: Diagnosis not present

## 2023-04-30 DIAGNOSIS — Z4789 Encounter for other orthopedic aftercare: Secondary | ICD-10-CM | POA: Diagnosis not present

## 2023-04-30 LAB — GENECONNECT MOLECULAR SCREEN: Genetic Analysis Overall Interpretation: NEGATIVE

## 2023-05-01 DIAGNOSIS — N1832 Chronic kidney disease, stage 3b: Secondary | ICD-10-CM | POA: Diagnosis not present

## 2023-05-01 DIAGNOSIS — G8918 Other acute postprocedural pain: Secondary | ICD-10-CM | POA: Diagnosis not present

## 2023-05-01 DIAGNOSIS — M150 Primary generalized (osteo)arthritis: Secondary | ICD-10-CM | POA: Diagnosis not present

## 2023-05-01 DIAGNOSIS — I5022 Chronic systolic (congestive) heart failure: Secondary | ICD-10-CM | POA: Diagnosis not present

## 2023-05-01 DIAGNOSIS — D509 Iron deficiency anemia, unspecified: Secondary | ICD-10-CM | POA: Diagnosis not present

## 2023-05-01 DIAGNOSIS — I130 Hypertensive heart and chronic kidney disease with heart failure and stage 1 through stage 4 chronic kidney disease, or unspecified chronic kidney disease: Secondary | ICD-10-CM | POA: Diagnosis not present

## 2023-05-01 DIAGNOSIS — C642 Malignant neoplasm of left kidney, except renal pelvis: Secondary | ICD-10-CM | POA: Diagnosis not present

## 2023-05-01 DIAGNOSIS — Z4789 Encounter for other orthopedic aftercare: Secondary | ICD-10-CM | POA: Diagnosis not present

## 2023-05-01 DIAGNOSIS — R2681 Unsteadiness on feet: Secondary | ICD-10-CM | POA: Diagnosis not present

## 2023-05-02 DIAGNOSIS — C642 Malignant neoplasm of left kidney, except renal pelvis: Secondary | ICD-10-CM | POA: Diagnosis not present

## 2023-05-03 ENCOUNTER — Telehealth: Payer: Self-pay | Admitting: Internal Medicine

## 2023-05-03 ENCOUNTER — Encounter: Payer: Self-pay | Admitting: Internal Medicine

## 2023-05-03 ENCOUNTER — Ambulatory Visit (INDEPENDENT_AMBULATORY_CARE_PROVIDER_SITE_OTHER): Payer: Medicare PPO | Admitting: Internal Medicine

## 2023-05-03 DIAGNOSIS — E1165 Type 2 diabetes mellitus with hyperglycemia: Secondary | ICD-10-CM | POA: Diagnosis not present

## 2023-05-03 DIAGNOSIS — Z794 Long term (current) use of insulin: Secondary | ICD-10-CM

## 2023-05-03 LAB — POCT GLYCOSYLATED HEMOGLOBIN (HGB A1C): Hemoglobin A1C: 7.1 % — AB (ref 4.0–5.6)

## 2023-05-03 MED ORDER — TIRZEPATIDE 7.5 MG/0.5ML ~~LOC~~ SOAJ: 7.5000 mg | SUBCUTANEOUS | 3 refills | Status: DC

## 2023-05-03 NOTE — Patient Instructions (Signed)

## 2023-05-03 NOTE — Telephone Encounter (Signed)
 LMTRC  J.Shonna Deiter,RMA

## 2023-05-03 NOTE — Telephone Encounter (Signed)
 Can you please contact Ronaldo Miyamoto from [facility] and ask him to help [place] with this patient's OmniPod download?   The download does not show the pump settings nor his insulin use etc.   thanks

## 2023-05-03 NOTE — Progress Notes (Signed)
 Name: Jesse [name]  MRN/ DOB: [id number], November 04, 1950   Age/ Sex: 73 y.o., male    PCP: Ngetich, [name], NP   Reason for Endocrinology Evaluation: Type 2 Diabetes Mellitus     Date of Initial Endocrinology Visit: 10/01/2022    PATIENT IDENTIFIER: [name] is a 73 y.o. male with a past medical history of DM, OSA, chronic respiratory failure with hypoxia, CKD, CHF, paroxysmal A-fib.  And RCC ( Dx 2024) .The patient presented for initial endocrinology clinic visit on 10/01/2022 for consultative assistance with his diabetes management.    HPI: Jesse [name] was    Diagnosed with DM age 31 Prior Medications tried/Intolerance: Metformin - no intolerant. Glimepiride - hypoglycemia         Hemoglobin A1c has ranged from 5.7% in 2023, peaking at 10.1% in 2024.  Lives in independent living    Patient was hospitalized 09/24/2022 for small bowel obstruction, of note the patient has a history of ostomy placement after ischemic transverse colon resection 2017   Patient with left abdominal hernia    Patient with congenital left foot deformity, requiring metatarsal and heel surgery Patient has noted upper extremity fatigue that is chronic in nature   On his initial visit to our clinic he had an A1c of 10.1%, he was on Jardiance, Victoza, and Lantus, I started him on glipizide, and referred him for insulin pump training  He was started on insulin pump 10/2022  24-hour urinary cortisol was elevated at 60.3 mcg 12/2022 , but his dexamethasone suppression test came back normal at 1.50 through  the home nurse   He was off Jardiance 2024 with recurrent UTI's   SUBJECTIVE:   During the last visit (01/03/2023): A1c 7.5%  Today (05/03/23): Jesse [name] is here for follow-up on diabetes management.  He checks his  blood sugars multiple times daily. The patient has not  had hypoglycemic episodes since the last clinic visit.  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 During the visit the pump basal and bolus doses were reviewed including carb/insulin rations and supplemental doses. The clinical list was updated. The glucose meter download was reviewed in detail to determine if the current pump settings are providing the best glycemic control without excessive hypoglycemia.  Pump and meter download:    Pump   Omnipod  Settings   Insulin type   Novolog    Basal rate       0000 1.4 u/h               I:C ratio       0000 1:1                   Sensitivity       0000  25      Goal       0000  110            Type & Model of Pump: OmniPod Insulin Type: Currently using NovoLog.  There is no height or weight on file to calculate BMI.  PUMP STATISTICS: No data     HOME DIABETES REGIMEN: Mounjaro 5 mg weekly NovoLog   Statin: no ACE-I/ARB: no   CONTINUOUS GLUCOSE MONITORING RECORD INTERPRETATION    Dates of Recording: 3/1-3/14/2025  Sensor description: Dexcom  Results statistics:   CGM use % of time 92.7  Average and SD 208/51  Time in range  35%  % Time Above 180 44  % Time above 250 21  % Time Below target 0   Glycemic patterns summary: BG's trend down overnight and fluctuate throughout the day  Hyperglycemic episodes postprandial  Hypoglycemic episodes occurred N/A  Overnight periods: Optimal   DIABETIC COMPLICATIONS: Microvascular complications:  CKD III, neuropathy, minimal DR  Denies: retinopathy Last eye exam: Completed 05/2022  Macrovascular complications:   Denies: CAD, PVD, CVA   PAST  HISTORY: Past Medical History:  Past Medical History:  Diagnosis Date   Alcohol use disorder in remission 1999   Anemia    iron deficiency   Anxiety and depression    Arthritis    ankles, knees and hands   At high risk for falls    Basal cell carcinoma    x4 (on both shoulders, one on forehead, one on nose)   CKD stage 3b, GFR 30-44 ml/min (HCC)    Former cigarette smoker 1999   Generalized anxiety disorder    Hard of hearing    Headache    History of kidney stones    Hyperlipidemia due to type 2 diabetes mellitus (HCC)    Hypertension    Morbid obesity (HCC)    Neuropathy    O2 dependent    2L   Presence of permanent cardiac pacemaker    Renal cell carcinoma of left kidney (HCC)    pt had cryotherapy for this at [facility] 12/11/22)   Risk for falls    Sleep apnea    uses CPAP   TIA (transient ischemic attack)    pt states this was not confirmed due to MRI unable to be performed   Type 2 diabetes mellitus (HCC)    Ventral hernia with bowel obstruction    Per Hospital Encounter on 05/21/22   Past Surgical History:  Past Surgical History:  Procedure Laterality Date   APPENDECTOMY  1980   BREAST SURGERY Left    "removed part of breast due to gynecomastia"   COLOSTOMY  1989   and reversal same year   COLOSTOMY  2017   FINGER ARTHRODESIS Right 02/04/2023   Procedure: RIGHT INDEX FINGER PROXIMAL INTERPHALANGEAL JOINT ARTHRODESIS;  Surgeon: Mack Hook, MD;  Location: [facility] OR;  Service: Orthopedics;  Laterality: Right;   IR THORACENTESIS ASP PLEURAL SPACE W/IMG GUIDE  05/18/2021   IR THORACENTESIS ASP PLEURAL SPACE W/IMG GUIDE  10/06/2021   KNEE SURGERY Right    NASAL SEPTUM SURGERY  1982   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   Procedure: PACEMAKER IMPLANT;  Surgeon: [facility] Salvia, MD;  Location: [facility] INVASIVE CV LAB;  Service: Cardiovascular;  Laterality: N/A;   RENAL CRYOABLATION  12/11/2022   VARICOCELECTOMY  1983    Social History:  reports that he quit smoking about 26  years ago. His smoking use included cigarettes. He has never used smokeless tobacco. He reports that he does not currently use alcohol. He reports that he does not use drugs. Family History:  Family History  Problem Relation Age of Onset   Diabetes Brother    Bipolar disorder Daughter    Anxiety disorder Son  HOME MEDICATIONS: Allergies as of 05/03/2023       Reactions   Tape Rash   If left on for a long time        Medication List        Accurate as of May 03, 2023 11:49 AM. If you have any questions, ask your nurse or doctor.          STOP taking these medications    Omnipod 5 G7 Intro (Gen 5) Kit   Omnipod 5 G7 Pods (Gen 5) Misc       TAKE these medications    acetaminophen 325 MG tablet Commonly known as: Tylenol Take 2 tablets (650 mg total) by mouth every 6 (six) hours.   alfuzosin 10 MG 24 hr tablet Commonly known as: UROXATRAL TAKE 1 TABLET BY MOUTH EVERY DAY   ALPRAZolam 0.5 MG tablet Commonly known as: XANAX TAKE 1 TABLET(0.5 MG) BY MOUTH TWICE DAILY AS NEEDED FOR ANXIETY OR SLEEP   B-12 PO Take 1 tablet by mouth daily.   CRANBERRY PO Take 1 tablet by mouth daily.   Dexcom G7 Sensor Misc 1 Device by Does not apply route as directed.   diclofenac Sodium 1 % Gel Commonly known as: VOLTAREN Apply 2 g topically 4 (four) times daily. Apply to your right neck and shoulder area 3-4 times a day as needed What changed:  when to take this reasons to take this   doxycycline 100 MG tablet Commonly known as: ADOXA Take 100 mg by mouth 2 (two) times daily.   DULoxetine 60 MG capsule Commonly known as: CYMBALTA TAKE 1 CAPSULE(60 MG) BY MOUTH DAILY   Eliquis 5 MG Tabs tablet Generic drug: apixaban TAKE 1 TABLET(5 MG) BY MOUTH TWICE DAILY   glipiZIDE 5 MG tablet Commonly known as: GLUCOTROL Take 5 mg by mouth daily before breakfast.   HYDROcodone-acetaminophen 5-325 MG tablet Commonly known as: NORCO/VICODIN Take 1 tablet by mouth  at bedtime as needed for moderate pain (pain score 4-6).   insulin aspart 100 UNIT/ML injection Commonly known as: novoLOG Max daily 100 units   IRON PO Take 1 tablet by mouth daily.   isosorbide mononitrate 60 MG 24 hr tablet Commonly known as: IMDUR TAKE 1 TABLET(60 MG) BY MOUTH DAILY   ketoconazole 2 % cream Commonly known as: NIZORAL APPLY TOPICALLY TO THE AFFECTED AREA AS NEEDED FOR IRRITATION   methocarbamol 500 MG tablet Commonly known as: ROBAXIN TAKE 1 TABLET(500 MG) BY MOUTH EVERY 8 HOURS AS NEEDED FOR MUSCLE SPASMS   metolazone 2.5 MG tablet Commonly known as: ZAROXOLYN Take 1 tablet (2.5 mg total) by mouth daily. Take 1-2 times a week as needed , spacing out 3 days between doses   ondansetron 4 MG tablet Commonly known as: ZOFRAN TAKE 1 TABLET(4 MG) BY MOUTH EVERY 8 HOURS AS NEEDED FOR NAUSEA OR VOMITING   OXYGEN Inhale 2 L into the lungs continuous.   pantoprazole 40 MG tablet Commonly known as: PROTONIX TAKE 1 TABLET(40 MG) BY MOUTH TWICE DAILY BEFORE A MEAL   potassium chloride SA 20 MEQ tablet Commonly known as: KLOR-CON M Take 1 tablet (20 mEq total) by mouth 2 (two) times daily.   Red Yeast Rice 600 MG Caps Take 1,200 mg by mouth daily.   rOPINIRole 2 MG tablet Commonly known as: REQUIP TAKE 1 TABLET(2 MG) BY MOUTH AT BEDTIME   rosuvastatin 10 MG tablet Commonly known as: CRESTOR Take 2 tablets (20 mg total) by mouth daily.  tirzepatide 5 MG/0.5ML Pen Commonly known as: MOUNJARO Inject 5 mg into the skin once a week.   tiZANidine 2 MG tablet Commonly known as: ZANAFLEX TAKE 1 TABLET BY MOUTH TWICE DAILY What changed:  when to take this reasons to take this   topiramate 25 MG tablet Commonly known as: TOPAMAX TAKE 1 TABLET BY MOUTH EVERY DAY   torsemide 100 MG tablet Commonly known as: DEMADEX Take 100 mg by mouth daily. What changed: Another medication with the same name was removed. Continue taking this medication, and follow  the directions you see here.   traMADol 50 MG tablet Commonly known as: ULTRAM TAKE 1 TABLET(50 MG) BY MOUTH TWICE DAILY AS NEEDED   traZODone 50 MG tablet Commonly known as: DESYREL TAKE 2 TABLETS(100 MG) BY MOUTH AT BEDTIME   VITAMIN C PO Take 1 tablet by mouth daily.   Vitamin D (Ergocalciferol) 1.25 MG (50000 UNIT) Caps capsule Commonly known as: DRISDOL TAKE 1 CAPSULE BY MOUTH EVERY 7 DAYS         ALLERGIES: Allergies  Allergen Reactions   Tape Rash    If left on for a long time     REVIEW OF SYSTEMS: A comprehensive ROS was conducted with the patient and is negative    OBJECTIVE:   VITAL SIGNS: There were no vitals taken for this visit.   PHYSICAL EXAM:  General: Pt appears well and is in NAD Patient is in a wheelchair  Lungs: Clear with good BS bilat   Heart: RRR   Neuro: MS is good with appropriate affect, pt is alert and Ox3    DM foot exam: 10/01/2022  B/L feet deformity noted  The pedal pulses are 1+ on right and 1+ on left. The sensation is decrease  to a screening 5.07, 10 gram monofilament bilaterally    DATA REVIEWED:  Lab Results  Component Value Date   HGBA1C 7.5 (A) 01/03/2023   HGBA1C 10.1 (H) 09/24/2022   HGBA1C 8.0 12/20/2021    Latest Reference Range & Units 04/10/23 00:00  BASIC METABOLIC PANEL  Rpt ! (E)  COMPREHENSIVE METABOLIC PANEL  Rpt (E)  Sodium 137 - 147  136 ! (E)  Potassium 3.5 - 5.1 mEq/L 4.0 (E)  Chloride 99 - 108  101 (E)  CO2 13 - 22  27 ! (E)  Glucose  190 (E)  BUN 4 - 21  34 ! (E)  Creatinine 0.6 - 1.3  1.9 ! (E)  Calcium 8.7 - 10.7  8.8 (E)  eGFR  36 (E)  Albumin 3.5 - 5.0  4.1 (E)    Latest Reference Range & Units 04/16/23 11:51  Total CHOL/HDL Ratio <5.0 (calc) 3.3  Cholesterol <200 mg/dL 811  HDL Cholesterol > OR = 40 mg/dL 51  LDL Cholesterol (Calc) mg/dL (calc) 94  Non-HDL Cholesterol (Calc) <130 mg/dL (calc) 914  Triglycerides <150 mg/dL 782     ASSESSMENT / PLAN / RECOMMENDATIONS:   1)  Type 2 Diabetes Mellitus, optimally  controlled, With CKD III , neuropathic and retinopathic complications - Most recent A1c of 7.1 %. Goal A1c < 7.0 %.     -A1c continues to trend down without hypoglycemia -We have not been able to download his OmniPod again today -He does not bolus with meals, hence postprandial hyperglycemia -He is not on Jardiance, I suspect due to recurrent and chronic UTI/prostatitis -We had to switch Victoza to [facility] due to manufacturer supply issues, he is tolerating Mounjaro,  will increase [employer]  as below   MEDICATIONS:  Increase Mounjaro 7.5 mg weekly    Pump   Omnipod  Settings   Insulin type   Novolog    Basal rate       0000 1.7 u/h               I:C ratio       0000 1:1     Enter #5 g with each meal              Sensitivity       0000  25      Goal       0000  110         EDUCATION / INSTRUCTIONS: BG monitoring instructions: Patient is instructed to check his blood sugars 3 times a day, before meals. Call [facility] if: BG persistently < 70  I reviewed the Rule of 15 for the treatment of hypoglycemia in detail with the patient. Literature supplied.   2) Diabetic complications:  Eye: Does  have known diabetic retinopathy.  Neuro/ Feet: Does  have known diabetic peripheral neuropathy. Renal: Patient does  have known baseline CKD.        Follow-up in 6 months  I spent 25 minutes preparing to see the patient by review of recent labs, imaging and procedures, obtaining and reviewing separately obtained history, communicating with the patient, ordering medications, tests or procedures, and documenting clinical information in the EHR including the differential Dx, treatment, and any further evaluation and other management   Signed electronically by: Lyndle Herrlich, MD  [facility] Endocr[facility]ology  [facility] [address] [place] [place], [place] [address] Phone: [phone] FAX:  [phone]   CC: Caesar Bookman, NP [address] [place] [address] Phone: [phone]  Fax: [phone]    Return to Endocrinology clinic as below: Future Appointments  Date Time Provider Department Center  05/09/2023  2:00 PM Riley Churches, RN [facility] None  06/14/2023  7:35 AM [facility] DEVICE REMOTES [facility]  07/02/2023 11:00 AM Ngetich, [name], NP [facility] None  07/29/2023 10:30 AM Ronney Asters, NP [facility] None  09/13/2023  7:35 AM [facility] DEVICE REMOTES [facility]  10/15/2023 10:20 AM Ngetich, [name], NP [facility] None  12/13/2023  7:35 AM [facility] DEVICE REMOTES [facility]  03/13/2024  7:35 AM [facility] DEVICE REMOTES [facility]

## 2023-05-06 ENCOUNTER — Encounter: Payer: Self-pay | Admitting: Internal Medicine

## 2023-05-06 NOTE — Telephone Encounter (Signed)
 I have provided Jesse [name] with information patient

## 2023-05-07 ENCOUNTER — Telehealth: Payer: Self-pay | Admitting: *Deleted

## 2023-05-07 NOTE — Progress Notes (Signed)
 Complex Care Management Care Guide Note  05/07/2023 Name: Jesse [name] MRN: [id number] DOB: April 19, 1950  Jesse [name] is a 73 y.o. year old male who is a primary care patient of Ngetich, [name] C, NP and is actively engaged with the care management team. I reached out to [name] by phone today to assist with re-scheduling  with the RN Case Manager.  Follow up plan: Unsuccessful telephone outreach attempt made. Patient request a call back.   Gwenevere Ghazi  [facility]  [facility], [facility] Guide  Direct Dial: [phone]  Fax [phone]

## 2023-05-13 ENCOUNTER — Other Ambulatory Visit: Payer: Self-pay | Admitting: Family

## 2023-05-14 ENCOUNTER — Telehealth: Payer: Self-pay

## 2023-05-14 DIAGNOSIS — E118 Type 2 diabetes mellitus with unspecified complications: Secondary | ICD-10-CM | POA: Diagnosis not present

## 2023-05-14 DIAGNOSIS — Z794 Long term (current) use of insulin: Secondary | ICD-10-CM | POA: Diagnosis not present

## 2023-05-14 DIAGNOSIS — E119 Type 2 diabetes mellitus without complications: Secondary | ICD-10-CM | POA: Diagnosis not present

## 2023-05-14 DIAGNOSIS — Z933 Colostomy status: Secondary | ICD-10-CM | POA: Diagnosis not present

## 2023-05-14 NOTE — Telephone Encounter (Signed)
 Patient overdue for follow up with EP for pacemaker.  Will schedule ASAP appt prior to radiation scheduled to begin 05/20/2023.

## 2023-05-14 NOTE — Telephone Encounter (Signed)
 Apt made 05/15/23 @ 10:00 am with EP APP.

## 2023-05-15 ENCOUNTER — Ambulatory Visit: Attending: Student | Admitting: Student

## 2023-05-15 ENCOUNTER — Encounter: Payer: Self-pay | Admitting: Student

## 2023-05-15 VITALS — BP 122/70 | HR 92 | Ht 72.0 in | Wt 390.0 lb

## 2023-05-15 DIAGNOSIS — R001 Bradycardia, unspecified: Secondary | ICD-10-CM | POA: Diagnosis not present

## 2023-05-15 DIAGNOSIS — I129 Hypertensive chronic kidney disease with stage 1 through stage 4 chronic kidney disease, or unspecified chronic kidney disease: Secondary | ICD-10-CM

## 2023-05-15 DIAGNOSIS — I5032 Chronic diastolic (congestive) heart failure: Secondary | ICD-10-CM

## 2023-05-15 DIAGNOSIS — I4819 Other persistent atrial fibrillation: Secondary | ICD-10-CM

## 2023-05-15 DIAGNOSIS — D6869 Other thrombophilia: Secondary | ICD-10-CM | POA: Diagnosis not present

## 2023-05-15 DIAGNOSIS — N1832 Chronic kidney disease, stage 3b: Secondary | ICD-10-CM | POA: Diagnosis not present

## 2023-05-15 DIAGNOSIS — N183 Chronic kidney disease, stage 3 unspecified: Secondary | ICD-10-CM | POA: Diagnosis not present

## 2023-05-15 DIAGNOSIS — I442 Atrioventricular block, complete: Secondary | ICD-10-CM

## 2023-05-15 LAB — CUP PACEART INCLINIC DEVICE CHECK
Battery Remaining Longevity: 79 mo
Battery Voltage: 2.99 V
Brady Statistic RA Percent Paced: 52 %
Brady Statistic RV Percent Paced: 99.76 %
Date Time Interrogation Session: 20250326120022
Implantable Lead Connection Status: 753985
Implantable Lead Connection Status: 753985
Implantable Lead Implant Date: 20230728
Implantable Lead Implant Date: 20230728
Implantable Lead Location: 753859
Implantable Lead Location: 753860
Implantable Lead Model: 3830
Implantable Pulse Generator Implant Date: 20230728
Lead Channel Impedance Value: 462.5 Ohm
Lead Channel Impedance Value: 550 Ohm
Lead Channel Pacing Threshold Amplitude: 0.75 V
Lead Channel Pacing Threshold Amplitude: 0.75 V
Lead Channel Pacing Threshold Amplitude: 0.75 V
Lead Channel Pacing Threshold Pulse Width: 0.5 ms
Lead Channel Pacing Threshold Pulse Width: 0.5 ms
Lead Channel Pacing Threshold Pulse Width: 0.5 ms
Lead Channel Sensing Intrinsic Amplitude: 12 mV
Lead Channel Sensing Intrinsic Amplitude: 5 mV
Lead Channel Setting Pacing Amplitude: 1.75 V
Lead Channel Setting Pacing Amplitude: 2.5 V
Lead Channel Setting Pacing Pulse Width: 0.5 ms
Lead Channel Setting Sensing Sensitivity: 2.5 mV
Pulse Gen Model: 2272
Pulse Gen Serial Number: 8101667

## 2023-05-15 LAB — BASIC METABOLIC PANEL WITH GFR
BUN: 30 — AB (ref 4–21)
CO2: 23 — AB (ref 13–22)
Chloride: 99 (ref 99–108)
Creatinine: 2.2 — AB (ref 0.6–1.3)
Glucose: 234
Potassium: 4.2 meq/L (ref 3.5–5.1)
Sodium: 141 (ref 137–147)

## 2023-05-15 LAB — IRON,TIBC AND FERRITIN PANEL
%SAT: 14
Ferritin: 30
Iron: 43
TIBC: 314
UIBC: 271

## 2023-05-15 LAB — CBC AND DIFFERENTIAL
HCT: 40 — AB (ref 41–53)
Hemoglobin: 12.7 — AB (ref 13.5–17.5)
Neutrophils Absolute: 4.6
Platelets: 207 10*3/uL (ref 150–400)
WBC: 6.3

## 2023-05-15 LAB — COMPREHENSIVE METABOLIC PANEL WITH GFR
Albumin: 4.4 (ref 3.5–5.0)
Calcium: 9 (ref 8.7–10.7)
eGFR: 31

## 2023-05-15 LAB — CBC: RBC: 4.28 (ref 3.87–5.11)

## 2023-05-15 LAB — VITAMIN D 25 HYDROXY (VIT D DEFICIENCY, FRACTURES): Vit D, 25-Hydroxy: 32.5

## 2023-05-15 NOTE — Progress Notes (Signed)
  Electrophysiology Office Note:   ID:  Kimmy, Parish 04-30-1950, MRN [id number]  Primary Cardiologist: Maisie Fus, MD Electrophysiologist: Sherryl Manges, MD      History of Present Illness:   Jesse [name] is a 73 y.o. male with h/o HFpEF, symptomatic bradycardia s/p PPM, CKDIII, h/o CVA, wheel chair bound, chronic hypoxic respiratory failure, OSA on CPAP, s/p colectomy/colostomy, and PAF seen today for routine electrophysiology followup.   Since last being seen in our clinic the patient reports doing well from a cardiac perspective. He is pending radiation therapy and presents today for device clearance as well as overdue annual follow up. Overall,   he denies chest pain, palpitations, dyspnea, PND, orthopnea, nausea, vomiting, dizziness, syncope, edema, weight gain, or early satiety.   Review of systems complete and found to be negative unless listed in HPI.   EP Information / Studies Reviewed:    EKG is not ordered today. EKG from 12/26/2022 reviewed which showed AV dual paced rhythm       PPM Interrogation-  reviewed in detail today,  See PACEART report.  Arrhythmia/Device History Abbot Dual Chamber PPM 08/2021 for symptomatic brady   Physical Exam:   VS:  BP 122/70   Pulse 92   Ht 6' (1.829 m)   Wt (!) 390 lb (176.9 kg)   SpO2 96%   BMI 52.89 kg/m    Wt Readings from Last 3 Encounters:  05/15/23 (!) 390 lb (176.9 kg)  04/26/23 (!) 366 lb (166 kg)  04/16/23 (!) 388 lb (176 kg)     GEN: No acute distress  NECK: No JVD; No carotid bruits CARDIAC: Regular rate and rhythm, no murmurs, rubs, gallops RESPIRATORY:  Clear to auscultation without rales, wheezing or rhonchi  ABDOMEN: Soft, non-tender, non-distended EXTREMITIES:  No edema; No deformity   ASSESSMENT AND PLAN:    Symptomatic bradycardia s/p Abbott PPM  Normal PPM function See Pace Art report No changes today Not dependent. Radiation form completed  Persistent AF Burden < 2% Continue Eliquis  for CHA2DS2/VASc of at least 6  Secondary hypercoagulable state Pt on Eliquis as above   Obesity Body mass index is 52.89 kg/m.  Encouraged lifestyle modification   OSA  Encouraged nightly CPAP     Disposition:   Follow up with EP APP in 12 months  Signed, Graciella Freer, PA-C

## 2023-05-15 NOTE — Patient Instructions (Signed)
 Medication Instructions:  Your physician recommends that you continue on your current medications as directed. Please refer to the Current Medication list given to you today.  *If you need a refill on your cardiac medications before your next appointment, please call your pharmacy*  Lab Work: None ordered If you have labs (blood work) drawn today and your tests are completely normal, you will receive your results only by: MyChart Message (if you have MyChart) OR A paper copy in the mail If you have any lab test that is abnormal or we need to change your treatment, we will call you to review the results.  Follow-Up: At [facility], you and your health needs are our priority.  As part of our continuing mission to provide you with exceptional heart care, we have created designated Provider Care Teams.  These Care Teams include your primary Cardiologist (physician) and Advanced Practice Providers (APPs -  Physician Assistants and Nurse Practitioners) who all work together to provide you with the care you need, when you need it.  Your next appointment:   1 year(s)  Provider:   Casimiro Needle "Otilio Saber, PA-C

## 2023-05-16 NOTE — Telephone Encounter (Signed)
 [facility] is calling back checking status of form. They need it back asap

## 2023-05-16 NOTE — Telephone Encounter (Signed)
 The form has been completed and faxed to [facility] first thing this am with clearance information.   Thanks.

## 2023-05-17 NOTE — Progress Notes (Signed)
 Complex Care Management Care Guide Note  05/17/2023 Name: [name] MRN: [id number] DOB: July 27, 1950  Jesse [name] is a 73 y.o. year old male who is a primary care patient of Ngetich, [name] C, NP and is actively engaged with the care management team. I reached out to [name] by phone today to assist with re-scheduling  with the RN Case Manager.  Follow up plan: Telephone appointment with complex care management team member scheduled for:  4/24 Gwenevere Ghazi  [facility]  Value-Based Care Institute, [facility] Guide  Direct Dial: [phone]  Fax [phone]

## 2023-05-20 DIAGNOSIS — Z933 Colostomy status: Secondary | ICD-10-CM | POA: Diagnosis not present

## 2023-05-20 DIAGNOSIS — Z794 Long term (current) use of insulin: Secondary | ICD-10-CM | POA: Diagnosis not present

## 2023-05-20 DIAGNOSIS — C642 Malignant neoplasm of left kidney, except renal pelvis: Secondary | ICD-10-CM | POA: Diagnosis not present

## 2023-05-20 DIAGNOSIS — E119 Type 2 diabetes mellitus without complications: Secondary | ICD-10-CM | POA: Diagnosis not present

## 2023-05-20 DIAGNOSIS — E118 Type 2 diabetes mellitus with unspecified complications: Secondary | ICD-10-CM | POA: Diagnosis not present

## 2023-05-20 DIAGNOSIS — K56609 Unspecified intestinal obstruction, unspecified as to partial versus complete obstruction: Secondary | ICD-10-CM | POA: Diagnosis not present

## 2023-05-21 ENCOUNTER — Other Ambulatory Visit: Payer: Self-pay | Admitting: Family

## 2023-05-21 DIAGNOSIS — H25011 Cortical age-related cataract, right eye: Secondary | ICD-10-CM | POA: Diagnosis not present

## 2023-05-21 DIAGNOSIS — H2511 Age-related nuclear cataract, right eye: Secondary | ICD-10-CM | POA: Diagnosis not present

## 2023-05-21 DIAGNOSIS — I4891 Unspecified atrial fibrillation: Secondary | ICD-10-CM | POA: Diagnosis not present

## 2023-05-21 DIAGNOSIS — H25041 Posterior subcapsular polar age-related cataract, right eye: Secondary | ICD-10-CM | POA: Diagnosis not present

## 2023-05-21 DIAGNOSIS — K219 Gastro-esophageal reflux disease without esophagitis: Secondary | ICD-10-CM

## 2023-05-21 DIAGNOSIS — E1136 Type 2 diabetes mellitus with diabetic cataract: Secondary | ICD-10-CM | POA: Diagnosis not present

## 2023-05-21 DIAGNOSIS — G4733 Obstructive sleep apnea (adult) (pediatric): Secondary | ICD-10-CM | POA: Diagnosis not present

## 2023-05-21 DIAGNOSIS — H2512 Age-related nuclear cataract, left eye: Secondary | ICD-10-CM | POA: Diagnosis not present

## 2023-05-23 ENCOUNTER — Other Ambulatory Visit (HOSPITAL_COMMUNITY): Payer: Self-pay | Admitting: Internal Medicine

## 2023-05-23 DIAGNOSIS — I4819 Other persistent atrial fibrillation: Secondary | ICD-10-CM

## 2023-05-23 NOTE — Telephone Encounter (Signed)
 Prescription refill request for Eliquis received. Indication: Afib  Last office visit: 05/15/23 Lanna Poche)  Scr: 1.9 (04/10/23)  Age: 73 Weight: 176.9kg  Appropriate dose. Refill sent.

## 2023-05-26 DIAGNOSIS — G4733 Obstructive sleep apnea (adult) (pediatric): Secondary | ICD-10-CM | POA: Diagnosis not present

## 2023-05-28 DIAGNOSIS — E1122 Type 2 diabetes mellitus with diabetic chronic kidney disease: Secondary | ICD-10-CM | POA: Diagnosis not present

## 2023-05-28 DIAGNOSIS — N184 Chronic kidney disease, stage 4 (severe): Secondary | ICD-10-CM | POA: Diagnosis not present

## 2023-05-28 DIAGNOSIS — E1136 Type 2 diabetes mellitus with diabetic cataract: Secondary | ICD-10-CM | POA: Diagnosis not present

## 2023-05-28 DIAGNOSIS — H2511 Age-related nuclear cataract, right eye: Secondary | ICD-10-CM | POA: Diagnosis not present

## 2023-05-28 DIAGNOSIS — I129 Hypertensive chronic kidney disease with stage 1 through stage 4 chronic kidney disease, or unspecified chronic kidney disease: Secondary | ICD-10-CM | POA: Diagnosis not present

## 2023-05-30 ENCOUNTER — Encounter: Payer: Self-pay | Admitting: Family

## 2023-05-30 DIAGNOSIS — C642 Malignant neoplasm of left kidney, except renal pelvis: Secondary | ICD-10-CM | POA: Diagnosis not present

## 2023-05-30 DIAGNOSIS — Z933 Colostomy status: Secondary | ICD-10-CM | POA: Diagnosis not present

## 2023-05-30 DIAGNOSIS — K56609 Unspecified intestinal obstruction, unspecified as to partial versus complete obstruction: Secondary | ICD-10-CM | POA: Diagnosis not present

## 2023-05-30 DIAGNOSIS — E118 Type 2 diabetes mellitus with unspecified complications: Secondary | ICD-10-CM | POA: Diagnosis not present

## 2023-05-30 DIAGNOSIS — E119 Type 2 diabetes mellitus without complications: Secondary | ICD-10-CM | POA: Diagnosis not present

## 2023-05-30 DIAGNOSIS — Z794 Long term (current) use of insulin: Secondary | ICD-10-CM | POA: Diagnosis not present

## 2023-05-31 DIAGNOSIS — C642 Malignant neoplasm of left kidney, except renal pelvis: Secondary | ICD-10-CM | POA: Diagnosis not present

## 2023-06-03 DIAGNOSIS — C642 Malignant neoplasm of left kidney, except renal pelvis: Secondary | ICD-10-CM | POA: Diagnosis not present

## 2023-06-05 DIAGNOSIS — C642 Malignant neoplasm of left kidney, except renal pelvis: Secondary | ICD-10-CM | POA: Diagnosis not present

## 2023-06-07 DIAGNOSIS — Z0100 Encounter for examination of eyes and vision without abnormal findings: Secondary | ICD-10-CM | POA: Diagnosis not present

## 2023-06-11 DIAGNOSIS — K56609 Unspecified intestinal obstruction, unspecified as to partial versus complete obstruction: Secondary | ICD-10-CM | POA: Diagnosis not present

## 2023-06-11 DIAGNOSIS — E118 Type 2 diabetes mellitus with unspecified complications: Secondary | ICD-10-CM | POA: Diagnosis not present

## 2023-06-11 DIAGNOSIS — E119 Type 2 diabetes mellitus without complications: Secondary | ICD-10-CM | POA: Diagnosis not present

## 2023-06-11 DIAGNOSIS — Z794 Long term (current) use of insulin: Secondary | ICD-10-CM | POA: Diagnosis not present

## 2023-06-11 DIAGNOSIS — Z933 Colostomy status: Secondary | ICD-10-CM | POA: Diagnosis not present

## 2023-06-13 ENCOUNTER — Ambulatory Visit: Payer: Self-pay

## 2023-06-14 ENCOUNTER — Ambulatory Visit (INDEPENDENT_AMBULATORY_CARE_PROVIDER_SITE_OTHER): Payer: Medicare PPO

## 2023-06-14 DIAGNOSIS — I442 Atrioventricular block, complete: Secondary | ICD-10-CM

## 2023-06-14 LAB — CUP PACEART REMOTE DEVICE CHECK
Battery Remaining Longevity: 78 mo
Battery Remaining Percentage: 81 %
Battery Voltage: 2.99 V
Brady Statistic AP VP Percent: 53 %
Brady Statistic AP VS Percent: 1 %
Brady Statistic AS VP Percent: 47 %
Brady Statistic AS VS Percent: 1 %
Brady Statistic RA Percent Paced: 52 %
Brady Statistic RV Percent Paced: 99 %
Date Time Interrogation Session: 20250425033048
Implantable Lead Connection Status: 753985
Implantable Lead Connection Status: 753985
Implantable Lead Implant Date: 20230728
Implantable Lead Implant Date: 20230728
Implantable Lead Location: 753859
Implantable Lead Location: 753860
Implantable Lead Model: 3830
Implantable Pulse Generator Implant Date: 20230728
Lead Channel Impedance Value: 410 Ohm
Lead Channel Impedance Value: 510 Ohm
Lead Channel Pacing Threshold Amplitude: 0.625 V
Lead Channel Pacing Threshold Amplitude: 0.75 V
Lead Channel Pacing Threshold Pulse Width: 0.5 ms
Lead Channel Pacing Threshold Pulse Width: 0.5 ms
Lead Channel Sensing Intrinsic Amplitude: 12 mV
Lead Channel Sensing Intrinsic Amplitude: 3.4 mV
Lead Channel Setting Pacing Amplitude: 1.625
Lead Channel Setting Pacing Amplitude: 2.5 V
Lead Channel Setting Pacing Pulse Width: 0.5 ms
Lead Channel Setting Sensing Sensitivity: 2.5 mV
Pulse Gen Model: 2272
Pulse Gen Serial Number: 8101667

## 2023-06-14 NOTE — Patient Outreach (Signed)
 Complex Care Management   Visit Note  06/13/2023  Name:  Jesse [name] MRN: [id number] DOB: 1951-02-03  Situation: Referral received for Complex Care Management related to Diabetes with Complications, Chronic Kidney Disease, and Prostatitis, Colostomy, malignant neoplasm of left kidney  I obtained verbal consent from Patient.  Visit completed with patient on the phone  Background:   Past Medical History:  Diagnosis Date   Alcohol use disorder in remission 1999   Anemia    iron deficiency   Anxiety and depression    Arthritis    ankles, knees and hands   At high risk for falls    Basal cell carcinoma    x4 (on both shoulders, one on forehead, one on nose)   CKD stage 3b, GFR 30-44 ml/min (HCC)    Former cigarette smoker 1999   Generalized anxiety disorder    Hard of hearing    Headache    History of kidney stones    Hyperlipidemia due to type 2 diabetes mellitus (HCC)    Hypertension    Morbid obesity (HCC)    Neuropathy    O2 dependent    2L   Presence of permanent cardiac pacemaker    Renal cell carcinoma of left kidney (HCC)    pt had cryotherapy for this at [facility] 12/11/22)   Risk for falls    Sleep apnea    uses CPAP   TIA (transient ischemic attack)    pt states this was not confirmed due to MRI unable to be performed   Type 2 diabetes mellitus (HCC)    Ventral hernia with bowel obstruction    Per Hospital Encounter on 05/21/22    Assessment: Patient Reported Symptoms:  Cognitive Cognitive Status: Alert and oriented to person, place, and time Cognitive/Intellectual Conditions Management [RPT]: None reported or documented in medical history or problem list   Health Maintenance Behaviors: Annual physical exam  Neurological Neurological Review of Symptoms: Not assessed    HEENT HEENT Symptoms Reported: Not assessed      Cardiovascular Cardiovascular Symptoms Reported: Not assessed    Respiratory Respiratory Symptoms Reported: Not assesed    Endocrine  Patient reports the following symptoms related to hypoglycemia or hyperglycemia : Not assessed    Gastrointestinal Gastrointestinal Symptoms Reported: No symptoms reported Additional Gastrointestinal Details: Colostomy Gastrointestinal Self-Management Outcome: 4 (good)    Genitourinary Genitourinary Symptoms Reported: Frequency, Urgency Additional Genitourinary Details: Prostatitis Genitourinary Conditions: Difficulty voiding, Kidney cancer Genitourinary Management Strategies: Medication therapy Genitourinary Self-Management Outcome: 3 (uncertain)  Integumentary Integumentary Symptoms Reported: Not assessed    Musculoskeletal Musculoskelatal Symptoms Reviewed: Not assessed        Psychosocial Psychosocial Symptoms Reported: Not assessed     Quality of Family Relationships: supportive      04/16/2023   11:00 AM  Depression screen PHQ 2/9  Decreased Interest 1  Down, Depressed, Hopeless 1  PHQ - 2 Score 2  Altered sleeping 3  Tired, decreased energy 3  Change in appetite 0  Feeling bad or failure about yourself  0  Trouble concentrating 0  Moving slowly or fidgety/restless 0  Suicidal thoughts 0  PHQ-9 Score 8  Difficult doing work/chores Somewhat difficult    There were no vitals filed for this visit.  Medications Reviewed Today   Medications were not reviewed in this encounter     Recommendation:   PCP Follow-up  Follow Up Plan:   Telephone follow up appointment date/time:  Friday, May 23, at 2:00 PM  [facility] Erion Weightman RN  BSN CCM [facility]  [facility], [facility] Nurse Care Coordinator  Direct Dial: [phone] Website: [email or web address]

## 2023-06-14 NOTE — Patient Instructions (Signed)
 Visit Information  Thank you for taking time to visit with me today. Please don't hesitate to contact me if I can be of assistance to you before our next scheduled appointment.  Our next appointment is by telephone on 07/12/23 at 2:00 PM Please call the care guide team at [phone] if you need to cancel or reschedule your appointment.   Following is a copy of your care plan:   Goals Addressed             This Visit's Progress    COMPLETED: To undergo left CT-guided cryoablation for renal cell carcinoma       Care Coordination Interventions: See new goal      VBCI RN Care Plan related to acute Prostatitis       Problems:  Chronic Disease Management support and education needs related to acute Prostatitis  Goal: Over the next 90 days the Patient will demonstrate a decrease acute Prostatitis  in exacerbations as evidenced by patient will establish with a new Urologist for ongoing management of Prostatitis   Interventions:   Evaluation of current treatment plan related to  Prostatitis , self-management and patient's adherence to plan as established by provider 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 with poor effectiveness from previous MD recommendations per [facility] Discussed patient's request to establish with a new Urologist to better manage his symptoms  Sent patient information via patient my chart message regarding [facility] in [facility], Urology group Sent in basket message to PCP, Christean Courts NP requesting a new Urology referral for the above stated provider  Discussed plans with patient for ongoing care management follow up and provided patient with direct contact information for care management team   Patient Self-Care Activities:  Attend all scheduled provider appointments Call pharmacy for medication refills 3-7 days in advance of running out of medications Call provider office for new concerns or questions  Take medications as  prescribed   Work with the social worker to address care coordination needs and will continue to work with the clinical team to address health care and disease management related needs  Plan:  Next PCP appointment scheduled for: 07/02/23 at 11:00 AM Telephone follow up appointment with care management team member scheduled for: Friday May 23, at 2:00 PM           VBCI RN Care Plan related to 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       Problems:  Chronic Disease Management support and education needs related to CKD Stage 3b with malignant neoplasm of left kidney  Goal: Over the next 90 days the Patient will continue to work with Medical illustrator and/or Social Worker to address care management and care coordination needs related to CKD Stage CKD stage 3b with malignant neoplasm of left kidney as evidenced by adherence to care management team scheduled appointments      Interventions:    Chronic Kidney Disease Interventions: Assessed the Patient understanding of chronic kidney disease    Evaluation of current treatment plan related to chronic kidney disease self management and patient's adherence to plan as established by provider      Discussed plans with patient for ongoing care management follow up and provided patient with direct contact information for care management team    Engage patient in early, proactive and ongoing discussion about goals of care and what matters most to them    Last practice recorded BP readings:  BP Readings from  Last 3 Encounters:  05/15/23 122/70  04/26/23 136/77  04/16/23 114/70   Most recent eGFR/CrCl:  Lab Results  Component Value Date   EGFR 31 05/15/2023    No components found for: "CRCL"  Patient Self-Care Activities:  Attend all scheduled provider appointments Call pharmacy for medication refills 3-7 days in advance of running out of medications Call provider office for new concerns or questions  Take  medications as prescribed   Work with the social worker to address care coordination needs and will continue to work with the clinical team to address health care and disease management related needs  Plan:  Next PCP appointment scheduled for: 06/17/23 @10 :20 AM Telephone follow up appointment with care management team member scheduled for: Friday May 23, at 2:00 PM             Please call 1-800-273-TALK (toll free, 24 hour hotline) if you are experiencing a Mental Health or Behavioral Health Crisis or need someone to talk to.  Patient verbalizes understanding of instructions and care plan provided today and agrees to view in MyChart. Active MyChart status and patient understanding of how to access instructions and care plan via MyChart confirmed with patient.     Louanne Roussel RN BSN CCM [place]  [facility], [facility] Health Nurse Care Coordinator  Direct Dial: [phone] Website: [email or web address]

## 2023-06-15 ENCOUNTER — Other Ambulatory Visit: Payer: Self-pay | Admitting: Family

## 2023-06-17 ENCOUNTER — Encounter: Payer: Self-pay | Admitting: Family

## 2023-06-17 ENCOUNTER — Telehealth: Payer: Self-pay | Admitting: Family

## 2023-06-17 ENCOUNTER — Ambulatory Visit: Admitting: Family

## 2023-06-17 VITALS — BP 126/68 | HR 73 | Temp 97.7°F | Resp 20 | Ht 72.0 in | Wt >= 6400 oz

## 2023-06-17 DIAGNOSIS — E871 Hypo-osmolality and hyponatremia: Secondary | ICD-10-CM | POA: Diagnosis not present

## 2023-06-17 DIAGNOSIS — Z923 Personal history of irradiation: Secondary | ICD-10-CM

## 2023-06-17 DIAGNOSIS — R519 Headache, unspecified: Secondary | ICD-10-CM | POA: Diagnosis not present

## 2023-06-17 DIAGNOSIS — R110 Nausea: Secondary | ICD-10-CM | POA: Diagnosis not present

## 2023-06-17 DIAGNOSIS — R109 Unspecified abdominal pain: Secondary | ICD-10-CM

## 2023-06-17 DIAGNOSIS — N184 Chronic kidney disease, stage 4 (severe): Secondary | ICD-10-CM

## 2023-06-17 LAB — POCT URINALYSIS DIPSTICK
Bilirubin, UA: NEGATIVE
Glucose, UA: NEGATIVE
Nitrite, UA: NEGATIVE
Protein, UA: POSITIVE — AB
Spec Grav, UA: 1.02 (ref 1.010–1.025)
Urobilinogen, UA: 0.2 U/dL
pH, UA: 6 (ref 5.0–8.0)

## 2023-06-17 NOTE — Telephone Encounter (Signed)
 Noted.

## 2023-06-17 NOTE — Telephone Encounter (Signed)
 Patient request that when his labs blood and urine results are in please send to [facility].

## 2023-06-17 NOTE — Telephone Encounter (Signed)
 Pharmacy Requested refill.  Pended Rx and sent to [facility] for approval.

## 2023-06-18 NOTE — Telephone Encounter (Signed)
 Lab added to log for lab tech [place].

## 2023-06-18 NOTE — Telephone Encounter (Signed)
Message routed to PCP Ngetich, [name] C, NP  

## 2023-06-19 NOTE — Telephone Encounter (Signed)
 Awaiting final urine culture. FYI

## 2023-06-19 NOTE — Telephone Encounter (Signed)
 Message routed to [place], NP due to PCP Ngetich, Elijio Guadeloupe, NP being out of office

## 2023-06-19 NOTE — Telephone Encounter (Signed)
 Noted.

## 2023-06-20 ENCOUNTER — Telehealth: Payer: Self-pay

## 2023-06-20 ENCOUNTER — Ambulatory Visit: Payer: Self-pay

## 2023-06-20 ENCOUNTER — Other Ambulatory Visit: Payer: Self-pay | Admitting: Nurse Practitioner

## 2023-06-20 LAB — CBC WITH DIFFERENTIAL/PLATELET
Absolute Lymphocytes: 649 {cells}/uL — ABNORMAL LOW (ref 850–3900)
Absolute Monocytes: 781 {cells}/uL (ref 200–950)
Basophils Absolute: 32 {cells}/uL (ref 0–200)
Basophils Relative: 0.5 %
Eosinophils Absolute: 271 {cells}/uL (ref 15–500)
Eosinophils Relative: 4.3 %
HCT: 39.7 % (ref 38.5–50.0)
Hemoglobin: 12.5 g/dL — ABNORMAL LOW (ref 13.2–17.1)
MCH: 29.7 pg (ref 27.0–33.0)
MCHC: 31.5 g/dL — ABNORMAL LOW (ref 32.0–36.0)
MCV: 94.3 fL (ref 80.0–100.0)
MPV: 9.2 fL (ref 7.5–12.5)
Monocytes Relative: 12.4 %
Neutro Abs: 4568 {cells}/uL (ref 1500–7800)
Neutrophils Relative %: 72.5 %
Platelets: 187 10*3/uL (ref 140–400)
RBC: 4.21 10*6/uL (ref 4.20–5.80)
RDW: 14.8 % (ref 11.0–15.0)
Total Lymphocyte: 10.3 %
WBC: 6.3 10*3/uL (ref 3.8–10.8)

## 2023-06-20 LAB — COMPLETE METABOLIC PANEL WITHOUT GFR
AG Ratio: 1.5 (calc) (ref 1.0–2.5)
ALT: 12 U/L (ref 9–46)
AST: 15 U/L (ref 10–35)
Albumin: 4.1 g/dL (ref 3.6–5.1)
Alkaline phosphatase (APISO): 62 U/L (ref 35–144)
BUN/Creatinine Ratio: 13 (calc) (ref 6–22)
BUN: 26 mg/dL — ABNORMAL HIGH (ref 7–25)
CO2: 28 mmol/L (ref 20–32)
Calcium: 8.8 mg/dL (ref 8.6–10.3)
Chloride: 103 mmol/L (ref 98–110)
Creat: 2.08 mg/dL — ABNORMAL HIGH (ref 0.70–1.28)
Globulin: 2.8 g/dL (ref 1.9–3.7)
Glucose, Bld: 199 mg/dL — ABNORMAL HIGH (ref 65–99)
Potassium: 4.4 mmol/L (ref 3.5–5.3)
Sodium: 139 mmol/L (ref 135–146)
Total Bilirubin: 0.4 mg/dL (ref 0.2–1.2)
Total Protein: 6.9 g/dL (ref 6.1–8.1)

## 2023-06-20 LAB — URINE CULTURE
MICRO NUMBER:: 16384114
SPECIMEN QUALITY:: ADEQUATE

## 2023-06-20 LAB — TEST AUTHORIZATION

## 2023-06-20 LAB — BASIC METABOLIC PANEL WITH GFR
BUN/Creatinine Ratio: 13 (calc) (ref 6–22)
BUN: 26 mg/dL — ABNORMAL HIGH (ref 7–25)
CO2: 28 mmol/L (ref 20–32)
Calcium: 8.8 mg/dL (ref 8.6–10.3)
Chloride: 103 mmol/L (ref 98–110)
Creat: 2.08 mg/dL — ABNORMAL HIGH (ref 0.70–1.28)
Glucose, Bld: 199 mg/dL — ABNORMAL HIGH (ref 65–99)
Potassium: 4.4 mmol/L (ref 3.5–5.3)
Sodium: 139 mmol/L (ref 135–146)
eGFR: 33 mL/min/{1.73_m2} — ABNORMAL LOW (ref >=60)

## 2023-06-20 MED ORDER — CIPROFLOXACIN HCL 500 MG PO TABS: 500.0000 mg | ORAL_TABLET | Freq: Two times a day (BID) | ORAL | 0 refills | Status: AC

## 2023-06-20 NOTE — Telephone Encounter (Signed)
 I left a message for the patient to return my call.

## 2023-06-20 NOTE — Telephone Encounter (Signed)
 This has already been done.

## 2023-06-20 NOTE — Progress Notes (Signed)
 Provider: Christean Courts FNP-C  Mandee Pluta, Elijio Guadeloupe, NP  Patient Care Team: Taunia Frasco, Elijio Guadeloupe, NP as PCP - General (Family Medicine) Bridgette Campus, MD as PCP - Cardiology (Cardiology) Verona Goodwill, MD as PCP - Electrophysiology (Cardiology) Rober Chimera, MD as Consulting Physician (Orthopedic Surgery) Nathen Balder Skeeter Dukes, RN as [facility] Care Management  Extended Emergency Contact Information Primary Emergency Contact: Hoshino,[name] Mobile Phone: [phone] Relation: Daughter Preferred language: English Interpreter needed? No Secondary Emergency Contact: [name],[name] Mobile Phone: [phone] Relation: Daughter  Code Status:  Full Code  Goals of care: Advanced Directive information    06/17/2023   10:08 AM  Advanced Directives  Does Patient Have a Medical Advance Directive? Yes  Type of Estate agent of [place];Living will  Does patient want to make changes to medical advance directive? No - Patient declined  Copy of Healthcare Power of Attorney in Chart? Yes - validated most recent copy scanned in chart (See row information)     Chief Complaint  Patient presents with   Referral    Referral to [facility].    Discussed the use of AI scribe software for clinical note transcription with the patient, who gave verbal consent to proceed.  History of Present Illness   Jesse WINTER "MICHAEL" is a 73 year old male who presents with persistent headaches, nausea, and concerns about kidney function.  He completed three rounds of radiation therapy approximately two weeks ago. Since then, he has experienced daily headaches and morning nausea, which he describes as unusual for him. These symptoms are not typical side effects of his recent radiation treatment.  He has noticed a persistent low-grade fever over the weekend, with temperatures ranging from 99.63F to 99.69F, whereas his baseline temperature is usually 97.69F.  He reports  discomfort in the right back, specifically in the kidney area, and discomfort in the prostate region. He has a history of elevated creatinine levels, which previously prevented the use of contrast dye in a test at [facility] due to a creatinine level over 2. He is concerned about impaired kidney function, noting a previous GFR of 31. His urine is darker than usual, although he maintains normal fluid intake and urine output. No blood in urine or stool.  He has a history of kidney stones, but none in the past ten years, and he does not believe his current symptoms are related to kidney stones. He describes a sensation of needing a bowel movement, which he attributes to prostate issues, as he does not have a colon.  His blood pressure has been stable, with recent readings of 126/68 and 120/60. His blood sugar levels have been stable, ranging from 150 to 200, and his last A1c was 7.1, down from 10.4 six months ago. He is on 2 liters of oxygen , with a current oxygen  saturation of 97%.  He is scheduled to see a new urologist, Dr. Kennedy Peabody, in two weeks for further evaluation of his prostate issues.   Past Medical History:  Diagnosis Date   Alcohol use disorder in remission 1999   Anemia    iron deficiency   Anxiety and depression    Arthritis    ankles, knees and hands   At high risk for falls    Basal cell carcinoma    x4 (on both shoulders, one on forehead, one on nose)   CKD stage 3b, GFR 30-44 ml/min (HCC)    Former cigarette smoker 1999   Generalized anxiety disorder  Hard of hearing    Headache    History of kidney stones    Hyperlipidemia due to type 2 diabetes mellitus (HCC)    Hypertension    Morbid obesity (HCC)    Neuropathy    O2 dependent    2L   Presence of permanent cardiac pacemaker    Renal cell carcinoma of left kidney (HCC)    pt had cryotherapy for this at [facility] 12/11/22)   Risk for falls    Sleep apnea    uses CPAP   TIA (transient ischemic attack)    pt states this  was not confirmed due to MRI unable to be performed   Type 2 diabetes mellitus (HCC)    Ventral hernia with bowel obstruction    Per Hospital Encounter on 05/21/22   Past Surgical History:  Procedure Laterality Date   APPENDECTOMY  1980   BREAST SURGERY Left    "removed part of breast due to gynecomastia"   COLOSTOMY  1989   and reversal same year   COLOSTOMY  2017   FINGER ARTHRODESIS Right 02/04/2023   Procedure: RIGHT INDEX FINGER PROXIMAL INTERPHALANGEAL JOINT ARTHRODESIS;  Surgeon: Rober Chimera, MD;  Location: [facility] OR;  Service: Orthopedics;  Laterality: Right;   IR THORACENTESIS ASP PLEURAL SPACE W/IMG GUIDE  05/18/2021   IR THORACENTESIS ASP PLEURAL SPACE W/IMG GUIDE  10/06/2021   KNEE SURGERY Right    NASAL SEPTUM SURGERY  1982   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   Procedure: PACEMAKER IMPLANT;  Surgeon: Verona Goodwill, MD;  Location: [facility] INVASIVE CV LAB;  Service: Cardiovascular;  Laterality: N/A;   RENAL CRYOABLATION  12/11/2022   VARICOCELECTOMY  1983    Allergies  Allergen Reactions   Tape Rash    If left on for a long time    Outpatient Encounter Medications as of 06/17/2023  Medication Sig   acetaminophen  (TYLENOL ) 325 MG tablet Take 2 tablets (650 mg total) by mouth every 6 (six) hours.   alfuzosin  (UROXATRAL ) 10 MG 24 hr tablet TAKE 1 TABLET BY MOUTH EVERY DAY   ALPRAZolam  (XANAX ) 0.5 MG tablet TAKE 1 TABLET(0.5 MG) BY MOUTH TWICE DAILY AS NEEDED FOR ANXIETY OR SLEEP   apixaban  (ELIQUIS ) 5 MG TABS tablet TAKE 1 TABLET(5 MG) BY MOUTH TWICE DAILY   Ascorbic Acid (VITAMIN C  PO) Take 1 tablet by mouth daily.   Continuous Glucose Sensor (DEXCOM G7 SENSOR) MISC 1 Device by Does not apply route as directed.   CRANBERRY PO Take 1 tablet by mouth daily.   Cyanocobalamin  (B-12 PO) Take 1 tablet by mouth daily.   diclofenac  Sodium (VOLTAREN ) 1 % GEL Apply 2 g topically 4 (four) times daily. Apply to your right neck and shoulder area 3-4 times a day as needed (Patient taking  differently: Apply 2 g topically 4 (four) times daily as needed (pain). Apply to your right neck and shoulder area 3-4 times a day as needed)   doxycycline (ADOXA) 100 MG tablet Take 100 mg by mouth 2 (two) times daily.   DULoxetine  (CYMBALTA ) 60 MG capsule TAKE 1 CAPSULE(60 MG) BY MOUTH DAILY   Ferrous Sulfate  (IRON PO) Take 1 tablet by mouth daily.   gatifloxacin (ZYMAXID) 0.5 % SOLN Place 0.5 drops into both eyes.   HYDROcodone -acetaminophen  (NORCO/VICODIN) 5-325 MG tablet Take 1 tablet by mouth at bedtime as needed for moderate pain (pain score 4-6).   insulin  aspart (NOVOLOG ) 100 UNIT/ML injection Max daily 100 units   isosorbide  mononitrate (IMDUR ) 60 MG  24 hr tablet TAKE 1 TABLET(60 MG) BY MOUTH DAILY   ketoconazole  (NIZORAL ) 2 % cream APPLY TOPICALLY TO THE AFFECTED AREA AS NEEDED FOR IRRITATION   ketorolac (ACULAR) 0.5 % ophthalmic solution Place 1 drop into the right eye 2 (two) times daily.   methocarbamol  (ROBAXIN ) 500 MG tablet TAKE 1 TABLET(500 MG) BY MOUTH EVERY 8 HOURS AS NEEDED FOR MUSCLE SPASMS   metolazone  (ZAROXOLYN ) 2.5 MG tablet Take 1 tablet (2.5 mg total) by mouth daily. Take 1-2 times a week as needed , spacing out 3 days between doses   ondansetron  (ZOFRAN ) 4 MG tablet TAKE 1 TABLET(4 MG) BY MOUTH EVERY 8 HOURS AS NEEDED FOR NAUSEA OR VOMITING   OXYGEN  Inhale 2 L into the lungs continuous.   pantoprazole  (PROTONIX ) 40 MG tablet TAKE 1 TABLET(40 MG) BY MOUTH TWICE DAILY BEFORE A MEAL   potassium chloride  SA (KLOR-CON  M) 20 MEQ tablet Take 1 tablet (20 mEq total) by mouth 2 (two) times daily.   prednisoLONE acetate (PRED FORTE) 1 % ophthalmic suspension Place 1 drop into the right eye 3 (three) times daily.   Red Yeast Rice 600 MG CAPS Take 1,200 mg by mouth daily.   rOPINIRole  (REQUIP ) 2 MG tablet TAKE 1 TABLET(2 MG) BY MOUTH AT BEDTIME   rosuvastatin  (CRESTOR ) 10 MG tablet Take 2 tablets (20 mg total) by mouth daily.   tirzepatide (MOUNJARO) 7.5 MG/0.5ML Pen Inject 7.5  mg into the skin once a week.   topiramate  (TOPAMAX ) 25 MG tablet TAKE 1 TABLET BY MOUTH EVERY DAY   torsemide (DEMADEX) 100 MG tablet Take 100 mg by mouth daily.   traMADol  (ULTRAM ) 50 MG tablet TAKE 1 TABLET(50 MG) BY MOUTH TWICE DAILY AS NEEDED   traZODone  (DESYREL ) 50 MG tablet TAKE 2 TABLETS(100 MG) BY MOUTH AT BEDTIME   triamcinolone  cream (KENALOG ) 0.1 % Apply 1 Application topically.   Vitamin D , Ergocalciferol , (DRISDOL ) 1.25 MG (50000 UNIT) CAPS capsule TAKE 1 CAPSULE BY MOUTH EVERY 7 DAYS   [DISCONTINUED] tiZANidine  (ZANAFLEX ) 2 MG tablet TAKE 1 TABLET BY MOUTH TWICE DAILY (Patient taking differently: Take 2 mg by mouth at bedtime as needed for muscle spasms.)   No facility-administered encounter medications on file as of 06/17/2023.    Review of Systems  Constitutional:  Negative for appetite change, chills, fatigue, fever and unexpected weight change.  HENT:  Negative for congestion, ear discharge, ear pain, hearing loss, nosebleeds, postnasal drip, rhinorrhea, sinus pressure, sinus pain, sneezing, sore throat, tinnitus and trouble swallowing.   Eyes:  Negative for pain, discharge, redness, itching and visual disturbance.  Respiratory:  Negative for cough, chest tightness, shortness of breath and wheezing.   Cardiovascular:  Negative for chest pain, palpitations and leg swelling.  Gastrointestinal:  Negative for abdominal distention, abdominal pain, constipation, diarrhea, nausea and vomiting.       Colostomy   Genitourinary:  Negative for difficulty urinating, dysuria, flank pain and urgency.       Chronic frequency due to prostate issues   Musculoskeletal:  Positive for back pain and gait problem. Negative for arthralgias, joint swelling, myalgias, neck pain and neck stiffness.  Skin:  Negative for color change, pallor, rash and wound.  Neurological:  Negative for dizziness, syncope, speech difficulty, weakness, light-headedness, numbness and headaches.  Hematological:  Does  not bruise/bleed easily.  Psychiatric/Behavioral:  Negative for agitation, behavioral problems, confusion, hallucinations and sleep disturbance. The patient is not nervous/anxious.     Immunization History  Administered Date(s) Administered   Fluad  Quad(high Dose 65+) 11/19/2020, 11/02/2021   H1N1 01/24/2008   Influenza, High Dose Seasonal PF 10/28/2022   Moderna Sars-Covid-2 Vaccination 04/03/2019, 05/05/2019, 12/20/2019, 05/23/2020   PNEUMOCOCCAL CONJUGATE-20 06/28/2022   Pfizer Covid-19 Vaccine Bivalent Booster 38yrs & up 12/11/2020   Pfizer(Comirnaty)Fall Seasonal Vaccine 12 years and older 11/19/2021   Pneumococcal Conjugate,unspecified 10/28/2022   Rsv, Bivalent, Protein Subunit Rsvpref,pf Pattricia Bores) 11/19/2021   Tdap 07/06/2022   Unspecified SARS-COV-2 Vaccination 10/28/2022   Zoster Recombinant(Shingrix) 03/01/2020, 05/23/2020   Pertinent  Health Maintenance Due  Topic Date Due   OPHTHALMOLOGY EXAM  06/21/2023   INFLUENZA VACCINE  09/20/2023   FOOT EXAM  10/01/2023   HEMOGLOBIN A1C  11/03/2023   Colonoscopy  Discontinued      02/20/2022    1:51 PM 06/28/2022   11:22 AM 10/09/2022   11:29 AM 02/27/2023    2:18 PM 06/17/2023   10:06 AM  Fall Risk  Falls in the past year? 0 0 0 0 0  Was there an injury with Fall? 0 0 0 0 0  Fall Risk Category Calculator 0 0 0 0 0  Fall Risk Category (Retired) Low      (RETIRED) Patient Fall Risk Level Low fall risk      Patient at Risk for Falls Due to No Fall Risks No Fall Risks No Fall Risks No Fall Risks No Fall Risks  Fall risk Follow up Falls evaluation completed Falls evaluation completed Falls evaluation completed Falls evaluation completed Falls evaluation completed   Functional Status Survey:    Vitals:   06/17/23 1013  BP: 126/68  Pulse: 73  Resp: 20  Temp: 97.7 F (36.5 C)  SpO2: 97%  Weight: (!) 400 lb (181.4 kg)  Height: 6' (1.829 m)   Body mass index is 54.25 kg/m. Physical Exam  VITALS: T- 97.7, P- 73, BP-  126/68, SaO2- 97% GENERAL: Alert, cooperative, well developed, no acute distress HEENT: Normocephalic, normal oropharynx, moist mucous membranes CHEST: Clear to auscultation bilaterally, no wheezes, rhonchi, or crackles CARDIOVASCULAR: Normal heart rate and rhythm, S1 and S2 normal without murmurs ABDOMEN: Soft, non-tender, non-distended, without organomegaly, normal bowel sounds, right kidney area tenderness, no costovertebral angle tenderness.Right lower quadrant colostomy  EXTREMITIES: No cyanosis or edema NEUROLOGICAL: Cranial nerves grossly intact, moves all extremities without gross motor or sensory deficit  SKIN: No rash,no lesion or erythema   PSYCHIATRY/BEHAVIORAL: Mood stable   Labs reviewed: Recent Labs    09/26/22 0452 09/27/22 0606 10/09/22 1200 12/18/22 0000 02/04/23 0948 04/10/23 0000 05/15/23 0000 06/17/23 1100  NA 143 135 138   < > 132* 136* 141 139  139  K 3.6 3.2* 3.9   < > 3.0* 4.0 4.2 4.4  4.4  CL 110 102 102   < > 98 101 99 103  103  CO2 23 23 25    < > 23 27* 23* 28  28  GLUCOSE 216* 185* 163*  --  196*  --   --  199*  199*  BUN 33* 27* 31*   < > 35* 34* 30* 26*  26*  CREATININE 1.81* 1.75* 1.85*   < > 2.19* 1.9* 2.2* 2.08*  2.08*  CALCIUM  8.6* 8.3* 9.1   < > 8.4* 8.8 9.0 8.8  8.8  MG 3.0* 2.7*  --   --   --   --   --   --   PHOS  --  1.8*  --   --   --   --   --   --    < > =  values in this interval not displayed.   Recent Labs    09/24/22 0155 09/27/22 0606 10/09/22 1200 12/18/22 0000 04/10/23 0000 05/15/23 0000 06/17/23 1100  AST 15 15 13   --   --   --  15  ALT 16 12 11   --   --   --  12  ALKPHOS 85 74  --   --   --   --   --   BILITOT 0.6 0.8 0.4  --   --   --  0.4  PROT 6.9 6.8 6.8  --   --   --  6.9  ALBUMIN 3.5 3.3*  --  4.1 4.1 4.4  --    Recent Labs    10/09/22 1200 12/18/22 0000 02/04/23 0948 04/10/23 0000 05/15/23 0000 06/17/23 1100  WBC 7.3   < > 6.7 6.2 6.3 6.3  NEUTROABS 5,570   < >  --  4.00 4.60 4,568  HGB 12.4*    < > 11.9* 12.4* 12.7* 12.5*  HCT 39.9   < > 37.8* 39* 40* 39.7  MCV 87.3  --  88.3  --   --  94.3  PLT 263   < > 223 210 207 187   < > = values in this interval not displayed.   Lab Results  Component Value Date   TSH 1.90 04/16/2023   Lab Results  Component Value Date   HGBA1C 7.1 (A) 05/03/2023   Lab Results  Component Value Date   CHOL 169 04/16/2023   HDL 51 04/16/2023   LDLCALC 94 04/16/2023   TRIG 138 04/16/2023   CHOLHDL 3.3 04/16/2023    Significant Diagnostic Results in last 30 days:  CUP PACEART REMOTE DEVICE CHECK Result Date: 06/14/2023 PPM Scheduled remote reviewed. Normal device function.  Presenting rhythm:  AP/VP 1 AMS EGM, 8sec in duration, PAT Next remote 91 days. LA, CVRS   Assessment/Plan  Chronic kidney disease, stage 4 Stage 3 chronic kidney disease with recent symptoms indicating potential worsening renal function, including nausea, headache, and right back discomfort. Recent creatinine level exceeded 2, and GFR decreased to 31. Urine is darker without hematuria. Concerns about electrolyte imbalance or renal issues post-radiation therapy. He perceives a chemical imbalance affecting kidney function. - Order comprehensive metabolic panel (CMP) to assess kidney function and electrolytes. - Order complete blood count (CBC) to evaluate overall health status. - Collect urine specimen for urinalysis. - continue to follow up with nephrology  Nausea Morning nausea, atypical for him, managed with Zofran  recently. Not a typical side effect of recent radiation therapy, raising concerns about renal or metabolic causes.  Headache Daily headaches for over a week, etiology unclear but possibly related to renal dysfunction or metabolic imbalance.  Prostate discomfort Prostate discomfort with sensation of needing a bowel movement, unusual given colostomy status. Dissatisfaction with current urologist's management. Transitioning care to a new urologist at [facility]  with an appointment in two weeks. No referral needed due to [facility] policy.  Radiation therapy Completed three rounds of radiation therapy two weeks ago. Fatigue expected, but nausea and headache are atypical post-radiation effects.   Family/ staff Communication: Reviewed plan of care with patient verbalized understanding  Labs/tests ordered:  - Urine Culture; Future - POC Urinalysis Dipstick - CBC with Differential/Platelet - CMP with eGFR([facility])  Next Appointment: Return if symptoms worsen or fail to improve.   Total time: 32 minutes. Greater than 50% of total time spent doing patient education regarding nausea,headache,Flank pain,Prostate cancer,CKD stage 4,health  maintenance including symptom/medication management.   Estil Heman, NP

## 2023-06-20 NOTE — Telephone Encounter (Signed)
 Noted.

## 2023-06-20 NOTE — Telephone Encounter (Signed)
 Message routed to Camilo Cella, NP due to PCP Ngetich, Elijio Guadeloupe, NP being out of office.

## 2023-06-20 NOTE — Telephone Encounter (Signed)
 Copied from CRM [id number]. Topic: Clinical - Red Word Triage >> Jun 20, 2023 12:26 PM Blair Bumpers wrote: Red Word that prompted transfer to Nurse Triage: Patient calling in stating that his urine culture came back & he wants to make sure the provider looks at it. States he is in a lot of discomfort and wants to get an antibiotic as soon as possible.   Patient called in stating he saw his urine culture came in and he received a message that Dr. Arlis Lakes is out of the office and wanted to make sure someone was going to review the result and prescribe him an antibiotic for his urinary discomfort. Patient has requested that if possible it be sent to [facility] before 3 pm so it can be delivered. Please contact patient with a response to his request when able.    Reason for Disposition  Caller requesting lab results  (Exception: Routine or non-urgent lab result.)  Answer Assessment - Initial Assessment Questions 1. REASON FOR CALL or QUESTION: "What is your reason for calling today?" or "How can I best help you?" or "What question do you have that I can help answer?"     Urine culture 2. CALLER: Document the source of call. (e.g., laboratory, patient).     Patient  Protocols used: PCP Call - No Triage-A-AH

## 2023-06-20 NOTE — Telephone Encounter (Signed)
-----   Message from Verma Gobble sent at 06/20/2023 12:57 PM EDT ----- electrolytes, glucose, liver and kidney function are stable Blood sugar elevated but reports he was not fasting Continues to be anemic but blood counts stable Will start cipro  500 mg twice daily for 7 days due to results of urine and symptoms  Not to take cipro  with zanaflex - may exacerbate side effects

## 2023-06-25 DIAGNOSIS — G4733 Obstructive sleep apnea (adult) (pediatric): Secondary | ICD-10-CM | POA: Diagnosis not present

## 2023-06-27 DIAGNOSIS — E119 Type 2 diabetes mellitus without complications: Secondary | ICD-10-CM | POA: Diagnosis not present

## 2023-06-27 DIAGNOSIS — K56609 Unspecified intestinal obstruction, unspecified as to partial versus complete obstruction: Secondary | ICD-10-CM | POA: Diagnosis not present

## 2023-06-27 DIAGNOSIS — E118 Type 2 diabetes mellitus with unspecified complications: Secondary | ICD-10-CM | POA: Diagnosis not present

## 2023-06-27 DIAGNOSIS — Z794 Long term (current) use of insulin: Secondary | ICD-10-CM | POA: Diagnosis not present

## 2023-06-27 DIAGNOSIS — Z933 Colostomy status: Secondary | ICD-10-CM | POA: Diagnosis not present

## 2023-07-02 ENCOUNTER — Ambulatory Visit (INDEPENDENT_AMBULATORY_CARE_PROVIDER_SITE_OTHER): Payer: Medicare PPO | Admitting: Family

## 2023-07-02 ENCOUNTER — Encounter: Payer: Self-pay | Admitting: Family

## 2023-07-02 VITALS — BP 124/68 | HR 78 | Temp 98.6°F | Ht 72.0 in | Wt 390.0 lb

## 2023-07-02 DIAGNOSIS — R350 Frequency of micturition: Secondary | ICD-10-CM

## 2023-07-02 DIAGNOSIS — Z0000 Encounter for general adult medical examination without abnormal findings: Secondary | ICD-10-CM | POA: Diagnosis not present

## 2023-07-02 DIAGNOSIS — M25572 Pain in left ankle and joints of left foot: Secondary | ICD-10-CM

## 2023-07-02 DIAGNOSIS — G8929 Other chronic pain: Secondary | ICD-10-CM

## 2023-07-02 MED ORDER — HYDROCODONE-ACETAMINOPHEN 5-325 MG PO TABS: 1.0000 | ORAL_TABLET | Freq: Every evening | ORAL | 0 refills | Status: DC | PRN

## 2023-07-02 NOTE — Patient Instructions (Signed)
 [name] , Thank you for taking time to come for your Medicare Wellness Visit. I appreciate your ongoing commitment to your health goals. Please review the following plan we discussed and let me know if I can assist you in the future.   Screening recommendations/referrals: Colonoscopy : N/A  Recommended yearly ophthalmology/optometry visit for glaucoma screening and checkup Recommended yearly dental visit for hygiene and checkup  Vaccinations: Influenza vaccine due annually in September/October Pneumococcal vaccine : Up date  Tdap vaccine : Up date  Shingles vaccine : Up date     Advanced directives: yes   Conditions/risks identified:  advanced age (>59men, >80 women)  Next appointment: 1 year   Preventive Care 74 Years and Older, Male Preventive care refers to lifestyle choices and visits with your health care provider that can promote health and wellness. What does preventive care include? A yearly physical exam. This is also called an annual well check. Dental exams once or twice a year. Routine eye exams. Ask your health care provider how often you should have your eyes checked. Personal lifestyle choices, including: Daily care of your teeth and gums. Regular physical activity. Eating a healthy diet. Avoiding tobacco and drug use. Limiting alcohol use. Practicing safe sex. Taking low doses of aspirin  every day. Taking vitamin and mineral supplements as recommended by your health care provider. What happens during an annual well check? The services and screenings done by your health care provider during your annual well check will depend on your age, overall health, lifestyle risk factors, and family history of disease. Counseling  Your health care provider may ask you questions about your: Alcohol use. Tobacco use. Drug use. Emotional well-being. Home and relationship well-being. Sexual activity. Eating habits. History of falls. Memory and ability to understand  (cognition). Work and work Astronomer. Screening  You may have the following tests or measurements: Height, weight, and BMI. Blood pressure. Lipid and cholesterol levels. These may be checked every 5 years, or more frequently if you are over 82 years old. Skin check. Lung cancer screening. You may have this screening every year starting at age 20 if you have a 30-pack-year history of smoking and currently smoke or have quit within the past 15 years. Fecal occult blood test (FOBT) of the stool. You may have this test every year starting at age 15. Flexible sigmoidoscopy or colonoscopy. You may have a sigmoidoscopy every 5 years or a colonoscopy every 10 years starting at age 37. Prostate cancer screening. Recommendations will vary depending on your family history and other risks. Hepatitis C blood test. Hepatitis B blood test. Sexually transmitted disease (STD) testing. Diabetes screening. This is done by checking your blood sugar (glucose) after you have not eaten for a while (fasting). You may have this done every 1-3 years. Abdominal aortic aneurysm (AAA) screening. You may need this if you are a current or former smoker. Osteoporosis. You may be screened starting at age 60 if you are at high risk. Talk with your health care provider about your test results, treatment options, and if necessary, the need for more tests. Vaccines  Your health care provider may recommend certain vaccines, such as: Influenza vaccine. This is recommended every year. Tetanus, diphtheria, and acellular pertussis (Tdap, Td) vaccine. You may need a Td booster every 10 years. Zoster vaccine. You may need this after age 68. Pneumococcal 13-valent conjugate (PCV13) vaccine. One dose is recommended after age 37. Pneumococcal polysaccharide (PPSV23) vaccine. One dose is recommended after age 54. Talk to  your health care provider about which screenings and vaccines you need and how often you need them. This  information is not intended to replace advice given to you by your health care provider. Make sure you discuss any questions you have with your health care provider. Document Released: 03/04/2015 Document Revised: 10/26/2015 Document Reviewed: 12/07/2014 Elsevier Interactive Patient Education  2017 [employer].  Fall Prevention in the Home Falls can cause injuries. They can happen to people of all ages. There are many things you can do to make your home safe and to help prevent falls. What can I do on the outside of my home? Regularly fix the edges of walkways and driveways and fix any cracks. Remove anything that might make you trip as you walk through a door, such as a raised step or threshold. Trim any bushes or trees on the path to your home. Use bright outdoor lighting. Clear any walking paths of anything that might make someone trip, such as rocks or tools. Regularly check to see if handrails are loose or broken. Make sure that both sides of any steps have handrails. Any raised decks and porches should have guardrails on the edges. Have any leaves, snow, or ice cleared regularly. Use sand or salt on walking paths during winter. Clean up any spills in your garage right away. This includes oil or grease spills. What can I do in the bathroom? Use night lights. Install grab bars by the toilet and in the tub and shower. Do not use towel bars as grab bars. Use non-skid mats or decals in the tub or shower. If you need to sit down in the shower, use a plastic, non-slip stool. Keep the floor dry. Clean up any water that spills on the floor as soon as it happens. Remove soap buildup in the tub or shower regularly. Attach bath mats securely with double-sided non-slip rug tape. Do not have throw rugs and other things on the floor that can make you trip. What can I do in the bedroom? Use night lights. Make sure that you have a light by your bed that is easy to reach. Do not use any sheets or  blankets that are too big for your bed. They should not hang down onto the floor. Have a firm chair that has side arms. You can use this for support while you get dressed. Do not have throw rugs and other things on the floor that can make you trip. What can I do in the kitchen? Clean up any spills right away. Avoid walking on wet floors. Keep items that you use a lot in easy-to-reach places. If you need to reach something above you, use a strong step stool that has a grab bar. Keep electrical cords out of the way. Do not use floor polish or wax that makes floors slippery. If you must use wax, use non-skid floor wax. Do not have throw rugs and other things on the floor that can make you trip. What can I do with my stairs? Do not leave any items on the stairs. Make sure that there are handrails on both sides of the stairs and use them. Fix handrails that are broken or loose. Make sure that handrails are as long as the stairways. Check any carpeting to make sure that it is firmly attached to the stairs. Fix any carpet that is loose or worn. Avoid having throw rugs at the top or bottom of the stairs. If you do have throw rugs, attach  them to the floor with carpet tape. Make sure that you have a light switch at the top of the stairs and the bottom of the stairs. If you do not have them, ask someone to add them for you. What else can I do to help prevent falls? Wear shoes that: Do not have high heels. Have rubber bottoms. Are comfortable and fit you well. Are closed at the toe. Do not wear sandals. If you use a stepladder: Make sure that it is fully opened. Do not climb a closed stepladder. Make sure that both sides of the stepladder are locked into place. Ask someone to hold it for you, if possible. Clearly mark and make sure that you can see: Any grab bars or handrails. First and last steps. Where the edge of each step is. Use tools that help you move around (mobility aids) if they are  needed. These include: Canes. Walkers. Scooters. Crutches. Turn on the lights when you go into a dark area. Replace any light bulbs as soon as they burn out. Set up your furniture so you have a clear path. Avoid moving your furniture around. If any of your floors are uneven, fix them. If there are any pets around you, be aware of where they are. Review your medicines with your doctor. Some medicines can make you feel dizzy. This can increase your chance of falling. Ask your doctor what other things that you can do to help prevent falls. This information is not intended to replace advice given to you by your health care provider. Make sure you discuss any questions you have with your health care provider. Document Released: 12/02/2008 Document Revised: 07/14/2015 Document Reviewed: 03/12/2014 Elsevier Interactive Patient Education  2017 [employer].

## 2023-07-02 NOTE — Progress Notes (Signed)
 Subjective:   Jesse [name] is a 73 y.o. male who presents for Medicare Annual/Subsequent preventive examination.  Visit Complete: In person  Patient Medicare AWV questionnaire was completed by the patient on 07/02/2023; I have confirmed that all information answered by patient is correct and no changes since this date.  Cardiac Risk Factors include: advanced age (>3men, >35 women)     Objective:     Today's Vitals   07/02/23 1106  BP: 124/68  Pulse: 78  Temp: 98.6 F (37 C)  TempSrc: Temporal  SpO2: 92%  Weight: (!) 390 lb (176.9 kg)  Height: 6' (1.829 m)   Body mass index is 52.89 kg/m.     07/02/2023   11:16 AM 06/17/2023   10:08 AM 04/16/2023   11:02 AM 02/04/2023    9:16 AM 01/31/2023    3:53 PM 09/24/2022    2:00 PM 06/28/2022   11:20 AM  Advanced Directives  Does Patient Have a Medical Advance Directive? Yes Yes Yes Yes Yes Yes Yes  Type of Estate agent of [place];Living will Healthcare Power of [place];Living will Healthcare Power of [place];Living will Healthcare Power of [place];Living will Healthcare Power of [place];Living will Healthcare Power of [place];Living will Out of facility DNR (pink MOST or yellow form)  Does patient want to make changes to medical advance directive? No - Patient declined No - Patient declined No - Patient declined No - Patient declined  No - Patient declined No - Patient declined  Copy of Healthcare Power of Attorney in Chart? Yes - validated most recent copy scanned in chart (See row information) Yes - validated most recent copy scanned in chart (See row information) Yes - validated most recent copy scanned in chart (See row information) No - copy requested  No - copy requested   Pre-existing out of facility DNR order (yellow form or pink MOST form)       Pink MOST form placed in chart (order not valid for inpatient use)    Current Medications (verified) Outpatient Encounter Medications as of 07/02/2023   Medication Sig   acetaminophen  (TYLENOL ) 325 MG tablet Take 2 tablets (650 mg total) by mouth every 6 (six) hours.   alfuzosin  (UROXATRAL ) 10 MG 24 hr tablet TAKE 1 TABLET BY MOUTH EVERY DAY   ALPRAZolam  (XANAX ) 0.5 MG tablet TAKE 1 TABLET(0.5 MG) BY MOUTH TWICE DAILY AS NEEDED FOR ANXIETY OR SLEEP   apixaban  (ELIQUIS ) 5 MG TABS tablet TAKE 1 TABLET(5 MG) BY MOUTH TWICE DAILY   Ascorbic Acid (VITAMIN C  PO) Take 1 tablet by mouth daily.   Continuous Glucose Sensor (DEXCOM G7 SENSOR) MISC 1 Device by Does not apply route as directed.   CRANBERRY PO Take 1 tablet by mouth daily.   Cyanocobalamin  (B-12 PO) Take 1 tablet by mouth daily.   diclofenac  Sodium (VOLTAREN ) 1 % GEL Apply 2 g topically 4 (four) times daily. Apply to your right neck and shoulder area 3-4 times a day as needed (Patient taking differently: Apply 2 g topically 4 (four) times daily as needed (pain). Apply to your right neck and shoulder area 3-4 times a day as needed)   doxycycline (ADOXA) 100 MG tablet Take 100 mg by mouth 2 (two) times daily.   DULoxetine  (CYMBALTA ) 60 MG capsule TAKE 1 CAPSULE(60 MG) BY MOUTH DAILY   Ferrous Sulfate  (IRON PO) Take 1 tablet by mouth daily.   gatifloxacin (ZYMAXID) 0.5 % SOLN Place 0.5 drops into both eyes.   HYDROcodone -acetaminophen  (NORCO/VICODIN) 5-325  MG tablet Take 1 tablet by mouth at bedtime as needed for moderate pain (pain score 4-6).   insulin  aspart (NOVOLOG ) 100 UNIT/ML injection Max daily 100 units   isosorbide  mononitrate (IMDUR ) 60 MG 24 hr tablet TAKE 1 TABLET(60 MG) BY MOUTH DAILY   ketoconazole  (NIZORAL ) 2 % cream APPLY TOPICALLY TO THE AFFECTED AREA AS NEEDED FOR IRRITATION   ketorolac (ACULAR) 0.5 % ophthalmic solution Place 1 drop into the right eye 2 (two) times daily.   methocarbamol  (ROBAXIN ) 500 MG tablet TAKE 1 TABLET(500 MG) BY MOUTH EVERY 8 HOURS AS NEEDED FOR MUSCLE SPASMS   metolazone  (ZAROXOLYN ) 2.5 MG tablet Take 1 tablet (2.5 mg total) by mouth daily. Take 1-2  times a week as needed , spacing out 3 days between doses   ondansetron  (ZOFRAN ) 4 MG tablet TAKE 1 TABLET(4 MG) BY MOUTH EVERY 8 HOURS AS NEEDED FOR NAUSEA OR VOMITING   OXYGEN  Inhale 2 L into the lungs continuous.   pantoprazole  (PROTONIX ) 40 MG tablet TAKE 1 TABLET(40 MG) BY MOUTH TWICE DAILY BEFORE A MEAL   potassium chloride  SA (KLOR-CON  M) 20 MEQ tablet Take 1 tablet (20 mEq total) by mouth 2 (two) times daily.   prednisoLONE acetate (PRED FORTE) 1 % ophthalmic suspension Place 1 drop into the right eye 3 (three) times daily.   Red Yeast Rice 600 MG CAPS Take 1,200 mg by mouth daily.   rOPINIRole  (REQUIP ) 2 MG tablet TAKE 1 TABLET(2 MG) BY MOUTH AT BEDTIME   rosuvastatin  (CRESTOR ) 10 MG tablet Take 2 tablets (20 mg total) by mouth daily.   tirzepatide (MOUNJARO) 7.5 MG/0.5ML Pen Inject 7.5 mg into the skin once a week.   tiZANidine  (ZANAFLEX ) 2 MG tablet TAKE 1 TABLET BY MOUTH TWICE DAILY   topiramate  (TOPAMAX ) 25 MG tablet TAKE 1 TABLET BY MOUTH EVERY DAY   torsemide (DEMADEX) 100 MG tablet Take 100 mg by mouth daily.   traMADol  (ULTRAM ) 50 MG tablet TAKE 1 TABLET(50 MG) BY MOUTH TWICE DAILY AS NEEDED   traZODone  (DESYREL ) 50 MG tablet TAKE 2 TABLETS(100 MG) BY MOUTH AT BEDTIME   triamcinolone  cream (KENALOG ) 0.1 % Apply 1 Application topically.   Vitamin D , Ergocalciferol , (DRISDOL ) 1.25 MG (50000 UNIT) CAPS capsule TAKE 1 CAPSULE BY MOUTH EVERY 7 DAYS   No facility-administered encounter medications on file as of 07/02/2023.    Allergies (verified) Tape   History: Past Medical History:  Diagnosis Date   Alcohol use disorder in remission 1999   Anemia    iron deficiency   Anxiety and depression    Arthritis    ankles, knees and hands   At high risk for falls    Basal cell carcinoma    x4 (on both shoulders, one on forehead, one on nose)   CKD stage 3b, GFR 30-44 ml/min (HCC)    Former cigarette smoker 1999   Generalized anxiety disorder    Hard of hearing    Headache     History of kidney stones    Hyperlipidemia due to type 2 diabetes mellitus (HCC)    Hypertension    Morbid obesity (HCC)    Neuropathy    O2 dependent    2L   Presence of permanent cardiac pacemaker    Renal cell carcinoma of left kidney (HCC)    pt had cryotherapy for this at [facility] 12/11/22)   Risk for falls    Sleep apnea    uses CPAP   TIA (transient ischemic attack)  pt states this was not confirmed due to MRI unable to be performed   Type 2 diabetes mellitus (HCC)    Ventral hernia with bowel obstruction    Per Hospital Encounter on 05/21/22   Past Surgical History:  Procedure Laterality Date   APPENDECTOMY  1980   BREAST SURGERY Left    "removed part of breast due to gynecomastia"   COLOSTOMY  1989   and reversal same year   COLOSTOMY  2017   FINGER ARTHRODESIS Right 02/04/2023   Procedure: RIGHT INDEX FINGER PROXIMAL INTERPHALANGEAL JOINT ARTHRODESIS;  Surgeon: Rober Chimera, MD;  Location: [facility] OR;  Service: Orthopedics;  Laterality: Right;   IR THORACENTESIS ASP PLEURAL SPACE W/IMG GUIDE  05/18/2021   IR THORACENTESIS ASP PLEURAL SPACE W/IMG GUIDE  10/06/2021   KNEE SURGERY Right    NASAL SEPTUM SURGERY  1982   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   Procedure: PACEMAKER IMPLANT;  Surgeon: Verona Goodwill, MD;  Location: [facility] INVASIVE CV LAB;  Service: Cardiovascular;  Laterality: N/A;   RENAL CRYOABLATION  12/11/2022   VARICOCELECTOMY  1983   Family History  Problem Relation Age of Onset   Diabetes Brother    Bipolar disorder Daughter    Anxiety disorder Son    Social History   Socioeconomic History   Marital status: Divorced    Spouse name: Not on file   Number of children: 3   Years of education: Not on file   Highest education level: Master's degree (e.g., MA, MS, MEng, MEd, MSW, MBA)  Occupational History   Occupation: Retired  Tobacco Use   Smoking status: Former    Current packs/day: 0.00    Types: Cigarettes    Quit date: 1999    Years since  quitting: 26.3   Smokeless tobacco: Never  Vaping Use   Vaping status: Never Used  Substance and Sexual Activity   Alcohol use: Not Currently    Comment: no alcohol since 1999   Drug use: Never   Sexual activity: Not on file  Other Topics Concern   Not on file  Social History Narrative   Tobacco use, amount per day now: 0   Past tobacco use, amount per day: 1 pack   How many years did you use tobacco: 10 last 1999   Alcohol use (drinks per week): 1/5 day last 1999   Diet:   Do you drink/eat things with caffeine: Yes   Marital status:   Divorced                               What year were you married? 1980   Do you live in a house, apartment, assisted living, condo, trailer, etc.?    Is it one or more stories? 1   How many persons live in your home? 1   Do you have pets in your home?( please list) Cat   Highest Level of education completed? Post Grad   Current or past profession: Engineer, manufacturing.   Do you exercise? Little                                 Type and how often?   Do you have a living will? Yes   Do you have a DNR form?        No  If not, do you want to discuss one?   Do you have signed POA/HPOA forms?   Yes                     If so, please bring to you appointment      Do you have any difficulty bathing or dressing yourself? Yes   Do you have any difficulty preparing food or eating? No   Do you have any difficulty managing your medications? No   Do you have any difficulty managing your finances? No   Do you have any difficulty affording your medications?  No   Social Drivers of Corporate investment banker Strain: Medium Risk (07/02/2023)   Overall Financial Resource Strain (CARDIA)    Difficulty of Paying Living Expenses: Somewhat hard  Food Insecurity: No Food Insecurity (07/02/2023)   Hunger Vital Sign    Worried About Running Out of Food in the Last Year: Never true    Ran Out of Food in the Last Year: Never true  Transportation  Needs: No Transportation Needs (07/02/2023)   PRAPARE - Administrator, Civil Service (Medical): No    Lack of Transportation (Non-Medical): No  Physical Activity: Inactive (07/02/2023)   Exercise Vital Sign    Days of Exercise per Week: 0 days    Minutes of Exercise per Session: 0 min  Stress: Stress Concern Present (07/02/2023)   [employer] of Occupational Health - Occupational Stress Questionnaire    Feeling of Stress : To some extent  Social Connections: Socially Integrated (07/02/2023)   Social Connection and Isolation Panel [NHANES]    Frequency of Communication with Friends and Family: More than three times a week    Frequency of Social Gatherings with Friends and Family: More than three times a week    Attends Religious Services: More than 4 times per year    Active Member of [employer] or Organizations: Yes    Attends Engineer, structural: More than 4 times per year    Marital Status: Married    Tobacco Counseling Counseling given: Not Answered   Clinical Intake:                        Activities of Daily Living    07/02/2023   11:15 AM 02/04/2023    8:47 AM  In your present state of health, do you have any difficulty performing the following activities:  Hearing? 1 0  Vision? 0 0  Difficulty concentrating or making decisions? 0 0  Walking or climbing stairs? 1   Comment uses a prowder chair   Dressing or bathing? 0   Doing errands, shopping? 0   Preparing Food and eating ? N   Using the Toilet? N   In the past six months, have you accidently leaked urine? N   Do you have problems with loss of bowel control? N   Managing your Medications? N   Managing your Finances? N   Housekeeping or managing your Housekeeping? N     Patient Care Team: Jayliana Valencia, Elijio Guadeloupe, NP as PCP - General (Family Medicine) Bridgette Campus, MD as PCP - Cardiology (Cardiology) Verona Goodwill, MD as PCP - Electrophysiology (Cardiology) Rober Chimera, MD  as Consulting Physician (Orthopedic Surgery) Nathen Balder, Skeeter Dukes, RN as VBCI Care Management  Indicate any recent Medical Services you may have received from other than [facility] providers in the past year (date may be approximate).  Assessment:    This is a routine wellness examination for [facility].  Hearing/Vision screen Hearing Screening - Comments:: Denies hearing difficulties   Vision Screening - Comments:: Pt had eye 2 week ago Wears rx glasses - up to date with routine eye exams with  Dr ore    Goals Addressed   None    Depression Screen    07/02/2023   11:10 AM 06/17/2023   10:06 AM 04/16/2023   11:00 AM 10/09/2022   11:29 AM 06/28/2022   11:23 AM  PHQ 2/9 Scores  PHQ - 2 Score 2 2 2  0 0  PHQ- 9 Score 4 7 8  0     Fall Risk    07/02/2023   11:15 AM 06/17/2023   10:06 AM 02/27/2023    2:18 PM 10/09/2022   11:29 AM 06/28/2022   11:22 AM  Fall Risk   Falls in the past year? 0 0 0 0 0  Number falls in past yr: 0 0 0 0 0  Injury with Fall? 0 0 0 0 0  Risk for fall due to : No Fall Risks No Fall Risks No Fall Risks No Fall Risks No Fall Risks  Follow up Falls prevention discussed;Falls evaluation completed Falls evaluation completed Falls evaluation completed Falls evaluation completed Falls evaluation completed    MEDICARE RISK AT HOME:    TIMED UP AND GO:  Was the test performed?  No    Cognitive Function:    06/28/2022   11:27 AM  MMSE - Mini Mental State Exam  Orientation to time 5  Orientation to Place 5  Registration 3  Attention/ Calculation 5  Recall 3  Language- name 2 objects 2  Language- repeat 1  Language- follow 3 step command 3  Language- read & follow direction 1  Write a sentence 1  Copy design 0  Total score 29        Immunizations Immunization History  Administered Date(s) Administered   Fluad Quad(high Dose 65+) 11/19/2020, 11/02/2021   H1N1 01/24/2008   Influenza, High Dose Seasonal PF 10/28/2022   Moderna Sars-Covid-2 Vaccination  04/03/2019, 05/05/2019, 12/20/2019, 05/23/2020   PNEUMOCOCCAL CONJUGATE-20 06/28/2022   Pfizer Covid-19 Vaccine Bivalent Booster 90yrs & up 12/11/2020   Pfizer(Comirnaty)Fall Seasonal Vaccine 12 years and older 11/19/2021   Pneumococcal Conjugate,unspecified 10/28/2022   Rsv, Bivalent, Protein Subunit Rsvpref,pf Pattricia Bores) 11/19/2021   Tdap 07/06/2022   Unspecified SARS-COV-2 Vaccination 10/28/2022   Zoster Recombinant(Shingrix) 03/01/2020, 05/23/2020      Flu Vaccine status: Up to date  Pneumococcal vaccine status: Up to date  Covid-19 vaccine status: Completed vaccines  Qualifies for Shingles Vaccine? Yes   Zostavax completed Yes   Shingrix Completed?: Yes  Screening Tests Health Maintenance  Topic Date Due   COVID-19 Vaccine (8 - Moderna risk 2024-25 season) 04/27/2023   OPHTHALMOLOGY EXAM  06/21/2023   INFLUENZA VACCINE  09/20/2023   FOOT EXAM  10/01/2023   HEMOGLOBIN A1C  11/03/2023   Diabetic kidney evaluation - Urine ACR  01/03/2024   Diabetic kidney evaluation - eGFR measurement  06/16/2024   Medicare Annual Wellness (AWV)  07/01/2024   DTaP/Tdap/Td (2 - Td or Tdap) 07/05/2032   Pneumonia Vaccine 64+ Years old  Completed   Hepatitis C Screening  Completed   Zoster Vaccines- Shingrix  Completed   HPV VACCINES  Aged Out   Meningococcal B Vaccine  Aged Out   Colonoscopy  Discontinued    Health Maintenance  Health Maintenance Due  Topic Date Due  COVID-19 Vaccine (8 - Moderna risk 2024-25 season) 04/27/2023   OPHTHALMOLOGY EXAM  06/21/2023    Colorectal cancer screening: No longer required.   Lung Cancer Screening: (Low Dose CT Chest recommended if Age 30-80 years, 20 pack-year currently smoking OR have quit w/in 15years.) does not qualify.   Lung Cancer Screening Referral: No   Additional Screening:  Hepatitis C Screening: does qualify; Completed Yes  Vision Screening: Recommended annual ophthalmology exams for early detection of glaucoma and other  disorders of the eye. Is the patient up to date with their annual eye exam?  Yes  Who is the provider or what is the name of the office in which the patient attends annual eye exams? [facility]  If pt is not established with a provider, would they like to be referred to a provider to establish care? No .   Dental Screening: Recommended annual dental exams for proper oral hygiene  Diabetic Foot Exam: Diabetic Foot Exam: Completed yes  Community Resource Referral / Chronic Care Management: CRR required this visit?  No   CCM required this visit?  No     Plan:     I have personally reviewed and noted the following in the patient's chart:   Medical and social history Use of alcohol, tobacco or illicit drugs  Current medications and supplements including opioid prescriptions. Patient is currently taking opioid prescriptions. Information provided to patient regarding non-opioid alternatives. Patient advised to discuss non-opioid treatment plan with their provider. Functional ability and status Nutritional status Physical activity Advanced directives List of other physicians Hospitalizations, surgeries, and ER visits in previous 12 months Vitals Screenings to include cognitive, depression, and falls Referrals and appointments  In addition, I have reviewed and discussed with patient certain preventive protocols, quality metrics, and best practice recommendations. A written personalized care plan for preventive services as well as general preventive health recommendations were provided to patient.     Estil Heman, NP   07/02/2023   After Visit Summary: (In Person-Printed) AVS printed and given to the patient  Nurse Notes: Up to date

## 2023-07-04 ENCOUNTER — Ambulatory Visit: Payer: Self-pay | Admitting: Family

## 2023-07-04 LAB — URINE CULTURE
MICRO NUMBER:: 16453811
Result:: NO GROWTH
SPECIMEN QUALITY:: ADEQUATE

## 2023-07-04 LAB — HM DIABETES EYE EXAM

## 2023-07-12 ENCOUNTER — Other Ambulatory Visit: Payer: Self-pay

## 2023-07-12 NOTE — Patient Instructions (Signed)
 Visit Information  Thank you for taking time to visit with me today. Please don't hesitate to contact me if I can be of assistance to you before our next scheduled appointment.  Our next appointment is by telephone on 08/09/23 at 2:15 PM Please call the care guide team at [phone] if you need to cancel or reschedule your appointment.   Following is a copy of your care plan:   Goals Addressed             This Visit's Progress        VBCI RN Care Plan related to acute Prostatitis   On track    Problems:  Chronic Disease Management support and education needs related to acute Prostatitis  Goal: Over the next 90 days the Patient will demonstrate a decrease acute Prostatitis  in exacerbations as evidenced by patient will establish with a new Urologist for ongoing management of Prostatitis   Interventions:   Evaluation of current treatment plan related to Prostatitis, self-management and patient's adherence to plan as established by provider Reviewed and discussed with patient his recent urine culture was negative for infection Discussed and reviewed with patient his new patient appointment scheduled for 07-30-23 at 10:00 AM Encouraged patient to make a list of most pertinent issues to discuss during his visit  Discussed plans with patient for ongoing care management follow up and provided patient with direct contact information for care management team   Patient Self-Care Activities:  Attend all scheduled provider appointments Call pharmacy for medication refills 3-7 days in advance of running out of medications Call provider office for new concerns or questions  Take medications as prescribed   Work with the social worker to address care coordination needs and will continue to work with the clinical team to address health care and disease management related needs  Plan:  Keep your scheduled new patient appointment with Dr. 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 as evidenced by adherence to care management team scheduled appointments      Interventions:    Chronic Kidney Disease Interventions: Assessed the Patient understanding of chronic kidney disease    Evaluation of current treatment plan related to chronic kidney disease self management and patient's adherence to plan as established by provider      Reviewed and discussed with patient his knowledge and understanding of his prescribed treatment plan for management of renal cell carcinoma Discussed patient completed 3 rounds of radiation thus far Reviewed and discussed patient's next scheduled follow up with [facility] on 09/09/23 at 12:40 PM Discussed plans with patient for ongoing nurse care management follow up and provided patient with direct contact information for nurse case management  Last practice recorded BP readings:  BP Readings from Last 3 Encounters:  07/02/23 124/68  06/17/23 126/68  05/15/23 122/70   Most recent eGFR/CrCl:  Lab  Results  Component Value Date   EGFR 33 (L) 06/17/2023    No components found for: "CRCL"   Patient Self-Care Activities:  Attend all scheduled provider appointments Call pharmacy for medication refills 3-7 days in advance of running out of medications Call provider office for new concerns or questions  Take medications as prescribed   Work with the social worker to address  care coordination needs and will continue to work with the clinical team to address health care and disease management related needs  Plan:  Next scheduled follow-up with [facility] is scheduled for: 09/09/23 at 12:40 PM Telephone follow up appointment with care management team member scheduled for: 08/09/23 at 2:15 PM          VBCI RN Care Plan related to Type 2 diabetes mellitus       Problems:  Chronic Disease Management support and education needs related to [facility] Financial Constraints.  Goal: Over the next 90 days the Patient will continue to work with Medical illustrator and/or Social Worker to address care management and care coordination needs related to DMII as evidenced by adherence to care management team scheduled appointments      Interventions:   Diabetes Interventions: Assessed patient's understanding of A1c goal: <7% Reviewed medications with patient and discussed importance of medication adherence Assessed for symptoms related to hypo and hyperglycemia and importance of correct treatment Advised patient, providing education and rationale, to check cbg PCP and record, calling daily before meals and at bedtime  for findings outside established parameters Review of patient status, including review of consultants reports, relevant laboratory and other test results, and medications completed Discussed plans with patient for ongoing nurse care management follow up and provided patient with direct contact information for nurse case management  Lab Results  Component Value Date   HGBA1C 7.1 (A) 05/03/2023    Patient Self-Care Activities:  Attend all scheduled provider appointments Call pharmacy for medication refills 3-7 days in advance of running out of medications Call provider office for new concerns or questions  Take medications as prescribed   Work with the social worker to address care coordination needs and will continue to work with the clinical team to address  health care and disease management related needs check blood sugar at prescribed times: before meals and at bedtime and when you have symptoms of low or high blood sugar check feet daily for cuts, sores or redness take the blood sugar log to all doctor visits drink 6 to 8 glasses of water each day  Plan:  Follow up with provider re: diabetes management as directed  Telephone follow up appointment with care management team member scheduled for:  08/09/23 at 2:15 PM         Please call 1-800-273-TALK (toll free, 24 hour hotline) if you are experiencing a Mental Health or Behavioral Health Crisis or need someone to talk to.  Patient verbalizes understanding of instructions and care plan provided today and agrees to view in MyChart. Active MyChart status and patient understanding of how to access instructions and care plan via MyChart confirmed with patient.     Louanne Roussel RN BSN CCM [place]  [facility], [facility] Nurse Care Coordinator  Direct Dial: [phone] Website: [email or web address]

## 2023-07-12 NOTE — Patient Outreach (Signed)
 Complex Care Management   Visit Note  07/12/2023  Name:  Jesse [name] MRN: [id number] DOB: [birth year]  Situation: Referral received for Complex Care Management related to Diabetes with Complications, Chronic Kidney Disease, Atrial Fibrillation, and Benign Hypertension, Chronic Congestive Heart Failure, Renal Cell Carcinoma, Colostomy, Prostatitis. I obtained verbal consent from Patient.  Visit completed with patient on the phone.  Background:   Past Medical History:  Diagnosis Date   Alcohol use disorder in remission 1999   Anemia    iron deficiency   Anxiety and depression    Arthritis    ankles, knees and hands   At high risk for falls    Basal cell carcinoma    x4 (on both shoulders, one on forehead, one on nose)   CKD stage 3b, GFR 30-44 ml/min (HCC)    Former cigarette smoker 1999   Generalized anxiety disorder    Hard of hearing    Headache    History of kidney stones    Hyperlipidemia due to type 2 diabetes mellitus (HCC)    Hypertension    Morbid obesity (HCC)    Neuropathy    O2 dependent    2L   Presence of permanent cardiac pacemaker    Renal cell carcinoma of left kidney (HCC)    pt had cryotherapy for this at [facility] 12/11/22)   Risk for falls    Sleep apnea    uses CPAP   TIA (transient ischemic attack)    pt states this was not confirmed due to MRI unable to be performed   Type 2 diabetes mellitus (HCC)    Ventral hernia with bowel obstruction    Per Hospital Encounter on 05/21/22    Assessment: Patient Reported Symptoms:  Cognitive Cognitive Status: Alert and oriented to person, place, and time Cognitive/Intellectual Conditions Management [RPT]: None reported or documented in medical history or problem list   Health Maintenance Behaviors: Annual physical exam, Immunizations, Social activities, Stress management Healing Pattern: Fast Health Facilitated by: Healthy diet, Pain control, Stress management  Neurological Neurological Review of Symptoms:  Headaches Neurological Management Strategies: Medication therapy, Routine screening Neurological Self-Management Outcome: 4 (good)  HEENT HEENT Symptoms Reported: No symptoms reported      Cardiovascular Cardiovascular Symptoms Reported: Swelling in legs or feet Does patient have uncontrolled Hypertension?: Yes Is patient checking Blood Pressure at home?: No Cardiovascular Conditions: Hypertension, Heart failure, Dysrhythmia, High blood cholesterol (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) Cardiovascular Management Strategies: Medication therapy, Routine screening, Diet modification Cardiovascular Self-Management Outcome: 4 (good)  Respiratory Respiratory Symptoms Reported: No symptoms reported Respiratory Conditions: Sleep disordered breathing Respiratory Self-Management Outcome: 4 (good)  Endocrine Patient reports the following symptoms related to hypoglycemia or hyperglycemia : No symptoms reported Is patient diabetic?: Yes Is patient checking blood sugars at home?: Yes Endocrine Conditions: Diabetes Endocrine Management Strategies: Medication therapy, Routine screening, Diet modification Endocrine Self-Management Outcome: 4 (good)  Gastrointestinal Gastrointestinal Symptoms Reported: Obesity Gastrointestinal Conditions: Other Other Gastrointestinal Conditions: history of bowel obstruction; Colostomy Gastrointestinal Management Strategies: Colostomy Gastrointestinal Self-Management Outcome: 4 (good) Nutrition Risk Screen (CP): No indicators present  Genitourinary Genitourinary Symptoms Reported: Frequency, Pain with urination Genitourinary Conditions: Other, Chronic kidney disease Other Genitourinary Conditions: Prostatitis; Renal cell adenocarcinoma Genitourinary Self-Management Outcome: 3 (uncertain) Genitourinary Comment: s/p radiation, s/p renal cell cryoablation  Integumentary Integumentary Symptoms Reported: No symptoms reported    Musculoskeletal  Musculoskelatal Symptoms Reviewed: Difficulty walking, Unsteady gait Additional Musculoskeletal Details: chronic knee pain, foot and hand pain Musculoskeletal Conditions:  Mobility limited, Osteoarthritis, Unsteady gait Musculoskeletal Management Strategies: Medical device, Routine screening, Medication therapy, Adequate rest Musculoskeletal Self-Management Outcome: 4 (good) Falls in the past year?: No Number of falls in past year: 1 or less Was there an injury with Fall?: No Fall Risk Category Calculator: 0 Patient Fall Risk Level: Low Fall Risk Patient at Risk for Falls Due to: No Fall Risks Fall risk Follow up: Falls evaluation completed  Psychosocial Psychosocial Symptoms Reported: Anxiety - if selected complete GAD, Depression - if selected complete PHQ 2-9 Behavioral Health Conditions: Anxiety, Depression Behavioral Management Strategies: Support system, Coping strategies Behavioral Health Self-Management Outcome: 4 (good) Major Change/Loss/Stressor/Fears (CP): Medical condition, self Behaviors When Feeling Stressed/Fearful: political concerns Techniques to Cope with Loss/Stress/Change: Spiritual practice(s), Diversional activities Quality of Family Relationships: involved, helpful, supportive Do you feel physically threatened by others?: No      07/12/2023    3:06 PM  Depression screen PHQ 2/9  Decreased Interest 0  Down, Depressed, Hopeless 2  PHQ - 2 Score 2  Altered sleeping 1  Tired, decreased energy 1  Change in appetite 0  Feeling bad or failure about yourself  0  Trouble concentrating 0  Moving slowly or fidgety/restless 0  Suicidal thoughts 0  PHQ-9 Score 4  Difficult doing work/chores Not difficult at all    There were no vitals filed for this visit.  Medications Reviewed Today     Reviewed by Kaylene Pascal, RN (Registered Nurse) on 07/12/23 at 1453  Med List Status: <None>   Medication Order Taking? Sig Documenting Provider Last Dose Status Informant   acetaminophen  (TYLENOL ) 325 MG tablet [id number] Yes Take 2 tablets (650 mg total) by mouth every 6 (six) hours. Rober Chimera, MD Taking Active   alfuzosin  (UROXATRAL ) 10 MG 24 hr tablet [id number] Yes TAKE 1 TABLET BY MOUTH EVERY DAY Ngetich, [name] C, NP Taking Active   ALPRAZolam  (XANAX ) 0.5 MG tablet [id number] Yes TAKE 1 TABLET(0.5 MG) BY MOUTH TWICE DAILY AS NEEDED FOR ANXIETY OR SLEEP Ngetich, [name] C, NP Taking Active   apixaban  (ELIQUIS ) 5 MG TABS tablet [id number] Yes TAKE 1 TABLET(5 MG) BY MOUTH TWICE DAILY Verona Goodwill, MD Taking Active   Ascorbic Acid (VITAMIN C  PO) [id number] No Take 1 tablet by mouth daily.  Patient not taking: Reported on 07/12/2023   [provider] Not Taking Active Self  Cholecalciferol (VITAMIN D3 SUPER STRENGTH) 50 MCG (2000 UT) TABS [id number] Yes Take 2,000 Units by mouth daily. [provider] Taking Active   Continuous Glucose Sensor (DEXCOM G7 SENSOR) MISC [id number]  1 Device by Does not apply route as directed. Shamleffer, Julian Obey, MD  Active Self  CRANBERRY PO [id number] No Take 1 tablet by mouth daily.  Patient not taking: Reported on 07/12/2023   [provider] Not Taking Active Self  Cyanocobalamin  (B-12 PO) [id number] Yes Take 1 tablet by mouth daily. [provider] Taking Active Self  diclofenac  Sodium (VOLTAREN ) 1 % GEL [id number] Yes Apply 2 g topically 4 (four) times daily. Apply to your right neck and shoulder area 3-4 times a day as needed  Patient taking differently: Apply 2 g topically 4 (four) times daily as needed (pain). Apply to your right neck and shoulder area 3-4 times a day as needed   Ngetich, [name] C, NP Taking Active Self  DULoxetine  (CYMBALTA ) 60 MG capsule [id number] Yes TAKE 1 CAPSULE(60 MG) BY MOUTH DAILY Ngetich, [name] C, NP Taking Active   Ferrous Sulfate  (IRON PO) [id number] Yes  Take 2 tablets by mouth daily. [provider] Taking Active Self  gatifloxacin (ZYMAXID) 0.5 %  SOLN [id number]  Place 0.5 drops into both eyes. [provider]  Consider Medication Status and Discontinue (Completed Course)   HYDROcodone -acetaminophen  (NORCO/VICODIN) 5-325 MG tablet [id number] Yes Take 1 tablet by mouth at bedtime as needed for moderate pain (pain score 4-6). Ngetich, [name] C, NP Taking Active   insulin  aspart (NOVOLOG ) 100 UNIT/ML injection [id number] Yes Max daily 100 units Shamleffer, Ibtehal Jaralla, MD Taking Active Self           Med Note Dennice Fishman A   Wed Jan 30, 2023  1:33 PM) Uses in insulin  pump  isosorbide  mononitrate (IMDUR ) 60 MG 24 hr tablet [id number] Yes TAKE 1 TABLET(60 MG) BY MOUTH DAILY Ngetich, [name] C, NP Taking Active Self  ketoconazole  (NIZORAL ) 2 % cream [id number] Yes APPLY TOPICALLY TO THE AFFECTED AREA AS NEEDED FOR IRRITATION Ngetich, [name] C, NP Taking Active Self  ketorolac (ACULAR) 0.5 % ophthalmic solution [id number]  Place 1 drop into the right eye 2 (two) times daily. [provider]  Consider Medication Status and Discontinue (Completed Course)   methocarbamol  (ROBAXIN ) 500 MG tablet [id number] Yes TAKE 1 TABLET(500 MG) BY MOUTH EVERY 8 HOURS AS NEEDED FOR MUSCLE SPASMS Ngetich, [name] C, NP Taking Active   metolazone  (ZAROXOLYN ) 2.5 MG tablet [id number] Yes Take 1 tablet (2.5 mg total) by mouth daily. Take 1-2 times a week as needed , spacing out 3 days between doses Ngetich, [name] C, NP Taking Active Self  ondansetron  (ZOFRAN ) 4 MG tablet [id number] Yes TAKE 1 TABLET(4 MG) BY MOUTH EVERY 8 HOURS AS NEEDED FOR NAUSEA OR VOMITING Ngetich, Elijio Guadeloupe, NP Taking Active Self  OXYGEN  [id number] Yes Inhale 2 L into the lungs continuous. [provider] Taking Active Self  pantoprazole  (PROTONIX ) 40 MG tablet [id number] Yes TAKE 1 TABLET(40 MG) BY MOUTH TWICE DAILY BEFORE A MEAL Ngetich, [name] C, NP Taking Active   potassium chloride  SA (KLOR-CON  M) 20 MEQ tablet [id number] Yes Take 1 tablet (20 mEq total) by mouth 2 (two) times daily.  Singh, Prashant K, MD Taking Active Self  prednisoLONE acetate (PRED FORTE) 1 % ophthalmic suspension [id number]  Place 1 drop into the right eye 3 (three) times daily. [provider]  Consider Medication Status and Discontinue (Completed Course)   Red Yeast Rice 600 MG CAPS [id number] Yes Take 1,200 mg by mouth daily. [provider] Taking Active Self  rOPINIRole  (REQUIP ) 2 MG tablet [id number] Yes TAKE 1 TABLET(2 MG) BY MOUTH AT BEDTIME Ngetich, [name] C, NP Taking Active Self  rosuvastatin  (CRESTOR ) 10 MG tablet [id number] Yes Take 2 tablets (20 mg total) by mouth daily. Bridgette Campus, MD Taking Active   tirzepatide [facility]) 7.5 MG/0.5ML Pen [id number] Yes Inject 7.5 mg into the skin once a week. Shamleffer, Julian Obey, MD Taking Active   tiZANidine  (ZANAFLEX ) 2 MG tablet [id number] Yes TAKE 1 TABLET BY MOUTH TWICE DAILY Ngetich, [name] C, NP Taking Active   topiramate  (TOPAMAX ) 25 MG tablet [id number] Yes TAKE 1 TABLET BY MOUTH EVERY DAY Ngetich, [name] C, NP Taking Active   torsemide (DEMADEX) 100 MG tablet [id number] Yes Take 100 mg by mouth daily. Patient takes 1/2 tablet in the morning and 1/2 tablet in the afternoon [provider] Taking Active   traMADol  (ULTRAM ) 50 MG tablet [id number] Yes TAKE 1 TABLET(50 MG) BY MOUTH TWICE DAILY AS NEEDED Ngetich, [name] C, NP Taking Active   traZODone  (  DESYREL ) 50 MG tablet [id number] Yes TAKE 2 TABLETS(100 MG) BY MOUTH AT BEDTIME Ngetich, [name] C, NP Taking Active   triamcinolone  cream (KENALOG ) 0.1 % [id number] Yes Apply 1 Application topically. [provider] Taking Active   Vitamin D , Ergocalciferol , (DRISDOL ) 1.25 MG (50000 UNIT) CAPS capsule [id number] Yes TAKE 1 CAPSULE BY MOUTH EVERY 7 DAYS Ngetich, Elijio Guadeloupe, NP Taking Active   Med List Note (Horton, Jeannetta, CPhT 09/07/21 0908): Cpap 12.5 pressure            Recommendation:   PCP Follow-up Specialty provider follow-up [facility] scheduled for:  07/29/23 at 10:30 AM; Follow-up with [facility] scheduled for 07/30/23 at 10:00 AM  Follow Up Plan:   Telephone follow up appointment date/time:  Friday, June 20 at 2:15 PM  Louanne Roussel RN BSN CCM [place]  [facility] Nurse Care Coordinator  Direct Dial: [phone] Website: [email or web address]

## 2023-07-17 ENCOUNTER — Other Ambulatory Visit: Payer: Self-pay | Admitting: Internal Medicine

## 2023-07-22 ENCOUNTER — Other Ambulatory Visit: Payer: Self-pay | Admitting: Family

## 2023-07-22 DIAGNOSIS — B379 Candidiasis, unspecified: Secondary | ICD-10-CM

## 2023-07-22 DIAGNOSIS — G8929 Other chronic pain: Secondary | ICD-10-CM

## 2023-07-22 DIAGNOSIS — E1142 Type 2 diabetes mellitus with diabetic polyneuropathy: Secondary | ICD-10-CM

## 2023-07-22 NOTE — Progress Notes (Signed)
 Remote pacemaker transmission.

## 2023-07-23 ENCOUNTER — Telehealth: Payer: Self-pay | Admitting: Family

## 2023-07-23 DIAGNOSIS — M542 Cervicalgia: Secondary | ICD-10-CM

## 2023-07-23 NOTE — Telephone Encounter (Signed)
 Nystatin -Medication not on patient active med list.  Patient is requesting a refill of the following medications: Requested Prescriptions   Pending Prescriptions Disp Refills   traMADol  (ULTRAM ) 50 MG tablet [Pharmacy Med Name: TRAMADOL  50MG  TABLETS] 180 tablet 0    Sig: TAKE 1 TABLET(50 MG) BY MOUTH TWICE DAILY AS NEEDED   Refused Prescriptions Disp Refills   NYSTATIN  powder [Pharmacy Med Name: NYSTOP  TOP PWDR 100,000 60GM] 60 g 3    Sig: APPLY TOPICALLY TO THE AFFECTED AREA TWICE DAILY    Refused By: Lynnette Saucer    Reason for Refusal: Refill not appropriate    Date of last refill:03/04/2023  Refill amount: 180  Treatment agreement date: No treatment agreement on file, notation made on pending appointment 10/15/2023

## 2023-07-24 ENCOUNTER — Telehealth: Payer: Self-pay | Admitting: Student

## 2023-07-24 NOTE — Telephone Encounter (Signed)
 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 Otherwise he has only noticed feeling tired, but cannot necessarily confirm being aware of when his heart goes in/ out of rhythm. I offered follow up in the A-Fib clinic for his ongoing episode and the patient voices understanding and is agreeable. He was seen by Minnie Amber, PA on 12/26/22.  I advised the patient that his neck pain may be contributing to his a-fib occurrence, but have the A-fib clinic reach out to him to schedule a follow up to advise on any further recommendations at this time.  Confirmed with the patient he is still taking Eliquis  5 mg BID with no missed doses.

## 2023-07-26 ENCOUNTER — Telehealth: Payer: Self-pay

## 2023-07-26 DIAGNOSIS — E119 Type 2 diabetes mellitus without complications: Secondary | ICD-10-CM | POA: Diagnosis not present

## 2023-07-26 DIAGNOSIS — Z794 Long term (current) use of insulin: Secondary | ICD-10-CM | POA: Diagnosis not present

## 2023-07-26 DIAGNOSIS — K56609 Unspecified intestinal obstruction, unspecified as to partial versus complete obstruction: Secondary | ICD-10-CM | POA: Diagnosis not present

## 2023-07-26 DIAGNOSIS — G4733 Obstructive sleep apnea (adult) (pediatric): Secondary | ICD-10-CM | POA: Diagnosis not present

## 2023-07-26 DIAGNOSIS — E118 Type 2 diabetes mellitus with unspecified complications: Secondary | ICD-10-CM | POA: Diagnosis not present

## 2023-07-26 DIAGNOSIS — Z933 Colostomy status: Secondary | ICD-10-CM | POA: Diagnosis not present

## 2023-07-26 NOTE — Telephone Encounter (Signed)
 Noted

## 2023-07-26 NOTE — Telephone Encounter (Signed)
 Copied from CRM [id number]. Topic: Clinical - Home Health Verbal Orders >> Jul 26, 2023  2:51 PM Carrielelia G wrote: Caller/Agency: Shelvy Dickens with [facility] Number: [phone] Service Requested: Physical Therapy The patient has requested a delay in care. He has delayed his start to July 31, 2023

## 2023-07-27 DIAGNOSIS — M542 Cervicalgia: Secondary | ICD-10-CM | POA: Diagnosis not present

## 2023-07-27 DIAGNOSIS — B029 Zoster without complications: Secondary | ICD-10-CM | POA: Diagnosis not present

## 2023-07-27 DIAGNOSIS — R21 Rash and other nonspecific skin eruption: Secondary | ICD-10-CM | POA: Diagnosis not present

## 2023-07-28 NOTE — Telephone Encounter (Signed)
 HH ordered.

## 2023-07-29 ENCOUNTER — Ambulatory Visit: Admitting: General Practice

## 2023-07-30 ENCOUNTER — Ambulatory Visit (HOSPITAL_COMMUNITY)
Admission: RE | Admit: 2023-07-30 | Discharge: 2023-07-30 | Disposition: A | Discharge status: Home / Self Care | Source: Ambulatory Visit | Attending: Internal Medicine | Admitting: Internal Medicine

## 2023-07-30 ENCOUNTER — Encounter: Payer: Self-pay | Admitting: Urology

## 2023-07-30 ENCOUNTER — Ambulatory Visit: Admitting: Urology

## 2023-07-30 VITALS — BP 100/52 | Ht 72.0 in | Wt >= 6400 oz

## 2023-07-30 VITALS — BP 127/69 | HR 70

## 2023-07-30 DIAGNOSIS — N401 Enlarged prostate with lower urinary tract symptoms: Secondary | ICD-10-CM

## 2023-07-30 DIAGNOSIS — D6869 Other thrombophilia: Secondary | ICD-10-CM

## 2023-07-30 DIAGNOSIS — Z87440 Personal history of urinary (tract) infections: Secondary | ICD-10-CM

## 2023-07-30 DIAGNOSIS — E119 Type 2 diabetes mellitus without complications: Secondary | ICD-10-CM | POA: Diagnosis not present

## 2023-07-30 DIAGNOSIS — K56609 Unspecified intestinal obstruction, unspecified as to partial versus complete obstruction: Secondary | ICD-10-CM | POA: Diagnosis not present

## 2023-07-30 DIAGNOSIS — I4891 Unspecified atrial fibrillation: Secondary | ICD-10-CM | POA: Diagnosis not present

## 2023-07-30 DIAGNOSIS — Z794 Long term (current) use of insulin: Secondary | ICD-10-CM | POA: Diagnosis not present

## 2023-07-30 DIAGNOSIS — Z933 Colostomy status: Secondary | ICD-10-CM | POA: Diagnosis not present

## 2023-07-30 DIAGNOSIS — I4819 Other persistent atrial fibrillation: Secondary | ICD-10-CM | POA: Diagnosis not present

## 2023-07-30 DIAGNOSIS — N3281 Overactive bladder: Secondary | ICD-10-CM | POA: Diagnosis not present

## 2023-07-30 DIAGNOSIS — N138 Other obstructive and reflux uropathy: Secondary | ICD-10-CM | POA: Diagnosis not present

## 2023-07-30 DIAGNOSIS — C642 Malignant neoplasm of left kidney, except renal pelvis: Secondary | ICD-10-CM

## 2023-07-30 DIAGNOSIS — E118 Type 2 diabetes mellitus with unspecified complications: Secondary | ICD-10-CM | POA: Diagnosis not present

## 2023-07-30 LAB — URINALYSIS, ROUTINE W REFLEX MICROSCOPIC
Bilirubin, UA: NEGATIVE
Glucose, UA: NEGATIVE
Ketones, UA: NEGATIVE
Leukocytes,UA: NEGATIVE
Nitrite, UA: NEGATIVE
RBC, UA: NEGATIVE
Specific Gravity, UA: 1.02 (ref 1.005–1.030)
Urobilinogen, Ur: 0.2 mg/dL (ref 0.2–1.0)
pH, UA: 6 (ref 5.0–7.5)

## 2023-07-30 LAB — MICROSCOPIC EXAMINATION

## 2023-07-30 MED ORDER — GEMTESA 75 MG PO TABS: 75.0000 mg | ORAL_TABLET | Freq: Every day | ORAL | Status: DC

## 2023-07-30 NOTE — Progress Notes (Signed)
 Assessment: 1. OAB (overactive bladder)   2. BPH with obstruction/lower urinary tract symptoms   3. Renal cell carcinoma of left kidney (HCC)   4. History of UTI     Plan: I personally reviewed the patient's chart including provider notes, lab and imaging results. His exam is not consistent with prostatitis.  I think his history and symptoms are more suggestive of overactive bladder. Continue alfuzosin  10 mg daily. Trial of Gemtesa 75 mg daily.  Samples provided. Return to office in 1 month.  Chief Complaint:  Chief Complaint  Patient presents with   Benign Prostatic Hypertrophy    History of Present Illness:  Jesse [name] is a 73 y.o. male who is seen in consultation from Ngetich, [name] C, NP for evaluation of BPH with lower urinary tract symptoms and left renal cell carcinoma.  He has a significant urologic history and was previously seen by Dr. Derrick Fling at [facility]. He was diagnosed with a left renal mass, initially found in December 2023 on CT imaging.  CT from September 24, 2022 showed a 4.3 cm left renal mass with possible early venous invasion.  Management complicated by a large ventral abdominal wall hernia and chronic kidney disease with a GFR of 38.  He also has COPD and is on home O2.  He was referred to [facility] for management of the left renal mass.  He underwent percutaneous cryoablation on 12/11/2022.  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  Follow-up imaging with CT scan on 03/29/2023 showed a masslike enhancement along the lower aspect of the ablation bed.  He subsequently underwent treatment of recurrent disease with radiation therapy, which he completed in April 2025.  He has a long history of lower urinary tract symptoms with frequency, urgency, and weak stream.  He has been managed with alfuzosin . He reports symptoms of frequency, urgency, occasional urge incontinence.  He voids 10 times per day and 1-2 times per night.  He reports a good  stream.  He is not having any dysuria or gross hematuria.  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  He feels the need to have a bowel movement in association with his urinary symptoms.  He was previously treated with a 42-day course of an antibiotic without significant improvement. IPSS = 18/4.  He has a prior history of UTIs.   Urine culture from 06/17/2023 grew 50-100 K Serratia. Urine culture from 07/02/2023 showed no growth.  He has a history of erectile dysfunction.  He is on Imdur .  He has a history of nephrolithiasis requiring ureteroscopy in the past.  Past Medical History:  Past Medical History:  Diagnosis Date   Alcohol use disorder in remission 1999   Anemia    iron deficiency   Anxiety and depression    Arthritis    ankles, knees and hands   At high risk for falls    Basal cell carcinoma    x4 (on both shoulders, one on forehead, one on nose)   CKD stage 3b, GFR 30-44 ml/min (HCC)    Former cigarette smoker 1999   Generalized anxiety disorder    Hard of hearing    Headache    History of kidney stones    Hyperlipidemia due to type 2 diabetes mellitus (HCC)    Hypertension    Morbid obesity (HCC)    Neuropathy    O2 dependent    2L   Presence of permanent cardiac pacemaker    Renal cell carcinoma of left  kidney [facility])    pt had cryotherapy for this at [facility] 12/11/22)   Risk for falls    Sleep apnea    uses CPAP   TIA (transient ischemic attack)    pt states this was not confirmed due to MRI unable to be performed   Type 2 diabetes mellitus (HCC)    Ventral hernia with bowel obstruction    Per Hospital Encounter on 05/21/22    Past Surgical History:  Past Surgical History:  Procedure Laterality Date   APPENDECTOMY  1980   BREAST SURGERY Left    "removed part of breast due to gynecomastia"   COLOSTOMY  1989   and reversal same year   COLOSTOMY  2017   FINGER ARTHRODESIS Right 02/04/2023   Procedure: RIGHT INDEX FINGER PROXIMAL INTERPHALANGEAL  JOINT ARTHRODESIS;  Surgeon: Rober Chimera, MD;  Location: [facility] OR;  Service: Orthopedics;  Laterality: Right;   IR THORACENTESIS ASP PLEURAL SPACE W/IMG GUIDE  05/18/2021   IR THORACENTESIS ASP PLEURAL SPACE W/IMG GUIDE  10/06/2021   KNEE SURGERY Right    NASAL SEPTUM SURGERY  1982   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   Procedure: PACEMAKER IMPLANT;  Surgeon: Verona Goodwill, MD;  Location: [facility] INVASIVE CV LAB;  Service: Cardiovascular;  Laterality: N/A;   RENAL CRYOABLATION  12/11/2022   VARICOCELECTOMY  1983    Allergies:  Allergies  Allergen Reactions   Tape Rash    If left on for a long time    Family History:  Family History  Problem Relation Age of Onset   Diabetes Brother    Bipolar disorder Daughter    Anxiety disorder Son     Social History:  Social History   Tobacco Use   Smoking status: Former    Current packs/day: 0.00    Types: Cigarettes    Quit date: 1999    Years since quitting: 26.4   Smokeless tobacco: Never  Vaping Use   Vaping status: Never Used  Substance Use Topics   Alcohol use: Not Currently    Comment: no alcohol since 1999   Drug use: Never    Review of symptoms:  Constitutional:  Negative for unexplained weight loss, night sweats, fever, chills ENT:  Negative for nose bleeds, sinus pain, painful swallowing CV:  Negative for chest pain, shortness of breath, exercise intolerance, palpitations, loss of consciousness Resp:  Negative for cough, wheezing, shortness of breath GI:  Negative for nausea, vomiting, diarrhea, bloody stools GU:  Positives noted in HPI; otherwise negative for gross hematuria, dysuria Neuro:  Negative for seizures, poor balance, limb weakness, slurred speech Psych:  Negative for lack of energy, depression, anxiety Endocrine:  Negative for polydipsia, polyuria, symptoms of hypoglycemia (dizziness, hunger, sweating) Hematologic:  Negative for anemia, purpura, petechia, prolonged or excessive bleeding, use of  anticoagulants  Allergic:  Negative for difficulty breathing or choking as a result of exposure to anything; no shellfish allergy; no allergic response (rash/itch) to materials, foods  Physical exam: BP 127/69   Pulse 70  GENERAL APPEARANCE:  Well appearing, well developed, well nourished, NAD HEENT: Atraumatic, Normocephalic, oropharynx clear. NECK: Supple  LUNGS: Clear to auscultation bilaterally. HEART: Regular Rate and Rhythm without murmurs, gallops, or rubs. ABDOMEN: Soft, non-tender, No Masses. EXTREMITIES: Moves all extremities well.  Without clubbing, cyanosis, or edema. NEUROLOGIC:  Alert and oriented x 3, in wheelchair, CN II-XII grossly intact.  MENTAL STATUS:  Appropriate. BACK:  Non-tender to palpation.  No CVAT SKIN:  Warm, dry and intact.  GU: Prostate: 50 g, NT, no nodules Rectum: Normal tone,  no masses or tenderness   Results: U/A: 0-5 WBCs, 0-2 RBCs

## 2023-07-30 NOTE — Progress Notes (Signed)
 Primary Care Physician: Estil Heman, NP Primary Cardiologist: Dr. Amanda Jungling Primary Electrophysiologist: Dr. Rodolfo Clan Referring Physician:    MUHAMMAD VACCA is a 73 y.o. 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 Device alert on 5/9 showing Afib with 1.3% burden. Patient has a CHADS2VASC score of 7.  On evaluation today, he is currently in NSR. He did not have cardiac awareness of Afib episode. He is on continuous oxygen . He takes Plavix  daily due to history of TIA. He does not drink alcohol, uses his CPAP, and drinks 1-2 cups of coffee daily.  On follow up 08/13/22, he is currently in NSR. Device report showed 3.4% Afib burden with episodes on 5/10, 5/17, and 5/24. The episode on 5/17 lasted for 7 days. No episodes in June. He is compliant with Eliquis  overall; may have missed 1 dose a couple of weeks ago.   On follow up 12/26/22, he is currently in NSR. Device report from October showed <1% Afib burden within monitoring period. He is compliant with Eliquis  and has no bleeding issues. He is recently s/p left renal tumor cryoablation and biopsy at [facility] on 12/11/22.   On follow up 07/30/23, he is currently in V paced rhythm. Device clinic alert on 6/4 for ongoing Afib since 6/2 with controlled HR. He noted at that time neck pain and subsequently diagnosed with Shingles. He is being treated for it currently. No missed doses of Eliquis .   Today, he denies symptoms of palpitations, chest pain, shortness of breath, orthopnea, PND, lower extremity edema, dizziness, presyncope, syncope, snoring, daytime  somnolence, bleeding, or neurologic sequela. The patient is tolerating medications without difficulties and is otherwise without complaint today.   Atrial Fibrillation Risk Factors:  he does have symptoms or diagnosis of sleep apnea. he is compliant with CPAP therapy. he does not have a history of rheumatic fever. he does not have a history of alcohol use. The patient does not have a history of early familial atrial fibrillation or other arrhythmias.  he has a BMI of Body mass index is 54.25 kg/m.Aaron Aas Filed Weights   07/30/23 1418  Weight: (!) 181.4 kg      Family History  Problem Relation Age of Onset   Diabetes Brother    Bipolar disorder Daughter    Anxiety disorder Son     Atrial Fibrillation Management history:  Previous antiarrhythmic drugs: None Previous cardioversions: None Previous ablations: None Anticoagulation history: Eliquis  5 mg BID   Past Medical History:  Diagnosis Date   Alcohol use disorder in remission 1999   Anemia    iron deficiency   Anxiety and depression    Arthritis    ankles, knees and hands   At high risk for falls    Basal cell carcinoma    x4 (on both shoulders, one on forehead, one on nose)   CKD stage 3b, GFR 30-44 ml/min (HCC)    Former cigarette smoker 1999   Generalized anxiety disorder    Hard of hearing    Headache    History of kidney stones    Hyperlipidemia due to type 2 diabetes mellitus (HCC)    Hypertension  Morbid obesity (HCC)    Neuropathy    O2 dependent    2L   Presence of permanent cardiac pacemaker    Renal cell carcinoma of left kidney (HCC)    pt had cryotherapy for this at [facility] 12/11/22)   Risk for falls    Sleep apnea    uses CPAP   TIA (transient ischemic attack)    pt states this was not confirmed due to MRI unable to be performed   Type 2 diabetes mellitus (HCC)    Ventral hernia with bowel obstruction    Per Hospital Encounter on 05/21/22   Past Surgical History:  Procedure Laterality Date    APPENDECTOMY  1980   BREAST SURGERY Left    "removed part of breast due to gynecomastia"   COLOSTOMY  1989   and reversal same year   COLOSTOMY  2017   FINGER ARTHRODESIS Right 02/04/2023   Procedure: RIGHT INDEX FINGER PROXIMAL INTERPHALANGEAL JOINT ARTHRODESIS;  Surgeon: Rober Chimera, MD;  Location: [facility] OR;  Service: Orthopedics;  Laterality: Right;   IR THORACENTESIS ASP PLEURAL SPACE W/IMG GUIDE  05/18/2021   IR THORACENTESIS ASP PLEURAL SPACE W/IMG GUIDE  10/06/2021   KNEE SURGERY Right    NASAL SEPTUM SURGERY  1982   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   Procedure: PACEMAKER IMPLANT;  Surgeon: Verona Goodwill, MD;  Location: [facility] INVASIVE CV LAB;  Service: Cardiovascular;  Laterality: N/A;   RENAL CRYOABLATION  12/11/2022   VARICOCELECTOMY  1983    Current Outpatient Medications  Medication Sig Dispense Refill   acetaminophen  (TYLENOL ) 325 MG tablet Take 2 tablets (650 mg total) by mouth every 6 (six) hours. (Patient taking differently: Take 650 mg by mouth as needed.)     alfuzosin  (UROXATRAL ) 10 MG 24 hr tablet TAKE 1 TABLET BY MOUTH EVERY DAY 90 tablet 1   ALPRAZolam  (XANAX ) 0.5 MG tablet TAKE 1 TABLET(0.5 MG) BY MOUTH TWICE DAILY AS NEEDED FOR ANXIETY OR SLEEP 60 tablet 5   apixaban  (ELIQUIS ) 5 MG TABS tablet TAKE 1 TABLET(5 MG) BY MOUTH TWICE DAILY 60 tablet 5   Cholecalciferol (VITAMIN D3 SUPER STRENGTH) 50 MCG (2000 UT) TABS Take 2,000 Units by mouth daily.     Continuous Glucose Sensor (DEXCOM G7 SENSOR) MISC 1 Device by Does not apply route as directed. 9 each 3   Cyanocobalamin  (B-12 PO) Take 1 tablet by mouth daily.     diclofenac  Sodium (VOLTAREN ) 1 % GEL Apply 2 g topically 4 (four) times daily. Apply to your right neck and shoulder area 3-4 times a day as needed (Patient taking differently: Apply 2 g topically 4 (four) times daily as needed (pain). Apply to your right neck and shoulder area 3-4 times a day as needed) 350 g 1   DULoxetine  (CYMBALTA ) 60 MG capsule TAKE 1  CAPSULE(60 MG) BY MOUTH DAILY 90 capsule 1   Ferrous Sulfate  (IRON PO) Take 2 tablets by mouth daily.     HYDROcodone -acetaminophen  (NORCO/VICODIN) 5-325 MG tablet Take 1 tablet by mouth at bedtime as needed for moderate pain (pain score 4-6). 30 tablet 0   insulin  aspart (NOVOLOG ) 100 UNIT/ML injection Max daily 100 units 100 mL 3   isosorbide  mononitrate (IMDUR ) 60 MG 24 hr tablet TAKE 1 TABLET(60 MG) BY MOUTH DAILY 90 tablet 1   JARDIANCE  25 MG TABS tablet Take 25 mg by mouth 2 (two) times daily.     ketoconazole  (NIZORAL ) 2 % cream APPLY TOPICALLY TO THE AFFECTED AREA AS NEEDED  FOR IRRITATION 15 g 5   methocarbamol  (ROBAXIN ) 500 MG tablet TAKE 1 TABLET(500 MG) BY MOUTH EVERY 8 HOURS AS NEEDED FOR MUSCLE SPASMS 90 tablet 5   methylPREDNISolone (MEDROL DOSEPAK) 4 MG TBPK tablet See admin instructions.     metolazone  (ZAROXOLYN ) 2.5 MG tablet Take 1 tablet (2.5 mg total) by mouth daily. Take 1-2 times a week as needed , spacing out 3 days between doses 90 tablet 1   ondansetron  (ZOFRAN ) 4 MG tablet TAKE 1 TABLET(4 MG) BY MOUTH EVERY 8 HOURS AS NEEDED FOR NAUSEA OR VOMITING (Patient taking differently: as needed.) 30 tablet 2   OXYGEN  Inhale 2 L into the lungs continuous.     pantoprazole  (PROTONIX ) 40 MG tablet TAKE 1 TABLET(40 MG) BY MOUTH TWICE DAILY BEFORE A MEAL 180 tablet 1   potassium chloride  SA (KLOR-CON  M) 20 MEQ tablet Take 1 tablet (20 mEq total) by mouth 2 (two) times daily.     Red Yeast Rice 600 MG CAPS Take 1,200 mg by mouth daily.     rOPINIRole  (REQUIP ) 2 MG tablet TAKE 1 TABLET(2 MG) BY MOUTH AT BEDTIME 90 tablet 3   rosuvastatin  (CRESTOR ) 10 MG tablet Take 2 tablets (20 mg total) by mouth daily. 90 tablet 2   tirzepatide (MOUNJARO) 7.5 MG/0.5ML Pen Inject 7.5 mg into the skin once a week. 6 mL 3   tiZANidine  (ZANAFLEX ) 2 MG tablet TAKE 1 TABLET BY MOUTH TWICE DAILY 180 tablet 1   topiramate  (TOPAMAX ) 25 MG tablet TAKE 1 TABLET BY MOUTH EVERY DAY 90 tablet 2   torsemide  (DEMADEX) 100 MG tablet Take 100 mg by mouth daily. Patient takes 1/2 tablet in the morning and 1/2 tablet in the afternoon     traMADol  (ULTRAM ) 50 MG tablet TAKE 1 TABLET(50 MG) BY MOUTH TWICE DAILY AS NEEDED 180 tablet 0   traZODone  (DESYREL ) 50 MG tablet TAKE 2 TABLETS(100 MG) BY MOUTH AT BEDTIME 60 tablet 5   triamcinolone  cream (KENALOG ) 0.1 % Apply 1 Application topically as needed.     valACYclovir (VALTREX) 1000 MG tablet Take 3,000 mg by mouth daily.     Vibegron (GEMTESA) 75 MG TABS Take 1 tablet (75 mg total) by mouth daily.     Vitamin D , Ergocalciferol , (DRISDOL ) 1.25 MG (50000 UNIT) CAPS capsule TAKE 1 CAPSULE BY MOUTH EVERY 7 DAYS 5 capsule 1   No current facility-administered medications for this encounter.    Allergies  Allergen Reactions   Tape Rash    If left on for a long time    ROS- All systems are reviewed and negative except as per the HPI above.  Physical Exam: Vitals:   07/30/23 1418  BP: (!) 100/52  Weight: (!) 181.4 kg  Height: 6' (1.829 m)    GEN- The patient is well appearing, alert and oriented x 3 today.   Neck - no JVD or carotid bruit noted Lungs- Clear to ausculation bilaterally, normal work of breathing Heart- Regular rate (V paced), no murmurs, rubs or gallops, PMI not laterally displaced Extremities- no clubbing, cyanosis, or edema   Wt Readings from Last 3 Encounters:  07/30/23 (!) 181.4 kg  07/02/23 (!) 176.9 kg  06/17/23 (!) 181.4 kg    EKG today demonstrates  Vent. rate 70 BPM PR interval * ms QRS duration 148 ms QT/QTcB 444/479 ms P-R-T axes * 74 -3 Ventricular-paced rhythm Abnormal ECG When compared with ECG of 26-Dec-2022 11:45, V paced has replaced AV paced rhythm  Echo 11/02/21  demonstrated: 1. Left ventricular ejection fraction, by estimation, is 70 to 75%. Left  ventricular ejection fraction by 2D MOD biplane is 71.7 %. The left  ventricle has hyperdynamic function. The left ventricle has no regional  wall motion  abnormalities. There is  severe left ventricular hypertrophy. Left ventricular diastolic parameters  are consistent with Grade I diastolic dysfunction (impaired relaxation).   2. Right ventricular systolic function is normal. The right ventricular  size is normal.   3. Left atrial size was moderately dilated.   4. Right atrial size was moderately dilated.   5. The mitral valve was not well visualized. No evidence of mitral valve  regurgitation.   6. The aortic valve was not well visualized. Aortic valve regurgitation  is not visualized.   7. Aortic dilatation noted. There is mild dilatation of the aortic root,  measuring 43 mm.   8. Agitated saline contrast bubble study was negative, with no evidence  of any interatrial shunt.   Comparison(s): Changes from prior study are noted. 05/14/2021: LVEF 60-65%,  aortic root dilated to 41 mm.   Epic records are reviewed at length today.  CHA2DS2-VASc Score = 7  The patient's score is based upon: CHF History: 1 HTN History: 1 Diabetes History: 1 Stroke History: 2 Vascular Disease History: 1 Age Score: 1 Gender Score: 0       ASSESSMENT AND PLAN: Persistent Atrial Fibrillation (ICD10:  I48.0) The patient's CHA2DS2-VASc score is 7, indicating a 11.2% annual risk of stroke.    He is currently in V paced rhythm. We discussed cardioversion as a procedure to try to convert to NSR. Due to current Shingles, will revisit necessity for cardioversion in 6 weeks. If he is still out of rhythm at that time, will proceed with scheduling cardioversion. Patient is agreeable with plan.   Secondary Hypercoagulable State (ICD10:  D68.69) The patient is at significant risk for stroke/thromboembolism based upon his CHA2DS2-VASc Score of 7.  Start Apixaban  (Eliquis ).  No missed doses of Eliquis  5 mg BID.    Obstructive sleep apnea Compliant with CPAP    Follow up 6 weeks to determine if needs cardioversion.    Minnie Amber, PA-C Afib  Clinic [facility] [address] [place], [place] [address] [phone] 07/30/2023 3:06 PM

## 2023-07-31 ENCOUNTER — Encounter: Payer: Self-pay | Admitting: Urology

## 2023-07-31 DIAGNOSIS — B029 Zoster without complications: Secondary | ICD-10-CM | POA: Diagnosis not present

## 2023-07-31 DIAGNOSIS — M25511 Pain in right shoulder: Secondary | ICD-10-CM | POA: Diagnosis not present

## 2023-07-31 DIAGNOSIS — E1122 Type 2 diabetes mellitus with diabetic chronic kidney disease: Secondary | ICD-10-CM | POA: Diagnosis not present

## 2023-07-31 DIAGNOSIS — I130 Hypertensive heart and chronic kidney disease with heart failure and stage 1 through stage 4 chronic kidney disease, or unspecified chronic kidney disease: Secondary | ICD-10-CM | POA: Diagnosis not present

## 2023-07-31 DIAGNOSIS — E1169 Type 2 diabetes mellitus with other specified complication: Secondary | ICD-10-CM | POA: Diagnosis not present

## 2023-07-31 DIAGNOSIS — I509 Heart failure, unspecified: Secondary | ICD-10-CM | POA: Diagnosis not present

## 2023-07-31 DIAGNOSIS — M542 Cervicalgia: Secondary | ICD-10-CM | POA: Diagnosis not present

## 2023-07-31 DIAGNOSIS — N1832 Chronic kidney disease, stage 3b: Secondary | ICD-10-CM | POA: Diagnosis not present

## 2023-07-31 DIAGNOSIS — D631 Anemia in chronic kidney disease: Secondary | ICD-10-CM | POA: Diagnosis not present

## 2023-08-01 ENCOUNTER — Other Ambulatory Visit: Payer: Self-pay | Admitting: Family

## 2023-08-01 NOTE — Telephone Encounter (Signed)
 Patient is requesting refill on medication. Patient medication just refilled in March 2025 with 90 tablets and 1 refill. Medication pend and sent to PCP Ngetich, [name] C, NP for approval.

## 2023-08-06 ENCOUNTER — Other Ambulatory Visit: Payer: Self-pay | Admitting: Internal Medicine

## 2023-08-06 ENCOUNTER — Telehealth: Payer: Self-pay

## 2023-08-06 NOTE — Telephone Encounter (Signed)
 Pt called in stating he doesn't feel well and he was told to call in and send a transmission. Pt states he has been in afib but his pcp thinks its because he has shingles. Pt just would like a call back for reassurance about his transmission once received

## 2023-08-07 DIAGNOSIS — M542 Cervicalgia: Secondary | ICD-10-CM | POA: Diagnosis not present

## 2023-08-07 DIAGNOSIS — D631 Anemia in chronic kidney disease: Secondary | ICD-10-CM | POA: Diagnosis not present

## 2023-08-07 DIAGNOSIS — M25511 Pain in right shoulder: Secondary | ICD-10-CM | POA: Diagnosis not present

## 2023-08-07 DIAGNOSIS — B029 Zoster without complications: Secondary | ICD-10-CM | POA: Diagnosis not present

## 2023-08-07 DIAGNOSIS — I130 Hypertensive heart and chronic kidney disease with heart failure and stage 1 through stage 4 chronic kidney disease, or unspecified chronic kidney disease: Secondary | ICD-10-CM | POA: Diagnosis not present

## 2023-08-07 DIAGNOSIS — E1169 Type 2 diabetes mellitus with other specified complication: Secondary | ICD-10-CM | POA: Diagnosis not present

## 2023-08-07 DIAGNOSIS — I509 Heart failure, unspecified: Secondary | ICD-10-CM | POA: Diagnosis not present

## 2023-08-07 DIAGNOSIS — N1832 Chronic kidney disease, stage 3b: Secondary | ICD-10-CM | POA: Diagnosis not present

## 2023-08-07 DIAGNOSIS — E1122 Type 2 diabetes mellitus with diabetic chronic kidney disease: Secondary | ICD-10-CM | POA: Diagnosis not present

## 2023-08-07 NOTE — Telephone Encounter (Signed)
 Waiting on remote transmission.

## 2023-08-07 NOTE — Telephone Encounter (Signed)
 Attempted to assist patient with remote transmission. Box has yellow light that is staying on.   [place] number provided to patient who states he will call in about 30-45 minutes.

## 2023-08-08 NOTE — Telephone Encounter (Signed)
 Remote transmission received. Normal device function. Presenting: AS/VP. HX PAF, overall rates controlled rates, + OAC.   Patient states he is feeling better today. Updated pt to remote and advised patient to follow up with PCP further. Patient was appreciative of call back and voiced understanding.

## 2023-08-09 ENCOUNTER — Telehealth

## 2023-08-09 DIAGNOSIS — M25511 Pain in right shoulder: Secondary | ICD-10-CM | POA: Diagnosis not present

## 2023-08-09 DIAGNOSIS — M542 Cervicalgia: Secondary | ICD-10-CM | POA: Diagnosis not present

## 2023-08-09 DIAGNOSIS — I130 Hypertensive heart and chronic kidney disease with heart failure and stage 1 through stage 4 chronic kidney disease, or unspecified chronic kidney disease: Secondary | ICD-10-CM | POA: Diagnosis not present

## 2023-08-09 DIAGNOSIS — I509 Heart failure, unspecified: Secondary | ICD-10-CM | POA: Diagnosis not present

## 2023-08-09 DIAGNOSIS — D631 Anemia in chronic kidney disease: Secondary | ICD-10-CM | POA: Diagnosis not present

## 2023-08-09 DIAGNOSIS — E1122 Type 2 diabetes mellitus with diabetic chronic kidney disease: Secondary | ICD-10-CM | POA: Diagnosis not present

## 2023-08-09 DIAGNOSIS — B029 Zoster without complications: Secondary | ICD-10-CM | POA: Diagnosis not present

## 2023-08-09 DIAGNOSIS — E1169 Type 2 diabetes mellitus with other specified complication: Secondary | ICD-10-CM | POA: Diagnosis not present

## 2023-08-09 DIAGNOSIS — N1832 Chronic kidney disease, stage 3b: Secondary | ICD-10-CM | POA: Diagnosis not present

## 2023-08-13 DIAGNOSIS — Z933 Colostomy status: Secondary | ICD-10-CM | POA: Diagnosis not present

## 2023-08-13 DIAGNOSIS — Z794 Long term (current) use of insulin: Secondary | ICD-10-CM | POA: Diagnosis not present

## 2023-08-13 DIAGNOSIS — K56609 Unspecified intestinal obstruction, unspecified as to partial versus complete obstruction: Secondary | ICD-10-CM | POA: Diagnosis not present

## 2023-08-13 DIAGNOSIS — E119 Type 2 diabetes mellitus without complications: Secondary | ICD-10-CM | POA: Diagnosis not present

## 2023-08-13 DIAGNOSIS — E118 Type 2 diabetes mellitus with unspecified complications: Secondary | ICD-10-CM | POA: Diagnosis not present

## 2023-08-14 DIAGNOSIS — Z933 Colostomy status: Secondary | ICD-10-CM | POA: Diagnosis not present

## 2023-08-14 DIAGNOSIS — E119 Type 2 diabetes mellitus without complications: Secondary | ICD-10-CM | POA: Diagnosis not present

## 2023-08-14 DIAGNOSIS — Z794 Long term (current) use of insulin: Secondary | ICD-10-CM | POA: Diagnosis not present

## 2023-08-14 DIAGNOSIS — K56609 Unspecified intestinal obstruction, unspecified as to partial versus complete obstruction: Secondary | ICD-10-CM | POA: Diagnosis not present

## 2023-08-14 DIAGNOSIS — E118 Type 2 diabetes mellitus with unspecified complications: Secondary | ICD-10-CM | POA: Diagnosis not present

## 2023-08-15 ENCOUNTER — Other Ambulatory Visit: Payer: Self-pay | Admitting: Family

## 2023-08-15 DIAGNOSIS — K56609 Unspecified intestinal obstruction, unspecified as to partial versus complete obstruction: Secondary | ICD-10-CM | POA: Diagnosis not present

## 2023-08-15 DIAGNOSIS — K219 Gastro-esophageal reflux disease without esophagitis: Secondary | ICD-10-CM

## 2023-08-15 DIAGNOSIS — E119 Type 2 diabetes mellitus without complications: Secondary | ICD-10-CM | POA: Diagnosis not present

## 2023-08-15 DIAGNOSIS — Z933 Colostomy status: Secondary | ICD-10-CM | POA: Diagnosis not present

## 2023-08-15 DIAGNOSIS — E118 Type 2 diabetes mellitus with unspecified complications: Secondary | ICD-10-CM | POA: Diagnosis not present

## 2023-08-15 DIAGNOSIS — F411 Generalized anxiety disorder: Secondary | ICD-10-CM

## 2023-08-15 DIAGNOSIS — Z794 Long term (current) use of insulin: Secondary | ICD-10-CM | POA: Diagnosis not present

## 2023-08-15 NOTE — Telephone Encounter (Signed)
 High risk warning

## 2023-08-16 DIAGNOSIS — Z933 Colostomy status: Secondary | ICD-10-CM | POA: Diagnosis not present

## 2023-08-16 DIAGNOSIS — Z794 Long term (current) use of insulin: Secondary | ICD-10-CM | POA: Diagnosis not present

## 2023-08-16 DIAGNOSIS — K56609 Unspecified intestinal obstruction, unspecified as to partial versus complete obstruction: Secondary | ICD-10-CM | POA: Diagnosis not present

## 2023-08-16 DIAGNOSIS — E118 Type 2 diabetes mellitus with unspecified complications: Secondary | ICD-10-CM | POA: Diagnosis not present

## 2023-08-16 DIAGNOSIS — E119 Type 2 diabetes mellitus without complications: Secondary | ICD-10-CM | POA: Diagnosis not present

## 2023-08-20 DIAGNOSIS — Z85528 Personal history of other malignant neoplasm of kidney: Secondary | ICD-10-CM

## 2023-08-20 DIAGNOSIS — Z8673 Personal history of transient ischemic attack (TIA), and cerebral infarction without residual deficits: Secondary | ICD-10-CM

## 2023-08-20 DIAGNOSIS — M19072 Primary osteoarthritis, left ankle and foot: Secondary | ICD-10-CM

## 2023-08-20 DIAGNOSIS — E1169 Type 2 diabetes mellitus with other specified complication: Secondary | ICD-10-CM | POA: Diagnosis not present

## 2023-08-20 DIAGNOSIS — M170 Bilateral primary osteoarthritis of knee: Secondary | ICD-10-CM

## 2023-08-20 DIAGNOSIS — G4730 Sleep apnea, unspecified: Secondary | ICD-10-CM

## 2023-08-20 DIAGNOSIS — Z794 Long term (current) use of insulin: Secondary | ICD-10-CM

## 2023-08-20 DIAGNOSIS — N1832 Chronic kidney disease, stage 3b: Secondary | ICD-10-CM | POA: Diagnosis not present

## 2023-08-20 DIAGNOSIS — M542 Cervicalgia: Secondary | ICD-10-CM | POA: Diagnosis not present

## 2023-08-20 DIAGNOSIS — Z85828 Personal history of other malignant neoplasm of skin: Secondary | ICD-10-CM

## 2023-08-20 DIAGNOSIS — Z9981 Dependence on supplemental oxygen: Secondary | ICD-10-CM

## 2023-08-20 DIAGNOSIS — I130 Hypertensive heart and chronic kidney disease with heart failure and stage 1 through stage 4 chronic kidney disease, or unspecified chronic kidney disease: Secondary | ICD-10-CM | POA: Diagnosis not present

## 2023-08-20 DIAGNOSIS — Z87442 Personal history of urinary calculi: Secondary | ICD-10-CM

## 2023-08-20 DIAGNOSIS — D631 Anemia in chronic kidney disease: Secondary | ICD-10-CM | POA: Diagnosis not present

## 2023-08-20 DIAGNOSIS — H9190 Unspecified hearing loss, unspecified ear: Secondary | ICD-10-CM

## 2023-08-20 DIAGNOSIS — M19071 Primary osteoarthritis, right ankle and foot: Secondary | ICD-10-CM

## 2023-08-20 DIAGNOSIS — Z792 Long term (current) use of antibiotics: Secondary | ICD-10-CM

## 2023-08-20 DIAGNOSIS — Z7901 Long term (current) use of anticoagulants: Secondary | ICD-10-CM

## 2023-08-20 DIAGNOSIS — B029 Zoster without complications: Secondary | ICD-10-CM | POA: Diagnosis not present

## 2023-08-20 DIAGNOSIS — F32A Depression, unspecified: Secondary | ICD-10-CM

## 2023-08-20 DIAGNOSIS — E1122 Type 2 diabetes mellitus with diabetic chronic kidney disease: Secondary | ICD-10-CM | POA: Diagnosis not present

## 2023-08-20 DIAGNOSIS — M25511 Pain in right shoulder: Secondary | ICD-10-CM | POA: Diagnosis not present

## 2023-08-20 DIAGNOSIS — E1140 Type 2 diabetes mellitus with diabetic neuropathy, unspecified: Secondary | ICD-10-CM

## 2023-08-20 DIAGNOSIS — M19041 Primary osteoarthritis, right hand: Secondary | ICD-10-CM

## 2023-08-20 DIAGNOSIS — D509 Iron deficiency anemia, unspecified: Secondary | ICD-10-CM

## 2023-08-20 DIAGNOSIS — Z950 Presence of cardiac pacemaker: Secondary | ICD-10-CM

## 2023-08-20 DIAGNOSIS — M19042 Primary osteoarthritis, left hand: Secondary | ICD-10-CM

## 2023-08-20 DIAGNOSIS — F411 Generalized anxiety disorder: Secondary | ICD-10-CM

## 2023-08-20 DIAGNOSIS — I509 Heart failure, unspecified: Secondary | ICD-10-CM | POA: Diagnosis not present

## 2023-08-20 DIAGNOSIS — Z6841 Body Mass Index (BMI) 40.0 and over, adult: Secondary | ICD-10-CM

## 2023-08-20 DIAGNOSIS — E785 Hyperlipidemia, unspecified: Secondary | ICD-10-CM

## 2023-08-20 DIAGNOSIS — Z9181 History of falling: Secondary | ICD-10-CM

## 2023-08-20 DIAGNOSIS — Z87891 Personal history of nicotine dependence: Secondary | ICD-10-CM

## 2023-08-22 NOTE — Telephone Encounter (Signed)
 Message routed to Ella Bodo, NP due to PCP Ngetich, [name], NP being out of office.

## 2023-08-23 ENCOUNTER — Other Ambulatory Visit: Payer: Self-pay | Admitting: Adult Health

## 2023-08-23 DIAGNOSIS — G8929 Other chronic pain: Secondary | ICD-10-CM

## 2023-08-23 MED ORDER — HYDROCODONE-ACETAMINOPHEN 5-325 MG PO TABS: 1.0000 | ORAL_TABLET | Freq: Every evening | ORAL | 0 refills | Status: DC | PRN

## 2023-08-23 NOTE — Telephone Encounter (Signed)
 eRx of Hydrocodone  #10 tabs sent to pharmacy.

## 2023-08-25 DIAGNOSIS — G4733 Obstructive sleep apnea (adult) (pediatric): Secondary | ICD-10-CM | POA: Diagnosis not present

## 2023-08-26 ENCOUNTER — Telehealth: Payer: Self-pay

## 2023-08-26 NOTE — Telephone Encounter (Signed)
   Pre-operative Risk Assessment    Patient Name: Jesse [name]  DOB: 20-Jun-1950 MRN: [id number]   Date of last office visit: 05/15/23 JODIE PASSEY Date of next office visit: NONE  Request for Surgical Clearance    Procedure:  Dental Extraction - Amount of Teeth to be Pulled:  1  Date of Surgery:  Clearance TBD                                Surgeon:  GLENDIA JONELLE LLOYD DDS Surgeon's Group or Practice Name:  [facility] Phone number:  [phone] Fax number:  [phone]   Type of Clearance Requested:   - Medical  - Pharmacy:  Hold Apixaban  (Eliquis ) NOT INDICATED   Type of Anesthesia:  Not Indicated   Additional requests/questions:    Signed, Makia Bossi   08/26/2023, 12:30 PM

## 2023-08-26 NOTE — Telephone Encounter (Signed)
   Patient Name: Jesse [name]  DOB: 12/29/1950 MRN: [id number]  Primary Cardiologist: [name], MD (Inactive)  Chart reviewed as part of pre-operative protocol coverage.   Simple dental extractions (i.e. 1-2 teeth) are considered low risk procedures per guidelines and generally do not require any specific cardiac clearance. It is also generally accepted that for simple extractions and dental cleanings, there is no need to interrupt blood thinner therapy.  SBE prophylaxis is not required for the patient from a cardiac standpoint.  I will route this recommendation to the requesting party via Epic fax function and remove from pre-op pool.  Please call with questions.  Damien JAYSON Braver, NP 08/26/2023, 1:12 PM

## 2023-08-27 DIAGNOSIS — Z794 Long term (current) use of insulin: Secondary | ICD-10-CM | POA: Diagnosis not present

## 2023-08-27 DIAGNOSIS — K56609 Unspecified intestinal obstruction, unspecified as to partial versus complete obstruction: Secondary | ICD-10-CM | POA: Diagnosis not present

## 2023-08-27 DIAGNOSIS — E119 Type 2 diabetes mellitus without complications: Secondary | ICD-10-CM | POA: Diagnosis not present

## 2023-08-27 DIAGNOSIS — E118 Type 2 diabetes mellitus with unspecified complications: Secondary | ICD-10-CM | POA: Diagnosis not present

## 2023-08-27 DIAGNOSIS — Z933 Colostomy status: Secondary | ICD-10-CM | POA: Diagnosis not present

## 2023-08-28 DIAGNOSIS — E1169 Type 2 diabetes mellitus with other specified complication: Secondary | ICD-10-CM | POA: Diagnosis not present

## 2023-08-28 DIAGNOSIS — M542 Cervicalgia: Secondary | ICD-10-CM | POA: Diagnosis not present

## 2023-08-28 DIAGNOSIS — E1122 Type 2 diabetes mellitus with diabetic chronic kidney disease: Secondary | ICD-10-CM | POA: Diagnosis not present

## 2023-08-28 DIAGNOSIS — I130 Hypertensive heart and chronic kidney disease with heart failure and stage 1 through stage 4 chronic kidney disease, or unspecified chronic kidney disease: Secondary | ICD-10-CM | POA: Diagnosis not present

## 2023-08-28 DIAGNOSIS — D631 Anemia in chronic kidney disease: Secondary | ICD-10-CM | POA: Diagnosis not present

## 2023-08-28 DIAGNOSIS — N1832 Chronic kidney disease, stage 3b: Secondary | ICD-10-CM | POA: Diagnosis not present

## 2023-08-28 DIAGNOSIS — M25511 Pain in right shoulder: Secondary | ICD-10-CM | POA: Diagnosis not present

## 2023-08-28 DIAGNOSIS — B029 Zoster without complications: Secondary | ICD-10-CM | POA: Diagnosis not present

## 2023-08-28 DIAGNOSIS — I509 Heart failure, unspecified: Secondary | ICD-10-CM | POA: Diagnosis not present

## 2023-09-02 ENCOUNTER — Other Ambulatory Visit: Payer: Self-pay | Admitting: Family

## 2023-09-02 DIAGNOSIS — K219 Gastro-esophageal reflux disease without esophagitis: Secondary | ICD-10-CM

## 2023-09-04 DIAGNOSIS — B029 Zoster without complications: Secondary | ICD-10-CM | POA: Diagnosis not present

## 2023-09-04 DIAGNOSIS — N1832 Chronic kidney disease, stage 3b: Secondary | ICD-10-CM | POA: Diagnosis not present

## 2023-09-04 DIAGNOSIS — E1169 Type 2 diabetes mellitus with other specified complication: Secondary | ICD-10-CM | POA: Diagnosis not present

## 2023-09-04 DIAGNOSIS — M25511 Pain in right shoulder: Secondary | ICD-10-CM | POA: Diagnosis not present

## 2023-09-04 DIAGNOSIS — I509 Heart failure, unspecified: Secondary | ICD-10-CM | POA: Diagnosis not present

## 2023-09-04 DIAGNOSIS — D631 Anemia in chronic kidney disease: Secondary | ICD-10-CM | POA: Diagnosis not present

## 2023-09-04 DIAGNOSIS — M542 Cervicalgia: Secondary | ICD-10-CM | POA: Diagnosis not present

## 2023-09-04 DIAGNOSIS — I130 Hypertensive heart and chronic kidney disease with heart failure and stage 1 through stage 4 chronic kidney disease, or unspecified chronic kidney disease: Secondary | ICD-10-CM | POA: Diagnosis not present

## 2023-09-04 DIAGNOSIS — E1122 Type 2 diabetes mellitus with diabetic chronic kidney disease: Secondary | ICD-10-CM | POA: Diagnosis not present

## 2023-09-05 ENCOUNTER — Other Ambulatory Visit: Payer: Self-pay | Admitting: Urology

## 2023-09-05 ENCOUNTER — Encounter: Payer: Self-pay | Admitting: Urology

## 2023-09-05 ENCOUNTER — Ambulatory Visit: Admitting: Urology

## 2023-09-05 VITALS — BP 120/67 | HR 85 | Ht 72.0 in | Wt >= 6400 oz

## 2023-09-05 DIAGNOSIS — C642 Malignant neoplasm of left kidney, except renal pelvis: Secondary | ICD-10-CM

## 2023-09-05 DIAGNOSIS — Z87440 Personal history of urinary (tract) infections: Secondary | ICD-10-CM

## 2023-09-05 DIAGNOSIS — N3281 Overactive bladder: Secondary | ICD-10-CM

## 2023-09-05 DIAGNOSIS — Z85528 Personal history of other malignant neoplasm of kidney: Secondary | ICD-10-CM

## 2023-09-05 DIAGNOSIS — N138 Other obstructive and reflux uropathy: Secondary | ICD-10-CM

## 2023-09-05 DIAGNOSIS — N401 Enlarged prostate with lower urinary tract symptoms: Secondary | ICD-10-CM

## 2023-09-05 LAB — URINALYSIS, ROUTINE W REFLEX MICROSCOPIC
Bilirubin, UA: NEGATIVE
Ketones, UA: NEGATIVE
Leukocytes,UA: NEGATIVE
Nitrite, UA: NEGATIVE
RBC, UA: NEGATIVE
Specific Gravity, UA: 1.015 (ref 1.005–1.030)
Urobilinogen, Ur: 0.2 mg/dL (ref 0.2–1.0)
pH, UA: 5.5 (ref 5.0–7.5)

## 2023-09-05 LAB — MICROSCOPIC EXAMINATION

## 2023-09-05 MED ORDER — GEMTESA 75 MG PO TABS: 75.0000 mg | ORAL_TABLET | Freq: Every day | ORAL | 11 refills | Status: DC

## 2023-09-05 MED ORDER — GEMTESA 75 MG PO TABS: 75.0000 mg | ORAL_TABLET | Freq: Every day | ORAL | 3 refills | Status: DC

## 2023-09-05 NOTE — Progress Notes (Signed)
 Assessment: 1. BPH with obstruction/lower urinary tract symptoms   2. OAB (overactive bladder)   3. Renal cell carcinoma of left kidney (HCC); s/p perc ablation 10/24; salvage XRT 4/25   4. History of UTI     Plan: Continue alfuzosin  10 mg daily. Continue Gemtesa  75 mg daily.  Samples and rx provided. Return to office in 3 months He is scheduled to follow-up at [facility] for his renal cell carcinoma next week.  Chief Complaint:  Chief Complaint  Patient presents with   Benign Prostatic Hypertrophy    History of Present Illness:  Jesse [name] is a 73 y.o. male who is seen for continued evaluation of BPH with lower urinary tract symptoms, OAB symptoms, and history of left renal cell carcinoma.  He has a significant urologic history and was previously seen by Dr. Nieves at [facility]. He was diagnosed with a left renal mass, initially found in December 2023 on CT imaging.  CT from September 24, 2022 showed a 4.3 cm left renal mass with possible early venous invasion.  Management complicated by a large ventral abdominal wall hernia and chronic kidney disease with a GFR of 38.  He also has COPD and is on home O2.  He was referred to [facility] for management of the left renal mass.  He underwent percutaneous cryoablation on 12/11/2022.  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  Follow-up imaging with CT scan on 03/29/2023 showed a masslike enhancement along the lower aspect of the ablation bed.  He subsequently underwent treatment of recurrent disease with radiation therapy, which he completed in April 2025.  He has a long history of lower urinary tract symptoms with frequency, urgency, and weak stream.  He has been managed with alfuzosin . At his initial visit in June 2025, he reported symptoms of frequency, urgency, occasional urge incontinence.  He was voiding 10 times per day and 1-2 times per night.  He reported a good stream.  He was not having any dysuria or gross  hematuria.  He reported possible episodes of chronic prostatitis.  He feels the need to have a bowel movement in association with his urinary symptoms.  He was previously treated with a 42-day course of an antibiotic without significant improvement. IPSS = 18/4.  He has a prior history of UTIs.   Urine culture from 06/17/2023 grew 50-100 K Serratia. Urine culture from 07/02/2023 showed no growth.  He has a history of erectile dysfunction.  He is on Imdur .  He has a history of nephrolithiasis requiring ureteroscopy in the past.  He was given a trial of Gemtesa  75 mg daily for his OAB symptoms.  He returns today for follow-up.  He reports improvement in his urinary symptoms with the addition of Gemtesa .  He has noted decreased frequency and urgency.  No side effects. IPSS = 17/2.  Portions of the above documentation were copied from a prior visit for review purposes only.   Past Medical History:  Past Medical History:  Diagnosis Date   Alcohol use disorder in remission 1999   Anemia    iron deficiency   Anxiety and depression    Arthritis    ankles, knees and hands   At high risk for falls    Basal cell carcinoma    x4 (on both shoulders, one on forehead, one on nose)   CKD stage 3b, GFR 30-44 ml/min (HCC)    Former cigarette smoker 1999   Generalized anxiety disorder    Hard of  hearing    Headache    History of kidney stones    Hyperlipidemia due to type 2 diabetes mellitus (HCC)    Hypertension    Morbid obesity (HCC)    Neuropathy    O2 dependent    2L   Presence of permanent cardiac pacemaker    Renal cell carcinoma of left kidney (HCC)    pt had cryotherapy for this at [facility] 12/11/22)   Risk for falls    Sleep apnea    uses CPAP   TIA (transient ischemic attack)    pt states this was not confirmed due to MRI unable to be performed   Type 2 diabetes mellitus (HCC)    Ventral hernia with bowel obstruction    Per Hospital Encounter on 05/21/22    Past Surgical  History:  Past Surgical History:  Procedure Laterality Date   APPENDECTOMY  1980   BREAST SURGERY Left    removed part of breast due to gynecomastia   COLOSTOMY  1989   and reversal same year   COLOSTOMY  2017   FINGER ARTHRODESIS Right 02/04/2023   Procedure: RIGHT INDEX FINGER PROXIMAL INTERPHALANGEAL JOINT ARTHRODESIS;  Surgeon: [name], MD;  Location: [facility] OR;  Service: Orthopedics;  Laterality: Right;   IR THORACENTESIS ASP PLEURAL SPACE W/IMG GUIDE  05/18/2021   IR THORACENTESIS ASP PLEURAL SPACE W/IMG GUIDE  10/06/2021   KNEE SURGERY Right    NASAL SEPTUM SURGERY  1982   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   Procedure: PACEMAKER IMPLANT;  Surgeon: [name], MD;  Location: [facility] INVASIVE CV LAB;  Service: Cardiovascular;  Laterality: N/A;   RENAL CRYOABLATION  12/11/2022   VARICOCELECTOMY  1983    Allergies:  Allergies  Allergen Reactions   Tape Rash    If left on for a long time    Family History:  Family History  Problem Relation Age of Onset   Diabetes Brother    Bipolar disorder Daughter    Anxiety disorder Son     Social History:  Social History   Tobacco Use   Smoking status: Former    Current packs/day: 0.00    Types: Cigarettes    Quit date: 1999    Years since quitting: 26.5   Smokeless tobacco: Never  Vaping Use   Vaping status: Never Used  Substance Use Topics   Alcohol use: Not Currently    Comment: no alcohol since 1999   Drug use: Never    ROS: Constitutional:  Negative for fever, chills, weight loss CV: Negative for chest pain, previous MI, hypertension Respiratory:  Negative for shortness of breath, wheezing, sleep apnea, frequent cough GI:  Negative for nausea, vomiting, bloody stool, GERD  Physical exam: BP 120/67   Pulse 85   Ht 6' (1.829 m)   Wt (!) 400 lb (181.4 kg)   BMI 54.25 kg/m  GENERAL APPEARANCE:  Well appearing, well developed, well nourished, NAD HEENT:  Atraumatic, normocephalic, oropharynx clear NECK:   Supple without lymphadenopathy or thyromegaly ABDOMEN:  Soft, non-tender, no masses EXTREMITIES:  Moves all extremities well, without clubbing, cyanosis, or edema NEUROLOGIC:  Alert and oriented x 3, in wheelchair, CN II-XII grossly intact MENTAL STATUS:  appropriate BACK:  Non-tender to palpation, No CVAT SKIN:  Warm, dry, and intact   Results: U/A: 0-5 WBCs, 0-2 RBCs

## 2023-09-05 NOTE — Telephone Encounter (Signed)
 2nd request received. Will route to the surgeons office the preop clearance.

## 2023-09-06 ENCOUNTER — Other Ambulatory Visit: Payer: Self-pay | Admitting: Adult Health

## 2023-09-06 DIAGNOSIS — K219 Gastro-esophageal reflux disease without esophagitis: Secondary | ICD-10-CM

## 2023-09-06 MED ORDER — PANTOPRAZOLE SODIUM 40 MG PO TBEC: 40.0000 mg | DELAYED_RELEASE_TABLET | Freq: Two times a day (BID) | ORAL | Status: DC

## 2023-09-06 MED ORDER — PANTOPRAZOLE SODIUM 40 MG PO TBEC: 40.0000 mg | DELAYED_RELEASE_TABLET | Freq: Two times a day (BID) | ORAL | 1 refills | Status: DC

## 2023-09-06 NOTE — Telephone Encounter (Signed)
 Changed Protonix  to BID, sent eRx to pharmacy.

## 2023-09-06 NOTE — Telephone Encounter (Signed)
 Please advise as protonix  is listed as once daily on active medication list and patient is request for two times daily   It appears that on 08/15/23 a refill request came over,  populated a high risk warning, and Harlene An, NP changed rx to once daily versus twice daily

## 2023-09-09 ENCOUNTER — Other Ambulatory Visit: Payer: Self-pay | Admitting: Family

## 2023-09-09 DIAGNOSIS — R918 Other nonspecific abnormal finding of lung field: Secondary | ICD-10-CM | POA: Diagnosis not present

## 2023-09-09 DIAGNOSIS — C642 Malignant neoplasm of left kidney, except renal pelvis: Secondary | ICD-10-CM | POA: Diagnosis not present

## 2023-09-09 DIAGNOSIS — L600 Ingrowing nail: Secondary | ICD-10-CM | POA: Diagnosis not present

## 2023-09-09 DIAGNOSIS — I5022 Chronic systolic (congestive) heart failure: Secondary | ICD-10-CM | POA: Diagnosis not present

## 2023-09-09 DIAGNOSIS — I83893 Varicose veins of bilateral lower extremities with other complications: Secondary | ICD-10-CM | POA: Diagnosis not present

## 2023-09-09 DIAGNOSIS — I130 Hypertensive heart and chronic kidney disease with heart failure and stage 1 through stage 4 chronic kidney disease, or unspecified chronic kidney disease: Secondary | ICD-10-CM | POA: Diagnosis not present

## 2023-09-09 DIAGNOSIS — M79671 Pain in right foot: Secondary | ICD-10-CM | POA: Diagnosis not present

## 2023-09-09 DIAGNOSIS — B351 Tinea unguium: Secondary | ICD-10-CM | POA: Diagnosis not present

## 2023-09-09 DIAGNOSIS — J90 Pleural effusion, not elsewhere classified: Secondary | ICD-10-CM | POA: Diagnosis not present

## 2023-09-09 DIAGNOSIS — M2042 Other hammer toe(s) (acquired), left foot: Secondary | ICD-10-CM | POA: Diagnosis not present

## 2023-09-09 DIAGNOSIS — N2889 Other specified disorders of kidney and ureter: Secondary | ICD-10-CM | POA: Diagnosis not present

## 2023-09-09 DIAGNOSIS — E1140 Type 2 diabetes mellitus with diabetic neuropathy, unspecified: Secondary | ICD-10-CM | POA: Diagnosis not present

## 2023-09-09 DIAGNOSIS — M2041 Other hammer toe(s) (acquired), right foot: Secondary | ICD-10-CM | POA: Diagnosis not present

## 2023-09-09 DIAGNOSIS — D631 Anemia in chronic kidney disease: Secondary | ICD-10-CM | POA: Diagnosis not present

## 2023-09-09 DIAGNOSIS — N183 Chronic kidney disease, stage 3 unspecified: Secondary | ICD-10-CM | POA: Diagnosis not present

## 2023-09-09 DIAGNOSIS — M79672 Pain in left foot: Secondary | ICD-10-CM | POA: Diagnosis not present

## 2023-09-09 DIAGNOSIS — J9811 Atelectasis: Secondary | ICD-10-CM | POA: Diagnosis not present

## 2023-09-09 DIAGNOSIS — M21541 Acquired clubfoot, right foot: Secondary | ICD-10-CM | POA: Diagnosis not present

## 2023-09-09 NOTE — Telephone Encounter (Signed)
 High risk warning

## 2023-09-10 DIAGNOSIS — L570 Actinic keratosis: Secondary | ICD-10-CM | POA: Diagnosis not present

## 2023-09-10 DIAGNOSIS — L219 Seborrheic dermatitis, unspecified: Secondary | ICD-10-CM | POA: Diagnosis not present

## 2023-09-10 DIAGNOSIS — L308 Other specified dermatitis: Secondary | ICD-10-CM | POA: Diagnosis not present

## 2023-09-12 DIAGNOSIS — Z933 Colostomy status: Secondary | ICD-10-CM | POA: Diagnosis not present

## 2023-09-12 DIAGNOSIS — E119 Type 2 diabetes mellitus without complications: Secondary | ICD-10-CM | POA: Diagnosis not present

## 2023-09-12 DIAGNOSIS — K56609 Unspecified intestinal obstruction, unspecified as to partial versus complete obstruction: Secondary | ICD-10-CM | POA: Diagnosis not present

## 2023-09-12 DIAGNOSIS — E118 Type 2 diabetes mellitus with unspecified complications: Secondary | ICD-10-CM | POA: Diagnosis not present

## 2023-09-12 DIAGNOSIS — Z794 Long term (current) use of insulin: Secondary | ICD-10-CM | POA: Diagnosis not present

## 2023-09-13 ENCOUNTER — Ambulatory Visit: Payer: Medicare PPO

## 2023-09-13 ENCOUNTER — Other Ambulatory Visit: Payer: Self-pay | Admitting: Family

## 2023-09-13 DIAGNOSIS — I130 Hypertensive heart and chronic kidney disease with heart failure and stage 1 through stage 4 chronic kidney disease, or unspecified chronic kidney disease: Secondary | ICD-10-CM | POA: Diagnosis not present

## 2023-09-13 DIAGNOSIS — I442 Atrioventricular block, complete: Secondary | ICD-10-CM | POA: Diagnosis not present

## 2023-09-13 DIAGNOSIS — B029 Zoster without complications: Secondary | ICD-10-CM | POA: Diagnosis not present

## 2023-09-13 DIAGNOSIS — E1169 Type 2 diabetes mellitus with other specified complication: Secondary | ICD-10-CM | POA: Diagnosis not present

## 2023-09-13 DIAGNOSIS — N1832 Chronic kidney disease, stage 3b: Secondary | ICD-10-CM | POA: Diagnosis not present

## 2023-09-13 DIAGNOSIS — E1122 Type 2 diabetes mellitus with diabetic chronic kidney disease: Secondary | ICD-10-CM | POA: Diagnosis not present

## 2023-09-13 DIAGNOSIS — I509 Heart failure, unspecified: Secondary | ICD-10-CM | POA: Diagnosis not present

## 2023-09-13 DIAGNOSIS — M25511 Pain in right shoulder: Secondary | ICD-10-CM | POA: Diagnosis not present

## 2023-09-13 DIAGNOSIS — D631 Anemia in chronic kidney disease: Secondary | ICD-10-CM | POA: Diagnosis not present

## 2023-09-13 DIAGNOSIS — M542 Cervicalgia: Secondary | ICD-10-CM | POA: Diagnosis not present

## 2023-09-13 LAB — CUP PACEART REMOTE DEVICE CHECK
Battery Remaining Longevity: 74 mo
Battery Remaining Percentage: 78 %
Battery Voltage: 2.99 V
Brady Statistic AP VP Percent: 50 %
Brady Statistic AP VS Percent: 1 %
Brady Statistic AS VP Percent: 50 %
Brady Statistic AS VS Percent: 1 %
Brady Statistic RA Percent Paced: 45 %
Brady Statistic RV Percent Paced: 99 %
Date Time Interrogation Session: 20250725020029
Implantable Lead Connection Status: 753985
Implantable Lead Connection Status: 753985
Implantable Lead Implant Date: 20230728
Implantable Lead Implant Date: 20230728
Implantable Lead Location: 753859
Implantable Lead Location: 753860
Implantable Lead Model: 3830
Implantable Pulse Generator Implant Date: 20230728
Lead Channel Impedance Value: 430 Ohm
Lead Channel Impedance Value: 510 Ohm
Lead Channel Pacing Threshold Amplitude: 0.625 V
Lead Channel Pacing Threshold Amplitude: 0.75 V
Lead Channel Pacing Threshold Pulse Width: 0.5 ms
Lead Channel Pacing Threshold Pulse Width: 0.5 ms
Lead Channel Sensing Intrinsic Amplitude: 12 mV
Lead Channel Sensing Intrinsic Amplitude: 3.5 mV
Lead Channel Setting Pacing Amplitude: 1.625
Lead Channel Setting Pacing Amplitude: 2.5 V
Lead Channel Setting Pacing Pulse Width: 0.5 ms
Lead Channel Setting Sensing Sensitivity: 2.5 mV
Pulse Gen Model: 2272
Pulse Gen Serial Number: 8101667

## 2023-09-13 NOTE — Telephone Encounter (Signed)
 Pharmacy requested refill.  Pended Rx and sent to [facility] for approval due to HIGH ALERT Warning.

## 2023-09-16 ENCOUNTER — Other Ambulatory Visit: Payer: Self-pay

## 2023-09-16 ENCOUNTER — Encounter: Payer: Self-pay | Admitting: Family

## 2023-09-16 DIAGNOSIS — R5382 Chronic fatigue, unspecified: Secondary | ICD-10-CM

## 2023-09-16 NOTE — Patient Instructions (Signed)
 Visit Information  Thank you for taking time to visit with me today. Please don't hesitate to contact me if I can be of assistance to you before our next scheduled appointment.  Your next care management appointment is by telephone on Thursday, 10/17/23 at 2:00 PM  Please call the care guide team at [phone] if you need to cancel, schedule, or reschedule an appointment.   Please call [phone]-TALK (toll free, 24 hour hotline) if you are experiencing a Mental Health or Behavioral Health Crisis or need someone to talk to.  Clayborne Ly RN BSN CCM [place]  [facility] Nurse Care Coordinator  Direct Dial: [phone] Website: [email or web address]

## 2023-09-16 NOTE — Patient Outreach (Signed)
 Complex Care Management   Visit Note  09/16/2023  Name:  Jesse [name] MRN: [id number] DOB: 21-Jun-1950  Situation: Referral received for Complex Care Management related to  Diabetes with Complications, Chronic Kidney Disease, Atrial Fibrillation, and Benign Hypertension, Chronic Congestive Heart Failure, Renal Cell Carcinoma, Colostomy, Overactive Bladder, Iron Deficiency Anemia, Fatigue. I obtained verbal consent from Patient.  Visit completed with patient on the phone.  Background:   Past Medical History:  Diagnosis Date   Alcohol use disorder in remission 1999   Anemia    iron deficiency   Anxiety and depression    Arthritis    ankles, knees and hands   At high risk for falls    Basal cell carcinoma    x4 (on both shoulders, one on forehead, one on nose)   CKD stage 3b, GFR 30-44 ml/min (HCC)    Former cigarette smoker 1999   Generalized anxiety disorder    Hard of hearing    Headache    History of kidney stones    Hyperlipidemia due to type 2 diabetes mellitus (HCC)    Hypertension    Morbid obesity (HCC)    Neuropathy    O2 dependent    2L   Presence of permanent cardiac pacemaker    Renal cell carcinoma of left kidney (HCC)    pt had cryotherapy for this at [facility] 12/11/22)   Risk for falls    Sleep apnea    uses CPAP   TIA (transient ischemic attack)    pt states this was not confirmed due to MRI unable to be performed   Type 2 diabetes mellitus (HCC)    Ventral hernia with bowel obstruction    Per Hospital Encounter on 05/21/22    Assessment: Patient Reported Symptoms:  Cognitive Cognitive Status: Alert and oriented to person, place, and time Cognitive/Intellectual Conditions Management [RPT]: None reported or documented in medical history or problem list   Health Maintenance Behaviors: Annual physical exam, Healthy diet Health Facilitated by: Healthy diet, Rest  Neurological Neurological Review of Symptoms: No symptoms reported    HEENT HEENT Symptoms  Reported: Mouth or teeth pain HEENT Management Strategies: Medication therapy, Routine screening HEENT Self-Management Outcome: 4 (good) HEENT Comment: patient reported having a tooth extracted last week, reports he is healing without complications    Cardiovascular Cardiovascular Symptoms Reported: Fatigue Does patient have uncontrolled Hypertension?: No Cardiovascular Management Strategies: Adequate rest, Diet modification, Routine screening, Medication therapy Cardiovascular Self-Management Outcome: 3 (uncertain)  Respiratory Respiratory Symptoms Reported: No symptoms reported    Endocrine Endocrine Symptoms Reported: No symptoms reported Is patient diabetic?: Yes Is patient checking blood sugars at home?: Yes List most recent blood sugar readings, include date and time of day: FBS 147 Endocrine Self-Management Outcome: 4 (good)  Gastrointestinal Gastrointestinal Symptoms Reported: No symptoms reported Gastrointestinal Management Strategies: Colostomy Gastrointestinal Self-Management Outcome: 4 (good)    Genitourinary Genitourinary Symptoms Reported: Frequency, Urgency Genitourinary Management Strategies: Medication therapy (Routine Screening) Genitourinary Self-Management Outcome: 4 (good)  Integumentary Integumentary Symptoms Reported: Other Other Integumentary Symptoms: s/p Shingles outbreak, using steroid cream for scabbed areas Additional Integumentary Details: Cancer Screening clear Skin Management Strategies: Routine screening, Medication therapy Skin Self-Management Outcome: 4 (good)  Musculoskeletal Musculoskelatal Symptoms Reviewed: Limited mobility, Difficulty walking Musculoskeletal Management Strategies: Exercise, Routine screening, Medical device Musculoskeletal Self-Management Outcome: 4 (good) Musculoskeletal Comment: patient just completed PT with good results      Psychosocial Psychosocial Symptoms Reported: No symptoms reported   Major  Change/Loss/Stressor/Fears (CP): Medical condition, self  Quality of Family Relationships: helpful, involved, supportive Do you feel physically threatened by others?: No      07/12/2023    3:06 PM  Depression screen PHQ 2/9  Decreased Interest 0  Down, Depressed, Hopeless 2  PHQ - 2 Score 2  Altered sleeping 1  Tired, decreased energy 1  Change in appetite 0  Feeling bad or failure about yourself  0  Trouble concentrating 0  Moving slowly or fidgety/restless 0  Suicidal thoughts 0  PHQ-9 Score 4  Difficult doing work/chores Not difficult at all    There were no vitals filed for this visit.  Medications Reviewed Today     Reviewed by Morgan [name], RN (Registered Nurse) on 09/16/23 at 1438  Med List Status: <None>   Medication Order Taking? Sig Documenting Provider Last Dose Status Informant  acetaminophen  (TYLENOL ) 325 MG tablet [id number]  Take 2 tablets (650 mg total) by mouth every 6 (six) hours. [name], MD  Active   alfuzosin  (UROXATRAL ) 10 MG 24 hr tablet [id number]  TAKE 1 TABLET BY MOUTH EVERY DAY Ngetich, [name] C, NP  Active   ALPRAZolam  (XANAX ) 0.5 MG tablet [id number]  TAKE 1 TABLET(0.5 MG) BY MOUTH TWICE DAILY AS NEEDED FOR ANXIETY OR SLEEP Eubanks, Jessica K, NP  Active   apixaban  (ELIQUIS ) 5 MG TABS tablet [id number]  TAKE 1 TABLET(5 MG) BY MOUTH TWICE DAILY [name], MD  Active   Cholecalciferol (VITAMIN D3 SUPER STRENGTH) 50 MCG (2000 UT) TABS [id number]  Take 2,000 Units by mouth daily. [provider]  Active   Continuous Glucose Sensor (DEXCOM G7 SENSOR) MISC [id number]  APPLY 1 SENSOR TO SKIN EVERY 10 DAYS AS DIRECTED Shamleffer, Ibtehal Jaralla, MD  Active   Cyanocobalamin  (B-12 PO) [id number]  Take 1 tablet by mouth daily. [provider]  Active Self  diclofenac  Sodium (VOLTAREN ) 1 % GEL [id number]  Apply 2 g topically 4 (four) times daily. Apply to your right neck and shoulder area 3-4 times a day as needed Ngetich, [name] C, NP   Active Self  DULoxetine  (CYMBALTA ) 60 MG capsule [id number]  TAKE 1 CAPSULE(60 MG) BY MOUTH DAILY Ngetich, [name] C, NP  Active   Ferrous Sulfate  (IRON PO) [id number]  Take 2 tablets by mouth daily. [provider]  Active Self  HYDROcodone -acetaminophen  (NORCO/VICODIN) 5-325 MG tablet [id number]  Take 1 tablet by mouth at bedtime as needed for moderate pain (pain score 4-6). Medina-Vargas, Monina C, NP  Active   insulin  aspart (NOVOLOG ) 100 UNIT/ML injection [id number]  Max daily 100 units Shamleffer, Ibtehal Jaralla, MD  Active Self           Med Note JERALYN, FLORIDA A   Wed Jan 30, 2023  1:33 PM) Uses in insulin  pump  isosorbide  mononitrate (IMDUR ) 60 MG 24 hr tablet [id number]  TAKE 1 TABLET(60 MG) BY MOUTH DAILY Ngetich, [name] C, NP  Active Self  JARDIANCE  25 MG TABS tablet [id number]  Take 25 mg by mouth 2 (two) times daily. [provider]  Active   ketoconazole  (NIZORAL ) 2 % cream [id number]  APPLY TOPICALLY TO THE AFFECTED AREA AS NEEDED FOR IRRITATION Ngetich, [name] C, NP  Active Self  methocarbamol  (ROBAXIN ) 500 MG tablet [id number]  TAKE 1 TABLET(500 MG) BY MOUTH EVERY 8 HOURS AS NEEDED FOR MUSCLE SPASMS Ngetich, [name] C, NP  Active   metolazone  (ZAROXOLYN ) 2.5 MG tablet [id number]  Take 1 tablet (2.5 mg total) by mouth daily. Take 1-2 times a week as needed ,  spacing out 3 days between doses Ngetich, [name] C, NP  Active Self  ondansetron  (ZOFRAN ) 4 MG tablet [id number]  TAKE 1 TABLET(4 MG) BY MOUTH EVERY 8 HOURS AS NEEDED FOR NAUSEA OR VOMITING Ngetich, [name] C, NP  Active Self  OXYGEN  [id number]  Inhale 2 L into the lungs continuous. [provider]  Active Self  pantoprazole  (PROTONIX ) 40 MG tablet [id number]  Take 1 tablet (40 mg total) by mouth 2 (two) times daily. Medina-Vargas, Monina C, NP  Active   potassium chloride  SA (KLOR-CON  M) 20 MEQ tablet [id number]  Take 1 tablet (20 mEq total) by mouth 2 (two) times daily. Singh, Prashant K, MD  Active Self  Red Yeast Rice  600 MG CAPS [phone]  Take 1,200 mg by mouth daily. [provider]  Active Self  rOPINIRole  (REQUIP ) 2 MG tablet [id number]  TAKE 1 TABLET(2 MG) BY MOUTH AT BEDTIME Ngetich, [name] C, NP  Active Self  rosuvastatin  (CRESTOR ) 10 MG tablet [id number]  Take 2 tablets (20 mg total) by mouth daily. [name], MD  Active   tirzepatide  (MOUNJARO ) 7.5 MG/0.5ML Pen [id number]  Inject 7.5 mg into the skin once a week. Shamleffer, Donell Cardinal, MD  Active   tiZANidine  (ZANAFLEX ) 2 MG tablet [id number]  TAKE 1 TABLET BY MOUTH TWICE DAILY Ngetich, [name] C, NP  Active   topiramate  (TOPAMAX ) 25 MG tablet [id number]  TAKE 1 TABLET BY MOUTH EVERY DAY Ngetich, [name] C, NP  Active   torsemide (DEMADEX) 100 MG tablet [id number]  Take 100 mg by mouth daily. Patient takes 1/2 tablet in the morning and 1/2 tablet in the afternoon [provider]  Active   traMADol  (ULTRAM ) 50 MG tablet [id number]  TAKE 1 TABLET(50 MG) BY MOUTH TWICE DAILY AS NEEDED Ngetich, [name] C, NP  Active   traZODone  (DESYREL ) 50 MG tablet [id number]  TAKE 2 TABLETS(100 MG) BY MOUTH AT BEDTIME Ngetich, [name] C, NP  Active   triamcinolone  cream (KENALOG ) 0.1 % [id number]  Apply 1 Application topically as needed. [provider]  Active   Vibegron  (GEMTESA ) 75 MG TABS [id number]  Take 1 tablet (75 mg total) by mouth daily. Stoneking, Adine PARAS., MD  Active   Vitamin D , Ergocalciferol , (DRISDOL ) 1.25 MG (50000 UNIT) CAPS capsule [id number]  TAKE 1 CAPSULE BY MOUTH EVERY 7 DAYS Caro Harlene POUR, NP  Active   Med List Note (Horton, Jeannetta, CPhT 09/07/21 9091): Cpap 12.5 pressure            Recommendation:   PCP Follow-up with Roxan BROCKS Ngetich NP on 10/15/23 at 10:20 AM  Follow Up Plan:   Telephone follow up appointment date/time:  Thursday, August 28 at 2:00 PM  Clayborne Ly RN BSN CCM [place]  [facility], [facility] Health Nurse Care Coordinator  Direct Dial: [phone] Website:  [email or web address]

## 2023-09-17 ENCOUNTER — Ambulatory Visit (HOSPITAL_COMMUNITY): Admitting: Internal Medicine

## 2023-09-17 NOTE — Progress Notes (Signed)
 This encounter was created in error - please disregard.

## 2023-09-19 ENCOUNTER — Ambulatory Visit: Payer: Self-pay | Admitting: Cardiology

## 2023-09-19 DIAGNOSIS — I130 Hypertensive heart and chronic kidney disease with heart failure and stage 1 through stage 4 chronic kidney disease, or unspecified chronic kidney disease: Secondary | ICD-10-CM | POA: Diagnosis not present

## 2023-09-19 DIAGNOSIS — R600 Localized edema: Secondary | ICD-10-CM | POA: Diagnosis not present

## 2023-09-19 DIAGNOSIS — E876 Hypokalemia: Secondary | ICD-10-CM | POA: Diagnosis not present

## 2023-09-19 DIAGNOSIS — I509 Heart failure, unspecified: Secondary | ICD-10-CM | POA: Diagnosis not present

## 2023-09-19 DIAGNOSIS — N2581 Secondary hyperparathyroidism of renal origin: Secondary | ICD-10-CM | POA: Diagnosis not present

## 2023-09-19 DIAGNOSIS — N1832 Chronic kidney disease, stage 3b: Secondary | ICD-10-CM | POA: Diagnosis not present

## 2023-09-19 DIAGNOSIS — C642 Malignant neoplasm of left kidney, except renal pelvis: Secondary | ICD-10-CM | POA: Diagnosis not present

## 2023-09-19 DIAGNOSIS — E1122 Type 2 diabetes mellitus with diabetic chronic kidney disease: Secondary | ICD-10-CM | POA: Diagnosis not present

## 2023-09-19 DIAGNOSIS — D631 Anemia in chronic kidney disease: Secondary | ICD-10-CM | POA: Diagnosis not present

## 2023-09-25 ENCOUNTER — Other Ambulatory Visit (HOSPITAL_COMMUNITY): Payer: Self-pay | Admitting: Internal Medicine

## 2023-09-25 ENCOUNTER — Other Ambulatory Visit: Payer: Self-pay | Admitting: Family

## 2023-09-25 DIAGNOSIS — I4819 Other persistent atrial fibrillation: Secondary | ICD-10-CM

## 2023-09-25 DIAGNOSIS — G4733 Obstructive sleep apnea (adult) (pediatric): Secondary | ICD-10-CM | POA: Diagnosis not present

## 2023-09-25 NOTE — Telephone Encounter (Signed)
 Prescription refill request for Eliquis  received. Indication: AF Last office visit: 07/30/23  JINNY Heinrich PA-C Scr: 2.1 on 09/09/23 Age: 73 Weight: 181.4kg  Based on above findings Eliquis  5mg  twice daily is the appropriate dose.  Refill approved.

## 2023-09-26 DIAGNOSIS — Z794 Long term (current) use of insulin: Secondary | ICD-10-CM | POA: Diagnosis not present

## 2023-09-26 DIAGNOSIS — Z933 Colostomy status: Secondary | ICD-10-CM | POA: Diagnosis not present

## 2023-09-26 DIAGNOSIS — E119 Type 2 diabetes mellitus without complications: Secondary | ICD-10-CM | POA: Diagnosis not present

## 2023-09-26 DIAGNOSIS — K56609 Unspecified intestinal obstruction, unspecified as to partial versus complete obstruction: Secondary | ICD-10-CM | POA: Diagnosis not present

## 2023-09-26 DIAGNOSIS — E118 Type 2 diabetes mellitus with unspecified complications: Secondary | ICD-10-CM | POA: Diagnosis not present

## 2023-09-30 DIAGNOSIS — K56609 Unspecified intestinal obstruction, unspecified as to partial versus complete obstruction: Secondary | ICD-10-CM | POA: Diagnosis not present

## 2023-09-30 DIAGNOSIS — Z933 Colostomy status: Secondary | ICD-10-CM | POA: Diagnosis not present

## 2023-09-30 DIAGNOSIS — Z794 Long term (current) use of insulin: Secondary | ICD-10-CM | POA: Diagnosis not present

## 2023-09-30 DIAGNOSIS — E119 Type 2 diabetes mellitus without complications: Secondary | ICD-10-CM | POA: Diagnosis not present

## 2023-09-30 DIAGNOSIS — E118 Type 2 diabetes mellitus with unspecified complications: Secondary | ICD-10-CM | POA: Diagnosis not present

## 2023-10-01 DIAGNOSIS — G8929 Other chronic pain: Secondary | ICD-10-CM

## 2023-10-01 MED ORDER — HYDROCODONE-ACETAMINOPHEN 5-325 MG PO TABS: 1.0000 | ORAL_TABLET | Freq: Every evening | ORAL | 0 refills | Status: DC | PRN

## 2023-10-01 NOTE — Addendum Note (Signed)
 Addended byBETHA LEONARDA BURDOCK C on: 10/01/2023 05:51 PM   Modules accepted: Orders

## 2023-10-01 NOTE — Telephone Encounter (Signed)
 Patient has request for refill on Hydrocodone . Patient last refill was 08/23/2023 with temporary supply. Patient has contract on file dated 03/24/2023. Patient has upcoming appointment 10/15/2023.  Medication pend and sent to PCP (Ngetich, [name] C, NP) for approval.

## 2023-10-01 NOTE — Addendum Note (Signed)
 Addended by: Choua Ikner E on: 10/01/2023 11:55 AM   Modules accepted: Orders

## 2023-10-01 NOTE — Telephone Encounter (Signed)
 MyChart message sent to patient.

## 2023-10-14 NOTE — Telephone Encounter (Signed)
 error

## 2023-10-15 ENCOUNTER — Ambulatory Visit: Payer: Medicare PPO | Admitting: Family

## 2023-10-15 ENCOUNTER — Encounter: Payer: Self-pay | Admitting: Family

## 2023-10-15 VITALS — BP 118/66 | HR 69 | Temp 97.8°F | Resp 20

## 2023-10-15 DIAGNOSIS — R309 Painful micturition, unspecified: Secondary | ICD-10-CM

## 2023-10-15 DIAGNOSIS — J9611 Chronic respiratory failure with hypoxia: Secondary | ICD-10-CM

## 2023-10-15 DIAGNOSIS — R790 Abnormal level of blood mineral: Secondary | ICD-10-CM | POA: Diagnosis not present

## 2023-10-15 DIAGNOSIS — I129 Hypertensive chronic kidney disease with stage 1 through stage 4 chronic kidney disease, or unspecified chronic kidney disease: Secondary | ICD-10-CM

## 2023-10-15 DIAGNOSIS — K219 Gastro-esophageal reflux disease without esophagitis: Secondary | ICD-10-CM

## 2023-10-15 DIAGNOSIS — R350 Frequency of micturition: Secondary | ICD-10-CM

## 2023-10-15 DIAGNOSIS — G2581 Restless legs syndrome: Secondary | ICD-10-CM

## 2023-10-15 DIAGNOSIS — R300 Dysuria: Secondary | ICD-10-CM | POA: Diagnosis not present

## 2023-10-15 DIAGNOSIS — F411 Generalized anxiety disorder: Secondary | ICD-10-CM | POA: Diagnosis not present

## 2023-10-15 DIAGNOSIS — E1142 Type 2 diabetes mellitus with diabetic polyneuropathy: Secondary | ICD-10-CM | POA: Diagnosis not present

## 2023-10-15 DIAGNOSIS — I4819 Other persistent atrial fibrillation: Secondary | ICD-10-CM | POA: Diagnosis not present

## 2023-10-15 DIAGNOSIS — N183 Chronic kidney disease, stage 3 unspecified: Secondary | ICD-10-CM

## 2023-10-15 LAB — POCT URINALYSIS DIPSTICK
Bilirubin, UA: NEGATIVE
Glucose, UA: NEGATIVE
Ketones, UA: NEGATIVE
Nitrite, UA: POSITIVE
Protein, UA: POSITIVE — AB
Spec Grav, UA: 1.02 (ref 1.010–1.025)
Urobilinogen, UA: 0.2 U/dL
pH, UA: 5 (ref 5.0–8.0)

## 2023-10-15 MED ORDER — GUAIFENESIN ER 600 MG PO TB12: 600.0000 mg | ORAL_TABLET | Freq: Two times a day (BID) | ORAL | 3 refills | Status: DC

## 2023-10-16 ENCOUNTER — Other Ambulatory Visit: Payer: Self-pay | Admitting: Nurse Practitioner

## 2023-10-16 LAB — COMPREHENSIVE METABOLIC PANEL WITH GFR
AG Ratio: 1.5 (calc) (ref 1.0–2.5)
ALT: 10 U/L (ref 9–46)
AST: 13 U/L (ref 10–35)
Albumin: 4 g/dL (ref 3.6–5.1)
Alkaline phosphatase (APISO): 61 U/L (ref 35–144)
BUN/Creatinine Ratio: 14 (calc) (ref 6–22)
BUN: 25 mg/dL (ref 7–25)
CO2: 27 mmol/L (ref 20–32)
Calcium: 8.3 mg/dL — ABNORMAL LOW (ref 8.6–10.3)
Chloride: 104 mmol/L (ref 98–110)
Creat: 1.82 mg/dL — ABNORMAL HIGH (ref 0.70–1.28)
Globulin: 2.7 g/dL (ref 1.9–3.7)
Glucose, Bld: 172 mg/dL — ABNORMAL HIGH (ref 65–99)
Potassium: 4.6 mmol/L (ref 3.5–5.3)
Sodium: 138 mmol/L (ref 135–146)
Total Bilirubin: 0.4 mg/dL (ref 0.2–1.2)
Total Protein: 6.7 g/dL (ref 6.1–8.1)
eGFR: 39 mL/min/1.73m2 — ABNORMAL LOW (ref >=60)

## 2023-10-16 LAB — LIPID PANEL
Cholesterol: 130 mg/dL (ref <200)
HDL: 50 mg/dL (ref >=40)
LDL Cholesterol (Calc): 59 mg/dL
Non-HDL Cholesterol (Calc): 80 mg/dL (ref <130)
Total CHOL/HDL Ratio: 2.6 (calc) (ref <5.0)
Triglycerides: 124 mg/dL (ref <150)

## 2023-10-16 LAB — CBC WITH DIFFERENTIAL/PLATELET
Absolute Lymphocytes: 849 {cells}/uL — ABNORMAL LOW (ref 850–3900)
Absolute Monocytes: 676 {cells}/uL (ref 200–950)
Basophils Absolute: 41 {cells}/uL (ref 0–200)
Basophils Relative: 0.6 %
Eosinophils Absolute: 269 {cells}/uL (ref 15–500)
Eosinophils Relative: 3.9 %
HCT: 35.9 % — ABNORMAL LOW (ref 38.5–50.0)
Hemoglobin: 11.1 g/dL — ABNORMAL LOW (ref 13.2–17.1)
MCH: 30.3 pg (ref 27.0–33.0)
MCHC: 30.9 g/dL — ABNORMAL LOW (ref 32.0–36.0)
MCV: 98.1 fL (ref 80.0–100.0)
MPV: 8.8 fL (ref 7.5–12.5)
Monocytes Relative: 9.8 %
Neutro Abs: 5065 {cells}/uL (ref 1500–7800)
Neutrophils Relative %: 73.4 %
Platelets: 183 Thousand/uL (ref 140–400)
RBC: 3.66 Million/uL — ABNORMAL LOW (ref 4.20–5.80)
RDW: 14.8 % (ref 11.0–15.0)
Total Lymphocyte: 12.3 %
WBC: 6.9 Thousand/uL (ref 3.8–10.8)

## 2023-10-16 LAB — HEMOGLOBIN A1C
Hgb A1c MFr Bld: 7.2 % — ABNORMAL HIGH (ref <5.7)
Mean Plasma Glucose: 160 mg/dL
eAG (mmol/L): 8.9 mmol/L

## 2023-10-16 LAB — TSH: TSH: 1.9 m[IU]/L (ref 0.40–4.50)

## 2023-10-16 LAB — MAGNESIUM: Magnesium: 2.6 mg/dL — ABNORMAL HIGH (ref 1.5–2.5)

## 2023-10-16 NOTE — Telephone Encounter (Signed)
 Please advice is this a long term medication?

## 2023-10-17 ENCOUNTER — Other Ambulatory Visit: Payer: Self-pay

## 2023-10-17 DIAGNOSIS — I509 Heart failure, unspecified: Secondary | ICD-10-CM

## 2023-10-17 DIAGNOSIS — I480 Paroxysmal atrial fibrillation: Secondary | ICD-10-CM

## 2023-10-17 DIAGNOSIS — N1832 Chronic kidney disease, stage 3b: Secondary | ICD-10-CM

## 2023-10-17 LAB — URINE CULTURE
MICRO NUMBER:: 16885917
SPECIMEN QUALITY:: ADEQUATE

## 2023-10-17 NOTE — Patient Instructions (Signed)
 Visit Information  Thank you for taking time to visit with me today. Please don't hesitate to contact me if I can be of assistance to you before our next scheduled appointment.  Your next care management appointment is by telephone on Friday, September 26 at 2:30 PM  Please call the care guide team at [phone] if you need to cancel, schedule, or reschedule an appointment.   Please call [phone]-TALK (toll free, 24 hour hotline) if you are experiencing a Mental Health or Behavioral Health Crisis or need someone to talk to.  Clayborne Ly RN BSN CCM [place]  [facility], [facility] Health Nurse Care Coordinator  Direct Dial: [phone] Website: [email or web address]

## 2023-10-17 NOTE — Patient Outreach (Signed)
 Complex Care Management   Visit Note  10/17/2023  Name:  Jesse [name] MRN: [id number] DOB: 12-17-1950  Situation: Referral received for Complex Care Management related to Diabetes with Complications, Chronic Kidney Disease, Atrial Fibrillation, and Benign Hypertension, Chronic Congestive Heart Failure, Renal Cell Carcinoma, Colostomy, Overactive Bladder, Iron Deficiency Anemia, Fatigue, UTI.  I obtained verbal consent from Patient.  Visit completed with Patient on the phone.  Background:   Past Medical History:  Diagnosis Date   Alcohol use disorder in remission 1999   Anemia    iron deficiency   Anxiety and depression    Arthritis    ankles, knees and hands   At high risk for falls    Basal cell carcinoma    x4 (on both shoulders, one on forehead, one on nose)   CKD stage 3b, GFR 30-44 ml/min (HCC)    Former cigarette smoker 1999   Generalized anxiety disorder    Hard of hearing    Headache    History of kidney stones    Hyperlipidemia due to type 2 diabetes mellitus (HCC)    Hypertension    Morbid obesity (HCC)    Neuropathy    O2 dependent    2L   Presence of permanent cardiac pacemaker    Renal cell carcinoma of left kidney (HCC)    pt had cryotherapy for this at [facility] 12/11/22)   Risk for falls    Sleep apnea    uses CPAP   TIA (transient ischemic attack)    pt states this was not confirmed due to MRI unable to be performed   Type 2 diabetes mellitus (HCC)    Ventral hernia with bowel obstruction    Per Hospital Encounter on 05/21/22    Assessment: Patient Reported Symptoms:  Cognitive Cognitive Status: Alert and oriented to person, place, and time, Normal speech and language skills Cognitive/Intellectual Conditions Management [RPT]: None reported or documented in medical history or problem list   Health Maintenance Behaviors: Annual physical exam, Healthy diet Health Facilitated by: Healthy diet, Rest  Neurological Neurological Review of Symptoms: Not  assessed    HEENT HEENT Symptoms Reported: Other: (increased mucous production) HEENT Management Strategies: Medication therapy, Routine screening HEENT Self-Management Outcome: 4 (good) HEENT Comment: patient started Mucinex  600 mg bid as directed by PCP with good results    Cardiovascular Cardiovascular Symptoms Reported: Swelling in legs or feet Does patient have uncontrolled Hypertension?: No Cardiovascular Management Strategies: Medication therapy, Routine screening Cardiovascular Self-Management Outcome: 4 (good)  Respiratory Respiratory Symptoms Reported: No symptoms reported    Endocrine Endocrine Symptoms Reported: No symptoms reported Is patient diabetic?: Yes Is patient checking blood sugars at home?: Yes Endocrine Self-Management Outcome: 4 (good)  Gastrointestinal Gastrointestinal Symptoms Reported: No symptoms reported Gastrointestinal Management Strategies: Colostomy Gastrointestinal Self-Management Outcome: 4 (good)    Genitourinary Genitourinary Symptoms Reported: Urgency Genitourinary Management Strategies:  (UTI; Routine Screening) Genitourinary Self-Management Outcome: 3 (uncertain) Genitourinary Comment: patient is awaiting urine culture results in order to start the appropriate treatment  Integumentary Integumentary Symptoms Reported: Not assessed    Musculoskeletal Musculoskelatal Symptoms Reviewed: Not assessed        Psychosocial Psychosocial Symptoms Reported: Anxiety - if selected complete GAD Behavioral Management Strategies: Support system, Complementary therapy(ies), Coping strategies, Medication therapy Behavioral Health Self-Management Outcome: 3 (uncertain) Behavioral Health Comment: Referral to LCSW for counseling and support Major Change/Loss/Stressor/Fears (CP): Denies Quality of Family Relationships: involved, helpful, supportive Do you feel physically threatened by others?: No    10/17/2023  PHQ2-9 Depression Screening   Jesse [name] interest  or pleasure in doing things    Feeling down, depressed, or hopeless    PHQ-2 - Total Score    Trouble falling or staying asleep, or sleeping too much    Feeling tired or having Jesse [name] energy    Poor appetite or overeating     Feeling bad about yourself - or that you are a failure or have let yourself or your family down    Trouble concentrating on things, such as reading the newspaper or watching television    Moving or speaking so slowly that other people could have noticed.  Or the opposite - being so fidgety or restless that you have been moving around a lot more than usual    Thoughts that you would be better off dead, or hurting yourself in some way    PHQ2-9 Total Score    If you checked off any problems, how difficult have these problems made it for you to do your work, take care of things at home, or get along with other people    Depression Interventions/Treatment      There were no vitals filed for this visit.  Medications Reviewed Today     Reviewed by Jesse [name], RN (Registered Nurse) on 10/17/23 at 1715  Med List Status: <None>   Medication Order Taking? Sig Documenting Provider Last Dose Status Informant  acetaminophen  (TYLENOL ) 325 MG tablet [id number]  Take 2 tablets (650 mg total) by mouth every 6 (six) hours. Jesse Lenis, MD  Active   alfuzosin  (UROXATRAL ) 10 MG 24 hr tablet [id number]  TAKE 1 TABLET BY MOUTH EVERY DAY [name], Jesse C, NP  Active   ALPRAZolam  (XANAX ) 0.5 MG tablet [id number]  TAKE 1 TABLET(0.5 MG) BY MOUTH TWICE DAILY AS NEEDED FOR ANXIETY OR SLEEP [name], Jesse K, NP  Active   Cholecalciferol (VITAMIN D3 SUPER STRENGTH) 50 MCG (2000 UT) TABS [id number]  Take 2,000 Units by mouth daily. [provider]  Active   Continuous Glucose Sensor (DEXCOM G7 SENSOR) MISC [id number]  APPLY 1 SENSOR TO SKIN EVERY 10 DAYS AS DIRECTED [name], Jesse Jaralla, MD  Active   Cyanocobalamin  (B-12 PO) [id number]  Take 1 tablet by mouth daily. [provider]  Active Self  diclofenac  Sodium (VOLTAREN ) 1 % GEL [id number]  Apply 2 g topically 4 (four) times daily. Apply to your right neck and shoulder area 3-4 times a day as needed [name], Jesse C, NP  Active Self  DULoxetine  (CYMBALTA ) 60 MG capsule [id number]  TAKE 1 CAPSULE(60 MG) BY MOUTH DAILY [name], Jesse C, NP  Active   ELIQUIS  5 MG TABS tablet [id number]  TAKE 1 TABLET(5 MG) BY MOUTH TWICE DAILY [name], MD  Active   Ferrous Sulfate  (IRON PO) [id number]  Take 2 tablets by mouth daily. [provider]  Active Self  guaiFENesin  (MUCINEX ) 600 MG 12 hr tablet [id number]  Take 1 tablet (600 mg total) by mouth 2 (two) times daily. [name], Jesse C, NP  Active   HYDROcodone -acetaminophen  (NORCO/VICODIN) 5-325 MG tablet [id number]  Take 1 tablet by mouth at bedtime as needed for moderate pain (pain score 4-6). [name], Jesse C, NP  Active   insulin  aspart (NOVOLOG ) 100 UNIT/ML injection [id number]  Max daily 100 units [name], Jesse Jaralla, MD  Active Self           Med Note JERALYN DUNCANS A   Wed Jan 30, 2023  1:33 PM) Uses in insulin  pump  isosorbide  mononitrate (IMDUR ) 60 MG 24 hr tablet [id number]  TAKE 1 TABLET(60 MG) BY MOUTH DAILY [name], Jesse C, NP  Active   ketoconazole  (NIZORAL ) 2 % cream [id number]  APPLY TOPICALLY TO THE AFFECTED AREA AS NEEDED FOR IRRITATION [name], Jesse C, NP  Active Self  methocarbamol  (ROBAXIN ) 500 MG tablet [id number]  TAKE 1 TABLET(500 MG) BY MOUTH EVERY 8 HOURS AS NEEDED FOR MUSCLE SPASMS [name], Jesse C, NP  Active   metolazone  (ZAROXOLYN ) 2.5 MG tablet [id number]  Take 1 tablet (2.5 mg total) by mouth daily. Take 1-2 times a week as needed , spacing out 3 days between doses [name], Jesse C, NP  Active Self  ondansetron  (ZOFRAN ) 4 MG tablet [id number]  TAKE 1 TABLET(4 MG) BY MOUTH EVERY 8 HOURS AS NEEDED FOR NAUSEA OR VOMITING  Patient taking differently: as needed.   [name], Jesse C, NP  Active Self  OXYGEN  [id number]  Inhale 2 L into  the lungs continuous. [provider]  Active Self  pantoprazole  (PROTONIX ) 40 MG tablet [id number]  Take 1 tablet (40 mg total) by mouth 2 (two) times daily. Medina-Vargas, Monina C, NP  Active   potassium chloride  SA (KLOR-CON  M) 20 MEQ tablet [id number]  Take 1 tablet (20 mEq total) by mouth 2 (two) times daily. Singh, Prashant K, MD  Active Self  Red Yeast Rice 600 MG CAPS [phone]  Take 1,200 mg by mouth daily. [provider]  Active Self  rOPINIRole  (REQUIP ) 2 MG tablet [id number]  TAKE 1 TABLET(2 MG) BY MOUTH AT BEDTIME [name], Jesse C, NP  Active Self  rosuvastatin  (CRESTOR ) 10 MG tablet [id number]  Take 2 tablets (20 mg total) by mouth daily. [name], MD  Active   tirzepatide  (MOUNJARO ) 7.5 MG/0.5ML Pen [id number]  Inject 7.5 mg into the skin once a week. [name], Donell Cardinal, MD  Active   tiZANidine  (ZANAFLEX ) 2 MG tablet [id number]  TAKE 1 TABLET BY MOUTH TWICE DAILY [name], Jesse C, NP  Active   topiramate  (TOPAMAX ) 25 MG tablet [id number]  TAKE 1 TABLET BY MOUTH EVERY DAY [name], Jesse C, NP  Active   torsemide (DEMADEX) 100 MG tablet [id number]  Take 100 mg by mouth daily. Patient takes 1/2 tablet in the morning and 1/2 tablet in the afternoon [provider]  Active   traMADol  (ULTRAM ) 50 MG tablet [id number]  TAKE 1 TABLET(50 MG) BY MOUTH TWICE DAILY AS NEEDED  Patient taking differently: as needed. TAKE 1 TABLET(50 MG) BY MOUTH TWICE DAILY AS NEEDED   [name], Jesse C, NP  Active   traZODone  (DESYREL ) 50 MG tablet [id number]  TAKE 2 TABLETS(100 MG) BY MOUTH AT BEDTIME [name], Jesse C, NP  Active   triamcinolone  cream (KENALOG ) 0.1 % [id number]  Apply 1 Application topically as needed. [provider]  Active   Vibegron  (GEMTESA ) 75 MG TABS [id number]  Take 1 tablet (75 mg total) by mouth daily. Stoneking, Adine PARAS., MD  Active   Vitamin D , Ergocalciferol , (DRISDOL ) 1.25 MG (50000 UNIT) CAPS capsule [id number]  TAKE 1 CAPSULE BY MOUTH EVERY 7  DAYS [name], Jesse C, NP  Active   Med List Note (Horton, Jeannetta, CPhT 09/07/21 0908): Cpap 12.5 pressure            Recommendation:   Specialty provider follow-up with Autumn Millman, MD on 10/28/23 at 10:00 AM  Follow Up Plan:   Telephone follow up appointment date/time:  Friday, September 26 at 2:30 PM Referral to Licensed Clinical Social Worker  Clayborne Ly RN BSN CCM   [facility], [facility] Health Nurse Care Coordinator  Direct Dial: [phone] Website: [email or web address]

## 2023-10-18 ENCOUNTER — Ambulatory Visit: Payer: Self-pay | Admitting: Family

## 2023-10-18 ENCOUNTER — Telehealth: Payer: Self-pay | Admitting: *Deleted

## 2023-10-18 MED ORDER — CIPROFLOXACIN HCL 500 MG PO TABS: 500.0000 mg | ORAL_TABLET | Freq: Two times a day (BID) | ORAL | 0 refills | Status: DC

## 2023-10-18 MED ORDER — SACCHAROMYCES BOULARDII 250 MG PO CAPS: 250.0000 mg | ORAL_CAPSULE | Freq: Two times a day (BID) | ORAL | 0 refills | Status: DC

## 2023-10-18 NOTE — Telephone Encounter (Signed)
 Patient notified and agreed. Rx's sent to pharmacy as requested. Not taking any Magnesium supplements.   Pended Rx's and sent to [facility] for approval due to HIGH ALERT Warning.

## 2023-10-18 NOTE — Telephone Encounter (Signed)
 Ngetich, Roxan BROCKS, NP to [facility]  (Selected Message)    10/18/23  2:07 PM Result Note I final urine culture shows greater than 100,000 colonies of Klebsiella pneumonia.Start on Cipro  500 mg tablet one by mouth twice daily x 7 days Take along with probiotics Florastor 250 mg capsule one by mouth twice daily x 10 days to prevent antibiotics associated diarrhea  - Glucose is slightly high but hemoglobin A1c is stable compared to previous.  Continue current medication and dietary modification. -Magnesium level slightly high but has improved compared to previous taking any magnesium supplements?  Remona Boom reduce to once a day if taking. -Kidney function is stable. Calcium  is slightly low continue with multivitamin.

## 2023-10-20 ENCOUNTER — Encounter: Payer: Self-pay | Admitting: Oncology

## 2023-10-20 DIAGNOSIS — G2581 Restless legs syndrome: Secondary | ICD-10-CM | POA: Insufficient documentation

## 2023-10-20 DIAGNOSIS — E1142 Type 2 diabetes mellitus with diabetic polyneuropathy: Secondary | ICD-10-CM | POA: Insufficient documentation

## 2023-10-20 DIAGNOSIS — K219 Gastro-esophageal reflux disease without esophagitis: Secondary | ICD-10-CM | POA: Insufficient documentation

## 2023-10-20 NOTE — Progress Notes (Signed)
 Provider: Roxan Plough FNP-C   Hasel Janish, Roxan BROCKS, NP  Patient Care Team: Hasset Chaviano, Roxan BROCKS, NP as PCP - General (Family Medicine) [name], MD (Inactive) as PCP - Cardiology (Cardiology) [name], MD as PCP - Electrophysiology (Cardiology) [name], MD as Consulting Physician (Orthopedic Surgery) Morgan [name], RN as [facility] Care Management  Extended Emergency Contact Information Primary Emergency Contact: Turay,[name] Mobile Phone: [phone] Relation: Daughter Preferred language: English Interpreter needed? No Secondary Emergency Contact: [name],[name] Mobile Phone: [phone] Relation: Daughter  Code Status:  Full Code  Goals of care: Advanced Directive information    10/15/2023   10:19 AM  Advanced Directives  Does Patient Have a Medical Advance Directive? Yes  Type of Advance Directive Living will;Healthcare Power of Attorney  Does patient want to make changes to medical advance directive? No - Patient declined  Copy of Healthcare Power of Attorney in Chart? Yes - validated most recent copy scanned in chart (See row information)     Chief Complaint  Patient presents with   Medical Management of Chronic Issues    6 Month follow up.    Discussed the use of AI scribe software for clinical note transcription with the patient, who gave verbal consent to proceed.  History of Present Illness   Jesse [name] is a 73 year old male here for six months follow up. He presents with urinary urgency and dysuria for evaluation of a possible urinary tract infection.  He has been experiencing urinary urgency and a burning sensation during urination for the past two weeks. These symptoms differ from his usual overactive bladder symptoms, which have been managed with Gemesta for the past three months. The current symptoms are mild but persistent, with some associated pain. He recalls a previous experience with an incorrect antibiotic, emphasizing the  importance of waiting for culture results.  His blood sugars have been stable, with a reading of 147 mg/dL this morning. He uses an insulin  pump to regulate his blood sugar levels, although they can exceed 200 mg/dL postprandially. He is due for fasting blood work today.  He experiences thick, gooey mucus production, especially at night, requiring him to keep a cup by his bed to spit into. The mucus is very thick and difficult to expel, but it is not discolored. He drinks more than 1.5 liters of water daily, but less than 2 liters.  He experiences restless leg syndrome, particularly when trying to sleep, which can keep him awake for up to two hours. He uses a heating pad, applies Voltaren  cream to his knee, and takes ropinirole  30 minutes before bed. He inquires about the possibility of increasing his propranolol dosage.  He mentions a history of anemia with abnormal hematocrit levels, but his kidney function was not a concern according to a previous doctor. He has had recent dental work, including a tooth extraction, and cataract surgery in April. He plans to get a flu shot and COVID vaccine next week.    Past Medical History:  Diagnosis Date   Alcohol use disorder in remission 1999   Anemia    iron deficiency   Anxiety and depression    Arthritis    ankles, knees and hands   At high risk for falls    Basal cell carcinoma    x4 (on both shoulders, one on forehead, one on nose)   CKD stage 3b, GFR 30-44 ml/min (HCC)    Former cigarette smoker 1999   Generalized anxiety disorder  Hard of hearing    Headache    History of kidney stones    Hyperlipidemia due to type 2 diabetes mellitus (HCC)    Hypertension    Morbid obesity (HCC)    Neuropathy    O2 dependent    2L   Presence of permanent cardiac pacemaker    Renal cell carcinoma of left kidney (HCC)    pt had cryotherapy for this at [facility] 12/11/22)   Risk for falls    Sleep apnea    uses CPAP   TIA (transient ischemic attack)     pt states this was not confirmed due to MRI unable to be performed   Type 2 diabetes mellitus (HCC)    Ventral hernia with bowel obstruction    Per Hospital Encounter on 05/21/22   Past Surgical History:  Procedure Laterality Date   APPENDECTOMY  1980   BREAST SURGERY Left    removed part of breast due to gynecomastia   COLOSTOMY  1989   and reversal same year   COLOSTOMY  2017   FINGER ARTHRODESIS Right 02/04/2023   Procedure: RIGHT INDEX FINGER PROXIMAL INTERPHALANGEAL JOINT ARTHRODESIS;  Surgeon: [name], MD;  Location[facility] OR;  Service: Orthopedics;  Laterality: Right;   IR THORACENTESIS ASP PLEURAL SPACE W/IMG GUIDE  05/18/2021   IR THORACENTESIS ASP PLEURAL SPACE W/IMG GUIDE  10/06/2021   KNEE SURGERY Right    NASAL SEPTUM SURGERY  1982   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   Procedure: PACEMAKER IMPLANT;  Surgeon: [name], MD;  Location: [facility] INVASIVE CV LAB;  Service: Cardiovascular;  Laterality: N/A;   RENAL CRYOABLATION  12/11/2022   VARICOCELECTOMY  1983    Allergies  Allergen Reactions   Tape Rash    If left on for a long time    Allergies as of 10/15/2023       Reactions   Tape Rash   If left on for a long time        Medication List        Accurate as of October 15, 2023 11:59 PM. If you have any questions, ask your nurse or doctor.          STOP taking these medications    Jardiance  25 MG Tabs tablet Generic drug: empagliflozin  Stopped by: Tomara Youngberg C Harlea Goetzinger   penicillin v potassium 500 MG tablet Commonly known as: VEETID Stopped by: Phyllip Claw C Erabella Kuipers       TAKE these medications    acetaminophen  325 MG tablet Commonly known as: Tylenol  Take 2 tablets (650 mg total) by mouth every 6 (six) hours.   alfuzosin  10 MG 24 hr tablet Commonly known as: UROXATRAL  TAKE 1 TABLET BY MOUTH EVERY DAY   ALPRAZolam  0.5 MG tablet Commonly known as: XANAX  TAKE 1 TABLET(0.5 MG) BY MOUTH TWICE DAILY AS NEEDED FOR ANXIETY OR SLEEP   B-12  PO Take 1 tablet by mouth daily.   Dexcom G7 Sensor Misc APPLY 1 SENSOR TO SKIN EVERY 10 DAYS AS DIRECTED   diclofenac  Sodium 1 % Gel Commonly known as: VOLTAREN  Apply 2 g topically 4 (four) times daily. Apply to your right neck and shoulder area 3-4 times a day as needed   DULoxetine  60 MG capsule Commonly known as: CYMBALTA  TAKE 1 CAPSULE(60 MG) BY MOUTH DAILY   Eliquis  5 MG Tabs tablet Generic drug: apixaban  TAKE 1 TABLET(5 MG) BY MOUTH TWICE DAILY   Gemtesa  75 MG Tabs Generic drug: Vibegron  Take 1 tablet (75 mg  total) by mouth daily.   guaiFENesin  600 MG 12 hr tablet Commonly known as: Mucinex  Take 1 tablet (600 mg total) by mouth 2 (two) times daily. Started by: Chiante Peden C Aquilla Shambley   HYDROcodone -acetaminophen  5-325 MG tablet Commonly known as: NORCO/VICODIN Take 1 tablet by mouth at bedtime as needed for moderate pain (pain score 4-6).   insulin  aspart 100 UNIT/ML injection Commonly known as: novoLOG  Max daily 100 units   IRON PO Take 2 tablets by mouth daily.   isosorbide  mononitrate 60 MG 24 hr tablet Commonly known as: IMDUR  TAKE 1 TABLET(60 MG) BY MOUTH DAILY   ketoconazole  2 % cream Commonly known as: NIZORAL  APPLY TOPICALLY TO THE AFFECTED AREA AS NEEDED FOR IRRITATION   methocarbamol  500 MG tablet Commonly known as: ROBAXIN  TAKE 1 TABLET(500 MG) BY MOUTH EVERY 8 HOURS AS NEEDED FOR MUSCLE SPASMS   metolazone  2.5 MG tablet Commonly known as: ZAROXOLYN  Take 1 tablet (2.5 mg total) by mouth daily. Take 1-2 times a week as needed , spacing out 3 days between doses   ondansetron  4 MG tablet Commonly known as: ZOFRAN  TAKE 1 TABLET(4 MG) BY MOUTH EVERY 8 HOURS AS NEEDED FOR NAUSEA OR VOMITING What changed: See the new instructions.   OXYGEN  Inhale 2 L into the lungs continuous.   pantoprazole  40 MG tablet Commonly known as: PROTONIX  Take 1 tablet (40 mg total) by mouth 2 (two) times daily.   potassium chloride  SA 20 MEQ tablet Commonly known as:  KLOR-CON  M Take 1 tablet (20 mEq total) by mouth 2 (two) times daily.   Red Yeast Rice 600 MG Caps Take 1,200 mg by mouth daily.   rOPINIRole  2 MG tablet Commonly known as: REQUIP  TAKE 1 TABLET(2 MG) BY MOUTH AT BEDTIME   rosuvastatin  10 MG tablet Commonly known as: CRESTOR  Take 2 tablets (20 mg total) by mouth daily.   tirzepatide  7.5 MG/0.5ML Pen Commonly known as: MOUNJARO  Inject 7.5 mg into the skin once a week.   tiZANidine  2 MG tablet Commonly known as: ZANAFLEX  TAKE 1 TABLET BY MOUTH TWICE DAILY   topiramate  25 MG tablet Commonly known as: TOPAMAX  TAKE 1 TABLET BY MOUTH EVERY DAY   torsemide 100 MG tablet Commonly known as: DEMADEX Take 100 mg by mouth daily. Patient takes 1/2 tablet in the morning and 1/2 tablet in the afternoon   traMADol  50 MG tablet Commonly known as: ULTRAM  TAKE 1 TABLET(50 MG) BY MOUTH TWICE DAILY AS NEEDED What changed: See the new instructions.   traZODone  50 MG tablet Commonly known as: DESYREL  TAKE 2 TABLETS(100 MG) BY MOUTH AT BEDTIME   triamcinolone  cream 0.1 % Commonly known as: KENALOG  Apply 1 Application topically as needed.   Vitamin D  (Ergocalciferol ) 1.25 MG (50000 UNIT) Caps capsule Commonly known as: DRISDOL  TAKE 1 CAPSULE BY MOUTH EVERY 7 DAYS   Vitamin D3 Super Strength 50 MCG (2000 UT) Tabs Generic drug: Cholecalciferol Take 2,000 Units by mouth daily.        Review of Systems  Constitutional:  Negative for appetite change, chills, fatigue, fever and unexpected weight change.  HENT:  Negative for congestion, dental problem, ear discharge, ear pain, facial swelling, hearing loss, nosebleeds, postnasal drip, rhinorrhea, sinus pressure, sinus pain, sneezing, sore throat, tinnitus and trouble swallowing.   Eyes:  Negative for pain, discharge, redness, itching and visual disturbance.  Respiratory:  Negative for cough, chest tightness, shortness of breath and wheezing.   Cardiovascular:  Positive for leg swelling.  Negative for chest pain and  palpitations.  Gastrointestinal:  Negative for abdominal distention, abdominal pain, blood in stool, constipation, diarrhea, nausea and vomiting.       Ostomy bag draining   Endocrine: Negative for cold intolerance, heat intolerance, polydipsia, polyphagia and polyuria.  Genitourinary:  Negative for difficulty urinating, dysuria, flank pain, frequency and urgency.  Musculoskeletal:  Positive for arthralgias, back pain and gait problem. Negative for joint swelling, myalgias, neck pain and neck stiffness.  Skin:  Negative for color change, pallor, rash and wound.  Neurological:  Negative for dizziness, syncope, speech difficulty, weakness, light-headedness, numbness and headaches.  Hematological:  Does not bruise/bleed easily.  Psychiatric/Behavioral:  Negative for agitation, behavioral problems, confusion, hallucinations, self-injury, sleep disturbance and suicidal ideas. The patient is not nervous/anxious.     Immunization History  Administered Date(s) Administered    sv, Bivalent, Protein Subunit Rsvpref,pf [place]) 11/19/2021   Fluad Quad(high Dose 65+) 11/19/2020, 11/02/2021   H1N1 01/24/2008   INFLUENZA, HIGH DOSE SEASONAL PF 10/28/2022   Moderna Sars-Covid-2 Vaccination 04/03/2019, 05/05/2019, 12/20/2019, 05/23/2020   PNEUMOCOCCAL CONJUGATE-20 06/28/2022   Pfizer Covid-19 Vaccine Bivalent Booster 67yrs & up 12/11/2020   Pfizer(Comirnaty)Fall Seasonal Vaccine 12 years and older 11/19/2021   Pneumococcal Conjugate,unspecified 10/28/2022   Tdap 07/06/2022   Unspecified SARS-COV-2 Vaccination 10/28/2022   Zoster Recombinant(Shingrix) 03/01/2020, 05/23/2020   Pertinent  Health Maintenance Due  Topic Date Due   INFLUENZA VACCINE  12/15/2023 (Originally 09/20/2023)   HEMOGLOBIN A1C  04/16/2024   OPHTHALMOLOGY EXAM  07/03/2024   FOOT EXAM  10/01/2024   Colonoscopy  Discontinued      02/27/2023    2:18 PM 06/17/2023   10:06 AM 07/02/2023   11:15 AM 07/12/2023     4:06 PM 10/15/2023   10:17 AM  Fall Risk  Falls in the past year? 0 0 0 0 0  Was there an injury with Fall? 0 0 0 0 0  Fall Risk Category Calculator 0 0 0 0 0  Patient at Risk for Falls Due to No Fall Risks No Fall Risks No Fall Risks No Fall Risks No Fall Risks  Fall risk Follow up Falls evaluation completed Falls evaluation completed Falls prevention discussed;Falls evaluation completed Falls evaluation completed Falls evaluation completed   Functional Status Survey:    Vitals:   10/15/23 1024  BP: 118/66  Pulse: 69  Resp: 20  Temp: 97.8 F (36.6 C)  SpO2: 96%   There is no height or weight on file to calculate BMI. Physical Exam  VITALS: T- 97.8, P- 69, BP- 116/66, SaO2- 96% GENERAL: Alert, cooperative, well developed, no acute distress. HEENT: Normocephalic, normal oropharynx, moist mucous membranes, vision grossly intact, no sinus tenderness. CHEST: Clear to auscultation bilaterally, no wheezes, rhonchi, or crackles. CARDIOVASCULAR: Normal heart rate and rhythm, S1 and S2 normal without murmurs. ABDOMEN: Soft, non-tender, non-distended, without organomegaly, normal bowel sounds.LLQ colostomy bag pantleft lower quadrant hernia non-tender,soft and reducible.   EXTREMITIES: Edema present in right leg, no cyanosis. NEUROLOGICAL: Cranial nerves grossly intact, moves all extremities without gross motor or sensory deficit.  SKIN: No rash,no lesion or erythema   PSYCHIATRY/BEHAVIORAL: Mood stable   Labs reviewed: Recent Labs    02/04/23 0948 04/10/23 0000 05/15/23 0000 06/17/23 1100 10/15/23 1114  NA 132*   < > 141 139  139 138  K 3.0*   < > 4.2 4.4  4.4 4.6  CL 98   < > 99 103  103 104  CO2 23   < > 23* 28  28 27  GLUCOSE 196*  --   --  199*  199* 172*  BUN 35*   < > 30* 26*  26* 25  CREATININE 2.19*   < > 2.2* 2.08*  2.08* 1.82*  CALCIUM  8.4*   < > 9.0 8.8  8.8 8.3*  MG  --   --   --   --  2.6*   < > = values in this interval not displayed.   Recent  Labs    12/18/22 0000 04/10/23 0000 05/15/23 0000 06/17/23 1100 10/15/23 1114  AST  --   --   --  15 13  ALT  --   --   --  12 10  BILITOT  --   --   --  0.4 0.4  PROT  --   --   --  6.9 6.7  ALBUMIN 4.1 4.1 4.4  --   --    Recent Labs    02/04/23 0948 04/10/23 0000 05/15/23 0000 06/17/23 1100 10/15/23 1114  WBC 6.7   < > 6.3 6.3 6.9  NEUTROABS  --    < > 4.60 4,568 5,065  HGB 11.9*   < > 12.7* 12.5* 11.1*  HCT 37.8*   < > 40* 39.7 35.9*  MCV 88.3  --   --  94.3 98.1  PLT 223   < > 207 187 183   < > = values in this interval not displayed.   Lab Results  Component Value Date   TSH 1.90 10/15/2023   Lab Results  Component Value Date   HGBA1C 7.2 (H) 10/15/2023   Lab Results  Component Value Date   CHOL 130 10/15/2023   HDL 50 10/15/2023   LDLCALC 59 10/15/2023   TRIG 124 10/15/2023   CHOLHDL 2.6 10/15/2023    Significant Diagnostic Results in last 30 days:  No results found.  Assessment/Plan  Urine Frequency  Symptoms of urgency and burning during urination for the past two weeks. Urinalysis shows blood, protein, positive nitrites, and white blood cells, indicating a urinary tract infection. Awaiting culture results to determine appropriate antibiotic treatment. - Send urine for culture to identify appropriate antibiotic - Advise to report if symptoms worsen  Persistent Atrial Fibrillation  - continue on Eliquis   - continue to follow up with cardiologist   Type 2 diabetes mellitus with peripheral Neuropathy  Blood sugars are well-managed with occasional postprandial elevations. Morning blood sugar was 147 mg/dL. He is on insulin  pump therapy which regulates blood sugar levels effectively. - Perform fasting blood work including A1c  Chronic kidney disease Chronic kidney disease with previous abnormal hematocrit and kidney function tests. No current concerns from nephrologist regarding kidney function. - Include GFR in blood work to monitor kidney  function  Chronic lower extremity edema Chronic edema in lower extremities, currently more pronounced in the right leg.  Restless legs syndrome Experiencing significant restless legs syndrome symptoms at night, interfering with sleep. Currently using ropinirole  and Voltaren  cream for symptom management. Symptoms start when trying to sleep and can last up to two hours. - Check magnesium levels to rule out deficiency - Continue current medications and consider additional interventions if magnesium is low  Chronic Respiratory Failure with hypoxia  Experiencing thick, gooey mucus production, especially at night. No discoloration or wheezing noted. Likely related to desonide use which can cause dryness. - Recommend Mucinex  600 mg over-the-counter, one in the morning and one in the evening to loosen secretions  - continue on oxygen  2 Liters via  nasal cannula   Family/ staff Communication: Reviewed plan of care with patient  Labs/tests ordered:  - Urine Culture; Future - POC Urinalysis Dipstick -  CBC with Differential/Platelet - CMP with eGFR([facility]) - TSH - Hgb A1C - Lipid panel  Next Appointment : Return in about 6 months (around 04/16/2024) for medical mangement of chronic issues.SABRA   Spent 30 minutes of Face to face and non-face to face with patient  >50% time spent counseling; reviewing medical record; tests; labs; documentation and developing future plan of care.   Roxan JAYSON Plough, NP

## 2023-10-26 DIAGNOSIS — G4733 Obstructive sleep apnea (adult) (pediatric): Secondary | ICD-10-CM | POA: Diagnosis not present

## 2023-10-26 DIAGNOSIS — Z23 Encounter for immunization: Secondary | ICD-10-CM

## 2023-10-28 ENCOUNTER — Telehealth: Payer: Self-pay | Admitting: Oncology

## 2023-10-28 ENCOUNTER — Inpatient Hospital Stay

## 2023-10-28 ENCOUNTER — Telehealth: Payer: Self-pay | Admitting: Urology

## 2023-10-28 ENCOUNTER — Inpatient Hospital Stay: Attending: Oncology | Admitting: Oncology

## 2023-10-28 VITALS — BP 110/52 | HR 85 | Temp 97.9°F | Resp 18

## 2023-10-28 DIAGNOSIS — K439 Ventral hernia without obstruction or gangrene: Secondary | ICD-10-CM | POA: Insufficient documentation

## 2023-10-28 DIAGNOSIS — K7689 Other specified diseases of liver: Secondary | ICD-10-CM | POA: Diagnosis not present

## 2023-10-28 DIAGNOSIS — I700 Atherosclerosis of aorta: Secondary | ICD-10-CM | POA: Insufficient documentation

## 2023-10-28 DIAGNOSIS — D631 Anemia in chronic kidney disease: Secondary | ICD-10-CM | POA: Diagnosis not present

## 2023-10-28 DIAGNOSIS — J9811 Atelectasis: Secondary | ICD-10-CM | POA: Diagnosis not present

## 2023-10-28 DIAGNOSIS — E119 Type 2 diabetes mellitus without complications: Secondary | ICD-10-CM | POA: Diagnosis not present

## 2023-10-28 DIAGNOSIS — N390 Urinary tract infection, site not specified: Secondary | ICD-10-CM

## 2023-10-28 DIAGNOSIS — Z794 Long term (current) use of insulin: Secondary | ICD-10-CM | POA: Insufficient documentation

## 2023-10-28 DIAGNOSIS — Z85828 Personal history of other malignant neoplasm of skin: Secondary | ICD-10-CM | POA: Insufficient documentation

## 2023-10-28 DIAGNOSIS — I509 Heart failure, unspecified: Secondary | ICD-10-CM | POA: Diagnosis not present

## 2023-10-28 DIAGNOSIS — Z7985 Long-term (current) use of injectable non-insulin antidiabetic drugs: Secondary | ICD-10-CM | POA: Insufficient documentation

## 2023-10-28 DIAGNOSIS — G4730 Sleep apnea, unspecified: Secondary | ICD-10-CM | POA: Insufficient documentation

## 2023-10-28 DIAGNOSIS — Z933 Colostomy status: Secondary | ICD-10-CM | POA: Diagnosis not present

## 2023-10-28 DIAGNOSIS — C642 Malignant neoplasm of left kidney, except renal pelvis: Secondary | ICD-10-CM | POA: Diagnosis not present

## 2023-10-28 DIAGNOSIS — I129 Hypertensive chronic kidney disease with stage 1 through stage 4 chronic kidney disease, or unspecified chronic kidney disease: Secondary | ICD-10-CM | POA: Diagnosis not present

## 2023-10-28 DIAGNOSIS — Z79899 Other long term (current) drug therapy: Secondary | ICD-10-CM | POA: Insufficient documentation

## 2023-10-28 DIAGNOSIS — Z87891 Personal history of nicotine dependence: Secondary | ICD-10-CM | POA: Insufficient documentation

## 2023-10-28 DIAGNOSIS — D649 Anemia, unspecified: Secondary | ICD-10-CM

## 2023-10-28 DIAGNOSIS — E1140 Type 2 diabetes mellitus with diabetic neuropathy, unspecified: Secondary | ICD-10-CM | POA: Insufficient documentation

## 2023-10-28 DIAGNOSIS — N400 Enlarged prostate without lower urinary tract symptoms: Secondary | ICD-10-CM | POA: Diagnosis not present

## 2023-10-28 DIAGNOSIS — Z791 Long term (current) use of non-steroidal anti-inflammatories (NSAID): Secondary | ICD-10-CM | POA: Insufficient documentation

## 2023-10-28 DIAGNOSIS — Z87442 Personal history of urinary calculi: Secondary | ICD-10-CM | POA: Insufficient documentation

## 2023-10-28 DIAGNOSIS — E1122 Type 2 diabetes mellitus with diabetic chronic kidney disease: Secondary | ICD-10-CM | POA: Diagnosis not present

## 2023-10-28 DIAGNOSIS — N1832 Chronic kidney disease, stage 3b: Secondary | ICD-10-CM | POA: Insufficient documentation

## 2023-10-28 DIAGNOSIS — I4891 Unspecified atrial fibrillation: Secondary | ICD-10-CM | POA: Diagnosis not present

## 2023-10-28 DIAGNOSIS — D509 Iron deficiency anemia, unspecified: Secondary | ICD-10-CM | POA: Diagnosis not present

## 2023-10-28 DIAGNOSIS — Z7901 Long term (current) use of anticoagulants: Secondary | ICD-10-CM | POA: Insufficient documentation

## 2023-10-28 DIAGNOSIS — E785 Hyperlipidemia, unspecified: Secondary | ICD-10-CM | POA: Diagnosis not present

## 2023-10-28 LAB — CMP (CANCER CENTER ONLY)
ALT: 14 U/L (ref 0–44)
AST: 21 U/L (ref 15–41)
Albumin: 4.2 g/dL (ref 3.5–5.0)
Alkaline Phosphatase: 72 U/L (ref 38–126)
Anion gap: 14 (ref 5–15)
BUN: 36 mg/dL — ABNORMAL HIGH (ref 8–23)
CO2: 29 mmol/L (ref 22–32)
Calcium: 9.3 mg/dL (ref 8.9–10.3)
Chloride: 95 mmol/L — ABNORMAL LOW (ref 98–111)
Creatinine: 2.41 mg/dL — ABNORMAL HIGH (ref 0.61–1.24)
GFR, Estimated: 27 mL/min — ABNORMAL LOW (ref >60)
Glucose, Bld: 182 mg/dL — ABNORMAL HIGH (ref 70–99)
Potassium: 3.3 mmol/L — ABNORMAL LOW (ref 3.5–5.1)
Sodium: 138 mmol/L (ref 135–145)
Total Bilirubin: 0.5 mg/dL (ref 0.0–1.2)
Total Protein: 7.4 g/dL (ref 6.5–8.1)

## 2023-10-28 LAB — URINALYSIS, COMPLETE (UACMP) WITH MICROSCOPIC
Bacteria, UA: NONE SEEN
Bilirubin Urine: NEGATIVE
Glucose, UA: NEGATIVE mg/dL
Ketones, ur: NEGATIVE mg/dL
Leukocytes,Ua: NEGATIVE
Nitrite: NEGATIVE
Protein, ur: NEGATIVE mg/dL
RBC / HPF: >50 RBC/hpf (ref 0–5)
Specific Gravity, Urine: 1.01 (ref 1.005–1.030)
pH: 5 (ref 5.0–8.0)

## 2023-10-28 LAB — CBC WITH DIFFERENTIAL (CANCER CENTER ONLY)
Abs Immature Granulocytes: 0.02 K/uL (ref 0.00–0.07)
Basophils Absolute: 0 K/uL (ref 0.0–0.1)
Basophils Relative: 0 %
Eosinophils Absolute: 0.3 K/uL (ref 0.0–0.5)
Eosinophils Relative: 4 %
HCT: 35.3 % — ABNORMAL LOW (ref 39.0–52.0)
Hemoglobin: 11.2 g/dL — ABNORMAL LOW (ref 13.0–17.0)
Immature Granulocytes: 0 %
Lymphocytes Relative: 13 %
Lymphs Abs: 0.9 K/uL (ref 0.7–4.0)
MCH: 30 pg (ref 26.0–34.0)
MCHC: 31.7 g/dL (ref 30.0–36.0)
MCV: 94.6 fL (ref 80.0–100.0)
Monocytes Absolute: 0.8 K/uL (ref 0.1–1.0)
Monocytes Relative: 11 %
Neutro Abs: 4.8 K/uL (ref 1.7–7.7)
Neutrophils Relative %: 72 %
Platelet Count: 223 K/uL (ref 150–400)
RBC: 3.73 MIL/uL — ABNORMAL LOW (ref 4.22–5.81)
RDW: 14.9 % (ref 11.5–15.5)
WBC Count: 6.9 K/uL (ref 4.0–10.5)
nRBC: 0 % (ref 0.0–0.2)

## 2023-10-28 LAB — IRON AND TIBC
Iron: 121 ug/dL (ref 45–182)
Saturation Ratios: 29 % (ref 17.9–39.5)
TIBC: 419 ug/dL (ref 250–450)
UIBC: 298 ug/dL

## 2023-10-28 LAB — FERRITIN: Ferritin: 26 ng/mL (ref 24–336)

## 2023-10-28 LAB — LACTATE DEHYDROGENASE: LDH: 190 U/L (ref 98–192)

## 2023-10-28 LAB — VITAMIN B12: Vitamin B-12: 1274 pg/mL — ABNORMAL HIGH (ref 180–914)

## 2023-10-28 LAB — FOLATE: Folate: 10.2 ng/mL (ref >5.9)

## 2023-10-28 MED ORDER — COVID-19 MRNA VACCINE (PFIZER) 30 MCG/0.3ML IM SUSP: 0.3000 mL | Freq: Once | INTRAMUSCULAR | 0 refills | Status: AC

## 2023-10-28 NOTE — Telephone Encounter (Signed)
 Patient finished the Cipro  that he was prescribed by his PCP. He was at his PCP and since he was there they took his urine to see if he had a UTI and he did. Was at the [facility] [place] today and had some symptoms, so they did a urine today, found blood in urine but did not culture it. Wanted to know what he should do.

## 2023-10-28 NOTE — Telephone Encounter (Signed)
 Patient has been scheduled for follow-up visit per 10/28/23 LOS.  Pt noted appt details on personal planner/calendar.

## 2023-10-28 NOTE — Progress Notes (Unsigned)
 [facility]  HEMATOLOGY CLINIC CONSULTATION NOTE   PATIENT NAME: Jesse [name]   MR#: [id number] DOB: 1950-12-06  DATE OF SERVICE: 10/28/2023   REFERRING PROVIDER  Jesse [name] BROCKS, NP   Patient Care Team: Jesse Jesse BROCKS, NP as PCP - General (Family Medicine) Jesse Ronal BRAVO, MD (Inactive) as PCP - Cardiology (Cardiology) Jesse Elspeth BROCKS, MD as PCP - Electrophysiology (Cardiology) Jesse Lenis, MD as Consulting Physician (Orthopedic Surgery) Jesse [name], [name], RN as VBCI Care Management   REASON FOR CONSULTATION/ CHIEF COMPLAINT:  Evaluation of anemia/fatigue  ASSESSMENT & PLAN:  Jesse [name] is a 73 y.o. 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    Anemia in chronic kidney disease Chronic anemia with hemoglobin levels ranging from 9 to 12.7 over the past year, likely due to decreased erythropoietin production from chronic kidney disease. Differential diagnosis includes low iron, B12, folic acid, or hemolysis. Multiple myeloma considered but unlikely.  - Labs today showed stable hemoglobin of 11.2, MCV 94.6.  White count and platelet count are within normal limits.  Creatinine 2.41.  Iron studies show no evidence of iron deficiency.  B12, folate, LDH, haptoglobin are within normal limits.  Will obtain workup for monoclonal gammopathy, although this is less likely.  - Review test results in two weeks and discuss findings with him.  Chronic urinary tract infection Chronic urinary tract infections with recent episode of dark urine and pain, possibly indicating persistent  infection. Recent treatment with 7 days of Cipro , but symptoms suggest incomplete resolution. - Perform urinalysis and culture if indicated. - Contact urologist with results for further management.  I reviewed lab results and outside records for this visit and discussed relevant results with the patient. Diagnosis, plan of care and treatment options were also discussed in detail with the patient. Opportunity provided to ask questions and answers provided to his apparent satisfaction. Provided instructions to call our clinic with any problems, questions or concerns prior to return visit. I recommended to continue follow-up with PCP and sub-specialists. He verbalized understanding and agreed with the plan. No barriers to learning was detected.  Jesse Patten, MD  10/28/2023 11:22 AM  [facility] - A DEPT OF [name] [address] [place] [address] Dept: [phone] Dept Fax: [phone]   HISTORY OF PRESENT ILLNESS:   Discussed the use of AI scribe software for clinical note transcription with the patient, who gave verbal consent to proceed.  History of Present Illness Jesse [name] is a 73 year old male with kidney cancer who presents with anemia and fatigue. He was referred by his primary doctor for evaluation of anemia and fatigue.  He has experienced anemia and fatigue over the past year, with hemoglobin levels consistently at or below the threshold for anemia. His daughter is concerned about his lab results, which often show abnormalities. Despite these concerns, previous providers have not taken action until his nurse recommended seeing a hematologist.  He has a history of kidney cancer on the left side, initially treated with cryoablation in October and followed by radiation in April due to incomplete treatment or  regrowth. A recent CT scan without contrast showed no growth, and he is scheduled  for a six-month follow-up.  He experiences chronic urinary tract infections (UTIs), with recent symptoms of dark urine and pain after completing a seven-day course of Cipro . He has a history of needing multiple rounds of antibiotics due to anatomical issues causing bladder pressure.  He has been on oxygen  for over three years, possibly due to anatomical issues from a massive hernia affecting lung expansion. He denies having COPD and can manage short periods without oxygen , but experiences significant shortness of breath with physical activity.  He takes two iron tablets daily as advised by his nephrologist and tolerates them well. He denies any obvious blood loss, although he experienced a rare episode of mucosal bleeding over the weekend.  He underwent emergency surgery in 2017 for a gangrenous colon, resulting in a colostomy. He reports difficulty maintaining the ostomy due to its location, requiring frequent changes.    MEDICAL HISTORY Past Medical History:  Diagnosis Date   Alcohol use disorder in remission 1999   Anemia    iron deficiency   Anxiety and depression    Arthritis    ankles, knees and hands   At high risk for falls    Basal cell carcinoma    x4 (on both shoulders, one on forehead, one on nose)   CKD stage 3b, GFR 30-44 ml/min (HCC)    Former cigarette smoker 1999   Generalized anxiety disorder    Hard of hearing    Headache    History of kidney stones    Hyperlipidemia due to type 2 diabetes mellitus (HCC)    Hypertension    Morbid obesity (HCC)    Neuropathy    O2 dependent    2L   Presence of permanent cardiac pacemaker    Renal cell carcinoma of left kidney (HCC)    pt had cryotherapy for this at [facility] 12/11/22)   Risk for falls    Sleep apnea    uses CPAP   TIA (transient ischemic attack)    pt states this was not confirmed due to MRI unable to be performed   Type 2 diabetes mellitus (HCC)    Ventral hernia with bowel obstruction    Per Hospital  Encounter on 05/21/22     SURGICAL HISTORY Past Surgical History:  Procedure Laterality Date   APPENDECTOMY  1980   BREAST SURGERY Left    removed part of breast due to gynecomastia   COLOSTOMY  1989   and reversal same year   COLOSTOMY  2017   FINGER ARTHRODESIS Right 02/04/2023   Procedure: RIGHT INDEX FINGER PROXIMAL INTERPHALANGEAL JOINT ARTHRODESIS;  Surgeon: Jesse Lenis, MD;  Location: [facility] OR;  Service: Orthopedics;  Laterality: Right;   IR THORACENTESIS ASP PLEURAL SPACE W/IMG GUIDE  05/18/2021   IR THORACENTESIS ASP PLEURAL SPACE W/IMG GUIDE  10/06/2021   KNEE SURGERY Right    NASAL SEPTUM SURGERY  1982   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   Procedure: PACEMAKER IMPLANT;  Surgeon: Jesse Elspeth BROCKS, MD;  Location: [facility] INVASIVE CV LAB;  Service: Cardiovascular;  Laterality: N/A;   RENAL CRYOABLATION  12/11/2022   VARICOCELECTOMY  1983     SOCIAL HISTORY: He reports that he quit smoking about 26 years ago. His smoking use included cigarettes. He has never used smokeless tobacco. He reports that he does not currently use alcohol. He reports that he does not use drugs. Social History   Socioeconomic History  Marital status: Divorced    Spouse name: Not on file   Number of children: 3   Years of education: Not on file   Highest education level: Master's degree (e.g., MA, MS, MEng, MEd, MSW, MBA)  Occupational History   Occupation: Retired  Tobacco Use   Smoking status: Former    Current packs/day: 0.00    Types: Cigarettes    Quit date: 1999    Years since quitting: 26.7   Smokeless tobacco: Never  Vaping Use   Vaping status: Never Used  Substance and Sexual Activity   Alcohol use: Not Currently    Comment: no alcohol since 1999   Drug use: Never   Sexual activity: Not on file  Other Topics Concern   Not on file  Social History Narrative   Tobacco use, amount per day now: 0   Past tobacco use, amount per day: 1 pack   How many years did you use tobacco: 10  last 1999   Alcohol use (drinks per week): 1/5 day last 1999   Diet:   Do you drink/eat things with caffeine: Yes   Marital status:   Divorced                               What year were you married? 1980   Do you live in a house, apartment, assisted living, condo, trailer, etc.?    Is it one or more stories? 1   How many persons live in your home? 1   Do you have pets in your home?( please list) Cat   Highest Level of education completed? Post Grad   Current or past profession: Engineer, manufacturing.   Do you exercise? Jesse [name]                                 Type and how often?   Do you have a living will? Yes   Do you have a DNR form?        No                           If not, do you want to discuss one?   Do you have signed POA/HPOA forms?   Yes                     If so, please bring to you appointment      Do you have any difficulty bathing or dressing yourself? Yes   Do you have any difficulty preparing food or eating? No   Do you have any difficulty managing your medications? No   Do you have any difficulty managing your finances? No   Do you have any difficulty affording your medications?  No   Social Drivers of Corporate investment banker Strain: Medium Risk (10/11/2023)   Overall Financial Resource Strain (CARDIA)    Difficulty of Paying Living Expenses: Somewhat hard  Food Insecurity: No Food Insecurity (10/28/2023)   Hunger Vital Sign    Worried About Running Out of Food in the Last Year: Never true    Ran Out of Food in the Last Year: Never true  Transportation Needs: No Transportation Needs (10/28/2023)   PRAPARE - Administrator, Civil Service (Medical): No    Lack of Transportation (Non-Medical): No  Physical Activity: Inactive (  10/11/2023)   Exercise Vital Sign    Days of Exercise per Week: 0 days    Minutes of Exercise per Session: Not on file  Stress: Stress Concern Present (10/11/2023)   [employer] of Occupational Health - Occupational Stress  Questionnaire    Feeling of Stress: Rather much  Social Connections: Socially Isolated (10/11/2023)   Social Connection and Isolation Panel    Frequency of Communication with Friends and Family: Twice a week    Frequency of Social Gatherings with Friends and Family: Twice a week    Attends Religious Services: Never    Database administrator or Organizations: No    Attends Engineer, structural: Not on file    Marital Status: Divorced  Intimate Partner Violence: Not At Risk (10/28/2023)   Humiliation, Afraid, Rape, and Kick questionnaire    Fear of Current or Ex-Partner: No    Emotionally Abused: No    Physically Abused: No    Sexually Abused: No    FAMILY HISTORY: His family history includes Anxiety disorder in his son; Bipolar disorder in his daughter; Diabetes in his brother.  CURRENT MEDICATIONS   Current Outpatient Medications  Medication Instructions   acetaminophen  (TYLENOL ) 650 mg, Oral, Every 6 hours   alfuzosin  (UROXATRAL ) 10 mg, Oral, Daily   ALPRAZolam  (XANAX ) 0.5 MG tablet TAKE 1 TABLET(0.5 MG) BY MOUTH TWICE DAILY AS NEEDED FOR ANXIETY OR SLEEP   Cholecalciferol (VITAMIN D3 SUPER STRENGTH) 2,000 Units, Daily   Continuous Glucose Sensor (DEXCOM G7 SENSOR) MISC APPLY 1 SENSOR TO SKIN EVERY 10 DAYS AS DIRECTED   COVID-19 mRNA vaccine, Pfizer, 30 MCG/0.3ML injection 0.3 mLs, Intramuscular,  Once   Cyanocobalamin  (B-12 PO) 1 tablet, Daily   diclofenac  Sodium (VOLTAREN ) 2 g, Topical, 4 times daily, Apply to your right neck and shoulder area 3-4 times a day as needed   DULoxetine  (CYMBALTA ) 60 MG capsule TAKE 1 CAPSULE(60 MG) BY MOUTH DAILY   ELIQUIS  5 MG TABS tablet TAKE 1 TABLET(5 MG) BY MOUTH TWICE DAILY   Ferrous Sulfate  (IRON PO) 2 tablets, Daily   Gemtesa  75 mg, Oral, Daily   guaiFENesin  (MUCINEX ) 600 mg, Oral, 2 times daily   HYDROcodone -acetaminophen  (NORCO/VICODIN) 5-325 MG tablet 1 tablet, Oral, At bedtime PRN   insulin  aspart (NOVOLOG ) 100 UNIT/ML injection  Max daily 100 units   isosorbide  mononitrate (IMDUR ) 60 MG 24 hr tablet TAKE 1 TABLET(60 MG) BY MOUTH DAILY   ketoconazole  (NIZORAL ) 2 % cream APPLY TOPICALLY TO THE AFFECTED AREA AS NEEDED FOR IRRITATION   methocarbamol  (ROBAXIN ) 500 MG tablet TAKE 1 TABLET(500 MG) BY MOUTH EVERY 8 HOURS AS NEEDED FOR MUSCLE SPASMS   metolazone  (ZAROXOLYN ) 2.5 mg, Oral, Daily, Take 1-2 times a week as needed , spacing out 3 days between doses   ondansetron  (ZOFRAN ) 4 MG tablet TAKE 1 TABLET(4 MG) BY MOUTH EVERY 8 HOURS AS NEEDED FOR NAUSEA OR VOMITING   OXYGEN  2 L, Continuous   pantoprazole  (PROTONIX ) 40 mg, Oral, 2 times daily   potassium chloride  SA (KLOR-CON  M) 20 MEQ tablet 20 mEq, Oral, 2 times daily   Red Yeast Rice 1,200 mg, Daily   rOPINIRole  (REQUIP ) 2 MG tablet TAKE 1 TABLET(2 MG) BY MOUTH AT BEDTIME   rosuvastatin  (CRESTOR ) 20 mg, Oral, Daily   saccharomyces boulardii (FLORASTOR) 250 mg, Oral, 2 times daily   tirzepatide  (MOUNJARO ) 7.5 mg, Subcutaneous, Weekly   tiZANidine  (ZANAFLEX ) 2 mg, Oral, 2 times daily   topiramate  (TOPAMAX ) 25 mg, Oral, Daily  torsemide (DEMADEX) 100 mg, Daily   traMADol  (ULTRAM ) 50 MG tablet TAKE 1 TABLET(50 MG) BY MOUTH TWICE DAILY AS NEEDED   traZODone  (DESYREL ) 50 MG tablet TAKE 2 TABLETS(100 MG) BY MOUTH AT BEDTIME   triamcinolone  cream (KENALOG ) 0.1 % 1 Application, As needed   Vitamin D  (Ergocalciferol ) (DRISDOL ) 50,000 Units, Oral, Every 7 days     ALLERGIES  He is allergic to tape.  REVIEW OF SYSTEMS:  Review of Systems - Oncology   Rest of the pertinent review of systems is unremarkable except as mentioned above in HPI.  PHYSICAL EXAMINATION:    Onc Performance Status - 10/28/23 1042       ECOG Perf Status   ECOG Perf Status Capable of only limited selfcare, confined to bed or chair more than 50% of waking hours      KPS SCALE   KPS % SCORE Requires considerable assistance, and frequent medical care          Vitals:   10/28/23 1032  BP:  (!) 110/52  Pulse: 85  Resp: 18  Temp: 97.9 F (36.6 C)  SpO2: 94%   There were no vitals filed for this visit.  Physical Exam Constitutional:      General: He is not in acute distress.    Comments: Presented to clinic in a motorized wheelchair  HENT:     Head: Normocephalic and atraumatic.  Eyes:     Conjunctiva/sclera: Conjunctivae normal.  Cardiovascular:     Rate and Rhythm: Normal rate and regular rhythm.  Pulmonary:     Effort: Pulmonary effort is normal. No respiratory distress.     Comments: Wearing O2 via [place] Abdominal:     Comments: Abdominal wall hernia  Colostomy in place  Lymphadenopathy:     Cervical: No cervical adenopathy.  Neurological:     General: No focal deficit present.     Mental Status: He is alert and oriented to person, place, and time.  Psychiatric:        Mood and Affect: Mood normal.        Behavior: Behavior normal.      LABORATORY DATA:   I have reviewed the data as listed.  Results for orders placed or performed in visit on 10/28/23  Urine Culture   Specimen: Urine, Clean Catch  Result Value Ref Range   Specimen Description      URINE, CLEAN CATCH Performed at [facility], [address], [place], [place] [address]    Special Requests      NONE Performed at [facility], [address] [place], [place] [address]    Culture      NO GROWTH Performed at [facility], [address], [place], [place] [address]    Report Status 10/29/2023 FINAL   Urinalysis, Complete w Microscopic  Result Value Ref Range   Color, Urine COLORLESS (A) YELLOW   APPearance CLEAR CLEAR   Specific Gravity, Urine 1.010 1.005 - 1.030   pH 5.0 5.0 - 8.0   Glucose, UA NEGATIVE NEGATIVE mg/dL   Hgb urine dipstick LARGE (A) NEGATIVE   Bilirubin Urine NEGATIVE NEGATIVE   Ketones, ur NEGATIVE NEGATIVE mg/dL   Protein, ur NEGATIVE NEGATIVE mg/dL   Nitrite NEGATIVE NEGATIVE   Leukocytes,Ua NEGATIVE  NEGATIVE   RBC / HPF >50 0 - 5 RBC/hpf   WBC, UA 6-10 0 - 5 WBC/hpf   Bacteria, UA NONE SEEN NONE SEEN   Squamous Epithelial / HPF 0-5 0 -  5 /HPF   Mucus PRESENT    Hyaline Casts, UA PRESENT   Kappa/lambda light chains  Result Value Ref Range   Kappa free light chain 66.9 (H) 3.3 - 19.4 mg/L   Lambda free light chains 44.6 (H) 5.7 - 26.3 mg/L   Kappa, lambda light chain ratio 1.50 0.26 - 1.65  Haptoglobin  Result Value Ref Range   Haptoglobin 264 34 - 355 mg/dL  Lactate dehydrogenase  Result Value Ref Range   LDH 190 98 - 192 U/L  Folate  Result Value Ref Range   Folate 10.2 >5.9 ng/mL  Vitamin B12  Result Value Ref Range   Vitamin B-12 1,274 (H) 180 - 914 pg/mL  Ferritin  Result Value Ref Range   Ferritin 26 24 - 336 ng/mL  Iron and TIBC  Result Value Ref Range   Iron 121 45 - 182 ug/dL   TIBC 580 749 - 549 ug/dL   Saturation Ratios 29 17.9 - 39.5 %   UIBC 298 ug/dL  CMP (Cancer Center only)  Result Value Ref Range   Sodium 138 135 - 145 mmol/L   Potassium 3.3 (L) 3.5 - 5.1 mmol/L   Chloride 95 (L) 98 - 111 mmol/L   CO2 29 22 - 32 mmol/L   Glucose, Bld 182 (H) 70 - 99 mg/dL   BUN 36 (H) 8 - 23 mg/dL   Creatinine 7.58 (H) 9.38 - 1.24 mg/dL   Calcium  9.3 8.9 - 10.3 mg/dL   Total Protein 7.4 6.5 - 8.1 g/dL   Albumin 4.2 3.5 - 5.0 g/dL   AST 21 15 - 41 U/L   ALT 14 0 - 44 U/L   Alkaline Phosphatase 72 38 - 126 U/L   Total Bilirubin 0.5 0.0 - 1.2 mg/dL   GFR, Estimated 27 (L) >60 mL/min   Anion gap 14 5 - 15  CBC with Differential (Cancer Center Only)  Result Value Ref Range   WBC Count 6.9 4.0 - 10.5 K/uL   RBC 3.73 (L) 4.22 - 5.81 MIL/uL   Hemoglobin 11.2 (L) 13.0 - 17.0 g/dL   HCT 64.6 (L) 60.9 - 47.9 %   MCV 94.6 80.0 - 100.0 fL   MCH 30.0 26.0 - 34.0 pg   MCHC 31.7 30.0 - 36.0 g/dL   RDW 85.0 88.4 - 84.4 %   Platelet Count 223 150 - 400 K/uL   nRBC 0.0 0.0 - 0.2 %   Neutrophils Relative % 72 %   Neutro Abs 4.8 1.7 - 7.7 K/uL   Lymphocytes Relative 13  %   Lymphs Abs 0.9 0.7 - 4.0 K/uL   Monocytes Relative 11 %   Monocytes Absolute 0.8 0.1 - 1.0 K/uL   Eosinophils Relative 4 %   Eosinophils Absolute 0.3 0.0 - 0.5 K/uL   Basophils Relative 0 %   Basophils Absolute 0.0 0.0 - 0.1 K/uL   Immature Granulocytes 0 %   Abs Immature Granulocytes 0.02 0.00 - 0.07 K/uL     RADIOGRAPHIC STUDIES:  No pertinent imaging studies available to review.  Orders Placed This Encounter  Procedures   Urine Culture    Standing Status:   Future    Expiration Date:   10/27/2024   CBC with Differential (Cancer Center Only)    Standing Status:   Future    Expiration Date:   10/27/2024   CMP (Cancer Center only)    Standing Status:   Future    Expiration Date:   10/27/2024  Iron and TIBC    Standing Status:   Future    Expiration Date:   10/27/2024   Ferritin    Standing Status:   Future    Expiration Date:   10/27/2024   Vitamin B12    Standing Status:   Future    Expiration Date:   10/27/2024   Folate    Standing Status:   Future    Expiration Date:   10/27/2024   Lactate dehydrogenase    Standing Status:   Future    Expiration Date:   10/27/2024   Haptoglobin    Standing Status:   Future    Expiration Date:   10/27/2024   Kappa/lambda light chains    Standing Status:   Future    Expiration Date:   10/27/2024   Multiple Myeloma Panel (SPEP&IFE w/QIG)    Standing Status:   Future    Expiration Date:   10/27/2024   Urinalysis, Complete w Microscopic    Standing Status:   Future    Expiration Date:   10/27/2024    Future Appointments  Date Time Provider Department Center  11/11/2023  3:45 PM Alija Riano, Chinita, MD [facility] None  11/15/2023  2:30 PM Jesse [name], [name], RN [facility] None  11/18/2023  3:00 PM Ezzard Remak D, LCSW [facility] None  12/09/2023 10:30 AM Roseann, Adine PARAS., MD [facility] None  12/13/2023  7:35 AM [facility] HVT DEVICE REMOTES [facility] H&V  01/27/2024  3:15 PM [facility] PHLEBOTOMIST [facility] None  01/27/2024  3:45 PM Doy Taaffe, MD [facility]  None  03/13/2024  7:35 AM [facility] HVT DEVICE REMOTES [facility] H&V  04/21/2024 10:20 AM Ngetich, [name] C, NP [facility] None  07/07/2024 11:00 AM Ngetich, [name] C, NP [facility] None    I spent a total of 55 minutes during this encounter with the patient including review of chart and various tests results, discussions about plan of care and coordination of care plan.  This document was completed utilizing speech recognition software. Grammatical errors, random word insertions, pronoun errors, and incomplete sentences are an occasional consequence of this system due to software limitations, ambient noise, and hardware issues. Any formal questions or concerns about the content, text or information contained within the body of this dictation should be directly addressed to the provider for clarification.

## 2023-10-29 LAB — URINE CULTURE: Culture: NO GROWTH

## 2023-10-29 LAB — KAPPA/LAMBDA LIGHT CHAINS
Kappa free light chain: 66.9 mg/L — ABNORMAL HIGH (ref 3.3–19.4)
Kappa, lambda light chain ratio: 1.5 (ref 0.26–1.65)
Lambda free light chains: 44.6 mg/L — ABNORMAL HIGH (ref 5.7–26.3)

## 2023-10-29 LAB — HAPTOGLOBIN: Haptoglobin: 264 mg/dL (ref 34–355)

## 2023-10-29 MED ORDER — COVID-19 MRNA VAC-TRIS(PFIZER) 30 MCG/0.3ML IM SUSY: 0.3000 mL | PREFILLED_SYRINGE | Freq: Once | INTRAMUSCULAR | 0 refills | Status: AC

## 2023-10-29 NOTE — Telephone Encounter (Signed)
 MyChart message sent to patient.

## 2023-10-29 NOTE — Telephone Encounter (Signed)
 Spoke with pt in reference to ucx results. Pt was thankful a ucx was sent. Reinforced with pt to check mychart for results and give us  a call back, if needed. Pt voiced understanding.

## 2023-10-30 DIAGNOSIS — E118 Type 2 diabetes mellitus with unspecified complications: Secondary | ICD-10-CM | POA: Diagnosis not present

## 2023-10-30 DIAGNOSIS — Z933 Colostomy status: Secondary | ICD-10-CM | POA: Diagnosis not present

## 2023-10-30 DIAGNOSIS — E119 Type 2 diabetes mellitus without complications: Secondary | ICD-10-CM | POA: Diagnosis not present

## 2023-10-30 DIAGNOSIS — K56609 Unspecified intestinal obstruction, unspecified as to partial versus complete obstruction: Secondary | ICD-10-CM | POA: Diagnosis not present

## 2023-10-30 DIAGNOSIS — Z794 Long term (current) use of insulin: Secondary | ICD-10-CM | POA: Diagnosis not present

## 2023-11-01 ENCOUNTER — Emergency Department (HOSPITAL_COMMUNITY)

## 2023-11-01 ENCOUNTER — Encounter: Payer: Self-pay | Admitting: Oncology

## 2023-11-01 ENCOUNTER — Encounter (HOSPITAL_COMMUNITY): Payer: Self-pay

## 2023-11-01 ENCOUNTER — Emergency Department (HOSPITAL_COMMUNITY)
Admission: EM | Admit: 2023-11-01 | Discharge: 2023-11-01 | Disposition: A | Discharge status: Home / Self Care | Attending: Emergency Medicine | Admitting: Emergency Medicine

## 2023-11-01 ENCOUNTER — Other Ambulatory Visit: Payer: Self-pay

## 2023-11-01 DIAGNOSIS — N189 Chronic kidney disease, unspecified: Secondary | ICD-10-CM | POA: Diagnosis not present

## 2023-11-01 DIAGNOSIS — D649 Anemia, unspecified: Secondary | ICD-10-CM | POA: Insufficient documentation

## 2023-11-01 DIAGNOSIS — R319 Hematuria, unspecified: Secondary | ICD-10-CM | POA: Insufficient documentation

## 2023-11-01 DIAGNOSIS — Z7901 Long term (current) use of anticoagulants: Secondary | ICD-10-CM | POA: Diagnosis not present

## 2023-11-01 DIAGNOSIS — K7689 Other specified diseases of liver: Secondary | ICD-10-CM | POA: Diagnosis not present

## 2023-11-01 DIAGNOSIS — R0600 Dyspnea, unspecified: Secondary | ICD-10-CM | POA: Diagnosis not present

## 2023-11-01 DIAGNOSIS — I129 Hypertensive chronic kidney disease with stage 1 through stage 4 chronic kidney disease, or unspecified chronic kidney disease: Secondary | ICD-10-CM | POA: Insufficient documentation

## 2023-11-01 DIAGNOSIS — R932 Abnormal findings on diagnostic imaging of liver and biliary tract: Secondary | ICD-10-CM | POA: Diagnosis not present

## 2023-11-01 DIAGNOSIS — K439 Ventral hernia without obstruction or gangrene: Secondary | ICD-10-CM | POA: Diagnosis not present

## 2023-11-01 DIAGNOSIS — E1122 Type 2 diabetes mellitus with diabetic chronic kidney disease: Secondary | ICD-10-CM | POA: Diagnosis not present

## 2023-11-01 DIAGNOSIS — R059 Cough, unspecified: Secondary | ICD-10-CM | POA: Diagnosis present

## 2023-11-01 DIAGNOSIS — Z794 Long term (current) use of insulin: Secondary | ICD-10-CM | POA: Insufficient documentation

## 2023-11-01 DIAGNOSIS — I517 Cardiomegaly: Secondary | ICD-10-CM | POA: Diagnosis not present

## 2023-11-01 DIAGNOSIS — J90 Pleural effusion, not elsewhere classified: Secondary | ICD-10-CM | POA: Diagnosis not present

## 2023-11-01 DIAGNOSIS — R0602 Shortness of breath: Secondary | ICD-10-CM | POA: Diagnosis not present

## 2023-11-01 DIAGNOSIS — J9811 Atelectasis: Secondary | ICD-10-CM | POA: Diagnosis not present

## 2023-11-01 LAB — BASIC METABOLIC PANEL WITH GFR
Anion gap: 12 (ref 5–15)
BUN: 26 mg/dL — ABNORMAL HIGH (ref 8–23)
CO2: 25 mmol/L (ref 22–32)
Calcium: 8.8 mg/dL — ABNORMAL LOW (ref 8.9–10.3)
Chloride: 102 mmol/L (ref 98–111)
Creatinine, Ser: 1.85 mg/dL — ABNORMAL HIGH (ref 0.61–1.24)
GFR, Estimated: 38 mL/min — ABNORMAL LOW (ref >60)
Glucose, Bld: 165 mg/dL — ABNORMAL HIGH (ref 70–99)
Potassium: 4 mmol/L (ref 3.5–5.1)
Sodium: 138 mmol/L (ref 135–145)

## 2023-11-01 LAB — CBC
HCT: 32.1 % — ABNORMAL LOW (ref 39.0–52.0)
Hemoglobin: 9.9 g/dL — ABNORMAL LOW (ref 13.0–17.0)
MCH: 30.4 pg (ref 26.0–34.0)
MCHC: 30.8 g/dL (ref 30.0–36.0)
MCV: 98.5 fL (ref 80.0–100.0)
Platelets: 188 K/uL (ref 150–400)
RBC: 3.26 MIL/uL — ABNORMAL LOW (ref 4.22–5.81)
RDW: 14.9 % (ref 11.5–15.5)
WBC: 6.6 K/uL (ref 4.0–10.5)
nRBC: 0 % (ref 0.0–0.2)

## 2023-11-01 LAB — URINALYSIS, ROUTINE W REFLEX MICROSCOPIC
Bilirubin Urine: NEGATIVE
Glucose, UA: 50 mg/dL — AB
Ketones, ur: NEGATIVE mg/dL
Leukocytes,Ua: NEGATIVE
Nitrite: NEGATIVE
Protein, ur: NEGATIVE mg/dL
RBC / HPF: >50 RBC/hpf (ref 0–5)
Specific Gravity, Urine: 1.013 (ref 1.005–1.030)
pH: 5 (ref 5.0–8.0)

## 2023-11-01 LAB — RESP PANEL BY RT-PCR (RSV, FLU A&B, COVID)  RVPGX2
Influenza A by PCR: NEGATIVE
Influenza B by PCR: NEGATIVE
Resp Syncytial Virus by PCR: NEGATIVE
SARS Coronavirus 2 by RT PCR: NEGATIVE

## 2023-11-01 LAB — PRO BRAIN NATRIURETIC PEPTIDE: Pro Brain Natriuretic Peptide: 361 pg/mL — ABNORMAL HIGH (ref <300.0)

## 2023-11-01 MED ORDER — FUROSEMIDE 10 MG/ML IJ SOLN
40.0000 mg | Freq: Once | INTRAMUSCULAR | Status: AC
  Administered 2023-11-01: 40 mg via INTRAVENOUS
  Filled 2023-11-01: qty 4

## 2023-11-01 MED ORDER — IOHEXOL 300 MG/ML  SOLN
80.0000 mL | Freq: Once | INTRAMUSCULAR | Status: AC | PRN
  Administered 2023-11-01: 80 mL via INTRAVENOUS

## 2023-11-01 NOTE — ED Triage Notes (Signed)
 Patient said he has chronic kidney disease. Was told that his GFR went from 39 to 27 in 10 days. Stated his urine is dark and has blood in it.

## 2023-11-01 NOTE — Discharge Instructions (Addendum)
 Thank you for letting us  evaluate you today.  Your CT imaging continues to show mass on kidney which is unchanged.  Your urine does not appear infectious.  You do have blood in your urine.  Your blood count dropped 1 point from 4 days ago which is likely secondary to something such as prostate, bladder causing blood in your urine.  Stop taking Eliquis  for next couple days.  Continue taking iron.  Please follow-up with urology for further management of this  You are mildly fluid overloaded.  We have given you an IV dose of torsemide here in Emergency Department which should help with your breathing.  You may continue to take torsemide as prescribed

## 2023-11-01 NOTE — ED Notes (Signed)
 Pt completed ambulatory trial with an oxygen  level of approx 92%-96% while transferring from bed to wheelchair and back to bed. Pt ambulated with minimal assistance and steady gait.

## 2023-11-01 NOTE — Assessment & Plan Note (Addendum)
 Chronic anemia with hemoglobin levels ranging from 9 to 12.7 over the past year, likely due to decreased erythropoietin production from chronic kidney disease. Differential diagnosis includes low iron, B12, folic acid, or hemolysis. Multiple myeloma considered but unlikely.  - Labs today showed stable hemoglobin of 11.2, MCV 94.6.  White count and platelet count are within normal limits.  Creatinine 2.41.  Iron studies show no evidence of iron deficiency.  B12, folate, LDH, haptoglobin are within normal limits.  Will obtain workup for monoclonal gammopathy, although this is less likely.  - Review test results in two weeks and discuss findings with him.

## 2023-11-01 NOTE — ED Provider Triage Note (Signed)
 Emergency Medicine Provider Triage Evaluation Note  Jesse [name] , a 73 y.o. male  was evaluated in triage.  Pt complains of concern regarding elevated creatinine, BP, generalized weakness, hematuria.   Creatinine elevated from 1.82 to 2.41 when labs were obtained by hematology   Normal GFR around 32 and decreased to 27. Hx of kidney cancer (remission)  BP normally normal and increased to 170 this morning  Hematuria. Culture on 10/28/23 wo infection. Hx of stones. No flank pain  SHOB worsening over past few days esp with exertion. On torsemide and complaint. Normally on 2L. Productive mucous requiring cough for past 2 weeks. Reports increased temp   Review of Systems  Positive: See hpi Negative:   Physical Exam  BP 134/73   Pulse 83   Temp 98 F (36.7 C) (Oral)   Resp 19   Ht 6' (1.829 m)   Wt (!) 181.4 kg   SpO2 98%   BMI 54.25 kg/m  Gen:   Awake, no distress   Resp:  Normal effort. Normally on 2L O2 MSK:   Moves extremities without difficulty  Other:    Medical Decision Making  Medically screening exam initiated at 12:50 PM.  Appropriate orders placed.  Jesse [name] was informed that the remainder of the evaluation will be completed by another provider, this initial triage assessment does not replace that evaluation, and the importance of remaining in the ED until their evaluation is complete.  Labs and CXR ordered   Jesse [name], Jesse [name] 11/01/23 1258

## 2023-11-01 NOTE — ED Provider Notes (Signed)
 [place] EMERGENCY DEPARTMENT AT [facility] Provider Note   CSN: [id number] Arrival date & time: 11/01/23  1223     Patient presents with: Abnormal Lab   Jesse [name] is a 73 y.o. male with past medical history of insulin -dependent T2DM, former tobacco abuse, CKD, anemia, OSA, HTN, colostomy, PAF, pacemaker, BPH, OAB, renal cell carcinoma (in remission) presents to emergency department for evaluation of multiple symptoms.  Firstly complains of intermittent dark urine over the past week.  No dysuria nor burning with urination.  Recently finished ciprofloxacin  a week ago for UTI.  Most recent culture from 10/28/2023 showed no infection but hematuria. Follows Dr. Roseann with [facility]. Is on Eliquis  5 mg twice daily for TIA and has been compliant.  Denies dizziness, lightheadedness  Also complains of worsening shortness of breath over past few days especially with exertion, ambulation.  Reports that he has had mucus in throat that he has had to cough up.  Takes torsemide 40 mg twice daily.  Normally has mild pedal edema with L>R but reports that swelling is worsened over past couple days.  Denies cough, congestion, CP  Also concerned about his elevated creatinine from labwork obtained by hematology.    Abnormal Lab      Prior to Admission medications   Medication Sig Start Date End Date Taking? Authorizing Provider  acetaminophen  (TYLENOL ) 325 MG tablet Take 2 tablets (650 mg total) by mouth every 6 (six) hours. 02/04/23   [name], MD  alfuzosin  (UROXATRAL ) 10 MG 24 hr tablet TAKE 1 TABLET BY MOUTH EVERY DAY 08/01/23   Ngetich, [name] C, NP  ALPRAZolam  (XANAX ) 0.5 MG tablet TAKE 1 TABLET(0.5 MG) BY MOUTH TWICE DAILY AS NEEDED FOR ANXIETY OR SLEEP 08/16/23   Caro Harlene POUR, NP  Cholecalciferol (VITAMIN D3 SUPER STRENGTH) 50 MCG (2000 UT) TABS Take 2,000 Units by mouth daily.    [provider]  Continuous Glucose Sensor (DEXCOM G7 SENSOR) MISC  APPLY 1 SENSOR TO SKIN EVERY 10 DAYS AS DIRECTED 08/07/23   Shamleffer, Ibtehal Jaralla, MD  Cyanocobalamin  (B-12 PO) Take 1 tablet by mouth daily.    [provider]  diclofenac  Sodium (VOLTAREN ) 1 % GEL Apply 2 g topically 4 (four) times daily. Apply to your right neck and shoulder area 3-4 times a day as needed 09/05/22   Ngetich, [name] C, NP  DULoxetine  (CYMBALTA ) 60 MG capsule TAKE 1 CAPSULE(60 MG) BY MOUTH DAILY 09/10/23   Ngetich, [name] C, NP  ELIQUIS  5 MG TABS tablet TAKE 1 TABLET(5 MG) BY MOUTH TWICE DAILY 09/25/23   [name], MD  Ferrous Sulfate  (IRON PO) Take 2 tablets by mouth daily.    [provider]  guaiFENesin  (MUCINEX ) 600 MG 12 hr tablet Take 1 tablet (600 mg total) by mouth 2 (two) times daily. 10/15/23   Ngetich, [name] C, NP  HYDROcodone -acetaminophen  (NORCO/VICODIN) 5-325 MG tablet Take 1 tablet by mouth at bedtime as needed for moderate pain (pain score 4-6). 10/01/23   Ngetich, [name] C, NP  insulin  aspart (NOVOLOG ) 100 UNIT/ML injection Max daily 100 units 11/27/22   Shamleffer, Ibtehal Jaralla, MD  isosorbide  mononitrate (IMDUR ) 60 MG 24 hr tablet TAKE 1 TABLET(60 MG) BY MOUTH DAILY 09/25/23   Ngetich, [name] C, NP  ketoconazole  (NIZORAL ) 2 % cream APPLY TOPICALLY TO THE AFFECTED AREA AS NEEDED FOR IRRITATION 11/22/22   Ngetich, [name] C, NP  methocarbamol  (ROBAXIN ) 500 MG tablet TAKE 1 TABLET(500 MG) BY MOUTH EVERY 8 HOURS AS  NEEDED FOR MUSCLE SPASMS 03/05/23   Ngetich, [name] C, NP  metolazone  (ZAROXOLYN ) 2.5 MG tablet Take 1 tablet (2.5 mg total) by mouth daily. Take 1-2 times a week as needed , spacing out 3 days between doses 11/27/22   Ngetich, [name] C, NP  ondansetron  (ZOFRAN ) 4 MG tablet TAKE 1 TABLET(4 MG) BY MOUTH EVERY 8 HOURS AS NEEDED FOR NAUSEA OR VOMITING Patient taking differently: as needed. 10/03/22   Ngetich, [name] C, NP  OXYGEN  Inhale 2 L into the lungs continuous.    [provider]  pantoprazole  (PROTONIX ) 40 MG tablet Take 1 tablet (40  mg total) by mouth 2 (two) times daily. 09/06/23   Medina-Vargas, Monina C, NP  potassium chloride  SA (KLOR-CON  M) 20 MEQ tablet Take 1 tablet (20 mEq total) by mouth 2 (two) times daily. 02/15/22   Singh, Prashant K, MD  Red Yeast Rice 600 MG CAPS Take 1,200 mg by mouth daily.    [provider]  rOPINIRole  (REQUIP ) 2 MG tablet TAKE 1 TABLET(2 MG) BY MOUTH AT BEDTIME 01/08/23   Ngetich, [name] C, NP  rosuvastatin  (CRESTOR ) 10 MG tablet Take 2 tablets (20 mg total) by mouth daily. 04/22/23   [name], MD  saccharomyces boulardii (FLORASTOR) 250 MG capsule Take 1 capsule (250 mg total) by mouth 2 (two) times daily. 10/18/23   Ngetich, [name] C, NP  tirzepatide  (MOUNJARO ) 7.5 MG/0.5ML Pen Inject 7.5 mg into the skin once a week. 05/03/23   Shamleffer, Ibtehal Jaralla, MD  tiZANidine  (ZANAFLEX ) 2 MG tablet TAKE 1 TABLET BY MOUTH TWICE DAILY 06/17/23   Ngetich, [name] C, NP  topiramate  (TOPAMAX ) 25 MG tablet TAKE 1 TABLET BY MOUTH EVERY DAY 02/18/23   Ngetich, [name] C, NP  torsemide (DEMADEX) 100 MG tablet Take 100 mg by mouth daily. Patient takes 1/2 tablet in the morning and 1/2 tablet in the afternoon 04/10/23   [provider]  traMADol  (ULTRAM ) 50 MG tablet TAKE 1 TABLET(50 MG) BY MOUTH TWICE DAILY AS NEEDED Patient taking differently: as needed. TAKE 1 TABLET(50 MG) BY MOUTH TWICE DAILY AS NEEDED 07/25/23   Ngetich, [name] C, NP  traZODone  (DESYREL ) 50 MG tablet TAKE 2 TABLETS(100 MG) BY MOUTH AT BEDTIME 09/13/23   Ngetich, [name] C, NP  triamcinolone  cream (KENALOG ) 0.1 % Apply 1 Application topically as needed. 06/30/21   [provider]  Vibegron  (GEMTESA ) 75 MG TABS Take 1 tablet (75 mg total) by mouth daily. 09/05/23   Stoneking, Adine PARAS., MD  Vitamin D , Ergocalciferol , (DRISDOL ) 1.25 MG (50000 UNIT) CAPS capsule TAKE 1 CAPSULE BY MOUTH EVERY 7 DAYS 10/17/23   Ngetich, Roxan BROCKS, NP    Allergies: Tape    Review of Systems  Respiratory:  Positive for shortness of breath.      Updated Vital Signs BP 137/71   Pulse 70   Temp 97.8 F (36.6 C) (Oral)   Resp 18   Ht 6' (1.829 m)   Wt (!) 181.4 kg   SpO2 100%   BMI 54.25 kg/m   Physical Exam Vitals and nursing note reviewed.  Constitutional:      General: He is not in acute distress.    Appearance: Normal appearance.  HENT:     Head: Normocephalic and atraumatic.  Eyes:     Conjunctiva/sclera: Conjunctivae normal.  Cardiovascular:     Rate and Rhythm: Normal rate.  Pulmonary:     Effort: Pulmonary effort is normal. No respiratory distress.     Comments: Speaking  in full complete sentences without difficulty.  On 2 L oxygen  per his baseline and maintaining oxygen  saturation Musculoskeletal:     Right lower leg: 1+ Pitting Edema present.     Left lower leg: 2+ Pitting Edema present.  Skin:    Coloration: Skin is not jaundiced or pale.  Neurological:     Mental Status: He is alert. Mental status is at baseline.     (all labs ordered are listed, but only abnormal results are displayed) Labs Reviewed  CBC - Abnormal; Notable for the following components:      Result Value   RBC 3.26 (*)    Hemoglobin 9.9 (*)    HCT 32.1 (*)    All other components within normal limits  BASIC METABOLIC PANEL WITH GFR - Abnormal; Notable for the following components:   Glucose, Bld 165 (*)    BUN 26 (*)    Creatinine, Ser 1.85 (*)    Calcium  8.8 (*)    GFR, Estimated 38 (*)    All other components within normal limits  URINALYSIS, ROUTINE W REFLEX MICROSCOPIC - Abnormal; Notable for the following components:   Glucose, UA 50 (*)    Hgb urine dipstick LARGE (*)    Bacteria, UA RARE (*)    All other components within normal limits  PRO BRAIN NATRIURETIC PEPTIDE - Abnormal; Notable for the following components:   Pro Brain Natriuretic Peptide 361.0 (*)    All other components within normal limits  RESP PANEL BY RT-PCR (RSV, FLU A&B, COVID)  [id number]    EKG: EKG Interpretation Date/Time:  Friday  November 01 2023 18:14:48 EDT Ventricular Rate:  71 PR Interval:  222 QRS Duration:  141 QT Interval:  435 QTC Calculation: 470 R Axis:   45  Text Interpretation: A-V dual-paced complexes w/ some inhibition No further analysis attempted due to paced rhythm No significant change since last tracing Confirmed by Dean Clarity [phone]) on 11/01/2023 6:18:25 PM  Radiology: CT ABDOMEN PELVIS W CONTRAST Result Date: 11/01/2023 CLINICAL DATA:  Hematuria EXAM: CT ABDOMEN AND PELVIS WITH CONTRAST TECHNIQUE: Multidetector CT imaging of the abdomen and pelvis was performed using the standard protocol following bolus administration of intravenous contrast. RADIATION DOSE REDUCTION: This exam was performed according to the departmental dose-optimization program which includes automated exposure control, adjustment of the mA and/or kV according to patient size and/or use of iterative reconstruction technique. CONTRAST:  80mL OMNIPAQUE  IOHEXOL  300 MG/ML  SOLN COMPARISON:  CT 09/24/2022, 02/09/2022 FINDINGS: Lower chest: Lung bases demonstrate chronic left pleural effusion and partially visualized consolidation in left lower lobe suggestive of round atelectasis. Cardiomegaly with incompletely visualized cardiac pacing leads. Hepatobiliary: No calcified gallstone. Possible surface nodularity of liver as may be seen with cirrhosis. Small subcapsular cyst in the left hepatic lobe. Pancreas: Unremarkable. No pancreatic ductal dilatation or surrounding inflammatory changes. Spleen: Normal in size without focal abnormality. Adrenals/Urinary Tract: Adrenal glands are normal. Renal atrophy. No hydronephrosis. Mild hyperdensity within the renal collecting systems, probably represents mild excreted contrast rather than stones. Ill-defined masslike area at the lower pole of the left kidney, better seen on the prior exam, measures about 5.6 cm compared with 4.8 cm previously. Increased perinephric stranding and fluid is and hazy  attenuation at the left renal pelvis and hilus, series 11, image 36. No ureteral stone. Bladder is unremarkable. Stomach/Bowel: Stomach is within a ventral hernia. There is no dilated small bowel. Partial colectomy with right abdominal colostomy. Vascular/Lymphatic: Aortic atherosclerosis. No enlarged abdominal or pelvic  lymph nodes. Reproductive: Prostate is unremarkable. Other: Negative for pelvic effusion or free air. Redemonstrated large complex wide neck ventral hernia containing stomach, small bowel colon and mesentery without obstructive features. Chronic skin thickening and mild infiltration of lower abdominal pannus. Musculoskeletal: No acute or suspicious osseous abnormality. IMPRESSION: 1. Limited exam secondary to delayed phase imaging due to technical factors. Ill-defined masslike area at the lower pole of the left kidney, better seen on the prior exam,, measures about 5.6 cm compared with 4.8 cm previously and remains concerning for renal neoplasm/malignancy. Increased perinephric stranding and fluid as well as hazy attenuation at the left renal pelvis and hilus, superimposed upper urinary tract infection is possible. There is no hydronephrosis. 2. Redemonstrated large complex wide neck ventral hernia containing stomach, small bowel and colon without obstructive features. 3. Chronic left pleural effusion and partially visualized consolidation in left lower lobe suggestive of round atelectasis. 4. Possible surface nodularity of liver as may be seen with cirrhosis. 5. Aortic atherosclerosis. Aortic Atherosclerosis (ICD10-I70.0). Electronically Signed   By: Luke Bun M.D.   On: 11/01/2023 18:24   DG Chest 2 View Result Date: 11/01/2023 CLINICAL DATA:  Shortness of breath. EXAM: CHEST - 2 VIEW COMPARISON:  February 09, 2022. FINDINGS: Stable cardiomegaly. Left-sided pacemaker is unchanged. Right lung is clear. Mild left basilar atelectasis and pleural effusion is noted. Bony thorax is unremarkable.  IMPRESSION: Mild left basilar atelectasis and associated pleural effusion. Electronically Signed   By: Lynwood Landy Raddle M.D.   On: 11/01/2023 14:07     Medications Ordered in the ED  furosemide  (LASIX ) injection 40 mg (40 mg Intravenous Given 11/01/23 1923)  iohexol  (OMNIPAQUE ) 300 MG/ML solution 80 mL (80 mLs Intravenous Contrast Given 11/01/23 1735)                                    Medical Decision Making Amount and/or Complexity of Data Reviewed Labs: ordered. Radiology: ordered.  Risk Prescription drug management.   Patient presents to the ED for concern of hematuria, shob, elevated labs obtained 4 days ago, this involves an extensive number of treatment options, and is a complaint that carries with it a high risk of complications and morbidity.  The differential diagnosis includes UTI, kidney stone, neoplasm, obstruction, symptomatic anemia, fluid overload, electrolyte abnormality, COPD exacerbation   Co morbidities that complicate the patient evaluation  See HPI   Additional history obtained:  Additional history obtained from Nursing and Outside Medical Records   External records from outside source obtained and reviewed including triage note, urology note from 09/05/2023   Lab Tests:  I Ordered, and personally interpreted labs.  The pertinent results include:   BNP 361 Hgb 9.9 Creatinine 1.85 CBG 165 BUN 26 UA positive for Hgb and does not appear infectious   Imaging Studies ordered:  I ordered imaging studies including chest x-ray, CT abdomen pelvis I independently visualized and interpreted imaging which showed  Mild left basilar atelectasis and associated pleural effusion  Limited exam secondary to delayed phase imaging due to technical factors. Ill-defined masslike area at the lower pole of the left kidney, better seen on the prior exam,, measures about 5.6 cm compared with 4.8 cm previously and remains concerning for renal neoplasm/malignancy. Increased  perinephric stranding and fluid as well as hazy attenuation at the left renal pelvis and hilus, superimposed upper urinary tract infection is possible. There is no hydronephrosis. Redemonstrated large complex wide  neck ventral hernia containing stomach, small bowel and colon without obstructive features. Chronic left pleural effusion and partially visualized consolidation in left lower lobe suggestive of round atelectasis. Possible surface nodularity of liver as may be seen with cirrhosis I agree with the radiologist interpretation   Cardiac Monitoring:  The patient was maintained on a cardiac monitor.  I personally viewed and interpreted the cardiac monitored which showed an underlying rhythm of: Paced rhythm   Medicines ordered and prescription drug management:  I ordered medication including Lasix  for diuresis Reevaluation of the patient after these medicines showed that the patient improved I have reviewed the patients home medicines and have made adjustments as needed    Consultations Obtained:  I requested consultation with urology Dr. Twylla,  and discussed lab and imaging findings as well as pertinent plan - they recommend:  Stop Eliquis  for active hematuria Follow out outpatient with [facility] next week (per urology standpoint)   Problem List / ED Course:  Hematuria Hgb normally 11.1-12.7 over past 6 months with known hx of chronic anemia likely 2/2 CKD.  Has dropped from 11.2-9.9 over past 4 days Is hemodynamically stable with no tachycardia nor hypotension Is compliant with Eliquis  Is complaint with iron. Told patient to continue taking iron to ensure no worsening anemia CT shows 1 cm increase of masslike area at lower pole of left kidney with concerns for renal neoplasm or malignancy.  No obvious bladder abnormality nor ureteral stone causing hematuria Instructed patient to stop Eliquis  for next 2 days to stop acute bleeding and have him follow-up with urology. Follows  Dr. Roseann and can follow-up with him outpatient early next week Will need to follow-up with nephrology regarding mass on kidney however is in process of obtaining new nephrologist  [facility] Last echo 2023 shows LVEF 70-75% Compliant with torsemide 40 mg twice daily Mild worsening swelling of bilateral lower extremities.  Left continues to be greater than right per his baseline On 2 L per baseline O2 saturation Chest x-ray notable for mild left basilar atelectasis and pleural effusion. CT shows unchanged chronic left sided effusion Resp panel neg No chest pain.  Low suspicion for ACS EKG paced but no obvious change from last tracing or signs of ACS Mild anemia and increased fluid likely contributing to [facility] trial of moving from bed to wheelchair O2 sats 92-96% with no complaints of SHOB nor physical signs of resp distress Discussed with patient to check oxygen  saturations at home and use oxygen  as needed.  He does have a pulse ox. Will provide 1 dose of Lasix  40 mg IV as he takes torsemide 40mg  BID at home Discussed continuing compliance with Lasix  doses Strict return precautions told to patient and provided on DC paperwork   Reevaluation:  After the interventions noted above, I reevaluated the patient and found that they have :stayed the same   Social Determinants of Health:  Former tobacco and EtOH abuse Has PCP and urology follow-up   Dispostion:  After consideration of the diagnostic results and the patients response to treatment, I feel that the patent would benefit from outpatient management with follow-up with urology.   Discussed ED workup, disposition, return to ED precautions with patient who expresses understanding agrees with plan.  All questions answered to their satisfaction.  They are agreeable to plan.  Discharge instructions provided on paperwork  Final diagnoses:  Hematuria, unspecified type  Anemia, unspecified type  Dyspnea, unspecified type     ED Discharge Orders  None        Minnie Tinnie BRAVO, PA 11/01/23 2030    Cottie Donnice PARAS, MD 11/02/23 [phone]

## 2023-11-01 NOTE — ED Notes (Signed)
 Patient d/c with home care instructions. Family at bedside. Patient d/c by Dena, RN

## 2023-11-02 LAB — MULTIPLE MYELOMA PANEL, SERUM
Albumin SerPl Elph-Mcnc: 3.4 g/dL (ref 2.9–4.4)
Albumin/Glob SerPl: 1.1 (ref 0.7–1.7)
Alpha 1: 0.2 g/dL (ref 0.0–0.4)
Alpha2 Glob SerPl Elph-Mcnc: 0.9 g/dL (ref 0.4–1.0)
B-Globulin SerPl Elph-Mcnc: 1 g/dL (ref 0.7–1.3)
Gamma Glob SerPl Elph-Mcnc: 0.9 g/dL (ref 0.4–1.8)
Globulin, Total: 3.1 g/dL (ref 2.2–3.9)
IgA: 280 mg/dL (ref 61–437)
IgG (Immunoglobin G), Serum: 1011 mg/dL (ref 603–1613)
IgM (Immunoglobulin M), Srm: 60 mg/dL (ref 15–143)
Total Protein ELP: 6.5 g/dL (ref 6.0–8.5)

## 2023-11-04 ENCOUNTER — Encounter: Payer: Self-pay | Admitting: Urology

## 2023-11-04 ENCOUNTER — Encounter: Payer: Self-pay | Admitting: Internal Medicine

## 2023-11-04 ENCOUNTER — Ambulatory Visit: Admitting: Internal Medicine

## 2023-11-04 VITALS — BP 124/70 | HR 73 | Ht 72.0 in

## 2023-11-04 DIAGNOSIS — E119 Type 2 diabetes mellitus without complications: Secondary | ICD-10-CM | POA: Diagnosis not present

## 2023-11-04 DIAGNOSIS — N1832 Chronic kidney disease, stage 3b: Secondary | ICD-10-CM

## 2023-11-04 DIAGNOSIS — E118 Type 2 diabetes mellitus with unspecified complications: Secondary | ICD-10-CM | POA: Diagnosis not present

## 2023-11-04 DIAGNOSIS — E1165 Type 2 diabetes mellitus with hyperglycemia: Secondary | ICD-10-CM | POA: Diagnosis not present

## 2023-11-04 DIAGNOSIS — Z933 Colostomy status: Secondary | ICD-10-CM | POA: Diagnosis not present

## 2023-11-04 DIAGNOSIS — Z794 Long term (current) use of insulin: Secondary | ICD-10-CM

## 2023-11-04 DIAGNOSIS — E1122 Type 2 diabetes mellitus with diabetic chronic kidney disease: Secondary | ICD-10-CM

## 2023-11-04 DIAGNOSIS — K56609 Unspecified intestinal obstruction, unspecified as to partial versus complete obstruction: Secondary | ICD-10-CM | POA: Diagnosis not present

## 2023-11-04 DIAGNOSIS — E1142 Type 2 diabetes mellitus with diabetic polyneuropathy: Secondary | ICD-10-CM | POA: Diagnosis not present

## 2023-11-04 MED ORDER — TIRZEPATIDE 10 MG/0.5ML ~~LOC~~ SOAJ: 10.0000 mg | SUBCUTANEOUS | 3 refills | Status: DC

## 2023-11-04 NOTE — Patient Instructions (Addendum)
 Increased Moujaro  10 mg weekly Enter 8 grams  of carbohydrates with each meal   -HOW TO TREAT LOW BLOOD SUGARS (Blood sugar LESS THAN 70 MG/DL) Please follow the RULE OF 15 for the treatment of hypoglycemia treatment (when your (blood sugars are less than 70 mg/dL)   STEP 1: Take 15 grams of carbohydrates when your blood sugar is low, which includes:  3-4 GLUCOSE TABS  OR 3-4 OZ OF JUICE OR REGULAR SODA OR ONE TUBE OF GLUCOSE GEL    STEP 2: RECHECK blood sugar in 15 MINUTES STEP 3: If your blood sugar is still low at the 15 minute recheck --> then, go back to STEP 1 and treat AGAIN with another 15 grams of carbohydrates.

## 2023-11-04 NOTE — Progress Notes (Unsigned)
 Name: Jesse [name]  MRN/ DOB: [id number], Dec 10, 1950   Age/ Sex: 73 y.o., male    PCP: Ngetich, Roxan BROCKS, NP   Reason for Endocrinology Evaluation: Type 2 Diabetes Mellitus     Date of Initial Endocrinology Visit: 10/01/2022    PATIENT IDENTIFIER: Jesse [name] is a 73 y.o. male with a past medical history of DM, OSA, chronic respiratory failure with hypoxia, CKD, CHF, paroxysmal A-fib.  And RCC ( Dx 2024) .The patient presented for initial endocrinology clinic visit on 10/01/2022 for consultative assistance with his diabetes management.    HPI: Jesse [name] was    Diagnosed with DM age 37 Prior Medications tried/Intolerance: Metformin  - no intolerant. Glimepiride - hypoglycemia         Hemoglobin A1c has ranged from 5.7% in 2023, peaking at 10.1% in 2024.  Lives in independent living    Patient was hospitalized 09/24/2022 for small bowel obstruction, of note the patient has a history of ostomy placement after ischemic transverse colon resection 2017   Patient with left abdominal hernia    Patient with congenital left foot deformity, requiring metatarsal and heel surgery Patient has noted upper extremity fatigue that is chronic in nature   On his initial visit to our clinic he had an A1c of 10.1%, he was on Jardiance , Victoza , and Lantus , I started him on glipizide , and referred him for insulin  pump training  He was started on insulin  pump 10/2022  24-hour urinary cortisol was elevated at 60.3 mcg 12/2022 , but his dexamethasone  suppression test came back normal at 1.50 through  the home nurse   He was off Jardiance  2024 with recurrent UTI's   SUBJECTIVE:   During the last visit (05/03/2023): A1c 7.5%  Today (11/04/23): Jesse [name] is here for follow-up on diabetes management.  He checks his  blood sugars multiple times daily. The patient has not  had hypoglycemic episodes since the last clinic visit.  Patient continues to follow-up with cardiology for  A-fib, CHF He is s/p left renal cryoablation 11/2022 for RCC, followed by SBRT which was completed in April, 2025, patient continues to follow-up with [facility] Patient presented to the ED on 11/01/2023 for hematuria, imaging revealed 1 cm masslike area at the left lower pole of the kidney, concerning for malignancy or neoplasm.  He was instructed to stop Eliquis  for 2 days Patient was seen by the hematology clinic for iron deficiency anemia He was recently treated for UTI with Cipro   He has had a low grade fever for 36 hrs at the time  No nausea or vomiting  No changes in stool consistency while on mounjaro        This patient with type 2 diabetes is treated with Omnipod (insulin  pump). During the visit the pump basal and bolus doses were reviewed including carb/insulin  rations and supplemental doses. The clinical list was updated. The glucose meter download was reviewed in detail to determine if the current pump settings are providing the best glycemic control without excessive hypoglycemia.  Pump and meter download:    Pump   Omnipod  Settings   Insulin  type   Novolog     Basal rate       0000 1.7 u/h               I:C ratio       0000 1:1                   Sensitivity  0000  25      Goal       0000  110            Type & Model of Pump: OmniPod Insulin  Type: Currently using NovoLog .  Body mass index is 54.25 kg/m.  PUMP STATISTICS: No data     HOME DIABETES REGIMEN: Mounjaro  5 mg weekly NovoLog    Statin: no ACE-I/ARB: no   CONTINUOUS GLUCOSE MONITORING RECORD INTERPRETATION    Dates of Recording: 3/1-3/14/2025  Sensor description: Dexcom  Results statistics:   CGM use % of time 92.7  Average and SD 208/51  Time in range  35%  % Time Above 180 44  % Time above 250 21  % Time Below target 0   Glycemic patterns summary: BG's trend down overnight and fluctuate throughout the day  Hyperglycemic episodes  postprandial  Hypoglycemic episodes occurred N/A  Overnight periods: Optimal   DIABETIC COMPLICATIONS: Microvascular complications:  CKD III, neuropathy, minimal DR  Denies: retinopathy Last eye exam: Completed 05/2022  Macrovascular complications:   Denies: CAD, PVD, CVA   PAST HISTORY: Past Medical History:  Past Medical History:  Diagnosis Date   Alcohol use disorder in remission 1999   Anemia    iron deficiency   Anxiety and depression    Arthritis    ankles, knees and hands   At high risk for falls    Basal cell carcinoma    x4 (on both shoulders, one on forehead, one on nose)   CKD stage 3b, GFR 30-44 ml/min (HCC)    Former cigarette smoker 1999   Generalized anxiety disorder    Hard of hearing    Headache    History of kidney stones    Hyperlipidemia due to type 2 diabetes mellitus (HCC)    Hypertension    Morbid obesity (HCC)    Neuropathy    O2 dependent    2L   Presence of permanent cardiac pacemaker    Renal cell carcinoma of left kidney (HCC)    pt had cryotherapy for this at [facility] 12/11/22)   Risk for falls    Sleep apnea    uses CPAP   TIA (transient ischemic attack)    pt states this was not confirmed due to MRI unable to be performed   Type 2 diabetes mellitus (HCC)    Ventral hernia with bowel obstruction    Per Hospital Encounter on 05/21/22   Past Surgical History:  Past Surgical History:  Procedure Laterality Date   APPENDECTOMY  1980   BREAST SURGERY Left    removed part of breast due to gynecomastia   COLOSTOMY  1989   and reversal same year   COLOSTOMY  2017   FINGER ARTHRODESIS Right 02/04/2023   Procedure: RIGHT INDEX FINGER PROXIMAL INTERPHALANGEAL JOINT ARTHRODESIS;  Surgeon: [name], MD;  Location: [facility] OR;  Service: Orthopedics;  Laterality: Right;   IR THORACENTESIS ASP PLEURAL SPACE W/IMG GUIDE  05/18/2021   IR THORACENTESIS ASP PLEURAL SPACE W/IMG GUIDE  10/06/2021   KNEE SURGERY Right    NASAL SEPTUM SURGERY   1982   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   Procedure: PACEMAKER IMPLANT;  Surgeon: [name], MD;  Location: [facility] INVASIVE CV LAB;  Service: Cardiovascular;  Laterality: N/A;   RENAL CRYOABLATION  12/11/2022   VARICOCELECTOMY  1983    Social History:  reports that he quit smoking about 26 years ago. His smoking use included cigarettes. He has never used smokeless tobacco. He  reports that he does not currently use alcohol. He reports that he does not use drugs. Family History:  Family History  Problem Relation Age of Onset   Diabetes Brother    Bipolar disorder Daughter    Anxiety disorder Son      HOME MEDICATIONS: Allergies as of 11/04/2023       Reactions   Tape Rash   If left on for a long time        Medication List        Accurate as of November 04, 2023 11:20 AM. If you have any questions, ask your nurse or doctor.          acetaminophen  325 MG tablet Commonly known as: Tylenol  Take 2 tablets (650 mg total) by mouth every 6 (six) hours.   alfuzosin  10 MG 24 hr tablet Commonly known as: UROXATRAL  TAKE 1 TABLET BY MOUTH EVERY DAY   ALPRAZolam  0.5 MG tablet Commonly known as: XANAX  TAKE 1 TABLET(0.5 MG) BY MOUTH TWICE DAILY AS NEEDED FOR ANXIETY OR SLEEP   B-12 PO Take 1 tablet by mouth daily.   CENTRUM ADULT PO Take 1 tablet by mouth daily at 6 (six) AM.   Dexcom G7 Sensor Misc APPLY 1 SENSOR TO SKIN EVERY 10 DAYS AS DIRECTED   diclofenac  Sodium 1 % Gel Commonly known as: VOLTAREN  Apply 2 g topically 4 (four) times daily. Apply to your right neck and shoulder area 3-4 times a day as needed   DULoxetine  60 MG capsule Commonly known as: CYMBALTA  TAKE 1 CAPSULE(60 MG) BY MOUTH DAILY   Eliquis  5 MG Tabs tablet Generic drug: apixaban  TAKE 1 TABLET(5 MG) BY MOUTH TWICE DAILY   Gemtesa  75 MG Tabs Generic drug: Vibegron  Take 1 tablet (75 mg total) by mouth daily.   guaiFENesin  600 MG 12 hr tablet Commonly known as: Mucinex  Take 1 tablet (600  mg total) by mouth 2 (two) times daily.   HYDROcodone -acetaminophen  5-325 MG tablet Commonly known as: NORCO/VICODIN Take 1 tablet by mouth at bedtime as needed for moderate pain (pain score 4-6).   insulin  aspart 100 UNIT/ML injection Commonly known as: novoLOG  Max daily 100 units   IRON PO Take 2 tablets by mouth daily.   isosorbide  mononitrate 60 MG 24 hr tablet Commonly known as: IMDUR  TAKE 1 TABLET(60 MG) BY MOUTH DAILY   ketoconazole  2 % cream Commonly known as: NIZORAL  APPLY TOPICALLY TO THE AFFECTED AREA AS NEEDED FOR IRRITATION   methocarbamol  500 MG tablet Commonly known as: ROBAXIN  TAKE 1 TABLET(500 MG) BY MOUTH EVERY 8 HOURS AS NEEDED FOR MUSCLE SPASMS   metolazone  2.5 MG tablet Commonly known as: ZAROXOLYN  Take 1 tablet (2.5 mg total) by mouth daily. Take 1-2 times a week as needed , spacing out 3 days between doses   ondansetron  4 MG tablet Commonly known as: ZOFRAN  TAKE 1 TABLET(4 MG) BY MOUTH EVERY 8 HOURS AS NEEDED FOR NAUSEA OR VOMITING What changed: See the new instructions.   OXYGEN  Inhale 2 L into the lungs continuous.   pantoprazole  40 MG tablet Commonly known as: PROTONIX  Take 1 tablet (40 mg total) by mouth 2 (two) times daily.   potassium chloride  SA 20 MEQ tablet Commonly known as: KLOR-CON  M Take 1 tablet (20 mEq total) by mouth 2 (two) times daily.   Red Yeast Rice 600 MG Caps Take 1,200 mg by mouth daily.   rOPINIRole  2 MG tablet Commonly known as: REQUIP  TAKE 1 TABLET(2 MG) BY MOUTH AT BEDTIME  rosuvastatin  10 MG tablet Commonly known as: CRESTOR  Take 2 tablets (20 mg total) by mouth daily.   saccharomyces boulardii 250 MG capsule Commonly known as: FLORASTOR Take 1 capsule (250 mg total) by mouth 2 (two) times daily.   tirzepatide  7.5 MG/0.5ML Pen Commonly known as: MOUNJARO  Inject 7.5 mg into the skin once a week.   tiZANidine  2 MG tablet Commonly known as: ZANAFLEX  TAKE 1 TABLET BY MOUTH TWICE DAILY   topiramate  25  MG tablet Commonly known as: TOPAMAX  TAKE 1 TABLET BY MOUTH EVERY DAY   torsemide 100 MG tablet Commonly known as: DEMADEX Take 100 mg by mouth daily. Patient takes 1/2 tablet in the morning and 1/2 tablet in the afternoon   traMADol  50 MG tablet Commonly known as: ULTRAM  TAKE 1 TABLET(50 MG) BY MOUTH TWICE DAILY AS NEEDED What changed: See the new instructions.   traZODone  50 MG tablet Commonly known as: DESYREL  TAKE 2 TABLETS(100 MG) BY MOUTH AT BEDTIME   triamcinolone  cream 0.1 % Commonly known as: KENALOG  Apply 1 Application topically as needed.   Vitamin D  (Ergocalciferol ) 1.25 MG (50000 UNIT) Caps capsule Commonly known as: DRISDOL  TAKE 1 CAPSULE BY MOUTH EVERY 7 DAYS   Vitamin D3 Super Strength 50 MCG (2000 UT) Tabs Generic drug: Cholecalciferol Take 2,000 Units by mouth daily.         ALLERGIES: Allergies  Allergen Reactions   Tape Rash    If left on for a long time     REVIEW OF SYSTEMS: A comprehensive ROS was conducted with the patient and is negative    OBJECTIVE:   VITAL SIGNS: BP 124/70 (BP Location: Left Wrist, Patient Position: Sitting, Cuff Size: Large)   Pulse 73   Ht 6' (1.829 m)   SpO2 97%   BMI 54.25 kg/m    PHYSICAL EXAM:  General: Pt appears well and is in NAD Patient is in a wheelchair  Lungs: Clear with good BS bilat   Heart: RRR   Neuro: MS is good with appropriate affect, pt is alert and Ox3    DM foot exam: 10/01/2022  B/L feet deformity noted  The pedal pulses are 1+ on right and 1+ on left. The sensation is decrease  to a screening 5.07, 10 gram monofilament bilaterally    DATA REVIEWED:  Lab Results  Component Value Date   HGBA1C 7.2 (H) 10/15/2023   HGBA1C 7.1 (A) 05/03/2023   HGBA1C 7.5 (A) 01/03/2023    Latest Reference Range & Units 11/01/23 16:11  Sodium 135 - 145 mmol/L 138  Potassium 3.5 - 5.1 mmol/L 4.0  Chloride 98 - 111 mmol/L 102  CO2 22 - 32 mmol/L 25  Glucose 70 - 99 mg/dL 834 (H)  BUN 8 -  23 mg/dL 26 (H)  Creatinine 9.38 - 1.24 mg/dL 8.14 (H)  Calcium  8.9 - 10.3 mg/dL 8.8 (L)  Anion gap 5 - 15  12    Latest Reference Range & Units 11/01/23 16:11  GFR, Estimated >60 mL/min 38 (L)  (L): Data is abnormally low   ASSESSMENT / PLAN / RECOMMENDATIONS:   1) Type 2 Diabetes Mellitus, optimally  controlled, With CKD III , neuropathic and retinopathic complications - Most recent A1c of 7.2 %. Goal A1c < 7.0 %.     -A1c continues to trend down without hypoglycemia -We have not been able to download his OmniPod again today -He does not bolus with meals, hence postprandial hyperglycemia -He is not on Jardiance , I suspect due to  recurrent and chronic UTI/prostatitis -We had to switch Victoza  to Mounjaro  due to manufacturer supply issues, he is tolerating Mounjaro ,  will increase Mounjaro  as below   MEDICATIONS:  Increase Mounjaro  7.5 mg weekly    Pump   Omnipod  Settings   Insulin  type   Novolog     Basal rate       0000 1.7 u/h    0800 1.8          I:C ratio       0000 1:1     Enter #5 g with each meal              Sensitivity       0000  20      Goal       0000  110         EDUCATION / INSTRUCTIONS: BG monitoring instructions: Patient is instructed to check his blood sugars 3 times a day, before meals. Call [facility] if: BG persistently < 70  I reviewed the Rule of 15 for the treatment of hypoglycemia in detail with the patient. Literature supplied.   2) Diabetic complications:  Eye: Does  have known diabetic retinopathy.  Neuro/ Feet: Does  have known diabetic peripheral neuropathy. Renal: Patient does  have known baseline CKD.        Follow-up in 6 months  I spent 25 minutes preparing to see the patient by review of recent labs, imaging and procedures, obtaining and reviewing separately obtained history, communicating with the patient, ordering medications, tests or procedures, and documenting clinical information in the EHR  including the differential Dx, treatment, and any further evaluation and other management   Signed electronically by: Stefano Redgie Butts, MD  [facility]  [facility] [address] [place], [place] [address] Phone: [phone] FAX: [phone]   CC: [name], NP [address] [place] [address] Phone: [phone]  Fax: [phone]    Return to Endocrinology clinic as below: Future Appointments  Date Time Provider Department Center  11/11/2023  3:45 PM Pasam, Chinita, MD [facility] None  11/15/2023  2:30 PM Little, [name], RN [facility] None  11/18/2023  3:00 PM Ezzard Remak D, LCSW [facility] None  12/09/2023 10:30 AM Roseann Adine PARAS., MD [facility] None  12/13/2023  7:35 AM [facility] HVT DEVICE REMOTES [facility] H&V  01/27/2024  3:15 PM [facility] PHLEBOTOMIST [facility] None  01/27/2024  3:45 PM Pasam, Avinash, MD [facility] None  03/13/2024  7:35 AM [facility] HVT DEVICE REMOTES [facility] H&V  04/21/2024 10:20 AM Ngetich, Roxan BROCKS, NP [facility] None  07/07/2024 11:00 AM Ngetich, Roxan BROCKS, NP [facility] None

## 2023-11-05 ENCOUNTER — Encounter: Payer: Self-pay | Admitting: Internal Medicine

## 2023-11-06 DIAGNOSIS — N1832 Chronic kidney disease, stage 3b: Secondary | ICD-10-CM | POA: Diagnosis not present

## 2023-11-06 DIAGNOSIS — R600 Localized edema: Secondary | ICD-10-CM | POA: Diagnosis not present

## 2023-11-06 DIAGNOSIS — I509 Heart failure, unspecified: Secondary | ICD-10-CM | POA: Diagnosis not present

## 2023-11-06 DIAGNOSIS — D631 Anemia in chronic kidney disease: Secondary | ICD-10-CM | POA: Diagnosis not present

## 2023-11-06 DIAGNOSIS — E1122 Type 2 diabetes mellitus with diabetic chronic kidney disease: Secondary | ICD-10-CM | POA: Diagnosis not present

## 2023-11-06 DIAGNOSIS — C642 Malignant neoplasm of left kidney, except renal pelvis: Secondary | ICD-10-CM | POA: Diagnosis not present

## 2023-11-06 DIAGNOSIS — I130 Hypertensive heart and chronic kidney disease with heart failure and stage 1 through stage 4 chronic kidney disease, or unspecified chronic kidney disease: Secondary | ICD-10-CM | POA: Diagnosis not present

## 2023-11-11 ENCOUNTER — Encounter: Payer: Self-pay | Admitting: Oncology

## 2023-11-11 ENCOUNTER — Inpatient Hospital Stay (HOSPITAL_BASED_OUTPATIENT_CLINIC_OR_DEPARTMENT_OTHER): Admitting: Oncology

## 2023-11-11 DIAGNOSIS — D631 Anemia in chronic kidney disease: Secondary | ICD-10-CM

## 2023-11-11 DIAGNOSIS — N1832 Chronic kidney disease, stage 3b: Secondary | ICD-10-CM

## 2023-11-11 NOTE — Progress Notes (Signed)
 [facility]  HEMATOLOGY-ONCOLOGY ELECTRONIC VISIT PROGRESS NOTE  PATIENT NAME: Jesse [name]   MR#: [id number] DOB: [birth year]  DATE OF SERVICE: 11/11/2023  Patient Care Team: Ngetich, Roxan BROCKS, NP as PCP - General (Family Medicine) [name], MD (Inactive) as PCP - Cardiology (Cardiology) [name], MD as PCP - Electrophysiology (Cardiology) [name], MD as Consulting Physician (Orthopedic Surgery) Morgan, [name], RN as [facility] Care Management  I connected with the patient via telephone conference and verified that I am speaking with the correct person using two identifiers. The patient's location is at home and I am providing care from the [facility].  I discussed the limitations, risks, security and privacy concerns of performing an evaluation and management service by e-visits and the availability of in person appointments. I also discussed with the patient that there may be a patient responsible charge related to this service. The patient expressed understanding and agreed to proceed.   ASSESSMENT & PLAN:   [name] is a 73 y.o. 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    Anemia in chronic kidney disease Chronic anemia with hemoglobin levels ranging from 9 to 12.7 since 2023, likely due to decreased erythropoietin production from chronic kidney disease.  On his consultation with us  on 10/28/2023, labs showed stable hemoglobin of 11.2, MCV 94.6.  White count and platelet count were within normal limits.  Creatinine 2.41.  Iron studies showed no evidence of iron  deficiency.  B12, folate, ferritin, LDH, haptoglobin are within normal limits.  SPEP, IFE, quantitative immunoglobulins were all unremarkable.  No dense of monoclonal gammopathy.  Both serum free kappa and serum free lambda were elevated, with normal ratio, consistent with CKD picture.  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  If hemoglobin is consistently below 9, then we will consider ESA.  I discussed the assessment and treatment plan with the patient. The patient was provided an opportunity to ask questions and all were answered. The patient agreed with the plan and demonstrated an understanding of the instructions. The patient was advised to call back or seek an in-person evaluation if the symptoms worsen or if the condition fails to improve as anticipated.    I spent 12 minutes over the phone with the patient reviewing test results, discuss management and coordination/planning of care.  Chinita Patten, MD 11/11/2023 4:28 PM [facility] DE[facility] OF [facility] [address] [place] [address] Dept: [phone] Dept Fax: [phone]   INTERVAL HISTORY:  Please see above for problem oriented charting.  The purpose of today's discussion is to explain recent lab results and to formulate plan of care.  Discussed the use of AI scribe software for clinical note transcription with the patient, who gave verbal consent to proceed.  History of Present Illness MAKHI [name] is a 73 year old male with chronic kidney disease who presents with anemia.  His kidney function has been fluctuating, with creatinine levels noted to be 2.41 on September 8th and improved to 1.85 on September 12th. He is currently taking two iron  pills a day.  Multiple myeloma workup was negative, and iron studies, B12, and folic acid  levels were normal.  He recently saw his nephrologist and is  scheduled for follow-up on December 8th.    SUMMARY OF HEMATOLOGY HISTORY:  He was referred by his primary doctor for evaluation of anemia and fatigue.   He has experienced anemia and fatigue over the past year, with hemoglobin levels consistently at or below the threshold for anemia. His daughter is concerned about his lab results, which often show abnormalities. Despite these concerns, previous providers have not taken action until his nurse recommended seeing a hematologist.   He has a history of kidney cancer on the left side, initially treated with cryoablation in October and followed by radiation in April due to incomplete treatment or regrowth. A recent CT scan without contrast showed no growth, and he is scheduled for a six-month follow-up.   He experiences chronic urinary tract infections (UTIs), with recent symptoms of dark urine and pain after completing a seven-day course of Cipro . He has a history of needing multiple rounds of antibiotics due to anatomical issues causing bladder pressure.   He has been on oxygen  for over three years, possibly due to anatomical issues from a massive hernia affecting lung expansion. He denies having COPD and can manage short periods without oxygen , but experiences significant shortness of breath with physical activity.   He takes two iron tablets daily as advised by his nephrologist and tolerates them well. He denies any obvious blood loss, although he experienced a rare episode of mucosal bleeding over the weekend.   He underwent emergency surgery in 2017 for a gangrenous colon, resulting in a colostomy. He reports difficulty maintaining the ostomy due to its location, requiring frequent changes.  Chronic anemia with hemoglobin levels ranging from 9 to 12.7 since 2023, likely due to decreased erythropoietin production from chronic kidney disease.  On his consultation with us  on 10/28/2023, labs showed stable hemoglobin of 11.2, MCV 94.6.  White count  and platelet count were within normal limits.  Creatinine 2.41.  Iron studies showed no evidence of iron deficiency.  B12, folate, ferritin, LDH, haptoglobin are within normal limits.  SPEP, IFE, quantitative immunoglobulins were all unremarkable.  No dense of monoclonal gammopathy.  Both serum free kappa and serum free lambda were elevated, with normal ratio, consistent with CKD picture.  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  If hemoglobin is consistently below 9, then we will consider ESA.  REVIEW OF SYSTEMS:    Review of Systems - Oncology  All other pertinent systems were reviewed with the patient and are negative.  I have reviewed the past medical history, past surgical history, social history and family history with the patient and they are unchanged from previous note.  ALLERGIES:  He is allergic to tape.  MEDICATIONS:  Current Outpatient Medications  Medication Sig Dispense Refill   acetaminophen  (TYLENOL ) 325 MG tablet Take 2 tablets (650 mg total) by mouth every 6 (six) hours.     alfuzosin  (UROXATRAL ) 10 MG 24 hr tablet TAKE 1 TABLET BY MOUTH EVERY DAY 90 tablet 1   ALPRAZolam  (XANAX ) 0.5 MG tablet TAKE 1 TABLET(0.5 MG) BY MOUTH TWICE DAILY AS NEEDED FOR ANXIETY OR SLEEP 60 tablet 0   Cholecalciferol (VITAMIN D3 SUPER STRENGTH) 50 MCG (2000 UT) TABS Take 2,000 Units by mouth daily.     Continuous Glucose Sensor (DEXCOM G7 SENSOR) MISC APPLY 1 SENSOR TO SKIN EVERY  10 DAYS AS DIRECTED 9 each 3   Cyanocobalamin  (B-12 PO) Take 1 tablet by mouth daily.     diclofenac  Sodium (VOLTAREN ) 1 % GEL Apply 2 g topically 4 (four) times daily. Apply to your right neck and shoulder area 3-4 times a day as needed 350 g 1   DULoxetine  (CYMBALTA ) 60 MG capsule TAKE 1 CAPSULE(60 MG) BY MOUTH DAILY 90 capsule 1   ELIQUIS  5 MG TABS tablet TAKE 1 TABLET(5 MG) BY MOUTH TWICE DAILY 60 tablet 5   Ferrous Sulfate  (IRON PO) Take 2 tablets by mouth daily.      guaiFENesin  (MUCINEX ) 600 MG 12 hr tablet Take 1 tablet (600 mg total) by mouth 2 (two) times daily. 60 tablet 3   HYDROcodone -acetaminophen  (NORCO/VICODIN) 5-325 MG tablet Take 1 tablet by mouth at bedtime as needed for moderate pain (pain score 4-6). 30 tablet 0   insulin  aspart (NOVOLOG ) 100 UNIT/ML injection Max daily 100 units 100 mL 3   isosorbide  mononitrate (IMDUR ) 60 MG 24 hr tablet TAKE 1 TABLET(60 MG) BY MOUTH DAILY 90 tablet 1   ketoconazole  (NIZORAL ) 2 % cream APPLY TOPICALLY TO THE AFFECTED AREA AS NEEDED FOR IRRITATION 15 g 5   methocarbamol  (ROBAXIN ) 500 MG tablet TAKE 1 TABLET(500 MG) BY MOUTH EVERY 8 HOURS AS NEEDED FOR MUSCLE SPASMS 90 tablet 5   metolazone  (ZAROXOLYN ) 2.5 MG tablet Take 1 tablet (2.5 mg total) by mouth daily. Take 1-2 times a week as needed , spacing out 3 days between doses 90 tablet 1   Multiple Vitamins-Minerals (CENTRUM ADULT PO) Take 1 tablet by mouth daily at 6 (six) AM.     ondansetron  (ZOFRAN ) 4 MG tablet TAKE 1 TABLET(4 MG) BY MOUTH EVERY 8 HOURS AS NEEDED FOR NAUSEA OR VOMITING (Patient taking differently: as needed.) 30 tablet 2   OXYGEN  Inhale 2 L into the lungs continuous.     pantoprazole  (PROTONIX ) 40 MG tablet Take 1 tablet (40 mg total) by mouth 2 (two) times daily. 180 tablet 1   potassium chloride  SA (KLOR-CON  M) 20 MEQ tablet Take 1 tablet (20 mEq total) by mouth 2 (two) times daily.     Red Yeast Rice 600 MG CAPS Take 1,200 mg by mouth daily.     rOPINIRole  (REQUIP ) 2 MG tablet TAKE 1 TABLET(2 MG) BY MOUTH AT BEDTIME 90 tablet 3   rosuvastatin  (CRESTOR ) 10 MG tablet Take 2 tablets (20 mg total) by mouth daily. 90 tablet 2   saccharomyces boulardii (FLORASTOR) 250 MG capsule Take 1 capsule (250 mg total) by mouth 2 (two) times daily. 20 capsule 0   tirzepatide  (MOUNJARO ) 10 MG/0.5ML Pen Inject 10 mg into the skin once a week. 6 mL 3   tiZANidine  (ZANAFLEX ) 2 MG tablet TAKE 1 TABLET BY MOUTH TWICE DAILY 180 tablet 1   topiramate  (TOPAMAX ) 25  MG tablet TAKE 1 TABLET BY MOUTH EVERY DAY 90 tablet 2   torsemide (DEMADEX) 100 MG tablet Take 100 mg by mouth daily. Patient takes 1/2 tablet in the morning and 1/2 tablet in the afternoon     traMADol  (ULTRAM ) 50 MG tablet TAKE 1 TABLET(50 MG) BY MOUTH TWICE DAILY AS NEEDED (Patient taking differently: as needed. TAKE 1 TABLET(50 MG) BY MOUTH TWICE DAILY AS NEEDED) 180 tablet 0   traZODone  (DESYREL ) 50 MG tablet TAKE 2 TABLETS(100 MG) BY MOUTH AT BEDTIME 60 tablet 5   triamcinolone  cream (KENALOG ) 0.1 % Apply 1 Application topically as needed.     Vibegron  (  GEMTESA ) 75 MG TABS Take 1 tablet (75 mg total) by mouth daily. 90 tablet 3   Vitamin D , Ergocalciferol , (DRISDOL ) 1.25 MG (50000 UNIT) CAPS capsule TAKE 1 CAPSULE BY MOUTH EVERY 7 DAYS 12 capsule 1   No current facility-administered medications for this visit.    PHYSICAL EXAMINATION:    Onc Performance Status - 11/11/23 1600       ECOG Perf Status   ECOG Perf Status Capable of only limited selfcare, confined to bed or chair more than 50% of waking hours      KPS SCALE   KPS % SCORE Requires occasional assistance but is able to care for most needs          LABORATORY DATA:   I have reviewed the data as listed.  Recent Results (from the past 2160 hours)  Urinalysis, Routine w reflex microscopic     Status: Abnormal   Collection Time: 09/05/23 12:00 AM  Result Value Ref Range   Specific Gravity, UA 1.015 1.005 - 1.030   pH, UA 5.5 5.0 - 7.5   Color, UA Yellow Yellow   Appearance Ur Clear Clear   Leukocytes,UA Negative Negative   Protein,UA 1+ (A) Negative/Trace   Glucose, UA 1+ (A) Negative   Ketones, UA Negative Negative   RBC, UA Negative Negative   Bilirubin, UA Negative Negative   Urobilinogen, Ur 0.2 0.2 - 1.0 mg/dL   Nitrite, UA Negative Negative   Microscopic Examination See below:   Microscopic Examination     Status: None   Collection Time: 09/05/23 12:00 AM   Urine  Result Value Ref Range   WBC, UA  0-5 0 - 5 /hpf   RBC, Urine 0-2 0 - 2 /hpf   Epithelial Cells (non renal) 0-10 0 - 10 /hpf   Bacteria, UA Few None seen/Few  CUP PACEART REMOTE DEVICE CHECK     Status: None   Collection Time: 09/13/23  2:00 AM  Result Value Ref Range   Date Time Interrogation Session [phone]    Pulse Generator Manufacturer SJCR    Pulse Gen Model 2272 Assurity MRI    Pulse Gen Serial Number [id number]    Clinic Name [facility]    Implantable Pulse Generator Type Implantable Pulse Generator    Implantable Pulse Generator Implant Date 79769271    Implantable Lead Manufacturer MERM    Implantable Lead Model 3830 SelectSecure MRI SureScan    Implantable Lead Serial Number [id number]    Implantable Lead Implant Date 79769271    Implantable Lead Location Detail 1 UNKNOWN    Implantable Lead Location [id number]    Implantable Lead Connection Status [id number]    Implantable Lead Manufacturer SJCR    Implantable Lead Model LPA1200M Tendril MRI    Implantable Lead Serial Number [id number]    Implantable Lead Implant Date 79769271    Implantable Lead Location Detail 1 UNKNOWN    Implantable Lead Location [id number]    Implantable Lead Connection Status [id number]    Lead Channel Setting Sensing Sensitivity 2.5 mV   Lead Channel Setting Sensing Adaptation Mode Fixed Pacing    Lead Channel Setting Pacing Amplitude 1.625    Lead Channel Setting Pacing Pulse Width 0.5 ms   Lead Channel Setting Pacing Amplitude 2.5 V   Lead Channel Status NULL    Lead Channel Impedance Value 430 ohm   Lead Channel Sensing Intrinsic Amplitude 3.5 mV   Lead Channel Pacing Threshold Amplitude 0.625 V   Lead Channel Pacing Threshold Pulse Width  0.5 ms   Lead Channel Status NULL    Lead Channel Impedance Value 510 ohm   Lead Channel Sensing Intrinsic Amplitude 12.0 mV   Lead Channel Pacing Threshold Amplitude 0.75 V   Lead Channel Pacing Threshold Pulse Width 0.5 ms   Battery Status MOS    Battery Remaining Longevity 74 mo   Battery  Remaining Percentage 78.0 %   Battery Voltage 2.99 V   Brady Statistic RA Percent Paced 45.0 %   Brady Statistic RV Percent Paced 99.0 %   Brady Statistic AP VP Percent 50.0 %   Brady Statistic AS VP Percent 50.0 %   Brady Statistic AP VS Percent 1.0 %   Brady Statistic AS VS Percent 1.0 %  POC Urinalysis Dipstick     Status: Abnormal   Collection Time: 10/15/23 10:44 AM  Result Value Ref Range   Color, UA     Clarity, UA     Glucose, UA Negative Negative   Bilirubin, UA Negative    Ketones, UA Negative    Spec Grav, UA 1.020 1.010 - 1.025   Blood, UA Postive    pH, UA 5.0 5.0 - 8.0   Protein, UA Positive (A) Negative   Urobilinogen, UA 0.2 0.2 or 1.0 E.U./dL   Nitrite, UA Positive    Leukocytes, UA Small (1+) (A) Negative   Appearance     Odor    Culture, Urine     Status: Abnormal   Collection Time: 10/15/23 10:49 AM   Specimen: Urine  Result Value Ref Range   MICRO NUMBER: [id number]    SPECIMEN QUALITY: Adequate    Sample Source URINE, CLEAN CATCH    STATUS: FINAL    ISOLATE 1: Klebsiella pneumoniae (A)     Comment: Greater than 100,000 CFU/mL of Klebsiella pneumoniae      Susceptibility   Klebsiella pneumoniae - URINE CULTURE, REFLEX    AMOX/CLAVULANIC <=2 Sensitive     AMPICILLIN/SULBACTAM 4 Sensitive     CEFAZOLIN * 2 Not Reportable      * For infections other than uncomplicated UTI caused by E. coli, K. pneumoniae or P. mirabilis: Cefazolin  is resistant if MIC > or = 8 mcg/mL. (Distinguishing susceptible versus intermediate for isolates with MIC < or = 4 mcg/mL requires additional testing.) For uncomplicated UTI caused by E. coli, K. pneumoniae or P. mirabilis: Cefazolin  is susceptible if MIC <32 mcg/mL and predicts susceptible to the oral agents cefaclor, cefdinir, cefpodoxime, cefprozil, cefuroxime, cephalexin and loracarbef.     CEFTAZIDIME <=0.5 Sensitive     CEFEPIME <=0.12 Sensitive     CEFTRIAXONE  <=0.25 Sensitive     CIPROFLOXACIN  <=0.06 Sensitive      LEVOFLOXACIN <=0.12 Sensitive     GENTAMICIN  <=1 Sensitive     IMIPENEM <=0.25 Sensitive     MEROPENEM <=0.25 Sensitive     NITROFURANTOIN 64 Intermediate     PIP/TAZO <=4 Sensitive     TRIMETH/SULFA* <=20 Sensitive      * For infections other than uncomplicated UTI caused by E. coli, K. pneumoniae or P. mirabilis: Cefazolin  is resistant if MIC > or = 8 mcg/mL. (Distinguishing susceptible versus intermediate for isolates with MIC < or = 4 mcg/mL requires additional testing.) For uncomplicated UTI caused by E. coli, K. pneumoniae or P. mirabilis: Cefazolin  is susceptible if MIC <32 mcg/mL and predicts susceptible to the oral agents cefaclor, cefdinir, cefpodoxime, cefprozil, cefuroxime, cephalexin and loracarbef. Legend: S = Susceptible  I = Intermediate R = Resistant  NS = Not  susceptible SDD = Susceptible Dose Dependent * = Not Tested  NR = Not Reported **NN = See Therapy Comments   Lipid panel     Status: None   Collection Time: 10/15/23 11:14 AM  Result Value Ref Range   Cholesterol 130 <200 mg/dL   HDL 50 > OR = 40 mg/dL   Triglycerides 875 <849 mg/dL   LDL Cholesterol (Calc) 59 mg/dL (calc)    Comment: Reference range: <100 . Desirable range <100 mg/dL for primary prevention;   <70 mg/dL for patients with CHD or diabetic patients  with > or = 2 CHD risk factors. SABRA LDL-C is now calculated using the Martin-Hopkins  calculation, which is a validated novel method providing  better accuracy than the Friedewald equation in the  estimation of LDL-C.  Gladis APPLETHWAITE et al. SANDREA. 7986;689(80): 2061-2068  (http://education.[employer].com/faq/FAQ164)    Total CHOL/HDL Ratio 2.6 <5.0 (calc)   Non-HDL Cholesterol (Calc) 80 <869 mg/dL (calc)    Comment: For patients with diabetes plus 1 major ASCVD risk  factor, treating to a non-HDL-C goal of <100 mg/dL  (LDL-C of <29 mg/dL) is considered a therapeutic  option.   TSH     Status: None   Collection Time: 10/15/23  11:14 AM  Result Value Ref Range   TSH 1.90 0.40 - 4.50 mIU/L  CBC with Differential/Platelet     Status: Abnormal   Collection Time: 10/15/23 11:14 AM  Result Value Ref Range   WBC 6.9 3.8 - 10.8 Thousand/uL   RBC 3.66 (L) 4.20 - 5.80 Million/uL   Hemoglobin 11.1 (L) 13.2 - 17.1 g/dL   HCT 64.0 (L) 61.4 - 49.9 %   MCV 98.1 80.0 - 100.0 fL   MCH 30.3 27.0 - 33.0 pg   MCHC 30.9 (L) 32.0 - 36.0 g/dL    Comment: For adults, a slight decrease in the calculated MCHC value (in the range of 30 to 32 g/dL) is most likely not clinically significant; however, it should be interpreted with caution in correlation with other red cell parameters and the patient's clinical condition.    RDW 14.8 11.0 - 15.0 %   Platelets 183 140 - 400 Thousand/uL   MPV 8.8 7.5 - 12.5 fL   Neutro Abs 5,065 1,500 - 7,800 cells/uL   Absolute Lymphocytes 849 (L) 850 - 3,900 cells/uL   Absolute Monocytes 676 200 - 950 cells/uL   Eosinophils Absolute 269 15 - 500 cells/uL   Basophils Absolute 41 0 - 200 cells/uL   Neutrophils Relative % 73.4 %   Total Lymphocyte 12.3 %   Monocytes Relative 9.8 %   Eosinophils Relative 3.9 %   Basophils Relative 0.6 %  Hemoglobin A1c     Status: Abnormal   Collection Time: 10/15/23 11:14 AM  Result Value Ref Range   Hgb A1c MFr Bld 7.2 (H) <5.7 %    Comment: For someone without known diabetes, a hemoglobin A1c value of 6.5% or greater indicates that they may have  diabetes and this should be confirmed with a follow-up  test. . For someone with known diabetes, a value <7% indicates  that their diabetes is well controlled and a value  greater than or equal to 7% indicates suboptimal  control. A1c targets should be individualized based on  duration of diabetes, age, comorbid conditions, and  other considerations. . Currently, no consensus exists regarding use of hemoglobin A1c for diagnosis of diabetes for children. .    Mean Plasma Glucose 160 mg/dL  eAG (mmol/L) 8.9  mmol/L  Magnesium     Status: Abnormal   Collection Time: 10/15/23 11:14 AM  Result Value Ref Range   Magnesium 2.6 (H) 1.5 - 2.5 mg/dL  Comprehensive metabolic panel with GFR     Status: Abnormal   Collection Time: 10/15/23 11:14 AM  Result Value Ref Range   Glucose, Bld 172 (H) 65 - 99 mg/dL    Comment: .            Fasting reference interval . For someone without known diabetes, a glucose value >125 mg/dL indicates that they may have diabetes and this should be confirmed with a follow-up test. .    BUN 25 7 - 25 mg/dL   Creat 8.17 (H) 9.29 - 1.28 mg/dL   eGFR 39 (L) > OR = 60 mL/min/1.70m2   BUN/Creatinine Ratio 14 6 - 22 (calc)   Sodium 138 135 - 146 mmol/L   Potassium 4.6 3.5 - 5.3 mmol/L   Chloride 104 98 - 110 mmol/L   CO2 27 20 - 32 mmol/L   Calcium  8.3 (L) 8.6 - 10.3 mg/dL   Total Protein 6.7 6.1 - 8.1 g/dL   Albumin 4.0 3.6 - 5.1 g/dL   Globulin 2.7 1.9 - 3.7 g/dL (calc)   AG Ratio 1.5 1.0 - 2.5 (calc)   Total Bilirubin 0.4 0.2 - 1.2 mg/dL   Alkaline phosphatase (APISO) 61 35 - 144 U/L   AST 13 10 - 35 U/L   ALT 10 9 - 46 U/L  Urine Culture     Status: None   Collection Time: 10/28/23 11:31 AM   Specimen: Urine, Clean Catch  Result Value Ref Range   Specimen Description      URINE, CLEAN CATCH Performed at [facility], [address], [place], [place] [address]    Special Requests      NONE Performed at [facility], [address] [place], [place] [address]    Culture      NO GROWTH Performed at [facility], [address], [place], [place] [address]    Report Status 10/29/2023 FINAL   Urinalysis, Complete w Microscopic     Status: Abnormal   Collection Time: 10/28/23 11:32 AM  Result Value Ref Range   Color, Urine COLORLESS (A) YELLOW   APPearance CLEAR CLEAR   Specific Gravity, Urine 1.010 1.005 - 1.030   pH 5.0 5.0 - 8.0   Glucose, UA NEGATIVE NEGATIVE mg/dL   Hgb urine dipstick LARGE (A)  NEGATIVE   Bilirubin Urine NEGATIVE NEGATIVE   Ketones, ur NEGATIVE NEGATIVE mg/dL   Protein, ur NEGATIVE NEGATIVE mg/dL   Nitrite NEGATIVE NEGATIVE   Leukocytes,Ua NEGATIVE NEGATIVE   RBC / HPF >50 0 - 5 RBC/hpf   WBC, UA 6-10 0 - 5 WBC/hpf   Bacteria, UA NONE SEEN NONE SEEN   Squamous Epithelial / HPF 0-5 0 - 5 /HPF   Mucus PRESENT    Hyaline Casts, UA PRESENT     Comment: Performed at [facility], [address] [place], [place] [address]  Multiple Myeloma Panel (SPEP&IFE w/QIG)     Status: None   Collection Time: 10/28/23 11:32 AM  Result Value Ref Range   IgG (Immunoglobin G), Serum 1,011 603 - 1,613 mg/dL   IgA 719 61 - 562 mg/dL   IgM (Immunoglobulin M), Srm 60 15 - 143 mg/dL   Total Protein ELP 6.5 6.0 - 8.5 g/dL   Albumin SerPl Elph-Mcnc  3.4 2.9 - 4.4 g/dL   Alpha 1 0.2 0.0 - 0.4 g/dL   Alpha2 Glob SerPl Elph-Mcnc 0.9 0.4 - 1.0 g/dL   B-Globulin SerPl Elph-Mcnc 1.0 0.7 - 1.3 g/dL   Gamma Glob SerPl Elph-Mcnc 0.9 0.4 - 1.8 g/dL   M Protein SerPl Elph-Mcnc Not Observed Not Observed g/dL   Globulin, Total 3.1 2.2 - 3.9 g/dL   Albumin/Glob SerPl 1.1 0.7 - 1.7   IFE 1 Comment     Comment: (NOTE) The immunofixation pattern appears unremarkable. Evidence of monoclonal protein is not apparent.    Please Note Comment     Comment: (NOTE) Protein electrophoresis scan will follow via computer, mail, or courier delivery. Performed At: [facility] [address] [place], [place] [id number] Jennette Shorter MD Ey:[id number]   Kappa/lambda light chains     Status: Abnormal   Collection Time: 10/28/23 11:32 AM  Result Value Ref Range   Kappa free light chain 66.9 (H) 3.3 - 19.4 mg/L   Lambda free light chains 44.6 (H) 5.7 - 26.3 mg/L   Kappa, lambda light chain ratio 1.50 0.26 - 1.65    Comment: (NOTE) Performed At: [facility] [address] [place], [place] [id number] Jennette Shorter MD Ey:[id number]   Haptoglobin     Status: None    Collection Time: 10/28/23 11:32 AM  Result Value Ref Range   Haptoglobin 264 34 - 355 mg/dL    Comment: (NOTE) Performed At: [facility] [address] [place], [place] [id number] Jennette Shorter MD Ey:[id number]   Lactate dehydrogenase     Status: None   Collection Time: 10/28/23 11:32 AM  Result Value Ref Range   LDH 190 98 - 192 U/L    Comment: Performed at [facility], [address], [place], [place] [address]  Folate     Status: None   Collection Time: 10/28/23 11:32 AM  Result Value Ref Range   Folate 10.2 >5.9 ng/mL    Comment: Performed at [facility], [address] [place], [place] [address]  Vitamin B12     Status: Abnormal   Collection Time: 10/28/23 11:32 AM  Result Value Ref Range   Vitamin B-12 1,274 (H) 180 - 914 pg/mL    Comment: (NOTE) This assay is not validated for testing neonatal or myeloproliferative syndrome specimens for Vitamin B12 levels. Performed at [facility], [address] [place], [place] [address]   Ferritin     Status: None   Collection Time: 10/28/23 11:32 AM  Result Value Ref Range   Ferritin 26 24 - 336 ng/mL    Comment: Performed at [facility], [address] [place], [place] [address]  Iron and TIBC     Status: None   Collection Time: 10/28/23 11:32 AM  Result Value Ref Range   Iron 121 45 - 182 ug/dL   TIBC 580 749 - 549 ug/dL   Saturation Ratios 29 17.9 - 39.5 %   UIBC 298 ug/dL    Comment: Performed at [facility], [address], [place], [place] [address]  CMP (Cancer Center only)     Status: Abnormal   Collection Time: 10/28/23 11:32 AM  Result Value Ref Range   Sodium 138 135 - 145 mmol/L   Potassium 3.3 (L) 3.5 - 5.1 mmol/L   Chloride 95 (L) 98 - 111 mmol/L   CO2 29 22 - 32 mmol/L   Glucose, Bld 182 (H) 70 - 99 mg/dL  Comment: Glucose reference range applies only to samples taken after fasting for at least 8 hours.   BUN 36 (H) 8 - 23 mg/dL    Creatinine 7.58 (H) 0.61 - 1.24 mg/dL   Calcium  9.3 8.9 - 10.3 mg/dL   Total Protein 7.4 6.5 - 8.1 g/dL   Albumin 4.2 3.5 - 5.0 g/dL   AST 21 15 - 41 U/L   ALT 14 0 - 44 U/L   Alkaline Phosphatase 72 38 - 126 U/L   Total Bilirubin 0.5 0.0 - 1.2 mg/dL   GFR, Estimated 27 (L) >60 mL/min    Comment: (NOTE) Calculated using the CKD-EPI Creatinine Equation (2021)    Anion gap 14 5 - 15    Comment: Performed at [facility], [address] [place], [place] [address]  CBC with Differential (Cancer Center Only)     Status: Abnormal   Collection Time: 10/28/23 11:32 AM  Result Value Ref Range   WBC Count 6.9 4.0 - 10.5 K/uL   RBC 3.73 (L) 4.22 - 5.81 MIL/uL   Hemoglobin 11.2 (L) 13.0 - 17.0 g/dL   HCT 64.6 (L) 60.9 - 47.9 %   MCV 94.6 80.0 - 100.0 fL   MCH 30.0 26.0 - 34.0 pg   MCHC 31.7 30.0 - 36.0 g/dL   RDW 85.0 88.4 - 84.4 %   Platelet Count 223 150 - 400 K/uL   nRBC 0.0 0.0 - 0.2 %   Neutrophils Relative % 72 %   Neutro Abs 4.8 1.7 - 7.7 K/uL   Lymphocytes Relative 13 %   Lymphs Abs 0.9 0.7 - 4.0 K/uL   Monocytes Relative 11 %   Monocytes Absolute 0.8 0.1 - 1.0 K/uL   Eosinophils Relative 4 %   Eosinophils Absolute 0.3 0.0 - 0.5 K/uL   Basophils Relative 0 %   Basophils Absolute 0.0 0.0 - 0.1 K/uL   Immature Granulocytes 0 %   Abs Immature Granulocytes 0.02 0.00 - 0.07 K/uL    Comment: Performed at [facility], [address], [place], [place] [address]  Resp panel by RT-PCR (RSV, Flu A&B, Covid) Urine, Clean Catch     Status: None   Collection Time: 11/01/23  3:05 PM   Specimen: Urine, Clean Catch; Nasal Swab  Result Value Ref Range   SARS Coronavirus 2 by RT PCR NEGATIVE NEGATIVE    Comment: (NOTE) SARS-CoV-2 target nucleic acids are NOT DETECTED.  The SARS-CoV-2 RNA is generally detectable in upper respiratory specimens during the acute phase of infection. The lowest concentration of SARS-CoV-2 viral copies this assay can  detect is 138 copies/mL. A negative result does not preclude SARS-Cov-2 infection and should not be used as the sole basis for treatment or other patient management decisions. A negative result may occur with  improper specimen collection/handling, submission of specimen other than nasopharyngeal swab, presence of viral mutation(s) within the areas targeted by this assay, and inadequate number of viral copies(<138 copies/mL). A negative result must be combined with clinical observations, patient history, and epidemiological information. The expected result is Negative.  Fact Sheet for Patients:  BloggerCourse.com  Fact Sheet for Healthcare Providers:  SeriousBroker.it  This test is no t yet approved or cleared by the [place]  FDA and  has been authorized for detection and/or diagnosis of SARS-CoV-2 by FDA under an Emergency Use Authorization (EUA). This EUA will remain  in effect (meaning this test can be used) for the duration of the COVID-19 declaration under Section 564(b)(1)  of the Act, 21 U.S.C.section 360bbb-3(b)(1), unless the authorization is terminated  or revoked sooner.       Influenza A by PCR NEGATIVE NEGATIVE   Influenza B by PCR NEGATIVE NEGATIVE    Comment: (NOTE) The Xpert Xpress SARS-CoV-2/FLU/RSV plus assay is intended as an aid in the diagnosis of influenza from Nasopharyngeal swab specimens and should not be used as a sole basis for treatment. Nasal washings and aspirates are unacceptable for Xpert Xpress SARS-CoV-2/FLU/RSV testing.  Fact Sheet for Patients: BloggerCourse.com  Fact Sheet for Healthcare Providers: SeriousBroker.it  This test is not yet approved or cleared by the [place]  FDA and has been authorized for detection and/or diagnosis of SARS-CoV-2 by FDA under an Emergency Use Authorization (EUA). This EUA will remain in effect  (meaning this test can be used) for the duration of the COVID-19 declaration under Section 564(b)(1) of the Act, 21 U.S.C. section 360bbb-3(b)(1), unless the authorization is terminated or revoked.     Resp Syncytial Virus by PCR NEGATIVE NEGATIVE    Comment: (NOTE) Fact Sheet for Patients: BloggerCourse.com  Fact Sheet for Healthcare Providers: SeriousBroker.it  This test is not yet approved or cleared by the [place]  FDA and has been authorized for detection and/or diagnosis of SARS-CoV-2 by FDA under an Emergency Use Authorization (EUA). This EUA will remain in effect (meaning this test can be used) for the duration of the COVID-19 declaration under Section 564(b)(1) of the Act, 21 U.S.C. section 360bbb-3(b)(1), unless the authorization is terminated or revoked.  Performed at [facility], [address] [place], [place] [address]   Urinalysis, Routine w reflex microscopic -Urine, Clean Catch     Status: Abnormal   Collection Time: 11/01/23  3:08 PM  Result Value Ref Range   Color, Urine YELLOW YELLOW   APPearance CLEAR CLEAR   Specific Gravity, Urine 1.013 1.005 - 1.030   pH 5.0 5.0 - 8.0   Glucose, UA 50 (A) NEGATIVE mg/dL   Hgb urine dipstick LARGE (A) NEGATIVE   Bilirubin Urine NEGATIVE NEGATIVE   Ketones, ur NEGATIVE NEGATIVE mg/dL   Protein, ur NEGATIVE NEGATIVE mg/dL   Nitrite NEGATIVE NEGATIVE   Leukocytes,Ua NEGATIVE NEGATIVE   RBC / HPF >50 0 - 5 RBC/hpf   WBC, UA 6-10 0 - 5 WBC/hpf   Bacteria, UA RARE (A) NONE SEEN   Squamous Epithelial / HPF 0-5 0 - 5 /HPF   Mucus PRESENT     Comment: Performed at [facility], [address], [place], [place] [address]  CBC     Status: Abnormal   Collection Time: 11/01/23  4:11 PM  Result Value Ref Range   WBC 6.6 4.0 - 10.5 K/uL   RBC 3.26 (L) 4.22 - 5.81 MIL/uL   Hemoglobin 9.9 (L) 13.0 - 17.0 g/dL   HCT 67.8 (L) 60.9 -  52.0 %   MCV 98.5 80.0 - 100.0 fL   MCH 30.4 26.0 - 34.0 pg   MCHC 30.8 30.0 - 36.0 g/dL   RDW 85.0 88.4 - 84.4 %   Platelets 188 150 - 400 K/uL   nRBC 0.0 0.0 - 0.2 %    Comment: Performed at [facility], [address], [place], [place] [address]  Basic metabolic panel     Status: Abnormal   Collection Time: 11/01/23  4:11 PM  Result Value Ref Range   Sodium 138 135 - 145 mmol/L   Potassium 4.0 3.5 - 5.1 mmol/L   Chloride  102 98 - 111 mmol/L   CO2 25 22 - 32 mmol/L   Glucose, Bld 165 (H) 70 - 99 mg/dL    Comment: Glucose reference range applies only to samples taken after fasting for at least 8 hours.   BUN 26 (H) 8 - 23 mg/dL   Creatinine, Ser 8.14 (H) 0.61 - 1.24 mg/dL   Calcium  8.8 (L) 8.9 - 10.3 mg/dL   GFR, Estimated 38 (L) >60 mL/min    Comment: (NOTE) Calculated using the CKD-EPI Creatinine Equation (2021)    Anion gap 12 5 - 15    Comment: Performed at [facility], [address] [place], [place] [address]  Pro Brain natriuretic peptide     Status: Abnormal   Collection Time: 11/01/23  4:12 PM  Result Value Ref Range   Pro Brain Natriuretic Peptide 361.0 (H) <300.0 pg/mL    Comment: (NOTE) Age Group        Cut-Points    Interpretation  < 50 years     450 pg/mL       NT-proBNP > 450 pg/mL indicates                                ADHF is likely              50 to 75 years  900 pg/mL      NT-proBNP > 900 pg/mL indicates          ADHF is likely  > 75 years      1800 pg/mL     NT-proBNP > 1800 pg/mL indicates          ADHF is likely                           All ages    Results between       Indeterminate. Further clinical             300 and the cut-   information is needed to determine            point for age group   if ADHF is present.                                                             Elecsys proBNP II/ Elecsys proBNP II STAT           Cut-Point                       Interpretation  300 pg/mL                     NT-proBNP <300pg/mL indicates                             ADHF is not likely  Performed at [facility], [address] [place], [place] [address]      RADIOGRAPHIC STUDIES:  I have personally reviewed the radiological images as listed and agree with the findings in the report.  CT ABDOMEN PELVIS W CONTRAST Result Date: 11/01/2023 CLINICAL DATA:  Hematuria EXAM: CT ABDOMEN AND PELVIS  WITH CONTRAST TECHNIQUE: Multidetector CT imaging of the abdomen and pelvis was performed using the standard protocol following bolus administration of intravenous contrast. RADIATION DOSE REDUCTION: This exam was performed according to the departmental dose-optimization program which includes automated exposure control, adjustment of the mA and/or kV according to patient size and/or use of iterative reconstruction technique. CONTRAST:  80mL OMNIPAQUE  IOHEXOL  300 MG/ML  SOLN COMPARISON:  CT 09/24/2022, 02/09/2022 FINDINGS: Lower chest: Lung bases demonstrate chronic left pleural effusion and partially visualized consolidation in left lower lobe suggestive of round atelectasis. Cardiomegaly with incompletely visualized cardiac pacing leads. Hepatobiliary: No calcified gallstone. Possible surface nodularity of liver as may be seen with cirrhosis. Small subcapsular cyst in the left hepatic lobe. Pancreas: Unremarkable. No pancreatic ductal dilatation or surrounding inflammatory changes. Spleen: Normal in size without focal abnormality. Adrenals/Urinary Tract: Adrenal glands are normal. Renal atrophy. No hydronephrosis. Mild hyperdensity within the renal collecting systems, probably represents mild excreted contrast rather than stones. Ill-defined masslike area at the lower pole of the left kidney, better seen on the prior exam, measures about 5.6 cm compared with 4.8 cm previously. Increased perinephric stranding and fluid is and hazy attenuation at the left renal pelvis and hilus, series 11, image  36. No ureteral stone. Bladder is unremarkable. Stomach/Bowel: Stomach is within a ventral hernia. There is no dilated small bowel. Partial colectomy with right abdominal colostomy. Vascular/Lymphatic: Aortic atherosclerosis. No enlarged abdominal or pelvic lymph nodes. Reproductive: Prostate is unremarkable. Other: Negative for pelvic effusion or free air. Redemonstrated large complex wide neck ventral hernia containing stomach, small bowel colon and mesentery without obstructive features. Chronic skin thickening and mild infiltration of lower abdominal pannus. Musculoskeletal: No acute or suspicious osseous abnormality. IMPRESSION: 1. Limited exam secondary to delayed phase imaging due to technical factors. Ill-defined masslike area at the lower pole of the left kidney, better seen on the prior exam,, measures about 5.6 cm compared with 4.8 cm previously and remains concerning for renal neoplasm/malignancy. Increased perinephric stranding and fluid as well as hazy attenuation at the left renal pelvis and hilus, superimposed upper urinary tract infection is possible. There is no hydronephrosis. 2. Redemonstrated large complex wide neck ventral hernia containing stomach, small bowel and colon without obstructive features. 3. Chronic left pleural effusion and partially visualized consolidation in left lower lobe suggestive of round atelectasis. 4. Possible surface nodularity of liver as may be seen with cirrhosis. 5. Aortic atherosclerosis. Aortic Atherosclerosis (ICD10-I70.0). Electronically Signed   By: Luke Bun M.D.   On: 11/01/2023 18:24   DG Chest 2 View Result Date: 11/01/2023 CLINICAL DATA:  Shortness of breath. EXAM: CHEST - 2 VIEW COMPARISON:  February 09, 2022. FINDINGS: Stable cardiomegaly. Left-sided pacemaker is unchanged. Right lung is clear. Mild left basilar atelectasis and pleural effusion is noted. Bony thorax is unremarkable. IMPRESSION: Mild left basilar atelectasis and associated pleural  effusion. Electronically Signed   By: Lynwood Landy Raddle M.D.   On: 11/01/2023 14:07   Orders Placed This Encounter  Procedures   CBC with Differential (Cancer Center Only)    Standing Status:   Future    Expected Date:   01/27/2024    Expiration Date:   04/26/2024   CMP (Cancer Center only)    Standing Status:   Future    Expected Date:   01/27/2024    Expiration Date:   04/26/2024   Iron and TIBC    Standing Status:   Future    Expected Date:   01/27/2024  Expiration Date:   04/26/2024   Ferritin    Standing Status:   Future    Expected Date:   01/27/2024    Expiration Date:   04/26/2024     Future Appointments  Date Time Provider Department Center  11/14/2023  3:00 PM Roseann Adine PARAS., MD [facility] None  11/15/2023  2:30 PM Little, [name], RN [facility] None  11/18/2023  3:00 PM Ezzard Remak D, LCSW [facility] None  12/09/2023 10:30 AM Roseann Adine PARAS., MD [facility] None  12/13/2023  7:35 AM CVD HVT DEVICE REMOTES [facility] H&V  01/27/2024  3:15 PM [facility] PHLEBOTOMIST [facility] None  01/27/2024  3:45 PM Kylinn Shropshire, Chinita, MD [facility] None  03/09/2024 10:10 AM Shamleffer, Donell Cardinal, MD [facility] None  03/13/2024  7:35 AM CVD HVT DEVICE REMOTES [facility] H&V  04/21/2024 10:20 AM Ngetich, Roxan BROCKS, NP [facility] None  07/07/2024 11:00 AM Ngetich, Roxan BROCKS, NP [facility] None    This document was completed utilizing speech recognition software. Grammatical errors, random word insertions, pronoun errors, and incomplete sentences are an occasional consequence of this system due to software limitations, ambient noise, and hardware issues. Any formal questions or concerns about the content, text or information contained within the body of this dictation should be directly addressed to the provider for clarification.

## 2023-11-11 NOTE — Assessment & Plan Note (Addendum)
 Chronic anemia with hemoglobin levels ranging from 9 to 12.7 since 2023, likely due to decreased erythropoietin production from chronic kidney disease.  On his consultation with us  on 10/28/2023, labs showed stable hemoglobin of 11.2, MCV 94.6.  White count and platelet count were within normal limits.  Creatinine 2.41.  Iron studies showed no evidence of iron deficiency.  B12, folate, ferritin, LDH, haptoglobin are within normal limits.  SPEP, IFE, quantitative immunoglobulins were all unremarkable.  No dense of monoclonal gammopathy.  Both serum free kappa and serum free lambda were elevated, with normal ratio, consistent with CKD picture.  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  If hemoglobin is consistently below 9, then we will consider ESA.

## 2023-11-12 ENCOUNTER — Other Ambulatory Visit: Payer: Self-pay | Admitting: Family

## 2023-11-12 DIAGNOSIS — G8929 Other chronic pain: Secondary | ICD-10-CM

## 2023-11-12 DIAGNOSIS — E1140 Type 2 diabetes mellitus with diabetic neuropathy, unspecified: Secondary | ICD-10-CM | POA: Diagnosis not present

## 2023-11-12 DIAGNOSIS — M79671 Pain in right foot: Secondary | ICD-10-CM | POA: Diagnosis not present

## 2023-11-12 DIAGNOSIS — B351 Tinea unguium: Secondary | ICD-10-CM | POA: Diagnosis not present

## 2023-11-12 DIAGNOSIS — E1142 Type 2 diabetes mellitus with diabetic polyneuropathy: Secondary | ICD-10-CM

## 2023-11-12 DIAGNOSIS — M2041 Other hammer toe(s) (acquired), right foot: Secondary | ICD-10-CM | POA: Diagnosis not present

## 2023-11-12 DIAGNOSIS — L600 Ingrowing nail: Secondary | ICD-10-CM | POA: Diagnosis not present

## 2023-11-12 DIAGNOSIS — M21541 Acquired clubfoot, right foot: Secondary | ICD-10-CM | POA: Diagnosis not present

## 2023-11-12 DIAGNOSIS — M2042 Other hammer toe(s) (acquired), left foot: Secondary | ICD-10-CM | POA: Diagnosis not present

## 2023-11-12 DIAGNOSIS — I83893 Varicose veins of bilateral lower extremities with other complications: Secondary | ICD-10-CM | POA: Diagnosis not present

## 2023-11-12 DIAGNOSIS — M79672 Pain in left foot: Secondary | ICD-10-CM | POA: Diagnosis not present

## 2023-11-13 ENCOUNTER — Encounter: Payer: Self-pay | Admitting: Urology

## 2023-11-13 ENCOUNTER — Other Ambulatory Visit: Payer: Self-pay | Admitting: Family

## 2023-11-13 NOTE — Telephone Encounter (Signed)
 Tramadol refilled.

## 2023-11-13 NOTE — Telephone Encounter (Signed)
 Last given : 07/25/23 Controlled substance

## 2023-11-14 ENCOUNTER — Ambulatory Visit: Admitting: Urology

## 2023-11-14 ENCOUNTER — Other Ambulatory Visit: Payer: Self-pay | Admitting: Family

## 2023-11-14 DIAGNOSIS — R110 Nausea: Secondary | ICD-10-CM

## 2023-11-15 ENCOUNTER — Other Ambulatory Visit: Payer: Self-pay

## 2023-11-15 DIAGNOSIS — G8929 Other chronic pain: Secondary | ICD-10-CM

## 2023-11-15 DIAGNOSIS — E1122 Type 2 diabetes mellitus with diabetic chronic kidney disease: Secondary | ICD-10-CM

## 2023-11-15 DIAGNOSIS — I4891 Unspecified atrial fibrillation: Secondary | ICD-10-CM

## 2023-11-15 NOTE — Patient Instructions (Signed)
 Visit Information  Thank you for taking time to visit with me today. Please don't hesitate to contact me if I can be of assistance to you before our next scheduled appointment.  Your next care management appointment is by telephone on Wednesday, October 22 at 2:00 PM  Please call the care guide team at [phone] if you need to cancel, schedule, or reschedule an appointment.   Please call [phone] (toll free, 24 hour hotline) if you are experiencing a Mental Health or Behavioral Health Crisis or need someone to talk to.  Clayborne Ly RN BSN CCM [place]  [facility], [facility] Health Nurse Care Coordinator  Direct Dial: [phone] Website: [email or web address]

## 2023-11-15 NOTE — Patient Outreach (Signed)
 Complex Care Management   Visit Note  11/15/2023  Name:  Jesse [name] MRN: [id number] DOB: 11/06/1950  Situation: Referral received for Complex Care Management related to Diabetes with Complications, Chronic Kidney Disease, Atrial Fibrillation, and Benign Hypertension, Chronic Congestive Heart Failure, Renal Cell Carcinoma, Colostomy, Overactive Bladder, Iron Deficiency Anemia, Fatigue, Recurrent UTI. I obtained verbal consent from Patient.  Visit completed with Patient on the phone.  Background:   Past Medical History:  Diagnosis Date   Alcohol use disorder in remission 1999   Anemia    iron deficiency   Anxiety and depression    Arthritis    ankles, knees and hands   At high risk for falls    Basal cell carcinoma    x4 (on both shoulders, one on forehead, one on nose)   CKD stage 3b, GFR 30-44 ml/min (HCC)    Former cigarette smoker 1999   Generalized anxiety disorder    Hard of hearing    Headache    History of kidney stones    Hyperlipidemia due to type 2 diabetes mellitus (HCC)    Hypertension    Morbid obesity (HCC)    Neuropathy    O2 dependent    2L   Presence of permanent cardiac pacemaker    Renal cell carcinoma of left kidney (HCC)    pt had cryotherapy for this at [facility] 12/11/22)   Risk for falls    Sleep apnea    uses CPAP   TIA (transient ischemic attack)    pt states this was not confirmed due to MRI unable to be performed   Type 2 diabetes mellitus (HCC)    Ventral hernia with bowel obstruction    Per Hospital Encounter on 05/21/22    Assessment: Patient Reported Symptoms:  Cognitive Cognitive Status: Alert and oriented to person, place, and time, Normal speech and language skills      Neurological Neurological Review of Symptoms: Not assessed    HEENT HEENT Symptoms Reported: Not assessed      Cardiovascular Cardiovascular Symptoms Reported: Not assessed    Respiratory Respiratory Symptoms Reported: Shortness of breath Respiratory  Management Strategies: Oxygen  therapy, CPAP Respiratory Self-Management Outcome: 4 (good)  Endocrine Endocrine Symptoms Reported: No symptoms reported Is patient diabetic?: Yes Is patient checking blood sugars at home?: Yes Endocrine Self-Management Outcome: 4 (good)  Gastrointestinal Gastrointestinal Symptoms Reported: Not assessed      Genitourinary Genitourinary Symptoms Reported: Blood in urine, Other Other Genitourinary Symptoms: discomfort with full bladder Genitourinary Management Strategies: Fluid modification, Medication therapy Genitourinary Self-Management Outcome: 3 (uncertain) Genitourinary Comment: Patient is scheduled to f/u with Urologist, Dr. Roseann on 12/09/23  Integumentary Integumentary Symptoms Reported: Not assessed    Musculoskeletal Musculoskelatal Symptoms Reviewed: Not assessed        Psychosocial Psychosocial Symptoms Reported: No symptoms reported   Major Change/Loss/Stressor/Fears (CP): Medical condition, self Behaviors When Feeling Stressed/Fearful: patient has upcoming scheduled telephone visit with Rolin Kerns LCSW on 11/18/23 Techniques to Cope with Loss/Stress/Change: Diversional activities Quality of Family Relationships: involved, helpful, supportive Do you feel physically threatened by others?: No    11/15/2023    PHQ2-9 Depression Screening   Arilla Hice interest or pleasure in doing things    Feeling down, depressed, or hopeless    PHQ-2 - Total Score    Trouble falling or staying asleep, or sleeping too much    Feeling tired or having Keysi Oelkers energy    Poor appetite or overeating     Feeling bad about yourself -  or that you are a failure or have let yourself or your family down    Trouble concentrating on things, such as reading the newspaper or watching television    Moving or speaking so slowly that other people could have noticed.  Or the opposite - being so fidgety or restless that you have been moving around a lot more than usual     Thoughts that you would be better off dead, or hurting yourself in some way    PHQ2-9 Total Score    If you checked off any problems, how difficult have these problems made it for you to do your work, take care of things at home, or get along with other people    Depression Interventions/Treatment      There were no vitals filed for this visit.  Medications Reviewed Today     Reviewed by Morgan [name], RN (Registered Nurse) on 11/15/23 at 1437  Med List Status: <None>   Medication Order Taking? Sig Documenting Provider Last Dose Status Informant  acetaminophen  (TYLENOL ) 325 MG tablet [id number]  Take 2 tablets (650 mg total) by mouth every 6 (six) hours. [name], MD  Active   alfuzosin  (UROXATRAL ) 10 MG 24 hr tablet [id number]  TAKE 1 TABLET BY MOUTH EVERY DAY Ngetich, [name] C, NP  Active   ALPRAZolam  (XANAX ) 0.5 MG tablet [id number]  TAKE 1 TABLET(0.5 MG) BY MOUTH TWICE DAILY AS NEEDED FOR ANXIETY OR SLEEP Caro, Jessica K, NP  Active   Cholecalciferol (VITAMIN D3 SUPER STRENGTH) 50 MCG (2000 UT) TABS [id number]  Take 2,000 Units by mouth daily. [provider]  Active   Continuous Glucose Sensor (DEXCOM G7 SENSOR) MISC [id number]  APPLY 1 SENSOR TO SKIN EVERY 10 DAYS AS DIRECTED Shamleffer, Ibtehal Jaralla, MD  Active   Cyanocobalamin  (B-12 PO) [id number]  Take 1 tablet by mouth daily. [provider]  Active Self  diclofenac  Sodium (VOLTAREN ) 1 % GEL [id number]  APPLY 2 GRAMS TO RIGHT NECK/SHOULDER AREA 3-4 TIMES DAILY AS NEEDED Ngetich, [name] C, NP  Active   DULoxetine  (CYMBALTA ) 60 MG capsule [id number]  TAKE 1 CAPSULE(60 MG) BY MOUTH DAILY Ngetich, [name] C, NP  Active   ELIQUIS  5 MG TABS tablet [id number]  TAKE 1 TABLET(5 MG) BY MOUTH TWICE DAILY Klein, Steven C, MD  Active   Ferrous Sulfate  (IRON PO) [id number]  Take 2 tablets by mouth daily. [provider]  Active Self  guaiFENesin  (MUCINEX ) 600 MG 12 hr tablet [id number]  Take 1 tablet (600 mg total) by  mouth 2 (two) times daily. Ngetich, [name] C, NP  Active   HYDROcodone -acetaminophen  (NORCO/VICODIN) 5-325 MG tablet [id number]  Take 1 tablet by mouth at bedtime as needed for moderate pain (pain score 4-6). Ngetich, [name] C, NP  Active   insulin  aspart (NOVOLOG ) 100 UNIT/ML injection [id number]  Max daily 100 units Shamleffer, Ibtehal Jaralla, MD  Active Self           Med Note JERALYN DUNCANS A   Wed Jan 30, 2023  1:33 PM) Uses in insulin  pump  isosorbide  mononitrate (IMDUR ) 60 MG 24 hr tablet [id number]  TAKE 1 TABLET(60 MG) BY MOUTH DAILY Ngetich, [name] C, NP  Active   ketoconazole  (NIZORAL ) 2 % cream [id number]  APPLY TOPICALLY TO THE AFFECTED AREA AS NEEDED FOR IRRITATION Ngetich, [name] C, NP  Active Self  methocarbamol  (ROBAXIN ) 500 MG tablet [id number]  TAKE 1 TABLET(500 MG) BY MOUTH EVERY 8 HOURS AS NEEDED FOR MUSCLE SPASMS Ngetich, [name]  C, NP  Active   metolazone  (ZAROXOLYN ) 2.5 MG tablet [id number]  Take 1 tablet (2.5 mg total) by mouth daily. Take 1-2 times a week as needed , spacing out 3 days between doses Ngetich, [name] C, NP  Active Self  Multiple Vitamins-Minerals (CENTRUM ADULT PO) [id number]  Take 1 tablet by mouth daily at 6 (six) AM. [provider]  Active   ondansetron  (ZOFRAN ) 4 MG tablet [id number]  TAKE 1 TABLET(4 MG) BY MOUTH EVERY 8 HOURS AS NEEDED FOR NAUSEA OR VOMITING Ngetich, [name] C, NP  Active   OXYGEN  [id number]  Inhale 2 L into the lungs continuous. [provider]  Active Self  pantoprazole  (PROTONIX ) 40 MG tablet [id number]  Take 1 tablet (40 mg total) by mouth 2 (two) times daily. Medina-Vargas, Monina C, NP  Active   potassium chloride  SA (KLOR-CON  M) 20 MEQ tablet [id number]  Take 1 tablet (20 mEq total) by mouth 2 (two) times daily. Singh, Prashant K, MD  Active Self  Red Yeast Rice 600 MG CAPS [phone]  Take 1,200 mg by mouth daily. [provider]  Active Self  rOPINIRole  (REQUIP ) 2 MG tablet [id number]  TAKE 1 TABLET(2 MG) BY MOUTH AT BEDTIME  Ngetich, [name] C, NP  Active Self  rosuvastatin  (CRESTOR ) 10 MG tablet [id number]  Take 2 tablets (20 mg total) by mouth daily. [name], MD  Active   saccharomyces boulardii (FLORASTOR) 250 MG capsule [id number]  Take 1 capsule (250 mg total) by mouth 2 (two) times daily. Ngetich, [name] C, NP  Active   tirzepatide  (MOUNJARO ) 10 MG/0.5ML Pen [id number]  Inject 10 mg into the skin once a week. Shamleffer, Ibtehal Jaralla, MD  Active   tiZANidine  (ZANAFLEX ) 2 MG tablet [id number]  TAKE 1 TABLET BY MOUTH TWICE DAILY Ngetich, [name] C, NP  Active   topiramate  (TOPAMAX ) 25 MG tablet [id number]  TAKE 1 TABLET BY MOUTH EVERY DAY Ngetich, [name] C, NP  Active   torsemide (DEMADEX) 100 MG tablet [id number]  Take 100 mg by mouth daily. Patient takes 1/2 tablet in the morning and 1/2 tablet in the afternoon [provider]  Active   traMADol  (ULTRAM ) 50 MG tablet [id number]  TAKE 1 TABLET(50 MG) BY MOUTH TWICE DAILY AS NEEDED. Ngetich, [name] C, NP  Active   traZODone  (DESYREL ) 50 MG tablet [id number]  TAKE 2 TABLETS(100 MG) BY MOUTH AT BEDTIME Ngetich, [name] C, NP  Active   triamcinolone  cream (KENALOG ) 0.1 % [id number]  Apply 1 Application topically as needed. [provider]  Active   Vibegron  (GEMTESA ) 75 MG TABS [id number]  Take 1 tablet (75 mg total) by mouth daily. Stoneking, Adine PARAS., MD  Active   Vitamin D , Ergocalciferol , (DRISDOL ) 1.25 MG (50000 UNIT) CAPS capsule [id number]  TAKE 1 CAPSULE BY MOUTH EVERY 7 DAYS Ngetich, [name] C, NP  Active   Med List Note (Horton, Jeannetta, CPhT 09/07/21 0908): Cpap 12.5 pressure            Recommendation:   Specialty provider follow-up with Dr. Roseann, Urologist at [facility] on 12/09/23 at 10:30 AM  Follow Up Plan:   Telephone follow up appointment date/time:  Wednesday, October 22 at 2:00 PM  Clayborne Ly RN BSN CCM [place]  [facility], [facility] Nurse Care Coordinator  Direct Dial:  [phone] Website: [email or web address]

## 2023-11-18 ENCOUNTER — Other Ambulatory Visit: Payer: Self-pay | Admitting: Licensed Clinical Social Worker

## 2023-11-18 MED ORDER — HYDROCODONE-ACETAMINOPHEN 5-325 MG PO TABS: 1.0000 | ORAL_TABLET | Freq: Every evening | ORAL | 0 refills | Status: DC | PRN

## 2023-11-18 NOTE — Patient Instructions (Signed)
 Visit Information  Thank you for taking time to visit with me today. Please don't hesitate to contact me if I can be of assistance to you.  Please call the Suicide and Crisis Lifeline: 988 go to [facility] [address], [place] [phone]) call 911 if you are experiencing a Mental Health or Behavioral Health Crisis or need someone to talk to.  Patient verbalizes understanding of instructions and care plan provided today and agrees to view in MyChart. Active MyChart status and patient understanding of how to access instructions and care plan via MyChart confirmed with patient.     Rolin Ezzard HUGHS [facility]  [facility], [facility] Clinical Social Worker Direct Dial: [phone]  Fax: [phone] Website: [email or web address] 3:47 PM

## 2023-11-18 NOTE — Addendum Note (Signed)
 Addended by: SUELLEN DEVIN BROCKS on: 11/18/2023 08:08 AM   Modules accepted: Orders

## 2023-11-18 NOTE — Patient Outreach (Signed)
 Complex Care Management   Visit Note  11/18/2023  Name:  Jesse [name] MRN: [id number] DOB: 11/06/1950  Situation: Referral received for Complex Care Management related to Mental/Behavioral Health diagnosis Anxiety I obtained verbal consent from Patient.  Visit completed with Patient  on the phone  Background:   Past Medical History:  Diagnosis Date   Alcohol use disorder in remission 1999   Anemia    iron deficiency   Anxiety and depression    Arthritis    ankles, knees and hands   At high risk for falls    Basal cell carcinoma    x4 (on both shoulders, one on forehead, one on nose)   CKD stage 3b, GFR 30-44 ml/min (HCC)    Former cigarette smoker 1999   Generalized anxiety disorder    Hard of hearing    Headache    History of kidney stones    Hyperlipidemia due to type 2 diabetes mellitus (HCC)    Hypertension    Morbid obesity (HCC)    Neuropathy    O2 dependent    2L   Presence of permanent cardiac pacemaker    Renal cell carcinoma of left kidney (HCC)    pt had cryotherapy for this at [facility] 12/11/22)   Risk for falls    Sleep apnea    uses CPAP   TIA (transient ischemic attack)    pt states this was not confirmed due to MRI unable to be performed   Type 2 diabetes mellitus (HCC)    Ventral hernia with bowel obstruction    Per Hospital Encounter on 05/21/22    Assessment: Patient Reported Symptoms:  Cognitive Cognitive Status: No symptoms reported, Alert and oriented to person, place, and time, Normal speech and language skills Cognitive/Intellectual Conditions Management [RPT]: None reported or documented in medical history or problem list   Health Maintenance Behaviors: Annual physical exam  Neurological Neurological Review of Symptoms: No symptoms reported Neurological Management Strategies: Medication therapy, Routine screening  HEENT HEENT Symptoms Reported: No symptoms reported      Cardiovascular Cardiovascular Symptoms Reported: Swelling in legs or  feet Does patient have uncontrolled Hypertension?: No Is patient checking Blood Pressure at home?: No Cardiovascular Management Strategies: Routine screening, Medication therapy  Respiratory Respiratory Symptoms Reported: Shortness of breath Respiratory Management Strategies: Oxygen  therapy, CPAP  Endocrine Endocrine Symptoms Reported: No symptoms reported Is patient diabetic?: Yes    Gastrointestinal Gastrointestinal Symptoms Reported: No symptoms reported Gastrointestinal Management Strategies: Colostomy    Genitourinary Genitourinary Symptoms Reported: Not assessed    Integumentary Integumentary Symptoms Reported: Not assessed    Musculoskeletal Musculoskelatal Symptoms Reviewed: Limited mobility Musculoskeletal Management Strategies: Routine screening, Medical device      Psychosocial Psychosocial Symptoms Reported: Anxiety - if selected complete GAD Additional Psychological Details: Pt has been diagnosed with GAD; however, has great insight on triggers. Establishes healthy boundaries and has healthy coping skills. Pt participates in med management through PCP and has access to virtual therapy Behavioral Management Strategies: Support system, Coping strategies, Abstinence from substances, Counseling, Medication therapy Behavioral Health Self-Management Outcome: 4 (good) Major Change/Loss/Stressor/Fears (CP): Medical condition, self Techniques to Cope with Loss/Stress/Change: Counseling, Medication Quality of Family Relationships: helpful, involved, supportive    11/18/2023    PHQ2-9 Depression Screening   Little interest or pleasure in doing things    Feeling down, depressed, or hopeless    PHQ-2 - Total Score    Trouble falling or staying asleep, or sleeping too much    Feeling tired  or having little energy    Poor appetite or overeating     Feeling bad about yourself - or that you are a failure or have let yourself or your family down    Trouble concentrating on things,  such as reading the newspaper or watching television    Moving or speaking so slowly that other people could have noticed.  Or the opposite - being so fidgety or restless that you have been moving around a lot more than usual    Thoughts that you would be better off dead, or hurting yourself in some way    PHQ2-9 Total Score    If you checked off any problems, how difficult have these problems made it for you to do your work, take care of things at home, or get along with other people    Depression Interventions/Treatment      There were no vitals filed for this visit.  Medications Reviewed Today     Reviewed by Ezzard Rolin BIRCH, LCSW (Social Worker) on 11/18/23 at 1507  Med List Status: <None>   Medication Order Taking? Sig Documenting Provider Last Dose Status Informant  acetaminophen  (TYLENOL ) 325 MG tablet [id number]  Take 2 tablets (650 mg total) by mouth every 6 (six) hours. [name], MD  Active   alfuzosin  (UROXATRAL ) 10 MG 24 hr tablet [id number]  TAKE 1 TABLET BY MOUTH EVERY DAY Ngetich, [name] C, NP  Active   ALPRAZolam  (XANAX ) 0.5 MG tablet [id number]  TAKE 1 TABLET(0.5 MG) BY MOUTH TWICE DAILY AS NEEDED FOR ANXIETY OR SLEEP Caro, Jessica K, NP  Active   Cholecalciferol (VITAMIN D3 SUPER STRENGTH) 50 MCG (2000 UT) TABS [id number]  Take 2,000 Units by mouth daily. [provider]  Active   Continuous Glucose Sensor (DEXCOM G7 SENSOR) MISC [id number]  APPLY 1 SENSOR TO SKIN EVERY 10 DAYS AS DIRECTED Shamleffer, Ibtehal Jaralla, MD  Active   Cyanocobalamin  (B-12 PO) [id number]  Take 1 tablet by mouth daily. [provider]  Active Self  diclofenac  Sodium (VOLTAREN ) 1 % GEL [id number]  APPLY 2 GRAMS TO RIGHT NECK/SHOULDER AREA 3-4 TIMES DAILY AS NEEDED Ngetich, [name] C, NP  Active   DULoxetine  (CYMBALTA ) 60 MG capsule [id number]  TAKE 1 CAPSULE(60 MG) BY MOUTH DAILY Ngetich, [name] C, NP  Active   ELIQUIS  5 MG TABS tablet [id number]  TAKE 1 TABLET(5 MG) BY MOUTH TWICE  DAILY [name], MD  Active   Ferrous Sulfate  (IRON PO) [id number]  Take 2 tablets by mouth daily. [provider]  Active Self  guaiFENesin  (MUCINEX ) 600 MG 12 hr tablet [id number]  Take 1 tablet (600 mg total) by mouth 2 (two) times daily. Ngetich, [name] C, NP  Active   HYDROcodone -acetaminophen  (NORCO/VICODIN) 5-325 MG tablet [id number]  Take 1 tablet by mouth at bedtime as needed for moderate pain (pain score 4-6). Ngetich, [name] C, NP  Active   insulin  aspart (NOVOLOG ) 100 UNIT/ML injection [id number]  Max daily 100 units Shamleffer, Ibtehal Jaralla, MD  Active Self           Med Note JERALYN DUNCANS A   Wed Jan 30, 2023  1:33 PM) Uses in insulin  pump  isosorbide  mononitrate (IMDUR ) 60 MG 24 hr tablet [id number]  TAKE 1 TABLET(60 MG) BY MOUTH DAILY Ngetich, [name] C, NP  Active   ketoconazole  (NIZORAL ) 2 % cream [id number]  APPLY TOPICALLY TO THE AFFECTED AREA AS NEEDED FOR IRRITATION Ngetich, [name] C, NP  Active Self  methocarbamol  (ROBAXIN ) 500  MG tablet [id number]  TAKE 1 TABLET(500 MG) BY MOUTH EVERY 8 HOURS AS NEEDED FOR MUSCLE SPASMS Ngetich, [name] C, NP  Active   metolazone  (ZAROXOLYN ) 2.5 MG tablet [id number]  Take 1 tablet (2.5 mg total) by mouth daily. Take 1-2 times a week as needed , spacing out 3 days between doses Ngetich, [name] C, NP  Active Self  Multiple Vitamins-Minerals (CENTRUM ADULT PO) [id number]  Take 1 tablet by mouth daily at 6 (six) AM. [provider]  Active   ondansetron  (ZOFRAN ) 4 MG tablet [id number]  TAKE 1 TABLET(4 MG) BY MOUTH EVERY 8 HOURS AS NEEDED FOR NAUSEA OR VOMITING Ngetich, [name] C, NP  Active   OXYGEN  [id number]  Inhale 2 L into the lungs continuous. [provider]  Active Self  pantoprazole  (PROTONIX ) 40 MG tablet [id number]  Take 1 tablet (40 mg total) by mouth 2 (two) times daily. Medina-Vargas, Monina C, NP  Active   potassium chloride  SA (KLOR-CON  M) 20 MEQ tablet [id number]  Take 1 tablet (20 mEq total) by mouth 2 (two) times  daily. Singh, Prashant K, MD  Active Self  Red Yeast Rice 600 MG CAPS [phone]  Take 1,200 mg by mouth daily. [provider]  Active Self  rOPINIRole  (REQUIP ) 2 MG tablet [id number]  TAKE 1 TABLET(2 MG) BY MOUTH AT BEDTIME Ngetich, [name] C, NP  Active Self  rosuvastatin  (CRESTOR ) 10 MG tablet [id number]  Take 2 tablets (20 mg total) by mouth daily. [name], MD  Active   saccharomyces boulardii (FLORASTOR) 250 MG capsule [id number]  Take 1 capsule (250 mg total) by mouth 2 (two) times daily. Ngetich, [name] C, NP  Active   tirzepatide  (MOUNJARO ) 10 MG/0.5ML Pen [id number]  Inject 10 mg into the skin once a week. Shamleffer, Ibtehal Jaralla, MD  Active   tiZANidine  (ZANAFLEX ) 2 MG tablet [id number]  TAKE 1 TABLET BY MOUTH TWICE DAILY Ngetich, [name] C, NP  Active   topiramate  (TOPAMAX ) 25 MG tablet [id number]  TAKE 1 TABLET BY MOUTH EVERY DAY Ngetich, [name] C, NP  Active   torsemide (DEMADEX) 100 MG tablet [id number]  Take 100 mg by mouth daily. Patient takes 1/2 tablet in the morning and 1/2 tablet in the afternoon [provider]  Active   traMADol  (ULTRAM ) 50 MG tablet [id number]  TAKE 1 TABLET(50 MG) BY MOUTH TWICE DAILY AS NEEDED. Ngetich, [name] C, NP  Active   traZODone  (DESYREL ) 50 MG tablet [id number]  TAKE 2 TABLETS(100 MG) BY MOUTH AT BEDTIME Ngetich, [name] C, NP  Active   triamcinolone  cream (KENALOG ) 0.1 % [id number]  Apply 1 Application topically as needed. [provider]  Active   Vibegron  (GEMTESA ) 75 MG TABS [id number]  Take 1 tablet (75 mg total) by mouth daily. Stoneking, Adine PARAS., MD  Active   Vitamin D , Ergocalciferol , (DRISDOL ) 1.25 MG (50000 UNIT) CAPS capsule [id number]  TAKE 1 CAPSULE BY MOUTH EVERY 7 DAYS Ngetich, [name] C, NP  Active   Med List Note Christie Alexander, CPhT 09/07/21 0908): Cpap 12.5 pressure            Recommendation:   Continue Current Plan of Care  Follow Up Plan:   Patient declined LCSW follow up noting he is participating in  medication management and has access to tele-therapy counselor. Patient reports situational anxiety symptoms were triggered by current political climate and pending class action lawsuit; however, he is utilizing healthy coping skills and has a strong support system  Rolin Kerns, LCSW [place]  [facility], [facility] Clinical Social Worker Direct Dial: [phone]  Fax: [phone] Website: [email or web address] 3:46 PM

## 2023-11-18 NOTE — Addendum Note (Signed)
 Addended byBETHA LEONARDA BURDOCK C on: 11/18/2023 12:15 PM   Modules accepted: Orders

## 2023-11-18 NOTE — Telephone Encounter (Signed)
 Patient is requesting a refill of the following medications: Requested Prescriptions    No prescriptions requested or ordered in this encounter    Date of last refill: 10/01/23  Refill amount: 30  Treatment agreement date: No treatment agreement on file, notation made on pending appointment, 04/21/24

## 2023-11-21 ENCOUNTER — Telehealth: Payer: Self-pay

## 2023-11-21 NOTE — Progress Notes (Signed)
 Remote PPM Transmission

## 2023-11-21 NOTE — Progress Notes (Signed)
   Telephone encounter was:  Successful.  Complex Care Management Note Care Guide Note  11/21/2023 Name: Jesse [name] MRN: [id number] DOB: 06-11-1950  Jesse [name] is a 73 y.o. year old male who is a primary care patient of Ngetich, [name] C, NP . The community resource team was consulted for assistance with Transportation Needs   SDOH screenings and interventions completed:  No        Care guide performed the following interventions: Patient provided with information about care guide support team and interviewed to confirm resource needs.Pt requested to call back after 9am   Follow Up Plan:  Care guide will follow up with patient by phone over the next 72 hrs  Encounter Outcome:  Requested a call back    Jon Colt [facility]  [facility] Guide, Phone: [phone] Fax: [phone] Website: [email or web address]

## 2023-11-25 DIAGNOSIS — G4733 Obstructive sleep apnea (adult) (pediatric): Secondary | ICD-10-CM | POA: Diagnosis not present

## 2023-11-26 ENCOUNTER — Telehealth: Payer: Self-pay

## 2023-11-26 NOTE — Progress Notes (Signed)
   Telephone encounter was:  Successful.  Complex Care Management Note Care Guide Note  11/26/2023 Name: Jesse [name] MRN: [id number] DOB: 09-04-1950  Jesse [name] is a 73 y.o. year old male who is a primary care patient of Ngetich, [name] C, NP . The community resource team was consulted for assistance with Transportation Needs   SDOH screenings and interventions completed:  Yes  Social Drivers of Health From This Encounter   Transportation Needs: Unmet Transportation Needs (11/26/2023)   PRAPARE - Administrator, Civil Service (Medical): Yes    Lack of Transportation (Non-Medical): Yes    SDOH Interventions Today    Flowsheet Row Most Recent Value  SDOH Interventions   Transportation Interventions Community Resources Provided     Care guide performed the following interventions: Patient provided with information about care guide support team and interviewed to confirm resource needs. Pt stated he is in need of transportation to his Urology appointment in [place]. Pt needs transportation with a lift. Pt uses [place] but they will not transport him to [place]   Follow Up Plan:  Care guide will follow up with patient by phone over the next 72 hrs  Encounter Outcome:  Patient Visit Completed    Jon Colt [facility]  [facility] Guide, Phone: [phone] Fax: [phone] Website: [email or web address]

## 2023-11-28 DIAGNOSIS — Z933 Colostomy status: Secondary | ICD-10-CM | POA: Diagnosis not present

## 2023-11-28 DIAGNOSIS — K56609 Unspecified intestinal obstruction, unspecified as to partial versus complete obstruction: Secondary | ICD-10-CM | POA: Diagnosis not present

## 2023-11-28 DIAGNOSIS — E119 Type 2 diabetes mellitus without complications: Secondary | ICD-10-CM | POA: Diagnosis not present

## 2023-11-28 DIAGNOSIS — Z794 Long term (current) use of insulin: Secondary | ICD-10-CM | POA: Diagnosis not present

## 2023-11-28 DIAGNOSIS — E118 Type 2 diabetes mellitus with unspecified complications: Secondary | ICD-10-CM | POA: Diagnosis not present

## 2023-12-02 ENCOUNTER — Other Ambulatory Visit: Payer: Self-pay | Admitting: Family

## 2023-12-02 DIAGNOSIS — I5022 Chronic systolic (congestive) heart failure: Secondary | ICD-10-CM

## 2023-12-02 DIAGNOSIS — Z993 Dependence on wheelchair: Secondary | ICD-10-CM

## 2023-12-02 NOTE — Telephone Encounter (Signed)
 Ngetich, Roxan BROCKS, NP to Me  McGill, Alondra (Selected Message)     12/02/23 12:31 PM Please fax DME order to [facility] [phone]  as requested.

## 2023-12-02 NOTE — Telephone Encounter (Signed)
 Message routed to PCP Ngetich, Roxan BROCKS, NP to type DME note and I will fax to requested number for patient.

## 2023-12-02 NOTE — Telephone Encounter (Signed)
 I have faxed to [employer] as requested and put Attention to Amber.

## 2023-12-03 ENCOUNTER — Telehealth: Payer: Self-pay

## 2023-12-08 ENCOUNTER — Other Ambulatory Visit: Payer: Self-pay | Admitting: Family

## 2023-12-08 DIAGNOSIS — G2581 Restless legs syndrome: Secondary | ICD-10-CM

## 2023-12-09 ENCOUNTER — Ambulatory Visit: Admitting: Urology

## 2023-12-09 ENCOUNTER — Encounter: Payer: Self-pay | Admitting: Urology

## 2023-12-09 VITALS — BP 121/52 | HR 74

## 2023-12-09 DIAGNOSIS — N401 Enlarged prostate with lower urinary tract symptoms: Secondary | ICD-10-CM

## 2023-12-09 DIAGNOSIS — N3281 Overactive bladder: Secondary | ICD-10-CM

## 2023-12-09 DIAGNOSIS — R319 Hematuria, unspecified: Secondary | ICD-10-CM | POA: Diagnosis not present

## 2023-12-09 DIAGNOSIS — Z85528 Personal history of other malignant neoplasm of kidney: Secondary | ICD-10-CM

## 2023-12-09 DIAGNOSIS — R3129 Other microscopic hematuria: Secondary | ICD-10-CM

## 2023-12-09 DIAGNOSIS — N138 Other obstructive and reflux uropathy: Secondary | ICD-10-CM | POA: Diagnosis not present

## 2023-12-09 DIAGNOSIS — N219 Calculus of lower urinary tract, unspecified: Secondary | ICD-10-CM | POA: Diagnosis not present

## 2023-12-09 DIAGNOSIS — Z87440 Personal history of urinary (tract) infections: Secondary | ICD-10-CM | POA: Diagnosis not present

## 2023-12-09 DIAGNOSIS — C642 Malignant neoplasm of left kidney, except renal pelvis: Secondary | ICD-10-CM

## 2023-12-09 LAB — MICROSCOPIC EXAMINATION: RBC, Urine: >30 /HPF — AB (ref 0–2)

## 2023-12-09 LAB — URINALYSIS, ROUTINE W REFLEX MICROSCOPIC
Bilirubin, UA: NEGATIVE
Glucose, UA: NEGATIVE
Ketones, UA: NEGATIVE
Leukocytes,UA: NEGATIVE
Nitrite, UA: NEGATIVE
Specific Gravity, UA: 1.015 (ref 1.005–1.030)
Urobilinogen, Ur: 0.2 mg/dL (ref 0.2–1.0)
pH, UA: 5.5 (ref 5.0–7.5)

## 2023-12-09 MED ORDER — SOLIFENACIN SUCCINATE 5 MG PO TABS: 5.0000 mg | ORAL_TABLET | Freq: Every day | ORAL | 11 refills | Status: DC

## 2023-12-09 NOTE — Progress Notes (Signed)
 Assessment: 1. OAB (overactive bladder)   2. BPH with obstruction/lower urinary tract symptoms   3. Renal cell carcinoma of left kidney (HCC); s/p perc ablation 10/24; salvage XRT 4/25   4. History of UTI   5. Microscopic hematuria     Plan: Continue alfuzosin  10 mg daily. D/C Gemtesa  Trial of solifenacin 5 mg daily. Rx sent. Hematuria profile sent. Call with results of medication in 3-4 weeks Return to office in 6 weeks  Chief Complaint:  Chief Complaint  Patient presents with   Over Active Bladder    History of Present Illness:  Jesse [name] is a 73 y.o. male who is seen for continued evaluation of BPH with lower urinary tract symptoms, OAB symptoms, and history of left renal cell carcinoma.  He has a significant urologic history and was previously seen by Dr. Nieves at [facility]. He was diagnosed with a left renal mass, initially found in December 2023 on CT imaging.  CT from September 24, 2022 showed a 4.3 cm left renal mass with possible early venous invasion.  Management complicated by a large ventral abdominal wall hernia and chronic kidney disease with a GFR of 38.  He also has COPD and is on home O2.  He was referred to [facility] for management of the left renal mass.  He underwent percutaneous cryoablation on 12/11/2022.  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  Follow-up imaging with CT scan on 03/29/2023 showed a masslike enhancement along the lower aspect of the ablation bed.  He subsequently underwent treatment of recurrent disease with radiation therapy, which he completed in April 2025. CT from July 2025 showed a left inferior pole renal lesion measuring approximately 3.9 x 4.7 cm in size.  He has a long history of lower urinary tract symptoms with frequency, urgency, and weak stream.  He has been managed with alfuzosin . At his initial visit in June 2025, he reported symptoms of frequency, urgency, occasional urge incontinence.  He was voiding 10  times per day and 1-2 times per night.  He reported a good stream.  He was not having any dysuria or gross hematuria.  He reported possible episodes of chronic prostatitis.  He feels the need to have a bowel movement in association with his urinary symptoms.  He was previously treated with a 42-day course of an antibiotic without significant improvement. IPSS = 18/4.  He has a prior history of UTIs.   Urine culture from 06/17/2023 grew 50-100 K Serratia. Urine culture from 07/02/2023 showed no growth.  He has a history of erectile dysfunction.  He is on Imdur .  He has a history of nephrolithiasis requiring ureteroscopy in the past.  He was given a trial of Gemtesa  75 mg daily for his OAB symptoms.  At his visit in July 2025, he reported improvement in his urinary symptoms with the addition of Gemtesa .  He noted decreased frequency and urgency.  No side effects. IPSS = 17/2.  He developed gross hematuria in September 2025.  No flank pain or dysuria. Urine culture from 10/28/2023 showed no growth. CT abdomen and pelvis with contrast from 11/01/2023 showed no hydronephrosis, mild hyperdensity within the renal collecting systems, ill-defined masslike area at the lower pole of the left kidney measuring 5.6 cm.  He returns today for follow-up.  He continues on alfuzosin  and Gemtesa .  He reports urgency and some straining to void as well as frequency. He feels like his LUTS are slightly worsened.   No dysuria or  gross hematuria. IPSS = 17/4  Portions of the above documentation were copied from a prior visit for review purposes only.   Past Medical History:  Past Medical History:  Diagnosis Date   Alcohol use disorder in remission 1999   Anemia    iron deficiency   Anxiety and depression    Arthritis    ankles, knees and hands   At high risk for falls    Basal cell carcinoma    x4 (on both shoulders, one on forehead, one on nose)   CKD stage 3b, GFR 30-44 ml/min (HCC)    Former cigarette  smoker 1999   Generalized anxiety disorder    Hard of hearing    Headache    History of kidney stones    Hyperlipidemia due to type 2 diabetes mellitus (HCC)    Hypertension    Morbid obesity (HCC)    Neuropathy    O2 dependent    2L   Presence of permanent cardiac pacemaker    Renal cell carcinoma of left kidney (HCC)    pt had cryotherapy for this at [facility] 12/11/22)   Risk for falls    Sleep apnea    uses CPAP   TIA (transient ischemic attack)    pt states this was not confirmed due to MRI unable to be performed   Type 2 diabetes mellitus (HCC)    Ventral hernia with bowel obstruction    Per Hospital Encounter on 05/21/22    Past Surgical History:  Past Surgical History:  Procedure Laterality Date   APPENDECTOMY  1980   BREAST SURGERY Left    removed part of breast due to gynecomastia   COLOSTOMY  1989   and reversal same year   COLOSTOMY  2017   FINGER ARTHRODESIS Right 02/04/2023   Procedure: RIGHT INDEX FINGER PROXIMAL INTERPHALANGEAL JOINT ARTHRODESIS;  Surgeon: [name], MD;  Location: [facility] OR;  Service: Orthopedics;  Laterality: Right;   IR THORACENTESIS ASP PLEURAL SPACE W/IMG GUIDE  05/18/2021   IR THORACENTESIS ASP PLEURAL SPACE W/IMG GUIDE  10/06/2021   KNEE SURGERY Right    NASAL SEPTUM SURGERY  1982   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   Procedure: PACEMAKER IMPLANT;  Surgeon: [name], MD;  Location: [facility] INVASIVE CV LAB;  Service: Cardiovascular;  Laterality: N/A;   RENAL CRYOABLATION  12/11/2022   VARICOCELECTOMY  1983    Allergies:  Allergies  Allergen Reactions   Tape Rash    If left on for a long time    Family History:  Family History  Problem Relation Age of Onset   Diabetes Brother    Bipolar disorder Daughter    Anxiety disorder Son     Social History:  Social History   Tobacco Use   Smoking status: Former    Current packs/day: 0.00    Types: Cigarettes    Quit date: 1999    Years since quitting: 26.8   Smokeless  tobacco: Never  Vaping Use   Vaping status: Never Used  Substance Use Topics   Alcohol use: Not Currently    Comment: no alcohol since 1999   Drug use: Never    ROS: Constitutional:  Negative for fever, chills, weight loss CV: Negative for chest pain, previous MI, hypertension Respiratory:  Negative for shortness of breath, wheezing, sleep apnea, frequent cough GI:  Negative for nausea, vomiting, bloody stool, GERD  Physical exam: BP (!) 121/52   Pulse 74  GENERAL APPEARANCE:  Well appearing, well developed,  well nourished, NAD HEENT:  Atraumatic, normocephalic, oropharynx clear NECK:  Supple without lymphadenopathy or thyromegaly ABDOMEN:  Soft, non-tender, no masses EXTREMITIES:  Moves all extremities well, without clubbing, cyanosis, or edema NEUROLOGIC:  Alert and oriented x 3,  CN II-XII grossly intact MENTAL STATUS:  appropriate BACK:  Non-tender to palpation, No CVAT SKIN:  Warm, dry, and intact   Results: U/A: 0-5 WBCs, >30 RBCs

## 2023-12-09 NOTE — Telephone Encounter (Signed)
 Pharmacy requested refill.  ?Pended Rx and sent to [facility] for approval.  ?

## 2023-12-11 ENCOUNTER — Other Ambulatory Visit: Payer: Self-pay

## 2023-12-11 NOTE — Patient Outreach (Signed)
 Complex Care Management   Visit Note  12/11/2023  Name:  Jesse [name] MRN: [id number] DOB: February 20, 1950  Situation: Referral received for Complex Care Management related to Diabetes with Complications, Chronic Kidney Disease, Atrial Fibrillation, and Benign Hypertension, Chronic Congestive Heart Failure, Renal Cell Carcinoma, Colostomy, Overactive Bladder, Iron Deficiency Anemia, Fatigue, Recurrent UTI, hematuria.  I obtained verbal consent from Patient.  Visit completed with Patient on the phone.  Background:   Past Medical History:  Diagnosis Date   Alcohol use disorder in remission 1999   Anemia    iron deficiency   Anxiety and depression    Arthritis    ankles, knees and hands   At high risk for falls    Basal cell carcinoma    x4 (on both shoulders, one on forehead, one on nose)   CKD stage 3b, GFR 30-44 ml/min (HCC)    Former cigarette smoker 1999   Generalized anxiety disorder    Hard of hearing    Headache    History of kidney stones    Hyperlipidemia due to type 2 diabetes mellitus (HCC)    Hypertension    Morbid obesity (HCC)    Neuropathy    O2 dependent    2L   Presence of permanent cardiac pacemaker    Renal cell carcinoma of left kidney (HCC)    pt had cryotherapy for this at [facility] 12/11/22)   Risk for falls    Sleep apnea    uses CPAP   TIA (transient ischemic attack)    pt states this was not confirmed due to MRI unable to be performed   Type 2 diabetes mellitus (HCC)    Ventral hernia with bowel obstruction    Per Hospital Encounter on 05/21/22    Assessment: Patient Reported Symptoms:  Cognitive Cognitive Status: Alert and oriented to person, place, and time, Normal speech and language skills Cognitive/Intellectual Conditions Management [RPT]: None reported or documented in medical history or problem list   Health Maintenance Behaviors: Annual physical exam, Healthy diet Health Facilitated by: Healthy diet, Rest  Neurological Neurological Review  of Symptoms: No symptoms reported    HEENT HEENT Symptoms Reported: Not assessed      Cardiovascular Cardiovascular Symptoms Reported: Swelling in legs or feet Does patient have uncontrolled Hypertension?: No Cardiovascular Management Strategies: Adequate rest, Medication therapy, Routine screening Cardiovascular Self-Management Outcome: 4 (good) Cardiovascular Comment: swelling as improved since increasing Metolazone   Respiratory Respiratory Symptoms Reported: Shortness of breath Respiratory Management Strategies: Adequate rest, Oxygen  therapy, Routine screening Respiratory Self-Management Outcome: 3 (uncertain)  Endocrine Endocrine Symptoms Reported: Hyperglycemia Is patient diabetic?: Yes Is patient checking blood sugars at home?: Yes List most recent blood sugar readings, include date and time of day: FBS today 147; still having high's around 350 Endocrine Self-Management Outcome: 4 (good)  Gastrointestinal Gastrointestinal Symptoms Reported: Change in appetite Gastrointestinal Management Strategies: Colostomy Gastrointestinal Self-Management Outcome: 4 (good)    Genitourinary Genitourinary Symptoms Reported: Blood in urine, Urgency Additional Genitourinary Details: patient completed a follow up with Urology on 10/21 who is managing symptoms Genitourinary Management Strategies: Medication therapy, Fluid modification Genitourinary Self-Management Outcome: 3 (uncertain) Genitourinary Comment: Patient will start a newly prescribed medication today as prescribed by Urology  Integumentary Integumentary Symptoms Reported: Not assessed    Musculoskeletal Musculoskelatal Symptoms Reviewed: Difficulty walking, Limited mobility Musculoskeletal Management Strategies: Medical device, Routine screening, Adequate rest Musculoskeletal Self-Management Outcome: 4 (good)      Psychosocial Psychosocial Symptoms Reported: Not assessed   Major Change/Loss/Stressor/Fears (CP): Medical condition,  self Techniques to Cope with Loss/Stress/Change: Diversional activities Quality of Family Relationships: involved, helpful, supportive Do you feel physically threatened by others?: No    12/11/2023    PHQ2-9 Depression Screening   Conor Filsaime interest or pleasure in doing things    Feeling down, depressed, or hopeless    PHQ-2 - Total Score    Trouble falling or staying asleep, or sleeping too much    Feeling tired or having Albirtha Grinage energy    Poor appetite or overeating     Feeling bad about yourself - or that you are a failure or have let yourself or your family down    Trouble concentrating on things, such as reading the newspaper or watching television    Moving or speaking so slowly that other people could have noticed.  Or the opposite - being so fidgety or restless that you have been moving around a lot more than usual    Thoughts that you would be better off dead, or hurting yourself in some way    PHQ2-9 Total Score    If you checked off any problems, how difficult have these problems made it for you to do your work, take care of things at home, or get along with other people    Depression Interventions/Treatment      There were no vitals filed for this visit.  Medications Reviewed Today     Reviewed by Morgan [name], RN (Registered Nurse) on 12/11/23 at 1413  Med List Status: <None>   Medication Order Taking? Sig Documenting Provider Last Dose Status Informant  acetaminophen  (TYLENOL ) 325 MG tablet [id number]  Take 2 tablets (650 mg total) by mouth every 6 (six) hours. [name], MD  Active   alfuzosin  (UROXATRAL ) 10 MG 24 hr tablet [id number]  TAKE 1 TABLET BY MOUTH EVERY DAY Ngetich, [name] C, NP  Active   ALPRAZolam  (XANAX ) 0.5 MG tablet [id number]  TAKE 1 TABLET(0.5 MG) BY MOUTH TWICE DAILY AS NEEDED FOR ANXIETY OR SLEEP Caro, Jessica K, NP  Active   Cholecalciferol (VITAMIN D3 SUPER STRENGTH) 50 MCG (2000 UT) TABS [id number]  Take 2,000 Units by mouth daily. [provider]  Active   Continuous Glucose Sensor (DEXCOM G7 SENSOR) MISC [id number]  APPLY 1 SENSOR TO SKIN EVERY 10 DAYS AS DIRECTED Shamleffer, Ibtehal Jaralla, MD  Active   Cyanocobalamin  (B-12 PO) [id number]  Take 1 tablet by mouth daily. [provider]  Active Self  diclofenac  Sodium (VOLTAREN ) 1 % GEL [id number]  APPLY 2 GRAMS TO RIGHT NECK/SHOULDER AREA 3-4 TIMES DAILY AS NEEDED Ngetich, [name] C, NP  Active   DULoxetine  (CYMBALTA ) 60 MG capsule [id number]  TAKE 1 CAPSULE(60 MG) BY MOUTH DAILY Ngetich, [name] C, NP  Active   ELIQUIS  5 MG TABS tablet [id number]  TAKE 1 TABLET(5 MG) BY MOUTH TWICE DAILY Klein, Steven C, MD  Active   Ferrous Sulfate  (IRON PO) [id number]  Take 2 tablets by mouth daily. [provider]  Active Self  guaiFENesin  (MUCINEX ) 600 MG 12 hr tablet [id number]  Take 1 tablet (600 mg total) by mouth 2 (two) times daily. Ngetich, [name] C, NP  Active   HYDROcodone -acetaminophen  (NORCO/VICODIN) 5-325 MG tablet [id number]  Take 1 tablet by mouth at bedtime as needed for moderate pain (pain score 4-6). Ngetich, [name] C, NP  Active   insulin  aspart (NOVOLOG ) 100 UNIT/ML injection [id number]  Max daily 100 units Shamleffer, Donell Cardinal, MD  Active Self  Med Note JERALYN DUNCANS A   Wed Jan 30, 2023  1:33 PM) Uses in insulin  pump  isosorbide  mononitrate (IMDUR ) 60 MG 24 hr tablet [id number]  TAKE 1 TABLET(60 MG) BY MOUTH DAILY Ngetich, [name] C, NP  Active   ketoconazole  (NIZORAL ) 2 % cream [id number]  APPLY TOPICALLY TO THE AFFECTED AREA AS NEEDED FOR IRRITATION Ngetich, [name] C, NP  Active Self  methocarbamol  (ROBAXIN ) 500 MG tablet [id number]  TAKE 1 TABLET(500 MG) BY MOUTH EVERY 8 HOURS AS NEEDED FOR MUSCLE SPASMS Ngetich, [name] C, NP  Active   metolazone  (ZAROXOLYN ) 2.5 MG tablet [id number]  Take 1 tablet (2.5 mg total) by mouth daily. Take 1-2 times a week as needed , spacing out 3 days between doses Ngetich, [name] C, NP  Active Self  Multiple Vitamins-Minerals  (CENTRUM ADULT PO) [id number]  Take 1 tablet by mouth daily at 6 (six) AM. [provider]  Active   ondansetron  (ZOFRAN ) 4 MG tablet [id number]  TAKE 1 TABLET(4 MG) BY MOUTH EVERY 8 HOURS AS NEEDED FOR NAUSEA OR VOMITING Ngetich, [name] C, NP  Active   OXYGEN  [id number]  Inhale 2 L into the lungs continuous. [provider]  Active Self  pantoprazole  (PROTONIX ) 40 MG tablet [id number]  Take 1 tablet (40 mg total) by mouth 2 (two) times daily. Medina-Vargas, Monina C, NP  Active   potassium chloride  SA (KLOR-CON  M) 20 MEQ tablet [id number]  Take 1 tablet (20 mEq total) by mouth 2 (two) times daily. Singh, Prashant K, MD  Active Self  Red Yeast Rice 600 MG CAPS [phone]  Take 1,200 mg by mouth daily. [provider]  Active Self  rOPINIRole  (REQUIP ) 2 MG tablet [id number]  TAKE 1 TABLET(2 MG) BY MOUTH AT BEDTIME Ngetich, [name] C, NP  Active   rosuvastatin  (CRESTOR ) 10 MG tablet [id number]  Take 2 tablets (20 mg total) by mouth daily. [name], MD  Active   saccharomyces boulardii (FLORASTOR) 250 MG capsule [id number]  Take 1 capsule (250 mg total) by mouth 2 (two) times daily. Ngetich, [name] C, NP  Active   solifenacin (VESICARE) 5 MG tablet [id number]  Take 1 tablet (5 mg total) by mouth daily. Stoneking, Adine PARAS., MD  Active   tirzepatide  (MOUNJARO ) 10 MG/0.5ML Pen [id number]  Inject 10 mg into the skin once a week. Shamleffer, Donell Cardinal, MD  Active   tiZANidine  (ZANAFLEX ) 2 MG tablet [id number]  TAKE 1 TABLET BY MOUTH TWICE DAILY Ngetich, [name] C, NP  Active   topiramate  (TOPAMAX ) 25 MG tablet [id number]  TAKE 1 TABLET BY MOUTH EVERY DAY Ngetich, [name] C, NP  Active   torsemide (DEMADEX) 100 MG tablet [id number]  Take 100 mg by mouth daily. Patient takes 1/2 tablet in the morning and 1/2 tablet in the afternoon [provider]  Active   traMADol  (ULTRAM ) 50 MG tablet [id number]  TAKE 1 TABLET(50 MG) BY MOUTH TWICE DAILY AS NEEDED. Ngetich, [name] C, NP  Active    traZODone  (DESYREL ) 50 MG tablet [id number]  TAKE 2 TABLETS(100 MG) BY MOUTH AT BEDTIME Ngetich, [name] C, NP  Active   triamcinolone  cream (KENALOG ) 0.1 % [id number]  Apply 1 Application topically as needed. [provider]  Active   Vitamin D , Ergocalciferol , (DRISDOL ) 1.25 MG (50000 UNIT) CAPS capsule [id number]  TAKE 1 CAPSULE BY MOUTH EVERY 7 DAYS Ngetich, Roxan BROCKS, NP  Active   Med List Note Christie Alexander, CPhT 09/07/21 0908): Cpap 12.5 pressure  Recommendation:   Continue Current Plan of Care  Follow Up Plan:   Telephone follow up appointment date/time:  Friday, November 14 at 2:00 PM  Clayborne Ly RN BSN CCM [place]  [facility], [facility] Health Nurse Care Coordinator  Direct Dial: [phone] Website: [email or web address]

## 2023-12-11 NOTE — Patient Instructions (Signed)
 Visit Information  Thank you for taking time to visit with me today. Please don't hesitate to contact me if I can be of assistance to you before our next scheduled appointment.  Your next care management appointment is by telephone on Friday, November 14 at 2:00 PM  Please call the care guide team at [phone] if you need to cancel, schedule, or reschedule an appointment.   Please call 1-800-273-TALK (toll free, 24 hour hotline) if you are experiencing a Mental Health or Behavioral Health Crisis or need someone to talk to.  Clayborne Ly RN BSN CCM [place]  [facility], [facility] Health Nurse Care Coordinator  Direct Dial: [phone] Website: [email or web address]

## 2023-12-12 DIAGNOSIS — Z794 Long term (current) use of insulin: Secondary | ICD-10-CM | POA: Diagnosis not present

## 2023-12-12 DIAGNOSIS — E118 Type 2 diabetes mellitus with unspecified complications: Secondary | ICD-10-CM | POA: Diagnosis not present

## 2023-12-12 DIAGNOSIS — E119 Type 2 diabetes mellitus without complications: Secondary | ICD-10-CM | POA: Diagnosis not present

## 2023-12-12 DIAGNOSIS — K56609 Unspecified intestinal obstruction, unspecified as to partial versus complete obstruction: Secondary | ICD-10-CM | POA: Diagnosis not present

## 2023-12-12 DIAGNOSIS — Z933 Colostomy status: Secondary | ICD-10-CM | POA: Diagnosis not present

## 2023-12-13 ENCOUNTER — Ambulatory Visit (INDEPENDENT_AMBULATORY_CARE_PROVIDER_SITE_OTHER): Payer: Medicare PPO

## 2023-12-13 DIAGNOSIS — Z794 Long term (current) use of insulin: Secondary | ICD-10-CM | POA: Diagnosis not present

## 2023-12-13 DIAGNOSIS — E118 Type 2 diabetes mellitus with unspecified complications: Secondary | ICD-10-CM | POA: Diagnosis not present

## 2023-12-13 DIAGNOSIS — I4819 Other persistent atrial fibrillation: Secondary | ICD-10-CM | POA: Diagnosis not present

## 2023-12-13 DIAGNOSIS — E119 Type 2 diabetes mellitus without complications: Secondary | ICD-10-CM | POA: Diagnosis not present

## 2023-12-13 DIAGNOSIS — K56609 Unspecified intestinal obstruction, unspecified as to partial versus complete obstruction: Secondary | ICD-10-CM | POA: Diagnosis not present

## 2023-12-14 LAB — CUP PACEART REMOTE DEVICE CHECK
Battery Remaining Longevity: 72 mo
Battery Remaining Percentage: 75 %
Battery Voltage: 2.99 V
Brady Statistic AP VP Percent: 51 %
Brady Statistic AP VS Percent: 1 %
Brady Statistic AS VP Percent: 49 %
Brady Statistic AS VS Percent: 1 %
Brady Statistic RA Percent Paced: 48 %
Brady Statistic RV Percent Paced: 99 %
Date Time Interrogation Session: 20251024020019
Implantable Lead Connection Status: 753985
Implantable Lead Connection Status: 753985
Implantable Lead Implant Date: 20230728
Implantable Lead Implant Date: 20230728
Implantable Lead Location: 753859
Implantable Lead Location: 753860
Implantable Lead Model: 3830
Implantable Pulse Generator Implant Date: 20230728
Lead Channel Impedance Value: 440 Ohm
Lead Channel Impedance Value: 530 Ohm
Lead Channel Pacing Threshold Amplitude: 0.75 V
Lead Channel Pacing Threshold Amplitude: 0.75 V
Lead Channel Pacing Threshold Pulse Width: 0.5 ms
Lead Channel Pacing Threshold Pulse Width: 0.5 ms
Lead Channel Sensing Intrinsic Amplitude: 12 mV
Lead Channel Sensing Intrinsic Amplitude: 3.2 mV
Lead Channel Setting Pacing Amplitude: 1.75 V
Lead Channel Setting Pacing Amplitude: 2.5 V
Lead Channel Setting Pacing Pulse Width: 0.5 ms
Lead Channel Setting Sensing Sensitivity: 2.5 mV
Pulse Gen Model: 2272
Pulse Gen Serial Number: 8101667

## 2023-12-15 ENCOUNTER — Other Ambulatory Visit: Payer: Self-pay | Admitting: Family

## 2023-12-15 DIAGNOSIS — F411 Generalized anxiety disorder: Secondary | ICD-10-CM

## 2023-12-16 ENCOUNTER — Ambulatory Visit: Payer: Self-pay | Admitting: Urology

## 2023-12-16 ENCOUNTER — Encounter: Payer: Self-pay | Admitting: Urology

## 2023-12-16 ENCOUNTER — Ambulatory Visit: Payer: Self-pay | Admitting: Cardiology

## 2023-12-16 LAB — HEMATURIA PROFILE

## 2023-12-16 NOTE — Telephone Encounter (Signed)
 Alprazolam  prescription refilled.

## 2023-12-16 NOTE — Telephone Encounter (Signed)
 LOV: 10/15/2023 NOV: 04/21/2024 Last Refill: ALPRAZolam  (XANAX ) 0.5 MG tablet  08/16/23 60 tablets 0 refills

## 2023-12-18 ENCOUNTER — Encounter: Payer: Self-pay | Admitting: Internal Medicine

## 2023-12-18 NOTE — Progress Notes (Signed)
 Remote PPM Transmission

## 2023-12-24 ENCOUNTER — Other Ambulatory Visit: Payer: Self-pay | Admitting: Urology

## 2023-12-24 ENCOUNTER — Encounter: Payer: Self-pay | Admitting: Urology

## 2023-12-24 DIAGNOSIS — N3281 Overactive bladder: Secondary | ICD-10-CM

## 2023-12-24 MED ORDER — SOLIFENACIN SUCCINATE 5 MG PO TABS: 5.0000 mg | ORAL_TABLET | Freq: Every day | ORAL | 3 refills | Status: AC

## 2023-12-26 DIAGNOSIS — G4733 Obstructive sleep apnea (adult) (pediatric): Secondary | ICD-10-CM | POA: Diagnosis not present

## 2023-12-28 DIAGNOSIS — K56609 Unspecified intestinal obstruction, unspecified as to partial versus complete obstruction: Secondary | ICD-10-CM | POA: Diagnosis not present

## 2023-12-28 DIAGNOSIS — Z794 Long term (current) use of insulin: Secondary | ICD-10-CM | POA: Diagnosis not present

## 2023-12-28 DIAGNOSIS — E119 Type 2 diabetes mellitus without complications: Secondary | ICD-10-CM | POA: Diagnosis not present

## 2023-12-28 DIAGNOSIS — E118 Type 2 diabetes mellitus with unspecified complications: Secondary | ICD-10-CM | POA: Diagnosis not present

## 2023-12-31 DIAGNOSIS — G8929 Other chronic pain: Secondary | ICD-10-CM

## 2024-01-01 ENCOUNTER — Other Ambulatory Visit: Payer: Self-pay

## 2024-01-01 MED ORDER — INSULIN ASPART 100 UNIT/ML IJ SOLN: INTRAMUSCULAR | 3 refills | Status: DC

## 2024-01-01 MED ORDER — HYDROCODONE-ACETAMINOPHEN 5-325 MG PO TABS: 1.0000 | ORAL_TABLET | Freq: Every evening | ORAL | 0 refills | Status: DC | PRN

## 2024-01-01 NOTE — Telephone Encounter (Signed)
 Patient is requesting a refill of the following medications: Hydrocodone   Requested Prescriptions    No prescriptions requested or ordered in this encounter    Date of last refill: 11/18/23  Refill amount: 30  Treatment agreement date: No treatment agreement on file, notation made on pending appointment for 04/21/24

## 2024-01-03 ENCOUNTER — Telehealth

## 2024-01-06 ENCOUNTER — Other Ambulatory Visit: Payer: Self-pay | Admitting: Family

## 2024-01-06 DIAGNOSIS — I5022 Chronic systolic (congestive) heart failure: Secondary | ICD-10-CM | POA: Diagnosis not present

## 2024-01-06 DIAGNOSIS — G63 Polyneuropathy in diseases classified elsewhere: Secondary | ICD-10-CM | POA: Diagnosis not present

## 2024-01-06 DIAGNOSIS — R2681 Unsteadiness on feet: Secondary | ICD-10-CM | POA: Diagnosis not present

## 2024-01-06 DIAGNOSIS — I4819 Other persistent atrial fibrillation: Secondary | ICD-10-CM | POA: Diagnosis not present

## 2024-01-13 ENCOUNTER — Other Ambulatory Visit: Payer: Self-pay | Admitting: Family

## 2024-01-15 ENCOUNTER — Other Ambulatory Visit: Payer: Self-pay | Admitting: Family

## 2024-01-15 DIAGNOSIS — B379 Candidiasis, unspecified: Secondary | ICD-10-CM

## 2024-01-16 ENCOUNTER — Other Ambulatory Visit: Payer: Self-pay | Admitting: Family

## 2024-01-17 ENCOUNTER — Other Ambulatory Visit: Payer: Self-pay | Admitting: Family

## 2024-01-17 DIAGNOSIS — E1142 Type 2 diabetes mellitus with diabetic polyneuropathy: Secondary | ICD-10-CM

## 2024-01-22 MED ORDER — ROSUVASTATIN CALCIUM 20 MG PO TABS: 20.0000 mg | ORAL_TABLET | Freq: Every day | ORAL | 1 refills | Status: DC

## 2024-01-23 ENCOUNTER — Telehealth: Payer: Self-pay | Admitting: Oncology

## 2024-01-23 NOTE — Telephone Encounter (Signed)
 I spoke with patient as he called in to cancel his appointments on 12/8/20255 due to them no longer being needed.

## 2024-01-24 DIAGNOSIS — G4733 Obstructive sleep apnea (adult) (pediatric): Secondary | ICD-10-CM | POA: Diagnosis not present

## 2024-01-24 DIAGNOSIS — C642 Malignant neoplasm of left kidney, except renal pelvis: Secondary | ICD-10-CM | POA: Diagnosis not present

## 2024-01-24 DIAGNOSIS — E876 Hypokalemia: Secondary | ICD-10-CM | POA: Diagnosis not present

## 2024-01-24 DIAGNOSIS — D631 Anemia in chronic kidney disease: Secondary | ICD-10-CM | POA: Diagnosis not present

## 2024-01-24 DIAGNOSIS — N1832 Chronic kidney disease, stage 3b: Secondary | ICD-10-CM | POA: Diagnosis not present

## 2024-01-24 DIAGNOSIS — E1122 Type 2 diabetes mellitus with diabetic chronic kidney disease: Secondary | ICD-10-CM | POA: Diagnosis not present

## 2024-01-24 DIAGNOSIS — R600 Localized edema: Secondary | ICD-10-CM | POA: Diagnosis not present

## 2024-01-24 DIAGNOSIS — I130 Hypertensive heart and chronic kidney disease with heart failure and stage 1 through stage 4 chronic kidney disease, or unspecified chronic kidney disease: Secondary | ICD-10-CM | POA: Diagnosis not present

## 2024-01-24 DIAGNOSIS — I509 Heart failure, unspecified: Secondary | ICD-10-CM | POA: Diagnosis not present

## 2024-01-26 ENCOUNTER — Other Ambulatory Visit: Payer: Self-pay | Admitting: Family

## 2024-01-26 DIAGNOSIS — Z933 Colostomy status: Secondary | ICD-10-CM | POA: Diagnosis not present

## 2024-01-26 DIAGNOSIS — E118 Type 2 diabetes mellitus with unspecified complications: Secondary | ICD-10-CM | POA: Diagnosis not present

## 2024-01-26 DIAGNOSIS — K56609 Unspecified intestinal obstruction, unspecified as to partial versus complete obstruction: Secondary | ICD-10-CM | POA: Diagnosis not present

## 2024-01-26 DIAGNOSIS — Z794 Long term (current) use of insulin: Secondary | ICD-10-CM | POA: Diagnosis not present

## 2024-01-26 DIAGNOSIS — E119 Type 2 diabetes mellitus without complications: Secondary | ICD-10-CM | POA: Diagnosis not present

## 2024-01-27 ENCOUNTER — Other Ambulatory Visit

## 2024-01-27 ENCOUNTER — Ambulatory Visit: Admitting: Oncology

## 2024-01-29 ENCOUNTER — Other Ambulatory Visit: Payer: Self-pay

## 2024-01-29 NOTE — Patient Instructions (Signed)
 Visit Information  Thank you for taking time to visit with me today. Please don't hesitate to contact me if I can be of assistance to you before our next scheduled appointment.  Your next care management appointment is by telephone on Tuesday, January 6 at 2:30 PM  Please call the care guide team at [phone] if you need to cancel, schedule, or reschedule an appointment.   Please call 1-800-273-TALK (toll free, 24 hour hotline) go to [facility] Urgent [facility] [address] [place] [phone]) if you are experiencing a Mental Health or Behavioral Health Crisis or need someone to talk to.  Clayborne Ly RN BSN CCM [place]  [facility], [facility] Health Nurse Care Coordinator  Direct Dial: [phone] Website: [email or web address]

## 2024-01-29 NOTE — Patient Outreach (Signed)
 Complex Care Management   Visit Note  01/29/2024  Name:  Jesse [name] MRN: [id number] DOB: 20-Jan-1951  Situation: Referral received for Complex Care Management related to Diabetes with Complications, Chronic Kidney Disease, Atrial Fibrillation, and Benign Hypertension, Chronic Congestive Heart Failure, Renal Cell Carcinoma, Colostomy, Overactive Bladder, Iron Deficiency Anemia, Fatigue, Recurrent UTI, hematuria. I obtained verbal consent from Patient. Visit completed with Patient on the phone.  Background:   Past Medical History:  Diagnosis Date   Alcohol use disorder in remission 1999   Anemia    iron deficiency   Anxiety and depression    Arthritis    ankles, knees and hands   At high risk for falls    Basal cell carcinoma    x4 (on both shoulders, one on forehead, one on nose)   CKD stage 3b, GFR 30-44 ml/min (HCC)    Former cigarette smoker 1999   Generalized anxiety disorder    Hard of hearing    Headache    History of kidney stones    Hyperlipidemia due to type 2 diabetes mellitus (HCC)    Hypertension    Morbid obesity (HCC)    Neuropathy    O2 dependent    2L   Presence of permanent cardiac pacemaker    Renal cell carcinoma of left kidney (HCC)    pt had cryotherapy for this at [facility] 12/11/22)   Risk for falls    Sleep apnea    uses CPAP   TIA (transient ischemic attack)    pt states this was not confirmed due to MRI unable to be performed   Type 2 diabetes mellitus (HCC)    Ventral hernia with bowel obstruction    Per Hospital Encounter on 05/21/22    Assessment: Patient Reported Symptoms:  Cognitive Cognitive Status: Alert and oriented to person, place, and time, Normal speech and language skills Cognitive/Intellectual Conditions Management [RPT]: None reported or documented in medical history or problem list   Health Maintenance Behaviors: Annual physical exam Health Facilitated by: Rest  Neurological Neurological Review of Symptoms: No symptoms  reported    HEENT HEENT Symptoms Reported: No symptoms reported      Cardiovascular Cardiovascular Symptoms Reported: Swelling in legs or feet, Other: Other Cardiovascular Symptoms: elevated BP, ascites, weight gain; educated patient HF action plan Does patient have uncontrolled Hypertension?: No Is patient checking Blood Pressure at home?: Yes Cardiovascular Management Strategies: Adequate rest, Medication therapy, Routine screening Weight: (!) 395 lb (179.2 kg) Cardiovascular Self-Management Outcome: 3 (uncertain)  Respiratory Respiratory Symptoms Reported: Shortness of breath Other Respiratory Symptoms: shortness of breath with transfers Additional Respiratory Details: hx of OSA; Chronic respiratory failure with hypoxia Respiratory Management Strategies: Adequate rest, Oxygen  therapy, Routine screening Respiratory Self-Management Outcome: 3 (uncertain)  Endocrine Endocrine Symptoms Reported: Not assessed    Gastrointestinal Gastrointestinal Symptoms Reported: Other, Distention Gastrointestinal Management Strategies: Colostomy Gastrointestinal Self-Management Outcome: 4 (good)    Genitourinary Genitourinary Symptoms Reported: Blood in urine, Other Other Genitourinary Symptoms: dark colored urine times 2 days Genitourinary Management Strategies: Fluid modification, Medication therapy Genitourinary Self-Management Outcome: 4 (good)  Integumentary Integumentary Symptoms Reported: Not assessed    Musculoskeletal Musculoskelatal Symptoms Reviewed: Difficulty walking, Limited mobility Musculoskeletal Management Strategies: Medical device, Routine screening, Adequate rest Musculoskeletal Self-Management Outcome: 4 (good) Falls in the past year?: No Number of falls in past year: 1 or less Was there an injury with Fall?: No Fall Risk Category Calculator: 0 Patient Fall Risk Level: Low Fall Risk Patient at Risk for Falls Due  to: Impaired mobility, Impaired balance/gait  Psychosocial  Psychosocial Symptoms Reported: Not assessed   Major Change/Loss/Stressor/Fears (CP): Medical condition, self Techniques to Cope with Loss/Stress/Change: Diversional activities Quality of Family Relationships: helpful, involved, supportive Do you feel physically threatened by others?: No    01/29/2024    PHQ2-9 Depression Screening   Jesse [name] interest or pleasure in doing things    Feeling down, depressed, or hopeless    PHQ-2 - Total Score    Trouble falling or staying asleep, or sleeping too much    Feeling tired or having Jesse [name] energy    Poor appetite or overeating     Feeling bad about yourself - or that you are a failure or have let yourself or your family down    Trouble concentrating on things, such as reading the newspaper or watching television    Moving or speaking so slowly that other people could have noticed.  Or the opposite - being so fidgety or restless that you have been moving around a lot more than usual    Thoughts that you would be better off dead, or hurting yourself in some way    PHQ2-9 Total Score    If you checked off any problems, how difficult have these problems made it for you to do your work, take care of things at home, or get along with other people    Depression Interventions/Treatment      Today's Vitals   01/29/24 1541  BP: (!) 147/78  Weight: (!) 395 lb (179.2 kg)   Pain Scale: Not given for pain  Medications Reviewed Today     Reviewed by Morgan [name], RN (Registered Nurse) on 01/29/24 at 1506  Med List Status: <None>   Medication Order Taking? Sig Documenting Provider Last Dose Status Informant  acetaminophen  (TYLENOL ) 325 MG tablet [id number]  Take 2 tablets (650 mg total) by mouth every 6 (six) hours. [name], MD  Active   alfuzosin  (UROXATRAL ) 10 MG 24 hr tablet [id number]  TAKE 1 TABLET BY MOUTH EVERY DAY Ngetich, [name] C, NP  Active   ALPRAZolam  (XANAX ) 0.5 MG tablet [id number]  TAKE 1 TABLET(0.5 MG) BY MOUTH TWICE DAILY AS  NEEDED FOR ANXIETY OR SLEEP Ngetich, [name] C, NP  Active   amLODipine (NORVASC) 2.5 MG tablet [id number] Yes Take 2.5 mg by mouth daily.  Patient taking differently: Take 5 mg by mouth daily.   [provider]  Consider Medication Status and Discontinue (Change in therapy)   amLODipine (NORVASC) 5 MG tablet [id number] Yes Take 5 mg by mouth daily. [provider]  Active   Cholecalciferol (VITAMIN D3 SUPER STRENGTH) 50 MCG (2000 UT) TABS [id number]  Take 2,000 Units by mouth daily. [provider]  Active   Continuous Glucose Sensor (DEXCOM G7 SENSOR) MISC [id number]  APPLY 1 SENSOR TO SKIN EVERY 10 DAYS AS DIRECTED Shamleffer, Ibtehal Jaralla, MD  Active   Cyanocobalamin  (B-12 PO) [id number]  Take 1 tablet by mouth daily. [provider]  Active Self  diclofenac  Sodium (VOLTAREN ) 1 % GEL [id number]  APPLY 2 GRAMS TO RIGHT NECK/SHOULDER AREA 3-4 TIMES DAILY AS NEEDED Ngetich, [name] C, NP  Active   DULoxetine  (CYMBALTA ) 60 MG capsule [id number]  TAKE 1 CAPSULE(60 MG) BY MOUTH DAILY Ngetich, [name] C, NP  Active   ELIQUIS  5 MG TABS tablet [id number]  TAKE 1 TABLET(5 MG) BY MOUTH TWICE DAILY [name], MD  Active   Ferrous Sulfate  (IRON PO) [id number]  Take 2 tablets by  mouth daily. [provider]  Active Self  guaiFENesin  (MUCINEX ) 600 MG 12 hr tablet [id number]  Take 1 tablet (600 mg total) by mouth 2 (two) times daily. Ngetich, [name] C, NP  Active   HYDROcodone -acetaminophen  (NORCO/VICODIN) 5-325 MG tablet [id number]  Take 1 tablet by mouth at bedtime as needed for moderate pain (pain score 4-6). Ngetich, [name] C, NP  Active   insulin  aspart (NOVOLOG ) 100 UNIT/ML injection [id number]  Max daily 100 units Shamleffer, Ibtehal Jaralla, MD  Active   isosorbide  mononitrate (IMDUR ) 60 MG 24 hr tablet [id number] Yes TAKE 1 TABLET(60 MG) BY MOUTH DAILY  Patient taking differently: 60 mg 2 (two) times daily.   Ngetich, [name] C, NP  Active   ketoconazole  (NIZORAL ) 2 %  cream [id number]  APPLY TOPICALLY TO THE AFFECTED AREA AS NEEDED FOR IRRITATION Ngetich, [name] C, NP  Active   methocarbamol  (ROBAXIN ) 500 MG tablet [id number]  TAKE 1 TABLET(500 MG) BY MOUTH EVERY 8 HOURS AS NEEDED FOR MUSCLE SPASMS Ngetich, [name] C, NP  Active   metolazone  (ZAROXOLYN ) 2.5 MG tablet [id number] Yes Take 1 tablet (2.5 mg total) by mouth daily. Take 1-2 times a week as needed , spacing out 3 days between doses  Patient taking differently: Take 2.5 mg by mouth daily. Take 2 tabs 2 times a week as needed , spacing out 3 days between doses   Ngetich, [name] C, NP  Active Self  Multiple Vitamins-Minerals (CENTRUM ADULT PO) [id number]  Take 1 tablet by mouth daily at 6 (six) AM. [provider]  Active   ondansetron  (ZOFRAN ) 4 MG tablet [id number]  TAKE 1 TABLET(4 MG) BY MOUTH EVERY 8 HOURS AS NEEDED FOR NAUSEA OR VOMITING Ngetich, [name] C, NP  Active   OXYGEN  [id number]  Inhale 2 L into the lungs continuous. [provider]  Active Self  pantoprazole  (PROTONIX ) 40 MG tablet [id number]  Take 1 tablet (40 mg total) by mouth 2 (two) times daily. Medina-Vargas, Monina C, NP  Active   potassium chloride  SA (KLOR-CON  M) 20 MEQ tablet [id number]  Take 1 tablet (20 mEq total) by mouth 2 (two) times daily. Singh, Prashant K, MD  Active Self  Red Yeast Rice 600 MG CAPS [phone]  Take 1,200 mg by mouth daily. [provider]  Active Self  rOPINIRole  (REQUIP ) 2 MG tablet [id number]  TAKE 1 TABLET(2 MG) BY MOUTH AT BEDTIME Ngetich, [name] C, NP  Active   rosuvastatin  (CRESTOR ) 20 MG tablet [id number]  Take 1 tablet (20 mg total) by mouth daily. Ngetich, [name] C, NP  Active   saccharomyces boulardii (FLORASTOR) 250 MG capsule [id number]  Take 1 capsule (250 mg total) by mouth 2 (two) times daily. Ngetich, [name] C, NP  Active   solifenacin  (VESICARE ) 5 MG tablet [id number]  Take 1 tablet (5 mg total) by mouth daily. Stoneking, Adine PARAS., MD  Active   tirzepatide  (MOUNJARO ) 10 MG/0.5ML Pen  [id number]  Inject 10 mg into the skin once a week. Shamleffer, Donell Cardinal, MD  Active   tiZANidine  (ZANAFLEX ) 2 MG tablet [id number]  TAKE 1 TABLET BY MOUTH TWICE DAILY Ngetich, [name] C, NP  Active   topiramate  (TOPAMAX ) 25 MG tablet [id number]  TAKE 1 TABLET BY MOUTH EVERY DAY Ngetich, [name] C, NP  Active   torsemide (DEMADEX) 100 MG tablet [id number]  Take 100 mg by mouth daily. Patient takes 1/2 tablet in the morning and 1/2 tablet in the afternoon [provider]  Active   traMADol  (ULTRAM ) 50 MG  tablet [id number]  TAKE 1 TABLET(50 MG) BY MOUTH TWICE DAILY AS NEEDED. Ngetich, [name] C, NP  Active   traZODone  (DESYREL ) 50 MG tablet [id number]  TAKE 2 TABLETS(100 MG) BY MOUTH AT BEDTIME Ngetich, [name] C, NP  Active   triamcinolone  cream (KENALOG ) 0.1 % [id number]  Apply 1 Application topically as needed. [provider]  Active   Vitamin D , Ergocalciferol , (DRISDOL ) 1.25 MG (50000 UNIT) CAPS capsule [id number]  TAKE 1 CAPSULE BY MOUTH EVERY 7 DAYS Ngetich, Roxan BROCKS, NP  Active   Med List Note Christie Alexander, CPhT 09/07/21 0908): Cpap 12.5 pressure            Recommendation:   Specialty provider follow-up   03/09/2024 Status: Sch   Time: 10:10 AM Length: 20  Visit Type: OFFICE VISIT SPECIALTY [753] Copay: $35.00  Provider: Sam Donell Cardinal, MD Department: [facility]    03/10/2024 Status: Sch   Time: 8:45 AM Length: 15  Visit Type: OFFICE VISIT [1004] Copay: $35.00  Provider: Roseann Adine PARAS., MD Department: [facility] [place]   Follow Up Plan:   Telephone follow up appointment date/time   02/25/2024 Status: Sch   Time: 2:30 PM Length: 30  Visit Type: VBCI TELEPHONE CALL 30 [2502] Copay: $0.00  Provider: Morgan [name], RN Department: [facility]   Clayborne Morgan RN BSN CCM [place]  [facility], [facility] Nurse Care Coordinator  Direct Dial: [phone] Website: [email or web address]

## 2024-01-30 ENCOUNTER — Other Ambulatory Visit: Payer: Self-pay | Admitting: Family

## 2024-01-30 DIAGNOSIS — I5022 Chronic systolic (congestive) heart failure: Secondary | ICD-10-CM

## 2024-01-30 NOTE — Telephone Encounter (Signed)
 High risk warning

## 2024-01-31 ENCOUNTER — Telehealth: Payer: Self-pay

## 2024-01-31 DIAGNOSIS — I5022 Chronic systolic (congestive) heart failure: Secondary | ICD-10-CM

## 2024-01-31 NOTE — Telephone Encounter (Signed)
 This medication was refilled yesterday by PCP. Outgoing call placed to patient, patient states he is taking medication twice weekly and it is prescribed by his nephrologist, Dr.Bandhari. Patient stated he does not need medication at this time.  I called [facility] and left a message with the above.

## 2024-01-31 NOTE — Telephone Encounter (Signed)
 Please verify with patient if still taking as directed by cardiologist.

## 2024-01-31 NOTE — Telephone Encounter (Signed)
 Incoming fax received from [facility] requesting clarification on Metolazone  2.5 mg rx. RX has multiple instructions. Please review and re-submit rx

## 2024-02-03 NOTE — Progress Notes (Unsigned)
 Cardiology Office Note:   Date:  02/04/2024  ID:  Jesse [name], DOB 1950/09/14, MRN [id number] PCP:  [name], NP  [facility] Providers Cardiologist:  Wendel Haws, MD Referring MD: [name], NP  Chief Complaint/Reason for Referral: Follow-up diastolic heart failure ASSESSMENT:    1. Persistent atrial fibrillation (HCC)   2. Hypercoagulable state due to persistent atrial fibrillation (HCC)   3. Presence of permanent cardiac pacemaker   4. Chronic diastolic heart failure (HCC)   5. Aortic dilatation   6. Type 2 diabetes mellitus without complication, with long-term current use of insulin  (HCC)   7. Hypertension associated with diabetes (HCC)   8. Hyperlipidemia associated with type 2 diabetes mellitus (HCC)   9. CKD stage 3 due to type 2 diabetes mellitus (HCC)   10. BMI 50.0-59.9, adult (HCC)     PLAN:   In order of problems listed above: Persistent atrial fibrillation: Followed by EP.  Continue Eliquis  5 mg twice daily Hypercoagulable state: Continue Eliquis  5 mg twice daily Status post PPM: Followed by EP.  Last interrogation demonstrated less than 1% AF burden. Chronic diastolic heart failure: Will restart Jardiance  10 mg as the patient thought this was replaced by Eliquis .  Will reach out to Dr. Dolan patient's nephrologist about starting losartan  and discontinuing amlodipine.  Continue metolazone  10 mg twice a week, torsemide 50 mg twice daily obtain echocardiogram to evaluate further. Aortic dilatation: Seen on echocardiogram in 2023.  Obtain echocardiogram now to evaluate further T2DM: Continue Eliquis  5 mg twice daily, start losartan  25 mg, continue rosuvastatin  20 mg, restart Jardiance  10 mg daily, I will reach out to nephrology about an ARB. Hypertension: Continue amlodipine 5 mg; start ARB possibly as detailed above. Hyperlipidemia: LDL was 59 in August.  Continue rosuvastatin  20 mg.  Check LP(a) today CKD stage IIIb: Restart Jardiance  10 mg  daily; ARB as above Elevated BMI: Continue Mounjaro  10 mg q. weekly            Dispo:  Return in about 6 months (around 08/04/2024).       I spent 38 minutes reviewing all clinical data during and prior to this visit including all relevant imaging studies, laboratories, clinical information from other health systems and prior notes from both Cardiology and other specialties, interviewing the patient, conducting a complete physical examination, and coordinating care in order to formulate a comprehensive and personalized evaluation and treatment plan.   History of Present Illness:    FOCUSED PROBLEM LIST:   Persistent atrial fibrillation  Symptomatic bradycardia  Status post PPM  Followed by EP  Chronic diastolic heart failure G1 DD, severe LVH, no significant valve issues, EF 70 to 75% TTE 2023 Aortic dilatation 43 mm TTE 2023 T2DM  On insulin  Hypertension Hyperlipidemia CKD stage IIIb History of TIA Left renal cell carcinoma Status post cryoablation 2024 On supplemental oxygen  Due to body habitus/larger hernia/left pleural effusion BMI 53  December 2025:  Patient consents to use of AI scribe. The patient returns for routine cardiology follow-up.  Patient was last seen in March of this year.  At that point in time no changes were made to his medical regimen.  Over the past two weeks, he has experienced elevated blood pressure readings, with systolic measurements ranging from 150-170 mmHg and diastolic measurements from 70-80 mmHg. This is unusual compared to his previously well-controlled blood pressure.  He is on oxygen  therapy at two liters, which he has been using for several years. 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 Without oxygen , his saturation drops into the low 90s or high 80s with exertion, causing him to pant. He can manage without oxygen  while sitting for extended periods.  He experiences  chronic pain in his knee and left ankle, with severe restless leg syndrome at night, particularly affecting his right leg. This pain disrupts his sleep, sometimes keeping him awake until 4 or 5 AM. He uses a topical aspirin  cream for relief. 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 He keeps his legs elevated most of the time to manage edema, which is consistently worse in the left leg.  He is currently on water pills and reports making a good amount of urine.     Current Medications: Active Medications[1]   Review of Systems:   Please see the history of present illness.    All other systems reviewed and are negative.     EKGs/Labs/Other Test Reviewed:   EKG: September 2025 AV pacing  EKG Interpretation Date/Time:    Ventricular Rate:    PR Interval:    QRS Duration:    QT Interval:    QTC Calculation:   R Axis:      Text Interpretation:          CARDIAC STUDIES: Refer to CV Procedures and Imaging Tabs   Risk Assessment/Calculations:    CHA2DS2-VASc Score = 7   This indicates a 11.2% annual risk of stroke. The patient's score is based upon: CHF History: 1 HTN History: 1 Diabetes History: 1 Stroke History: 2 Vascular Disease History: 1 Age Score: 1 Gender Score: 0         Physical Exam:   VS:  BP (!) 120/48 (BP Location: Left Arm, Patient Position: Sitting, Cuff Size: Large)   Pulse 86   Ht 6' (1.829 m)   Wt (!) 407 lb (184.6 kg)   SpO2 91%   BMI 55.20 kg/m        Wt Readings from Last 3 Encounters:  02/04/24 (!) 407 lb (184.6 kg)  01/29/24 (!) 395 lb (179.2 kg)  11/01/23 (!) 400 lb (181.4 kg)      GENERAL:  No apparent distress, AOx3 HEENT:  No carotid bruits, +2 carotid impulses, no scleral icterus CAR: RRR no murmurs, gallops, rubs, or thrills RES:  Clear to auscultation bilaterally ABD: Large left lower abdominal hernia  VASC:  +2 radial pulses, +2 carotid pulses NEURO:  CN  2-12 grossly intact; motor and sensory grossly intact PSYCH:  No active depression or anxiety EXT:  +2  edema left leg (chronic), no ecchymosis, or cyanosis  Signed, Lurena MARLA Red, MD  02/04/2024 10:06 AM    [facility] [address] [place], [place], [place]  [address] Phone: [phone]; Fax: [phone]   Note:  This document was prepared using Dragon voice recognition software and may include unintentional dictation errors.     [1]  Current Meds  Medication Sig   acetaminophen  (TYLENOL ) 325 MG tablet Take 2 tablets (650 mg total) by mouth every 6 (six) hours.   alfuzosin  (UROXATRAL ) 10 MG 24 hr tablet TAKE 1 TABLET BY MOUTH EVERY DAY   ALPRAZolam  (XANAX ) 0.5 MG tablet TAKE 1 TABLET(0.5 MG) BY MOUTH TWICE DAILY AS NEEDED FOR ANXIETY OR SLEEP   amLODipine (NORVASC) 2.5 MG tablet Take 2.5 mg by  mouth daily. (Patient taking differently: Take 5 mg by mouth daily.)   amLODipine (NORVASC) 5 MG tablet Take 5 mg by mouth daily.   Cholecalciferol (VITAMIN D3 SUPER STRENGTH) 50 MCG (2000 UT) TABS Take 2,000 Units by mouth daily.   Continuous Glucose Sensor (DEXCOM G7 SENSOR) MISC APPLY 1 SENSOR TO SKIN EVERY 10 DAYS AS DIRECTED   Cyanocobalamin  (B-12 PO) Take 1 tablet by mouth daily.   diclofenac  Sodium (VOLTAREN ) 1 % GEL APPLY 2 GRAMS TO RIGHT NECK/SHOULDER AREA 3-4 TIMES DAILY AS NEEDED   DULoxetine  (CYMBALTA ) 60 MG capsule TAKE 1 CAPSULE(60 MG) BY MOUTH DAILY   ELIQUIS  5 MG TABS tablet TAKE 1 TABLET(5 MG) BY MOUTH TWICE DAILY   empagliflozin  (JARDIANCE ) 10 MG TABS tablet Take 1 tablet (10 mg total) by mouth daily before breakfast.   Ferrous Sulfate  (IRON PO) Take 2 tablets by mouth daily.   HYDROcodone -acetaminophen  (NORCO/VICODIN) 5-325 MG tablet Take 1 tablet by mouth at bedtime as needed for moderate pain (pain score 4-6).   insulin  aspart (NOVOLOG ) 100 UNIT/ML injection Max daily 100 units   isosorbide  mononitrate (IMDUR ) 60 MG 24 hr tablet TAKE 1 TABLET(60 MG)  BY MOUTH DAILY (Patient taking differently: Take 60 mg by mouth 2 (two) times daily.)   ketoconazole  (NIZORAL ) 2 % cream APPLY TOPICALLY TO THE AFFECTED AREA AS NEEDED FOR IRRITATION (Patient taking differently: Apply 1 Application topically daily as needed for irritation.)   methocarbamol  (ROBAXIN ) 500 MG tablet TAKE 1 TABLET(500 MG) BY MOUTH EVERY 8 HOURS AS NEEDED FOR MUSCLE SPASMS   metolazone  (ZAROXOLYN ) 2.5 MG tablet Take 2 tabs 2 times a week prescribed by nephrologist (Patient taking differently: Take 5 mg by mouth 2 (two) times a week. Take 2 tabs 2 times a week prescribed by nephrologist)   Multiple Vitamins-Minerals (CENTRUM ADULT PO) Take 1 tablet by mouth daily at 6 (six) AM.   ondansetron  (ZOFRAN ) 4 MG tablet TAKE 1 TABLET(4 MG) BY MOUTH EVERY 8 HOURS AS NEEDED FOR NAUSEA OR VOMITING (Patient taking differently: Take 4 mg by mouth every 8 (eight) hours as needed for nausea.)   OXYGEN  Inhale 2 L into the lungs continuous.   pantoprazole  (PROTONIX ) 40 MG tablet Take 1 tablet (40 mg total) by mouth 2 (two) times daily.   potassium chloride  SA (KLOR-CON  M) 20 MEQ tablet Take 1 tablet (20 mEq total) by mouth 2 (two) times daily.   Red Yeast Rice 600 MG CAPS Take 1,200 mg by mouth daily.   rOPINIRole  (REQUIP ) 2 MG tablet TAKE 1 TABLET(2 MG) BY MOUTH AT BEDTIME   rosuvastatin  (CRESTOR ) 20 MG tablet Take 1 tablet (20 mg total) by mouth daily.   solifenacin  (VESICARE ) 5 MG tablet Take 1 tablet (5 mg total) by mouth daily.   tirzepatide  (MOUNJARO ) 10 MG/0.5ML Pen Inject 10 mg into the skin once a week.   tiZANidine  (ZANAFLEX ) 2 MG tablet TAKE 1 TABLET BY MOUTH TWICE DAILY (Patient taking differently: Take 2 mg by mouth daily in the afternoon.)   topiramate  (TOPAMAX ) 25 MG tablet TAKE 1 TABLET BY MOUTH EVERY DAY   torsemide (DEMADEX) 100 MG tablet Take 100 mg by mouth daily. Patient takes 1/2 tablet in the morning and 1/2 tablet in the afternoon (Patient taking differently: Take 50 mg by mouth 2  (two) times daily. Patient takes 1/2 tablet in the morning and 1/2 tablet in the afternoon)   traMADol  (ULTRAM ) 50 MG tablet TAKE 1 TABLET(50 MG) BY MOUTH TWICE DAILY AS NEEDED. (Patient taking  differently: Take 50 mg by mouth every 12 (twelve) hours as needed. TAKE 1 TABLET(50 MG) BY MOUTH TWICE DAILY AS NEEDED)   traZODone  (DESYREL ) 50 MG tablet TAKE 2 TABLETS(100 MG) BY MOUTH AT BEDTIME   triamcinolone  cream (KENALOG ) 0.1 % Apply 1 Application topically as needed.   Vitamin D , Ergocalciferol , (DRISDOL ) 1.25 MG (50000 UNIT) CAPS capsule TAKE 1 CAPSULE BY MOUTH EVERY 7 DAYS

## 2024-02-04 ENCOUNTER — Ambulatory Visit: Attending: Internal Medicine | Admitting: Internal Medicine

## 2024-02-04 ENCOUNTER — Encounter: Payer: Self-pay | Admitting: Internal Medicine

## 2024-02-04 VITALS — BP 120/48 | HR 86 | Ht 72.0 in | Wt >= 6400 oz

## 2024-02-04 DIAGNOSIS — Z794 Long term (current) use of insulin: Secondary | ICD-10-CM

## 2024-02-04 DIAGNOSIS — Z950 Presence of cardiac pacemaker: Secondary | ICD-10-CM

## 2024-02-04 DIAGNOSIS — Z6841 Body Mass Index (BMI) 40.0 and over, adult: Secondary | ICD-10-CM

## 2024-02-04 DIAGNOSIS — E1159 Type 2 diabetes mellitus with other circulatory complications: Secondary | ICD-10-CM

## 2024-02-04 DIAGNOSIS — I5032 Chronic diastolic (congestive) heart failure: Secondary | ICD-10-CM

## 2024-02-04 DIAGNOSIS — I4819 Other persistent atrial fibrillation: Secondary | ICD-10-CM

## 2024-02-04 DIAGNOSIS — E785 Hyperlipidemia, unspecified: Secondary | ICD-10-CM

## 2024-02-04 DIAGNOSIS — E1169 Type 2 diabetes mellitus with other specified complication: Secondary | ICD-10-CM

## 2024-02-04 DIAGNOSIS — E119 Type 2 diabetes mellitus without complications: Secondary | ICD-10-CM

## 2024-02-04 DIAGNOSIS — N183 Chronic kidney disease, stage 3 unspecified: Secondary | ICD-10-CM

## 2024-02-04 DIAGNOSIS — D6869 Other thrombophilia: Secondary | ICD-10-CM

## 2024-02-04 DIAGNOSIS — I152 Hypertension secondary to endocrine disorders: Secondary | ICD-10-CM

## 2024-02-04 DIAGNOSIS — E1122 Type 2 diabetes mellitus with diabetic chronic kidney disease: Secondary | ICD-10-CM

## 2024-02-04 DIAGNOSIS — I77819 Aortic ectasia, unspecified site: Secondary | ICD-10-CM

## 2024-02-04 MED ORDER — EMPAGLIFLOZIN 10 MG PO TABS: 10.0000 mg | ORAL_TABLET | Freq: Every day | ORAL | 3 refills | Status: AC

## 2024-02-04 NOTE — Patient Instructions (Signed)
 Medication Instructions:  START Jardiance  10 mg once daily   *If you need a refill on your cardiac medications before your next appointment, please call your pharmacy*  Lab Work: To be completed today: lipoprotein-a  If you have labs (blood work) drawn today and your tests are completely normal, you will receive your results only by: MyChart Message (if you have MyChart) OR A paper copy in the mail If you have any lab test that is abnormal or we need to change your treatment, we will call you to review the results.  Testing/Procedures: Your physician has requested that you have an echocardiogram. Echocardiography is a painless test that uses sound waves to create images of your heart. It provides your doctor with information about the size and shape of your heart and how well your heart's chambers and valves are working. This procedure takes approximately one hour. There are no restrictions for this procedure. Please do NOT wear cologne, perfume, aftershave, or lotions (deodorant is allowed). Please arrive 15 minutes prior to your appointment time.  Please note: We ask at that you not bring children with you during ultrasound (echo/ vascular) testing. Due to room size and safety concerns, children are not allowed in the ultrasound rooms during exams. Our front office staff cannot provide observation of children in our lobby area while testing is being conducted. An adult accompanying a patient to their appointment will only be allowed in the ultrasound room at the discretion of the ultrasound technician under special circumstances. We apologize for any inconvenience.   Follow-Up: At [facility], you and your health needs are our priority.  As part of our continuing mission to provide you with exceptional heart care, our providers are all part of one team.  This team includes your primary Cardiologist (physician) and Advanced Practice Providers or APPs (Physician Assistants and Nurse  Practitioners) who all work together to provide you with the care you need, when you need it.  Your next appointment:   6 month(s)  Provider:   One of our Advanced Practice Providers (APPs): Morse Clause, PA-C  Lamarr Satterfield, NP Miriam Shams, NP  Olivia Pavy, PA-C Josefa Beauvais, NP  Leontine Salen, PA-C Orren Fabry, PA-C  [place], PA-C Ernest Dick, NP  Damien Braver, NP Jon Hails, PA-C  Waddell Donath, PA-C    Dayna Dunn, PA-C  Scott Weaver, PA-C Lum Louis, NP Katlyn West, NP Callie Goodrich, PA-C  Xika Zhao, NP Sheng Haley, PA-C    Kathleen Johnson, PA-C

## 2024-02-05 ENCOUNTER — Ambulatory Visit: Payer: Self-pay | Admitting: Internal Medicine

## 2024-02-05 DIAGNOSIS — E785 Hyperlipidemia, unspecified: Secondary | ICD-10-CM

## 2024-02-05 LAB — LIPOPROTEIN A (LPA): Lipoprotein (a): 210.9 nmol/L — ABNORMAL HIGH (ref <75.0)

## 2024-02-06 DIAGNOSIS — G8929 Other chronic pain: Secondary | ICD-10-CM

## 2024-02-06 MED ORDER — HYDROCODONE-ACETAMINOPHEN 5-325 MG PO TABS: 1.0000 | ORAL_TABLET | Freq: Every evening | ORAL | 0 refills | Status: DC | PRN

## 2024-02-06 NOTE — Telephone Encounter (Signed)
 Patient is requesting a refill of the following medications: Requested Prescriptions   Pending Prescriptions Disp Refills   HYDROcodone -acetaminophen  (NORCO/VICODIN) 5-325 MG tablet 30 tablet 0    Sig: Take 1 tablet by mouth at bedtime as needed for moderate pain (pain score 4-6).    Date of last refill: 01/01/24  Refill amount: 30  Treatment agreement date: No treatment agreement on file, notation made on pending appointment, 04/21/24

## 2024-02-10 MED ORDER — ISOSORBIDE MONONITRATE ER 60 MG PO TB24: 60.0000 mg | ORAL_TABLET | Freq: Two times a day (BID) | ORAL | 1 refills | Status: AC

## 2024-02-10 MED ORDER — ROSUVASTATIN CALCIUM 40 MG PO TABS: 40.0000 mg | ORAL_TABLET | Freq: Every day | ORAL | 3 refills | Status: AC

## 2024-02-12 ENCOUNTER — Other Ambulatory Visit

## 2024-02-12 NOTE — Patient Outreach (Addendum)
 Complex Care Management   Visit Note  02/12/2024  Name:  Jesse [name] MRN: [id number] DOB: 1951/02/16  Situation: Referral received for Complex Care Management related to Diabetes with Complications, Chronic Kidney Disease, Atrial Fibrillation, and Benign Hypertension, Chronic Congestive Heart Failure, Renal Cell Carcinoma, Colostomy, Overactive Bladder, Iron Deficiency Anemia, Fatigue, Recurrent UTI, hematuria. I obtained verbal consent from Patient. Visit completed with Patient on the phone.  Background:   Past Medical History:  Diagnosis Date   Alcohol use disorder in remission 1999   Anemia    iron deficiency   Anxiety and depression    Arthritis    ankles, knees and hands   At high risk for falls    Basal cell carcinoma    x4 (on both shoulders, one on forehead, one on nose)   CKD stage 3b, GFR 30-44 ml/min (HCC)    Former cigarette smoker 1999   Generalized anxiety disorder    Hard of hearing    Headache    History of kidney stones    Hyperlipidemia due to type 2 diabetes mellitus (HCC)    Hypertension    Morbid obesity (HCC)    Neuropathy    O2 dependent    2L   Presence of permanent cardiac pacemaker    Renal cell carcinoma of left kidney (HCC)    pt had cryotherapy for this at [facility] 12/11/22)   Risk for falls    Sleep apnea    uses CPAP   TIA (transient ischemic attack)    pt states this was not confirmed due to MRI unable to be performed   Type 2 diabetes mellitus (HCC)    Ventral hernia with bowel obstruction    Per Hospital Encounter on 05/21/22    Assessment: Patient Reported Symptoms:  Cognitive Cognitive Status: Alert and oriented to person, place, and time, Normal speech and language skills Cognitive/Intellectual Conditions Management [RPT]: None reported or documented in medical history or problem list   Health Maintenance Behaviors: Annual physical exam Health Facilitated by: Rest  Neurological Neurological Review of Symptoms:  Numbness Neurological Management Strategies: Routine screening Neurological Self-Management Outcome: 4 (good) Neurological Comment: finger tips  HEENT HEENT Symptoms Reported: Not assessed      Cardiovascular Cardiovascular Symptoms Reported: Swelling in legs or feet, Dizziness Other Cardiovascular Symptoms: ascites Does patient have uncontrolled Hypertension?: No Is patient checking Blood Pressure at home?: Yes Cardiovascular Management Strategies: Adequate rest, Routine screening, Medication therapy, Medical device Cardiovascular Self-Management Outcome: 3 (uncertain)  Respiratory Respiratory Symptoms Reported: Shortness of breath Other Respiratory Symptoms: shortness of breath with transfers Additional Respiratory Details: hx of OSA; Chronic respiratory failure w/hypoxia Respiratory Management Strategies: Adequate rest, Oxygen  therapy, Routine screening, Coping strategies Respiratory Self-Management Outcome: 4 (good)  Endocrine Endocrine Symptoms Reported: Not assessed    Gastrointestinal Gastrointestinal Symptoms Reported: Distention, Nausea, Abdominal pain or discomfort Additional Gastrointestinal Details: ascites Gastrointestinal Management Strategies: Colostomy    Genitourinary Genitourinary Symptoms Reported: Not assessed    Integumentary Integumentary Symptoms Reported: Not assessed    Musculoskeletal Musculoskelatal Symptoms Reviewed: Difficulty walking, Limited mobility, Unsteady gait, Weakness Musculoskeletal Management Strategies: Medical device, Routine screening, Adequate rest Musculoskeletal Self-Management Outcome: 3 (uncertain) Falls in the past year?: No Number of falls in past year: 1 or less Was there an injury with Fall?: No Fall Risk Category Calculator: 0 Patient Fall Risk Level: Low Fall Risk    Psychosocial Psychosocial Symptoms Reported: No symptoms reported   Major Change/Loss/Stressor/Fears (CP): Medical condition, self Techniques to Cope with  Loss/Stress/Change:  Diversional activities (support system)      02/12/2024    PHQ2-9 Depression Screening   Jesse [name] interest or pleasure in doing things    Feeling down, depressed, or hopeless    PHQ-2 - Total Score    Trouble falling or staying asleep, or sleeping too much    Feeling tired or having Jesse [name] energy    Poor appetite or overeating     Feeling bad about yourself - or that you are a failure or have let yourself or your family down    Trouble concentrating on things, such as reading the newspaper or watching television    Moving or speaking so slowly that other people could have noticed.  Or the opposite - being so fidgety or restless that you have been moving around a lot more than usual    Thoughts that you would be better off dead, or hurting yourself in some way    PHQ2-9 Total Score    If you checked off any problems, how difficult have these problems made it for you to do your work, take care of things at home, or get along with other people    Depression Interventions/Treatment      Today's Vitals   02/12/24 1051  BP: (!) 144/78  Pulse: 79   Pain Scale: Not given for pain  Medications Reviewed Today     Reviewed by Jesse [name], RN (Registered Nurse) on 02/12/24 at 1049  Med List Status: <None>   Medication Order Taking? Sig Documenting Provider Last Dose Status Informant  acetaminophen  (TYLENOL ) 325 MG tablet [id number]  Take 2 tablets (650 mg total) by mouth every 6 (six) hours. Jesse [name]  Active   alfuzosin  (UROXATRAL ) 10 MG 24 hr tablet [id number]  TAKE 1 TABLET BY MOUTH EVERY DAY Jesse [name], Jesse [name]  Active   ALPRAZolam  (XANAX ) 0.5 MG tablet [id number]  TAKE 1 TABLET(0.5 MG) BY MOUTH TWICE DAILY AS NEEDED FOR ANXIETY OR SLEEP Jesse [name], Jesse [name]  Active   amLODipine (NORVASC) 2.5 MG tablet [id number]  Take 2.5 mg by mouth daily.  Patient taking differently: Take 5 mg by mouth daily.   Provider, Historical, [name]  Active   amLODipine (NORVASC) 5 MG  tablet [id number]  Take 5 mg by mouth daily. Provider, Historical, [name]  Active   Cholecalciferol (VITAMIN D3 SUPER STRENGTH) 50 MCG (2000 UT) TABS [id number]  Take 2,000 Units by mouth daily. Provider, Historical, [name]  Active   Continuous Glucose Sensor (DEXCOM G7 SENSOR) MISC [id number]  APPLY 1 SENSOR TO SKIN EVERY 10 DAYS AS DIRECTED Shamleffer, Ibtehal Jaralla, [name]  Active   Cyanocobalamin  (B-12 PO) [id number]  Take 1 tablet by mouth daily. Provider, Historical, [name]  Active Self  diclofenac  Sodium (VOLTAREN ) 1 % GEL [id number]  APPLY 2 GRAMS TO RIGHT NECK/SHOULDER AREA 3-4 TIMES DAILY AS NEEDED Jesse [name], Jesse [name]  Active   DULoxetine  (CYMBALTA ) 60 MG capsule [id number]  TAKE 1 CAPSULE(60 MG) BY MOUTH DAILY Jesse [name], Jesse [name]  Active   ELIQUIS  5 MG TABS tablet [id number]  TAKE 1 TABLET(5 MG) BY MOUTH TWICE DAILY [name], [name]  Active   empagliflozin  (JARDIANCE ) 10 MG TABS tablet [id number]  Take 1 tablet (10 mg total) by mouth daily before breakfast. Thukkani, Arun K, [name]  Active   Ferrous Sulfate  (IRON PO) [id number]  Take 2 tablets by mouth daily. Provider, Historical, [name]  Active Self  guaiFENesin  (MUCINEX ) 600 MG 12 hr tablet [id number]  Take 1 tablet (  600 mg total) by mouth 2 (two) times daily.  Patient not taking: Reported on 02/04/2024   Jesse [name], Jesse [name]  Active   HYDROcodone -acetaminophen  (NORCO/VICODIN) 5-325 MG tablet [id number]  Take 1 tablet by mouth at bedtime as needed for moderate pain (pain score 4-6). Medina-Vargas, Monina [name], [name]  Active   insulin  aspart (NOVOLOG ) 100 UNIT/ML injection [id number]  Max daily 100 units Shamleffer, Ibtehal Jaralla, [name]  Active   isosorbide  mononitrate (IMDUR ) 60 MG 24 hr tablet [id number]  Take 1 tablet (60 mg total) by mouth 2 (two) times daily. Thukkani, Arun K, [name]  Active   ketoconazole  (NIZORAL ) 2 % cream [id number]  APPLY TOPICALLY TO THE AFFECTED AREA AS NEEDED FOR IRRITATION  Patient taking differently: Apply 1 Application topically daily as needed  for irritation.   Jesse [name], Jesse [name]  Active   methocarbamol  (ROBAXIN ) 500 MG tablet [id number]  TAKE 1 TABLET(500 MG) BY MOUTH EVERY 8 HOURS AS NEEDED FOR MUSCLE SPASMS Jesse [name], Jesse [name]  Active   metolazone  (ZAROXOLYN ) 2.5 MG tablet [id number]  Take 2 tabs 2 times a week prescribed by nephrologist  Patient taking differently: Take 5 mg by mouth 2 (two) times a week. Take 2 tabs 2 times a week prescribed by nephrologist   Jesse [name], Jesse [name]  Active   Multiple Vitamins-Minerals (CENTRUM ADULT PO) [id number]  Take 1 tablet by mouth daily at 6 (six) AM. Provider, Historical, [name]  Active   ondansetron  (ZOFRAN ) 4 MG tablet [id number]  TAKE 1 TABLET(4 MG) BY MOUTH EVERY 8 HOURS AS NEEDED FOR NAUSEA OR VOMITING  Patient taking differently: Take 4 mg by mouth every 8 (eight) hours as needed for nausea.   Jesse [name], Jesse [name]  Active   OXYGEN  [id number]  Inhale 2 L into the lungs continuous. Provider, Historical, [name]  Active Self  pantoprazole  (PROTONIX ) 40 MG tablet [id number]  Take 1 tablet (40 mg total) by mouth 2 (two) times daily. Medina-Vargas, Monina [name], [name]  Active   potassium chloride  SA (KLOR-CON  M) 20 MEQ tablet [id number]  Take 1 tablet (20 mEq total) by mouth 2 (two) times daily. Singh, Prashant K, [name]  Active Self  Red Yeast Rice 600 MG CAPS [phone]  Take 1,200 mg by mouth daily. Provider, Historical, [name]  Active Self  rOPINIRole  (REQUIP ) 2 MG tablet [id number]  TAKE 1 TABLET(2 MG) BY MOUTH AT BEDTIME Jesse [name], Jesse [name]  Active   rosuvastatin  (CRESTOR ) 40 MG tablet [id number]  Take 1 tablet (40 mg total) by mouth daily. Thukkani, Arun K, [name]  Active   saccharomyces boulardii (FLORASTOR) 250 MG capsule [id number]  Take 1 capsule (250 mg total) by mouth 2 (two) times daily.  Patient not taking: Reported on 02/04/2024   Jesse [name], Jesse [name]  Active   solifenacin  (VESICARE ) 5 MG tablet [id number]  Take 1 tablet (5 mg total) by mouth daily. Stoneking, Adine PARAS., [name]  Active   tirzepatide  (MOUNJARO ) 10  MG/0.5ML Pen [id number]  Inject 10 mg into the skin once a week. Shamleffer, Ibtehal Jaralla, [name]  Active   tiZANidine  (ZANAFLEX ) 2 MG tablet [id number]  TAKE 1 TABLET BY MOUTH TWICE DAILY  Patient taking differently: Take 2 mg by mouth daily in the afternoon.   Jesse [name], Jesse [name]  Active   topiramate  (TOPAMAX ) 25 MG tablet [id number]  TAKE 1 TABLET BY MOUTH EVERY DAY Jesse [name], Jesse [name]  Active   torsemide (DEMADEX) 100 MG tablet [id number]  Take 100 mg by mouth daily.  Patient takes 1/2 tablet in the morning and 1/2 tablet in the afternoon  Patient taking differently: Take 50 mg by mouth 2 (two) times daily. Patient takes 1/2 tablet in the morning and 1/2 tablet in the afternoon   Provider, Historical, [name]  Active   traMADol  (ULTRAM ) 50 MG tablet [id number]  TAKE 1 TABLET(50 MG) BY MOUTH TWICE DAILY AS NEEDED.  Patient taking differently: Take 50 mg by mouth every 12 (twelve) hours as needed. TAKE 1 TABLET(50 MG) BY MOUTH TWICE DAILY AS NEEDED   Jesse [name], Jesse [name]  Active   traZODone  (DESYREL ) 50 MG tablet [id number]  TAKE 2 TABLETS(100 MG) BY MOUTH AT BEDTIME Jesse [name], Jesse [name]  Active   triamcinolone  cream (KENALOG ) 0.1 % [id number]  Apply 1 Application topically as needed. Provider, Historical, [name]  Active   Vitamin D , Ergocalciferol , (DRISDOL ) 1.25 MG (50000 UNIT) CAPS capsule [id number]  TAKE 1 CAPSULE BY MOUTH EVERY 7 DAYS Jesse [name], Roxan BROCKS, [name]  Active   Med List Note Christie Alexander, CPhT 09/07/21 0908): Cpap 12.5 pressure            Recommendation:   Specialty provider follow-up    03/09/2024 Status: Sch   Time: 10:10 AM Length: 20  Visit Type: OFFICE VISIT SPECIALTY [753] Copay: $35.00  Provider: Sam Donell Cardinal, [name] Department: [facility]   Follow Up Plan:   Telephone follow up appointment date/time:    03/19/2024 Status: Sch   Time: 2:30 PM Length: 30  Visit Type: VBCI TELEPHONE CALL 30 [2502] Copay: $0.00  Provider: Morgan [name], RN Department:  [facility]   Clayborne Morgan RN BSN CCM [place]  [facility], [facility] Nurse Care Coordinator  Direct Dial: [phone] Website: [email or web address]

## 2024-02-12 NOTE — Patient Instructions (Signed)
 Visit Information  Thank you for taking time to visit with me today. Please don't hesitate to contact me if I can be of assistance to you before our next scheduled appointment.  Your next care management appointment is by telephone on Thursday, January 29 at 2:30 PM  Please call the care guide team at [phone] if you need to cancel, schedule, or reschedule an appointment.   Please call [phone]-TALK (toll free, 24 hour hotline) if you are experiencing a Mental Health or Behavioral Health Crisis or need someone to talk to.  Clayborne Ly RN BSN CCM [place]  [facility], [facility] Nurse Care Coordinator  Direct Dial: [phone] Website: [email or web address]

## 2024-02-15 ENCOUNTER — Encounter: Payer: Self-pay | Admitting: Internal Medicine

## 2024-02-15 DIAGNOSIS — I4819 Other persistent atrial fibrillation: Secondary | ICD-10-CM

## 2024-02-17 ENCOUNTER — Ambulatory Visit: Attending: Internal Medicine

## 2024-02-17 ENCOUNTER — Encounter: Payer: Self-pay | Admitting: Internal Medicine

## 2024-02-17 DIAGNOSIS — I4819 Other persistent atrial fibrillation: Secondary | ICD-10-CM

## 2024-02-17 NOTE — Progress Notes (Unsigned)
 Enrolled for [employer] to mail a ZIO XT long term holter monitor to the patients address on file.

## 2024-02-18 ENCOUNTER — Other Ambulatory Visit: Payer: Self-pay | Admitting: Nurse Practitioner

## 2024-02-18 DIAGNOSIS — F411 Generalized anxiety disorder: Secondary | ICD-10-CM

## 2024-02-18 NOTE — Telephone Encounter (Signed)
 Patient is requesting a refill of the following medications: Requested Prescriptions   Pending Prescriptions Disp Refills   ALPRAZolam  (XANAX ) 0.5 MG tablet [Pharmacy Med Name: ALPRAZOLAM  0.5MG  TABLETS] 60 tablet     Sig: TAKE 1 TABLET(0.5 MG) BY MOUTH TWICE DAILY AS NEEDED FOR ANXIETY OR SLEEP    Date of last refill: 12/16/23  Refill amount: 60 tablets  Treatment agreement date: 12/27/2021

## 2024-02-25 ENCOUNTER — Telehealth

## 2024-03-01 ENCOUNTER — Other Ambulatory Visit: Payer: Self-pay | Admitting: Family

## 2024-03-02 ENCOUNTER — Other Ambulatory Visit: Payer: Self-pay | Admitting: Family

## 2024-03-02 DIAGNOSIS — K219 Gastro-esophageal reflux disease without esophagitis: Secondary | ICD-10-CM

## 2024-03-02 NOTE — Telephone Encounter (Signed)
 High risk or very high risk warning populated when attempting to refill medication. RX request sent to PCP for review and approval if warranted.

## 2024-03-05 ENCOUNTER — Encounter: Payer: Self-pay | Admitting: Internal Medicine

## 2024-03-05 DIAGNOSIS — I129 Hypertensive chronic kidney disease with stage 1 through stage 4 chronic kidney disease, or unspecified chronic kidney disease: Secondary | ICD-10-CM

## 2024-03-05 MED ORDER — LOSARTAN POTASSIUM 25 MG PO TABS: 25.0000 mg | ORAL_TABLET | Freq: Every day | ORAL | 3 refills | Status: AC

## 2024-03-09 ENCOUNTER — Encounter: Payer: Self-pay | Admitting: Internal Medicine

## 2024-03-09 ENCOUNTER — Ambulatory Visit: Admitting: Internal Medicine

## 2024-03-09 ENCOUNTER — Ambulatory Visit: Payer: Self-pay | Admitting: Internal Medicine

## 2024-03-09 VITALS — BP 130/70 | HR 77 | Ht 72.0 in | Wt >= 6400 oz

## 2024-03-09 DIAGNOSIS — E1165 Type 2 diabetes mellitus with hyperglycemia: Secondary | ICD-10-CM | POA: Diagnosis not present

## 2024-03-09 DIAGNOSIS — E1122 Type 2 diabetes mellitus with diabetic chronic kidney disease: Secondary | ICD-10-CM

## 2024-03-09 DIAGNOSIS — Z794 Long term (current) use of insulin: Secondary | ICD-10-CM | POA: Diagnosis not present

## 2024-03-09 DIAGNOSIS — I4819 Other persistent atrial fibrillation: Secondary | ICD-10-CM | POA: Diagnosis not present

## 2024-03-09 DIAGNOSIS — E1142 Type 2 diabetes mellitus with diabetic polyneuropathy: Secondary | ICD-10-CM | POA: Diagnosis not present

## 2024-03-09 DIAGNOSIS — N1832 Chronic kidney disease, stage 3b: Secondary | ICD-10-CM | POA: Diagnosis not present

## 2024-03-09 DIAGNOSIS — R110 Nausea: Secondary | ICD-10-CM

## 2024-03-09 LAB — POCT GLYCOSYLATED HEMOGLOBIN (HGB A1C): Hemoglobin A1C: 7.1 % — AB (ref 4.0–5.6)

## 2024-03-09 MED ORDER — ONDANSETRON HCL 4 MG PO TABS: 4.0000 mg | ORAL_TABLET | Freq: Three times a day (TID) | ORAL | 0 refills | Status: AC | PRN

## 2024-03-09 MED ORDER — INSULIN ASPART 100 UNIT/ML IJ SOLN: INTRAMUSCULAR | 3 refills | Status: AC

## 2024-03-09 MED ORDER — TIRZEPATIDE 7.5 MG/0.5ML ~~LOC~~ SOAJ: 7.5000 mg | SUBCUTANEOUS | 3 refills | Status: AC

## 2024-03-09 NOTE — Progress Notes (Signed)
 " Name: Jesse [name]  MRN/ DOB: [id number], 11/19/1950   Age/ Sex: 74 y.o., male    PCP: Ngetich, Roxan BROCKS, NP   Reason for Endocrinology Evaluation: Type 2 Diabetes Mellitus     Date of Initial Endocrinology Visit: 10/01/2022    PATIENT IDENTIFIER: [name] is a 74 y.o. male with a past medical history of DM, OSA, chronic respiratory failure with hypoxia, CKD, CHF, paroxysmal A-fib.  And RCC ( Dx 2024) .The patient presented for initial endocrinology clinic visit on 10/01/2022 for consultative assistance with his diabetes management.    HPI: [name] was    Diagnosed with DM age 50 Prior Medications tried/Intolerance: Metformin  - no intolerant. Glimepiride - hypoglycemia         Hemoglobin A1c has ranged from 5.7% in 2023, peaking at 10.1% in 2024.  Lives in independent living    Patient was hospitalized 09/24/2022 for small bowel obstruction, of note the patient has a history of ostomy placement after ischemic transverse colon resection 2017   Patient with left abdominal hernia    Patient with congenital left foot deformity, requiring metatarsal and heel surgery Patient has noted upper extremity fatigue that is chronic in nature   On his initial visit to our clinic he had an A1c of 10.1%, he was on Jardiance , Victoza , and Lantus , I started him on glipizide , and referred him for insulin  pump training  He was started on insulin  pump 10/2022  24-hour urinary cortisol was elevated at 60.3 mcg 12/2022 , but his dexamethasone  suppression test came back normal at 1.50 through  the home nurse   He was off Jardiance  2024 with recurrent UTI's   SUBJECTIVE:   During the last visit (11/04/2023): A1c 7.2%      Today (03/09/24): Jesse [name] is here for follow-up on diabetes management.  He checks his  blood sugars multiple times daily. The patient has not  had hypoglycemic episodes since the last clinic visit.   Patient presented to urgent care in January,  2025 for acute cough.  Was prescribed doxycycline Patient continues to follow-up with cardiology for A-fib, CHF, hypercoagulable state on Eliquis .  He is s/p PPM He continues to follow-up with Dr. Dolan with nephrology   Patient was seen by the hematology clinic for anemia of chronic disease  He is s/p left renal cryoablation 11/2022 for RCC, followed by SBRT which was completed in April, 2025, patient continues to follow-[facility] with [facility] He continues to follow-up with urology due to long history of prior UTIs, Hx nephrolithiasis   Continues with urinary dysuria on initialization of urine stream  No constipation or diarrhea  Has noted nausea with higher dose of Mounjaro  especially in the morning , No heartburn    This patient with type 2 diabetes is treated with Omnipod (insulin  pump). During the visit the pump basal and bolus doses were reviewed including carb/insulin  rations and supplemental doses. The clinical list was updated. The glucose meter download was reviewed in detail to determine if the current pump settings are providing the best glycemic control without excessive hypoglycemia.  Pump and CGM download:      Pump   Omnipod  Settings   Insulin  type   Novolog     Basal rate       0000 1.7 u/h    0800 1.8          I:C ratio       0000 1:1     Enter #8 g  with each meal              Sensitivity       0000  20      Goal       0000  110            Type & Model of Pump: OmniPod Insulin  Type: Currently using NovoLog .  Body mass index is 54.93 kg/m.  PUMP STATISTICS:                HOME DIABETES REGIMEN: Mounjaro  10 mg weekly NovoLog    Statin: no ACE-I/ARB: no    DIABETIC COMPLICATIONS: Microvascular complications:  CKD III, neuropathy, minimal DR  Denies: retinopathy Last eye exam: Completed 05/2022  Macrovascular complications:   Denies: CAD, PVD, CVA   PAST HISTORY: Past Medical History:  Past Medical History:   Diagnosis Date   Alcohol use disorder in remission 1999   Anemia    iron deficiency   Anxiety and depression    Arthritis    ankles, knees and hands   At high risk for falls    Basal cell carcinoma    x4 (on both shoulders, one on forehead, one on nose)   CKD stage 3b, GFR 30-44 ml/min (HCC)    Former cigarette smoker 1999   Generalized anxiety disorder    Hard of hearing    Headache    History of kidney stones    Hyperlipidemia due to type 2 diabetes mellitus (HCC)    Hypertension    Morbid obesity (HCC)    Neuropathy    O2 dependent    2L   Presence of permanent cardiac pacemaker    Renal cell carcinoma of left kidney (HCC)    pt had cry[facility] for this at [facility] 12/11/22)   Risk for falls    Sleep apnea    uses CPAP   TIA (transient ischemic attack)    pt states this was not confirmed due to MRI unable to be performed   Type 2 diabetes mellitus (HCC)    Ventral hernia with bowel obstruction    Per Hospital Encounter on 05/21/22   Past Surgical History:  Past Surgical History:  Procedure Laterality Date   APPENDECTOMY  1980   BREAST SURGERY Left    removed part of breast due to gynecomastia   COLOSTOMY  1989   and reversal same year   COLOSTOMY  2017   FINGER ARTHRODESIS Right 02/04/2023   Procedure: RIGHT INDEX FINGER PROXIMAL INTERPHALANGEAL JOINT ARTHRODESIS;  Surgeon: [name], MD;  Location: [facility] OR;  Service: Orthopedics;  Laterality: Right;   IR THORACENTESIS RIGHT ASP PLEURAL SPACE W/IMG GUIDE  05/18/2021   IR THORACENTESIS RIGHT ASP PLEURAL SPACE W/IMG GUIDE  10/06/2021   KNEE SURGERY Right    NASAL SEPTUM SURGERY  1982   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   Procedure: PACEMAKER IMPLANT;  Surgeon: [name], MD;  Location: [facility] INVASIVE CV LAB;  Service: Cardiovascular;  Laterality: N/A;   RENAL CRYOABLATION  12/11/2022   VARICOCELECTOMY  1983    Social History:  reports that he quit smoking about 27 years ago. His smoking use included  cigarettes. He smoked an average of 1 pack per day. He has never used smokeless tobacco. He reports that he does not currently use alcohol. He reports that he does not use drugs. Family History:  Family History  Problem Relation Age of Onset   Diabetes Brother    Bipolar disorder Daughter    Anxiety  disorder Son      HOME MEDICATIONS: Allergies as of 03/09/2024       Reactions   Tape Rash   If left on for a long time        Medication List        Accurate as of March 09, 2024 10:30 AM. If you have any questions, ask your nurse or doctor.          STOP taking these medications    guaiFENesin  600 MG 12 hr tablet Commonly known as: Mucinex  Stopped by: Donell Butts, MD   saccharomyces boulardii 250 MG capsule Commonly known as: FLORASTOR Stopped by: Donell Butts, MD       TAKE these medications    rOPINIRole  2 MG tablet Commonly known as: REQUIP  TAKE 1 TABLET(2 MG) BY MOUTH AT BEDTIME The timing of this medication is very important.   acetaminophen  325 MG tablet Commonly known as: Tylenol  Take 2 tablets (650 mg total) by mouth every 6 (six) hours.   alfuzosin  10 MG 24 hr tablet Commonly known as: UROXATRAL  TAKE 1 TABLET BY MOUTH EVERY DAY   ALPRAZolam  0.5 MG tablet Commonly known as: XANAX  TAKE 1 TABLET(0.5 MG) BY MOUTH TWICE DAILY AS NEEDED FOR ANXIETY OR SLEEP   B-12 PO Take 1 tablet by mouth daily.   CENTRUM ADULT PO Take 1 tablet by mouth daily at 6 (six) AM.   Dexcom G7 Sensor Misc APPLY 1 SENSOR TO SKIN EVERY 10 DAYS AS DIRECTED   diclofenac  Sodium 1 % Gel Commonly known as: VOLTAREN  APPLY 2 GRAMS TO RIGHT NECK/SHOULDER AREA 3-4 TIMES DAILY AS NEEDED   DULoxetine  60 MG capsule Commonly known as: CYMBALTA  TAKE 1 CAPSULE(60 MG) BY MOUTH DAILY   Eliquis  5 MG Tabs tablet Generic drug: apixaban  TAKE 1 TABLET(5 MG) BY MOUTH TWICE DAILY   empagliflozin  10 MG Tabs tablet Commonly known as: JARDIANCE  Take 1 tablet (10 mg  total) by mouth daily before breakfast.   HYDROcodone -acetaminophen  5-325 MG tablet Commonly known as: NORCO/VICODIN Take 1 tablet by mouth at bedtime as needed for moderate pain (pain score 4-6).   insulin  aspart 100 UNIT/ML injection Commonly known as: novoLOG  Max daily 100 units   IRON PO Take 2 tablets by mouth daily.   isosorbide  mononitrate 60 MG 24 hr tablet Commonly known as: IMDUR  Take 1 tablet (60 mg total) by mouth 2 (two) times daily.   ketoconazole  2 % cream Commonly known as: NIZORAL  APPLY TOPICALLY TO THE AFFECTED AREA AS NEEDED FOR IRRITATION What changed: See the new instructions.   losartan  25 MG tablet Commonly known as: COZAAR  Take 1 tablet (25 mg total) by mouth daily.   methocarbamol  500 MG tablet Commonly known as: ROBAXIN  TAKE 1 TABLET(500 MG) BY MOUTH EVERY 8 HOURS AS NEEDED FOR MUSCLE SPASMS   metolazone  2.5 MG tablet Commonly known as: ZAROXOLYN  Take 2 tabs 2 times a week prescribed by nephrologist What changed:  how much to take how to take this when to take this   ondansetron  4 MG tablet Commonly known as: ZOFRAN  TAKE 1 TABLET(4 MG) BY MOUTH EVERY 8 HOURS AS NEEDED FOR NAUSEA OR VOMITING What changed: See the new instructions.   OXYGEN  Inhale 2 L into the lungs continuous.   pantoprazole  40 MG tablet Commonly known as: PROTONIX  TAKE 1 TABLET(40 MG) BY MOUTH TWICE DAILY BEFORE A MEAL   potassium chloride  SA 20 MEQ tablet Commonly known as: KLOR-CON  M Take 1 tablet (20 mEq total) by mouth 2 (  two) times daily.   Red Yeast Rice 600 MG Caps Take 1,200 mg by mouth daily.   rosuvastatin  40 MG tablet Commonly known as: CRESTOR  Take 1 tablet (40 mg total) by mouth daily.   solifenacin  5 MG tablet Commonly known as: VESICARE  Take 1 tablet (5 mg total) by mouth daily.   tirzepatide  10 MG/0.5ML Pen Commonly known as: MOUNJARO  Inject 10 mg into the skin once a week.   tiZANidine  2 MG tablet Commonly known as: ZANAFLEX  TAKE 1  TABLET BY MOUTH TWICE DAILY What changed: when to take this   topiramate  25 MG tablet Commonly known as: TOPAMAX  TAKE 1 TABLET BY MOUTH EVERY DAY   torsemide 100 MG tablet Commonly known as: DEMADEX Take 100 mg by mouth daily. Patient takes 1/2 tablet in the morning and 1/2 tablet in the afternoon What changed:  how much to take when to take this   traMADol  50 MG tablet Commonly known as: ULTRAM  TAKE 1 TABLET(50 MG) BY MOUTH TWICE DAILY AS NEEDED. What changed: See the new instructions.   traZODone  50 MG tablet Commonly known as: DESYREL  TAKE 2 TABLETS(100 MG) BY MOUTH AT BEDTIME   triamcinolone  cream 0.1 % Commonly known as: KENALOG  Apply 1 Application topically as needed.   Vitamin D  (Ergocalciferol ) 1.25 MG (50000 UNIT) Caps capsule Commonly known as: DRISDOL  TAKE 1 CAPSULE BY MOUTH EVERY 7 DAYS   Vitamin D3 Super Strength 50 MCG (2000 UT) Tabs Generic drug: Cholecalciferol Take 2,000 Units by mouth daily.         ALLERGIES: Allergies  Allergen Reactions   Tape Rash    If left on for a long time     REVIEW OF SYSTEMS: A comprehensive ROS was conducted with the patient and is negative    OBJECTIVE:   VITAL SIGNS: BP 130/70   Pulse 77   Ht 6' (1.829 m)   Wt (!) 405 lb (183.7 kg)   SpO2 97%   BMI 54.93 kg/m    PHYSICAL EXAM:  General: Pt appears well and is in NAD Patient is in a wheelchair  Lungs: Clear with good BS bilat   Heart: RRR   LE: Trace edema on the left  Neuro: MS is good with appropriate affect, pt is alert and Ox3    DM foot exam: 11/2023 per podiatry    DATA REVIEWED:  Lab Results  Component Value Date   HGBA1C 7.1 (A) 03/09/2024   HGBA1C 7.2 (H) 10/15/2023   HGBA1C 7.1 (A) 05/03/2023   Labs to [facility] 01/27/2024  Glucose 228 BUN 32 Creatinine 2.03 GFR 34 PTH 112   ASSESSMENT / PLAN / RECOMMENDATIONS:   1) Type 2 Diabetes Mellitus, optimally  controlled, With CKD III , neuropathic and retinopathic  complications - Most recent A1c of 7.1 %. Goal A1c < 7.0 %.     -A1c acceptable -He is not on Jardiance , due to recurrent and chronic UTI/prostatitis -We had to switch Victoza  to Mounjaro  due to manufacturer supply issues, he is tolerating Mounjaro , I had increased the dose of Mounjaro  from 7.5 mg weekly to 10 mg weekly on the last visit, but the patient has noted nausea and we have opted to decrease the dose again, as there has not been dramatic change in his A1c with the higher dose -Patient will enter 10 g of carbohydrates with each meal - No other changes to the pump settings  MEDICATIONS:  Decrease Mounjaro  7.5 mg weekly    Pump   Omnipod  Settings   Insulin  type   Novolog     Basal rate       0000 1.7 u/h    0800 1.8          I:C ratio       0000 1:1     Enter #10 g with each meal              Sensitivity       0000  20      Goal       0000  110         EDUCATION / INSTRUCTIONS: BG monitoring instructions: Patient is instructed to check his blood sugars 3 times a day, before meals. Call [facility] if: BG persistently < 70  I reviewed the Rule of 15 for the treatment of hypoglycemia in detail with the patient. Literature supplied.   2) Diabetic complications:  Eye: Does  have known diabetic retinopathy.  Neuro/ Feet: Does  have known diabetic peripheral neuropathy. Renal: Patient does  have known baseline CKD.     Follow-up in 6 months   Signed electronically by: Stefano Redgie Butts, MD  [facility]  [facility] [address] [place] [place], [place] [address] Phone: [phone] FAX: [phone]   CC: [name], NP [address] [place] [address] Phone: [phone]  Fax: [phone]    Return to Endocrinology clinic as below: Future Appointments  Date Time Provider Department Center  03/11/2024 11:00 AM [facility]-VASC 3 [facility]-ULTRA H&V  03/13/2024  7:35 AM CVD HVT DEVICE REMOTES  CVD-MAGST H&V  03/19/2024  2:30 PM Little, [name], RN [facility]L-POP[facility] None  04/21/2024 10:20 AM Ngetich, Roxan BROCKS, NP [facility] [address]  05/06/2024 11:20 AM Lesia Ozell Barter, PA-C [facility] H&V  06/12/2024  7:05 AM CVD HVT DEVICE REMOTES [facility] H&V  07/07/2024 11:00 AM Ngetich, [name] C, NP [facility] [address]  09/11/2024  7:05 AM CVD HVT DEVICE REMOTES [facility] H&V  12/11/2024  7:05 AM CVD HVT DEVICE REMOTES [facility] H&V  03/12/2025  7:05 AM CVD HVT DEVICE REMOTES [facility] H&V  06/11/2025  7:05 AM CVD HVT DEVICE REMOTES [facility] H&V     "

## 2024-03-09 NOTE — Patient Instructions (Addendum)
" °  Enter 10 grams  of carbohydrates with each meal  Decrease Mounjaro  7.5 mg weekly    -HOW TO TREAT LOW BLOOD SUGARS (Blood sugar LESS THAN 70 MG/DL) Please follow the RULE OF 15 for the treatment of hypoglycemia treatment (when your (blood sugars are less than 70 mg/dL)   STEP 1: Take 15 grams of carbohydrates when your blood sugar is low, which includes:  3-4 GLUCOSE TABS  OR 3-4 OZ OF JUICE OR REGULAR SODA OR ONE TUBE OF GLUCOSE GEL    STEP 2: RECHECK blood sugar in 15 MINUTES STEP 3: If your blood sugar is still low at the 15 minute recheck --> then, go back to STEP 1 and treat AGAIN with another 15 grams of carbohydrates. "

## 2024-03-10 ENCOUNTER — Ambulatory Visit: Admitting: Urology

## 2024-03-11 ENCOUNTER — Ambulatory Visit (HOSPITAL_COMMUNITY)
Admission: RE | Admit: 2024-03-11 | Discharge: 2024-03-11 | Disposition: A | Discharge status: Home / Self Care | Source: Ambulatory Visit | Attending: Internal Medicine | Admitting: Internal Medicine

## 2024-03-11 DIAGNOSIS — I5032 Chronic diastolic (congestive) heart failure: Secondary | ICD-10-CM | POA: Diagnosis present

## 2024-03-11 LAB — ECHOCARDIOGRAM COMPLETE
AR max vel: 4.23 cm2
AV Area VTI: 3.75 cm2
AV Area mean vel: 3.97 cm2
AV Mean grad: 3 mmHg
AV Peak grad: 4.7 mmHg
Ao pk vel: 1.08 m/s
MV M vel: 0.89 m/s
MV Peak grad: 3.1 mmHg
S' Lateral: 3.12 cm

## 2024-03-11 MED ORDER — PERFLUTREN LIPID MICROSPHERE
1.0000 mL | INTRAVENOUS | Status: AC | PRN
  Administered 2024-03-11: 4 mL via INTRAVENOUS

## 2024-03-13 ENCOUNTER — Ambulatory Visit: Payer: Medicare PPO

## 2024-03-13 DIAGNOSIS — I5032 Chronic diastolic (congestive) heart failure: Secondary | ICD-10-CM | POA: Diagnosis not present

## 2024-03-16 ENCOUNTER — Ambulatory Visit: Payer: Self-pay | Admitting: Cardiology

## 2024-03-16 ENCOUNTER — Other Ambulatory Visit: Payer: Self-pay | Admitting: Family

## 2024-03-16 DIAGNOSIS — G4700 Insomnia, unspecified: Secondary | ICD-10-CM

## 2024-03-16 LAB — CUP PACEART REMOTE DEVICE CHECK
Battery Remaining Longevity: 70 mo
Battery Remaining Percentage: 72 %
Battery Voltage: 2.99 V
Brady Statistic AP VP Percent: 48 %
Brady Statistic AP VS Percent: 1 %
Brady Statistic AS VP Percent: 51 %
Brady Statistic AS VS Percent: 1 %
Brady Statistic RA Percent Paced: 45 %
Brady Statistic RV Percent Paced: 99 %
Date Time Interrogation Session: 20260123020023
Implantable Lead Connection Status: 753985
Implantable Lead Connection Status: 753985
Implantable Lead Implant Date: 20230728
Implantable Lead Implant Date: 20230728
Implantable Lead Location: 753859
Implantable Lead Location: 753860
Implantable Lead Model: 3830
Implantable Pulse Generator Implant Date: 20230728
Lead Channel Impedance Value: 450 Ohm
Lead Channel Impedance Value: 510 Ohm
Lead Channel Pacing Threshold Amplitude: 0.75 V
Lead Channel Pacing Threshold Amplitude: 0.75 V
Lead Channel Pacing Threshold Pulse Width: 0.5 ms
Lead Channel Pacing Threshold Pulse Width: 0.5 ms
Lead Channel Sensing Intrinsic Amplitude: 12 mV
Lead Channel Sensing Intrinsic Amplitude: 2 mV
Lead Channel Setting Pacing Amplitude: 1.75 V
Lead Channel Setting Pacing Amplitude: 2.5 V
Lead Channel Setting Pacing Pulse Width: 0.5 ms
Lead Channel Setting Sensing Sensitivity: 2.5 mV
Pulse Gen Model: 2272
Pulse Gen Serial Number: 8101667

## 2024-03-17 NOTE — Telephone Encounter (Signed)
 High risk warning populated when attempting to refill medications, will send to provider for pending  review and approval

## 2024-03-18 DIAGNOSIS — G8929 Other chronic pain: Secondary | ICD-10-CM

## 2024-03-18 NOTE — Progress Notes (Signed)
 Remote PPM Transmission

## 2024-03-19 ENCOUNTER — Other Ambulatory Visit: Payer: Self-pay

## 2024-03-19 MED ORDER — HYDROCODONE-ACETAMINOPHEN 5-325 MG PO TABS: 1.0000 | ORAL_TABLET | Freq: Every evening | ORAL | 0 refills | Status: AC | PRN

## 2024-03-19 NOTE — Patient Instructions (Signed)
 Visit Information  Thank you for taking time to visit with me today. Please don't hesitate to contact me if I can be of assistance to you before our next scheduled appointment.  Your next care management appointment is by telephone on Thursday, March 5 at 2:30 PM  Please call the care guide team at [phone] if you need to cancel, schedule, or reschedule an appointment.   Please call [phone]-TALK (toll free, 24 hour hotline) if you are experiencing a Mental Health or Behavioral Health Crisis or need someone to talk to.  Clayborne Ly RN BSN CCM [place]  [facility], [facility] Nurse Care Coordinator  Direct Dial: [phone] Website: [email or web address]

## 2024-03-19 NOTE — Patient Outreach (Signed)
 Complex Care Management   Visit Note  03/19/2024  Name:  Jesse [name] MRN: [id number] DOB: 1950-11-17  Situation: Referral received for Complex Care Management related to Diabetes with Complications, Chronic Kidney Disease, Atrial Fibrillation, and Benign Hypertension, Chronic Congestive Heart Failure, Renal Cell Carcinoma, Colostomy, Overactive Bladder, Iron Deficiency Anemia, Fatigue, Recurrent UTI, hematuria. I obtained verbal consent from Patient.  Visit completed with Patient on the phone.  Background:   Past Medical History:  Diagnosis Date   Alcohol use disorder in remission 1999   Anemia    iron deficiency   Anxiety and depression    Arthritis    ankles, knees and hands   At high risk for falls    Basal cell carcinoma    x4 (on both shoulders, one on forehead, one on nose)   CKD stage 3b, GFR 30-44 ml/min (HCC)    Former cigarette smoker 1999   Generalized anxiety disorder    Hard of hearing    Headache    History of kidney stones    Hyperlipidemia due to type 2 diabetes mellitus (HCC)    Hypertension    Morbid obesity (HCC)    Neuropathy    O2 dependent    2L   Presence of permanent cardiac pacemaker    Renal cell carcinoma of left kidney (HCC)    pt had cryotherapy for this at [facility] 12/11/22)   Risk for falls    Sleep apnea    uses CPAP   TIA (transient ischemic attack)    pt states this was not confirmed due to MRI unable to be performed   Type 2 diabetes mellitus (HCC)    Ventral hernia with bowel obstruction    Per Hospital Encounter on 05/21/22    Assessment: Patient Reported Symptoms:  Cognitive Cognitive Status: Alert and oriented to person, place, and time, Normal speech and language skills Cognitive/Intellectual Conditions Management [RPT]: None reported or documented in medical history or problem list      Neurological Neurological Review of Symptoms: Not assessed    HEENT HEENT Symptoms Reported: Nasal discharge HEENT Management Strategies:  Routine screening, Medication therapy HEENT Self-Management Outcome: 3 (uncertain) HEENT Comment: sent message to PCP re: worsening symptoms    Cardiovascular Cardiovascular Symptoms Reported: Palpitations, Swelling in legs or feet Does patient have uncontrolled Hypertension?: No Is patient checking Blood Pressure at home?: Yes (147/74/82) Cardiovascular Management Strategies: Adequate rest, Medication therapy, Medical device, Routine screening Cardiovascular Self-Management Outcome: 4 (good)  Respiratory Respiratory Symptoms Reported: Productive cough, Shortness of breath Other Respiratory Symptoms: sent message to PCP re: recent symptoms suggestive of URI and interventions provided per Urgent Care Respiratory Management Strategies: Oxygen  therapy, CPAP, Routine screening, Medication therapy, Adequate rest Respiratory Self-Management Outcome: 4 (good)  Endocrine Endocrine Symptoms Reported: No symptoms reported Is patient diabetic?: Yes Is patient checking blood sugars at home?: Yes List most recent blood sugar readings, include date and time of day: Discussed A1C down to 7.1 Endocrine Self-Management Outcome: 4 (good) Endocrine Comment: Sent in basket message to Dr. Sam re: abnormal PTH lab recheck  Gastrointestinal Gastrointestinal Symptoms Reported: Not assessed      Genitourinary Genitourinary Symptoms Reported: No symptoms reported Genitourinary Management Strategies: Medication therapy, Fluid modification Genitourinary Self-Management Outcome: 4 (good) Genitourinary Comment: discussed patient's [facility] follow up for hx of renal cell carcimona was rescheduled by the provider  Integumentary Integumentary Symptoms Reported: Not assessed    Musculoskeletal Musculoskelatal Symptoms Reviewed: Limited mobility, Difficulty walking, Unsteady gait, Weakness Musculoskeletal Management Strategies: Medical  device, Adequate rest Musculoskeletal Self-Management Outcome: 4 (good) Falls in  the past year?: No Number of falls in past year: 1 or less Was there an injury with Fall?: No Fall Risk Category Calculator: 0 Patient Fall Risk Level: Low Fall Risk Patient at Risk for Falls Due to: Impaired balance/gait, Impaired mobility Fall risk Follow up: Falls evaluation completed  Psychosocial Psychosocial Symptoms Reported: No symptoms reported   Major Change/Loss/Stressor/Fears (CP): Medical condition, self Techniques to Cope with Loss/Stress/Change: Diversional activities (support system) Quality of Family Relationships: helpful, involved, supportive Do you feel physically threatened by others?: No    03/19/2024    PHQ2-9 Depression Screening   Sharnette Kitamura interest or pleasure in doing things    Feeling down, depressed, or hopeless    PHQ-2 - Total Score    Trouble falling or staying asleep, or sleeping too much    Feeling tired or having Conner Muegge energy    Poor appetite or overeating     Feeling bad about yourself - or that you are a failure or have let yourself or your family down    Trouble concentrating on things, such as reading the newspaper or watching television    Moving or speaking so slowly that other people could have noticed.  Or the opposite - being so fidgety or restless that you have been moving around a lot more than usual    Thoughts that you would be better off dead, or hurting yourself in some way    PHQ2-9 Total Score    If you checked off any problems, how difficult have these problems made it for you to do your work, take care of things at home, or get along with other people    Depression Interventions/Treatment      Today's Vitals   03/19/24 1537  BP: (!) 147/74  Pulse: 82   Pain Scale: Not given for pain  Medications Reviewed Today     Reviewed by Morgan [name], RN (Registered Nurse) on 03/19/24 at 1434  Med List Status: <None>   Medication Order Taking? Sig Documenting Provider Last Dose Status Informant  acetaminophen  (TYLENOL ) 325 MG  tablet [id number]  Take 2 tablets (650 mg total) by mouth every 6 (six) hours. [name], MD  Active   alfuzosin  (UROXATRAL ) 10 MG 24 hr tablet [id number]  TAKE 1 TABLET BY MOUTH EVERY DAY Ngetich, [name] C, NP  Active   ALPRAZolam  (XANAX ) 0.5 MG tablet [id number]  TAKE 1 TABLET(0.5 MG) BY MOUTH TWICE DAILY AS NEEDED FOR ANXIETY OR SLEEP Ngetich, [name] C, NP  Active   Cholecalciferol (VITAMIN D3 SUPER STRENGTH) 50 MCG (2000 UT) TABS [id number]  Take 2,000 Units by mouth daily. [provider]  Active   Continuous Glucose Sensor (DEXCOM G7 SENSOR) MISC [id number]  APPLY 1 SENSOR TO SKIN EVERY 10 DAYS AS DIRECTED Shamleffer, Ibtehal Jaralla, MD  Active   Cyanocobalamin  (B-12 PO) [id number]  Take 1 tablet by mouth daily. [provider]  Active Self  diclofenac  Sodium (VOLTAREN ) 1 % GEL [id number]  APPLY 2 GRAMS TO RIGHT NECK/SHOULDER AREA 3-4 TIMES DAILY AS NEEDED Ngetich, [name] C, NP  Active   DULoxetine  (CYMBALTA ) 60 MG capsule [id number]  TAKE 1 CAPSULE(60 MG) BY MOUTH DAILY Ngetich, [name] C, NP  Active   ELIQUIS  5 MG TABS tablet [id number]  TAKE 1 TABLET(5 MG) BY MOUTH TWICE DAILY [name], MD  Active   empagliflozin  (JARDIANCE ) 10 MG TABS tablet [id number]  Take 1 tablet (10 mg total) by  mouth daily before breakfast. Thukkani, Arun K, MD  Active   Ferrous Sulfate  (IRON PO) [id number]  Take 2 tablets by mouth daily. [provider]  Active Self  HYDROcodone -acetaminophen  (NORCO/VICODIN) 5-325 MG tablet [id number]  Take 1 tablet by mouth at bedtime as needed for moderate pain (pain score 4-6). Medina-Vargas, Monina C, NP  Active   insulin  aspart (NOVOLOG ) 100 UNIT/ML injection [id number]  Max daily 120 units Shamleffer, Ibtehal Jaralla, MD  Active   isosorbide  mononitrate (IMDUR ) 60 MG 24 hr tablet [id number]  Take 1 tablet (60 mg total) by mouth 2 (two) times daily. Thukkani, Arun K, MD  Active   ketoconazole  (NIZORAL ) 2 % cream [id number]  APPLY TOPICALLY TO THE AFFECTED AREA  AS NEEDED FOR IRRITATION  Patient taking differently: Apply 1 Application topically daily as needed for irritation.   Ngetich, [name] C, NP  Active   losartan  (COZAAR ) 25 MG tablet [id number]  Take 1 tablet (25 mg total) by mouth daily. Thukkani, Arun K, MD  Active   methocarbamol  (ROBAXIN ) 500 MG tablet [id number]  TAKE 1 TABLET(500 MG) BY MOUTH EVERY 8 HOURS AS NEEDED FOR MUSCLE SPASMS Ngetich, [name] C, NP  Active   metolazone  (ZAROXOLYN ) 2.5 MG tablet [id number]  Take 2 tabs 2 times a week prescribed by nephrologist  Patient taking differently: Take 5 mg by mouth 2 (two) times a week. Take 2 tabs 2 times a week prescribed by nephrologist   Ngetich, [name] C, NP  Active   Multiple Vitamins-Minerals (CENTRUM ADULT PO) [id number]  Take 1 tablet by mouth daily at 6 (six) AM. [provider]  Active   ondansetron  (ZOFRAN ) 4 MG tablet [id number]  Take 1 tablet (4 mg total) by mouth every 8 (eight) hours as needed for nausea or vomiting. Shamleffer, Donell Cardinal, MD  Active   OXYGEN  [id number]  Inhale 2 L into the lungs continuous. [provider]  Active Self  pantoprazole  (PROTONIX ) 40 MG tablet [id number]  TAKE 1 TABLET(40 MG) BY MOUTH TWICE DAILY BEFORE A MEAL Medina-Vargas, Monina C, NP  Active   potassium chloride  SA (KLOR-CON  M) 20 MEQ tablet [id number]  Take 1 tablet (20 mEq total) by mouth 2 (two) times daily. Singh, Prashant K, MD  Active Self  Red Yeast Rice 600 MG CAPS [phone]  Take 1,200 mg by mouth daily. [provider]  Active Self  rOPINIRole  (REQUIP ) 2 MG tablet [id number]  TAKE 1 TABLET(2 MG) BY MOUTH AT BEDTIME Ngetich, [name] C, NP  Active   rosuvastatin  (CRESTOR ) 40 MG tablet [id number]  Take 1 tablet (40 mg total) by mouth daily. Thukkani, Arun K, MD  Active   solifenacin  (VESICARE ) 5 MG tablet [id number]  Take 1 tablet (5 mg total) by mouth daily. Stoneking, Adine PARAS., MD  Active   tirzepatide  (MOUNJARO ) 7.5 MG/0.5ML Pen [id number]  Inject 7.5 mg into the skin  once a week. Shamleffer, Ibtehal Jaralla, MD  Active   tiZANidine  (ZANAFLEX ) 2 MG tablet [id number]  TAKE 1 TABLET BY MOUTH TWICE DAILY  Patient taking differently: Take 2 mg by mouth daily in the afternoon.   Ngetich, [name] C, NP  Active   topiramate  (TOPAMAX ) 25 MG tablet [id number]  TAKE 1 TABLET BY MOUTH EVERY DAY Ngetich, [name] C, NP  Active   torsemide (DEMADEX) 100 MG tablet [id number]  Take 100 mg by mouth daily. Patient takes 1/2 tablet in the morning and 1/2 tablet in the afternoon  Patient taking differently: Take 50 mg by mouth 2 (two) times  daily. Patient takes 1/2 tablet in the morning and 1/2 tablet in the afternoon   [provider]  Active   traMADol  (ULTRAM ) 50 MG tablet [id number]  TAKE 1 TABLET(50 MG) BY MOUTH TWICE DAILY AS NEEDED.  Patient taking differently: Take 50 mg by mouth every 12 (twelve) hours as needed. TAKE 1 TABLET(50 MG) BY MOUTH TWICE DAILY AS NEEDED   Ngetich, [name] C, NP  Active   traZODone  (DESYREL ) 50 MG tablet [id number]  TAKE 2 TABLETS(100 MG) BY MOUTH AT BEDTIME Ngetich, [name] C, NP  Active   triamcinolone  cream (KENALOG ) 0.1 % [id number]  Apply 1 Application topically as needed. [provider]  Active   Vitamin D , Ergocalciferol , (DRISDOL ) 1.25 MG (50000 UNIT) CAPS capsule [id number]  TAKE 1 CAPSULE BY MOUTH EVERY 7 DAYS Ngetich, Roxan BROCKS, NP  Active   Med List Note Christie Alexander, CPhT 09/07/21 0908): Cpap 12.5 pressure            Recommendation:   PCP Follow-up  04/21/2024 Status: Sch   Time: 10:20 AM Length: 20  Visit Type: OFFICE VISIT [8002] Copay: $10.00  Provider: [name], NP Department: [facility]   Specialty provider follow-up   05/07/2024 Status: Sch   Time: 2:30 PM Length: 20  Visit Type: OFFICE VISIT  Copay: $0.00  Provider: Mateo Amelie Romney, MD Department: [facility]   Follow Up Plan:   Telephone follow up appointment date/time  04/23/2024 Status: Sch   Time: 2:30 PM  Length: 30  Visit Type: [facility] TELEPHONE CALL 30 [2502] Copay: $0.00  Provider: Morgan [name], RN Department: [facility]   Clayborne Morgan RN BSN CCM [place]  [facility] Nurse Care Coordinator  Direct Dial: [phone] Website: [email or web address]

## 2024-03-19 NOTE — Telephone Encounter (Signed)
 Patient is requesting a refill of the following medications: Requested Prescriptions   Pending Prescriptions Disp Refills   HYDROcodone -acetaminophen  (NORCO/VICODIN) 5-325 MG tablet 30 tablet 0    Sig: Take 1 tablet by mouth at bedtime as needed for moderate pain (pain score 4-6).    Date of last refill:02/06/24  Refill amount: 30  Treatment agreement date: No treatment agreement on file, notation made on pending appointment March 2026

## 2024-04-21 ENCOUNTER — Ambulatory Visit: Payer: Self-pay | Admitting: Family

## 2024-04-23 ENCOUNTER — Telehealth

## 2024-05-06 ENCOUNTER — Ambulatory Visit: Admitting: Student

## 2024-06-12 ENCOUNTER — Ambulatory Visit

## 2024-07-07 ENCOUNTER — Encounter: Payer: Self-pay | Admitting: Family

## 2024-09-07 ENCOUNTER — Ambulatory Visit: Admitting: Internal Medicine

## 2024-09-11 ENCOUNTER — Ambulatory Visit

## 2024-12-11 ENCOUNTER — Ambulatory Visit

## 2025-03-12 ENCOUNTER — Ambulatory Visit

## 2025-06-11 ENCOUNTER — Ambulatory Visit
